# Patient Record
Sex: Female | Born: 1935 | Race: White | Hispanic: No | Marital: Married | Smoking: Never smoker
Health system: Southern US, Community
[De-identification: ages and names within clinical notes are randomized; demographics above are authoritative.]

## PROBLEM LIST (undated history)

## (undated) DIAGNOSIS — K449 Diaphragmatic hernia without obstruction or gangrene: Secondary | ICD-10-CM

## (undated) DIAGNOSIS — Z923 Personal history of irradiation: Secondary | ICD-10-CM

## (undated) DIAGNOSIS — I442 Atrioventricular block, complete: Secondary | ICD-10-CM

## (undated) DIAGNOSIS — F0390 Unspecified dementia without behavioral disturbance: Secondary | ICD-10-CM

## (undated) DIAGNOSIS — T8859XA Other complications of anesthesia, initial encounter: Secondary | ICD-10-CM

## (undated) DIAGNOSIS — K219 Gastro-esophageal reflux disease without esophagitis: Secondary | ICD-10-CM

## (undated) DIAGNOSIS — M1990 Unspecified osteoarthritis, unspecified site: Secondary | ICD-10-CM

## (undated) DIAGNOSIS — T4145XA Adverse effect of unspecified anesthetic, initial encounter: Secondary | ICD-10-CM

## (undated) DIAGNOSIS — Z8489 Family history of other specified conditions: Secondary | ICD-10-CM

## (undated) DIAGNOSIS — R112 Nausea with vomiting, unspecified: Secondary | ICD-10-CM

## (undated) DIAGNOSIS — M316 Other giant cell arteritis: Secondary | ICD-10-CM

## (undated) DIAGNOSIS — K649 Unspecified hemorrhoids: Secondary | ICD-10-CM

## (undated) DIAGNOSIS — K5790 Diverticulosis of intestine, part unspecified, without perforation or abscess without bleeding: Secondary | ICD-10-CM

## (undated) DIAGNOSIS — E049 Nontoxic goiter, unspecified: Secondary | ICD-10-CM

## (undated) DIAGNOSIS — Z973 Presence of spectacles and contact lenses: Secondary | ICD-10-CM

## (undated) DIAGNOSIS — I480 Paroxysmal atrial fibrillation: Secondary | ICD-10-CM

## (undated) DIAGNOSIS — K589 Irritable bowel syndrome without diarrhea: Secondary | ICD-10-CM

## (undated) DIAGNOSIS — J449 Chronic obstructive pulmonary disease, unspecified: Secondary | ICD-10-CM

## (undated) DIAGNOSIS — K222 Esophageal obstruction: Secondary | ICD-10-CM

## (undated) DIAGNOSIS — J189 Pneumonia, unspecified organism: Secondary | ICD-10-CM

## (undated) DIAGNOSIS — C50919 Malignant neoplasm of unspecified site of unspecified female breast: Secondary | ICD-10-CM

## (undated) DIAGNOSIS — C50119 Malignant neoplasm of central portion of unspecified female breast: Secondary | ICD-10-CM

## (undated) DIAGNOSIS — Z9889 Other specified postprocedural states: Secondary | ICD-10-CM

## (undated) DIAGNOSIS — J45909 Unspecified asthma, uncomplicated: Secondary | ICD-10-CM

## (undated) HISTORY — PX: RENAL CYST EXCISION: SUR506

## (undated) HISTORY — DX: Malignant neoplasm of central portion of unspecified female breast: C50.119

## (undated) HISTORY — DX: Malignant neoplasm of unspecified site of unspecified female breast: C50.919

## (undated) HISTORY — PX: CHOLECYSTECTOMY: SHX55

## (undated) HISTORY — PX: COLONOSCOPY: SHX174

## (undated) HISTORY — DX: Nontoxic goiter, unspecified: E04.9

## (undated) HISTORY — PX: HEMORRHOID SURGERY: SHX153

## (undated) HISTORY — DX: Gastro-esophageal reflux disease without esophagitis: K21.9

## (undated) HISTORY — PX: FRACTURE SURGERY: SHX138

## (undated) HISTORY — PX: BREAST BIOPSY: SHX20

## (undated) HISTORY — DX: Irritable bowel syndrome, unspecified: K58.9

## (undated) HISTORY — PX: TONSILLECTOMY: SUR1361

## (undated) HISTORY — DX: Atrioventricular block, complete: I44.2

## (undated) HISTORY — DX: Pneumonia, unspecified organism: J18.9

## (undated) HISTORY — DX: Esophageal obstruction: K22.2

## (undated) HISTORY — DX: Diaphragmatic hernia without obstruction or gangrene: K44.9

## (undated) HISTORY — PX: RECTAL POLYPECTOMY: SHX2309

## (undated) HISTORY — DX: Unspecified hemorrhoids: K64.9

## (undated) HISTORY — DX: Personal history of irradiation: Z92.3

## (undated) HISTORY — DX: Unspecified osteoarthritis, unspecified site: M19.90

## (undated) HISTORY — DX: Paroxysmal atrial fibrillation: I48.0

## (undated) HISTORY — DX: Diverticulosis of intestine, part unspecified, without perforation or abscess without bleeding: K57.90

## (undated) HISTORY — DX: Other giant cell arteritis: M31.6

## (undated) HISTORY — PX: ANAL FISSURE REPAIR: SHX2312

## (undated) HISTORY — PX: MOUTH SURGERY: SHX715

---
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
## 1994-09-30 ENCOUNTER — Encounter: Payer: Self-pay | Admitting: Pulmonary Disease

## 1997-10-19 ENCOUNTER — Encounter: Payer: Self-pay | Admitting: Family Medicine

## 1997-10-19 DIAGNOSIS — I442 Atrioventricular block, complete: Secondary | ICD-10-CM

## 1997-10-19 HISTORY — DX: Atrioventricular block, complete: I44.2

## 1997-10-19 HISTORY — PX: INSERT / REPLACE / REMOVE PACEMAKER: SUR710

## 1998-06-17 ENCOUNTER — Ambulatory Visit (HOSPITAL_COMMUNITY): Admission: RE | Admit: 1998-06-17 | Discharge: 1998-06-17 | Payer: Self-pay | Admitting: Family Medicine

## 1998-07-29 ENCOUNTER — Inpatient Hospital Stay (HOSPITAL_COMMUNITY): Admission: EM | Admit: 1998-07-29 | Discharge: 1998-08-04 | Payer: Self-pay | Admitting: *Deleted

## 1999-03-07 ENCOUNTER — Ambulatory Visit (HOSPITAL_COMMUNITY): Admission: RE | Admit: 1999-03-07 | Discharge: 1999-03-07 | Payer: Self-pay | Admitting: Orthopedic Surgery

## 1999-03-07 ENCOUNTER — Encounter: Payer: Self-pay | Admitting: Orthopedic Surgery

## 1999-05-13 ENCOUNTER — Encounter: Payer: Self-pay | Admitting: Pulmonary Disease

## 1999-05-13 ENCOUNTER — Ambulatory Visit: Admission: RE | Admit: 1999-05-13 | Discharge: 1999-05-13 | Payer: Self-pay | Admitting: Cardiology

## 1999-06-20 ENCOUNTER — Encounter: Payer: Self-pay | Admitting: Pulmonary Disease

## 1999-08-05 ENCOUNTER — Encounter: Payer: Self-pay | Admitting: Pulmonary Disease

## 2000-11-03 ENCOUNTER — Encounter: Payer: Self-pay | Admitting: Pulmonary Disease

## 2000-12-10 ENCOUNTER — Other Ambulatory Visit: Admission: RE | Admit: 2000-12-10 | Discharge: 2000-12-10 | Payer: Self-pay | Admitting: Family Medicine

## 2003-03-28 ENCOUNTER — Encounter: Admission: RE | Admit: 2003-03-28 | Discharge: 2003-03-28 | Payer: Self-pay | Admitting: Orthopedic Surgery

## 2003-03-28 ENCOUNTER — Encounter: Payer: Self-pay | Admitting: Orthopedic Surgery

## 2004-08-27 ENCOUNTER — Ambulatory Visit: Payer: Self-pay | Admitting: Family Medicine

## 2004-09-12 ENCOUNTER — Ambulatory Visit: Payer: Self-pay

## 2004-09-30 ENCOUNTER — Ambulatory Visit: Payer: Self-pay | Admitting: Cardiology

## 2004-11-08 ENCOUNTER — Ambulatory Visit: Payer: Self-pay | Admitting: Cardiology

## 2004-11-13 ENCOUNTER — Ambulatory Visit: Payer: Self-pay | Admitting: Gastroenterology

## 2004-11-14 ENCOUNTER — Ambulatory Visit: Payer: Self-pay | Admitting: Gastroenterology

## 2004-12-04 ENCOUNTER — Ambulatory Visit: Payer: Self-pay | Admitting: Cardiology

## 2004-12-30 ENCOUNTER — Ambulatory Visit: Payer: Self-pay | Admitting: Family Medicine

## 2005-01-02 ENCOUNTER — Ambulatory Visit: Payer: Self-pay | Admitting: Cardiology

## 2005-02-03 ENCOUNTER — Ambulatory Visit: Payer: Self-pay | Admitting: Cardiology

## 2005-02-27 ENCOUNTER — Ambulatory Visit: Payer: Self-pay | Admitting: Family Medicine

## 2005-03-05 ENCOUNTER — Ambulatory Visit: Payer: Self-pay | Admitting: Family Medicine

## 2005-03-13 ENCOUNTER — Ambulatory Visit: Payer: Self-pay | Admitting: Cardiology

## 2005-03-25 ENCOUNTER — Ambulatory Visit: Payer: Self-pay | Admitting: Family Medicine

## 2005-04-27 ENCOUNTER — Ambulatory Visit: Payer: Self-pay | Admitting: Cardiology

## 2005-05-14 ENCOUNTER — Ambulatory Visit: Payer: Self-pay | Admitting: Family Medicine

## 2005-05-26 ENCOUNTER — Ambulatory Visit: Payer: Self-pay | Admitting: Family Medicine

## 2005-05-27 ENCOUNTER — Ambulatory Visit: Payer: Self-pay | Admitting: Cardiology

## 2005-07-02 ENCOUNTER — Ambulatory Visit: Payer: Self-pay | Admitting: Cardiology

## 2005-07-30 ENCOUNTER — Ambulatory Visit: Payer: Self-pay | Admitting: Cardiology

## 2005-08-19 ENCOUNTER — Ambulatory Visit: Payer: Self-pay | Admitting: Family Medicine

## 2005-09-01 ENCOUNTER — Ambulatory Visit: Payer: Self-pay | Admitting: Cardiology

## 2005-09-29 ENCOUNTER — Ambulatory Visit: Payer: Self-pay | Admitting: Family Medicine

## 2005-11-18 ENCOUNTER — Ambulatory Visit: Payer: Self-pay | Admitting: Cardiology

## 2005-12-24 ENCOUNTER — Ambulatory Visit: Payer: Self-pay | Admitting: Cardiology

## 2006-02-01 ENCOUNTER — Ambulatory Visit: Payer: Self-pay | Admitting: Cardiology

## 2006-02-17 ENCOUNTER — Encounter: Payer: Self-pay | Admitting: *Deleted

## 2006-03-08 ENCOUNTER — Ambulatory Visit: Payer: Self-pay | Admitting: Cardiology

## 2006-04-12 ENCOUNTER — Ambulatory Visit: Payer: Self-pay | Admitting: Cardiology

## 2006-05-07 ENCOUNTER — Ambulatory Visit: Payer: Self-pay | Admitting: Cardiology

## 2006-07-02 ENCOUNTER — Ambulatory Visit: Payer: Self-pay | Admitting: Cardiology

## 2006-07-22 ENCOUNTER — Ambulatory Visit: Payer: Self-pay | Admitting: Internal Medicine

## 2006-07-30 ENCOUNTER — Ambulatory Visit: Payer: Self-pay | Admitting: Cardiology

## 2006-08-30 ENCOUNTER — Ambulatory Visit: Payer: Self-pay | Admitting: Cardiology

## 2006-09-27 ENCOUNTER — Ambulatory Visit: Payer: Self-pay | Admitting: Cardiology

## 2006-10-25 ENCOUNTER — Ambulatory Visit: Payer: Self-pay | Admitting: Cardiology

## 2006-11-25 ENCOUNTER — Ambulatory Visit: Payer: Self-pay | Admitting: Cardiology

## 2006-12-20 ENCOUNTER — Ambulatory Visit: Payer: Self-pay | Admitting: Cardiology

## 2007-01-17 ENCOUNTER — Ambulatory Visit: Payer: Self-pay | Admitting: Cardiology

## 2007-02-14 ENCOUNTER — Ambulatory Visit: Payer: Self-pay | Admitting: Cardiology

## 2007-03-17 ENCOUNTER — Ambulatory Visit: Payer: Self-pay | Admitting: Cardiology

## 2007-04-11 ENCOUNTER — Ambulatory Visit: Payer: Self-pay | Admitting: Cardiology

## 2007-05-09 ENCOUNTER — Ambulatory Visit: Payer: Self-pay | Admitting: Cardiology

## 2007-05-12 ENCOUNTER — Encounter: Payer: Self-pay | Admitting: Family Medicine

## 2007-05-12 DIAGNOSIS — E049 Nontoxic goiter, unspecified: Secondary | ICD-10-CM

## 2007-05-12 DIAGNOSIS — J42 Unspecified chronic bronchitis: Secondary | ICD-10-CM | POA: Insufficient documentation

## 2007-05-12 DIAGNOSIS — M159 Polyosteoarthritis, unspecified: Secondary | ICD-10-CM

## 2007-05-12 DIAGNOSIS — K589 Irritable bowel syndrome without diarrhea: Secondary | ICD-10-CM

## 2007-05-13 ENCOUNTER — Ambulatory Visit: Payer: Self-pay | Admitting: Family Medicine

## 2007-05-13 DIAGNOSIS — J3089 Other allergic rhinitis: Secondary | ICD-10-CM | POA: Insufficient documentation

## 2007-06-06 ENCOUNTER — Ambulatory Visit: Payer: Self-pay | Admitting: Cardiology

## 2007-07-04 ENCOUNTER — Ambulatory Visit: Payer: Self-pay | Admitting: Cardiology

## 2007-07-18 DIAGNOSIS — S139XXA Sprain of joints and ligaments of unspecified parts of neck, initial encounter: Secondary | ICD-10-CM

## 2007-07-25 ENCOUNTER — Ambulatory Visit: Payer: Self-pay | Admitting: Family Medicine

## 2007-07-25 LAB — CONVERTED CEMR LAB
ALT: 33 units/L (ref 0–35)
AST: 25 units/L (ref 0–37)
Alkaline Phosphatase: 183 units/L — ABNORMAL HIGH (ref 39–117)
BUN: 10 mg/dL (ref 6–23)
Basophils Absolute: 0 10*3/uL (ref 0.0–0.1)
Basophils Relative: 0.1 % (ref 0.0–1.0)
Blood in Urine, dipstick: NEGATIVE
CO2: 32 meq/L (ref 19–32)
Eosinophils Absolute: 0.2 10*3/uL (ref 0.0–0.6)
GFR calc Af Amer: 124 mL/min
Glucose, Bld: 93 mg/dL (ref 70–99)
HDL: 62.7 mg/dL (ref >39.0)
Hemoglobin: 12.2 g/dL (ref 12.0–15.0)
Neutro Abs: 7.8 10*3/uL — ABNORMAL HIGH (ref 1.4–7.7)
Neutrophils Relative %: 79.1 % — ABNORMAL HIGH (ref 43.0–77.0)
Nitrite: NEGATIVE
RBC: 4.09 M/uL (ref 3.87–5.11)
Sodium: 142 meq/L (ref 135–145)
Specific Gravity, Urine: 1.01
TSH: 0.91 microintl units/mL (ref 0.35–5.50)
Total Bilirubin: 0.7 mg/dL (ref 0.3–1.2)
Total CHOL/HDL Ratio: 3.1
Total Protein: 6.5 g/dL (ref 6.0–8.3)
Triglycerides: 69 mg/dL (ref 0–149)
VLDL: 14 mg/dL (ref 0–40)
WBC: 9.8 10*3/uL (ref 4.5–10.5)

## 2007-07-27 ENCOUNTER — Ambulatory Visit: Payer: Self-pay | Admitting: Family Medicine

## 2007-08-01 ENCOUNTER — Ambulatory Visit: Payer: Self-pay | Admitting: Cardiology

## 2007-08-05 ENCOUNTER — Ambulatory Visit: Payer: Self-pay | Admitting: Family Medicine

## 2007-08-05 ENCOUNTER — Encounter: Admission: RE | Admit: 2007-08-05 | Discharge: 2007-08-05 | Payer: Self-pay | Admitting: Family Medicine

## 2007-08-08 ENCOUNTER — Ambulatory Visit: Payer: Self-pay | Admitting: Family Medicine

## 2007-08-09 ENCOUNTER — Encounter: Admission: RE | Admit: 2007-08-09 | Discharge: 2007-10-04 | Payer: Self-pay | Admitting: Family Medicine

## 2007-08-16 ENCOUNTER — Ambulatory Visit: Payer: Self-pay | Admitting: Family Medicine

## 2007-08-29 ENCOUNTER — Ambulatory Visit: Payer: Self-pay | Admitting: Cardiology

## 2007-09-26 ENCOUNTER — Ambulatory Visit: Payer: Self-pay | Admitting: Cardiology

## 2007-12-13 ENCOUNTER — Telehealth: Payer: Self-pay | Admitting: Family Medicine

## 2008-01-05 ENCOUNTER — Ambulatory Visit: Payer: Self-pay | Admitting: Cardiology

## 2008-01-11 ENCOUNTER — Telehealth: Payer: Self-pay | Admitting: Family Medicine

## 2008-02-16 ENCOUNTER — Ambulatory Visit: Payer: Self-pay | Admitting: Cardiology

## 2008-04-16 ENCOUNTER — Ambulatory Visit: Payer: Self-pay | Admitting: Family Medicine

## 2008-04-16 DIAGNOSIS — L578 Other skin changes due to chronic exposure to nonionizing radiation: Secondary | ICD-10-CM | POA: Insufficient documentation

## 2008-04-30 ENCOUNTER — Telehealth (INDEPENDENT_AMBULATORY_CARE_PROVIDER_SITE_OTHER): Payer: Self-pay | Admitting: *Deleted

## 2008-05-10 ENCOUNTER — Ambulatory Visit: Payer: Self-pay

## 2008-05-16 ENCOUNTER — Ambulatory Visit: Payer: Self-pay | Admitting: Internal Medicine

## 2008-05-16 DIAGNOSIS — S59909A Unspecified injury of unspecified elbow, initial encounter: Secondary | ICD-10-CM

## 2008-05-16 DIAGNOSIS — S59919A Unspecified injury of unspecified forearm, initial encounter: Secondary | ICD-10-CM

## 2008-05-16 DIAGNOSIS — S2341XA Sprain of ribs, initial encounter: Secondary | ICD-10-CM

## 2008-05-16 DIAGNOSIS — S6990XA Unspecified injury of unspecified wrist, hand and finger(s), initial encounter: Secondary | ICD-10-CM

## 2008-05-17 ENCOUNTER — Ambulatory Visit: Payer: Self-pay | Admitting: Internal Medicine

## 2008-05-22 DIAGNOSIS — J069 Acute upper respiratory infection, unspecified: Secondary | ICD-10-CM | POA: Insufficient documentation

## 2008-05-25 ENCOUNTER — Ambulatory Visit: Payer: Self-pay | Admitting: Family Medicine

## 2008-06-15 ENCOUNTER — Ambulatory Visit: Payer: Self-pay | Admitting: Cardiology

## 2008-07-05 ENCOUNTER — Encounter: Payer: Self-pay | Admitting: Internal Medicine

## 2008-07-11 ENCOUNTER — Ambulatory Visit: Payer: Self-pay

## 2008-08-01 ENCOUNTER — Ambulatory Visit: Payer: Self-pay | Admitting: Cardiology

## 2008-08-01 ENCOUNTER — Ambulatory Visit: Payer: Self-pay

## 2008-08-01 LAB — CONVERTED CEMR LAB
Basophils Absolute: 0 10*3/uL (ref 0.0–0.1)
Basophils Relative: 0.4 % (ref 0.0–3.0)
Chloride: 102 meq/L (ref 96–112)
Creatinine, Ser: 0.6 mg/dL (ref 0.4–1.2)
Eosinophils Absolute: 0.2 10*3/uL (ref 0.0–0.7)
Eosinophils Relative: 3.1 % (ref 0.0–5.0)
GFR calc Af Amer: 124 mL/min
MCV: 92.4 fL (ref 78.0–100.0)
Magnesium: 2.2 mg/dL (ref 1.5–2.5)
Monocytes Relative: 11.9 % (ref 3.0–12.0)
TSH: 0.5 microintl units/mL (ref 0.35–5.50)
WBC: 5.9 10*3/uL (ref 4.5–10.5)

## 2008-08-02 ENCOUNTER — Telehealth: Payer: Self-pay | Admitting: Family Medicine

## 2008-08-02 ENCOUNTER — Ambulatory Visit: Payer: Self-pay | Admitting: Family Medicine

## 2008-09-03 ENCOUNTER — Ambulatory Visit: Payer: Self-pay | Admitting: Cardiology

## 2008-09-03 LAB — CONVERTED CEMR LAB
Basophils Absolute: 0.1 10*3/uL (ref 0.0–0.1)
CO2: 32 meq/L (ref 19–32)
Calcium: 9.3 mg/dL (ref 8.4–10.5)
Eosinophils Relative: 3 % (ref 0.0–5.0)
GFR calc Af Amer: 124 mL/min
GFR calc non Af Amer: 102 mL/min
Glucose, Bld: 80 mg/dL (ref 70–99)
HCT: 39.9 % (ref 36.0–46.0)
Hemoglobin: 13.8 g/dL (ref 12.0–15.0)
INR: 1 (ref 0.8–1.0)
Lymphocytes Relative: 22.2 % (ref 12.0–46.0)
MCHC: 34.7 g/dL (ref 30.0–36.0)
Neutro Abs: 4.4 10*3/uL (ref 1.4–7.7)
Platelets: 267 10*3/uL (ref 150–400)
RBC: 4.37 M/uL (ref 3.87–5.11)
WBC: 6.9 10*3/uL (ref 4.5–10.5)
aPTT: 30.2 s — ABNORMAL HIGH (ref 21.7–29.8)

## 2008-09-06 ENCOUNTER — Ambulatory Visit: Payer: Self-pay | Admitting: Cardiology

## 2008-09-06 ENCOUNTER — Ambulatory Visit (HOSPITAL_COMMUNITY): Admission: RE | Admit: 2008-09-06 | Discharge: 2008-09-06 | Payer: Self-pay | Admitting: Cardiology

## 2008-09-07 DIAGNOSIS — Z950 Presence of cardiac pacemaker: Secondary | ICD-10-CM

## 2008-09-07 DIAGNOSIS — I442 Atrioventricular block, complete: Secondary | ICD-10-CM

## 2008-09-17 ENCOUNTER — Ambulatory Visit: Payer: Self-pay

## 2008-09-27 ENCOUNTER — Ambulatory Visit: Payer: Self-pay | Admitting: Cardiology

## 2008-12-07 ENCOUNTER — Ambulatory Visit: Payer: Self-pay | Admitting: Family Medicine

## 2008-12-07 ENCOUNTER — Encounter: Payer: Self-pay | Admitting: *Deleted

## 2008-12-07 DIAGNOSIS — M722 Plantar fascial fibromatosis: Secondary | ICD-10-CM

## 2008-12-13 ENCOUNTER — Encounter: Payer: Self-pay | Admitting: Family Medicine

## 2008-12-14 ENCOUNTER — Ambulatory Visit: Payer: Self-pay | Admitting: Cardiology

## 2009-01-17 ENCOUNTER — Encounter: Payer: Self-pay | Admitting: Family Medicine

## 2009-01-24 ENCOUNTER — Ambulatory Visit: Payer: Self-pay | Admitting: Family Medicine

## 2009-01-28 ENCOUNTER — Encounter: Payer: Self-pay | Admitting: Family Medicine

## 2009-02-07 ENCOUNTER — Encounter (INDEPENDENT_AMBULATORY_CARE_PROVIDER_SITE_OTHER): Payer: Self-pay | Admitting: *Deleted

## 2009-03-08 ENCOUNTER — Encounter (INDEPENDENT_AMBULATORY_CARE_PROVIDER_SITE_OTHER): Payer: Self-pay | Admitting: *Deleted

## 2009-03-15 ENCOUNTER — Ambulatory Visit: Payer: Self-pay | Admitting: Cardiology

## 2009-06-14 ENCOUNTER — Ambulatory Visit: Payer: Self-pay | Admitting: Cardiology

## 2009-07-18 ENCOUNTER — Ambulatory Visit: Payer: Self-pay | Admitting: Family Medicine

## 2009-08-21 DIAGNOSIS — J449 Chronic obstructive pulmonary disease, unspecified: Secondary | ICD-10-CM

## 2009-08-22 ENCOUNTER — Ambulatory Visit: Payer: Self-pay | Admitting: Cardiology

## 2009-08-22 ENCOUNTER — Encounter: Payer: Self-pay | Admitting: Cardiology

## 2009-08-22 DIAGNOSIS — R0602 Shortness of breath: Secondary | ICD-10-CM

## 2009-08-26 ENCOUNTER — Ambulatory Visit: Payer: Self-pay | Admitting: Family Medicine

## 2009-08-30 LAB — CONVERTED CEMR LAB
Basophils Absolute: 0 10*3/uL (ref 0.0–0.1)
Basophils Relative: 0.6 % (ref 0.0–3.0)
CO2: 33 meq/L — ABNORMAL HIGH (ref 19–32)
Chloride: 101 meq/L (ref 96–112)
Eosinophils Absolute: 0.2 10*3/uL (ref 0.0–0.7)
Eosinophils Relative: 3.1 % (ref 0.0–5.0)
GFR calc non Af Amer: 101.74 mL/min (ref >60)
Hemoglobin: 13.9 g/dL (ref 12.0–15.0)
MCV: 90.2 fL (ref 78.0–100.0)
Neutro Abs: 3.7 10*3/uL (ref 1.4–7.7)
Neutrophils Relative %: 60.5 % (ref 43.0–77.0)
Pro B Natriuretic peptide (BNP): 49 pg/mL (ref 0.0–100.0)
RBC: 4.53 M/uL (ref 3.87–5.11)
RDW: 12.6 % (ref 11.5–14.6)

## 2009-09-05 ENCOUNTER — Encounter: Payer: Self-pay | Admitting: Cardiology

## 2009-09-05 ENCOUNTER — Ambulatory Visit: Payer: Self-pay

## 2009-09-05 ENCOUNTER — Ambulatory Visit: Payer: Self-pay | Admitting: Cardiology

## 2009-09-05 ENCOUNTER — Ambulatory Visit (HOSPITAL_COMMUNITY): Admission: RE | Admit: 2009-09-05 | Discharge: 2009-09-05 | Payer: Self-pay | Admitting: Cardiology

## 2009-09-20 ENCOUNTER — Ambulatory Visit: Payer: Self-pay | Admitting: Cardiology

## 2009-09-20 DIAGNOSIS — I471 Supraventricular tachycardia: Secondary | ICD-10-CM

## 2009-10-23 ENCOUNTER — Ambulatory Visit: Payer: Self-pay | Admitting: Cardiology

## 2009-11-27 ENCOUNTER — Telehealth: Payer: Self-pay | Admitting: Family Medicine

## 2010-01-17 ENCOUNTER — Ambulatory Visit: Payer: Self-pay | Admitting: Family Medicine

## 2010-01-17 LAB — CONVERTED CEMR LAB
Bilirubin Urine: NEGATIVE
Ketones, urine, test strip: NEGATIVE
Nitrite: NEGATIVE

## 2010-01-20 LAB — CONVERTED CEMR LAB
AST: 27 units/L (ref 0–37)
Albumin: 4.1 g/dL (ref 3.5–5.2)
BUN: 7 mg/dL (ref 6–23)
Bilirubin, Direct: 0 mg/dL (ref 0.0–0.3)
CO2: 31 meq/L (ref 19–32)
Calcium: 9.7 mg/dL (ref 8.4–10.5)
Eosinophils Absolute: 0.1 10*3/uL (ref 0.0–0.7)
Eosinophils Relative: 2 % (ref 0.0–5.0)
GFR calc non Af Amer: 85.07 mL/min (ref >60)
Hemoglobin: 14.4 g/dL (ref 12.0–15.0)
Lymphocytes Relative: 22.5 % (ref 12.0–46.0)
Lymphs Abs: 1.3 10*3/uL (ref 0.7–4.0)
Monocytes Absolute: 0.6 10*3/uL (ref 0.1–1.0)
Potassium: 4.2 meq/L (ref 3.5–5.1)
RBC: 4.7 M/uL (ref 3.87–5.11)
TSH: 1.29 microintl units/mL (ref 0.35–5.50)
Total Protein: 7.1 g/dL (ref 6.0–8.3)
WBC: 5.9 10*3/uL (ref 4.5–10.5)

## 2010-03-04 ENCOUNTER — Ambulatory Visit: Payer: Self-pay | Admitting: Cardiology

## 2010-06-03 ENCOUNTER — Ambulatory Visit: Payer: Self-pay | Admitting: Cardiology

## 2010-07-10 ENCOUNTER — Ambulatory Visit: Payer: Self-pay | Admitting: Family Medicine

## 2010-08-07 ENCOUNTER — Encounter: Payer: Self-pay | Admitting: Family Medicine

## 2010-08-07 DIAGNOSIS — R413 Other amnesia: Secondary | ICD-10-CM | POA: Insufficient documentation

## 2010-08-13 ENCOUNTER — Encounter: Payer: Self-pay | Admitting: Family Medicine

## 2010-08-15 ENCOUNTER — Encounter (INDEPENDENT_AMBULATORY_CARE_PROVIDER_SITE_OTHER): Payer: Self-pay | Admitting: *Deleted

## 2010-09-02 ENCOUNTER — Ambulatory Visit: Payer: Self-pay | Admitting: Cardiology

## 2010-09-16 ENCOUNTER — Ambulatory Visit: Payer: Self-pay | Admitting: Family Medicine

## 2010-09-16 DIAGNOSIS — J45901 Unspecified asthma with (acute) exacerbation: Secondary | ICD-10-CM | POA: Insufficient documentation

## 2010-09-30 ENCOUNTER — Telehealth: Payer: Self-pay | Admitting: Family Medicine

## 2010-10-07 ENCOUNTER — Telehealth: Payer: Self-pay | Admitting: Family Medicine

## 2010-10-07 ENCOUNTER — Ambulatory Visit: Payer: Self-pay | Admitting: Family Medicine

## 2010-10-14 ENCOUNTER — Encounter: Payer: Self-pay | Admitting: Cardiology

## 2010-10-14 ENCOUNTER — Ambulatory Visit: Payer: Self-pay | Admitting: Cardiology

## 2010-10-14 ENCOUNTER — Other Ambulatory Visit: Payer: Self-pay | Admitting: Cardiology

## 2010-10-14 DIAGNOSIS — I498 Other specified cardiac arrhythmias: Secondary | ICD-10-CM

## 2010-10-21 ENCOUNTER — Telehealth (INDEPENDENT_AMBULATORY_CARE_PROVIDER_SITE_OTHER): Payer: Self-pay | Admitting: *Deleted

## 2010-10-23 ENCOUNTER — Ambulatory Visit: Admit: 2010-10-23 | Payer: Self-pay | Admitting: Internal Medicine

## 2010-10-24 LAB — T3, FREE: T3, Free: 3 pg/mL (ref 2.3–4.2)

## 2010-10-24 LAB — T4, FREE: Free T4: 0.64 ng/dL (ref 0.60–1.60)

## 2010-10-28 ENCOUNTER — Ambulatory Visit
Admission: RE | Admit: 2010-10-28 | Discharge: 2010-10-28 | Payer: Self-pay | Source: Home / Self Care | Attending: Pulmonary Disease | Admitting: Pulmonary Disease

## 2010-11-03 ENCOUNTER — Telehealth: Payer: Self-pay | Admitting: Internal Medicine

## 2010-11-18 ENCOUNTER — Ambulatory Visit
Admission: RE | Admit: 2010-11-18 | Discharge: 2010-11-18 | Payer: Self-pay | Source: Home / Self Care | Attending: Pulmonary Disease | Admitting: Pulmonary Disease

## 2010-11-18 ENCOUNTER — Encounter: Payer: Self-pay | Admitting: Pulmonary Disease

## 2010-11-18 NOTE — Progress Notes (Signed)
Summary: refill  Phone Note Refill Request Message from:  Fax from Pharmacy  Refills Requested: Medication #1:  METOPROLOL SUCCINATE 100 MG XR24H-TAB take one half tab by mouth evey morning. [facility] [employer] [phone] [phone]  Initial call taken by: Warnell Forester,  November 27, 2009 8:16 AM    Prescriptions: METOPROLOL SUCCINATE 100 MG XR24H-TAB (METOPROLOL SUCCINATE) take one half tab by mouth evey morning  #60 x 3   Entered by:   Kern Reap CMA (AAMA)   Authorized by:   Roderick Pee MD   Signed by:   Kern Reap CMA (AAMA) on 11/27/2009   Method used:   Electronically to        [facility] [address] (retail)       [address]       [place], [place]  [address]       Ph: [phone]       Fax: [phone]   RxID:   [id number]

## 2010-11-18 NOTE — Miscellaneous (Signed)
Summary: dx correction  Clinical Lists Changes  Problems: Changed problem from PACEMAKER (ICD-V45.Marland Kitchen01) to PACEMAKER, PERMANENT (ICD-V45.01)  changed the incorrect dx code to correct dx code Genella Mech  August 15, 2010 1:41 PM

## 2010-11-18 NOTE — Letter (Signed)
Summary: Referral for Memory Loss  Referral for Memory Loss   Imported By: Maryln Gottron 09/03/2010 10:50:53  _____________________________________________________________________  External Attachment:    Type:   Image     Comment:   External Document

## 2010-11-18 NOTE — Cardiovascular Report (Signed)
Summary: Transtelephonic Pacemaker Monitoring Report  Transtelephonic Pacemaker Monitoring Report   Imported By: Marylou Mccoy 06/12/2010 10:24:58  _____________________________________________________________________  External Attachment:    Type:   Image     Comment:   External Document

## 2010-11-18 NOTE — Assessment & Plan Note (Signed)
Summary: ?uri/njr 3pm/njr   Vital Signs:  Patient profile:   77 year old female Height:      59.5 inches Weight:      123 pounds BMI:     24.52 Temp:     98.2 degrees F oral BP sitting:   130 / 80  (left arm) Cuff size:   regular  Vitals Entered By: Kern Reap CMA Duncan Dull) (September 16, 2010 3:14 PM) CC: sinus and chest congestion   Primary Care Provider:  Dr. Tawanna Cooler  CC:  sinus and chest congestion.  History of Present Illness: Kyia is an 77 year old, married female, nonsmoker with underlying allergic rhinitis.  He comes in today with a 4-week history of head congestion, postnasal drip, and cough that will not go away with over-the-counter antihistamines.  Review of systems otherwise negative.  Environmental review of systems negative  Allergies: 1)  ! * Cat Scan Dye 2)  ! * "strong Abx"  Past History:  Past medical, surgical, family and social histories (including risk factors) reviewed for relevance to current acute and chronic problems.  Past Medical History: Reviewed history from 09/07/2008 and no changes required. DJD IBS Goiter Chronic Bronchitis pacemaker 1999 childbirth x 4 gallbladder removal lobectomy because of bronchiectasis hemorrhoidectomy renal cyst  1. Palpitations and documented mode switches probably related to     paroxysmal atrial fibrillation. 2. Status post St. Jude Affinity DDD pacemaker implantation in 1999     for second-degree AV block with recent generator change 11/09 3. Previous pocket stimulation in a unipolar mode resolved with     bipolar pacing. 4. Chronic obstructive pulmonary disease with a partial lobectomy for     bronchiectasis and FEV-1 of 1.0.   Past Surgical History: Reviewed history from 05/16/2008 and no changes required. Pacemaker GB Lobectomy Bronchietasis left hemorrhoidectomy renal cyst  Family History: Reviewed history and no changes required.  Social History: Reviewed history from 04/16/2008 and no  changes required. Retired Married Never Smoked Alcohol use-no Drug use-no Regular exercise-yes  Review of Systems      See HPI  Physical Exam  General:  Well-developed,well-nourished,in no acute distress; alert,appropriate and cooperative throughout examination Head:  Normocephalic and atraumatic without obvious abnormalities. No apparent alopecia or balding. Eyes:  No corneal or conjunctival inflammation noted. EOMI. Perrla. Funduscopic exam benign, without hemorrhages, exudates or papilledema. Vision grossly normal. Ears:  External ear exam shows no significant lesions or deformities.  Otoscopic examination reveals clear canals, tympanic membranes are intact bilaterally without bulging, retraction, inflammation or discharge. Hearing is grossly normal bilaterally. Nose:  External nasal examination shows no deformity or inflammation. Nasal mucosa are pink and moist without lesions or exudates. Mouth:  Oral mucosa and oropharynx without lesions or exudates.  Teeth in good repair. Neck:  No deformities, masses, or tenderness noted. Chest Wall:  No deformities, masses, or tenderness noted. Breasts:  No mass, nodules, thickening, tenderness, bulging, retraction, inflamation, nipple discharge or skin changes noted.   Lungs:  symmetrical breath sounds, late expiratory wheezing on forced expiration   Problems:  Medical Problems Added: 1)  Dx of Asthma, Acute  (ZOX-096.04)  Impression & Recommendations:  Problem # 1:  ASTHMA, ACUTE (VWU-981.19) Assessment New  Her updated medication list for this problem includes:    Prednisone 20 Mg Tabs (Prednisone) ..... Uad  Orders: Prescription Created Electronically [phone])  Complete Medication List: 1)  Glucosamine  .... Take 1 tablet by mouth once a day 2)  Levsin/sl 0.125 Mg Subl (Hyoscyamine sulfate) .... As needed  3)  Metoprolol Succinate 100 Mg Xr24h-tab (Metoprolol succinate) .... Take one half tab by mouth evey morning 4)  Omega-3  350 Mg Caps (Omega-3 fatty acids) .... Once daily 5)  Citracal Petites/vitamin D 200-250 Mg-unit Tabs (Calcium citrate-vitamin d) .... Once daily 6)  Vitamin D3 2000 Unit Caps (Cholecalciferol) .... Once daily 7)  Vitamin B-12 500 Mcg Tabs (Cyanocobalamin) .... Once daily 8)  Vitamin C 500 Mg Tabs (Ascorbic acid) .... Once daily 9)  Prednisone 20 Mg Tabs (Prednisone) .... Uad  Patient Instructions: 1)  begin prednisone, take two tabs x 3 days, one x 3 days, a half x 3 days, then half a tablet Monday, Wednesday, Friday, for a two week taper.  While  taking the prednisone, you can stop the Claritin or Zyrtec.  Once u  stop the prednisone.  u may restart the antihistamine Prescriptions: PREDNISONE 20 MG TABS (PREDNISONE) UAD  #30 x 1   Entered and Authorized by:   Roderick Pee MD   Signed by:   Roderick Pee MD on 09/16/2010   Method used:   Electronically to        [facility] [address] (retail)       [address]       [place], [place]  [address]       Ph: [phone]       Fax: [phone]   RxID:   [id number]    Orders Added: 1)  Prescription Created Electronically [G8553] 2)  Est. Patient Level III [27253]

## 2010-11-18 NOTE — Assessment & Plan Note (Signed)
Summary: emp---will fast//ccm   Vital Signs:  Patient profile:   77 year old female Height:      59.5 inches Weight:      123 pounds Temp:     97.4 degrees F oral BP sitting:   110 / 80  (left arm) Cuff size:   regular  Vitals Entered By: Kern Reap CMA Duncan Dull) (January 17, 2010 9:36 AM) CC: cpx Is Patient Diabetic? No Pain Assessment Patient in pain? no        Primary Care Provider:  Dr. Tawanna Cooler  CC:  cpx.  History of Present Illness: Lauren Buchanan is 77 year old, married female, nonsmoker, who comes in today for evaluation of multiple issues.  She has a history of underlying reflux esophagitis/IBS .  She's had a GI evaluation by Dr. Russella Dar.  He put her on proton X. however, she can afford to buy it.  Advised to switch to Prilosec OTC b.i.d..  She also takes Levsin .125 q.i.d. p.r.n.  She takes a beta-blocker 50 mg daily for history of SVT and PAT.  She is in sinus rhythm.  No tachycardia arrhythmias.  She does have a history of a pacemaker followed carefully by cardiology.  She gets routine eye care.  Dental care at age 89 does not require mammography.  Colonoscopy done previously, normal.  Tetanus booster 2008 seasonal flu shot 2010 Pneumovax 1999.  We will give her a Pneumovax booster today.  Encouraged shingles vaccine her past medical history, social history, family history reviewed in detail in the been no significant changes.  She is not able walk everyday because of severe allergies.  Her mood is good.  Hearing normal ADLs.  Normal risk for fall.  Minimal.  Home safety reviewed normal height, weight, and vision, unchanged.  Appropriate laboratory data will be done today.  She has 3 children here in town.  She has done her personal healthcare power of attorney.  She also has been designated no code blue  Allergies: 1)  ! * Cat Scan Dye 2)  ! * "strong Abx"  Past History:  Past medical, surgical, family and social histories (including risk factors) reviewed, and no changes  noted (except as noted below).  Past Medical History: Reviewed history from 09/07/2008 and no changes required. DJD IBS Goiter Chronic Bronchitis pacemaker 1999 childbirth x 4 gallbladder removal lobectomy because of bronchiectasis hemorrhoidectomy renal cyst  1. Palpitations and documented mode switches probably related to     paroxysmal atrial fibrillation. 2. Status post [employer]. Jude Affinity DDD pacemaker implantation in 1999     for second-degree AV block with recent generator change 11/09 3. Previous pocket stimulation in a unipolar mode resolved with     bipolar pacing. 4. Chronic obstructive pulmonary disease with a partial lobectomy for     bronchiectasis and FEV-1 of 1.0.   Past Surgical History: Reviewed history from 05/16/2008 and no changes required. Pacemaker GB Lobectomy Bronchietasis left hemorrhoidectomy renal cyst  Family History: Reviewed history and no changes required.  Social History: Reviewed history from 04/16/2008 and no changes required. Retired Married Never Smoked Alcohol use-no Drug use-no Regular exercise-yes  Review of Systems      See HPI  Physical Exam  General:  Well-developed,well-nourished,in no acute distress; alert,appropriate and cooperative throughout examination Head:  Normocephalic and atraumatic without obvious abnormalities. No apparent alopecia or balding. Eyes:  No corneal or conjunctival inflammation noted. EOMI. Perrla. Funduscopic exam benign, without hemorrhages, exudates or papilledema. Vision grossly normal. Ears:  External ear exam  shows no significant lesions or deformities.  Otoscopic examination reveals clear canals, tympanic membranes are intact bilaterally without bulging, retraction, inflammation or discharge. Hearing is grossly normal bilaterally. Nose:  External nasal examination shows no deformity or inflammation. Nasal mucosa are pink and moist without lesions or exudates. Mouth:  Oral mucosa and  oropharynx without lesions or exudates.  Teeth in good repair. Neck:  No deformities, masses, or tenderness noted. Chest Wall:  No deformities, masses, or tenderness noted. Breasts:  No mass, nodules, thickening, tenderness, bulging, retraction, inflamation, nipple discharge or skin changes noted.   Lungs:  Normal respiratory effort, chest expands symmetrically. Lungs are clear to auscultation, no crackles or wheezes. Heart:  Normal rate and regular rhythm. S1 and S2 normal without gallop, murmur, click, rub or other extra sounds. Abdomen:  Bowel sounds positive,abdomen soft and non-tender without masses, organomegaly or hernias noted. Rectal:  No external abnormalities noted. Normal sphincter tone. No rectal masses or tenderness. Msk:  No deformity or scoliosis noted of thoracic or lumbar spine.   Pulses:  R and L carotid,radial,femoral,dorsalis pedis and posterior tibial pulses are full and equal bilaterally Extremities:  No clubbing, cyanosis, edema, or deformity noted with normal full range of motion of all joints.   Neurologic:  No cranial nerve deficits noted. Station and gait are normal. Plantar reflexes are down-going bilaterally. DTRs are symmetrical throughout. Sensory, motor and coordinative functions appear intact. Skin:  Intact without suspicious lesions or rashes Cervical Nodes:  No lymphadenopathy noted Axillary Nodes:  No palpable lymphadenopathy Inguinal Nodes:  No significant adenopathy Psych:  Cognition and judgment appear intact. Alert and cooperative with normal attention span and concentration. No apparent delusions, illusions, hallucinations   Impression & Recommendations:  Problem # 1:  SVT/ PSVT/ PAT (ICD-427.0) Assessment Improved  Her updated medication list for this problem includes:    Metoprolol Succinate 100 Mg Xr24h-tab (Metoprolol succinate) .Marland Kitchen... Take one half tab by mouth evey morning  Orders: Prescription Created Electronically [phone]) Subsequent  annual wellness visit with prevention plan (U0454) UA Dipstick w/o Micro (automated)  (81003) Venipuncture ([address]) TLB-BMP (Basic Metabolic Panel-BMET) (80048-METABOL) TLB-CBC Platelet - w/Differential (85025-CBCD) TLB-Hepatic/Liver Function Pnl (80076-HEPATIC) TLB-TSH (Thyroid Stimulating Hormone) (84443-TSH) EKG w/ Interpretation (93000)  Problem # 2:  IBS (ICD-564.1) Assessment: Improved  Orders: Prescription Created Electronically [phone]) Subsequent annual wellness visit with prevention plan (G9562) UA Dipstick w/o Micro (automated)  (81003) Venipuncture (13086) TLB-BMP (Basic Metabolic Panel-BMET) (80048-METABOL) TLB-CBC Platelet - w/Differential (85025-CBCD) TLB-Hepatic/Liver Function Pnl (80076-HEPATIC) TLB-TSH (Thyroid Stimulating Hormone) (84443-TSH)  Problem # 3:  Preventive Health Care (ICD-V70.0) Assessment: Unchanged  Orders: Prescription Created Electronically [phone]) Subsequent annual wellness visit with prevention plan (N6295) UA Dipstick w/o Micro (automated)  (81003) Venipuncture (28413) TLB-BMP (Basic Metabolic Panel-BMET) (80048-METABOL) TLB-CBC Platelet - w/Differential (85025-CBCD) TLB-Hepatic/Liver Function Pnl (80076-HEPATIC) TLB-TSH (Thyroid Stimulating Hormone) (84443-TSH)  Complete Medication List: 1)  Glucosamine  .... Take 1 tablet by mouth once a day 2)  Levsin/sl 0.125 Mg Subl (Hyoscyamine sulfate) .... As needed 3)  Metoprolol Succinate 100 Mg Xr24h-tab (Metoprolol succinate) .... Take one half tab by mouth evey morning 4)  Omega-3 350 Mg Caps (Omega-3 fatty acids) .... Once daily 5)  Citracal Petites/vitamin D 200-250 Mg-unit Tabs (Calcium citrate-vitamin d) .... Once daily 6)  Vitamin D3 2000 Unit Caps (Cholecalciferol) .... Once daily 7)  Vitamin B-12 500 Mcg Tabs (Cyanocobalamin) .... Once daily 8)  Vitamin C 500 Mg Tabs (Ascorbic acid) .... Once daily  Other Orders: Pneumococcal Vaccine (24401) Admin 1st Vaccine (02725)  Patient  Instructions: 1)  Please schedule a follow-up appointment in 1 year. 2)  I would recommend you take Prilosec and OTC 20 mg b.i.d. for your abdominal discomfort Prescriptions: METOPROLOL SUCCINATE 100 MG XR24H-TAB (METOPROLOL SUCCINATE) take one half tab by mouth evey morning  #60 x 3   Entered and Authorized by:   Roderick Pee MD   Signed by:   Roderick Pee MD on 01/17/2010   Method used:   Electronically to        [facility] [address] (retail)       [address]       [place], [place]  [address]       Ph: [phone]       Fax: [phone]   RxID:   [id number] LEVSIN/SL 0.125 MG  SUBL (HYOSCYAMINE SULFATE) as needed  #100 x 4   Entered and Authorized by:   Roderick Pee MD   Signed by:   Roderick Pee MD on 01/17/2010   Method used:   Electronically to        [facility] [address] (retail)       [address]       [place], [place]  [address]       Ph: [phone]       Fax: [phone]   RxID:   [id number]    Immunizations Administered:  Pneumonia Vaccine:    Vaccine Type: Pneumovax    Site: left deltoid    Mfr: Merck    Dose: 0.5 ml    Route: IM    Given by: Kern Reap CMA (AAMA)    Exp. Date: 09/28/2010    Lot #: [id number]    Physician counseled: yes    Laboratory Results   Urine Tests  Date/Time Recieved: January 17, 2010 12:11 PM  Date/Time Reported: January 17, 2010 12:11 PM   Routine Urinalysis   Color: yellow Appearance: Clear Glucose: negative   (Normal Range: Negative) Bilirubin: negative   (Normal Range: Negative) Ketone: negative   (Normal Range: Negative) Spec. Gravity: 1.010   (Normal Range: 1.003-1.035) Blood: trace-intact   (Normal Range: Negative) pH: 7.0   (Normal Range: 5.0-8.0) Protein: negative   (Normal Range: Negative) Urobilinogen: 0.2   (Normal Range: 0-1) Nitrite: negative   (Normal Range: Negative) Leukocyte Esterace: 2+   (Normal  Range: Negative)    Comments: Wynona Canes, CMA  January 17, 2010 12:11 PM

## 2010-11-18 NOTE — Assessment & Plan Note (Signed)
Summary: STIFFNECK/MHF   Vital Signs:  Patient Profile:   77 Years Old Female Height:     58.5 inches (148.59 cm) Weight:      120 pounds (54.55 kg) Temp:     98.2 degrees F (36.78 degrees C) oral BP sitting:   128 / 84  (left arm)  Pt. in pain?   yes    Location:   neck    Intensity:   9    Type:       aching  Vitals Entered By: Arcola Jansky, RN (July 27, 2007 11:18 AM)                  Chief Complaint:  "stiff neck for a wk and a half".  History of Present Illness: Lauren Buchanan is a 77 year old female comes in today for evaluation of left and right neck pain.  It started about 10 days ago there in [place], [place].  She slept in a different bed with a different pill and woke up the next day with a neck ache.  She denies any neurologic symptoms.  No history of trauma  Acute Visit History:      She denies abdominal pain, chest pain, constipation, cough, diarrhea, earache, eye symptoms, fever, genitourinary symptoms, headache, musculoskeletal symptoms, nasal discharge, nausea, rash, sinus problems, sore throat, and vomiting.         Current Allergies: ! * CAT SCAN DYE ! * CALCIUM SUPPLEMENTS ! * "STRONG ABX"     Review of Systems      See HPI   Physical Exam  General:     Well-developed,well-nourished,in no acute distress; alert,appropriate and cooperative throughout examination Head:     Normocephalic and atraumatic without obvious abnormalities. No apparent alopecia or balding. Msk:     No deformity or scoliosis noted of thoracic or lumbar spine.   Extremities:     No clubbing, cyanosis, edema, or deformity noted with normal full range of motion of all joints.   Neurologic:     No cranial nerve deficits noted. Station and gait are normal. Plantar reflexes are down-going bilaterally. DTRs are symmetrical throughout. Sensory, motor and coordinative functions appear intact.    Impression & Recommendations:  Problem # 1:  SPRAIN/STRAIN, NECK  (ICD-847.0)  Her updated medication list for this problem includes:    Darvocet-n 100 100-650 Mg Tabs (Propoxyphene n-apap) .Marland Kitchen... As needed    Flexeril 10 Mg Tabs (Cyclobenzaprine hcl) .Marland Kitchen... At bedtime as needed   Complete Medication List: 1)  Toprol Xl 50 Mg Tb24 (Metoprolol succinate) .... Take 1 tablet by mouth once a day 2)  Glucosamine  .... Take 1 tablet by mouth once a day 3)  Darvocet-n 100 100-650 Mg Tabs (Propoxyphene n-apap) .... As needed 4)  Zantac 150 Mg Tabs (Ranitidine hcl) .... Twice a day as needed 5)  Levsin/sl 0.125 Mg Subl (Hyoscyamine sulfate) .... As needed 6)  Protonix 40 Mg Tbec (Pantoprazole sodium) .... Take 1 tablet by mouth once a day 7)  Flexeril 10 Mg Tabs (Cyclobenzaprine hcl) .... At bedtime as needed   Patient Instructions: 1)  take a half of the Flexeril at bedtime, along with a Darvocet-N 100.  Sleep on one pillow put a heating pad under the pillow and turned on low heat.  Wear a soft cervical collar at bedtime and takes 600 mg of Motrin twice a day with food 2)  Please schedule a follow-up appointment as needed.    ]

## 2010-11-18 NOTE — Cardiovascular Report (Signed)
Summary: TTM   TTM   Imported By: Roderic Ovens 07/17/2010 16:10:36  _____________________________________________________________________  External Attachment:    Type:   Image     Comment:   External Document

## 2010-11-18 NOTE — Assessment & Plan Note (Signed)
Summary: flu shot/cjr  Nurse Visit   Allergies: 1)  ! * Cat Scan Dye 2)  ! * "strong Abx"  Orders Added: 1)  Flu Vaccine 74yrs + MEDICARE PATIENTS [Q2039] 2)  Administration Flu vaccine - MCR [G0008] Flu Vaccine Consent Questions     Do you have a history of severe allergic reactions to this vaccine? no    Any prior history of allergic reactions to egg and/or gelatin? no    Do you have a sensitivity to the preservative Thimersol? no    Do you have a past history of Guillan-Barre Syndrome? no    Do you currently have an acute febrile illness? no    Have you ever had a severe reaction to latex? no    Vaccine information given and explained to patient? yes    Are you currently pregnant? no    Lot Number:[id number]   Exp Date:04/18/2011   Site Given  Left Deltoid IM 2)  Administration Flu vaccine - MCR [G0008] .lbmedflu

## 2010-11-18 NOTE — Cardiovascular Report (Signed)
Summary: TTM   TTM   Imported By: Roderic Ovens 11/26/2009 16:06:07  _____________________________________________________________________  External Attachment:    Type:   Image     Comment:   External Document

## 2010-11-18 NOTE — Miscellaneous (Signed)
Summary: referral  Clinical Lists Changes  Problems: Added new problem of MEMORY LOSS (ICD-780.93) Orders: Added new Referral order of Neurology Referral (Neuro) - Signed

## 2010-11-20 NOTE — Miscellaneous (Signed)
Summary: [facility]   Imported By: Sherian Rein 10/31/2010 09:01:23  _____________________________________________________________________  External Attachment:    Type:   Image     Comment:   External Document

## 2010-11-20 NOTE — Progress Notes (Signed)
Summary: [facility]   Imported By: Sherian Rein 10/31/2010 08:56:53  _____________________________________________________________________  External Attachment:    Type:   Image     Comment:   External Document

## 2010-11-20 NOTE — Progress Notes (Signed)
Summary: REQUEST FOR RX (ABX)  Phone Note Call from Patient   Caller: Patient  [phone] Summary of Call: Pt called to adv that she has finished her prednisone med and is still exp sxs of productive cough (yellow-green sputum), congestion.... Pt requests that a Rx for abx be sent to  [facility] [address].... Pt was offered OV but declined stating her husband is in the hospital and she can't get into the office.... Pt can be reached at [phone] with any questions or concerns.  Initial call taken by: Debbra Riding,  September 30, 2010 8:24 AM  Follow-up for Phone Call        Fleet Contras please call,,,,,,,,, tell her to take one prednisone a day for 3 days a half for 3 days, then half a tablet every other day for two week taper and call in doxycycline 100 mg b.i.d. x 2 weeks no refill Follow-up by: Roderick Pee MD,  September 30, 2010 8:32 AM  Additional Follow-up for Phone Call Additional follow up Details #1::        tried to call line busy Additional Follow-up by: Kern Reap CMA Duncan Dull),  September 30, 2010 9:24 AM    Additional Follow-up for Phone Call Additional follow up Details #2::    spoke with patient.  called [facility] to inform them the rx was sent to the wrong pharmacy. Follow-up by: Kern Reap CMA Duncan Dull),  September 30, 2010 10:48 AM  New/Updated Medications: DOXYCYCLINE HYCLATE 100 MG CAPS (DOXYCYCLINE HYCLATE) take one cap two times a day for two weeks Prescriptions: DOXYCYCLINE HYCLATE 100 MG CAPS (DOXYCYCLINE HYCLATE) take one cap two times a day for two weeks  #14 x 0   Entered by:   Kern Reap CMA (AAMA)   Authorized by:   Roderick Pee MD   Signed by:   Kern Reap CMA (AAMA) on 09/30/2010   Method used:   Electronically to        [facility] [address] (retail)       [address]       [place], [place]  [address]       Ph: [phone]       Fax: [phone]   RxID:    [id number] DOXYCYCLINE HYCLATE 100 MG CAPS (DOXYCYCLINE HYCLATE) take one cap two times a day for two weeks  #14 x 0   Entered by:   Kern Reap CMA (AAMA)   Authorized by:   Roderick Pee MD   Signed by:   Kern Reap CMA (AAMA) on 09/30/2010   Method used:   Electronically to        [id number] (retail)       [address]       [place], [place]  [address]       Ph: [phone]       Fax: [phone]   RxID:   [id number]

## 2010-11-20 NOTE — Progress Notes (Signed)
Summary: [facility]   Imported By: Sherian Rein 10/31/2010 08:59:03  _____________________________________________________________________  External Attachment:    Type:   Image     Comment:   External Document

## 2010-11-20 NOTE — Consult Note (Signed)
Summary: [facility]   Imported By: Sherian Rein 10/31/2010 08:54:07  _____________________________________________________________________  External Attachment:    Type:   Image     Comment:   External Document

## 2010-11-20 NOTE — Cardiovascular Report (Signed)
Summary: TTM   TTM   Imported By: Roderic Ovens 10/15/2010 15:50:02  _____________________________________________________________________  External Attachment:    Type:   Image     Comment:   External Document

## 2010-11-20 NOTE — Progress Notes (Signed)
Summary: refill  Phone Note Refill Request Message from:  Patient---live call  Refills Requested: Medication #1:  PREDNISONE 20 MG TABS UAD send to [facility]  Initial call taken by: Warnell Forester,  September 30, 2010 10:58 AM    Prescriptions: PREDNISONE 20 MG TABS (PREDNISONE) UAD  #30 x 1   Entered by:   Kern Reap CMA (AAMA)   Authorized by:   Roderick Pee MD   Signed by:   Kern Reap CMA (AAMA) on 09/30/2010   Method used:   Electronically to        [facility] [address] (retail)       [address]       [place], [place]  [address]       Ph: [phone]       Fax: [phone]   RxID:   [id number]

## 2010-11-20 NOTE — Progress Notes (Signed)
Summary: appointment  Phone Note Call from Patient Call back at Home Phone [phone]   Caller: Patient Call For: DR [facility] Summary of Call: Patient phoned looks like she was last seen in 2007 she would like to be seen by Dr. Shelle Iron for breathing problems. She gets weary and having problems breathing chest xray about two weeks ago and it was negative. Dr Teddy Spike first available is 1/16 and patient would like to be seen sooner. Patient can be reached [phone] Initial call taken by: Vedia Coffer,  October 21, 2010 1:11 PM  Follow-up for Phone Call        scheduled pt to see [facility] on 1/10 at 11am--appt given to husband, pt was last seen in 2007 so appt was scheduled as a consult Follow-up by: Philipp Deputy CMA,  October 21, 2010 3:25 PM

## 2010-11-20 NOTE — Assessment & Plan Note (Signed)
Summary: Lauren Buchanan   Visit Type:  Follow-up Primary Provider:  Dr. Tawanna Cooler   History of Present Illness: The patient is 77 years old and return for management of her cardiac arrhythmia and pacemaker. In 1999 she had a St. Jude generator implanted for AV block. She had normal coronary angiography at that time. She had a generator change in 2009. She had some pocket stimulation the unipolar mode but was reprogrammed to the bipolar mode and has not had problems with this.  She has had previous episodes of SVT which was managed with metoprolol and Cardizem. She could not tolerate the Cardizem and is now only on metoprolol.  She has been doing well with her heart but she says she's had increased difficulty with cough and shortness of breath related to her pulmonary disease. She has COPD and has bronchiectasis and has had a previous lobectomy for this.    Current Medications (verified): 1)  Glucosamine .... Take 1 Tablet By Mouth Once A Day 2)  Levsin/sl 0.125 Mg  Subl (Hyoscyamine Sulfate) .... As Needed 3)  Metoprolol Succinate 100 Mg Xr24h-Tab (Metoprolol Succinate) .... Take One Half Tab By Mouth Evey Morning 4)  Omega-3 350 Mg Caps (Omega-3 Fatty Acids) .... Once Daily 5)  Citracal Petites/vitamin D 200-250 Mg-Unit Tabs (Calcium Citrate-Vitamin D) .... Once Daily 6)  Vitamin D3 2000 Unit Caps (Cholecalciferol) .... Once Daily 7)  Vitamin B-12 500 Mcg Tabs (Cyanocobalamin) .... Once Daily 8)  Vitamin C 500 Mg Tabs (Ascorbic Acid) .... Once Daily 9)  Doxycycline Hyclate 100 Mg Caps (Doxycycline Hyclate) .... Take One Cap Two Times A Day For Two Weeks  Allergies: 1)  ! * Cat Scan Dye 2)  ! * "strong Abx"  Past History:  Past Medical History: Reviewed history from 09/07/2008 and no changes required. DJD IBS Goiter Chronic Bronchitis pacemaker 1999 childbirth x 4 gallbladder removal lobectomy because of bronchiectasis hemorrhoidectomy renal cyst  1. Palpitations and documented mode  switches probably related to     paroxysmal atrial fibrillation. 2. Status post St. Jude Affinity DDD pacemaker implantation in 1999     for second-degree AV block with recent generator change 11/09 3. Previous pocket stimulation in a unipolar mode resolved with     bipolar pacing. 4. Chronic obstructive pulmonary disease with a partial lobectomy for     bronchiectasis and FEV-1 of 1.0.   Review of Systems       ROS is negative except as outlined in HPI.   Vital Signs:  Patient profile:   77 year old female Height:      59.5 inches Weight:      123 pounds Pulse rate:   106 / minute BP sitting:   100 / 60  Vitals Entered By: Laurance Flatten CMA (October 14, 2010 9:49 AM)  Physical Exam  Additional Exam:  Gen. Well-nourished, in no distress   Neck: No JVD, thyroid not enlarged, no carotid bruits Lungs: No tachypnea, clear without rales, rhonchi or wheezes, generalized decreased breath sounds Cardiovascular: Rhythm regular, PMI not displaced,  heart sounds  normal, no murmurs or gallops, no peripheral edema, pulses normal in all 4 extremities. Abdomen: BS normal, abdomen soft and non-tender without masses or organomegaly, no hepatosplenomegaly. MS: No deformities, no cyanosis or clubbing   Neuro:  No focal sns   Skin:  no lesions    PPM Specifications Following MD:  Everardo Beals. Juanda Chance, MD     PPM Vendor:  St Jude     PPM Model  Number:  [id number]     PPM Serial Number:  [id number] PPM DOI:  09/06/2008     PPM Implanting MD:  Everardo Beals. Juanda Chance, MD  Lead 1    Location: RA     DOI: 11/192009     Model #: [id number]     Serial #: [id number]     Status: active Lead 2    Location: RV     DOI: 11/192009     Model #: [id number]     Serial #: [id number]     Status: active   Indications:  BRADY   PPM Follow Up Pacer Dependent:  Yes      Episodes Coumadin:  No  Parameters Mode:  DDD     Lower Rate Limit:  60     Upper Rate Limit:  110 Paced AV Delay:  170     Sensed AV Delay:  150  Impression &  Recommendations:  Problem # 1:  PACEMAKER, PERMANENT (ICD-V45.01) She has a St. Jude DDD pacemaker implanted in 1999 for AV block with a generator change in 2009. She declined to have her pacemaker interrogated today and said "she would rather die."  She had problems with previous interrogations which caused her heart to race and she said she blacked out. On her last telephone check she had longevity greater than 10 years. She was ventricular pacing 99% of the time but did have R waves so she was not totally pacer dependent.  We will continue to follow her on phone checks. I'll arrange for her to see Dr. Johney Frame for followup of her pacemaker in one year.  Problem # 2:  SINUS TACHYCARDIA (ICD-427.89) This may be related to the flareup in her pulmonary problem. We will check a CBC and a TSH. I'm reluctant to go up on her metoprolol because of her relatively low blood pressure and sensitivity to medications. Her updated medication list for this problem includes:    Metoprolol Succinate 100 Mg Xr24h-tab (Metoprolol succinate) .Marland Kitchen... Take one half tab by mouth evey morning  Her updated medication list for this problem includes:    Metoprolol Succinate 100 Mg Xr24h-tab (Metoprolol succinate) .Marland Kitchen... Take one half tab by mouth evey morning  Problem # 3:  COPD (ICD-496) She has new symptoms of cough and shortness of breath. She has seen Dr. Tawanna Cooler for this. He is managing this problem.  Other Orders: EKG w/ Interpretation (93000) TLB-CBC Platelet - w/Differential (85025-CBCD) TLB-TSH (Thyroid Stimulating Hormone) (84443-TSH)  Patient Instructions: 1)  Labwork today: cbc/tsh (426.0). 2)  Your physician recommends that you continue on your current medications as directed. Please refer to the Current Medication list given to you today. 3)  Your physician wants you to follow-up in: 1 year with Dr. Johney Frame.  You will receive a reminder letter in the mail two months in advance. If you don't receive a letter,  please call our office to schedule the follow-up appointment.

## 2010-11-20 NOTE — Assessment & Plan Note (Signed)
Summary: chest congestion/cough/cjr   Vital Signs:  Patient profile:   77 year old female Weight:      123 pounds Temp:     98.0 degrees F oral BP sitting:   130 / 80  (left arm) Cuff size:   regular  Vitals Entered By: Kern Reap CMA Duncan Dull) (October 07, 2010 11:09 AM) CC: chest congestion   Primary Care Provider:  Dr. Tawanna Cooler  CC:  chest congestion.  History of Present Illness: Lauren Buchanan is an 77 year old, married female, nonsmoker, who comes in today for reevaluation of a cough for 3 weeks.  Three weeks ago she developed a cough.  She's had a history of underlying COPD and pneumonia.  We start her on prednisone 20 mg dose two tabs daily with a taper.  She is down to half a tablet every other day.  However, she still coughing.  She continues to take the doxycycline, 100 mg b.i.d.  Review of systems negative.  Allergies: 1)  ! * Cat Scan Dye 2)  ! * "strong Abx"  Past History:  Past medical, surgical, family and social histories (including risk factors) reviewed for relevance to current acute and chronic problems.  Past Medical History: Reviewed history from 09/07/2008 and no changes required. DJD IBS Goiter Chronic Bronchitis pacemaker 1999 childbirth x 4 gallbladder removal lobectomy because of bronchiectasis hemorrhoidectomy renal cyst  1. Palpitations and documented mode switches probably related to     paroxysmal atrial fibrillation. 2. Status post St. Jude Affinity DDD pacemaker implantation in 1999     for second-degree AV block with recent generator change 11/09 3. Previous pocket stimulation in a unipolar mode resolved with     bipolar pacing. 4. Chronic obstructive pulmonary disease with a partial lobectomy for     bronchiectasis and FEV-1 of 1.0.   Past Surgical History: Reviewed history from 05/16/2008 and no changes required. Pacemaker GB Lobectomy Bronchietasis left hemorrhoidectomy renal cyst  Family History: Reviewed history and no  changes required.  Social History: Reviewed history from 04/16/2008 and no changes required. Retired Married Never Smoked Alcohol use-no Drug use-no Regular exercise-yes  Review of Systems      See HPI  Physical Exam  General:  Well-developed,well-nourished,in no acute distress; alert,appropriate and cooperative throughout examination Head:  Normocephalic and atraumatic without obvious abnormalities. No apparent alopecia or balding. Eyes:  No corneal or conjunctival inflammation noted. EOMI. Perrla. Funduscopic exam benign, without hemorrhages, exudates or papilledema. Vision grossly normal. Ears:  External ear exam shows no significant lesions or deformities.  Otoscopic examination reveals clear canals, tympanic membranes are intact bilaterally without bulging, retraction, inflammation or discharge. Hearing is grossly normal bilaterally. Nose:  External nasal examination shows no deformity or inflammation. Nasal mucosa are pink and moist without lesions or exudates. Mouth:  Oral mucosa and oropharynx without lesions or exudates.  Teeth in good repair. Neck:  No deformities, masses, or tenderness noted. Chest Wall:  No deformities, masses, or tenderness noted. Lungs:  Normal respiratory effort, chest expands symmetrically. Lungs are clear to auscultation, no crackles or wheezes. Heart:  Normal rate and regular rhythm. S1 and S2 normal without gallop, murmur, click, rub or other extra sounds.   Problems:  Medical Problems Added: 1)  Dx of Cough  (ICD-786.2)  Impression & Recommendations:  Problem # 1:  COUGH (ICD-786.2) Assessment Deteriorated  Orders: T-2 View CXR (71020TC)  Complete Medication List: 1)  Glucosamine  .... Take 1 tablet by mouth once a day 2)  Levsin/sl 0.125 Mg Subl (  Hyoscyamine sulfate) .... As needed 3)  Metoprolol Succinate 100 Mg Xr24h-tab (Metoprolol succinate) .... Take one half tab by mouth evey morning 4)  Omega-3 350 Mg Caps (Omega-3 fatty acids)  .... Once daily 5)  Citracal Petites/vitamin D 200-250 Mg-unit Tabs (Calcium citrate-vitamin d) .... Once daily 6)  Vitamin D3 2000 Unit Caps (Cholecalciferol) .... Once daily 7)  Vitamin B-12 500 Mcg Tabs (Cyanocobalamin) .... Once daily 8)  Vitamin C 500 Mg Tabs (Ascorbic acid) .... Once daily 9)  Prednisone 20 Mg Tabs (Prednisone) .... Uad 10)  Doxycycline Hyclate 100 Mg Caps (Doxycycline hyclate) .... Take one cap two times a day for two weeks  Patient Instructions: 1)  go to the main office now for a chest x-ray.  I will call u   The report and we will discuss treatment options 2)  Prednisone one half tablet every other day for 3 weeks.  Return p.r.n.   Orders Added: 1)  Est. Patient Level III [16606] 2)  T-2 View CXR [71020TC]

## 2010-11-20 NOTE — Progress Notes (Signed)
Summary: test results  Phone Note Call from Patient Call back at Home Phone [phone]   Caller: Patient Reason for Call: Lab or Test Results Initial call taken by: Judie Grieve,  November 03, 2010 10:02 AM  Follow-up for Phone Call        Previous pt of Dr Juanda Chance that will be following with Dr Johney Frame.  Lab results were forwarded to Dr Johney Frame for review.  I will send message to [facility]. 10/14/10 labs need to be reviewed by Dr Johney Frame.  Julieta Gutting, RN, BSN  November 03, 2010 10:29 AM pt aware Dennis Bast, RN, BSN  November 03, 2010 1:05 PM

## 2010-11-20 NOTE — Assessment & Plan Note (Signed)
Summary: consult for dyspnea   Primary Provider/Referring Provider:  Dr. Tawanna Cooler  CC:  pulmonary consult.  History of Present Illness: The pt is an 77y/o female who comes in today for evaluation of dyspnea.  She apparently carries the diagnosis of bronchiectasis and copd, and has seen me in the past.  Her records will be reviewed once available.  She developed URI symptoms the end of september, with chest congestion, cough with purulence, and sore throat.  She was treated with abx and a course of prednisone, and felt much better.  Since that time, she feels she has not returned to her normal baseline, and feels she cannot move air freely.  She has minimal cough with clear mucus.  She will get winded bringing groceries in from the car, but will not get sob sweeping.  She describes a 4-5 block doe at a moderate pace on flat ground.  She denies any LE edema or chest discomfort.  She does have a h/o old granulomatous disease on cxr, and tells me she grew up in [place].  She also has had LL lobectomy for bronchiectasis.  Current Medications (verified): 1)  Glucosamine .... Take 1 Tablet By Mouth Once A Day 2)  Levsin/sl 0.125 Mg  Subl (Hyoscyamine Sulfate) .... As Needed 3)  Metoprolol Succinate 100 Mg Xr24h-Tab (Metoprolol Succinate) .... Take One Half Tab By Mouth Evey Morning 4)  Omega-3 350 Mg Caps (Omega-3 Fatty Acids) .... Once Daily 5)  Citracal Petites/vitamin D 200-250 Mg-Unit Tabs (Calcium Citrate-Vitamin D) .... Once Daily 6)  Vitamin D3 2000 Unit Caps (Cholecalciferol) .... Once Daily 7)  Vitamin B-12 500 Mcg Tabs (Cyanocobalamin) .... Once Daily 8)  Vitamin C 500 Mg Tabs (Ascorbic Acid) .... Once Daily  Allergies: 1)  ! * Cat Scan Dye 2)  ! * "strong Abx" 3)  ! Augmentin  Past History:  Past Medical History: DJD IBS Goiter pacemaker 1999 childbirth x 4 gallbladder removal Left lower lobectomy for bronchiectasis in the 1950's hemorrhoidectomy renal cyst  1. Palpitations and  documented mode switches probably related to     paroxysmal atrial fibrillation. 2. Status post St. Jude Affinity DDD pacemaker implantation in 1999     for second-degree AV block with recent generator change 11/09 3. Previous pocket stimulation in a unipolar mode resolved with     bipolar pacing. 4. Chronic obstructive pulmonary disease with a partial lobectomy for     bronchiectasis and FEV-1 of 1.0.   Past Surgical History: Pacemaker cholecystectomy approx 20 years ago Lobectomy Bronchietasis left approx 55 years ago hemorrhoidectomy renal cyst  Family History: Reviewed history and no changes required. heart disease: mother (m.i.) cancer: mother (breast) father (stomach)   Social History: Reviewed history from 04/16/2008 and no changes required. Retired Married has children Never Smoked Alcohol use-no Drug use-no Regular exercise-yes  Review of Systems       The patient complains of shortness of breath with activity, productive cough, acid heartburn, indigestion, and anxiety.  The patient denies shortness of breath at rest, non-productive cough, coughing up blood, chest pain, irregular heartbeats, loss of appetite, weight change, abdominal pain, difficulty swallowing, sore throat, tooth/dental problems, headaches, nasal congestion/difficulty breathing through nose, sneezing, itching, ear ache, depression, hand/feet swelling, joint stiffness or pain, rash, change in color of mucus, and fever.    Vital Signs:  Patient profile:   77 year old female Height:      59.5 inches Weight:      124 pounds BMI:  24.71 O2 Sat:      96 % on Room air Temp:     98.1 degrees F oral Pulse rate:   66 / minute BP sitting:   122 / 78  (left arm) Cuff size:   regular  Vitals Entered By: Arman Filter LPN (October 28, 2010 11:10 AM)  O2 Flow:  Room air CC: pulmonary consult Comments Medications reviewed with patient Arman Filter LPN  October 28, 2010 11:16 AM    Physical  Exam  General:  thin female in nad Eyes:  PERRLA and EOMI.   Nose:  patent without discharge no purulence noted. Mouth:  clear, no exudates or lesions seen Neck:  no jvd, tmg, LN Lungs:  crackles right base that are very prominent. faint left basilar crackles no wheezing Heart:  rrr with ectopics, 2/6 sem Abdomen:  soft and nontender, bs+ Extremities:  no significant edema or cyanosis  pulses intact distally Neurologic:  alert and oriented, moves all 4.   Impression & Recommendations:  Problem # 1:  SHORTNESS OF BREATH (ICD-786.05) the pt is having doe, and persistent airway symptoms that are mild after what sounds like a URI or tracheobronchitis.  She does have a history of bronchiectasis, and has some hyperinflation on her cxr.  She may have copd related to her bronchiectasis, and would like to set up for full pfts for evaluation.  If she has does indeed have significant airflow obstruction, she may benefit from maintenance bronchodilator regimen.  Other Orders: Consultation Level IV (57846) Pulmonary Referral (Pulmonary)  Patient Instructions: 1)  will setup for breathing studies, and see you back same day to discuss results.

## 2010-11-20 NOTE — Progress Notes (Signed)
Summary: ? direction and qty  Phone Note From Pharmacy Call back at [phone]   Caller: [facility] [address] Summary of Call: phar is questioning doxycycline 100 mg please verify direction and qty Initial call taken by: Heron Sabins,  September 30, 2010 11:41 AM    Prescriptions: DOXYCYCLINE HYCLATE 100 MG CAPS (DOXYCYCLINE HYCLATE) take one cap two times a day for two weeks  #28 x 0   Entered by:   Kern Reap CMA (AAMA)   Authorized by:   Roderick Pee MD   Signed by:   Kern Reap CMA (AAMA) on 09/30/2010   Method used:   Electronically to        [facility] [address] (retail)       [address]       [place], [place]  [address]       Ph: [phone]       Fax: [phone]   RxID:   [id number]

## 2010-11-20 NOTE — Progress Notes (Signed)
  Phone Note Outgoing Call   Summary of Call: I called [facility] chest x-ray normal, take a half a tablet of prednisone, Monday, Wednesday, Friday, for 3 weeks.  Return in 3 weeks if not improved, sooner if any problems or you get worse Initial call taken by: Roderick Pee MD,  October 07, 2010 2:06 PM

## 2010-11-26 NOTE — Miscellaneous (Signed)
Summary: Orders Update pft charges   Clinical Lists Changes  Orders: Added new Service order of Carbon Monoxide diffusing w/capacity (94729) - Signed Added new Service order of Lung Volumes/Gas dilution or washout (94727) - Signed Added new Service order of Spirometry (Pre & Post) (94060) - Signed 

## 2010-11-26 NOTE — Assessment & Plan Note (Signed)
Summary: rov for bronchiectasis with mild airflow obstruction.   Primary Provider/Referring Provider:  Dr. Tawanna Cooler  CC:  Ov to discuss PFT results. .  History of Present Illness: the pt comes in today for f/u of her pfts.  She was found to have mild airflow obstruction, no restriction, mild airtrapping, and a minimal decrease in DLCO.  I have reviewed the results with her in detail, and answered all of her questions.  Currently, she feels she is doing well and back to her usual baseline.  She is satisfied with how she feels currently.  Medications Prior to Update: 1)  Glucosamine .... Take 1 Tablet By Mouth Once A Day 2)  Levsin/sl 0.125 Mg  Subl (Hyoscyamine Sulfate) .... As Needed 3)  Metoprolol Succinate 100 Mg Xr24h-Tab (Metoprolol Succinate) .... Take One Half Tab By Mouth Evey Morning 4)  Omega-3 350 Mg Caps (Omega-3 Fatty Acids) .... Once Daily 5)  Citracal Petites/vitamin D 200-250 Mg-Unit Tabs (Calcium Citrate-Vitamin D) .... Once Daily 6)  Vitamin D3 2000 Unit Caps (Cholecalciferol) .... Once Daily 7)  Vitamin B-12 500 Mcg Tabs (Cyanocobalamin) .... Once Daily 8)  Vitamin C 500 Mg Tabs (Ascorbic Acid) .... Once Daily  Allergies (verified): 1)  ! * Cat Scan Dye 2)  ! * "strong Abx" 3)  ! Augmentin 4)  ! Claritin  Past History:  Past medical, surgical, family and social histories (including risk factors) reviewed, and no changes noted (except as noted below).  Past Medical History: DJD IBS Goiter pacemaker 1999 childbirth x 4 gallbladder removal Left lower lobectomy for bronchiectasis in the 1950's hemorrhoidectomy renal cyst  1. Palpitations and documented mode switches probably related to     paroxysmal atrial fibrillation. 2. Status post St. Jude Affinity DDD pacemaker implantation in 1999     for second-degree AV block with recent generator change 11/09 3. Previous pocket stimulation in a unipolar mode resolved with     bipolar pacing. 4. Chronic obstructive  pulmonary disease with a partial lobectomy for     bronchiectasis and FEV-1 of 1.27 with ratio 66 11/18/10.  Past Surgical History: Reviewed history from 10/28/2010 and no changes required. Pacemaker cholecystectomy approx 20 years ago Lobectomy Bronchietasis left approx 55 years ago hemorrhoidectomy renal cyst  Family History: Reviewed history from 10/28/2010 and no changes required. heart disease: mother (m.i.) cancer: mother (breast) father (stomach)   Social History: Reviewed history from 10/28/2010 and no changes required. Retired Married has children Never Smoked Alcohol use-no Drug use-no Regular exercise-yes  Review of Systems       The patient complains of productive cough, acid heartburn, indigestion, and loss of appetite.  The patient denies shortness of breath with activity, shortness of breath at rest, non-productive cough, coughing up blood, chest pain, irregular heartbeats, weight change, abdominal pain, difficulty swallowing, sore throat, tooth/dental problems, headaches, nasal congestion/difficulty breathing through nose, sneezing, itching, ear ache, anxiety, depression, hand/feet swelling, joint stiffness or pain, rash, change in color of mucus, and fever.    Vital Signs:  Patient profile:   77 year old female Height:      60 inches Weight:      124 pounds BMI:     24.30 O2 Sat:      100 % on Room air Temp:     97.6 degrees F oral Pulse rate:   67 / minute BP sitting:   138 / 76  (left arm) Cuff size:   regular  Vitals Entered By: Arman Filter LPN (November 18, 2010 11:52 AM)  O2 Flow:  Room air CC: Ov to discuss PFT results.  Comments Medications reviewed with patient Arman Filter LPN  November 18, 2010 11:56 AM    Physical Exam  General:  thin female in nad Lungs:  clear except faint bibasilar crackles. Extremities:  no significant edema or cyanosis  Neurologic:  alert and oriented, moves all 4.   Impression & Recommendations:  Problem  # 1:  COPD, MILD (ICD-496) the pt has mild airflow obstruction by her pfts today, most likely due to her underlying bronchiectasis.  She feels that she is now back to baseline wrt her breathing, and is really not interested in staying on maintenance meds.  I am ok with that as long as she is not having frequent exacerbations of her bronchiectasis, or having issues with sob that require overuse of rescue inhaler.  I have discussed all of this with her, and she voices understanding.  Will see her back in 6mos to see how things are going.  Medications Added to Medication List This Visit: 1)  Proair Hfa 108 (90 Base) Mcg/act Aers (Albuterol sulfate) .... 2 puffs every 6 hours as needed  Other Orders: Est. Patient Level III (16109)  Patient Instructions: 1)  keep albuterol on hand to use as needed when you get short of breath or having chest symptoms.  2 puffs up to every 6hrs if needed. 2)  followup with me in 6mos to check on how things are going.   Prescriptions: PROAIR HFA 108 (90 BASE) MCG/ACT  AERS (ALBUTEROL SULFATE) 2 puffs every 6 hours as needed  #1 x 6   Entered and Authorized by:   Barbaraann Share MD   Signed by:   Barbaraann Share MD on 11/18/2010   Method used:   Print then Give to Patient   RxID:   [id number]

## 2010-12-02 ENCOUNTER — Encounter: Payer: Self-pay | Admitting: Internal Medicine

## 2010-12-02 DIAGNOSIS — I441 Atrioventricular block, second degree: Secondary | ICD-10-CM

## 2010-12-12 ENCOUNTER — Other Ambulatory Visit: Payer: Self-pay | Admitting: Dermatology

## 2010-12-30 NOTE — Cardiovascular Report (Signed)
Summary: TTM   TTM   Imported By: Roderic Ovens 12/22/2010 15:05:58  _____________________________________________________________________  External Attachment:    Type:   Image     Comment:   External Document

## 2011-02-11 ENCOUNTER — Ambulatory Visit (INDEPENDENT_AMBULATORY_CARE_PROVIDER_SITE_OTHER): Payer: Medicare Other

## 2011-02-11 ENCOUNTER — Inpatient Hospital Stay (INDEPENDENT_AMBULATORY_CARE_PROVIDER_SITE_OTHER)
Admission: RE | Admit: 2011-02-11 | Discharge: 2011-02-11 | Disposition: A | Discharge status: Home / Self Care | Payer: Medicare Other | Source: Ambulatory Visit | Attending: Family Medicine | Admitting: Family Medicine

## 2011-02-11 DIAGNOSIS — S52599A Other fractures of lower end of unspecified radius, initial encounter for closed fracture: Secondary | ICD-10-CM

## 2011-02-11 DIAGNOSIS — S52609A Unspecified fracture of lower end of unspecified ulna, initial encounter for closed fracture: Secondary | ICD-10-CM

## 2011-02-17 ENCOUNTER — Observation Stay (HOSPITAL_COMMUNITY)
Admission: AD | Admit: 2011-02-17 | Discharge: 2011-02-18 | Disposition: A | Discharge status: Home / Self Care | Payer: Medicare Other | Source: Ambulatory Visit | Attending: Orthopedic Surgery | Admitting: Orthopedic Surgery

## 2011-02-17 DIAGNOSIS — Z950 Presence of cardiac pacemaker: Secondary | ICD-10-CM

## 2011-02-17 DIAGNOSIS — S52599A Other fractures of lower end of unspecified radius, initial encounter for closed fracture: Secondary | ICD-10-CM | POA: Insufficient documentation

## 2011-02-17 DIAGNOSIS — G8918 Other acute postprocedural pain: Secondary | ICD-10-CM | POA: Insufficient documentation

## 2011-02-17 DIAGNOSIS — R110 Nausea: Principal | ICD-10-CM | POA: Insufficient documentation

## 2011-02-24 NOTE — Discharge Summary (Signed)
  Lauren Buchanan, CABEZAS NO.:  [id number]  MEDICAL RECORD NO.:  [id number]           PATIENT TYPE:  O  LOCATION:  3732                         FACILITY:  [facility]  PHYSICIAN:  Madelynn Done, MD  DATE OF BIRTH:  [birth year]  DATE OF ADMISSION:  02/17/2011 DATE OF DISCHARGE:  02/18/2011                              DISCHARGE SUMMARY   CONSULTING PHYSICIANS:  [facility].  CONDITION ON DISCHARGE:  Good.  REASON FOR ADMISSION:  Nausea and IV pain medication.  DISCHARGE DIAGNOSIS: 1. Left wrist distal radius fracture. 2. Nausea.  PROCEDURES AND DATES:  None.  REASON FOR ADMISSION:  The patient is an 76 year old female who underwent open reduction and internal fixation of displaced distal radius fracture on February 16, 2011, and was admitted to [facility] for IV antibiotics and pain control in the postoperative period.  HOSPITAL COURSE:  The patient was admitted to the monitored bed after being seen by Cardiology.  She was seen by [facility] who felt pacemaker was functioning well.  The patient was seen and evaluated in the hospital.  She was tolerating a better diet.  Her pain has been controlled on p.o. pain medications and felt ready to discharge to home. On the day of discharge, the patient's discharge instructions were explained to her, questions were answered.  The patient voiced understanding the discharge plan.  CONDITION ON DISCHARGE:  Good.  DISCHARGE PLAN:  The patient will be discharged home, will be seen back in the office in approximately 10-14 days, wound check and suture removal, application of cast, use of the left upper extremity, ice and elevation.  Should she have any problem, she is to contact the office. The patient voiced understanding the plan on discharge, no use of the left arm.     Madelynn Done, MD    FWO/MEDQ  D:  02/17/2011  T:  02/18/2011  Job:  [id number]  Electronically Signed by Bradly Bienenstock IV MD on  02/24/2011 09:07:47 AM

## 2011-02-24 NOTE — H&P (Signed)
  [name], Lauren Buchanan NO.:  [id number]  MEDICAL RECORD NO.:  [id number]           PATIENT TYPE:  O  LOCATION:  3732                         FACILITY:  [facility]  PHYSICIAN:  Madelynn Done, MD  DATE OF BIRTH:  [birth year]  DATE OF ADMISSION:  02/17/2011 DATE OF DISCHARGE:                             HISTORY & PHYSICAL   REASON FOR ADMISSION:  Pain control and nausea.  ADMISSION DIAGNOSES: 1. Left distal radius fracture. 2. Nausea. 3. Pain control.  PROCEDURES:  None.  CONSULTING PHYSICIANS:  [facility].  BRIEF HISTORY:  [name] is an 77 year old female who underwent open reduction and internal fixation of a displaced left distal radius fracture yesterday at [facility].  The patient was admitted overnight, but she was having pain, requiring IV pain medication and nausea.  Based on her level of symptoms, she was transferred to [facility] for admission.  Also, the anesthesiologist was concerned about her pacemaker.  They asked then that she see the cardiologist in the hospital.  The patient is being admitted for the pain control and antiemetics as well as to be seen by Cardiology.  The patient voiced understanding of reason for the admission.  She in the postoperative period for her left wrist surgery which was performed on February 16, 2011.  All questions were answered and encouraged from [facility] upon admission.  [name] had voiced understanding of the plan and the reason for admission.     Madelynn Done, MD     FWO/MEDQ  D:  02/17/2011  T:  02/18/2011  Job:  [id number]  Electronically Signed by Bradly Bienenstock IV MD on 02/24/2011 09:07:50 AM

## 2011-03-02 NOTE — Consult Note (Signed)
  NAMEAVALEE, CASTRELLON NO.:  [id number]  MEDICAL RECORD NO.:  [id number]           PATIENT TYPE:  O  LOCATION:  3732                         FACILITY:  [facility]  PHYSICIAN:  Cassell Clement, M.D. DATE OF BIRTH:  Jul 10, 1925  DATE OF CONSULTATION:  02/17/2011 DATE OF DISCHARGE:                                CONSULTATION   We are asked to see this 77 year old woman in regard to possible pacemaker malfunction.  The patient is postoperative left wrist surgery. She has a pacemaker which was implanted initially in 1999 for high-grade AV block.  She had a pacemaker generator replacement by Dr. Charlies Constable on September 06, 2008.  She has a St. Jude Zephyr XL DR dual-chamber pacemaker.  Clinically, the patient has done well postoperatively.  She has been on telemetry and she has been noted on telemetry to have runs of paired ventricular complexes.  She has not been experiencing any symptoms of chest pain, dizziness, or palpitations.  PHYSICAL EXAMINATION:  VITAL SIGNS:  Stable.  Her blood pressure is 119/72, her pulse is 97 and slightly irregular, her temperature is 98. NECK:  Her jugular venous pressure is normal.  The carotids have a normal upstroke. LUNGS:  Clear to percussion and auscultation. HEART:  A grade 2/6 systolic ejection murmur at the base.  The pacer site in the left upper chest is well healed.  Telemetry shows what appears to be normal sinus rhythm with premature atrial beats and normal ventricular pacing.  However, we do want to have the pacemaker interrogated by the [facility]. Jude representative to be sure there is nothing else going on.  IT does appear that the pacemaker itself appears to be functioning normally.  DISPOSITION:  Continue current medication.  We have asked [employer] Jude representative to interrogate the pacemaker.          ______________________________ Cassell Clement, M.D.     TB/MEDQ  D:  02/17/2011  T:  02/18/2011  Job:   [id number]  cc:   Madelynn Done, MD  Electronically Signed by Cassell Clement M.D. on 03/02/2011 04:43:50 AM

## 2011-03-03 ENCOUNTER — Encounter: Payer: Self-pay | Admitting: Internal Medicine

## 2011-03-03 DIAGNOSIS — I441 Atrioventricular block, second degree: Secondary | ICD-10-CM

## 2011-03-03 NOTE — Assessment & Plan Note (Signed)
[facility]                            CARDIOLOGY OFFICE NOTE   LINDELL, TUSSEY                     MRN:          [id number]  DATE:09/27/2008                            DOB:          1925/05/21    PRIMARY CARE PHYSICIAN:  Tinnie Gens A. Tawanna Cooler, MD   CLINICAL HISTORY:  Ms. Bartelson is a 77 year old and returned for a  followup visit after a recent generator change.  She had a St. Jude  Affinity DDD pacemaker implanted in 1999 for a high-degree AV block.  At  that time, she had torsade ventricular tachycardia associated with  bradycardia prior to a pacer implantation.  She underwent coronary  angiography which was normal.  She recently reached ERI, and we brought  her in for a generator change.  A new St. Jude Zephyr was implanted.  She relates that she had feeling of rather intense shortness of breath,  and she is being interrogated prior to her new generator implanted.   Since she has gone home, she has done fairly well.  She does say that  when she swoops down and gets up, she has some shortness of breath which  she did not have with her previous pacemaker.   Her past medical history is significant for chronic obstructive  pulmonary disease.  She also has had a previous lobectomy for  bronchiectasis and has an FEV-1 of 1.0.  She also has a history of  hypertension.   Current medications include Toprol and glucosamine.   On examination today, blood pressure 139/72 and the pulse 68 and  regular.  There was no venous distension.  The carotid pulses were full  without bruits.  Chest was clear.  Cardiac rhythm was regular.  I could  hear no murmurs or gallops.  Abdomen was soft.  No organomegaly.  Peripheral pulses were full with no peripheral edema.   IMPRESSION:  1. Status post implantation of a St. Jude Affinity dual-mode, dual-      pacing, dual-sensing pacemaker in 1999 for second-degree      atrioventricular block with recent generator change  with St. Jude      Zephyr generator in November 2009.  2. Palpitations with documented mode switch with subsequent      resolution.  3. Previous pocket stimulation in the unipolar mode.  4. Chronic obstructive pulmonary disease with partial lobectomy with      bronchiectasis and FEV-1 of 1.0.   RECOMMENDATIONS:  I think Ms. Boyson is doing well.  I am not sure the  settings on her pacemaker.  We will interrogate her today to evaluate  that and make sure she is on her chronic  settings.  We will plan to interrogate her today and make sure she is on  her chronic thresholds.  I will see her back in followup in a year.     Bruce Elvera Lennox Juanda Chance, MD, [facility]  Electronically Signed    BRB/MedQ  DD: 09/27/2008  DT: 09/28/2008  Job #: [id number]

## 2011-03-03 NOTE — Assessment & Plan Note (Signed)
[facility]                            CARDIOLOGY OFFICE NOTE   AFNAN, CADIENTE                     MRN:          [id number]  DATE:09/03/2008                            DOB:          1925-02-03    PRIMARY CARE PHYSICIAN:  Tinnie Gens A. Tawanna Cooler, MD   CLINICAL HISTORY:  Ms. Pond is 77 years old and returned for followup  management of her pacemaker and palpitations.  She had a St. Jude  Affinity DDD pacemaker implanted in 1999 for high-degree AV block.  She  had had torsade ventricular tachycardia associated with bradycardia  prior to her implantation.  I saw her recently for symptoms of  palpitations and she had mode switches on her monitor, so we did an  event monitor to see if we can document atrial fibrillation thinking  that she would need Coumadin therapy.  Event monitor did not show any  significant arrhythmias and on re-interrogation today, she only had 1  mode switch less than 1 minute.  However, we did find that she had  reached ERI on her interrogation today.   PAST MEDICAL HISTORY:  Significant for COPD and she has had previous  lobectomy for bronchiectasis.  She has an FEV-1 of 1.0.  She also has a  history of hypertension.   CURRENT MEDICATIONS:  1. Toprol 50 mg daily.  2. Glucosamine.   PHYSICAL EXAMINATION:  VITAL SIGNS:  The blood pressure is 115/63 and  pulse 60 and regular.  NECK:  There was no vein distention.  The carotid pulses were full  without bruits.  CHEST:  Clear.  CARDIAC:  Rhythm was regular.  No murmurs or gallops.  ABDOMEN:  Soft with normal bowel sounds.  EXTREMITIES:  Peripheral pulses full with no peripheral edema.   Her electrocardiogram today showed atrial tracking and ventricular  pacing.  On interrogation of pacemaker, she had good thresholds on both  atrial and ventricular lead.  She was pacer dependent.  She had only one  mode switch that was less than 1 minute.  She had reached ERI with a  voltage  of 2.54.   IMPRESSION:  1. Palpitations with documented mode switches, but no evidence of      arrhythmias on event monitor and only one mode switch on recent      interrogation.  2. Status post St. Jude Affinity DDD pacemaker implantation in 1999      for atrioventricular block, now with good pacer function, but now      having reached elective replacement indicator.  3. Previous pocket stimulation in unipolar mode resolved with bipolar      pacing.  4. Chronic obstructive pulmonary disease with previous lobectomy for      bronchiectasis and an FEV-1 of 1.0.   RECOMMENDATIONS:  I thought that Ms. Rapaport was having atrial  fibrillation, but we were unable to document this.  She tells me that  she is almost categorically against taking Coumadin because she had a  husband, who had a serious complication from Coumadin.  Fortunately, we  do not have to make that decision today.  She has reached ERI, we will  arrange for her to come in for a generator change probably later this  week.  She is pacer dependent, but I probably would not put her in a  temporary pacemaker.     Bruce Elvera Lennox Juanda Chance, MD, [facility]  Electronically Signed    BRB/MedQ  DD: 09/03/2008  DT: 09/04/2008  Job #: [id number]

## 2011-03-03 NOTE — Discharge Summary (Signed)
NAMEJAZMAN, REUTER NO.:  [id number]   MEDICAL RECORD NO.:  [id number]          PATIENT TYPE:  OIB   LOCATION:  2899                         FACILITY:  [facility]   PHYSICIAN:  Everardo Beals. Juanda Chance, MD, FACCDATE OF BIRTH:  12/19/1924   DATE OF ADMISSION:  09/06/2008  DATE OF DISCHARGE:  09/06/2008                               DISCHARGE SUMMARY   FINAL DIAGNOSIS:  Explant of existing pacemaker which is a St. Jude  Affinity dual-chamber pacemaker with implant of the new device, a St.  Jude Zephyr XL DR dual-chamber pacemaker.  Her pacemaker is at elective  replacement indicator and needed to be replaced.   SECONDARY DIAGNOSES:  1. Torsade ventricular tachycardia associated with bradycardia.  2. Left heart catheterization which showed nonobstructive coronary      artery disease.  3. St. Jude Affinity dual-chamber pacemaker implanted 1999 for high      degree atrioventricular block.  4. Previous pocket stimulation in unipolar mode resolved with bipolar      pacing.  5. Chronic obstructive pulmonary disease with previous lobectomy for      bronchiectasis, her FEV-1 is 1.0.   PROCEDURE:  September 06, 2008, explant of existing St. Jude Affinity  dual-chamber pacemaker with implant of the St. Jude Zephyr XL DR dual-  chamber pacemaker.  Her pacemaker was at elective replacement indicator.   BRIEF HISTORY:  Ms. Kovarik is an 77 year old female.  She presents for  pacemaker generator change.  This was implanted in 1999 for high degree  AV  block.  She was seen recently.  The device has been interrogated at,  ERI at our office as the patient was complaining of palpitation and  event monitor was placed.  The study showed no significant arrhythmias.  The patient at pacer interrogation this morning which by the way did  reveal that the patient was pacer dependent had some dyspnea/presyncope  as the heart rate was allowed to slow during interrogation.  Then after  catching her  breath, she went to the bathroom and again she felt  presyncopal and short of breath.  On further examination, she has a  regular rhythm which is somewhat slow.  Her lungs demonstrated basilar  rales but just few of them right greater than left, otherwise clear to  auscultation.  The patient feels better on oxygen.  A chest x-ray was  obtained and showed there was no evidence of congestive heart failure.  The patient was then ready for pacemaker generator change.   HOSPITAL COURSE:  The patient presents electively September 06, 2008.  She underwent a successful generator change out by Dr. Charlies Constable.  She received a St. Jude Zephyr XL DR dual-chamber pacemaker.  She is not  having any undue pain that could not be controlled with oral analgesia.  Instructions have been given for the patient to keep her incision dry  for the next 7 days.  She has been given a prescription for Keflex 250  mg 1 tablet four times daily for the next 5 days and then she will  continue her regular medications.  1. Toprol-XL  50 mg daily.  2. Glucosamine daily.  3. Vitamins C and D daily.  4. Calcium daily.   She follows up with [facility], [address] through  the [facility] which is Wednesday September 11, 2008 at 2:30 and she  will see Dr. Juanda Chance on September 27, 2008 at 1:45.  The patient was given  a  prescription for Keflex at discharge.  She was asked to keep her  incision dry over the next 7 days.  She removes the bandage the morning  of Friday, September 07, 2008 and leaves the incision open to the air.  She is asked not to lift anything heavier than 10 pounds for the next 2  weeks.  No labs available on the chart for this admission.      Maple Mirza, PA      Bruce R. Juanda Chance, MD, [facility]  Electronically Signed    GM/MEDQ  D:  09/06/2008  T:  09/07/2008  Job:  [id number]   cc:   Tinnie Gens A. Tawanna Cooler, MD

## 2011-03-03 NOTE — Assessment & Plan Note (Signed)
[facility]                            CARDIOLOGY OFFICE NOTE   JAKE, GOODSON                     MRN:          [id number]  DATE:01/05/2008                            DOB:          26-Aug-1925    PRIMARY CARE PHYSICIAN:  Dr. Alonza Smoker.   CLINICAL HISTORY:  Ms. Kantor is 77 years old and had a St. Jude  Affinity DDD pacemaker placed in 1999 for high degree AV block.  She had  Torsades ventricular tachycardia and syncope with her AV block which was  documented in the hospital prior to her implantation.  She had done  quite well since that time and has had no recent chest pain, shortness  breath or palpitations.   PAST MEDICAL HISTORY:  Significant for chronic obstructive pulmonary  disease status post a previous lobectomy for bronchiectasis and she has  an FEV1 of 1.0.  She also has a history of hypertension.   CURRENT MEDICATIONS:  Toprol XL 50 mg daily and vitamins.   PHYSICAL EXAMINATION:  Blood pressure 122/69, pulse 72 and regular.  There was no venous tension.  The carotid pulses were full without  bruits.  CHEST:  Clear.  CARDIAC:  Rhythm was regular.  I hear no murmurs or gallops.  ABDOMEN:  Soft without organomegaly.  Peripheral pulses were full and  there was no peripheral edema.  We interrogated her pacemaker and she  was atrial pacing 6% of the time and ventricular pacing 100% of the  time. She had good thresholds on both leads.  She is pacer dependent.  Her battery voltage had increased to 7 kilohms and her estimated better  longevity was 1 year.   IMPRESSION:  1. Status post St. Jude Affinity DDD pacemaker implantation in 1999      for a second-degree AV block, now stable with good pacer function,      pacer dependent, 12 months to ER I.  2. Previous pocket stimulation of the unipolar mode now resolved with      bipolar pacing.  3. Chronic obstructive pulmonary disease status post partial lobectomy      for bronchiectasis  with an FEV1 of 1.0.   RECOMMENDATIONS:  Ms. Panameno is doing well.  She is getting close to ERI  and she is pacer dependent.  Will have her phone checks now only 3  months per Medicare regulations.  We will have her get a phone check in  about a 6-weeks (her last one was in December) and then we will see her  back in about 4 months in the pacer clinic to see if she is closer to  ERI.  I will see him back in follow-up in a year.    Bruce Elvera Lennox Juanda Chance, MD, [facility]  Electronically Signed   BRB/MedQ  DD: 01/05/2008  DT: 01/05/2008  Job #: [id number]

## 2011-03-03 NOTE — Assessment & Plan Note (Signed)
[facility]                            CARDIOLOGY OFFICE NOTE   Lauren Buchanan, Lauren Buchanan                     MRN:          [id number]  DATE:08/01/2008                            DOB:          12-11-1924    PRIMARY CARE PHYSICIAN:  Tinnie Gens A. Tawanna Cooler, MD.   CLINICAL HISTORY:  Lauren Buchanan is an 77 years old returns because of a  recent symptoms of palpitations.  She had a St. Jude Affinity DDD  pacemaker implanted in 1999 for high degree AV block.  She had a torsade  ventricular tachycardia associated bradycardia and syncope prior to  pacemaker implantation.  She has done quite well since that time, but  recently she has developed some symptoms of palpitations, which she  describes as a fluttering or irregularity of her heart.  She has had  several episodes of these and the longest this lasted about 2 hours.  She came in for a pacer check to the pacer clinic and was interrogated  and was found to have to modes, which is long as this was 1 hour.  Her  estimated longevity on her pacemaker was 2 months.   PAST MEDICAL HISTORY:  Significant for chronic septal pulmonary disease.  She has had a previous lobectomy for bronchiectasis and has an FEV-1 of  1.0.  She also has history of hypertension.   CURRENT MEDICATIONS:  Toprol.   PHYSICAL EXAMINATION:  VITAL SIGNS:  Blood pressure is 130/64 and pulse  68 and regular.  There was no venous tension.  The carotid pulses were  full without bruits.  CHEST:  Clear.  CARDIAC:  Rhythm was regular.  I could hear no murmurs or gallops.  The  pacer site had no erythema or swelling.  The peripheral pulses were full  with no peripheral edema.   Electrocardiogram showed atrial tracking ventricular pacing.   IMPRESSION:  1. Palpitations and documented mode switches probably related to      paroxysmal atrial fibrillation.  2. Status post St. Jude Affinity DDD pacemaker implantation in 1999      for second-degree AV block  with estimated longevity of about 2      months.  3. Previous pocket stimulation in a unipolar mode resolved with      bipolar pacing.  4. Chronic obstructive pulmonary disease with a partial lobectomy for      bronchiectasis and FEV-1 of 1.0.   RECOMMENDATIONS:  It seems probable that Lauren Buchanan has paroxysmal  atrial fibrillation.  We will see if we can document this.  With  hypertension and age 69, her CHADS score would be two and she would have  indications for Coumadin therapy if we document atrial fibrillation.  We  will schedule followup in 6 weeks.  She did not have her thresholds  measured, so I want to  do that.  I want to recheck her check her for ERI, when she comes in as  well as mode switches.  I want to reinterrogate her pacemaker when she  comes back for her followup visit.     Bruce Elvera Lennox Juanda Chance, MD,  [facility]  Electronically Signed    BRB/MedQ  DD: 08/01/2008  DT: 08/01/2008  Job #: [id number]

## 2011-03-03 NOTE — Cardiovascular Report (Signed)
NAMELARAYA, Lauren Buchanan NO.:  [id number]   MEDICAL RECORD NO.:  [id number]          PATIENT TYPE:  OIB   LOCATION:  2899                         FACILITY:  [facility]   PHYSICIAN:  Everardo Beals. Juanda Chance, MD, FACCDATE OF BIRTH:  1924-10-31   DATE OF PROCEDURE:  09/06/2008  DATE OF DISCHARGE:  09/06/2008                            CARDIAC CATHETERIZATION   CLINICAL HISTORY:  Ms. Valadez is 77 years old and had a St. Jude  Affinity DDD pacemaker implanted in 1999 for high-degree AV block.  She  recently reached ERI and was brought in for generator change.  She is  pacer dependent.   PROCEDURE:  Explantation of the old Affinity DDD pacemaker implanted on  July 31, 1998, inspection of the old atrial lead (model number  1388TC, serial number [id number], implanted on July 31, 1998, and  inspection of the ventricular lead (model number 1388TC, serial number  [id number], date of implant July 31, 1998) and implantation of a new St.  Jude Grayson DDD pacemaker (model number [id number], serial number [id number]).   ANESTHESIA:  1% local Xylocaine.   ESTIMATED BLOOD LOSS:  Less than 10 mL.   COMPLICATIONS:  None.   PROCEDURE:  The procedure was performed in laboratory room #3.  The left  anterior chest was prepped and draped in usual fashion.  The skin and  subcutaneous tissue were anesthetized with 1% local Xylocaine.  The  incision was extended to the pocket and the pocket was opened and the  generator was removed.  The leads were inspected with good pacing  parameters got below.  The pocket was irrigated with sterile kanamycin  solution.  The leads were attached to the new generator with a hex nut  wrench.  The patient was pacer dependent, but the transfer from the old  generator to the pacing electrode and from the pacing electrodes to new  generator went smoothly.  The subcutaneous tissue was closed with  running 2-0 Vicryl.  Skin was closed with running 4-0 Vicryl.  The  patient  tolerated the procedure well and left the laboratory in  satisfactory condition.   T-wave 1.5 mV, minimum threshold capture 1.3 volts at a pulse width of  0.5 milliseconds, and impedance 464 ohms.   Ventricular lead no R-wave was measured.  The threshold was 1.0 volts at  a pulse width of 0.5 milliseconds.  Impedance was 390 ohms.      Bruce Elvera Lennox Juanda Chance, MD, [facility]  Electronically Signed    BRB/MEDQ  D:  09/06/2008  T:  09/06/2008  Job:  [id number]   cc:   Tinnie Gens A. Tawanna Cooler, MD

## 2011-03-06 NOTE — Assessment & Plan Note (Signed)
[facility]                            CARDIOLOGY OFFICE NOTE   Lauren Buchanan, Lauren Buchanan                     MRN:          [id number]  DATE:11/25/2006                            DOB:          08/25/1925    PRIMARY CARE PHYSICIAN:  Tinnie Gens A. Tawanna Cooler, MD.   CLINICAL HISTORY:  Lauren Buchanan is 77 years old and has a St. Jude  Affinity DDD pacemaker, implanted in 1999 for high degree AV block.  She  had an episode of Torsades associated with bradycardia and syncope at  the time of her presentation.   She has done quite well since that time.  She has had no recent chest  pain, shortness of breath, or palpitations.   PAST MEDICAL HISTORY:  Chronic obstructive pulmonary disease with an  FEV1 of 1.0.  She has had a previous lobectomy for bronchiectasis.  I  believe she has a past history of hypertension.   CURRENT MEDICATIONS:  Include Toprol, glucosamine, and vitamins.   EXAMINATION:  The blood pressure is 122/66 and the pulse 87 and regular.  There was no venous distention.  The carotid pulses were full without  bruits.  CHEST:  Clear.  CARDIAC:  Rhythm was regular.  I heard no murmurs or gallops.  ABDOMEN:  Soft without organomegaly.  Peripheral pulses were full, there was no peripheral edema.   Her electrocardiogram showed pacing of both chambers.  We interrogated  her pacemaker, she had good thresholds on both chambers and good  impedence on both chambers.  She was not programmed to autocapture  because she had some pocket stimulation in the unipolar mode.  She has  atrial sensing 91% of the time and atrial pacing the other 10% of the  time.   IMPRESSION:  1. Status post St. Jude Affinity DDD pacemaker implantation 1999 for a      second degree AV block, now stable with good pacer function/pacer      dependent.  2. Previous pocket stimulation in the unipolar mode, now resolved with      bipolar pacing.  3. Chronic obstructive pulmonary disease,  status post partial      pneumonectomy for bronchiectasis with an FEV1 of 1.0.   DISPOSITION/RECOMMENDATIONS:  We will plan to follow the patient on  telephone check and see her back in a year.    Bruce Elvera Lennox Juanda Chance, MD, [facility]  Electronically Signed   BRB/MedQ  DD: 11/25/2006  DT: 11/25/2006  Job #: [id number]

## 2011-03-23 ENCOUNTER — Encounter: Payer: Self-pay | Admitting: Internal Medicine

## 2011-03-24 ENCOUNTER — Other Ambulatory Visit: Payer: Self-pay | Admitting: Family Medicine

## 2011-04-06 ENCOUNTER — Ambulatory Visit (INDEPENDENT_AMBULATORY_CARE_PROVIDER_SITE_OTHER): Payer: Medicare Other | Admitting: Internal Medicine

## 2011-04-06 ENCOUNTER — Encounter: Payer: Self-pay | Admitting: Internal Medicine

## 2011-04-06 VITALS — BP 118/62 | HR 60 | Resp 16 | Ht 60.0 in | Wt 123.0 lb

## 2011-04-06 DIAGNOSIS — I442 Atrioventricular block, complete: Secondary | ICD-10-CM

## 2011-04-06 NOTE — Assessment & Plan Note (Signed)
Normal pacemaker function See Pace Art report No changes today  

## 2011-04-06 NOTE — Patient Instructions (Signed)
Your physician wants you to follow-up in: 12 months with Dr. Allred. You will receive a reminder letter in the mail two months in advance. If you don't receive a letter, please call our office to schedule the follow-up appointment.  Your physician recommends that you continue on your current medications as directed. Please refer to the Current Medication list given to you today.  

## 2011-04-06 NOTE — Progress Notes (Signed)
The patient presents today for routine electrophysiology followup.  Since last being seen in our clinic, the patient reports doing very well.  She was recently evaluated in the hospital after her surgery for L wrist fx and noted to have normal pacemaker function with PACs/ pvcs.   Her SOB is stable.  Today, she denies symptoms of palpitations, chest pain, orthopnea, PND, lower extremity edema, dizziness, presyncope, syncope, or neurologic sequela.  The patient feels that she is tolerating medications without difficulties and is otherwise without complaint today.   Past Medical History  Diagnosis Date  . DJD (degenerative joint disease)   . IBS (irritable bowel syndrome)   . Goiter   . Complete heart block 1999    s/p PPM by Dr Juanda Chance, most recent generator change 2009  . Renal cyst    Past Surgical History  Procedure Date  . Pacemaker insertion 1999    Gen change (SJM) by Dr Juanda Chance 2009  . Cholecystectomy   . Left lower lobectomy for brochiectasis 1950  . Hemorrhoidectomy   . Renal cyst excision     Current Outpatient Prescriptions  Medication Sig Dispense Refill  . albuterol (PROAIR HFA) 108 (90 BASE) MCG/ACT inhaler Inhale 2 puffs into the lungs every 6 (six) hours as needed.        . Calcium Citrate-Vitamin D (CITRACAL PETITES/VITAMIN D) 200-250 MG-UNIT TABS Take 1 tablet by mouth daily.        . Cholecalciferol (VITAMIN D3) 2000 UNITS capsule Take 2,000 Units by mouth daily.        Marland Kitchen GLUCOSAMINE PO Take 1 tablet by mouth daily.        . hyoscyamine (LEVSIN SL) 0.125 MG SL tablet Place 0.125 mg under the tongue every 4 (four) hours as needed.        . loratadine (CLARITIN) 10 MG tablet Take 10 mg by mouth daily.        . metoprolol (TOPROL-XL) 100 MG 24 hr tablet TAKE ONE-HALF TABLET BY MOUTH EVERY MORNING  90 tablet  1  . Omega-3 350 MG CAPS Take 1 capsule by mouth daily.        Marland Kitchen omeprazole (PRILOSEC) 20 MG capsule Take 20 mg by mouth daily.        . vitamin B-12 (CYANOCOBALAMIN)  500 MCG tablet Take 500 mcg by mouth daily.        . vitamin C (ASCORBIC ACID) 500 MG tablet Take 500 mg by mouth daily.          Allergies  Allergen Reactions  . WGN:FAOZHYQMVHQ+IONGEXBMW+UXLKGMWNUU Acid+Aspartame   . Loratadine     History   Social History  . Marital Status: Married    Spouse Name: N/A    Number of Children: N/A  . Years of Education: N/A   Occupational History  . Retired    Social History Main Topics  . Smoking status: Never Smoker   . Smokeless tobacco: Not on file  . Alcohol Use: No  . Drug Use: No  . Sexually Active: Not on file   Other Topics Concern  . Not on file   Social History Narrative  . No narrative on file    Family History  Problem Relation Age of Onset  . Heart disease Mother   . Breast cancer Mother   . Stomach cancer Father    Physical Exam: Filed Vitals:   04/06/11 1056  BP: 118/62  Pulse: 60  Resp: 16  Height: 5' (1.524 m)  Weight: 123 lb (  55.792 kg)    GEN- The patient is well appearing, alert and oriented x 3 today.   Head- normocephalic, atraumatic Eyes-  Sclera clear, conjunctiva pink Ears- hearing intact Oropharynx- clear Neck- supple, no JVP Lymph- no cervical lymphadenopathy Lungs- Clear to ausculation bilaterally, normal work of breathing Chest- pacemaker pocket is well healed Heart- Regular rate and rhythm, 2/6 SEM LUSB early peaking, no rubs or gallops, PMI not laterally displaced GI- soft, NT, ND, + BS Extremities- no clubbing, cyanosis, or edema MS- no significant deformity or atrophy Skin- no rash or lesion Psych- euthymic mood, full affect Neuro- strength and sensation are intact  Pacemaker interrogation- reviewed in detail today,  See PACEART report  Assessment and Plan:

## 2011-05-15 ENCOUNTER — Encounter: Payer: Self-pay | Admitting: Internal Medicine

## 2011-05-19 ENCOUNTER — Ambulatory Visit: Payer: Self-pay | Admitting: Pulmonary Disease

## 2011-06-02 ENCOUNTER — Encounter: Payer: Self-pay | Admitting: Internal Medicine

## 2011-06-02 DIAGNOSIS — I441 Atrioventricular block, second degree: Secondary | ICD-10-CM

## 2011-07-24 ENCOUNTER — Ambulatory Visit (INDEPENDENT_AMBULATORY_CARE_PROVIDER_SITE_OTHER): Payer: Medicare Other

## 2011-07-24 DIAGNOSIS — Z23 Encounter for immunization: Secondary | ICD-10-CM

## 2011-08-27 ENCOUNTER — Encounter: Payer: Self-pay | Admitting: Family Medicine

## 2011-08-27 ENCOUNTER — Ambulatory Visit (INDEPENDENT_AMBULATORY_CARE_PROVIDER_SITE_OTHER): Payer: Medicare Other | Admitting: Family Medicine

## 2011-08-27 VITALS — BP 110/80 | Temp 98.0°F | Wt 124.0 lb

## 2011-08-27 DIAGNOSIS — J45901 Unspecified asthma with (acute) exacerbation: Secondary | ICD-10-CM

## 2011-08-27 MED ORDER — PREDNISONE 20 MG PO TABS: ORAL_TABLET | ORAL | Status: DC

## 2011-08-27 NOTE — Patient Instructions (Signed)
Taper the prednisone by taking a half a tablet a day until next week, then, a half a tablet Monday, Wednesday, Friday, for a 3-week taper

## 2011-08-27 NOTE — Progress Notes (Signed)
  Subjective:    Patient ID: MALAJAH OCEGUERA, female    DOB: Jul 03, 1925, 77 y.o.   MRN: [id number]  HPI D.  is a 77 year old Female nonsmoker, who comes in today for evaluation of a cough.  About two weeks ago she began coughing and restart her prednisone.  She is tender, half a tablet a day, and feels much better.  She wants to know if she needs an antibiotic.  She has no fever no sputum production   Review of Systems    General pulmonary is systems otherwise negative Objective:   Physical Exam Well-developed well-nourished, thin, female, in no acute distress.  Examination of the head, eyes, ears, nose, and throat are negative.  Neck was supple.  No adenopathy.  Lungs are clear       Assessment & Plan:  Asthma resolving.  Plan Taper prednisone slowly return p.r.n. And TBI is not indicated

## 2011-09-01 ENCOUNTER — Encounter: Payer: Self-pay | Admitting: Internal Medicine

## 2011-09-01 ENCOUNTER — Encounter: Payer: Self-pay | Admitting: Family Medicine

## 2011-09-01 ENCOUNTER — Encounter: Payer: Self-pay | Admitting: Neurology

## 2011-09-01 ENCOUNTER — Ambulatory Visit (INDEPENDENT_AMBULATORY_CARE_PROVIDER_SITE_OTHER): Payer: Medicare Other | Admitting: Family Medicine

## 2011-09-01 DIAGNOSIS — J3089 Other allergic rhinitis: Secondary | ICD-10-CM

## 2011-09-01 DIAGNOSIS — M1990 Unspecified osteoarthritis, unspecified site: Secondary | ICD-10-CM

## 2011-09-01 DIAGNOSIS — E049 Nontoxic goiter, unspecified: Secondary | ICD-10-CM

## 2011-09-01 DIAGNOSIS — I441 Atrioventricular block, second degree: Secondary | ICD-10-CM

## 2011-09-01 DIAGNOSIS — R413 Other amnesia: Secondary | ICD-10-CM

## 2011-09-01 DIAGNOSIS — K589 Irritable bowel syndrome without diarrhea: Secondary | ICD-10-CM

## 2011-09-01 LAB — POCT URINALYSIS DIPSTICK
Bilirubin, UA: NEGATIVE
Blood, UA: NEGATIVE
Nitrite, UA: NEGATIVE
Protein, UA: NEGATIVE
Spec Grav, UA: 1.01

## 2011-09-01 LAB — CBC WITH DIFFERENTIAL/PLATELET
Basophils Absolute: 0 10*3/uL (ref 0.0–0.1)
Eosinophils Relative: 3.3 % (ref 0.0–5.0)
Hemoglobin: 14.3 g/dL (ref 12.0–15.0)
Lymphocytes Relative: 22.5 % (ref 12.0–46.0)
MCV: 90.9 fl (ref 78.0–100.0)
Monocytes Absolute: 1 10*3/uL (ref 0.1–1.0)
Neutro Abs: 6.1 10*3/uL (ref 1.4–7.7)
Neutrophils Relative %: 63.8 % (ref 43.0–77.0)
Platelets: 309 10*3/uL (ref 150.0–400.0)

## 2011-09-01 MED ORDER — ALBUTEROL SULFATE HFA 108 (90 BASE) MCG/ACT IN AERS: 2.0000 | INHALATION_SPRAY | Freq: Four times a day (QID) | RESPIRATORY_TRACT | Status: DC | PRN

## 2011-09-01 MED ORDER — METOPROLOL SUCCINATE ER 100 MG PO TB24: 100.0000 mg | ORAL_TABLET | Freq: Every day | ORAL | Status: DC

## 2011-09-01 MED ORDER — HYOSCYAMINE SULFATE 0.125 MG SL SUBL: 0.1250 mg | SUBLINGUAL_TABLET | SUBLINGUAL | Status: DC | PRN

## 2011-09-01 NOTE — Progress Notes (Signed)
  Subjective:    Patient ID: Lauren Buchanan, female    DOB: [birth year], 77 y.o.   MRN: [id number]  HPI Lauren Buchanan is an 77 year old female, married, nonsmoker, who comes in today for a Medicare wellness examination.  She's had a history of allergic rhinitis, for which he takes over-the-counter Claritin.  She takes albuterol p.r.n. For asthma typically related to flares of viral infections.  She takes the probe.  All 50 mg daily BP at home unknown.  BP here 160/78.  She takes Levsin .125 mg sublingual p.r.n. For IBS.  She recently had her pacemaker checked and it was functioning normally.  She has routine eye care, profound hearing loss.  Recommend hearing aid.  No regular dental care, colonoscopy, normal, vaccinations tetanus 2008, Pneumovax, x 2, flu shot 2012, previously done.  She declines to the shingles vaccine because she would have to pay $290.  She does not have Medicare part the.  Cognitive function, normal, except she is concerned about some short-term memory loss.  Will set up a consult with Dr. Modesto Charon.  She walks on a daily basis.  Home health safety reviewed.  No issues identified, no guns in the house, she does have a healthcare power of attorney living will   Review of Systems  Constitutional: Negative.   HENT: Negative.   Eyes: Negative.   Respiratory: Negative.   Cardiovascular: Negative.   Gastrointestinal: Negative.   Genitourinary: Negative.   Musculoskeletal: Negative.   Neurological: Negative.   Hematological: Negative.   Psychiatric/Behavioral: Negative.        Objective:   Physical Exam  Constitutional: She appears well-developed and well-nourished.  HENT:  Head: Normocephalic and atraumatic.  Right Ear: External ear normal.  Left Ear: External ear normal.  Nose: Nose normal.  Mouth/Throat: Oropharynx is clear and moist.  Eyes: EOM are normal. Pupils are equal, round, and reactive to light.  Neck: Normal range of motion. Neck supple. No thyromegaly  present.  Cardiovascular: Normal rate, regular rhythm, normal heart sounds and intact distal pulses.  Exam reveals no gallop and no friction rub.   No murmur heard. Pulmonary/Chest: Effort normal and breath sounds normal.  Abdominal: Soft. Bowel sounds are normal. She exhibits no distension and no mass. There is no tenderness. There is no rebound.  Genitourinary:       Bilateral breast exam normal  Musculoskeletal: Normal range of motion.  Lymphadenopathy:    She has no cervical adenopathy.  Neurological: She is alert. She has normal reflexes. No cranial nerve deficit. She exhibits normal muscle tone. Coordination normal.  Skin: Skin is warm and dry.       Scar on the back from previous lobectomy for bronchiectasis, the scar, right upper quadrant from previous cholecystectomy, scar, left upper anterior chest wall pacemaker  Psychiatric: She has a normal mood and affect. Her behavior is normal. Judgment and thought content normal.          Assessment & Plan:  Healthy female.  Short-term memory loss consult with Dr. Denton Meek.  Hypertension.  Continue beta blocker 50 mg daily.  Allergic rhinitis.  Continue Claritin OTC.  Occasional asthma.  Continue albuterol p.r.n.  IBS continue Levsin p.r.n.  Pacemaker follow-up in cardiology

## 2011-09-01 NOTE — Patient Instructions (Signed)
Continue current medications.  We will get to set up a consult with Dr. Denton Meek for a neurologic evaluation of the memory loss.  Return in one year, sooner if any problems.  Remember to do a thorough breast exam monthly

## 2011-09-02 LAB — TSH: TSH: 0.67 u[IU]/mL (ref 0.35–5.50)

## 2011-09-02 LAB — BASIC METABOLIC PANEL
BUN: 14 mg/dL (ref 6–23)
Chloride: 102 mEq/L (ref 96–112)
GFR: 90.69 mL/min (ref >60.00)
Glucose, Bld: 89 mg/dL (ref 70–99)
Sodium: 139 mEq/L (ref 135–145)

## 2011-09-02 LAB — HEPATIC FUNCTION PANEL
Albumin: 3.7 g/dL (ref 3.5–5.2)
Alkaline Phosphatase: 104 U/L (ref 39–117)
Bilirubin, Direct: 0.1 mg/dL (ref 0.0–0.3)

## 2011-09-07 NOTE — Progress Notes (Signed)
Quick Note:  Pt husband informed ______ 

## 2011-10-05 ENCOUNTER — Ambulatory Visit: Payer: Medicare Other | Admitting: Neurology

## 2011-12-01 ENCOUNTER — Encounter: Payer: Self-pay | Admitting: Internal Medicine

## 2011-12-01 DIAGNOSIS — I441 Atrioventricular block, second degree: Secondary | ICD-10-CM

## 2011-12-09 ENCOUNTER — Other Ambulatory Visit: Payer: Self-pay | Admitting: *Deleted

## 2011-12-09 MED ORDER — HYDROCODONE-HOMATROPINE 5-1.5 MG/5ML PO SYRP: 5.0000 mL | ORAL_SOLUTION | Freq: Three times a day (TID) | ORAL | Status: DC | PRN

## 2012-01-01 ENCOUNTER — Ambulatory Visit (INDEPENDENT_AMBULATORY_CARE_PROVIDER_SITE_OTHER): Payer: Medicare Other | Admitting: Pulmonary Disease

## 2012-01-01 ENCOUNTER — Telehealth: Payer: Self-pay | Admitting: Pulmonary Disease

## 2012-01-01 ENCOUNTER — Encounter: Payer: Self-pay | Admitting: Pulmonary Disease

## 2012-01-01 VITALS — BP 114/80 | HR 71 | Temp 98.4°F | Ht 60.0 in | Wt 124.8 lb

## 2012-01-01 DIAGNOSIS — J3089 Other allergic rhinitis: Secondary | ICD-10-CM

## 2012-01-01 DIAGNOSIS — E049 Nontoxic goiter, unspecified: Secondary | ICD-10-CM

## 2012-01-01 DIAGNOSIS — R413 Other amnesia: Secondary | ICD-10-CM

## 2012-01-01 DIAGNOSIS — K589 Irritable bowel syndrome without diarrhea: Secondary | ICD-10-CM

## 2012-01-01 DIAGNOSIS — J479 Bronchiectasis, uncomplicated: Secondary | ICD-10-CM

## 2012-01-01 DIAGNOSIS — J449 Chronic obstructive pulmonary disease, unspecified: Secondary | ICD-10-CM

## 2012-01-01 DIAGNOSIS — M1990 Unspecified osteoarthritis, unspecified site: Secondary | ICD-10-CM

## 2012-01-01 MED ORDER — ALBUTEROL SULFATE HFA 108 (90 BASE) MCG/ACT IN AERS: 2.0000 | INHALATION_SPRAY | Freq: Four times a day (QID) | RESPIRATORY_TRACT | Status: DC | PRN

## 2012-01-01 NOTE — Assessment & Plan Note (Signed)
The patient has a history of bronchiectasis, but really has not seen an increase in her quantity of mucus.  It is a little more discolored than usual, but I would like to give this a little more time before considering treatment with antibiotics.  She is to call me if her quantity of mucus worsens.

## 2012-01-01 NOTE — Progress Notes (Signed)
  Subjective:    Patient ID: Lauren Buchanan, female    DOB: 06/06/1925, 78 y.o.   MRN: [id number]  HPI The patient comes in today for followup of her known bronchiectasis with COPD.  She has not been seen in quite some time, and comes in today for an acute sick visit.  She has noticed increasing shortness of breath, chest tightness, and increasing cough with discolored mucus.  Her quantity of mucus is fairly small, and is not that far above her usual baseline.  She has not had any fevers, chills, or sweats.  She has wanted to stay off maintenance inhalers in the past, and has recently run out of her rescue inhaler.   Review of Systems  Constitutional: Negative for fever and unexpected weight change.  HENT: Positive for rhinorrhea and postnasal drip. Negative for ear pain, nosebleeds, congestion, sore throat, sneezing, trouble swallowing, dental problem and sinus pressure.   Eyes: Positive for redness and itching.  Respiratory: Positive for cough, chest tightness, shortness of breath and wheezing.   Cardiovascular: Negative for palpitations and leg swelling.  Gastrointestinal: Negative for nausea and vomiting.  Genitourinary: Negative for dysuria.  Musculoskeletal: Negative for joint swelling.  Skin: Negative for rash.  Neurological: Negative for headaches.  Hematological: Does not bruise/bleed easily.  Psychiatric/Behavioral: Negative for dysphoric mood. The patient is not nervous/anxious.        Objective:   Physical Exam Thin female in no acute distress Nose without purulence or discharge noted Chest with left basilar crackles, scattered wheezes and rhonchi throughout, but adequate air flow. Cardiac exam with regular rate and rhythm Lower extremities without edema, no cyanosis noted Alert and oriented, moves all 4 extremities.       Assessment & Plan:

## 2012-01-01 NOTE — Patient Instructions (Signed)
Start dulera 100/5 2 inhalations every am and pm for 2-3 weeks, then can decrease to just am when feeling better.  Rinse mouth very well and gargle. Can use rescue inhaler as needed for breakthru symptoms. Please call if you are not improving. followup with me in 6mos to check on your progress.

## 2012-01-01 NOTE — Telephone Encounter (Signed)
I spoke with pt and she is scheduled to come in and see KC this afternoon at 4:15 for OC since she was last seen 11/18/10 and told to f/u in 6 months. Pt states she will just get refill when she comes in and nothing further was needed

## 2012-01-01 NOTE — Assessment & Plan Note (Signed)
The patient has known mild air flow obstruction that I think is related to her bronchiectasis.  She has definite wheezing on exam today, and her symptoms are most consistent with an acute flare.  She has wanted to avoid maintenance medications, especially inhaled corticosteroids.  However, she is also somewhat prednisone intolerant.  I have had a long discussion with her about this, and she is agreeable to trying a maintenance inhaler to see if it will resolve her current symptoms.

## 2012-01-05 ENCOUNTER — Telehealth: Payer: Self-pay | Admitting: Pulmonary Disease

## 2012-01-05 NOTE — Telephone Encounter (Signed)
Spoke with pt. She states that at last ov she told KC that she did not feel that claritin has helped with her allergy symptoms and he rec something else better for her to take that is OTC but she could not recall the name. I advised try chlortrimeton otc. She states that she remembers now that this is the med [facility] had mentioned and will go pick some up today. She states nothing further needed.

## 2012-03-01 ENCOUNTER — Encounter: Payer: Self-pay | Admitting: Internal Medicine

## 2012-03-01 DIAGNOSIS — I441 Atrioventricular block, second degree: Secondary | ICD-10-CM

## 2012-03-02 ENCOUNTER — Ambulatory Visit (INDEPENDENT_AMBULATORY_CARE_PROVIDER_SITE_OTHER): Payer: Medicare Other | Admitting: Family

## 2012-03-02 ENCOUNTER — Encounter: Payer: Self-pay | Admitting: Family

## 2012-03-02 VITALS — BP 130/84 | HR 67 | Temp 98.2°F | Resp 16 | Ht 60.0 in | Wt 122.0 lb

## 2012-03-02 DIAGNOSIS — R05 Cough: Secondary | ICD-10-CM

## 2012-03-02 DIAGNOSIS — R059 Cough, unspecified: Secondary | ICD-10-CM

## 2012-03-02 DIAGNOSIS — J069 Acute upper respiratory infection, unspecified: Secondary | ICD-10-CM

## 2012-03-02 NOTE — Patient Instructions (Signed)
[facility] OTC    Upper Respiratory Infection, Adult An upper respiratory infection (URI) is also sometimes known as the common cold. The upper respiratory tract includes the nose, sinuses, throat, trachea, and bronchi. Bronchi are the airways leading to the lungs. Most people improve within 1 week, but symptoms can last up to 2 weeks. A residual cough may last even longer.  CAUSES Many different viruses can infect the tissues lining the upper respiratory tract. The tissues become irritated and inflamed and often become very moist. Mucus production is also common. A cold is contagious. You can easily spread the virus to others by oral contact. This includes kissing, sharing a glass, coughing, or sneezing. Touching your mouth or nose and then touching a surface, which is then touched by another person, can also spread the virus. SYMPTOMS  Symptoms typically develop 1 to 3 days after you come in contact with a cold virus. Symptoms vary from person to person. They may include:  Runny nose.   Sneezing.   Nasal congestion.   Sinus irritation.   Sore throat.   Loss of voice (laryngitis).   Cough.   Fatigue.   Muscle aches.   Loss of appetite.   Headache.   Low-grade fever.  DIAGNOSIS  You might diagnose your own cold based on familiar symptoms, since most people get a cold 2 to 3 times a year. Your caregiver can confirm this based on your exam. Most importantly, your caregiver can check that your symptoms are not due to another disease such as strep throat, sinusitis, pneumonia, asthma, or epiglottitis. Blood tests, throat tests, and X-rays are not necessary to diagnose a common cold, but they may sometimes be helpful in excluding other more serious diseases. Your caregiver will decide if any further tests are required. RISKS AND COMPLICATIONS  You may be at risk for a more severe case of the common cold if you smoke cigarettes, have chronic heart disease (such as heart failure) or  lung disease (such as asthma), or if you have a weakened immune system. The very young and very old are also at risk for more serious infections. Bacterial sinusitis, middle ear infections, and bacterial pneumonia can complicate the common cold. The common cold can worsen asthma and chronic obstructive pulmonary disease (COPD). Sometimes, these complications can require emergency medical care and may be life-threatening. PREVENTION  The best way to protect against getting a cold is to practice good hygiene. Avoid oral or hand contact with people with cold symptoms. Wash your hands often if contact occurs. There is no clear evidence that vitamin C, vitamin E, echinacea, or exercise reduces the chance of developing a cold. However, it is always recommended to get plenty of rest and practice good nutrition. TREATMENT  Treatment is directed at relieving symptoms. There is no cure. Antibiotics are not effective, because the infection is caused by a virus, not by bacteria. Treatment may include:  Increased fluid intake. Sports drinks offer valuable electrolytes, sugars, and fluids.   Breathing heated mist or steam (vaporizer or shower).   Eating chicken soup or other clear broths, and maintaining good nutrition.   Getting plenty of rest.   Using gargles or lozenges for comfort.   Controlling fevers with ibuprofen or acetaminophen as directed by your caregiver.   Increasing usage of your inhaler if you have asthma.  Zinc gel and zinc lozenges, taken in the first 24 hours of the common cold, can shorten the duration and lessen the severity of symptoms. Pain  medicines may help with fever, muscle aches, and throat pain. A variety of non-prescription medicines are available to treat congestion and runny nose. Your caregiver can make recommendations and may suggest nasal or lung inhalers for other symptoms.  HOME CARE INSTRUCTIONS   Only take over-the-counter or prescription medicines for pain, discomfort,  or fever as directed by your caregiver.   Use a warm mist humidifier or inhale steam from a shower to increase air moisture. This may keep secretions moist and make it easier to breathe.   Drink enough water and fluids to keep your urine clear or pale yellow.   Rest as needed.   Return to work when your temperature has returned to normal or as your caregiver advises. You may need to stay home longer to avoid infecting others. You can also use a face mask and careful hand washing to prevent spread of the virus.  SEEK MEDICAL CARE IF:   After the first few days, you feel you are getting worse rather than better.   You need your caregiver's advice about medicines to control symptoms.   You develop chills, worsening shortness of breath, or brown or red sputum. These may be signs of pneumonia.   You develop yellow or brown nasal discharge or pain in the face, especially when you bend forward. These may be signs of sinusitis.   You develop a fever, swollen neck glands, pain with swallowing, or white areas in the back of your throat. These may be signs of strep throat.  SEEK IMMEDIATE MEDICAL CARE IF:   You have a fever.   You develop severe or persistent headache, ear pain, sinus pain, or chest pain.   You develop wheezing, a prolonged cough, cough up blood, or have a change in your usual mucus (if you have chronic lung disease).   You develop sore muscles or a stiff neck.  Document Released: 03/31/2001 Document Revised: 09/24/2011 Document Reviewed: 02/06/2011 [facility] Patient Information 2012 [place], [place].

## 2012-03-02 NOTE — Progress Notes (Signed)
Subjective:    Patient ID: Lauren Buchanan, female    DOB: 06/07/1925, 78 y.o.   MRN: [id number]  HPI 78 year old white female, nonsmoker, patient of Dr. Tawanna Cooler is in today with complaints of cough, congestion x4 days. Cough is productive of green phlegm. She's been taking over-the-counter Claritin with no relief. Patient denies any chest pain or shortness of breath.   Review of Systems  Constitutional: Negative.   HENT: Positive for congestion, sneezing and postnasal drip. Negative for sore throat.   Eyes: Negative.   Respiratory: Positive for cough.   Cardiovascular: Negative.   Skin: Negative.   Neurological: Negative.   Hematological: Negative.   Psychiatric/Behavioral: Negative.    Past Medical History  Diagnosis Date  . DJD (degenerative joint disease)   . IBS (irritable bowel syndrome)   . Goiter   . Complete heart block 1999    s/p PPM by Dr Juanda Chance, most recent generator change 2009  . Renal cyst     History   Social History  . Marital Status: Married    Spouse Name: N/A    Number of Children: N/A  . Years of Education: N/A   Occupational History  . Retired    Social History Main Topics  . Smoking status: Never Smoker   . Smokeless tobacco: Not on file  . Alcohol Use: No  . Drug Use: No  . Sexually Active: Not on file   Other Topics Concern  . Not on file   Social History Narrative  . No narrative on file    Past Surgical History  Procedure Date  . Pacemaker insertion 1999    Gen change (SJM) by Dr Juanda Chance 2009  . Cholecystectomy   . Left lower lobectomy for brochiectasis 1950  . Hemorrhoidectomy   . Renal cyst excision     Family History  Problem Relation Age of Onset  . Heart disease Mother   . Breast cancer Mother   . Stomach cancer Father     Allergies  Allergen Reactions  . Amoxicillin-Pot Clavulanate   . Loratadine     Current Outpatient Prescriptions on File Prior to Visit  Medication Sig Dispense Refill  . albuterol (PROAIR  HFA) 108 (90 BASE) MCG/ACT inhaler Inhale 2 puffs into the lungs every 6 (six) hours as needed.  1 Inhaler  5  . Calcium Citrate-Vitamin D (CITRACAL PETITES/VITAMIN D) 200-250 MG-UNIT TABS Take 1 tablet by mouth daily.        . Cholecalciferol (VITAMIN D3) 2000 UNITS capsule Take 2,000 Units by mouth daily.        Marland Kitchen GLUCOSAMINE PO Take 1 tablet by mouth daily.        . hyoscyamine (LEVSIN SL) 0.125 MG SL tablet Place 1 tablet (0.125 mg total) under the tongue every 4 (four) hours as needed.  50 tablet  4  . metoprolol (TOPROL-XL) 100 MG 24 hr tablet Take 1 tablet (100 mg total) by mouth daily.  90 tablet  2  . Omega-3 350 MG CAPS Take 1 capsule by mouth daily.        Marland Kitchen omeprazole (PRILOSEC) 20 MG capsule Take 20 mg by mouth daily as needed.       . vitamin B-12 (CYANOCOBALAMIN) 500 MCG tablet Take 500 mcg by mouth daily.        . vitamin C (ASCORBIC ACID) 500 MG tablet Take 500 mg by mouth daily.        Marland Kitchen DISCONTD: loratadine (CLARITIN) 10 MG tablet Take 10  mg by mouth daily.          BP 130/84  Pulse 67  Temp(Src) 98.2 F (36.8 C) (Oral)  Resp 16  Ht 5' (1.524 m)  Wt 122 lb (55.339 kg)  BMI 23.83 kg/m2  SpO2 97%chart    Objective:   Physical Exam  Constitutional: She is oriented to person, place, and time. She appears well-developed and well-nourished.  HENT:  Right Ear: External ear normal.  Left Ear: External ear normal.  Nose: Nose normal.  Mouth/Throat: Oropharynx is clear and moist.  Neck: Normal range of motion. Neck supple.  Cardiovascular: Normal rate, regular rhythm and normal heart sounds.   Pulmonary/Chest: Effort normal and breath sounds normal.  Musculoskeletal: Normal range of motion.  Neurological: She is alert and oriented to person, place, and time.  Skin: Skin is warm and dry.  Psychiatric: She has a normal mood and affect.          Assessment & Plan:  Assessment: Upper Respiratory Infection, Cough  Plan: OTC symptomatic treatment. Declined  prednisone. Patient call the office if symptoms worsen or persist. Recheck a schedule, and when necessary.

## 2012-03-16 ENCOUNTER — Ambulatory Visit (INDEPENDENT_AMBULATORY_CARE_PROVIDER_SITE_OTHER): Payer: Medicare Other | Admitting: Family Medicine

## 2012-03-16 ENCOUNTER — Encounter: Payer: Self-pay | Admitting: Family Medicine

## 2012-03-16 DIAGNOSIS — J479 Bronchiectasis, uncomplicated: Secondary | ICD-10-CM

## 2012-03-16 MED ORDER — PREDNISONE 20 MG PO TABS: ORAL_TABLET | ORAL | Status: DC

## 2012-03-16 MED ORDER — HYDROCODONE-HOMATROPINE 5-1.5 MG/5ML PO SYRP: 2.5000 mL | ORAL_SOLUTION | Freq: Three times a day (TID) | ORAL | Status: AC | PRN

## 2012-03-16 NOTE — Patient Instructions (Signed)
Take the prednisone as directed,,,,,,,,,,,,,,, 2 tabs x3 days or until you feel a whole up better and then begin a slow taper  Return in 2 weeks for followup  Hydromet 1/2 teaspoon 3 times daily when necessary for cough

## 2012-03-16 NOTE — Progress Notes (Signed)
  Subjective:    Patient ID: ANNELIESE LEBLOND, female    DOB: August 24, 1925, 78 y.o.   MRN: [id number]  HPI Molley is an 78 year old female nonsmoker who has a history of bronchiectasis who comes in today for evaluation of coughing and wheezing for 3 days  She said no fever but a lot of head congestion postnasal drip and cough. History of allergic rhinitis and occasional asthma. She has albuterol but she has not been using it    Review of Systems General and pulmonary review of systems otherwise negative    Objective:   Physical Exam Well-developed and nourished female in no acute distress HEENT negative neck was supple no adenopathy lungs shows symmetrical decrease in breath sounds mild late expiratory symmetrical wheezing       Assessment & Plan:

## 2012-03-17 ENCOUNTER — Ambulatory Visit: Payer: Medicare Other | Admitting: Family Medicine

## 2012-03-17 ENCOUNTER — Telehealth: Payer: Self-pay | Admitting: Family Medicine

## 2012-03-17 NOTE — Telephone Encounter (Signed)
Patient can be contacted at [phone].

## 2012-03-17 NOTE — Telephone Encounter (Signed)
Lauren Buchanan please call Lauren Buchanan,,,,,,,,,, these  are common side effects from the prednisone,,,,,,,,,, advised not to take any prednisone today restart on Saturday one tablet daily for 5 days, a half a tab for 5 days, then a half a tablet Monday Wednesday Friday for a 2 week taper  OTC Prilosec one twice daily for GI side effects  Office visit next week if symptoms persist or medication does not help

## 2012-03-17 NOTE — Telephone Encounter (Signed)
Patient stated that with taking the prednisone she is worse, heavy nose drainage,stomach discomfort,indigestion,and insomnia. Please advise.

## 2012-03-17 NOTE — Telephone Encounter (Signed)
Spoke with patient.

## 2012-03-22 ENCOUNTER — Ambulatory Visit: Payer: Medicare Other | Admitting: Family Medicine

## 2012-03-30 ENCOUNTER — Ambulatory Visit (INDEPENDENT_AMBULATORY_CARE_PROVIDER_SITE_OTHER): Payer: Medicare Other | Admitting: Pulmonary Disease

## 2012-03-30 ENCOUNTER — Encounter: Payer: Self-pay | Admitting: Pulmonary Disease

## 2012-03-30 ENCOUNTER — Ambulatory Visit: Payer: Medicare Other | Admitting: Family Medicine

## 2012-03-30 VITALS — BP 116/74 | HR 70 | Temp 97.8°F | Ht 60.0 in | Wt 123.4 lb

## 2012-03-30 DIAGNOSIS — J449 Chronic obstructive pulmonary disease, unspecified: Secondary | ICD-10-CM

## 2012-03-30 DIAGNOSIS — J471 Bronchiectasis with (acute) exacerbation: Secondary | ICD-10-CM

## 2012-03-30 MED ORDER — LEVOFLOXACIN 750 MG PO TABS: 750.0000 mg | ORAL_TABLET | Freq: Every day | ORAL | Status: AC

## 2012-03-30 MED ORDER — LEVOFLOXACIN 750 MG PO TABS: 750.0000 mg | ORAL_TABLET | Freq: Every day | ORAL | Status: DC

## 2012-03-30 NOTE — Addendum Note (Signed)
Addended by: Michel Bickers A on: 03/30/2012 03:59 PM   Modules accepted: Orders

## 2012-03-30 NOTE — Patient Instructions (Addendum)
Will treat with levaquin 750mg  one each day for 7 days to treat your sinopulmonary infection. Continue chlorpheniramine for postnasal drip as needed. Finish up your prednisone prescription.  Stay on dulera 2 inhalations am and pm everyday for next 3 months to see if helps.  Rinse mouth well after using.  If doing well, keep followup apptm already scheduled with me.  If not improving, you need to call me.

## 2012-03-30 NOTE — Assessment & Plan Note (Signed)
By the patient's description, I suspect she is having a COPD flare.  The prednisone has helped her, but again she has not been using her inhaler on a regular basis as I asked her to.  I have again reviewed the concept of airway inflammation, and the role of inhaled corticosteroids.  I have asked her to stay on dulera on a regular basis.

## 2012-03-30 NOTE — Addendum Note (Signed)
Addended by: Michel Bickers A on: 03/30/2012 02:11 PM   Modules accepted: Orders

## 2012-03-30 NOTE — Assessment & Plan Note (Signed)
The patient is having increased quantity of purulent mucus, along with increased pulmonary symptoms.  I think she is going to require a course of antibiotics to get her through this process since it seems to be hanging on.  She has not been taking the dulera on a consistent basis, and I have asked her to do this to see if her acute exacerbations are limited.

## 2012-03-30 NOTE — Progress Notes (Signed)
  Subjective:    Patient ID: Lauren Buchanan, female    DOB: 1925-08-02, 78 y.o.   MRN: [id number]  HPI Patient comes in today for an acute sick visit.  She has known bronchiectasis with associated COPD, and was started on dulera at the last visit to see if that would help control her symptoms and acute exacerbations.  Unfortunately, the patient has taken this only on an as-needed basis, and most recently has had increasing chest congestion with increased quantity of mucus from her nose and head.  She has also had increasing shortness of breath and chest tightness.  She has been treated with a course of prednisone which she is currently finishing, but has not been on antibiotics.  Her mucus continues to be purulent appearing.   Review of Systems  Constitutional: Negative.  Negative for fever and unexpected weight change.  HENT: Positive for congestion and postnasal drip. Negative for ear pain, nosebleeds, sore throat, rhinorrhea, sneezing, trouble swallowing, dental problem and sinus pressure.   Eyes: Negative.  Negative for redness and itching.  Respiratory: Positive for cough, chest tightness and wheezing. Negative for shortness of breath.   Cardiovascular: Negative.  Negative for palpitations and leg swelling.  Gastrointestinal: Negative.  Negative for nausea and vomiting.  Genitourinary: Negative.  Negative for dysuria.  Musculoskeletal: Negative.  Negative for joint swelling.  Skin: Negative.  Negative for rash.  Neurological: Negative.  Negative for headaches.  Hematological: Negative.  Does not bruise/bleed easily.  Psychiatric/Behavioral: Negative.  Negative for dysphoric mood. The patient is not nervous/anxious.        Objective:   Physical Exam Thin female in no acute distress Nose without purulence or discharge noted Chest with scattered rhonchi and crackles, no wheezing Cardiac exam is regular rate and rhythm Lower extremities without edema, no cyanosis Alert and oriented,  moves all 4 extremities.       Assessment & Plan:

## 2012-04-13 ENCOUNTER — Encounter: Payer: Self-pay | Admitting: Internal Medicine

## 2012-04-13 ENCOUNTER — Ambulatory Visit (INDEPENDENT_AMBULATORY_CARE_PROVIDER_SITE_OTHER): Payer: Medicare Other | Admitting: Internal Medicine

## 2012-04-13 VITALS — BP 130/70 | HR 70 | Resp 18 | Ht 60.0 in | Wt 123.4 lb

## 2012-04-13 DIAGNOSIS — I442 Atrioventricular block, complete: Secondary | ICD-10-CM

## 2012-04-13 LAB — PACEMAKER DEVICE OBSERVATION
AL AMPLITUDE: 1.4 mv
BAMS-0001: 130 {beats}/min
BATTERY VOLTAGE: 2.79 V
DEVICE MODEL PM: 2233979
RV LEAD THRESHOLD: 0.875 V
VENTRICULAR PACING PM: 99

## 2012-04-13 NOTE — Patient Instructions (Signed)
Your physician wants you to follow-up in: 12 months with Dr Allred You will receive a reminder letter in the mail two months in advance. If you don't receive a letter, please call our office to schedule the follow-up appointment.  

## 2012-04-13 NOTE — Progress Notes (Signed)
PCP: Evette Georges, MD  Lauren Buchanan is a 78 y.o. female who presents today for routine electrophysiology followup.  Since last being seen in our clinic, the patient reports doing very well.  Today, she denies symptoms of palpitations, chest pain, shortness of breath,  lower extremity edema, dizziness, presyncope, or syncope.  The patient is otherwise without complaint today.   Past Medical History  Diagnosis Date  . DJD (degenerative joint disease)   . IBS (irritable bowel syndrome)   . Goiter   . Complete heart block 1999    s/p PPM by Dr Juanda Chance, most recent generator change 2009  . Renal cyst    Past Surgical History  Procedure Date  . Pacemaker insertion 1999    Gen change (SJM) by Dr Juanda Chance 2009  . Cholecystectomy   . Left lower lobectomy for brochiectasis 1950  . Hemorrhoidectomy   . Renal cyst excision     Current Outpatient Prescriptions  Medication Sig Dispense Refill  . albuterol (PROAIR HFA) 108 (90 BASE) MCG/ACT inhaler Inhale 2 puffs into the lungs every 6 (six) hours as needed.  1 Inhaler  5  . Calcium Citrate-Vitamin D (CITRACAL PETITES/VITAMIN D) 200-250 MG-UNIT TABS Take 1 tablet by mouth daily.        Marland Kitchen GLUCOSAMINE PO Take 1 tablet by mouth daily.        . hyoscyamine (LEVSIN SL) 0.125 MG SL tablet Place 1 tablet (0.125 mg total) under the tongue every 4 (four) hours as needed.  50 tablet  4  . metoprolol succinate (TOPROL-XL) 100 MG 24 hr tablet Take 50 mg by mouth daily.      . mometasone-formoterol (DULERA) 100-5 MCG/ACT AERO Inhale 2 puffs into the lungs daily as needed.      . vitamin B-12 (CYANOCOBALAMIN) 500 MCG tablet Take 500 mcg by mouth daily.        . vitamin C (ASCORBIC ACID) 500 MG tablet Take 500 mg by mouth daily.        . Cholecalciferol (VITAMIN D3) 2000 UNITS capsule Take 2,000 Units by mouth daily.        . Omega-3 350 MG CAPS Take 1 capsule by mouth daily.        Marland Kitchen omeprazole (PRILOSEC) 20 MG capsule Take 20 mg by mouth daily as  needed.       Marland Kitchen DISCONTD: loratadine (CLARITIN) 10 MG tablet Take 10 mg by mouth daily.          Physical Exam: Filed Vitals:   04/13/12 1133  BP: 130/70  Pulse: 70  Resp: 18  Height: 5' (1.524 m)  Weight: 123 lb 6.4 oz (55.974 kg)    GEN- The patient is well appearing, alert and oriented x 3 today.   Head- normocephalic, atraumatic Eyes-  Sclera clear, conjunctiva pink Ears- hearing intact Oropharynx- clear Lungs- Clear to ausculation bilaterally, normal work of breathing Chest- pacemaker pocket is well healed Heart- Regular rate and rhythm, no murmurs, rubs or gallops, PMI not laterally displaced GI- soft, NT, ND, + BS Extremities- no clubbing, cyanosis, or edema  Pacemaker interrogation- reviewed in detail today,  See PACEART report  Assessment and Plan:

## 2012-04-13 NOTE — Assessment & Plan Note (Signed)
Normal pacemaker function See Pace Art report No changes today  

## 2012-05-31 DIAGNOSIS — I441 Atrioventricular block, second degree: Secondary | ICD-10-CM

## 2012-07-05 ENCOUNTER — Ambulatory Visit (INDEPENDENT_AMBULATORY_CARE_PROVIDER_SITE_OTHER): Payer: Medicare Other | Admitting: Pulmonary Disease

## 2012-07-05 ENCOUNTER — Encounter: Payer: Self-pay | Admitting: Pulmonary Disease

## 2012-07-05 VITALS — BP 140/82 | HR 102 | Temp 98.3°F | Ht 60.0 in | Wt 122.0 lb

## 2012-07-05 DIAGNOSIS — J479 Bronchiectasis, uncomplicated: Secondary | ICD-10-CM

## 2012-07-05 DIAGNOSIS — J449 Chronic obstructive pulmonary disease, unspecified: Secondary | ICD-10-CM

## 2012-07-05 NOTE — Assessment & Plan Note (Signed)
The patient's breathing is better since she has been on dulera on a more consistent basis.  I've asked her to stay on this medication, and to rinse her mouth more aggressively to see if her dysphonia will improve.

## 2012-07-05 NOTE — Progress Notes (Signed)
  Subjective:    Patient ID: Lauren Buchanan, female    DOB: 1925/01/14, 78 y.o.   MRN: [id number]  HPI The patient comes in today for followup of her known bronchiectasis with COPD.  She has been staying on dulera on a regular basis, and thinks that it has helped her breathing.  Her only complaint is that of dysphonia, despite rinsing her mouth well.  She is not however gargling with water.  Overall, she feels that her pulmonary status has improved.  It should be noted that she is having a lot of anxiety associated with her husband's metastatic lung cancer.   Review of Systems  Constitutional: Negative for fever and unexpected weight change.  HENT: Positive for rhinorrhea. Negative for ear pain, nosebleeds, congestion, sore throat, sneezing, trouble swallowing, dental problem, postnasal drip and sinus pressure.   Eyes: Negative for redness and itching.  Respiratory: Positive for choking. Negative for cough, chest tightness, shortness of breath and wheezing.   Cardiovascular: Negative for palpitations and leg swelling.  Gastrointestinal: Negative for nausea and vomiting.  Genitourinary: Negative for dysuria.  Musculoskeletal: Negative for joint swelling.  Skin: Negative for rash.  Neurological: Negative for headaches.  Hematological: Bruises/bleeds easily.  Psychiatric/Behavioral: Negative for dysphoric mood. The patient is not nervous/anxious.        Objective:   Physical Exam Frail-appearing female in no acute distress Nose without purulence or discharge noted Chest with a few basilar crackles, no wheezes or rhonchi Cardiac exam with regular rate and rhythm Lower extremities without edema, no cyanosis Alert and oriented, moves all 4 extremities.       Assessment & Plan:

## 2012-07-05 NOTE — Patient Instructions (Addendum)
Will give you a flu shot today Stay on dulera, but rinse mouth well, then gargle, then SWALLOW.  Let me know if your voice changes persist. followup with me in 6mos, or sooner if having issues.

## 2012-08-30 DIAGNOSIS — I441 Atrioventricular block, second degree: Secondary | ICD-10-CM

## 2012-09-14 ENCOUNTER — Telehealth: Payer: Self-pay | Admitting: Pulmonary Disease

## 2012-09-14 MED ORDER — AEROCHAMBER PLUS MISC: Status: DC

## 2012-09-14 NOTE — Telephone Encounter (Signed)
Called and spoke with the pharmacy and they stated that the pt wanted to pick one up.  Pt stated that [facility] told her to get an aerochamber to use with her inhaler.  KC please advise if ok to send in rx for this .  Thanks  Allergies  Allergen Reactions  . Amoxicillin-Pot Clavulanate   . Loratadine

## 2012-09-14 NOTE — Telephone Encounter (Signed)
Fine with me

## 2012-09-14 NOTE — Telephone Encounter (Signed)
rx for the aerochamber has been sent in to pharmacy.  Nothing further is needed.

## 2012-11-15 ENCOUNTER — Ambulatory Visit (INDEPENDENT_AMBULATORY_CARE_PROVIDER_SITE_OTHER): Payer: Medicare Other | Admitting: Cardiology

## 2012-11-15 DIAGNOSIS — Z950 Presence of cardiac pacemaker: Secondary | ICD-10-CM

## 2012-11-15 DIAGNOSIS — I442 Atrioventricular block, complete: Secondary | ICD-10-CM

## 2012-11-15 LAB — PACEMAKER DEVICE OBSERVATION
AL IMPEDENCE PM: 375 Ohm
ATRIAL PACING PM: 58
BATTERY VOLTAGE: 2.81 V
RV LEAD THRESHOLD: 0.875 V

## 2012-11-15 NOTE — Progress Notes (Signed)
PPM check/device clinic only. See PaceArt report. 

## 2012-11-15 NOTE — Patient Instructions (Addendum)
Your physician wants you to follow-up in: 6 months with Dr. Allred. You will receive a reminder letter in the mail two months in advance. If you don't receive a letter, please call our office to schedule the follow-up appointment.  

## 2012-12-07 ENCOUNTER — Encounter: Payer: Self-pay | Admitting: Internal Medicine

## 2012-12-07 DIAGNOSIS — I441 Atrioventricular block, second degree: Secondary | ICD-10-CM

## 2012-12-13 ENCOUNTER — Ambulatory Visit (INDEPENDENT_AMBULATORY_CARE_PROVIDER_SITE_OTHER): Payer: Medicare Other | Admitting: Family Medicine

## 2012-12-13 ENCOUNTER — Encounter: Payer: Self-pay | Admitting: Family Medicine

## 2012-12-13 VITALS — BP 104/70 | Temp 98.8°F | Wt 122.0 lb

## 2012-12-13 DIAGNOSIS — H538 Other visual disturbances: Secondary | ICD-10-CM

## 2012-12-13 DIAGNOSIS — R5381 Other malaise: Secondary | ICD-10-CM | POA: Insufficient documentation

## 2012-12-13 DIAGNOSIS — R112 Nausea with vomiting, unspecified: Secondary | ICD-10-CM | POA: Insufficient documentation

## 2012-12-13 DIAGNOSIS — K589 Irritable bowel syndrome without diarrhea: Secondary | ICD-10-CM

## 2012-12-13 DIAGNOSIS — R110 Nausea: Secondary | ICD-10-CM

## 2012-12-13 LAB — CBC WITH DIFFERENTIAL/PLATELET
Basophils Absolute: 0 10*3/uL (ref 0.0–0.1)
Basophils Relative: 0.2 % (ref 0.0–3.0)
Eosinophils Absolute: 0.3 10*3/uL (ref 0.0–0.7)
Hemoglobin: 12.5 g/dL (ref 12.0–15.0)
Lymphocytes Relative: 10.2 % — ABNORMAL LOW (ref 12.0–46.0)
Lymphs Abs: 1.3 10*3/uL (ref 0.7–4.0)
MCHC: 33.5 g/dL (ref 30.0–36.0)
MCV: 85.3 fl (ref 78.0–100.0)
Monocytes Absolute: 1.7 10*3/uL — ABNORMAL HIGH (ref 0.1–1.0)
Neutro Abs: 9.2 10*3/uL — ABNORMAL HIGH (ref 1.4–7.7)
RBC: 4.37 Mil/uL (ref 3.87–5.11)
RDW: 13.5 % (ref 11.5–14.6)

## 2012-12-13 LAB — BASIC METABOLIC PANEL
BUN: 14 mg/dL (ref 6–23)
Calcium: 9.1 mg/dL (ref 8.4–10.5)
Creatinine, Ser: 0.7 mg/dL (ref 0.4–1.2)
GFR: 90.41 mL/min (ref >60.00)
Glucose, Bld: 88 mg/dL (ref 70–99)

## 2012-12-13 LAB — HEPATIC FUNCTION PANEL
Alkaline Phosphatase: 162 U/L — ABNORMAL HIGH (ref 39–117)
Bilirubin, Direct: 0 mg/dL (ref 0.0–0.3)
Total Bilirubin: 0.4 mg/dL (ref 0.3–1.2)
Total Protein: 7.1 g/dL (ref 6.0–8.3)

## 2012-12-13 LAB — AMYLASE: Amylase: 70 U/L (ref 27–131)

## 2012-12-13 MED ORDER — METOPROLOL SUCCINATE ER 25 MG PO TB24: ORAL_TABLET | ORAL | Status: DC

## 2012-12-13 NOTE — Patient Instructions (Signed)
Stop all the over-the-counter roots herbs and vitamins  Labs today  Decrease the Toprol to 12.5 mg daily  Check your blood pressure daily in the morning  Return in one month for followup

## 2012-12-13 NOTE — Progress Notes (Signed)
  Subjective:    Patient ID: Lauren Buchanan, female    DOB: [birth year], 79 y.o.   MRN: [id number]  HPI Inna is a 79 year old married female nonsmoker who comes in with a four-month history of nausea  She states about 4 months ago she began having symptoms of nausea and food not tasting good. She's had no fever vomiting diarrhea change in bowel habits.   Last Saturday she had a two-hour episode of blurred vision and went to see her ophthalmologist Dr. Rudi Heap. Her eye exam was normal except for dry eyes.  Her blood pressure on Toprol 50 mg a day is 104/70. She states she's not lightheaded when she stands up  She sees Dr. Mellody Dance in pulmonary for her COPD and her COPD is stable on the medication. She's also seeing cardiology today she has a pacemaker and that apparently is working well   Review of Systems Review of systems otherwise negative    Objective:   Physical Exam Well-developed well-nourished female no acute distress cardiopulmonary exam normal abdominal exam normal       Assessment & Plan:  Blurred vision question secondary to hypertension decrease Toprol to 12.5 mg daily BP check daily followup in one month  Nausea x4 months etiology unknown plan check basic labs stop over-the-counter roots and herbs

## 2012-12-14 ENCOUNTER — Other Ambulatory Visit: Payer: Self-pay | Admitting: Family Medicine

## 2012-12-14 DIAGNOSIS — R899 Unspecified abnormal finding in specimens from other organs, systems and tissues: Secondary | ICD-10-CM

## 2012-12-15 ENCOUNTER — Telehealth: Payer: Self-pay | Admitting: Family Medicine

## 2012-12-15 NOTE — Telephone Encounter (Signed)
Caller: Lauren Buchanan/Child; Phone: [phone]; Reason for Call: Daugher/Lauren Buchanan called to review message given to mother 12/15/12.  Lauren Buchanan has not yet been set up as HCPOA.  Concerned Lauren Buchanan is loosing her memory.  Lauren Buchanan received call from office nurse 12/15/12 explaining something about lab abnormality and referral to specialist.  Lauren Buchanan was confused about message and if she needs to contact the specialist herself.  Please call Lauren Buchanan back to clarify. Lauren Buchanan says Dr Tawanna Cooler knows her.

## 2012-12-15 NOTE — Telephone Encounter (Signed)
Spoke with patient.

## 2012-12-16 ENCOUNTER — Telehealth: Payer: Self-pay | Admitting: Family Medicine

## 2012-12-16 NOTE — Telephone Encounter (Signed)
Spoke with patient.

## 2012-12-16 NOTE — Telephone Encounter (Signed)
Prilosec one daily  Gaviscon 4 times daily one hour prior to meals and at bedtime

## 2012-12-16 NOTE — Telephone Encounter (Signed)
Patient Information:  Caller Name: Rosalee  Phone: [phone]  Patient: Lauren Buchanan, Lauren Buchanan  Gender: Female  DOB: Oct 27, 1924  Age: 79 Years  PCP: Kelle Darting [facility])  Office Follow Up:  Does the office need to follow up with this patient?: Yes  Instructions For The Office: Please follow up with patient - See nurses notes for details.  RN Note:  Patient states no change in symptoms since last evaluated.  Patient states that she is only able to eat ice cream and drink milk.  Patient states that when she eats she have a boiling feeling in her stomach with increased pain.  Patient states that she can get the food down if swallows milk after each bite.  States that milk eases the discomfort.  Patient has not yet heard from the Gastrointestinal specialist for appointment and states that she cannot wait a month before being seen as she cannot take the discomfort.  Patient wants to know if there is anything that can be done to assist with the discomfort.  Symptoms  Reason For Call & Symptoms: Nausea and unable to eat  Reviewed Health History In EMR: Yes  Reviewed Medications In EMR: Yes  Reviewed Allergies In EMR: Yes  Reviewed Surgeries / Procedures: Yes  Date of Onset of Symptoms: 08/19/2012  Guideline(s) Used:  No Protocol Available - Sick Adult  Disposition Per Guideline:   Discuss with PCP and Callback by Nurse Today  Reason For Disposition Reached:   Nursing judgment  Advice Given:  Call Back If:  New symptoms develop  You become worse.

## 2012-12-19 ENCOUNTER — Encounter: Payer: Self-pay | Admitting: Internal Medicine

## 2012-12-20 ENCOUNTER — Telehealth: Payer: Self-pay | Admitting: Internal Medicine

## 2012-12-20 NOTE — Telephone Encounter (Signed)
New problem    C/O for the past month or so . Sharp pain radial to her head . Went to PCP last week think it's might be stomach bug.

## 2012-12-20 NOTE — Telephone Encounter (Signed)
I have spoken with patient and let her know that as she has seen Dr Tawanna Cooler for all of these symptoms if she is not getting any better with the nausea,not being able to eat and just not feeling well then she should follow up with him.  She has done all the things he recommended and will call his office

## 2012-12-21 ENCOUNTER — Encounter: Payer: Self-pay | Admitting: Gastroenterology

## 2012-12-30 ENCOUNTER — Encounter (HOSPITAL_BASED_OUTPATIENT_CLINIC_OR_DEPARTMENT_OTHER): Payer: Self-pay | Admitting: *Deleted

## 2012-12-30 NOTE — Progress Notes (Signed)
Pt sees dr Tawanna Cooler- and has pacemaker-dr allred- Had labs 12/13/12- Will need ekg

## 2013-01-03 ENCOUNTER — Encounter (HOSPITAL_BASED_OUTPATIENT_CLINIC_OR_DEPARTMENT_OTHER): Payer: Self-pay | Admitting: Anesthesiology

## 2013-01-03 ENCOUNTER — Ambulatory Visit: Payer: Medicare Other | Admitting: Pulmonary Disease

## 2013-01-03 ENCOUNTER — Ambulatory Visit (HOSPITAL_BASED_OUTPATIENT_CLINIC_OR_DEPARTMENT_OTHER)
Admission: RE | Admit: 2013-01-03 | Discharge: 2013-01-03 | Disposition: A | Discharge status: Home / Self Care | Payer: Medicare Other | Source: Ambulatory Visit | Attending: Otolaryngology | Admitting: Otolaryngology

## 2013-01-03 ENCOUNTER — Ambulatory Visit (HOSPITAL_BASED_OUTPATIENT_CLINIC_OR_DEPARTMENT_OTHER): Payer: Medicare Other | Admitting: Anesthesiology

## 2013-01-03 ENCOUNTER — Encounter (HOSPITAL_BASED_OUTPATIENT_CLINIC_OR_DEPARTMENT_OTHER)
Admission: RE | Disposition: A | Discharge status: Home / Self Care | Payer: Self-pay | Source: Ambulatory Visit | Attending: Otolaryngology

## 2013-01-03 DIAGNOSIS — Z950 Presence of cardiac pacemaker: Secondary | ICD-10-CM | POA: Insufficient documentation

## 2013-01-03 DIAGNOSIS — J4489 Other specified chronic obstructive pulmonary disease: Secondary | ICD-10-CM | POA: Insufficient documentation

## 2013-01-03 DIAGNOSIS — K449 Diaphragmatic hernia without obstruction or gangrene: Secondary | ICD-10-CM | POA: Insufficient documentation

## 2013-01-03 DIAGNOSIS — M316 Other giant cell arteritis: Secondary | ICD-10-CM

## 2013-01-03 DIAGNOSIS — J449 Chronic obstructive pulmonary disease, unspecified: Secondary | ICD-10-CM | POA: Insufficient documentation

## 2013-01-03 DIAGNOSIS — I499 Cardiac arrhythmia, unspecified: Secondary | ICD-10-CM | POA: Insufficient documentation

## 2013-01-03 HISTORY — DX: Presence of spectacles and contact lenses: Z97.3

## 2013-01-03 HISTORY — PX: ARTERY BIOPSY: SHX891

## 2013-01-03 HISTORY — DX: Chronic obstructive pulmonary disease, unspecified: J44.9

## 2013-01-03 SURGERY — BIOPSY TEMPORAL ARTERY
Anesthesia: Monitor Anesthesia Care | Site: Face | Laterality: Left | Wound class: Clean Contaminated

## 2013-01-03 MED ORDER — HYDROCODONE-ACETAMINOPHEN 5-325 MG PO TABS: 1.0000 | ORAL_TABLET | ORAL | Status: DC | PRN

## 2013-01-03 MED ORDER — ONDANSETRON HCL 4 MG/2ML IJ SOLN
INTRAMUSCULAR | Status: DC | PRN
  Administered 2013-01-03: 4 mg via INTRAVENOUS

## 2013-01-03 MED ORDER — FENTANYL CITRATE 0.05 MG/ML IJ SOLN: 50.0000 ug | INTRAMUSCULAR | Status: DC | PRN

## 2013-01-03 MED ORDER — FENTANYL CITRATE 0.05 MG/ML IJ SOLN
INTRAMUSCULAR | Status: DC | PRN
  Administered 2013-01-03: 25 ug via INTRAVENOUS
  Administered 2013-01-03: 50 ug via INTRAVENOUS
  Administered 2013-01-03: 25 ug via INTRAVENOUS

## 2013-01-03 MED ORDER — LIDOCAINE-EPINEPHRINE 1 %-1:100000 IJ SOLN
INTRAMUSCULAR | Status: DC | PRN
  Administered 2013-01-03: 3 mL

## 2013-01-03 MED ORDER — LACTATED RINGERS IV SOLN
INTRAVENOUS | Status: DC
  Administered 2013-01-03 (×2): via INTRAVENOUS

## 2013-01-03 MED ORDER — MIDAZOLAM HCL 2 MG/2ML IJ SOLN: 1.0000 mg | INTRAMUSCULAR | Status: DC | PRN

## 2013-01-03 MED ORDER — PROPOFOL 10 MG/ML IV BOLUS
INTRAVENOUS | Status: DC | PRN
  Administered 2013-01-03 (×2): 5 mg via INTRAVENOUS
  Administered 2013-01-03 (×4): 10 mg via INTRAVENOUS

## 2013-01-03 MED ORDER — LIDOCAINE HCL (CARDIAC) 20 MG/ML IV SOLN
INTRAVENOUS | Status: DC | PRN
  Administered 2013-01-03: 40 mg via INTRAVENOUS

## 2013-01-03 MED ORDER — HYDROMORPHONE HCL PF 1 MG/ML IJ SOLN: 0.2500 mg | INTRAMUSCULAR | Status: DC | PRN

## 2013-01-03 MED ORDER — CEPHALEXIN 500 MG PO CAPS: 500.0000 mg | ORAL_CAPSULE | Freq: Three times a day (TID) | ORAL | Status: AC

## 2013-01-03 MED ORDER — 0.9 % SODIUM CHLORIDE (POUR BTL) OPTIME
TOPICAL | Status: DC | PRN
  Administered 2013-01-03: 150 mL

## 2013-01-03 SURGICAL SUPPLY — 50 items
ADH SKN CLS APL DERMABOND .7 (GAUZE/BANDAGES/DRESSINGS) ×1
APPLICATOR COTTON TIP 6IN STRL (MISCELLANEOUS) ×4 IMPLANT
BANDAGE ADHESIVE 1X3 (GAUZE/BANDAGES/DRESSINGS) IMPLANT
BENZOIN TINCTURE PRP APPL 2/3 (GAUZE/BANDAGES/DRESSINGS) IMPLANT
BLADE SURG 15 STRL LF DISP TIS (BLADE) ×1 IMPLANT
BLADE SURG 15 STRL SS (BLADE) ×1
BLADE SURG ROTATE 9660 (MISCELLANEOUS) ×2 IMPLANT
CANISTER SUCTION 1200CC (MISCELLANEOUS) IMPLANT
CLEANER CAUTERY TIP 5X5 PAD (MISCELLANEOUS) ×1 IMPLANT
CORDS BIPOLAR (ELECTRODE) ×2 IMPLANT
COVER MAYO STAND STRL (DRAPES) ×2 IMPLANT
COVER TABLE BACK 60X90 (DRAPES) IMPLANT
DERMABOND ADVANCED (GAUZE/BANDAGES/DRESSINGS) ×1
DERMABOND ADVANCED .7 DNX12 (GAUZE/BANDAGES/DRESSINGS) ×1 IMPLANT
DRAPE MICROSCOPE WILD 40.5X102 (DRAPES) ×2 IMPLANT
DRAPE SURG 17X23 STRL (DRAPES) IMPLANT
DRAPE U-SHAPE 76X120 STRL (DRAPES) IMPLANT
ELECT COATED BLADE 2.86 ST (ELECTRODE) IMPLANT
ELECT NEEDLE BLADE 2-5/6 (NEEDLE) IMPLANT
ELECT REM PT RETURN 9FT ADLT (ELECTROSURGICAL) ×2
ELECTRODE REM PT RTRN 9FT ADLT (ELECTROSURGICAL) ×1 IMPLANT
GAUZE SPONGE 4X4 12PLY STRL LF (GAUZE/BANDAGES/DRESSINGS) IMPLANT
GLOVE BIO SURGEON STRL SZ7.5 (GLOVE) ×4 IMPLANT
GLOVE BIOGEL PI IND STRL 6.5 (GLOVE) ×1 IMPLANT
GLOVE BIOGEL PI INDICATOR 6.5 (GLOVE) ×1
GLOVE SKINSENSE NS SZ7.0 (GLOVE) ×1
GLOVE SKINSENSE STRL SZ7.0 (GLOVE) ×1 IMPLANT
GOWN PREVENTION PLUS XLARGE (GOWN DISPOSABLE) ×4 IMPLANT
NEEDLE 27GAX1X1/2 (NEEDLE) ×2 IMPLANT
NEEDLE HYPO 25X1 1.5 SAFETY (NEEDLE) IMPLANT
NS IRRIG 1000ML POUR BTL (IV SOLUTION) ×2 IMPLANT
PACK BASIN DAY SURGERY FS (CUSTOM PROCEDURE TRAY) ×2 IMPLANT
PAD CLEANER CAUTERY TIP 5X5 (MISCELLANEOUS) ×1
PENCIL BUTTON HOLSTER BLD 10FT (ELECTRODE) IMPLANT
SHEET MEDIUM DRAPE 40X70 STRL (DRAPES) ×2 IMPLANT
SPONGE GAUZE 2X2 8PLY STRL LF (GAUZE/BANDAGES/DRESSINGS) IMPLANT
STRIP CLOSURE SKIN 1/2X4 (GAUZE/BANDAGES/DRESSINGS) IMPLANT
SUCTION FRAZIER TIP 10 FR DISP (SUCTIONS) IMPLANT
SUT CHROMIC 4 0 P 3 18 (SUTURE) IMPLANT
SUT ETHILON 5 0 P 3 18 (SUTURE)
SUT NYLON ETHILON 5-0 P-3 1X18 (SUTURE) IMPLANT
SUT SILK 3 0 TIES 17X18 (SUTURE) ×1
SUT SILK 3-0 18XBRD TIE BLK (SUTURE) ×1 IMPLANT
SUT SILK 4 0 TIES 17X18 (SUTURE) IMPLANT
SUT VICRYL 4-0 PS2 18IN ABS (SUTURE) ×2 IMPLANT
SWABSTICK POVIDONE IODINE SNGL (MISCELLANEOUS) IMPLANT
SYR CONTROL 10ML LL (SYRINGE) ×2 IMPLANT
TOWEL OR 17X24 6PK STRL BLUE (TOWEL DISPOSABLE) ×2 IMPLANT
TRAY DSU PREP LF (CUSTOM PROCEDURE TRAY) ×2 IMPLANT
TUBE CONNECTING 20X1/4 (TUBING) IMPLANT

## 2013-01-03 NOTE — Transfer of Care (Signed)
Immediate Anesthesia Transfer of Care Note  Patient: Lauren Buchanan  Procedure(s) Performed: Procedure(s): BIOPSY LEFT TEMPORAL ARTERY (Left)  Patient Location: PACU  Anesthesia Type:MAC  Level of Consciousness: awake and alert   Airway & Oxygen Therapy: Patient Spontanous Breathing and Patient connected to face mask oxygen  Post-op Assessment: Report given to PACU RN and Post -op Vital signs reviewed and stable  Post vital signs: Reviewed and stable  Complications: No apparent anesthesia complications

## 2013-01-03 NOTE — H&P (Signed)
  H&P Update  Pt's original H&P dated 12/28/12 reviewed and placed in chart (to be scanned).  I personally examined the patient today.  No change in health. Proceed with temporal artery biopsy.

## 2013-01-03 NOTE — Anesthesia Procedure Notes (Signed)
Procedure Name: MAC Date/Time: 01/03/2013 9:50 AM Performed by: Caren Macadam Pre-anesthesia Checklist: Patient identified, Emergency Drugs available, Suction available and Patient being monitored Patient Re-evaluated:Patient Re-evaluated prior to inductionOxygen Delivery Method: Simple face mask Preoxygenation: Pre-oxygenation with 100% oxygen Placement Confirmation: positive ETCO2

## 2013-01-03 NOTE — Brief Op Note (Signed)
01/03/2013  11:18 AM  PATIENT:  Lauren Buchanan  79 y.o. female  PRE-OPERATIVE DIAGNOSIS:  Left Temporal Arteritis   POST-OPERATIVE DIAGNOSIS:  Left Temporal Arteritis   PROCEDURE:  Procedure(s): BIOPSY LEFT TEMPORAL ARTERY (Left)  SURGEON:  Surgeon(s) and Role:    * Sui W Thanh Mottern, MD - Primary  PHYSICIAN ASSISTANT:   ASSISTANTS: none   ANESTHESIA:   MAC  EBL:  Total I/O In: 1000 [I.V.:1000] Out: -   BLOOD ADMINISTERED:none  DRAINS: none   LOCAL MEDICATIONS USED:  LIDOCAINE   SPECIMEN:  Source of Specimen:  left temporal artery  DISPOSITION OF SPECIMEN:  PATHOLOGY  COUNTS:  YES  TOURNIQUET:  * No tourniquets in log *  DICTATION: .Other Dictation: Dictation Number [id number]  PLAN OF CARE: Discharge to home after PACU  PATIENT DISPOSITION:  PACU - hemodynamically stable.   Delay start of Pharmacological VTE agent (>24hrs) due to surgical blood loss or risk of bleeding: not applicable

## 2013-01-03 NOTE — Anesthesia Preprocedure Evaluation (Addendum)
Anesthesia Evaluation  Patient identified by MRN, date of birth, ID band Patient awake    Reviewed: Allergy & Precautions, H&P , NPO status , Patient's Chart, lab work & pertinent test results, reviewed documented beta blocker date and time   Airway Mallampati: III TM Distance: >3 FB Neck ROM: Full    Dental no notable dental hx. (+) Teeth Intact and Dental Advisory Given   Pulmonary COPD COPD inhaler,  breath sounds clear to auscultation  Pulmonary exam normal       Cardiovascular + dysrhythmias + pacemaker Rhythm:Regular Rate:Normal     Neuro/Psych  Headaches,  Neuromuscular disease negative psych ROS   GI/Hepatic Neg liver ROS, hiatal hernia,   Endo/Other  negative endocrine ROS  Renal/GU negative Renal ROS  negative genitourinary   Musculoskeletal   Abdominal   Peds  Hematology negative hematology ROS (+)   Anesthesia Other Findings   Reproductive/Obstetrics negative OB ROS                          Anesthesia Physical Anesthesia Plan  ASA: III  Anesthesia Plan: MAC   Post-op Pain Management:    Induction: Intravenous  Airway Management Planned: Simple Face Mask  Additional Equipment:   Intra-op Plan:   Post-operative Plan:   Informed Consent: I have reviewed the patients History and Physical, chart, labs and discussed the procedure including the risks, benefits and alternatives for the proposed anesthesia with the patient or authorized representative who has indicated his/her understanding and acceptance.   Dental advisory given  Plan Discussed with: CRNA  Anesthesia Plan Comments:         Anesthesia Quick Evaluation

## 2013-01-03 NOTE — Anesthesia Postprocedure Evaluation (Signed)
  Anesthesia Post-op Note  Patient: Lauren Buchanan  Procedure(s) Performed: Procedure(s): BIOPSY LEFT TEMPORAL ARTERY (Left)  Patient Location: PACU  Anesthesia Type:MAC  Level of Consciousness: awake and alert   Airway and Oxygen Therapy: Patient Spontanous Breathing  Post-op Pain: none  Post-op Assessment: Post-op Vital signs reviewed, Patient's Cardiovascular Status Stable, Respiratory Function Stable, Patent Airway and No signs of Nausea or vomiting  Post-op Vital Signs: Reviewed and stable  Complications: No apparent anesthesia complications

## 2013-01-04 ENCOUNTER — Encounter (HOSPITAL_BASED_OUTPATIENT_CLINIC_OR_DEPARTMENT_OTHER): Payer: Self-pay | Admitting: Otolaryngology

## 2013-01-04 NOTE — Op Note (Signed)
[name], DITTO NO.:  [id number]  MEDICAL RECORD NO.:  [id number]  LOCATION:                                 FACILITY:  PHYSICIAN:  Newman Pies, MD            DATE OF BIRTH:  [birth year]  DATE OF PROCEDURE:  01/03/2013 DATE OF DISCHARGE:                              OPERATIVE REPORT   PREOPERATIVE DIAGNOSIS:  Temporal arteritis.  POSTOPERATIVE DIAGNOSIS:  Temporal arteritis.  PROCEDURE PERFORMED:  Left temporal artery biopsy.  ANESTHESIA:  MAC anesthesia.  COMPLICATIONS:  None.  ESTIMATED BLOOD LOSS:  Minimal.  INDICATION FOR THE PROCEDURE:  The patient is an 79 year old female with a history of bilateral temporal headache and blurry vision.  She was previously seen by Dr. Knox Royalty, and was treated with prednisone and antibiotics.  Her symptoms improved with the prednisone treatment.  As part of her evaluation, she was also noted to have to an increased sedimentation rate.  Her history was suggestive of temporal arteritis. As a result, the patient was referred for left temporal artery biopsy. The risks, benefits, alternatives, and details of the procedure were discussed with the patient.  Questions were invited and answered. Informed consent was obtained.  DESCRIPTION:  The patient was taken to the operating room and placed supine on the operating table.  IV sedation was administered by the anesthesiologist.  Lidocaine 1% with 1:100,000 epinephrine was infiltrated locally within the left temporal area.  With a Doppler device, the left temporal artery was identified.  Vertical incision was made at the left superior preauricular area.  The incision was carried down to the level of the temporalis artery.  However at that time, no blood flow was noted on the Doppler.  In order to ascertain the location of the temporal artery, a second incision was made anterior to the original incisions.  The terminal branch of the temporal artery was then identified  and traced back to the original temporal artery. The segment of the temporal artery was then resected.  The lumen of the artery was identified, confirming that this was indeed an artery.  The specimen was sent to the Pathology Department for permanent histologic identification.  The surgical sites were copiously irrigated.  The incisions were closed in layers with 4-0 Vicryl and Dermabond.  The care of the patient was turned over to the anesthesiologist.  The patient was awakened from anesthesia without difficulty.  She was transferred to the recovery room in good condition.  OPERATIVE FINDINGS:  Left temporal artery was identified and removed.  SPECIMEN:  Left temporal artery segment.  FOLLOWUP CARE:  The patient will be discharged home once she is fully awake.  She will be placed on Vicodin p.r.n. pain and Keflex p.o. t.i.d. for 5 days.  The patient will follow up in my office in 1 week.     Newman Pies, MD     ST/MEDQ  D:  01/03/2013  T:  01/03/2013  Job:  [id number]  cc:   Knox Royalty, MD

## 2013-01-10 ENCOUNTER — Ambulatory Visit: Payer: Medicare Other | Admitting: Family Medicine

## 2013-01-11 ENCOUNTER — Other Ambulatory Visit (INDEPENDENT_AMBULATORY_CARE_PROVIDER_SITE_OTHER): Payer: Medicare Other

## 2013-01-11 ENCOUNTER — Encounter: Payer: Self-pay | Admitting: Gastroenterology

## 2013-01-11 ENCOUNTER — Ambulatory Visit (INDEPENDENT_AMBULATORY_CARE_PROVIDER_SITE_OTHER): Payer: Medicare Other | Admitting: Gastroenterology

## 2013-01-11 VITALS — BP 100/60 | HR 83 | Ht 59.0 in | Wt 117.8 lb

## 2013-01-11 DIAGNOSIS — R749 Abnormal serum enzyme level, unspecified: Secondary | ICD-10-CM

## 2013-01-11 DIAGNOSIS — R748 Abnormal levels of other serum enzymes: Secondary | ICD-10-CM

## 2013-01-11 LAB — HEPATIC FUNCTION PANEL
ALT: 35 U/L (ref 0–35)
Alkaline Phosphatase: 109 U/L (ref 39–117)
Bilirubin, Direct: 0.1 mg/dL (ref 0.0–0.3)
Total Bilirubin: 0.8 mg/dL (ref 0.3–1.2)

## 2013-01-11 NOTE — Patient Instructions (Signed)
You have been scheduled for an abdominal ultrasound at [facility] (1st floor of hospital) on 01-12-2013 at 9 AM. Please arrive 15 minutes prior to your appointment for registration. Make certain not to have anything to eat or drink 6 hours prior to your appointment. Should you need to reschedule your appointment, please contact radiology at [phone]. This test typically takes about 30 minutes to perform.  Your physician has requested that you go to the basement for lab work before leaving today.  We will call you with your results.  Thank you for choosing Dr. Russella Dar and [facility]

## 2013-01-11 NOTE — Progress Notes (Signed)
History of Present Illness: This is an 79 year old female accompanied by her daughter. He was found to have a mildly elevated alkaline phosphatase at 162 recent liver panel. Reviewing her prior blood work an elevated alkaline phosphatase at 183 was noted 5 years ago however her alkaline phosphatase has been normal on 2 occasions in between. All other liver function tests within normal. Her albumin is 2.8. She relates no history of liver or bone disease. She has no gastrointestinal complaints. She was recently diagnosed with temporal arteritis and is starting prednisone. She previously underwent a cystoscopy 2006 showing a hiatal hernia and esophageal stricture. She previously underwent colonoscopy 2002 showing sigmoid colon diverticulosis. She has been treated for irritable bowel syndrome and GERD. Denies weight loss, abdominal pain, constipation, diarrhea, change in stool caliber, melena, hematochezia, nausea, vomiting, dysphagia, reflux symptoms, chest pain.  Review of Systems: Pertinent positive and negative review of systems were noted in the above HPI section. All other review of systems were otherwise negative.  Current Medications, Allergies, Past Medical History, Past Surgical History, Family History and Social History were reviewed in [employer] record.  Physical Exam: General: Well developed , well nourished, no acute distress Head: Normocephalic and atraumatic Eyes:  sclerae anicteric, EOMI Ears: Normal auditory acuity Mouth: No deformity or lesions Neck: Supple, no masses or thyromegaly Lungs: Clear throughout to auscultation Heart: Regular rate and rhythm; no murmurs, rubs or bruits Abdomen: Soft, non tender and non distended. No masses, hepatosplenomegaly or hernias noted. Normal Bowel sounds Rectal: Not done Musculoskeletal: Symmetrical with no gross deformities  Skin: No lesions on visible extremities Pulses:  Normal pulses noted Extremities: No clubbing,  cyanosis, edema or deformities noted Neurological: Alert oriented x 4, grossly nonfocal Cervical Nodes:  No significant cervical adenopathy Inguinal Nodes: No significant inguinal adenopathy Psychological:  Alert and cooperative. Normal mood and affect  Assessment and Recommendations:  1. Elevated alkaline phosphatase. Rule out hepatic versus bone etiology. Schedule abdominal ultrasound. Obtain repeat liver function tests and a 5 ' nucleotidase. Further plans pending these results.  2. Newly diagnosed temporal arteritis, beginning prednisone.

## 2013-01-12 ENCOUNTER — Encounter: Payer: Self-pay | Admitting: Gastroenterology

## 2013-01-12 ENCOUNTER — Ambulatory Visit (HOSPITAL_COMMUNITY)
Admission: RE | Admit: 2013-01-12 | Discharge: 2013-01-12 | Disposition: A | Discharge status: Home / Self Care | Payer: Medicare Other | Source: Ambulatory Visit | Attending: Gastroenterology | Admitting: Gastroenterology

## 2013-01-12 ENCOUNTER — Other Ambulatory Visit: Payer: Self-pay

## 2013-01-12 DIAGNOSIS — K449 Diaphragmatic hernia without obstruction or gangrene: Secondary | ICD-10-CM | POA: Insufficient documentation

## 2013-01-12 DIAGNOSIS — R7989 Other specified abnormal findings of blood chemistry: Secondary | ICD-10-CM | POA: Insufficient documentation

## 2013-01-12 DIAGNOSIS — R748 Abnormal levels of other serum enzymes: Secondary | ICD-10-CM | POA: Insufficient documentation

## 2013-01-12 DIAGNOSIS — R933 Abnormal findings on diagnostic imaging of other parts of digestive tract: Secondary | ICD-10-CM

## 2013-01-12 DIAGNOSIS — R749 Abnormal serum enzyme level, unspecified: Secondary | ICD-10-CM

## 2013-01-12 DIAGNOSIS — J449 Chronic obstructive pulmonary disease, unspecified: Secondary | ICD-10-CM | POA: Insufficient documentation

## 2013-01-12 DIAGNOSIS — Z79899 Other long term (current) drug therapy: Secondary | ICD-10-CM | POA: Insufficient documentation

## 2013-01-12 DIAGNOSIS — M316 Other giant cell arteritis: Secondary | ICD-10-CM | POA: Insufficient documentation

## 2013-01-12 DIAGNOSIS — Z9089 Acquired absence of other organs: Secondary | ICD-10-CM | POA: Insufficient documentation

## 2013-01-12 DIAGNOSIS — Z950 Presence of cardiac pacemaker: Secondary | ICD-10-CM | POA: Insufficient documentation

## 2013-01-12 DIAGNOSIS — J4489 Other specified chronic obstructive pulmonary disease: Secondary | ICD-10-CM | POA: Insufficient documentation

## 2013-01-12 NOTE — Progress Notes (Signed)
  You have been scheduled for a CT scan of the abdomen and pelvis at [facility] ([address]---this is in the same building as Architectural technologist).   You are scheduled on 01/24/13 at 11 am . You should arrive 15 minutes prior to your appointment time for registration. Please follow the written instructions below on the day of your exam:  WARNING: IF YOU ARE ALLERGIC TO IODINE/X-RAY DYE, PLEASE NOTIFY RADIOLOGY IMMEDIATELY AT [phone]! YOU WILL BE GIVEN A 13 HOUR PREMEDICATION PREP.  1) Do not eat or drink anything after 7 am (4 hours prior to your test) 2) You have been given 2 bottles of oral contrast to drink. The solution may taste better if refrigerated, but do NOT add ice or any other liquid to this solution. Shake well before drinking.    Drink 1 bottle of contrast @ 9 am  (2 hours prior to your exam)  Drink 1 bottle of contrast @ 10 am  (1 hour prior to your exam)  You may take any medications as prescribed with a small amount of water except for the following: Metformin, Glucophage, Glucovance, Avandamet, Riomet, Fortamet, Actoplus Met, Janumet, Glumetza or Metaglip. The above medications must be held the day of the exam AND 48 hours after the exam.  The purpose of you drinking the oral contrast is to aid in the visualization of your intestinal tract. The contrast solution may cause some diarrhea. Before your exam is started, you will be given a small amount of fluid to drink. Depending on your individual set of symptoms, you may also receive an intravenous injection of x-ray contrast/dye. Plan on being at [facility] for 30 minutes or long, depending on the type of exam you are having performed.  If you have any questions regarding your exam or if you need to reschedule, you may call the CT department at [phone] between the hours of 8:00 am and 5:00 pm, Monday-Friday.  ________________________________________________________________________ Pt has been  notified and instructed contrast left at the front desk for pt pick up

## 2013-01-13 ENCOUNTER — Ambulatory Visit (INDEPENDENT_AMBULATORY_CARE_PROVIDER_SITE_OTHER): Payer: Medicare Other | Admitting: Pulmonary Disease

## 2013-01-13 ENCOUNTER — Encounter: Payer: Self-pay | Admitting: Pulmonary Disease

## 2013-01-13 VITALS — BP 100/60 | HR 70 | Temp 97.9°F | Ht 59.0 in | Wt 121.2 lb

## 2013-01-13 DIAGNOSIS — J449 Chronic obstructive pulmonary disease, unspecified: Secondary | ICD-10-CM

## 2013-01-13 DIAGNOSIS — J479 Bronchiectasis, uncomplicated: Secondary | ICD-10-CM

## 2013-01-13 DIAGNOSIS — J4489 Other specified chronic obstructive pulmonary disease: Secondary | ICD-10-CM

## 2013-01-13 NOTE — Assessment & Plan Note (Signed)
The patient has a history of mild airflow obstruction that is related to COPD from bronchiectasis.  She is doing well with her dulera, but if she is going to stay on high dose steroids for prolonged periods of time, we can probably downgrade this to arcapta alone.  I have asked her to work aggressively on some type of exercise program to stay functional.

## 2013-01-13 NOTE — Progress Notes (Signed)
  Subjective:    Patient ID: Lauren Buchanan, female    DOB: [birth year], 79 y.o.   MRN: [id number]  HPI Patient comes in today for followup of her known bronchiectasis with associated COPD.  She's been doing very well on dulera alone, and has not had a flareup of her breathing or any chest infection.  She feels the dulera has helped her.  She was recently diagnosed with temporal arteritis, and is now on high-dose prednisone temporarily.  She denies any significant cough, congestion, purulent mucus.  She rarely has to use her rescue inhaler.   Review of Systems  Constitutional: Negative for fever and unexpected weight change.  HENT: Positive for postnasal drip. Negative for ear pain, nosebleeds, congestion, sore throat, rhinorrhea, sneezing, trouble swallowing, dental problem and sinus pressure.   Eyes: Negative for redness and itching.  Respiratory: Positive for cough, shortness of breath and wheezing. Negative for chest tightness.   Cardiovascular: Negative for palpitations and leg swelling.  Gastrointestinal: Negative for nausea and vomiting.  Genitourinary: Negative for dysuria.  Musculoskeletal: Negative for joint swelling.  Skin: Negative for rash.  Neurological: Negative for headaches.  Hematological: Does not bruise/bleed easily.  Psychiatric/Behavioral: Negative for dysphoric mood. The patient is not nervous/anxious.        Objective:   Physical Exam Thin female in no acute distress Nose without purulence or discharge noted Oropharynx clear Neck without lymphadenopathy or thyromegaly Chest reveals one isolated crackle in the right midlung zone, otherwise totally clear Cardiac exam with regular rate and rhythm, 2/6 systolic murmur Lower extremities without edema, no cyanosis Alert and oriented, moves all 4 extremities.       Assessment & Plan:

## 2013-01-13 NOTE — Patient Instructions (Addendum)
Will have the nurse show you how to use the spacer. If you are going to stay on prednisone greater than 10mg  a day, we can probably change your inhaler to something that includes just one medication rather than two.  Let's see where you end up once tapered. Can try chlorpheniramine 4mg  otc, and take 2 at bedtime and one at lunch if needed.  followup with me in 6mos if doing well.

## 2013-01-13 NOTE — Assessment & Plan Note (Signed)
The patient has not had any recent chest infection, nor does she have a significant cough or mucus production.

## 2013-01-24 ENCOUNTER — Ambulatory Visit (INDEPENDENT_AMBULATORY_CARE_PROVIDER_SITE_OTHER)
Admission: RE | Admit: 2013-01-24 | Discharge: 2013-01-24 | Disposition: A | Discharge status: Home / Self Care | Payer: Medicare Other | Source: Ambulatory Visit | Attending: Gastroenterology | Admitting: Gastroenterology

## 2013-01-24 DIAGNOSIS — R933 Abnormal findings on diagnostic imaging of other parts of digestive tract: Secondary | ICD-10-CM

## 2013-01-24 MED ORDER — IOHEXOL 350 MG/ML SOLN
100.0000 mL | Freq: Once | INTRAVENOUS | Status: AC | PRN
  Administered 2013-01-24: 100 mL via INTRAVENOUS

## 2013-02-07 ENCOUNTER — Encounter: Payer: Self-pay | Admitting: Family Medicine

## 2013-02-07 ENCOUNTER — Ambulatory Visit (INDEPENDENT_AMBULATORY_CARE_PROVIDER_SITE_OTHER): Payer: Medicare Other | Admitting: Family Medicine

## 2013-02-07 VITALS — BP 140/90 | HR 92 | Temp 98.4°F | Wt 124.0 lb

## 2013-02-07 DIAGNOSIS — I471 Supraventricular tachycardia, unspecified: Secondary | ICD-10-CM

## 2013-02-07 NOTE — Patient Instructions (Signed)
Continue current medications  Check your blood pressure daily in the morning  Systolic blood pressure should be between 1:30 and 150  Diastolic blood pressure 80-95

## 2013-02-07 NOTE — Progress Notes (Signed)
  Subjective:    Patient ID: Lauren Buchanan, female    DOB: [birth year], 79 y.o.   MRN: [id number]  HPI Lauren Buchanan is an 79 year old female who comes in today accompanied by her husband for evaluation of multiple issues  This winter she was diagnosed to have temporal arteritis. She began having severe focal pain blurred vision. Biopsy was positive. She went to see Dr. Dareen Piano rheumatologist. She is on 50 mg of prednisone daily. No side effects  She also takes Toprol 50 mg daily for blood pressure control and heart rate control.  Present medications reviewed in the been unchanged. She has her pacemaker checked frequently   Review of Systems    review of systems negative Objective:   Physical Exam Well-developed well-nourished white female no acute distress BP right arm sitting position 140/90       Assessment & Plan:  SVT with history of mild hypertension under good control with Toprol 50 mg daily continue above dose

## 2013-02-20 ENCOUNTER — Other Ambulatory Visit: Payer: Self-pay | Admitting: Family Medicine

## 2013-02-20 ENCOUNTER — Ambulatory Visit (INDEPENDENT_AMBULATORY_CARE_PROVIDER_SITE_OTHER)
Admission: RE | Admit: 2013-02-20 | Discharge: 2013-02-20 | Disposition: A | Discharge status: Home / Self Care | Payer: Medicare Other | Source: Ambulatory Visit | Attending: Family Medicine | Admitting: Family Medicine

## 2013-02-20 ENCOUNTER — Encounter: Payer: Self-pay | Admitting: Family Medicine

## 2013-02-20 DIAGNOSIS — R918 Other nonspecific abnormal finding of lung field: Secondary | ICD-10-CM

## 2013-02-20 DIAGNOSIS — R9389 Abnormal findings on diagnostic imaging of other specified body structures: Secondary | ICD-10-CM

## 2013-02-27 ENCOUNTER — Emergency Department (HOSPITAL_COMMUNITY): Payer: Medicare Other

## 2013-02-27 ENCOUNTER — Encounter (HOSPITAL_COMMUNITY): Payer: Self-pay | Admitting: *Deleted

## 2013-02-27 ENCOUNTER — Inpatient Hospital Stay (HOSPITAL_COMMUNITY)
Admission: EM | Admit: 2013-02-27 | Discharge: 2013-03-03 | DRG: 871 | Disposition: A | Discharge status: Home / Self Care | Payer: Medicare Other | Attending: Internal Medicine | Admitting: Internal Medicine

## 2013-02-27 DIAGNOSIS — M316 Other giant cell arteritis: Secondary | ICD-10-CM

## 2013-02-27 DIAGNOSIS — E860 Dehydration: Secondary | ICD-10-CM

## 2013-02-27 DIAGNOSIS — R7401 Elevation of levels of liver transaminase levels: Secondary | ICD-10-CM

## 2013-02-27 DIAGNOSIS — K222 Esophageal obstruction: Secondary | ICD-10-CM | POA: Diagnosis present

## 2013-02-27 DIAGNOSIS — J471 Bronchiectasis with (acute) exacerbation: Secondary | ICD-10-CM

## 2013-02-27 DIAGNOSIS — R413 Other amnesia: Secondary | ICD-10-CM

## 2013-02-27 DIAGNOSIS — R5381 Other malaise: Secondary | ICD-10-CM

## 2013-02-27 DIAGNOSIS — K449 Diaphragmatic hernia without obstruction or gangrene: Secondary | ICD-10-CM | POA: Diagnosis present

## 2013-02-27 DIAGNOSIS — R5383 Other fatigue: Secondary | ICD-10-CM

## 2013-02-27 DIAGNOSIS — M1990 Unspecified osteoarthritis, unspecified site: Secondary | ICD-10-CM

## 2013-02-27 DIAGNOSIS — R102 Pelvic and perineal pain: Secondary | ICD-10-CM | POA: Diagnosis present

## 2013-02-27 DIAGNOSIS — J189 Pneumonia, unspecified organism: Secondary | ICD-10-CM | POA: Diagnosis present

## 2013-02-27 DIAGNOSIS — J4489 Other specified chronic obstructive pulmonary disease: Secondary | ICD-10-CM

## 2013-02-27 DIAGNOSIS — Z950 Presence of cardiac pacemaker: Secondary | ICD-10-CM

## 2013-02-27 DIAGNOSIS — K589 Irritable bowel syndrome without diarrhea: Secondary | ICD-10-CM

## 2013-02-27 DIAGNOSIS — R109 Unspecified abdominal pain: Secondary | ICD-10-CM

## 2013-02-27 DIAGNOSIS — Z79899 Other long term (current) drug therapy: Secondary | ICD-10-CM

## 2013-02-27 DIAGNOSIS — J3089 Other allergic rhinitis: Secondary | ICD-10-CM

## 2013-02-27 DIAGNOSIS — D72829 Elevated white blood cell count, unspecified: Secondary | ICD-10-CM

## 2013-02-27 DIAGNOSIS — I471 Supraventricular tachycardia, unspecified: Secondary | ICD-10-CM

## 2013-02-27 DIAGNOSIS — I442 Atrioventricular block, complete: Secondary | ICD-10-CM

## 2013-02-27 DIAGNOSIS — R197 Diarrhea, unspecified: Secondary | ICD-10-CM | POA: Diagnosis present

## 2013-02-27 DIAGNOSIS — R110 Nausea: Secondary | ICD-10-CM

## 2013-02-27 DIAGNOSIS — Z7982 Long term (current) use of aspirin: Secondary | ICD-10-CM

## 2013-02-27 DIAGNOSIS — E871 Hypo-osmolality and hyponatremia: Secondary | ICD-10-CM | POA: Diagnosis present

## 2013-02-27 DIAGNOSIS — IMO0002 Reserved for concepts with insufficient information to code with codable children: Secondary | ICD-10-CM

## 2013-02-27 DIAGNOSIS — J479 Bronchiectasis, uncomplicated: Secondary | ICD-10-CM

## 2013-02-27 DIAGNOSIS — R6510 Systemic inflammatory response syndrome (SIRS) of non-infectious origin without acute organ dysfunction: Secondary | ICD-10-CM | POA: Diagnosis present

## 2013-02-27 DIAGNOSIS — E049 Nontoxic goiter, unspecified: Secondary | ICD-10-CM

## 2013-02-27 DIAGNOSIS — K838 Other specified diseases of biliary tract: Secondary | ICD-10-CM

## 2013-02-27 DIAGNOSIS — K5730 Diverticulosis of large intestine without perforation or abscess without bleeding: Secondary | ICD-10-CM | POA: Diagnosis present

## 2013-02-27 DIAGNOSIS — J449 Chronic obstructive pulmonary disease, unspecified: Secondary | ICD-10-CM | POA: Diagnosis present

## 2013-02-27 DIAGNOSIS — M722 Plantar fascial fibromatosis: Secondary | ICD-10-CM

## 2013-02-27 DIAGNOSIS — R8290 Unspecified abnormal findings in urine: Secondary | ICD-10-CM

## 2013-02-27 DIAGNOSIS — A419 Sepsis, unspecified organism: Principal | ICD-10-CM

## 2013-02-27 DIAGNOSIS — H538 Other visual disturbances: Secondary | ICD-10-CM

## 2013-02-27 HISTORY — DX: Unspecified asthma, uncomplicated: J45.909

## 2013-02-27 LAB — POCT I-STAT, CHEM 8
BUN: 25 mg/dL — ABNORMAL HIGH (ref 6–23)
Calcium, Ion: 1.01 mmol/L — ABNORMAL LOW (ref 1.13–1.30)
Chloride: 101 mEq/L (ref 96–112)
Creatinine, Ser: 0.7 mg/dL (ref 0.50–1.10)
Glucose, Bld: 110 mg/dL — ABNORMAL HIGH (ref 70–99)
HCT: 42 % (ref 36.0–46.0)
Hemoglobin: 14.3 g/dL (ref 12.0–15.0)
Potassium: 4.3 mEq/L (ref 3.5–5.1)
Sodium: 134 mEq/L — ABNORMAL LOW (ref 135–145)
TCO2: 26 mmol/L (ref 0–100)

## 2013-02-27 LAB — COMPREHENSIVE METABOLIC PANEL
ALT: 53 U/L — ABNORMAL HIGH (ref 0–35)
AST: 46 U/L — ABNORMAL HIGH (ref 0–37)
Albumin: 2.9 g/dL — ABNORMAL LOW (ref 3.5–5.2)
Alkaline Phosphatase: 88 U/L (ref 39–117)
BUN: 25 mg/dL — ABNORMAL HIGH (ref 6–23)
CO2: 25 mEq/L (ref 19–32)
CO2: 26 mEq/L (ref 19–32)
Calcium: 8.1 mg/dL — ABNORMAL LOW (ref 8.4–10.5)
Chloride: 100 mEq/L (ref 96–112)
Chloride: 99 mEq/L (ref 96–112)
Creatinine, Ser: 0.68 mg/dL (ref 0.50–1.10)
GFR calc Af Amer: 89 mL/min — ABNORMAL LOW (ref >90)
GFR calc Af Amer: 90 mL/min — ABNORMAL LOW (ref >90)
GFR calc non Af Amer: 76 mL/min — ABNORMAL LOW (ref >90)
GFR calc non Af Amer: 78 mL/min — ABNORMAL LOW (ref >90)
Glucose, Bld: 104 mg/dL — ABNORMAL HIGH (ref 70–99)
Glucose, Bld: 109 mg/dL — ABNORMAL HIGH (ref 70–99)
Potassium: 4.4 mEq/L (ref 3.5–5.1)
Sodium: 134 mEq/L — ABNORMAL LOW (ref 135–145)
Sodium: 133 mEq/L — ABNORMAL LOW (ref 135–145)
Total Bilirubin: 1.6 mg/dL — ABNORMAL HIGH (ref 0.3–1.2)
Total Protein: 5.8 g/dL — ABNORMAL LOW (ref 6.0–8.3)
Total Protein: 5.7 g/dL — ABNORMAL LOW (ref 6.0–8.3)

## 2013-02-27 LAB — URINE MICROSCOPIC-ADD ON

## 2013-02-27 LAB — CBC WITH DIFFERENTIAL/PLATELET
Basophils Absolute: 0.1 10*3/uL (ref 0.0–0.1)
Eosinophils Absolute: 0.1 10*3/uL (ref 0.0–0.7)
HCT: 39.8 % (ref 36.0–46.0)
Hemoglobin: 13.4 g/dL (ref 12.0–15.0)
Lymphs Abs: 2.8 10*3/uL (ref 0.7–4.0)
Monocytes Absolute: 2.2 10*3/uL — ABNORMAL HIGH (ref 0.1–1.0)
Neutrophils Relative %: 84 % — ABNORMAL HIGH (ref 43–77)
WBC: 31 10*3/uL — ABNORMAL HIGH (ref 4.0–10.5)

## 2013-02-27 LAB — URINALYSIS, ROUTINE W REFLEX MICROSCOPIC
Glucose, UA: NEGATIVE mg/dL
Hgb urine dipstick: NEGATIVE
pH: 6 (ref 5.0–8.0)

## 2013-02-27 LAB — POCT I-STAT 3, VENOUS BLOOD GAS (G3P V)
pCO2, Ven: 40.2 mmHg — ABNORMAL LOW (ref 45.0–50.0)
pO2, Ven: 37 mmHg (ref 30.0–45.0)

## 2013-02-27 LAB — CG4 I-STAT (LACTIC ACID): Lactic Acid, Venous: 2.19 mmol/L (ref 0.5–2.2)

## 2013-02-27 MED ORDER — METRONIDAZOLE IN NACL 5-0.79 MG/ML-% IV SOLN
500.0000 mg | Freq: Once | INTRAVENOUS | Status: AC
  Administered 2013-02-28: 500 mg via INTRAVENOUS
  Filled 2013-02-27: qty 100

## 2013-02-27 MED ORDER — VANCOMYCIN HCL IN DEXTROSE 1-5 GM/200ML-% IV SOLN
1000.0000 mg | Freq: Once | INTRAVENOUS | Status: AC
  Administered 2013-02-27: 1000 mg via INTRAVENOUS
  Filled 2013-02-27: qty 200

## 2013-02-27 MED ORDER — ACETAMINOPHEN 650 MG RE SUPP: 650.0000 mg | Freq: Four times a day (QID) | RECTAL | Status: DC | PRN

## 2013-02-27 MED ORDER — SODIUM CHLORIDE 0.9 % IV SOLN
INTRAVENOUS | Status: AC
  Administered 2013-02-27 – 2013-02-28 (×2): via INTRAVENOUS

## 2013-02-27 MED ORDER — ENOXAPARIN SODIUM 40 MG/0.4ML ~~LOC~~ SOLN
40.0000 mg | SUBCUTANEOUS | Status: DC
  Administered 2013-02-28 – 2013-03-02 (×4): 40 mg via SUBCUTANEOUS
  Filled 2013-02-27 (×6): qty 0.4

## 2013-02-27 MED ORDER — IOHEXOL 300 MG/ML  SOLN
100.0000 mL | Freq: Once | INTRAMUSCULAR | Status: AC | PRN
  Administered 2013-02-27: 100 mL via INTRAVENOUS

## 2013-02-27 MED ORDER — ONDANSETRON HCL 4 MG/2ML IJ SOLN
4.0000 mg | Freq: Once | INTRAMUSCULAR | Status: AC
  Administered 2013-02-27: 4 mg via INTRAVENOUS
  Filled 2013-02-27: qty 2

## 2013-02-27 MED ORDER — HYDROCODONE-ACETAMINOPHEN 5-325 MG PO TABS
1.0000 | ORAL_TABLET | ORAL | Status: DC | PRN
  Administered 2013-02-27 – 2013-03-02 (×4): 1 via ORAL
  Filled 2013-02-27 (×4): qty 1

## 2013-02-27 MED ORDER — OMEPRAZOLE MAGNESIUM 20 MG PO TBEC: 20.0000 mg | DELAYED_RELEASE_TABLET | Freq: Every day | ORAL | Status: DC

## 2013-02-27 MED ORDER — DEXTROSE 5 % IV SOLN
1.0000 g | Freq: Once | INTRAVENOUS | Status: AC
  Administered 2013-02-27: 1 g via INTRAVENOUS
  Filled 2013-02-27: qty 10

## 2013-02-27 MED ORDER — SODIUM CHLORIDE 0.9 % IV SOLN
Freq: Once | INTRAVENOUS | Status: AC
  Administered 2013-02-27: 15:00:00 via INTRAVENOUS

## 2013-02-27 MED ORDER — HYDROCORTISONE SOD SUCCINATE 100 MG IJ SOLR
100.0000 mg | Freq: Once | INTRAMUSCULAR | Status: AC
  Administered 2013-02-27: 100 mg via INTRAVENOUS
  Filled 2013-02-27: qty 2

## 2013-02-27 MED ORDER — ACETAMINOPHEN 325 MG PO TABS: 650.0000 mg | ORAL_TABLET | Freq: Four times a day (QID) | ORAL | Status: DC | PRN

## 2013-02-27 MED ORDER — MORPHINE SULFATE 2 MG/ML IJ SOLN
2.0000 mg | Freq: Once | INTRAMUSCULAR | Status: AC
  Administered 2013-02-27: 2 mg via INTRAVENOUS
  Filled 2013-02-27: qty 1

## 2013-02-27 MED ORDER — ASPIRIN EC 81 MG PO TBEC
81.0000 mg | DELAYED_RELEASE_TABLET | Freq: Every day | ORAL | Status: DC
  Administered 2013-02-28 – 2013-03-03 (×4): 81 mg via ORAL
  Filled 2013-02-27 (×4): qty 1

## 2013-02-27 MED ORDER — IOHEXOL 300 MG/ML  SOLN
25.0000 mL | INTRAMUSCULAR | Status: AC
  Administered 2013-02-27 (×2): 25 mL via ORAL

## 2013-02-27 MED ORDER — SODIUM CHLORIDE 0.9 % IV BOLUS (SEPSIS): 1000.0000 mL | Freq: Once | INTRAVENOUS | Status: DC

## 2013-02-27 MED ORDER — ONDANSETRON HCL 4 MG/2ML IJ SOLN
4.0000 mg | Freq: Four times a day (QID) | INTRAMUSCULAR | Status: DC | PRN
  Administered 2013-02-27: 4 mg via INTRAVENOUS

## 2013-02-27 MED ORDER — LEVOFLOXACIN IN D5W 750 MG/150ML IV SOLN
750.0000 mg | INTRAVENOUS | Status: DC
  Administered 2013-02-28 – 2013-03-01 (×2): 750 mg via INTRAVENOUS
  Filled 2013-02-27 (×2): qty 150

## 2013-02-27 MED ORDER — PANTOPRAZOLE SODIUM 40 MG PO TBEC
40.0000 mg | DELAYED_RELEASE_TABLET | Freq: Every day | ORAL | Status: DC
  Administered 2013-02-28 – 2013-03-02 (×3): 40 mg via ORAL
  Filled 2013-02-27 (×4): qty 1

## 2013-02-27 MED ORDER — ONDANSETRON HCL 4 MG PO TABS: 4.0000 mg | ORAL_TABLET | Freq: Four times a day (QID) | ORAL | Status: DC | PRN

## 2013-02-27 MED ORDER — ALBUTEROL SULFATE HFA 108 (90 BASE) MCG/ACT IN AERS
2.0000 | INHALATION_SPRAY | Freq: Four times a day (QID) | RESPIRATORY_TRACT | Status: DC | PRN
  Filled 2013-02-27: qty 6.7

## 2013-02-27 MED ORDER — ONDANSETRON HCL 4 MG/2ML IJ SOLN
INTRAMUSCULAR | Status: AC
  Filled 2013-02-27: qty 2

## 2013-02-27 MED ORDER — DEXTROSE 5 % IV SOLN
1.0000 g | INTRAVENOUS | Status: DC
  Administered 2013-02-27 – 2013-03-01 (×3): 1 g via INTRAVENOUS
  Filled 2013-02-27 (×4): qty 1

## 2013-02-27 MED ORDER — MOMETASONE FURO-FORMOTEROL FUM 100-5 MCG/ACT IN AERO
2.0000 | INHALATION_SPRAY | Freq: Two times a day (BID) | RESPIRATORY_TRACT | Status: DC
  Administered 2013-02-28 – 2013-03-03 (×8): 2 via RESPIRATORY_TRACT
  Filled 2013-02-27: qty 8.8

## 2013-02-27 MED ORDER — VANCOMYCIN HCL IN DEXTROSE 750-5 MG/150ML-% IV SOLN
750.0000 mg | INTRAVENOUS | Status: DC
  Administered 2013-02-28 – 2013-03-02 (×3): 750 mg via INTRAVENOUS
  Filled 2013-02-27 (×4): qty 150

## 2013-02-27 MED ORDER — SODIUM CHLORIDE 0.9 % IJ SOLN
3.0000 mL | Freq: Two times a day (BID) | INTRAMUSCULAR | Status: DC
  Administered 2013-02-28 – 2013-03-02 (×2): 3 mL via INTRAVENOUS

## 2013-02-27 MED ORDER — PREDNISONE 50 MG PO TABS
50.0000 mg | ORAL_TABLET | Freq: Every day | ORAL | Status: DC
  Filled 2013-02-27: qty 1

## 2013-02-27 MED ORDER — SODIUM CHLORIDE 0.9 % IV BOLUS (SEPSIS)
1000.0000 mL | Freq: Once | INTRAVENOUS | Status: AC
  Administered 2013-02-27: 1000 mL via INTRAVENOUS

## 2013-02-27 NOTE — H&P (Signed)
[facility] History and Physical  Lauren Buchanan:[id number] DOB: [birth year] DOA: 02/27/2013  Referring physician: ER physician. PCP: Evette Georges, MD  Specialists: Dr. Dareen Piano rheumatologist.  Chief Complaint: Abdominal pain.  HPI: Lauren Buchanan is a 79 y.o. female who was restaged diagnosed with temporal arteritis and is on steroids presents with complaints of abdominal pain. Patient's symptoms started since yesterday. Pain is mostly suprapubic associated with at least 3-4 episodes of diarrhea. Denies any nausea vomiting. Patient has been having productive cough since morning which is having greenish sputum. Denies any fever chills. Chest x-ray shows multifocal pneumonia. CT abdomen and pelvis shows intrahepatic and extrahepatic biliary ductal the location. Patient has been admitted for further management.    Review of Systems: As presented in the history of presenting illness, rest negative.  Past Medical History  Diagnosis Date  . DJD (degenerative joint disease)   . IBS (irritable bowel syndrome)   . Goiter   . Complete heart block 1999    s/p PPM by Dr Juanda Chance, most recent generator change 2009  . Renal cyst   . COPD (chronic obstructive pulmonary disease)   . Headache   . Pacemaker   . Wears glasses   . Hiatal hernia   . Esophageal stricture   . Diverticulosis   . Asthma    Past Surgical History  Procedure Laterality Date  . Pacemaker insertion  1999    Gen change (SJM) by Dr Juanda Chance 2009  . Cholecystectomy    . Left lower lobectomy for brochiectasis  1950  . Hemorrhoidectomy    . Renal cyst excision    . Tonsillectomy    . Fracture surgery      fx lt wrist  . Colonoscopy    . Artery biopsy Left 01/03/2013    Procedure: BIOPSY LEFT TEMPORAL ARTERY;  Surgeon: Darletta Moll, MD;  Location: [facility];  Service: ENT;  Laterality: Left;   Social History:  reports that she has never smoked. She does not have any smokeless tobacco history  on file. She reports that she does not drink alcohol or use illicit drugs.  lives at home.  where does patient live--  can do ADLs. Can patient participate in ADLs?  Allergies  Allergen Reactions  . Amoxicillin-Pot Clavulanate Other (See Comments)    Unknown   . Loratadine Other (See Comments)    Unknown     Family History  Problem Relation Age of Onset  . Heart disease Mother   . Breast cancer Mother   . Stomach cancer Father       Prior to Admission medications   Medication Sig Start Date End Date Taking? Authorizing Provider  albuterol (PROVENTIL HFA;VENTOLIN HFA) 108 (90 BASE) MCG/ACT inhaler Inhale 2 puffs into the lungs every 6 (six) hours as needed for shortness of breath.   Yes Historical Provider, MD  alendronate (FOSAMAX) 70 MG tablet Take 70 mg by mouth every Thursday. Take with a full glass of water on an empty stomach.   Yes Historical Provider, MD  aspirin EC 81 MG tablet Take 81 mg by mouth daily.   Yes Historical Provider, MD  Calcium Citrate-Vitamin D (CITRACAL PETITES/VITAMIN D) 200-250 MG-UNIT TABS Take 1 tablet by mouth daily.     Yes Historical Provider, MD  GLUCOSAMINE PO Take 1 tablet by mouth daily.     Yes Historical Provider, MD  HYDROcodone-acetaminophen (NORCO/VICODIN) 5-325 MG per tablet Take 1 tablet by mouth every 4 (four) hours as needed for  pain. 01/03/13  Yes Darletta Moll, MD  metoprolol succinate (TOPROL-XL) 100 MG 24 hr tablet Take 50 mg by mouth daily. 09/01/11  Yes Roderick Pee, MD  omeprazole (PRILOSEC OTC) 20 MG tablet Take 20 mg by mouth daily.   Yes Historical Provider, MD  predniSONE (DELTASONE) 10 MG tablet Take 50 mg by mouth daily.   Yes Historical Provider, MD  vitamin B-12 (CYANOCOBALAMIN) 500 MCG tablet Take 500 mcg by mouth daily.     Yes Historical Provider, MD  vitamin C (ASCORBIC ACID) 500 MG tablet Take 500 mg by mouth daily.     Yes Historical Provider, MD  hyoscyamine (LEVSIN SL) 0.125 MG SL tablet Place 1 tablet (0.125 mg total)  under the tongue every 4 (four) hours as needed. 09/01/11   Roderick Pee, MD  mometasone-formoterol [facility]) 100-5 MCG/ACT AERO Inhale 2 puffs into the lungs daily as needed for shortness of breath.     Historical Provider, MD   Physical Exam: Filed Vitals:   02/27/13 1928 02/27/13 1930 02/27/13 1945 02/27/13 2045  BP: 102/41 101/45 100/50 120/63  Pulse: 80 80  99  Temp:      TempSrc:      Resp: 11 12 16 22   SpO2: 91% 91%  96%     General:  Well-developed well-nourished.   Eyes: Anicteric no pallor.   ENT: No discharge from the ears eyes nose or mouth.   Neck:  no mass felt.   Cardiovascular:  S1-S2 heard.  Respiratory:  No rhonchi or crepitations.  Abdomen: Soft nontender bowel sounds present.   Skin:  no rash.   Musculoskeletal:  no edema.  Psychiatric:  appears normal.  Neurologic:  alert awake oriented to time place and person. Moves all extremities.   Labs on Admission:  Basic Metabolic Panel:  Recent Labs Lab 02/27/13 1617 02/27/13 1752 02/27/13 1814  NA 134* 133* 134*  K HEMOLYZED SPECIMEN, RESULTS MAY BE AFFECTED 4.4 4.3  CL 100 99 101  CO2 25 26  --   GLUCOSE 104* 109* 110*  BUN 25* 25* 25*  CREATININE 0.71 0.68 0.70  CALCIUM 8.2* 8.1*  --    Liver Function Tests:  Recent Labs Lab 02/27/13 1617 02/27/13 1752  AST 60* 46*  ALT HEMOLYZED SPECIMEN, RESULTS MAY BE AFFECTED 53*  ALKPHOS HEMOLYZED SPECIMEN, RESULTS MAY BE AFFECTED 88  BILITOT 1.3* 1.6*  PROT 5.8* 5.7*  ALBUMIN 2.8* 2.9*   No results found for this basename: LIPASE, AMYLASE,  in the last 168 hours No results found for this basename: AMMONIA,  in the last 168 hours CBC:  Recent Labs Lab 02/27/13 1617 02/27/13 1814  WBC 31.0*  --   NEUTROABS 26.0*  --   HGB 13.4 14.3  HCT 39.8 42.0  MCV 86.5  --   PLT 158  --    Cardiac Enzymes: No results found for this basename: CKTOTAL, CKMB, CKMBINDEX, TROPONINI,  in the last 168 hours  BNP (last 3 results) No results found  for this basename: PROBNP,  in the last 8760 hours CBG: No results found for this basename: GLUCAP,  in the last 168 hours  Radiological Exams on Admission: Dg Chest 2 View  02/27/2013  *RADIOLOGY REPORT*  Clinical Data: Cough, abdominal pain.  CHEST - 2 VIEW  Comparison: 02/20/2013  Findings: Patchy bilateral perihilar and upper lobe airspace opacities concerning for pneumonia.  Hyperinflation of the lungs compatible with COPD.  Heart is normal size.  No effusions.  Left  pacer is unchanged.  IMPRESSION: New multifocal patchy bilateral airspace opacities concerning for multifocal pneumonia.   Original Report Authenticated By: Charlett Nose, M.D.    Ct Abdomen Pelvis W Contrast  02/27/2013  *RADIOLOGY REPORT*  Clinical Data: Nausea, diarrhea.  CT ABDOMEN AND PELVIS WITH CONTRAST  Technique:  Multidetector CT imaging of the abdomen and pelvis was performed following the standard protocol during bolus administration of intravenous contrast.  Contrast: OMNIPAQUE IOHEXOL 300 MG/ML  SOLN  Comparison: 01/24/2013  Findings: Area of airspace consolidation noted posteriorly in the right lower lobe.  This is concerning for pneumonia.  Scarring in the left lung base.  Small hiatal hernia.  No effusions.  Prior cholecystectomy.  Mildly dilated common bile duct which has increased since prior study measuring up to 12 mm.  Mild intrahepatic biliary ductal dilatation also appears slightly increased since prior study.  No focal hepatic abnormality.  Calcifications in the spleen compatible with old granulomatous disease.  Pancreas, adrenals are unremarkable.  Small hypodensities in the kidneys compatible with small cysts, unchanged.  No hydronephrosis.  There is sigmoid diverticulosis.  No active diverticulitis.  Small bowel is decompressed.  Appendix is visualized and is normal.  No free fluid, free air or adenopathy.  Urinary bladder, uterus and adnexa unremarkable.  Aorta is normal caliber.  No acute bony abnormality.   Degenerative changes in the lumbar spine.  IMPRESSION: Consolidation in the right lower lobe concerning for pneumonia. This is new since recent study.  Increasing intrahepatic and extrahepatic biliary ductal dilatation. While this may be related to prior cholecystectomy and patient's age, recommend correlation with LFTs to exclude biliary obstruction.  If further evaluation is felt warranted, MRCP may be beneficial.  Sigmoid diverticulosis.  Small hiatal hernia.   Original Report Authenticated By: Charlett Nose, M.D.     Assessment/Plan Principal Problem:   SIRS (systemic inflammatory response syndrome) Active Problems:   COPD   PACEMAKER, PERMANENT   Bronchiectasis without acute exacerbation   Abdominal pain   Healthcare-associated pneumonia   Temporal arteritis   1. SIRS from pneumonia  - patient has been placed on IV fluids and empiric antibiotics to cover for health care associated pneumonia. Patient has been given stress dose steroids. Follow blood cultures and urine cultures.  2. Abdominal pain with intrahepatic and extrahepatic biliary ductal dilation - consult GI in a.m.  3. History of bronchiectasis  - presently on antibiotics for #1.  4. History of temporal arteritis - on steroids. 5. History of AV block on pacemaker.    Code Status: full code.   Family Communication:  patient's son.   Disposition Plan:  admit to inpatient.     KAKRAKANDY,ARSHAD N. [facility] Pager [phone].  If 7PM-7AM, please contact night-coverage [email or web address] Password [name] 02/27/2013, 9:19 PM

## 2013-02-27 NOTE — ED Notes (Signed)
Critical i-STAT G3+ was shown to Dr. Rhunette Croft.

## 2013-02-27 NOTE — ED Provider Notes (Signed)
History     CSN: [id number]  Arrival date & time 02/27/13  1256   First MD Initiated Contact with Patient 02/27/13 1419      Chief Complaint  Patient presents with  . Diarrhea    (Consider location/radiation/quality/duration/timing/severity/associated sxs/prior treatment) HPI Point of diarrhea 5 or 6 episodes since last night accompanied by black stool. And low crampy abdominal pain and nausea. She denies nausea present presently discomfort is mild. No treatment prior to coming here nothing makes symptoms better or worse. No fever no urinary symptoms no other associated complaint. No chest pain Past Medical History  Diagnosis Date  . DJD (degenerative joint disease)   . IBS (irritable bowel syndrome)   . Goiter   . Complete heart block 1999    s/p PPM by Dr Juanda Chance, most recent generator change 2009  . Renal cyst   . COPD (chronic obstructive pulmonary disease)   . Headache   . Pacemaker   . Wears glasses   . Hiatal hernia   . Esophageal stricture   . Diverticulosis    giant cell arteritis  Past Surgical History  Procedure Laterality Date  . Pacemaker insertion  1999    Gen change (SJM) by Dr Juanda Chance 2009  . Cholecystectomy    . Left lower lobectomy for brochiectasis  1950  . Hemorrhoidectomy    . Renal cyst excision    . Tonsillectomy    . Fracture surgery      fx lt wrist  . Colonoscopy    . Artery biopsy Left 01/03/2013    Procedure: BIOPSY LEFT TEMPORAL ARTERY;  Surgeon: Darletta Moll, MD;  Location: [facility];  Service: ENT;  Laterality: Left;    Family History  Problem Relation Age of Onset  . Heart disease Mother   . Breast cancer Mother   . Stomach cancer Father     History  Substance Use Topics  . Smoking status: Never Smoker   . Smokeless tobacco: Not on file  . Alcohol Use: No    OB History   Grav Para Term Preterm Abortions TAB SAB Ect Mult Living                  Review of Systems  Constitutional: Negative.   HENT:  Negative.   Respiratory: Negative.   Cardiovascular: Negative.   Gastrointestinal: Positive for nausea, abdominal pain and diarrhea.  Musculoskeletal: Negative.   Skin: Negative.   Neurological: Negative.   Psychiatric/Behavioral: Negative.   All other systems reviewed and are negative.    Allergies  Amoxicillin-pot clavulanate and Loratadine  Home Medications   Current Outpatient Rx  Name  Route  Sig  Dispense  Refill  . albuterol (PROVENTIL HFA;VENTOLIN HFA) 108 (90 BASE) MCG/ACT inhaler   Inhalation   Inhale 2 puffs into the lungs every 6 (six) hours as needed for shortness of breath.         Marland Kitchen alendronate (FOSAMAX) 70 MG tablet   Oral   Take 70 mg by mouth every Thursday. Take with a full glass of water on an empty stomach.         Marland Kitchen aspirin EC 81 MG tablet   Oral   Take 81 mg by mouth daily.         . Calcium Citrate-Vitamin D (CITRACAL PETITES/VITAMIN D) 200-250 MG-UNIT TABS   Oral   Take 1 tablet by mouth daily.           Marland Kitchen GLUCOSAMINE PO  Oral   Take 1 tablet by mouth daily.           Marland Kitchen HYDROcodone-acetaminophen (NORCO/VICODIN) 5-325 MG per tablet   Oral   Take 1 tablet by mouth every 4 (four) hours as needed for pain.   20 tablet   0   . metoprolol succinate (TOPROL-XL) 100 MG 24 hr tablet   Oral   Take 50 mg by mouth daily.         Marland Kitchen omeprazole (PRILOSEC OTC) 20 MG tablet   Oral   Take 20 mg by mouth daily.         . predniSONE (DELTASONE) 10 MG tablet   Oral   Take 50 mg by mouth daily.         . vitamin B-12 (CYANOCOBALAMIN) 500 MCG tablet   Oral   Take 500 mcg by mouth daily.           . vitamin C (ASCORBIC ACID) 500 MG tablet   Oral   Take 500 mg by mouth daily.           . hyoscyamine (LEVSIN SL) 0.125 MG SL tablet   Sublingual   Place 1 tablet (0.125 mg total) under the tongue every 4 (four) hours as needed.   50 tablet   4   . mometasone-formoterol (DULERA) 100-5 MCG/ACT AERO   Inhalation   Inhale 2 puffs  into the lungs daily as needed for shortness of breath.            BP 97/47  Pulse 81  Temp(Src) 98.2 F (36.8 C) (Oral)  Resp 17  SpO2 96%  Physical Exam  Nursing note and vitals reviewed. Constitutional: She appears well-developed and well-nourished.  HENT:  Head: Normocephalic and atraumatic.  Eyes: Conjunctivae are normal. Pupils are equal, round, and reactive to light.  Neck: Neck supple. No tracheal deviation present. No thyromegaly present.  Cardiovascular: Normal rate and regular rhythm.   No murmur heard. Pulmonary/Chest: Effort normal and breath sounds normal.  Abdominal: Soft. Bowel sounds are normal. She exhibits no distension. There is tenderness.  Mild tenderness at lower quadrants  Genitourinary: Guaiac negative stool.  Rectal normal tone brown stool Hemoccult negative  Musculoskeletal: Normal range of motion. She exhibits no edema and no tenderness.  Neurological: She is alert. Coordination normal.  Skin: Skin is warm and dry. No rash noted.  Psychiatric: She has a normal mood and affect.    ED Course  Procedures (including critical care time)  Labs Reviewed  URINALYSIS, ROUTINE W REFLEX MICROSCOPIC  CBC WITH DIFFERENTIAL  COMPREHENSIVE METABOLIC PANEL   No results found.   No diagnosis found. 5:55 PM patient resting comfortably after treatment with intravenous fluids, morphine and Zofran Results for orders placed during the hospital encounter of 02/27/13  URINALYSIS, ROUTINE W REFLEX MICROSCOPIC      Result Value Range   Color, Urine AMBER (*) YELLOW   APPearance HAZY (*) CLEAR   Specific Gravity, Urine 1.022  1.005 - 1.030   pH 6.0  5.0 - 8.0   Glucose, UA NEGATIVE  NEGATIVE mg/dL   Hgb urine dipstick NEGATIVE  NEGATIVE   Bilirubin Urine SMALL (*) NEGATIVE   Ketones, ur 15 (*) NEGATIVE mg/dL   Protein, ur 30 (*) NEGATIVE mg/dL   Urobilinogen, UA 1.0  0.0 - 1.0 mg/dL   Nitrite NEGATIVE  NEGATIVE   Leukocytes, UA MODERATE (*) NEGATIVE  CBC  WITH DIFFERENTIAL      Result Value Range  WBC 31.0 (*) 4.0 - 10.5 K/uL   RBC 4.60  3.87 - 5.11 MIL/uL   Hemoglobin 13.4  12.0 - 15.0 g/dL   HCT 16.1  09.6 - 04.5 %   MCV 86.5  78.0 - 100.0 fL   MCH 29.1  26.0 - 34.0 pg   MCHC 33.7  30.0 - 36.0 g/dL   RDW 40.9 (*) 81.1 - 91.4 %   Platelets 158  150 - 400 K/uL   Neutro Abs 26.0 (*) 1.7 - 7.7 K/uL   Lymphs Abs 2.8  0.7 - 4.0 K/uL   Monocytes Absolute 2.2 (*) 0.1 - 1.0 K/uL   Eosinophils Absolute 0.1  0.0 - 0.7 K/uL   Basophils Absolute 0.1  0.0 - 0.1 K/uL   Neutrophils Relative 84 (*) 43 - 77 %   Lymphocytes Relative 9 (*) 12 - 46 %   Monocytes Relative 7  3 - 12 %   Eosinophils Relative 0  0 - 5 %   Basophils Relative 0  0 - 1 %   RBC Morphology TEARDROP CELLS     WBC Morphology INCREASED BANDS (>20% BANDS)    COMPREHENSIVE METABOLIC PANEL      Result Value Range   Sodium 134 (*) 135 - 145 mEq/L   Potassium HEMOLYZED SPECIMEN, RESULTS MAY BE AFFECTED  3.5 - 5.1 mEq/L   Chloride 100  96 - 112 mEq/L   CO2 25  19 - 32 mEq/L   Glucose, Bld 104 (*) 70 - 99 mg/dL   BUN 25 (*) 6 - 23 mg/dL   Creatinine, Ser 7.82  0.50 - 1.10 mg/dL   Calcium 8.2 (*) 8.4 - 10.5 mg/dL   Total Protein 5.8 (*) 6.0 - 8.3 g/dL   Albumin 2.8 (*) 3.5 - 5.2 g/dL   AST 60 (*) 0 - 37 U/L   ALT HEMOLYZED SPECIMEN, RESULTS MAY BE AFFECTED  0 - 35 U/L   Alkaline Phosphatase HEMOLYZED SPECIMEN, RESULTS MAY BE AFFECTED  39 - 117 U/L   Total Bilirubin 1.3 (*) 0.3 - 1.2 mg/dL   GFR calc non Af Amer 76 (*) >90 mL/min   GFR calc Af Amer 89 (*) >90 mL/min  URINE MICROSCOPIC-ADD ON      Result Value Range   Squamous Epithelial / LPF RARE  RARE   WBC, UA 11-20  <3 WBC/hpf   RBC / HPF 0-2  <3 RBC/hpf   Bacteria, UA FEW (*) RARE   Urine-Other MUCOUS PRESENT    OCCULT BLOOD, POC DEVICE      Result Value Range   Fecal Occult Bld NEGATIVE  NEGATIVE  POCT I-STAT, CHEM 8      Result Value Range   Sodium 134 (*) 135 - 145 mEq/L   Potassium 4.3  3.5 - 5.1 mEq/L    Chloride 101  96 - 112 mEq/L   BUN 25 (*) 6 - 23 mg/dL   Creatinine, Ser 9.56  0.50 - 1.10 mg/dL   Glucose, Bld 213 (*) 70 - 99 mg/dL   Calcium, Ion 0.86 (*) 1.13 - 1.30 mmol/L   TCO2 26  0 - 100 mmol/L   Hemoglobin 14.3  12.0 - 15.0 g/dL   HCT 57.8  46.9 - 62.9 %   Dg Chest 2 View  02/27/2013  *RADIOLOGY REPORT*  Clinical Data: Cough, abdominal pain.  CHEST - 2 VIEW  Comparison: 02/20/2013  Findings: Patchy bilateral perihilar and upper lobe airspace opacities concerning for pneumonia.  Hyperinflation of the lungs compatible  with COPD.  Heart is normal size.  No effusions.  Left pacer is unchanged.  IMPRESSION: New multifocal patchy bilateral airspace opacities concerning for multifocal pneumonia.   Original Report Authenticated By: Charlett Nose, M.D.    Dg Chest 2 View  02/20/2013  *RADIOLOGY REPORT*  Clinical Data: Chest pain.  History of bronchiectasis.  CHEST - 2 VIEW  Comparison: 10/07/2010 and prior chest radiographs dating back to 05/17/2008  Findings: The cardiomediastinal silhouette is unremarkable. A left-sided pacemaker is unchanged. Mild right lower lobe bronchiectasis noted. There is no evidence of focal airspace disease, pulmonary edema, suspicious pulmonary nodule/mass, pleural effusion, or pneumothorax. No acute bony abnormalities are identified.  IMPRESSION: No evidence of acute abnormality.  Mild right lower lobe bronchiectasis.   Original Report Authenticated By: Harmon Pier, M.D.      MDM  Patient signed out to Dr.Nanavati at 600pm Dx #1 abdominal pain #2 diarrhea #3 leukocytosis        Doug Sou, MD 02/27/13 1816

## 2013-02-27 NOTE — ED Notes (Addendum)
Informed by lab that CMP blood sample was hemolyzed & needs to be recollected. Phlebotomist aware. ED MD informed & aware

## 2013-02-27 NOTE — ED Notes (Signed)
Pt alert, NAD, calm, interactive, skin W&D, resps e/u, speaking in clear complete setnences, states, "have flet better", reports lower abd pain constant, 7/10, denies nausea or other sx at this time, updated, pending CT results. family at [facility] x3, VSS.

## 2013-02-27 NOTE — ED Notes (Signed)
Pt arrived by [facility] for nausea onset 0100, multiple episodes of diarrhea today. Reports dark tarry stools.

## 2013-02-27 NOTE — ED Notes (Signed)
Report received, assumed care.  

## 2013-02-27 NOTE — ED Notes (Signed)
Admitting MD at BS.  

## 2013-02-27 NOTE — ED Notes (Signed)
Back from CT, alert, NAD, calm.  

## 2013-02-27 NOTE — ED Notes (Signed)
Patient transported to X-ray 

## 2013-02-27 NOTE — ED Notes (Signed)
Patient transported to CT 

## 2013-02-27 NOTE — Progress Notes (Signed)
ANTIBIOTIC CONSULT NOTE - INITIAL  Pharmacy Consult for vancomycin + levaquin + cefepime Indication: pneumonia  Allergies  Allergen Reactions  . Amoxicillin-Pot Clavulanate Other (See Comments)    Unknown   . Loratadine Other (See Comments)    Unknown     Patient Measurements:    Vital Signs: Temp: 99.7 F (37.6 C) (05/12 1830) Temp src: Oral (05/12 1830) BP: 120/63 mmHg (05/12 2045) Pulse Rate: 99 (05/12 2045) Intake/Output from previous day:   Intake/Output from this shift:    Labs:  Recent Labs  02/27/13 1617 02/27/13 1752 02/27/13 1814  WBC 31.0*  --   --   HGB 13.4  --  14.3  PLT 158  --   --   CREATININE 0.71 0.68 0.70   The CrCl is unknown because both a height and weight (above a minimum accepted value) are required for this calculation. No results found for this basename: VANCOTROUGH, VANCOPEAK, VANCORANDOM, GENTTROUGH, GENTPEAK, GENTRANDOM, TOBRATROUGH, TOBRAPEAK, TOBRARND, AMIKACINPEAK, AMIKACINTROU, AMIKACIN,  in the last 72 hours   Microbiology: No results found for this or any previous visit (from the past 720 hour(s)).  Medical History: Past Medical History  Diagnosis Date  . DJD (degenerative joint disease)   . IBS (irritable bowel syndrome)   . Goiter   . Complete heart block 1999    s/p PPM by Dr Juanda Chance, most recent generator change 2009  . Renal cyst   . COPD (chronic obstructive pulmonary disease)   . Headache   . Pacemaker   . Wears glasses   . Hiatal hernia   . Esophageal stricture   . Diverticulosis   . Asthma     Medications:  Anti-infectives   Start     Dose/Rate Route Frequency Ordered Stop   02/28/13 2200  vancomycin (VANCOCIN) IVPB 750 mg/150 ml premix     750 mg 150 mL/hr over 60 Minutes Intravenous Every 24 hours 02/27/13 2129     02/27/13 2200  levofloxacin (LEVAQUIN) IVPB 750 mg     750 mg 100 mL/hr over 90 Minutes Intravenous Every 48 hours 02/27/13 2129     02/27/13 2200  ceFEPIme (MAXIPIME) 1 g in dextrose 5 %  50 mL IVPB     1 g 100 mL/hr over 30 Minutes Intravenous Every 24 hours 02/27/13 2129     02/27/13 2000  cefTRIAXone (ROCEPHIN) 1 g in dextrose 5 % 50 mL IVPB     1 g 100 mL/hr over 30 Minutes Intravenous  Once 02/27/13 1950 02/27/13 2121   02/27/13 2000  vancomycin (VANCOCIN) IVPB 1000 mg/200 mL premix     1,000 mg 200 mL/hr over 60 Minutes Intravenous  Once 02/27/13 1950     02/27/13 2000  metroNIDAZOLE (FLAGYL) IVPB 500 mg     500 mg 100 mL/hr over 60 Minutes Intravenous  Once 02/27/13 1951       Assessment: 87 yof presented with nausea and diarrhea to start empiric vancomycin + levaquin + cefepime for possible PNA. Pt is currently afebrile but WBC is significantly elevated at 31. She has received a dose of ceftriaxone so far in the ED. Vancomycin 1gm and flagyl 500mg  are also ordered.   Goal of Therapy:  Vancomycin trough level 15-20 mcg/ml  Plan:  1. After vanc 1gm tonight, start vancomycin 750mg  IV Q24H tomorrow night 2. Levaquin 750mg  IV Q48H 3. Cefepime 1gm IV Q24H 4. F/u renal fxn, C&S, clinical status and trough at [facility], Drake Leach 02/27/2013,9:30 PM

## 2013-02-28 DIAGNOSIS — R82998 Other abnormal findings in urine: Secondary | ICD-10-CM

## 2013-02-28 DIAGNOSIS — K838 Other specified diseases of biliary tract: Secondary | ICD-10-CM | POA: Diagnosis present

## 2013-02-28 DIAGNOSIS — D72829 Elevated white blood cell count, unspecified: Secondary | ICD-10-CM | POA: Diagnosis present

## 2013-02-28 DIAGNOSIS — R7401 Elevation of levels of liver transaminase levels: Secondary | ICD-10-CM | POA: Diagnosis present

## 2013-02-28 DIAGNOSIS — R102 Pelvic and perineal pain: Secondary | ICD-10-CM | POA: Diagnosis present

## 2013-02-28 DIAGNOSIS — E871 Hypo-osmolality and hyponatremia: Secondary | ICD-10-CM

## 2013-02-28 DIAGNOSIS — R8290 Unspecified abnormal findings in urine: Secondary | ICD-10-CM | POA: Diagnosis present

## 2013-02-28 DIAGNOSIS — J479 Bronchiectasis, uncomplicated: Secondary | ICD-10-CM

## 2013-02-28 LAB — BASIC METABOLIC PANEL
BUN: 15 mg/dL (ref 6–23)
Chloride: 105 mEq/L (ref 96–112)
GFR calc Af Amer: 87 mL/min — ABNORMAL LOW (ref >90)
GFR calc non Af Amer: 75 mL/min — ABNORMAL LOW (ref >90)
Potassium: 4 mEq/L (ref 3.5–5.1)
Sodium: 137 mEq/L (ref 135–145)

## 2013-02-28 LAB — CBC
HCT: 36.7 % (ref 36.0–46.0)
Hemoglobin: 12 g/dL (ref 12.0–15.0)
MCHC: 32.7 g/dL (ref 30.0–36.0)
RBC: 4.23 MIL/uL (ref 3.87–5.11)
WBC: 21.8 10*3/uL — ABNORMAL HIGH (ref 4.0–10.5)

## 2013-02-28 LAB — CLOSTRIDIUM DIFFICILE BY PCR: Toxigenic C. Difficile by PCR: NEGATIVE

## 2013-02-28 LAB — MRSA PCR SCREENING: MRSA by PCR: NEGATIVE

## 2013-02-28 MED ORDER — SODIUM CHLORIDE 0.9 % IV SOLN
INTRAVENOUS | Status: DC
  Administered 2013-02-28: 13:00:00 via INTRAVENOUS
  Administered 2013-03-01: 20 mL/h via INTRAVENOUS

## 2013-02-28 MED ORDER — ALUM & MAG HYDROXIDE-SIMETH 200-200-20 MG/5ML PO SUSP
15.0000 mL | ORAL | Status: DC | PRN
Start: 2013-02-28 — End: 2013-03-03
  Administered 2013-02-28 – 2013-03-03 (×3): 15 mL via ORAL
  Filled 2013-02-28 (×4): qty 30

## 2013-02-28 MED ORDER — PREDNISONE 20 MG PO TABS
40.0000 mg | ORAL_TABLET | Freq: Once | ORAL | Status: AC
  Administered 2013-02-28: 40 mg via ORAL
  Filled 2013-02-28: qty 2

## 2013-02-28 MED ORDER — PREDNISONE 20 MG PO TABS
30.0000 mg | ORAL_TABLET | Freq: Every day | ORAL | Status: DC
  Administered 2013-03-01 – 2013-03-03 (×3): 30 mg via ORAL
  Filled 2013-02-28 (×5): qty 1

## 2013-02-28 NOTE — Progress Notes (Addendum)
[facility] Progress Note [place] TEAM 1 - Stepdown/ICU TEAM   Lauren Buchanan [id number]:[id number] DOB: [birth year] DOA: 02/27/2013 PCP: Evette Georges, MD  Brief narrative:  Lauren Buchanan is a 79 y.o. female with history of temporal arteritis, COPD, and cholecystecomty who presents for abdominal pain x 1 day.  She was recently placed on steroid treatment for TA. She has had suprapubic abdominal pain since yesterday that is associated with at least 3-4 episodes of diarrhea. In addition, she has been having productive cough since morning with greenish sputum. Chest xray shows multifocal pneumonia. CT of abdomen and pelvis shows dilated common bile duct at 12mm.   Assessment/Plan:    SIRS (systemic inflammatory response syndrome) - Likely source pneumonia secondary to immunocompromised state 2/2 chronic steroid use, possible UTI source as well - cont broad spectrum coverage including anti-pseudomonal anbx - cultures pending    Bronchiectasis without acute exacerbation/COPD Chest xray shows hyperinflated lungs compatible with COPD Pulmonary toileting with Incentive spirometry and flutter valve therapy Manage outpatient with PCP    Healthcare-associated pneumonia Chest Xray shows patchy bilateral perihilar and upper lobe airspace opacities concerning for pneumonia. CT abd/pelvis clarified RLL consolidation - treat with broad spectrum antibiotics  - incentive spirometry/flutter valve  - patient responding well to treatment    Dehydration with hyponatremia - KVO IV fluids since mild JVD this am and tolerating diet - monitor I/Os, vitals, peripheral edema/JVD for signs of overload.    Transaminitis/Chronic Intrahepatic and extrahepatic bile duct dilation Etiology possibly related to steroid use but likely expected finding post cholecystectomy Alkaline Phosphatase normal excluding biliary obstruction and no classic sx's Watch trends and symptoms  Diarrhea -due to  anbx's -Lomotil    Leukocytosis Etiology can be due to infection, steroids, or dehydration WBC has decreased since yesterday in response to treatment with abx/hydration - patient sx improving    Suprapubic pain, acute/Abnormal urinalysis -suspect bacterial infection with mild proteinuria and ketones - mild UA bilirubin may indicated biliary obstruction, but ALP is normal and is more c/w dehydration - treating with above antiboitics    Temporal arteritis - 40mg  Prednisone PO daily then taper to 30 mg daily for next 14 days (per pt)      PACEMAKER, PERMANENT   DVT prophylaxis: Lovenox (5/13) Code Status: FULL Family Communication:  Lives with husband, no family present at time of visit  Disposition Plan: Transfer to floor Isolation: none   Consultants: none    Procedures: none    Antibiotics: Levaquin (05/13), Cefepime (05/12)   HPI/Subjective: Patient is alert and oriented x3 with poor short term memory since starting the steroids. She states she has mild inspiratory pain, has an occasional cough, loose stools per this morning, and difficulty sleeping. She denies chest pain, SOB, choryza, fever, chills, myalgia, abdominal pain, urinary difficulty, nausea/vomiting, or bloody stools.    Objective: Blood pressure 101/38, pulse 101, temperature 97.8 F (36.6 C), temperature source Oral, resp. rate 16, height 4\' 11"  (1.499 m), weight 56.5 kg (124 lb 9 oz), SpO2 93.00%.  Intake/Output Summary (Last 24 hours) at 02/28/13 0735 Last data filed at 02/27/13 1743  Gross per 24 hour  Intake   1000 ml  Output      0 ml  Net   1000 ml     Exam: General: No acute respiratory distress Lungs: crackles bilaterally in lower lung fields on inspiration.  Cardiovascular: Regular rate and rhythm without murmur gallop or rub normal S1 and S2, no peripheral edema or JVD  Abdomen: Nontender, nondistended, soft, bowel sounds positive, no rebound, no ascites, no appreciable  mass Musculoskeletal: No significant cyanosis, clubbing of bilateral lower extremities Neurological: Alert and oriented x 3, moves all extremities x 4 without focal neurological deficits, CN 2-12 intact  Data Reviewed: Basic Metabolic Panel:  Recent Labs Lab 02/27/13 1617 02/27/13 1752 02/27/13 1814 02/28/13 0412  NA 134* 133* 134* 137  K HEMOLYZED SPECIMEN, RESULTS MAY BE AFFECTED 4.4 4.3 4.0  CL 100 99 101 105  CO2 25 26  --  26  GLUCOSE 104* 109* 110* 150*  BUN 25* 25* 25* 15  CREATININE 0.71 0.68 0.70 0.74  CALCIUM 8.2* 8.1*  --  8.0*   Liver Function Tests:  Recent Labs Lab 02/27/13 1617 02/27/13 1752  AST 60* 46*  ALT HEMOLYZED SPECIMEN, RESULTS MAY BE AFFECTED 53*  ALKPHOS HEMOLYZED SPECIMEN, RESULTS MAY BE AFFECTED 88  BILITOT 1.3* 1.6*  PROT 5.8* 5.7*  ALBUMIN 2.8* 2.9*   No results found for this basename: LIPASE, AMYLASE,  in the last 168 hours No results found for this basename: AMMONIA,  in the last 168 hours CBC:  Recent Labs Lab 02/27/13 1617 02/27/13 1814 02/28/13 0412  WBC 31.0*  --  21.8*  NEUTROABS 26.0*  --   --   HGB 13.4 14.3 12.0  HCT 39.8 42.0 36.7  MCV 86.5  --  86.8  PLT 158  --  149*   Cardiac Enzymes: No results found for this basename: CKTOTAL, CKMB, CKMBINDEX, TROPONINI,  in the last 168 hours BNP (last 3 results) No results found for this basename: PROBNP,  in the last 8760 hours CBG: No results found for this basename: GLUCAP,  in the last 168 hours  Recent Results (from the past 240 hour(s))  MRSA PCR SCREENING     Status: None   Collection Time    02/27/13 10:49 PM      Result Value Range Status   MRSA by PCR NEGATIVE  NEGATIVE Final   Comment:            The GeneXpert MRSA Assay (FDA     approved for NASAL specimens     only), is one component of a     comprehensive MRSA colonization     surveillance program. It is not     intended to diagnose MRSA     infection nor to guide or     monitor treatment for      MRSA infections.     Studies:  Recent x-ray studies have been reviewed in detail by the Attending Physician  Scheduled Meds:  Reviewed in detail by the Attending Physician  I have interviewed and examined this patient in conjunction with the physician assistant student. The attending physician was also in the room that time of examination and history. I agree with the students above physical exam findings and plan of care and have made any necessary editorial adjustments to the note  Junious Silk, ANP [facility] Office  [phone] Pager [phone]  On-Call/Text Page:      [email or web address]      password [address] [place]  If 7PM-7AM, please contact night-coverage [email or web address] Password [facility] 02/28/2013, 7:35 AM   LOS: 1 day   I have examined the patient, reviewed the chart and modified the above note which I agree with.   Exavior Kimmons,MD [phone] 02/28/2013, 3:52 PM

## 2013-02-28 NOTE — Progress Notes (Signed)
Pt transferred to 3308 on monitor, with RN from the ED. Pt able to move self over to bed, Ax4, and 5/10 pain to lower abdomen. Will get pain med and alert family to arrival. Will continue to monitor.

## 2013-02-28 NOTE — Progress Notes (Signed)
Utilization review completed.  

## 2013-03-01 DIAGNOSIS — A419 Sepsis, unspecified organism: Principal | ICD-10-CM

## 2013-03-01 DIAGNOSIS — J449 Chronic obstructive pulmonary disease, unspecified: Secondary | ICD-10-CM

## 2013-03-01 LAB — CBC
Hemoglobin: 11.9 g/dL — ABNORMAL LOW (ref 12.0–15.0)
MCH: 28.5 pg (ref 26.0–34.0)
MCHC: 32.9 g/dL (ref 30.0–36.0)
RDW: 18.9 % — ABNORMAL HIGH (ref 11.5–15.5)

## 2013-03-01 LAB — COMPREHENSIVE METABOLIC PANEL
ALT: 35 U/L (ref 0–35)
AST: 18 U/L (ref 0–37)
Alkaline Phosphatase: 88 U/L (ref 39–117)
CO2: 26 mEq/L (ref 19–32)
Calcium: 8.4 mg/dL (ref 8.4–10.5)
Chloride: 105 mEq/L (ref 96–112)
GFR calc Af Amer: >90 mL/min (ref >90)
GFR calc non Af Amer: 79 mL/min — ABNORMAL LOW (ref >90)
Glucose, Bld: 78 mg/dL (ref 70–99)
Potassium: 3.4 mEq/L — ABNORMAL LOW (ref 3.5–5.1)
Sodium: 140 mEq/L (ref 135–145)
Total Bilirubin: 0.7 mg/dL (ref 0.3–1.2)

## 2013-03-01 LAB — LIPASE, BLOOD: Lipase: 26 U/L (ref 11–59)

## 2013-03-01 MED ORDER — DIPHENOXYLATE-ATROPINE 2.5-0.025 MG PO TABS
1.0000 | ORAL_TABLET | Freq: Four times a day (QID) | ORAL | Status: DC | PRN
  Administered 2013-03-02: 1 via ORAL
  Filled 2013-03-01: qty 1

## 2013-03-02 DIAGNOSIS — D72829 Elevated white blood cell count, unspecified: Secondary | ICD-10-CM

## 2013-03-02 DIAGNOSIS — J3089 Other allergic rhinitis: Secondary | ICD-10-CM

## 2013-03-02 LAB — URINE CULTURE: Culture: NO GROWTH

## 2013-03-02 LAB — CBC
HCT: 35.3 % — ABNORMAL LOW (ref 36.0–46.0)
MCHC: 32.9 g/dL (ref 30.0–36.0)
Platelets: 170 10*3/uL (ref 150–400)
RDW: 19 % — ABNORMAL HIGH (ref 11.5–15.5)
WBC: 10.2 10*3/uL (ref 4.0–10.5)

## 2013-03-02 LAB — BASIC METABOLIC PANEL
BUN: 8 mg/dL (ref 6–23)
CO2: 23 mEq/L (ref 19–32)
Calcium: 8.1 mg/dL — ABNORMAL LOW (ref 8.4–10.5)
Chloride: 106 mEq/L (ref 96–112)
Creatinine, Ser: 0.64 mg/dL (ref 0.50–1.10)
GFR calc Af Amer: >90 mL/min (ref >90)
GFR calc non Af Amer: 79 mL/min — ABNORMAL LOW (ref >90)
Glucose, Bld: 78 mg/dL (ref 70–99)
Potassium: 3.3 mEq/L — ABNORMAL LOW (ref 3.5–5.1)
Sodium: 140 mEq/L (ref 135–145)

## 2013-03-02 MED ORDER — POTASSIUM CHLORIDE CRYS ER 20 MEQ PO TBCR
40.0000 meq | EXTENDED_RELEASE_TABLET | Freq: Once | ORAL | Status: AC
  Administered 2013-03-02: 40 meq via ORAL
  Filled 2013-03-02: qty 2

## 2013-03-02 MED ORDER — LORATADINE 10 MG PO TABS
10.0000 mg | ORAL_TABLET | Freq: Every day | ORAL | Status: DC
  Administered 2013-03-02 – 2013-03-03 (×2): 10 mg via ORAL
  Filled 2013-03-02 (×2): qty 1

## 2013-03-02 MED ORDER — LEVOFLOXACIN 500 MG PO TABS: 500.0000 mg | ORAL_TABLET | Freq: Every day | ORAL | Status: DC

## 2013-03-02 MED ORDER — LEVOFLOXACIN 750 MG PO TABS
750.0000 mg | ORAL_TABLET | ORAL | Status: DC
  Filled 2013-03-02: qty 1

## 2013-03-02 MED ORDER — DIPHENOXYLATE-ATROPINE 2.5-0.025 MG PO TABS
1.0000 | ORAL_TABLET | Freq: Two times a day (BID) | ORAL | Status: DC
  Administered 2013-03-02 (×2): 1 via ORAL
  Filled 2013-03-02: qty 1

## 2013-03-02 NOTE — Progress Notes (Signed)
Chart reviewed  [facility] Progress Note [place] TEAM 1 - Stepdown/ICU TEAM   Lauren Buchanan JXB:[id number] DOB: [birth year] DOA: 02/27/2013 PCP: Evette Georges, MD  Brief narrative:  Lauren Buchanan is a 79 y.o. female with history of temporal arteritis, COPD, and cholecystecomty who presents for abdominal pain x 1 day.  She was recently placed on steroid treatment for TA. She has had suprapubic abdominal pain since yesterday that is associated with at least 3-4 episodes of diarrhea. In addition, she has been having productive cough since morning with greenish sputum. Chest xray shows multifocal pneumonia. CT of abdomen and pelvis shows dilated common bile duct at 12mm.   Assessment/Plan:    SIRS (systemic inflammatory response syndrome) - Likely source pneumonia secondary to immunocompromised state 2/2 chronic steroid use, possible UTI source as well - cont broad spectrum coverage including anti-pseudomonal anbx - cultures pending    Bronchiectasis without acute exacerbation/COPD Chest xray shows hyperinflated lungs compatible with COPD Pulmonary toileting with Incentive spirometry and flutter valve therapy Manage outpatient with PCP    Healthcare-associated pneumonia Chest Xray shows patchy bilateral perihilar and upper lobe airspace opacities concerning for pneumonia. CT abd/pelvis clarified RLL consolidation - treat with broad spectrum antibiotics  - incentive spirometry/flutter valve  - patient responding well to treatment Home 1-2 days    Dehydration with hyponatremia - KVO IV fluids since mild JVD this am and tolerating diet - monitor I/Os, vitals, peripheral edema/JVD for signs of overload.    Transaminitis/Chronic Intrahepatic and extrahepatic bile duct dilation Etiology possibly related to steroid use but likely expected finding post cholecystectomy Alkaline Phosphatase normal excluding biliary obstruction and no classic sx's Watch trends and  symptoms  Diarrhea c diff neg. cx pending. Will order O&P -due to anbx's -Lomotil    Leukocytosis Etiology can be due to infection, steroids, or dehydration WBC has decreased since yesterday in response to treatment with abx/hydration - patient sx improving    Suprapubic pain, acute/Abnormal urinalysis -suspect bacterial infection with mild proteinuria and ketones - mild UA bilirubin may indicated biliary obstruction, but ALP is normal and is more c/w dehydration - treating with above antiboitics    Temporal arteritis - 40mg  Prednisone PO daily then taper to 30 mg daily for next 14 days (per pt)      PACEMAKER, PERMANENT   DVT prophylaxis: Lovenox (5/13) Code Status: FULL Family Communication:  Lives with husband, no family present at time of visit  Disposition Plan: Transfer to floor Isolation: none   Consultants: none    Procedures: none    Antibiotics: Levaquin (05/13), Cefepime (05/12)   HPI/Subjective: No shortness of breath. Cough productive of yellowish sputum. Eating well. Ambulating. Still having loose stools. Drinks city water. No recent sick contacts. Complaining of allergies, for which she takes Claritin. Loratadine is listed as an allergy in the computer, but patient confirms this is a running is. Will resume Claritin and removed the incorrect allergy listed.    Objective: Blood pressure 156/68, pulse 87, temperature 98.4 F (36.9 C), temperature source Oral, resp. rate 18, height 4\' 11"  (1.499 m), weight 56.5 kg (124 lb 9 oz), SpO2 98.00%.  Intake/Output Summary (Last 24 hours) at 03/02/13 1518 Last data filed at 03/02/13 0857  Gross per 24 hour  Intake    240 ml  Output    200 ml  Net     40 ml     Exam: General: No acute respiratory distress. Walking around in room of o2 Lungs: CTA  Cardiovascular: Regular rate and rhythm without murmur gallop or rub normal S1 and S2, no peripheral edema or JVD Abdomen: Nontender, nondistended, soft, bowel  sounds positive, no rebound, no ascites, no appreciable mass Musculoskeletal: No significant cyanosis, clubbing of bilateral lower extremities Neurological: Alert and oriented x 3, moves all extremities x 4 without focal neurological deficits, CN 2-12 intact  Data Reviewed: Basic Metabolic Panel:  Recent Labs Lab 02/27/13 1617 02/27/13 1752 02/27/13 1814 02/28/13 0412 03/01/13 0440 03/02/13 0704  NA 134* 133* 134* 137 140 140  K HEMOLYZED SPECIMEN, RESULTS MAY BE AFFECTED 4.4 4.3 4.0 3.4* 3.3*  CL 100 99 101 105 105 106  CO2 25 26  --  26 26 23   GLUCOSE 104* 109* 110* 150* 78 78  BUN 25* 25* 25* 15 10 8   CREATININE 0.71 0.68 0.70 0.74 0.64 0.64  CALCIUM 8.2* 8.1*  --  8.0* 8.4 8.1*   Liver Function Tests:  Recent Labs Lab 02/27/13 1617 02/27/13 1752 03/01/13 0440  AST 60* 46* 18  ALT HEMOLYZED SPECIMEN, RESULTS MAY BE AFFECTED 53* 35  ALKPHOS HEMOLYZED SPECIMEN, RESULTS MAY BE AFFECTED 88 88  BILITOT 1.3* 1.6* 0.7  PROT 5.8* 5.7* 5.6*  ALBUMIN 2.8* 2.9* 2.2*    Recent Labs Lab 03/01/13 0440  LIPASE 26   No results found for this basename: AMMONIA,  in the last 168 hours CBC:  Recent Labs Lab 02/27/13 1617 02/27/13 1814 02/28/13 0412 03/01/13 0440 03/02/13 0704  WBC 31.0*  --  21.8* 16.2* 10.2  NEUTROABS 26.0*  --   --   --   --   HGB 13.4 14.3 12.0 11.9* 11.6*  HCT 39.8 42.0 36.7 36.2 35.3*  MCV 86.5  --  86.8 86.8 86.3  PLT 158  --  149* 164 170   Cardiac Enzymes: No results found for this basename: CKTOTAL, CKMB, CKMBINDEX, TROPONINI,  in the last 168 hours BNP (last 3 results) No results found for this basename: PROBNP,  in the last 8760 hours CBG: No results found for this basename: GLUCAP,  in the last 168 hours  Recent Results (from the past 240 hour(s))  URINE CULTURE     Status: None   Collection Time    02/27/13  4:58 PM      Result Value Range Status   Specimen Description URINE, CATHETERIZED   Final   Special Requests NONE   Final    Culture  Setup Time 02/28/2013 22:30   Final   Colony Count NO GROWTH   Final   Culture NO GROWTH   Final   Report Status 03/02/2013 FINAL   Final  CULTURE, BLOOD (ROUTINE X 2)     Status: None   Collection Time    02/27/13  8:20 PM      Result Value Range Status   Specimen Description BLOOD RIGHT ARM   Final   Special Requests BOTTLES DRAWN AEROBIC AND ANAEROBIC   Final   Culture  Setup Time 02/28/2013 02:20   Final   Culture     Final   Value:        BLOOD CULTURE RECEIVED NO GROWTH TO DATE CULTURE WILL BE HELD FOR 5 DAYS BEFORE ISSUING A FINAL NEGATIVE REPORT   Report Status PENDING   Incomplete  CULTURE, BLOOD (ROUTINE X 2)     Status: None   Collection Time    02/27/13  8:20 PM      Result Value Range Status   Specimen Description BLOOD  BLOOD RIGHT FOREARM   Final   Special Requests BOTTLES DRAWN AEROBIC AND ANAEROBIC 10CC   Final   Culture  Setup Time 02/28/2013 02:20   Final   Culture     Final   Value:        BLOOD CULTURE RECEIVED NO GROWTH TO DATE CULTURE WILL BE HELD FOR 5 DAYS BEFORE ISSUING A FINAL NEGATIVE REPORT   Report Status PENDING   Incomplete  MRSA PCR SCREENING     Status: None   Collection Time    02/27/13 10:49 PM      Result Value Range Status   MRSA by PCR NEGATIVE  NEGATIVE Final   Comment:            The GeneXpert MRSA Assay (FDA     approved for NASAL specimens     only), is one component of a     comprehensive MRSA colonization     surveillance program. It is not     intended to diagnose MRSA     infection nor to guide or     monitor treatment for     MRSA infections.  STOOL CULTURE     Status: None   Collection Time    02/28/13 12:07 PM      Result Value Range Status   Specimen Description STOOL   Final   Special Requests NONE   Final   Culture NO SUSPICIOUS COLONIES, CONTINUING TO HOLD   Final   Report Status PENDING   Incomplete  CLOSTRIDIUM DIFFICILE BY PCR     Status: None   Collection Time    02/28/13 12:07 PM      Result  Value Range Status   C difficile by pcr NEGATIVE  NEGATIVE Final     On-Call/Text Page:      [email or web address]      password Willaim Bane L,MD [phone]  03/02/2013, 3:18 PM

## 2013-03-02 NOTE — Progress Notes (Signed)
ANTIBIOTIC CONSULT NOTE - FOLLOW UP  Pharmacy Consult for Vancomycin, Cefepime, Levaquin Indication: pneumonia  Allergies  Allergen Reactions  . Amoxicillin-Pot Clavulanate Other (See Comments)    Unknown   . Loratadine Other (See Comments)    Unknown     Patient Measurements: Height: 4\' 11"  (149.9 cm) Weight: 124 lb 9 oz (56.5 kg) IBW/kg (Calculated) : 43.2  Vital Signs: Temp: 98.4 F (36.9 C) (05/15 0855) Temp src: Oral (05/15 0855) BP: 150/61 mmHg (05/15 0855) Pulse Rate: 78 (05/15 0855) Intake/Output from previous day: 05/14 0701 - 05/15 0700 In: 240 [P.O.:240] Out: -  Intake/Output from this shift: Total I/O In: 240 [P.O.:240] Out: 200 [Urine:200]  Labs:  Recent Labs  02/28/13 0412 03/01/13 0440 03/02/13 0704  WBC 21.8* 16.2* 10.2  HGB 12.0 11.9* 11.6*  PLT 149* 164 170  CREATININE 0.74 0.64 0.64   Estimated Creatinine Clearance: 39.4 ml/min (by C-G formula based on Cr of 0.64). No results found for this basename: VANCOTROUGH, Leodis Binet, VANCORANDOM, GENTTROUGH, GENTPEAK, GENTRANDOM, TOBRATROUGH, TOBRAPEAK, TOBRARND, AMIKACINPEAK, AMIKACINTROU, AMIKACIN,  in the last 72 hours   Microbiology: Recent Results (from the past 720 hour(s))  CULTURE, BLOOD (ROUTINE X 2)     Status: None   Collection Time    02/27/13  8:20 PM      Result Value Range Status   Specimen Description BLOOD RIGHT ARM   Final   Special Requests BOTTLES DRAWN AEROBIC AND ANAEROBIC   Final   Culture  Setup Time 02/28/2013 02:20   Final   Culture     Final   Value:        BLOOD CULTURE RECEIVED NO GROWTH TO DATE CULTURE WILL BE HELD FOR 5 DAYS BEFORE ISSUING A FINAL NEGATIVE REPORT   Report Status PENDING   Incomplete  CULTURE, BLOOD (ROUTINE X 2)     Status: None   Collection Time    02/27/13  8:20 PM      Result Value Range Status   Specimen Description BLOOD BLOOD RIGHT FOREARM   Final   Special Requests BOTTLES DRAWN AEROBIC AND ANAEROBIC 10CC   Final   Culture  Setup  Time 02/28/2013 02:20   Final   Culture     Final   Value:        BLOOD CULTURE RECEIVED NO GROWTH TO DATE CULTURE WILL BE HELD FOR 5 DAYS BEFORE ISSUING A FINAL NEGATIVE REPORT   Report Status PENDING   Incomplete  MRSA PCR SCREENING     Status: None   Collection Time    02/27/13 10:49 PM      Result Value Range Status   MRSA by PCR NEGATIVE  NEGATIVE Final   Comment:            The GeneXpert MRSA Assay (FDA     approved for NASAL specimens     only), is one component of a     comprehensive MRSA colonization     surveillance program. It is not     intended to diagnose MRSA     infection nor to guide or     monitor treatment for     MRSA infections.  STOOL CULTURE     Status: None   Collection Time    02/28/13 12:07 PM      Result Value Range Status   Specimen Description STOOL   Final   Special Requests NONE   Final   Culture Culture reincubated for better growth   Final  Report Status PENDING   Incomplete  CLOSTRIDIUM DIFFICILE BY PCR     Status: None   Collection Time    02/28/13 12:07 PM      Result Value Range Status   C difficile by pcr NEGATIVE  NEGATIVE Final    Anti-infectives   Start     Dose/Rate Route Frequency Ordered Stop   02/28/13 2200  vancomycin (VANCOCIN) IVPB 750 mg/150 ml premix     750 mg 150 mL/hr over 60 Minutes Intravenous Every 24 hours 02/27/13 2129     02/27/13 2200  levofloxacin (LEVAQUIN) IVPB 750 mg     750 mg 100 mL/hr over 90 Minutes Intravenous Every 48 hours 02/27/13 2129     02/27/13 2200  ceFEPIme (MAXIPIME) 1 g in dextrose 5 % 50 mL IVPB     1 g 100 mL/hr over 30 Minutes Intravenous Every 24 hours 02/27/13 2129     02/27/13 2000  cefTRIAXone (ROCEPHIN) 1 g in dextrose 5 % 50 mL IVPB     1 g 100 mL/hr over 30 Minutes Intravenous  Once 02/27/13 1950 02/27/13 2121   02/27/13 2000  vancomycin (VANCOCIN) IVPB 1000 mg/200 mL premix     1,000 mg 200 mL/hr over 60 Minutes Intravenous  Once 02/27/13 1950 02/27/13 2246   02/27/13 2000   metroNIDAZOLE (FLAGYL) IVPB 500 mg     500 mg 100 mL/hr over 60 Minutes Intravenous  Once 02/27/13 1951 02/28/13 0316      Assessment: 79 year old female on Day #4 of empiric Vancomycin, Cefepime, and Levaquin for pneumonia.  She is afebrile and her WBC has normalized.  No culture growth.  Renal function is stable.    Goal of Therapy:  Vancomycin trough level 15-20 mcg/ml  Plan:  Continue current doses of Vancomycin, Cefepime, and Levaquin.   Consider narrowing to a single antibiotic to finish 8 days of antibiotic therapy.  Estella Husk, Pharm.D., BCPS Clinical Pharmacist Phone: [phone] or [phone] Pager: [phone] 03/02/2013, 2:08 PM

## 2013-03-03 DIAGNOSIS — K589 Irritable bowel syndrome without diarrhea: Secondary | ICD-10-CM

## 2013-03-03 LAB — BASIC METABOLIC PANEL
BUN: 10 mg/dL (ref 6–23)
Chloride: 104 mEq/L (ref 96–112)
Creatinine, Ser: 0.64 mg/dL (ref 0.50–1.10)
Glucose, Bld: 76 mg/dL (ref 70–99)
Potassium: 4.1 mEq/L (ref 3.5–5.1)

## 2013-03-03 LAB — LEGIONELLA ANTIGEN, URINE: Legionella Antigen, Urine: NEGATIVE

## 2013-03-03 MED ORDER — LEVOFLOXACIN 500 MG PO TABS: 500.0000 mg | ORAL_TABLET | Freq: Every day | ORAL | Status: DC

## 2013-03-03 MED ORDER — PREDNISONE 10 MG PO TABS: 30.0000 mg | ORAL_TABLET | Freq: Every day | ORAL | Status: DC

## 2013-03-03 MED ORDER — BENZONATATE 100 MG PO CAPS: 100.0000 mg | ORAL_CAPSULE | Freq: Three times a day (TID) | ORAL | Status: DC | PRN

## 2013-03-03 MED ORDER — DIPHENOXYLATE-ATROPINE 2.5-0.025 MG PO TABS: 1.0000 | ORAL_TABLET | Freq: Three times a day (TID) | ORAL | Status: DC

## 2013-03-03 NOTE — Discharge Summary (Signed)
Physician Discharge Summary  Lauren Buchanan ZOX:[id number] DOB: 1925/08/28 DOA: 02/27/2013  PCP: Evette Georges, MD  Admit date: 02/27/2013 Discharge date: 03/03/2013  Time spent: 40 minutes  Discharge Diagnoses:  Principal Problem:   SIRS (systemic inflammatory response syndrome) Active Problems:   COPD   PACEMAKER, PERMANENT   Bronchiectasis without acute exacerbation   Healthcare-associated pneumonia   Temporal arteritis   Dehydration with hyponatremia   Transaminitis   Chronic Intrahepatic and extrahepatic bile duct dilation  Discharge Condition: stable  Filed Weights   02/27/13 2215 02/28/13 0200  Weight: 56.7 kg (125 lb) 56.5 kg (124 lb 9 oz)    History of present illness:  Lauren Buchanan is a 79 y.o. female who was restaged diagnosed with temporal arteritis and is on steroids presents with complaints of abdominal pain. Patient's symptoms started since yesterday. Pain is mostly suprapubic associated with at least 3-4 episodes of diarrhea. Denies any nausea vomiting. Patient has been having productive cough since morning which is having greenish sputum. Denies any fever chills. Chest x-ray shows multifocal pneumonia. CT abdomen and pelvis shows intrahepatic and extrahepatic biliary ductal the location   Hospital Course:   Patient was admitted for HCAP associated with diarrhea. She was started on broad-spectrum antibiotics. She ruled out for C. Difficile, and stool studies to date are otherwise negative. By the time of discharge she is tolerating a diet. Her stools are more formed and less frequent. She is able to ambulate in the hallways without difficulty. She is tolerating oral antibiotics. Stable for discharge.  Discharge Exam: Filed Vitals:   03/02/13 2155 03/02/13 2156 03/03/13 0409 03/03/13 0920  BP:  144/77 128/66 142/58  Pulse:  79 76 91  Temp:  97.6 F (36.4 C) 98.4 F (36.9 C) 98.1 F (36.7 C)  TempSrc:  Oral Oral Oral  Resp:  18 19 18   Height:       Weight:      SpO2: 98% 96% 91% 96%    General: nontoxic appearing Cardiovascular: RRR without MGR Respiratory: CTA without WRR Abd S, NT, ND  Discharge Instructions  Discharge Orders   Future Appointments Provider Department Dept Phone   04/13/2013 9:15 AM Hillis Range, MD [facility] Main [facility] [place]) [phone]   07/18/2013 9:30 AM Barbaraann Share, MD  [facility] [phone]   Future Orders Complete By Expires     Diet - low sodium heart healthy  As directed     Increase activity slowly  As directed         Medication List    TAKE these medications       albuterol 108 (90 BASE) MCG/ACT inhaler  Commonly known as:  PROVENTIL HFA;VENTOLIN HFA  Inhale 2 puffs into the lungs every 6 (six) hours as needed for shortness of breath.     alendronate 70 MG tablet  Commonly known as:  FOSAMAX  Take 70 mg by mouth every Thursday. Take with a full glass of water on an empty stomach.     aspirin EC 81 MG tablet  Take 81 mg by mouth daily.     benzonatate 100 MG capsule  Commonly known as:  TESSALON PERLES  Take 1 capsule (100 mg total) by mouth 3 (three) times daily as needed for cough.     CITRACAL PETITES/VITAMIN D 200-250 MG-UNIT Tabs  Generic drug:  Calcium Citrate-Vitamin D  Take 1 tablet by mouth daily.     diphenoxylate-atropine 2.5-0.025 MG per tablet  Commonly known as:  LOMOTIL  Take 1 tablet by mouth 3 (three) times daily. For 3 days, then as needed for diarrhea     GLUCOSAMINE PO  Take 1 tablet by mouth daily.     HYDROcodone-acetaminophen 5-325 MG per tablet  Commonly known as:  NORCO/VICODIN  Take 1 tablet by mouth every 4 (four) hours as needed for pain.     hyoscyamine 0.125 MG SL tablet  Commonly known as:  LEVSIN SL  Place 1 tablet (0.125 mg total) under the tongue every 4 (four) hours as needed.     levofloxacin 500 MG tablet  Commonly known as:  LEVAQUIN  Take 1 tablet (500 mg total) by mouth daily.     metoprolol succinate  100 MG 24 hr tablet  Commonly known as:  TOPROL-XL  Take 50 mg by mouth daily.     mometasone-formoterol 100-5 MCG/ACT Aero  Commonly known as:  DULERA  Inhale 2 puffs into the lungs daily as needed for shortness of breath.     omeprazole 20 MG tablet  Commonly known as:  PRILOSEC OTC  Take 20 mg by mouth daily.     predniSONE 10 MG tablet  Commonly known as:  DELTASONE  Take 3 tablets (30 mg total) by mouth daily.     vitamin B-12 500 MCG tablet  Commonly known as:  CYANOCOBALAMIN  Take 500 mcg by mouth daily.     vitamin C 500 MG tablet  Commonly known as:  ASCORBIC ACID  Take 500 mg by mouth daily.       Allergies  Allergen Reactions  . Amoxicillin-Pot Clavulanate Other (See Comments)    Unknown        Follow-up Information   Follow up with TODD,JEFFREY ALLEN, MD In 2 weeks. (repeat chest xray in 4-6 weeks)    Contact information:   [address] Christena Flake [facility] [place] [address] [phone]       Follow up with Azzie Roup, MD In 1 week.   Contact information:   [address] [place] [address] [phone]        The results of significant diagnostics from this hospitalization (including imaging, microbiology, ancillary and laboratory) are listed below for reference.    Significant Diagnostic Studies: Dg Chest 2 View  02/27/2013   *RADIOLOGY REPORT*  Clinical Data: Cough, abdominal pain.  CHEST - 2 VIEW  Comparison: 02/20/2013  Findings: Patchy bilateral perihilar and upper lobe airspace opacities concerning for pneumonia.  Hyperinflation of the lungs compatible with COPD.  Heart is normal size.  No effusions.  Left pacer is unchanged.  IMPRESSION: New multifocal patchy bilateral airspace opacities concerning for multifocal pneumonia.   Original Report Authenticated By: Charlett Nose, M.D.   Dg Chest 2 View  02/20/2013   *RADIOLOGY REPORT*  Clinical Data: Chest pain.  History of bronchiectasis.  CHEST - 2 VIEW  Comparison: 10/07/2010  and prior chest radiographs dating back to 05/17/2008  Findings: The cardiomediastinal silhouette is unremarkable. A left-sided pacemaker is unchanged. Mild right lower lobe bronchiectasis noted. There is no evidence of focal airspace disease, pulmonary edema, suspicious pulmonary nodule/mass, pleural effusion, or pneumothorax. No acute bony abnormalities are identified.  IMPRESSION: No evidence of acute abnormality.  Mild right lower lobe bronchiectasis.   Original Report Authenticated By: Harmon Pier, M.D.   Ct Abdomen Pelvis W Contrast  02/27/2013   *RADIOLOGY REPORT*  Clinical Data: Nausea, diarrhea.  CT ABDOMEN AND PELVIS WITH CONTRAST  Technique:  Multidetector CT imaging of the abdomen and pelvis was  performed following the standard protocol during bolus administration of intravenous contrast.  Contrast: OMNIPAQUE IOHEXOL 300 MG/ML  SOLN  Comparison: 01/24/2013  Findings: Area of airspace consolidation noted posteriorly in the right lower lobe.  This is concerning for pneumonia.  Scarring in the left lung base.  Small hiatal hernia.  No effusions.  Prior cholecystectomy.  Mildly dilated common bile duct which has increased since prior study measuring up to 12 mm.  Mild intrahepatic biliary ductal dilatation also appears slightly increased since prior study.  No focal hepatic abnormality.  Calcifications in the spleen compatible with old granulomatous disease.  Pancreas, adrenals are unremarkable.  Small hypodensities in the kidneys compatible with small cysts, unchanged.  No hydronephrosis.  There is sigmoid diverticulosis.  No active diverticulitis.  Small bowel is decompressed.  Appendix is visualized and is normal.  No free fluid, free air or adenopathy.  Urinary bladder, uterus and adnexa unremarkable.  Aorta is normal caliber.  No acute bony abnormality.  Degenerative changes in the lumbar spine.  IMPRESSION: Consolidation in the right lower lobe concerning for pneumonia. This is new since recent  study.  Increasing intrahepatic and extrahepatic biliary ductal dilatation. While this may be related to prior cholecystectomy and patient's age, recommend correlation with LFTs to exclude biliary obstruction.  If further evaluation is felt warranted, MRCP may be beneficial.  Sigmoid diverticulosis.  Small hiatal hernia.   Original Report Authenticated By: Charlett Nose, M.D.    Microbiology: Recent Results (from the past 240 hour(s))  URINE CULTURE     Status: None   Collection Time    02/27/13  4:58 PM      Result Value Range Status   Specimen Description URINE, CATHETERIZED   Final   Special Requests NONE   Final   Culture  Setup Time 02/28/2013 22:30   Final   Colony Count NO GROWTH   Final   Culture NO GROWTH   Final   Report Status 03/02/2013 FINAL   Final  CULTURE, BLOOD (ROUTINE X 2)     Status: None   Collection Time    02/27/13  8:20 PM      Result Value Range Status   Specimen Description BLOOD RIGHT ARM   Final   Special Requests BOTTLES DRAWN AEROBIC AND ANAEROBIC   Final   Culture  Setup Time 02/28/2013 02:20   Final   Culture     Final   Value:        BLOOD CULTURE RECEIVED NO GROWTH TO DATE CULTURE WILL BE HELD FOR 5 DAYS BEFORE ISSUING A FINAL NEGATIVE REPORT   Report Status PENDING   Incomplete  CULTURE, BLOOD (ROUTINE X 2)     Status: None   Collection Time    02/27/13  8:20 PM      Result Value Range Status   Specimen Description BLOOD BLOOD RIGHT FOREARM   Final   Special Requests BOTTLES DRAWN AEROBIC AND ANAEROBIC 10CC   Final   Culture  Setup Time 02/28/2013 02:20   Final   Culture     Final   Value:        BLOOD CULTURE RECEIVED NO GROWTH TO DATE CULTURE WILL BE HELD FOR 5 DAYS BEFORE ISSUING A FINAL NEGATIVE REPORT   Report Status PENDING   Incomplete  MRSA PCR SCREENING     Status: None   Collection Time    02/27/13 10:49 PM      Result Value Range Status   MRSA by  PCR NEGATIVE  NEGATIVE Final   Comment:            The GeneXpert MRSA Assay (FDA      approved for NASAL specimens     only), is one component of a     comprehensive MRSA colonization     surveillance program. It is not     intended to diagnose MRSA     infection nor to guide or     monitor treatment for     MRSA infections.  STOOL CULTURE     Status: None   Collection Time    02/28/13 12:07 PM      Result Value Range Status   Specimen Description STOOL   Final   Special Requests NONE   Final   Culture NO SUSPICIOUS COLONIES, CONTINUING TO HOLD   Final   Report Status PENDING   Incomplete  CLOSTRIDIUM DIFFICILE BY PCR     Status: None   Collection Time    02/28/13 12:07 PM      Result Value Range Status   C difficile by pcr NEGATIVE  NEGATIVE Final     Labs: Basic Metabolic Panel:  Recent Labs Lab 02/27/13 1752 02/27/13 1814 02/28/13 0412 03/01/13 0440 03/02/13 0704 03/03/13 0545  NA 133* 134* 137 140 140 138  K 4.4 4.3 4.0 3.4* 3.3* 4.1  CL 99 101 105 105 106 104  CO2 26  --  26 26 23 28   GLUCOSE 109* 110* 150* 78 78 76  BUN 25* 25* 15 10 8 10   CREATININE 0.68 0.70 0.74 0.64 0.64 0.64  CALCIUM 8.1*  --  8.0* 8.4 8.1* 8.2*   Liver Function Tests:  Recent Labs Lab 02/27/13 1617 02/27/13 1752 03/01/13 0440  AST 60* 46* 18  ALT HEMOLYZED SPECIMEN, RESULTS MAY BE AFFECTED 53* 35  ALKPHOS HEMOLYZED SPECIMEN, RESULTS MAY BE AFFECTED 88 88  BILITOT 1.3* 1.6* 0.7  PROT 5.8* 5.7* 5.6*  ALBUMIN 2.8* 2.9* 2.2*    Recent Labs Lab 03/01/13 0440  LIPASE 26   No results found for this basename: AMMONIA,  in the last 168 hours CBC:  Recent Labs Lab 02/27/13 1617 02/27/13 1814 02/28/13 0412 03/01/13 0440 03/02/13 0704  WBC 31.0*  --  21.8* 16.2* 10.2  NEUTROABS 26.0*  --   --   --   --   HGB 13.4 14.3 12.0 11.9* 11.6*  HCT 39.8 42.0 36.7 36.2 35.3*  MCV 86.5  --  86.8 86.8 86.3  PLT 158  --  149* 164 170   Cardiac Enzymes: No results found for this basename: CKTOTAL, CKMB, CKMBINDEX, TROPONINI,  in the last 168 hours BNP: BNP  (last 3 results) No results found for this basename: PROBNP,  in the last 8760 hours CBG: No results found for this basename: GLUCAP,  in the last 168 hours  Signed:  Tinlee Navarrette L  [facility] 03/03/2013, 11:08 AM

## 2013-03-03 NOTE — Progress Notes (Signed)
D/c INSTRUCTIONS  GIVEN,DAUGHTER  REVIEWED WITH  PATIENT,  VERBALIZED  UNDERSTANDING.

## 2013-03-04 LAB — STOOL CULTURE

## 2013-03-06 LAB — CULTURE, BLOOD (ROUTINE X 2): Culture: NO GROWTH

## 2013-03-06 LAB — OVA AND PARASITE EXAMINATION

## 2013-03-08 DIAGNOSIS — I441 Atrioventricular block, second degree: Secondary | ICD-10-CM

## 2013-04-04 ENCOUNTER — Other Ambulatory Visit: Payer: Self-pay | Admitting: *Deleted

## 2013-04-04 MED ORDER — METOPROLOL SUCCINATE ER 100 MG PO TB24: ORAL_TABLET | ORAL | Status: DC

## 2013-04-13 ENCOUNTER — Encounter: Payer: Medicare Other | Admitting: Internal Medicine

## 2013-05-16 ENCOUNTER — Encounter: Payer: Self-pay | Admitting: *Deleted

## 2013-05-17 ENCOUNTER — Telehealth: Payer: Self-pay | Admitting: Internal Medicine

## 2013-05-17 NOTE — Telephone Encounter (Signed)
05-17-13 lmm @ 402pm pt past due with allred, ok to see Brooke/mt

## 2013-05-18 ENCOUNTER — Ambulatory Visit: Payer: Medicare Other | Admitting: Internal Medicine

## 2013-06-01 ENCOUNTER — Encounter: Payer: Self-pay | Admitting: Family Medicine

## 2013-06-01 ENCOUNTER — Ambulatory Visit (INDEPENDENT_AMBULATORY_CARE_PROVIDER_SITE_OTHER): Payer: Medicare Other | Admitting: Family Medicine

## 2013-06-01 ENCOUNTER — Telehealth: Payer: Self-pay | Admitting: Gastroenterology

## 2013-06-01 VITALS — BP 122/72 | Temp 97.7°F | Wt 127.0 lb

## 2013-06-01 DIAGNOSIS — R197 Diarrhea, unspecified: Secondary | ICD-10-CM

## 2013-06-01 LAB — CBC WITH DIFFERENTIAL/PLATELET
Basophils Absolute: 0.1 10*3/uL (ref 0.0–0.1)
Eosinophils Absolute: 0.1 10*3/uL (ref 0.0–0.7)
Hemoglobin: 13.3 g/dL (ref 12.0–15.0)
Lymphocytes Relative: 19.8 % (ref 12.0–46.0)
MCHC: 32.6 g/dL (ref 30.0–36.0)
Neutro Abs: 6.6 10*3/uL (ref 1.4–7.7)
Platelets: 280 10*3/uL (ref 150.0–400.0)
RDW: 14.6 % (ref 11.5–14.6)

## 2013-06-01 NOTE — Telephone Encounter (Signed)
Patient scheduled to see Mike Gip PA tomorrow 06/02/13 1:30

## 2013-06-01 NOTE — Progress Notes (Signed)
Chief Complaint  Patient presents with  . Diarrhea    light stools    HPI:  79 yo pt of Dr. Tawanna Cooler here for acute visit for loose stools: -sees GI, PMH GERD, IBS, diverticulosis, elevated alk phos -stools: have been intermittently loose, 1-2 per day, reports light brown in color (told nurse initially dark stools but now reports miss spoke and they are actually a little lighter brown then usual), occ darker stools - not melena or blood per her report  -she thought it was related to her medications from rheumatolgist -denies: fevers, chills, malaise, vomiting, nausea, mucus, foul odor, abd pain, hematochezia, melena, recent travel -she did just get off an antibiotic a week ago that she was on for 3 weeks from urgent care - she is not sure what antibiotic or what it was for -on [facility], hospitalized in May for HCAP and had some diarrhea then with neg stool studies, CT abd with intra and extrahepatic biliary duct dilation - ? Related to prior chole  ROS: See pertinent positives and negatives per HPI.  Past Medical History  Diagnosis Date  . DJD (degenerative joint disease)   . IBS (irritable bowel syndrome)   . Goiter   . Complete heart block 1999    s/p PPM by Dr Juanda Chance, most recent generator change 2009  . Renal cyst   . COPD (chronic obstructive pulmonary disease)   . Headache(784.0)   . Pacemaker   . Wears glasses   . Hiatal hernia   . Esophageal stricture   . Diverticulosis   . Asthma     Family History  Problem Relation Age of Onset  . Heart disease Mother   . Breast cancer Mother   . Stomach cancer Father     History   Social History  . Marital Status: Married    Spouse Name: N/A    Number of Children: N/A  . Years of Education: N/A   Occupational History  . Retired    Social History Main Topics  . Smoking status: Never Smoker   . Smokeless tobacco: None  . Alcohol Use: No  . Drug Use: No  . Sexual Activity: None   Other Topics Concern  . None   Social  History Narrative  . None    Current outpatient prescriptions:albuterol (PROVENTIL HFA;VENTOLIN HFA) 108 (90 BASE) MCG/ACT inhaler, Inhale 2 puffs into the lungs every 6 (six) hours as needed for shortness of breath., Disp: , Rfl: ;  alendronate (FOSAMAX) 70 MG tablet, Take 70 mg by mouth every Thursday. Take with a full glass of water on an empty stomach., Disp: , Rfl: ;  aspirin EC 81 MG tablet, Take 81 mg by mouth daily., Disp: , Rfl:  Calcium Citrate-Vitamin D (CITRACAL PETITES/VITAMIN D) 200-250 MG-UNIT TABS, Take 1 tablet by mouth daily.  , Disp: , Rfl: ;  diphenoxylate-atropine (LOMOTIL) 2.5-0.025 MG per tablet, Take 1 tablet by mouth 3 (three) times daily. For 3 days, then as needed for diarrhea, Disp: 20 tablet, Rfl: 0;  GLUCOSAMINE PO, Take 1 tablet by mouth daily.  , Disp: , Rfl:  HYDROcodone-acetaminophen (NORCO/VICODIN) 5-325 MG per tablet, Take 1 tablet by mouth every 4 (four) hours as needed for pain., Disp: 20 tablet, Rfl: 0;  hyoscyamine (LEVSIN SL) 0.125 MG SL tablet, Place 1 tablet (0.125 mg total) under the tongue every 4 (four) hours as needed., Disp: 50 tablet, Rfl: 4;  metoprolol succinate (TOPROL-XL) 100 MG 24 hr tablet, Half tab daily, Disp: 45 tablet,  Rfl: 3 mometasone-formoterol (DULERA) 100-5 MCG/ACT AERO, Inhale 2 puffs into the lungs daily as needed for shortness of breath. , Disp: , Rfl: ;  omeprazole (PRILOSEC OTC) 20 MG tablet, Take 20 mg by mouth daily., Disp: , Rfl: ;  predniSONE (DELTASONE) 10 MG tablet, Take 10 mg by mouth daily., Disp: , Rfl: ;  vitamin B-12 (CYANOCOBALAMIN) 500 MCG tablet, Take 500 mcg by mouth daily.  , Disp: , Rfl:  vitamin C (ASCORBIC ACID) 500 MG tablet, Take 500 mg by mouth daily.  , Disp: , Rfl: ;  benzonatate (TESSALON PERLES) 100 MG capsule, Take 1 capsule (100 mg total) by mouth 3 (three) times daily as needed for cough., Disp: 20 capsule, Rfl: 0;  levofloxacin (LEVAQUIN) 500 MG tablet, Take 1 tablet (500 mg total) by mouth daily., Disp: 4  tablet, Rfl: 0;  [DISCONTINUED] loratadine (CLARITIN) 10 MG tablet, Take 10 mg by mouth daily.  , Disp: , Rfl:   EXAM:  Filed Vitals:   06/01/13 0921  BP: 122/72  Temp: 97.7 F (36.5 C)    Body mass index is 25.64 kg/(m^2).  GENERAL: vitals reviewed and listed above, alert, oriented, appears well hydrated and in no acute distress  HEENT: atraumatic, conjunttiva clear, no obvious abnormalities on inspection of external nose and ears  NECK: no obvious masses on inspection  LUNGS: clear to auscultation bilaterally, no wheezes, rales or rhonchi, good air movement  CV: HRRR, no peripheral edema  ABD: BS+, soft, NTTP, no rebound and guarding  MS: moves all extremities without noticeable abnormality  PSYCH: pleasant and cooperative, no obvious depression or anxiety  ASSESSMENT AND PLAN:  Discussed the following assessment and plan:  Diarrhea - Plan: CBC with Differential, C. difficile, PCR   -benign exam, stool normal on rectal and hemocult negative -suspect IBS, but pt very worried about this and wants to see her gastroenterologist -on [facility] seem this has been going on for some time with neg stool studies in hospital, normal LFTs on discharge in may -will check CBC, c. Diff, advised avoiding dairy and probiotic and following up with GI -Patient advised to return or notify a doctor immediately if symptoms worsen or persist or new concerns arise.  There are no Patient Instructions on file for this visit.   Lauren Basque R.

## 2013-06-01 NOTE — Patient Instructions (Signed)
-   we will call you about an appointment with the gastroenterologist  -in meantime no dairy and probiotic  -see a doctor if worsening or concerns  -follow up with your doctor in 2 months

## 2013-06-02 ENCOUNTER — Encounter: Payer: Self-pay | Admitting: Physician Assistant

## 2013-06-02 ENCOUNTER — Ambulatory Visit (INDEPENDENT_AMBULATORY_CARE_PROVIDER_SITE_OTHER): Payer: Medicare Other | Admitting: Physician Assistant

## 2013-06-02 VITALS — BP 136/80 | HR 80 | Ht 59.25 in | Wt 128.1 lb

## 2013-06-02 DIAGNOSIS — M316 Other giant cell arteritis: Secondary | ICD-10-CM

## 2013-06-02 DIAGNOSIS — R195 Other fecal abnormalities: Secondary | ICD-10-CM

## 2013-06-02 LAB — CLOSTRIDIUM DIFFICILE BY PCR: Toxigenic C. Difficile by PCR: NOT DETECTED

## 2013-06-02 MED ORDER — SACCHAROMYCES BOULARDII 250 MG PO CAPS: 250.0000 mg | ORAL_CAPSULE | Freq: Two times a day (BID) | ORAL | Status: DC

## 2013-06-02 NOTE — Patient Instructions (Addendum)
Take 1 tab twice a day of a Probiotic daily for 1 month.  Florastor is the name.  We have given you samples.  You can get this at [facility] [employer], over the counter at the pharmacy.    Please call us at [phone] if you notice any recurrent bleeding or real dark stools.

## 2013-06-02 NOTE — Progress Notes (Signed)
Subjective:    Patient ID: Lauren Buchanan, female    DOB: 1925/08/06, 79 y.o.   MRN: [id number]  HPI  Lauren Buchanan is a pleasant 79 year old white female known to Dr. Russella Dar. She was last seen in March of 2014 with a mildly elevated alkaline phosphatase.. She have further testing with a 5'-nucleotidase which was in the normal range and therefore was not felt that her alkaline phosphatase elevation was biliary in origin.. She had just been diagnosed with giant cell arteritis and has been on steroids now over the past several months. She says she has not felt well for some time, and also had a hospitalization in May of 2014 with pneumonia She had stool studies done in may as well because she had diarrhea without hospitalization but all of those were negative Her history is pertinent for IBS, she is status post pacemaker placement for complete heart block, has history of COPD, bronchiectasis, memory loss, and chronic GERD. She was seen by primary care earlier this week with complaints of loose dark stool. Apparently she was Hemoccult-positive, however labs which were done yesterday showed a hemoglobin of 13.3 hematocrit of 40.7 and MCV of 90.5. Stool for C. difficile PCR was also done and is negative. Patient tells me that she has not been having diarrhea but had noticed a very black charcoal appearing stools several days ago and has noticed that her stools have been more grainy appearing over the past couple of weeks. She says she's having one or 2 bowel movements per day. She has no complaints of abdominal pain or cramping no bloating or distention. No nausea and her weight has been stable. She has had a couple of courses of antibiotics this summer. She says she's not been on any NSAIDs. She says she feels that her memory is even worse since being diagnosed with the giant cell arteritis and that she has gone to a lot of doctors and cannot remember most of the details.   Review of Systems  Constitutional:  Positive for fatigue.  HENT: Negative.   Eyes: Negative.   Respiratory: Negative.   Cardiovascular: Negative.   Gastrointestinal: Negative.   Endocrine: Negative.   Genitourinary: Negative.   Skin: Negative.   Allergic/Immunologic: Negative.   Neurological: Positive for weakness.  Hematological: Negative.   Psychiatric/Behavioral: Positive for decreased concentration.   Outpatient Prescriptions Prior to Visit  Medication Sig Dispense Refill  . albuterol (PROVENTIL HFA;VENTOLIN HFA) 108 (90 BASE) MCG/ACT inhaler Inhale 2 puffs into the lungs every 6 (six) hours as needed for shortness of breath.      Marland Kitchen alendronate (FOSAMAX) 70 MG tablet Take 70 mg by mouth every Thursday. Take with a full glass of water on an empty stomach.      Marland Kitchen aspirin EC 81 MG tablet Take 81 mg by mouth daily.      . benzonatate (TESSALON PERLES) 100 MG capsule Take 1 capsule (100 mg total) by mouth 3 (three) times daily as needed for cough.  20 capsule  0  . Calcium Citrate-Vitamin D (CITRACAL PETITES/VITAMIN D) 200-250 MG-UNIT TABS Take 1 tablet by mouth daily.        . diphenoxylate-atropine (LOMOTIL) 2.5-0.025 MG per tablet Take 1 tablet by mouth 3 (three) times daily. For 3 days, then as needed for diarrhea  20 tablet  0  . GLUCOSAMINE PO Take 1 tablet by mouth daily.        Marland Kitchen HYDROcodone-acetaminophen (NORCO/VICODIN) 5-325 MG per tablet Take 1 tablet by mouth every  4 (four) hours as needed for pain.  20 tablet  0  . hyoscyamine (LEVSIN SL) 0.125 MG SL tablet Place 1 tablet (0.125 mg total) under the tongue every 4 (four) hours as needed.  50 tablet  4  . metoprolol succinate (TOPROL-XL) 100 MG 24 hr tablet Half tab daily  45 tablet  3  . mometasone-formoterol (DULERA) 100-5 MCG/ACT AERO Inhale 2 puffs into the lungs daily as needed for shortness of breath.       Marland Kitchen omeprazole (PRILOSEC OTC) 20 MG tablet Take 20 mg by mouth daily.      . predniSONE (DELTASONE) 10 MG tablet Take 10 mg by mouth daily.      . vitamin  B-12 (CYANOCOBALAMIN) 500 MCG tablet Take 500 mcg by mouth daily.        . vitamin C (ASCORBIC ACID) 500 MG tablet Take 500 mg by mouth daily.        Marland Kitchen levofloxacin (LEVAQUIN) 500 MG tablet Take 1 tablet (500 mg total) by mouth daily.  4 tablet  0   No facility-administered medications prior to visit.   Allergies  Allergen Reactions  . Amoxicillin-Pot Clavulanate Other (See Comments)    Unknown    Patient Active Problem List   Diagnosis Date Noted  . Giant cell arteritis 06/02/2013  . Suprapubic pain, acute 02/28/2013  . Dehydration with hyponatremia 02/28/2013  . Transaminitis 02/28/2013  . 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 Leukocytosis 02/28/2013  . Abnormal urinalysis 02/28/2013  . Abdominal pain 02/27/2013  . SIRS (systemic inflammatory response syndrome) 02/27/2013  . Healthcare-associated pneumonia 02/27/2013  . Temporal arteritis 02/27/2013  . Blurred vision, bilateral 12/13/2012  . Other malaise and fatigue 12/13/2012  . Bronchiectasis with acute exacerbation 03/30/2012  . Bronchiectasis without acute exacerbation 01/01/2012  . MEMORY LOSS 08/07/2010  . SVT/ PSVT/ PAT 09/20/2009  . COPD 08/21/2009  . PLANTAR FASCIITIS, BILATERAL 12/07/2008  . AV BLOCK, COMPLETE 09/07/2008  . PACEMAKER, PERMANENT 09/07/2008  . RHINITIS, ALLERGIC NEC 05/13/2007  . GOITER NOS 05/12/2007  . IBS 05/12/2007  . DEGENERATIVE JOINT DISEASE 05/12/2007   History  Substance Use Topics  . Smoking status: Never Smoker   . Smokeless tobacco: Not on file  . Alcohol Use: No      family history includes Breast cancer in her mother; Heart disease in her mother; Stomach cancer in her father.  Objective:   Physical Exam  well-developed elderly white female in no acute distress, pleasant blood pressure 136/80 pulse 80 height 4 foot 11 weight 128. HEENT; nontraumatic normocephalic EOMI PERRLA sclera anicteric, Supple; no JVD, Cardiovascular; regular rate and  rhythm with S1-S2 no murmur rub or gallop, Pulmonary; clear bilaterally, Abdomen; soft nontender nondistended bowel sounds are active there is no palpable mass or hepatosplenomegaly, Rectal; exam no external lesion noted stool is brown and Hemoccult negative , Extremities; no clubbing cyanosis or edema skin warm and dry, Psych; mood and affect normal and appropriate.        Assessment & Plan:  #77  79 year old female with recent change in bowels noticing dark stool x1 within the past week. She is Hemoccult-negative today, has a hemoglobin of 13.3 and stool for C. difficile PCR was negative yesterday. I suspect the changes in her bowels were secondary to recent courses of antibiotics. She does not have any evidence of GI bleeding at this time. #2 history of diverticulosis #3 IBS #4 giant cell arteritis #5 bronchiectasis #6 status post pacemaker placement for  complete heart block  Plan; patient is reassured, will start her on a course of a probiotic in the form of florastor  one by mouth twice daily for one month. She is advised that should she have any recurrence of dark or black stools to call our office and we will be happy to reevaluate her

## 2013-06-04 NOTE — Progress Notes (Signed)
Reviewed and agree with management plan.  Kailiana Granquist T. Brookelyn Gaynor, MD FACG 

## 2013-06-07 ENCOUNTER — Encounter: Payer: Self-pay | Admitting: Internal Medicine

## 2013-06-07 DIAGNOSIS — I441 Atrioventricular block, second degree: Secondary | ICD-10-CM

## 2013-06-08 ENCOUNTER — Telehealth: Payer: Self-pay | Admitting: Internal Medicine

## 2013-06-08 NOTE — Telephone Encounter (Addendum)
05-17-13 lmm @ 402pm, to set up past due pacer check with allred or brooke/mt 06-08-13 left another message at 435pm/mt 07-06-13 SENT PAST DUE LETTER/MT

## 2013-07-06 ENCOUNTER — Encounter: Payer: Self-pay | Admitting: Internal Medicine

## 2013-07-18 ENCOUNTER — Encounter: Payer: Self-pay | Admitting: Pulmonary Disease

## 2013-07-18 ENCOUNTER — Ambulatory Visit (INDEPENDENT_AMBULATORY_CARE_PROVIDER_SITE_OTHER): Payer: Medicare Other | Admitting: Pulmonary Disease

## 2013-07-18 VITALS — BP 128/78 | HR 78 | Temp 98.2°F | Ht 59.0 in | Wt 123.4 lb

## 2013-07-18 DIAGNOSIS — J479 Bronchiectasis, uncomplicated: Secondary | ICD-10-CM

## 2013-07-18 DIAGNOSIS — J449 Chronic obstructive pulmonary disease, unspecified: Secondary | ICD-10-CM

## 2013-07-18 DIAGNOSIS — Z23 Encounter for immunization: Secondary | ICD-10-CM

## 2013-07-18 NOTE — Patient Instructions (Addendum)
No change in medications Stay active with an exercise program Will give you the flu shot today. followup with me in 6mos.

## 2013-07-18 NOTE — Progress Notes (Signed)
  Subjective:    Patient ID: Lauren Buchanan, female    DOB: 02-20-1925, 79 y.o.   MRN: [id number]  HPI The patient comes in today for followup of her known bronchiectasis with underlying obstructive lung disease.  She has maintained on dulera, but did have an admission in May for pneumonia.  She has done well since that time, and feels that her breathing is adequate.  She denies any chest congestion or cough currently.  She has had decreased appetite, but her weight is actually up 2 pounds from last visit here.   Review of Systems  Constitutional: Negative for fever and unexpected weight change.  HENT: Negative for ear pain, nosebleeds, congestion, sore throat, rhinorrhea, sneezing, trouble swallowing, dental problem, postnasal drip and sinus pressure.   Eyes: Negative for redness and itching.  Respiratory: Negative for cough, chest tightness, shortness of breath and wheezing.   Cardiovascular: Negative for palpitations and leg swelling.  Gastrointestinal: Negative for nausea and vomiting.  Genitourinary: Negative for dysuria.  Musculoskeletal: Negative for joint swelling.  Skin: Negative for rash.  Neurological: Negative for headaches.  Hematological: Does not bruise/bleed easily.  Psychiatric/Behavioral: Negative for dysphoric mood. The patient is not nervous/anxious.        Objective:   Physical Exam Thin female in no acute distress Nose without purulence or discharge noted Neck without lymphadenopathy or thyromegaly Chest with very faint basilar crackles, no wheezing Cardiac exam with regular rate and rhythm Lower extremities without edema, no cyanosis Alert and oriented, moves all 4 extremities.        Assessment & Plan:

## 2013-07-18 NOTE — Assessment & Plan Note (Signed)
The patient is maintaining on dulera, and is satisfied with her exertional tolerance.  She has no significant cough.

## 2013-07-18 NOTE — Assessment & Plan Note (Signed)
The patient has done well since the hospital admission in May with pneumonia.  She is not having any chest congestion, cough, or mucus at this time.

## 2013-08-25 ENCOUNTER — Encounter: Payer: Self-pay | Admitting: Internal Medicine

## 2013-08-25 ENCOUNTER — Ambulatory Visit (INDEPENDENT_AMBULATORY_CARE_PROVIDER_SITE_OTHER): Payer: Medicare Other | Admitting: Internal Medicine

## 2013-08-25 VITALS — BP 112/64 | HR 78 | Ht 59.0 in | Wt 122.1 lb

## 2013-08-25 DIAGNOSIS — I442 Atrioventricular block, complete: Secondary | ICD-10-CM

## 2013-08-25 NOTE — Patient Instructions (Signed)
Your physician recommends that you schedule a follow-up appointment in: in 6 months with [name]  Your physician recommends that you schedule a follow-up appointment in: 12 months with Dr. Johney Frame.

## 2013-08-25 NOTE — Progress Notes (Signed)
PCP: Evette Georges, MD  Lauren Buchanan is a 79 y.o. female who presents today for routine electrophysiology followup.  Since last being seen in our clinic, the patient reports doing very well.  Today, she denies symptoms of palpitations, chest pain, shortness of breath,  lower extremity edema, dizziness, presyncope, or syncope.  The patient is otherwise without complaint today.   Past Medical History  Diagnosis Date  . DJD (degenerative joint disease)   . IBS (irritable bowel syndrome)   . Goiter   . Complete heart block 1999    s/p PPM by Dr Juanda Chance, most recent generator change 2009  . Renal cyst   . COPD (chronic obstructive pulmonary disease)   . Headache(784.0)   . Pacemaker   . Wears glasses   . Hiatal hernia   . Esophageal stricture   . Diverticulosis   . Asthma   . Pneumonia   . Giant cell arteritis    Past Surgical History  Procedure Laterality Date  . Pacemaker insertion  1999    Gen change ([facility]) by Dr Juanda Chance 2009  . Cholecystectomy    . Left lower lobectomy for brochiectasis  1950  . Hemorrhoidectomy    . Renal cyst excision    . Tonsillectomy    . Fracture surgery      fx lt wrist  . Colonoscopy    . Artery biopsy Left 01/03/2013    Procedure: BIOPSY LEFT TEMPORAL ARTERY;  Surgeon: Darletta Moll, MD;  Location: [facility];  Service: ENT;  Laterality: Left;    Current Outpatient Prescriptions  Medication Sig Dispense Refill  . albuterol (PROVENTIL HFA;VENTOLIN HFA) 108 (90 BASE) MCG/ACT inhaler Inhale 2 puffs into the lungs every 6 (six) hours as needed for shortness of breath.      Marland Kitchen alendronate (FOSAMAX) 70 MG tablet Take 70 mg by mouth every Thursday. Take with a full glass of water on an empty stomach.      Marland Kitchen aspirin EC 81 MG tablet Take 81 mg by mouth daily.      . Calcium Citrate-Vitamin D (CITRACAL PETITES/VITAMIN D) 200-250 MG-UNIT TABS Take 1 tablet by mouth daily.        . diphenoxylate-atropine (LOMOTIL) 2.5-0.025 MG per tablet Take  1 tablet by mouth 3 (three) times daily. For 3 days, then as needed for diarrhea  20 tablet  0  . GLUCOSAMINE PO Take 1 tablet by mouth daily.        Marland Kitchen HYDROcodone-acetaminophen (NORCO/VICODIN) 5-325 MG per tablet Take 1 tablet by mouth every 4 (four) hours as needed for pain.  20 tablet  0  . hyoscyamine (LEVSIN SL) 0.125 MG SL tablet Place 1 tablet (0.125 mg total) under the tongue every 4 (four) hours as needed.  50 tablet  4  . metoprolol succinate (TOPROL-XL) 100 MG 24 hr tablet Half tab daily  45 tablet  3  . mometasone-formoterol (DULERA) 100-5 MCG/ACT AERO Inhale 2 puffs into the lungs daily as needed for shortness of breath.       Marland Kitchen omeprazole (PRILOSEC OTC) 20 MG tablet Take 20 mg by mouth daily.      Marland Kitchen saccharomyces boulardii (FLORASTOR) 250 MG capsule Take 1 capsule (250 mg total) by mouth 2 (two) times daily.  6 capsule  0  . vitamin B-12 (CYANOCOBALAMIN) 500 MCG tablet Take 500 mcg by mouth daily.        . vitamin C (ASCORBIC ACID) 500 MG tablet Take 500 mg by mouth daily.        . [  DISCONTINUED] loratadine (CLARITIN) 10 MG tablet Take 10 mg by mouth daily.         No current facility-administered medications for this visit.    Physical Exam: Filed Vitals:   08/25/13 1212  BP: 112/64  Pulse: 78  Height: 4\' 11"  (1.499 m)  Weight: 122 lb 1.9 oz (55.393 kg)    GEN- The patient is well appearing, alert and oriented x 3 today.   Head- normocephalic, atraumatic Eyes-  Sclera clear, conjunctiva pink Ears- hearing intact Oropharynx- clear Lungs- Clear to ausculation bilaterally, normal work of breathing Chest- pacemaker pocket is well healed Heart- Regular rate and rhythm, no murmurs, rubs or gallops, PMI not laterally displaced GI- soft, NT, ND, + BS Extremities- no clubbing, cyanosis, or edema  Pacemaker interrogation- reviewed in detail today,  See PACEART report  Assessment and Plan:  1. Complete heart block Normal pacemaker function See Pace Art report No changes  today  Return to see [name] in 6 months  I will see in a year

## 2013-09-06 ENCOUNTER — Encounter: Payer: Self-pay | Admitting: Internal Medicine

## 2013-09-06 DIAGNOSIS — I441 Atrioventricular block, second degree: Secondary | ICD-10-CM

## 2013-10-10 ENCOUNTER — Other Ambulatory Visit: Payer: Self-pay | Admitting: Family Medicine

## 2013-10-17 ENCOUNTER — Ambulatory Visit (INDEPENDENT_AMBULATORY_CARE_PROVIDER_SITE_OTHER): Payer: Medicare Other | Admitting: Internal Medicine

## 2013-10-17 ENCOUNTER — Telehealth: Payer: Self-pay | Admitting: Pulmonary Disease

## 2013-10-17 ENCOUNTER — Encounter: Payer: Self-pay | Admitting: Internal Medicine

## 2013-10-17 VITALS — BP 122/70 | HR 78 | Temp 98.0°F | Ht 60.5 in | Wt 120.4 lb

## 2013-10-17 DIAGNOSIS — J471 Bronchiectasis with (acute) exacerbation: Secondary | ICD-10-CM

## 2013-10-17 MED ORDER — MOMETASONE FURO-FORMOTEROL FUM 100-5 MCG/ACT IN AERO: INHALATION_SPRAY | RESPIRATORY_TRACT | Status: DC

## 2013-10-17 MED ORDER — MOMETASONE FURO-FORMOTEROL FUM 100-5 MCG/ACT IN AERO: 2.0000 | INHALATION_SPRAY | Freq: Every day | RESPIRATORY_TRACT | Status: DC | PRN

## 2013-10-17 MED ORDER — PREDNISONE 10 MG PO TABS: ORAL_TABLET | ORAL | Status: DC

## 2013-10-17 MED ORDER — LEVOFLOXACIN 500 MG PO TABS: 500.0000 mg | ORAL_TABLET | Freq: Every day | ORAL | Status: DC

## 2013-10-17 MED ORDER — HYDROCODONE-ACETAMINOPHEN 5-325 MG PO TABS: 1.0000 | ORAL_TABLET | ORAL | Status: DC | PRN

## 2013-10-17 MED ORDER — ALBUTEROL SULFATE HFA 108 (90 BASE) MCG/ACT IN AERS: 2.0000 | INHALATION_SPRAY | Freq: Four times a day (QID) | RESPIRATORY_TRACT | Status: DC | PRN

## 2013-10-17 NOTE — Patient Instructions (Addendum)
Prednisone 10 mg take  4 each am x 2 days,   2 each am x 2 days,  1 each am x 2 days and stop  Levaquin 500 mg daily x 7 days - call if get worse aches in ankles than you have now  For pain take vicodin one every 4 hours as needed  Resume dulera 100 Take 2 puffs first thing in am and then another 2 puffs about 12 hours later.    Work on inhaler technique:  relax and gently blow all the way out then take a nice smooth deep breath back in, triggering the inhaler at same time you start breathing in.  Hold for up to 5 seconds if you can.  Rinse and gargle with water when done  Follow up with Dr Shelle Iron in 1-2 weeks if not getting a lot better

## 2013-10-17 NOTE — Telephone Encounter (Signed)
Spoke with Cicero Duck. Pt is going to be here at 1:15 to see MW this afternoon. Nothing further needed. Cicero Duck will send CD ROM with pt.

## 2013-10-17 NOTE — Progress Notes (Signed)
Subjective:     Patient ID: Lauren Buchanan, female   DOB: [birth year]   MRN: [id number]  HPI  52 yowf with bronchiectasis feeling fine on dulera 100 2bid prn takes most of the time but ran out 10/16/13 Chief Complaint  Patient presents with  . Acute Visit    Cough with thick green/yellow mucus since yesterday.  Denies f/c/s or sob  at baseline not using saba but when stopped dulera marked increase saba need.  No obvious patterns in  day to day or daytime variabilty or assoc   cp or chest tightness, subjective wheeze overt sinus or hb symptoms. No unusual exp hx or h/o childhood pna/ asthma or knowledge of premature birth.  Sleeping ok without nocturnal  or early am exacerbation  of respiratory  c/o's or need for noct saba. Also denies any obvious fluctuation of symptoms with weather or environmental changes or other aggravating or alleviating factors except as outlined above   Current Medications, Allergies, Complete Past Medical History, Past Surgical History, Family History, and Social History were reviewed in [employer] record.  ROS  The following are not active complaints unless bolded sore throat, dysphagia, dental problems, itching, sneezing,  nasal congestion or excess/ purulent secretions, ear ache,   fever, chills, sweats, unintended wt loss, pleuritic or exertional cp, hemoptysis,  orthopnea pnd or leg swelling, presyncope, palpitations, heartburn, abdominal pain, anorexia, nausea, vomiting, diarrhea  or change in bowel or urinary habits, change in stools or urine, dysuria,hematuria,  rash, arthralgias, visual complaints, headache, numbness weakness or ataxia or problems with walking or coordination,  change in mood/affect or memory.      Review of Systems     Objective:   Physical Exam    amb wf nad not good with details of hx   Wt Readings from Last 3 Encounters:  10/17/13 120 lb 6.4 oz (54.613 kg)  08/25/13 122 lb 1.9 oz (55.393 kg)  07/18/13 123  lb 6.4 oz (55.974 kg)      HEENT: nl dentition, turbinates, and orophanx. Nl external ear canals without cough reflex   NECK :  without JVD/Nodes/TM/ nl carotid upstrokes bilaterally   LUNGS: no acc muscle use, a few exp rhonchi bilaterally    CV:  RRR  no s3 or murmur or increase in P2, no edema   ABD:  soft and nontender with nl excursion in the supine position. No bruits or organomegaly, bowel sounds nl  MS:  warm without deformities, calf tenderness, cyanosis or clubbing  SKIN: warm and dry without lesions    NEURO:  alert, approp, no deficits     Assessment:

## 2013-10-19 NOTE — Assessment & Plan Note (Addendum)
DDX of  difficult airways managment all start with A and  include Adherence, Ace Inhibitors, Acid Reflux, Active Sinus Disease, Alpha 1 Antitripsin deficiency, Anxiety masquerading as Airways dz,  ABPA,  allergy(esp in young), Aspiration (esp in elderly), Adverse effects of DPI,  Active smokers, plus one B = Beta blocker use..and one C= CHF  Adherence is always the initial "prime suspect" and is a multilayered concern that requires a "trust but verify" approach in every patient - starting with knowing how to use medications, especially inhalers, correctly, keeping up with refills and understanding the fundamental difference between maintenance and prns vs those medications only taken for a very short course and then stopped and not refilled. The proper method of use, as well as anticipated side effects, of a metered-dose inhaler are discussed and demonstrated to the patient. Improved effectiveness after extensive coaching during this visit to a level of approximately  75% so continue dulera 100 2bid but keep working on compliance challenges   ? Acid (or non-acid) GERD > always difficult to exclude as up to 75% of pts in some series report no assoc GI/ Heartburn symptoms> rec continue max (24h)  acid suppression and diet restrictions/ reviewed and instructions given in writing.  Note also maintained on fosfamax, might consider change to IV yearly reclast.   For now rx acute exac with levaquin/ rx airways with 6 days pred and continue dulera

## 2013-10-25 ENCOUNTER — Telehealth: Payer: Self-pay | Admitting: Pulmonary Disease

## 2013-10-25 NOTE — Telephone Encounter (Signed)
I think she needs to have an evaluation of her sinuses.  Would order limited ct of sinuses, and will call her with results. Would also like to see if we can culture her mucus.  She may have resistant bugs that are not responding to the abx given.  See if she can come by and get a cup, give an early am specimen, keep in fridge, and bring to lab within a few hours after expectoration.

## 2013-10-25 NOTE — Telephone Encounter (Signed)
Called and spoke with Lauren Buchanan and she was seen by MW on 12/30 with the following recs:  Prednisone 10 mg take 4 each am x 2 days, 2 each am x 2 days, 1 each am x 2 days and stop  Levaquin 500 mg daily x 7 days - call if get worse aches in ankles than you have now  For pain take vicodin one every 4 hours as needed  Resume dulera 100 Take 2 puffs first thing in am and then another 2 puffs about 12 hours later   Lauren Buchanan stated that she finished the pred and levaquin 2 days ago and stated that she is not any better.  She still has the cough some, head congestion and she stated that she has a hard time hearing, clear nasal congestion, denies any fever but does have the body aches.  Lauren Buchanan is requesting further recs from [facility].  Please advise.  Thanks  Allergies  Allergen Reactions  . Amoxicillin-Pot Clavulanate Other (See Comments)    Unknown      Current Outpatient Prescriptions on File Prior to Visit  Medication Sig Dispense Refill  . albuterol (PROVENTIL HFA;VENTOLIN HFA) 108 (90 BASE) MCG/ACT inhaler Inhale 2 puffs into the lungs every 6 (six) hours as needed for shortness of breath.  1 Inhaler  2  . alendronate (FOSAMAX) 70 MG tablet Take 70 mg by mouth every Thursday. Take with a full glass of water on an empty stomach.      Marland Kitchen aspirin EC 81 MG tablet Take 81 mg by mouth daily.      . Calcium Citrate-Vitamin D (CITRACAL PETITES/VITAMIN D) 200-250 MG-UNIT TABS Take 1 tablet by mouth daily.        Marland Kitchen GLUCOSAMINE PO Take 1 tablet by mouth daily.        Marland Kitchen HYDROcodone-acetaminophen (NORCO/VICODIN) 5-325 MG per tablet Take 1 tablet by mouth every 4 (four) hours as needed.  20 tablet  0  . levofloxacin (LEVAQUIN) 500 MG tablet Take 1 tablet (500 mg total) by mouth daily.  7 tablet  0  . metoprolol succinate (TOPROL-XL) 100 MG 24 hr tablet TAKE 1/2 (ONE-HALF) TABLET BY MOUTH EVERY MORNING  30 tablet  5  . mometasone-formoterol (DULERA) 100-5 MCG/ACT AERO Take 2 puffs first thing in am and then another 2 puffs about  12 hours later.  1 Inhaler  11  . omeprazole (PRILOSEC OTC) 20 MG tablet Take 20 mg by mouth daily.      . predniSONE (DELTASONE) 10 MG tablet Take  4 each am x 2 days,   2 each am x 2 days,  1 each am x 2 days and stop  20 tablet  0  . saccharomyces boulardii (FLORASTOR) 250 MG capsule Take 1 capsule (250 mg total) by mouth 2 (two) times daily.  6 capsule  0  . vitamin B-12 (CYANOCOBALAMIN) 500 MCG tablet Take 500 mcg by mouth daily.        . vitamin C (ASCORBIC ACID) 500 MG tablet Take 500 mg by mouth daily.        . [DISCONTINUED] loratadine (CLARITIN) 10 MG tablet Take 10 mg by mouth daily.         No current facility-administered medications on file prior to visit.

## 2013-10-25 NOTE — Telephone Encounter (Signed)
I spoke with pt. She wants to wait and see how she feels tomorrow. She reports she will call back if she decides to have this done. Nothing further needed

## 2013-11-13 ENCOUNTER — Ambulatory Visit: Payer: Medicare Other | Admitting: Internal Medicine

## 2013-11-15 ENCOUNTER — Telehealth: Payer: Self-pay | Admitting: Family Medicine

## 2013-11-15 NOTE — Telephone Encounter (Signed)
Patient Information:  Caller Name: Nyonna  Phone: [phone]  Patient: Lauren Buchanan, Lauren Buchanan  Gender: Female  DOB: 05-17-1925  Age: 80 Years  PCP: Stevie Kern [facility])  Office Follow Up:  Does the office need to follow up with this patient?: No  Instructions For The Office: N/A   Symptoms  Reason For Call & Symptoms: F/U call as pt requested. Pt has already spoken with CMA in office and has an appt for 11/16/13. Denies any distress or SOB at this time. Not at home.   Reviewed Health History In EMR: N/A  Reviewed Medications In EMR: N/A  Reviewed Allergies In EMR: N/A  Reviewed Surgeries / Procedures: N/A  Date of Onset of Symptoms: 11/08/2013  Guideline(s) Used:  No Protocol Available - Information Only  Disposition Per Guideline:   Home Care  Reason For Disposition Reached:   Information only question and nurse able to answer  Advice Given:  Call Back If:  New symptoms develop  You become worse.  Patient Will Follow Care Advice:  YES

## 2013-11-15 NOTE — Telephone Encounter (Signed)
Connected with pt regarding "change in voice after starting RA med". Pt was on the way out the door and asked for an RN call back after 1400. F/U call set up for 1415, 11/15/13. Pt did not mention SOB and was not in note sent to triage RN.

## 2013-11-15 NOTE — Telephone Encounter (Signed)
Spoke with patient.  She had started a new medication which caused SOB.  She called Dr Ouida Sills and he told her to call her PCP.   She changed her medication yesterday and took all 4 pills at one time and is feeling better today.  Appointment made for tomorrow with Dr Sherren Mocha.  Patient will call back tomorrow if appointment not needed.

## 2013-11-15 NOTE — Telephone Encounter (Signed)
Pt was given methotrexate sodium 2.5 mg by [facility]. Pt instructed to take 4 at nite,once a week/ bedtime.  This med has made pt sob and affecting her voice.  Pt called their office and was advised to see pcp.  Transferred pt for SOB.

## 2013-11-16 ENCOUNTER — Ambulatory Visit: Payer: Medicare Other | Admitting: Family Medicine

## 2013-12-12 ENCOUNTER — Encounter: Payer: Self-pay | Admitting: Internal Medicine

## 2013-12-12 DIAGNOSIS — I441 Atrioventricular block, second degree: Secondary | ICD-10-CM

## 2013-12-21 ENCOUNTER — Telehealth: Payer: Self-pay | Admitting: Pulmonary Disease

## 2013-12-21 ENCOUNTER — Encounter: Payer: Self-pay | Admitting: Internal Medicine

## 2013-12-21 NOTE — Telephone Encounter (Signed)
Pt returned call.  I advised that we currently do not have any dulera samples.  Pt verbalized understanding.  Pt states this medication is very expensive so would like to see if we can provide an alternative.  Lauren Buchanan

## 2013-12-21 NOTE — Telephone Encounter (Signed)
Attempted to call pt. No answer. We do not have samples of Dulera at this time.

## 2013-12-21 NOTE — Telephone Encounter (Signed)
Dr Gwenette Greet please advise if there is any alternative for this medication for patient cannot afford this at this time. Patient okay with waiting until 12/22/13 for a response, aware that [facility] out of office [place] PM.

## 2013-12-22 NOTE — Telephone Encounter (Signed)
She can look at symbicort 160/4.5 or advair 250/50.

## 2013-12-22 NOTE — Telephone Encounter (Signed)
Pt would like to try Symbicort. I will leave samples at the front for pick up. Nothing further was needed.

## 2014-01-16 ENCOUNTER — Ambulatory Visit (INDEPENDENT_AMBULATORY_CARE_PROVIDER_SITE_OTHER): Payer: Medicare Other | Admitting: Pulmonary Disease

## 2014-01-16 ENCOUNTER — Encounter: Payer: Self-pay | Admitting: Pulmonary Disease

## 2014-01-16 ENCOUNTER — Telehealth: Payer: Self-pay | Admitting: Pulmonary Disease

## 2014-01-16 VITALS — BP 142/88 | HR 70 | Temp 97.8°F | Ht 61.0 in | Wt 127.4 lb

## 2014-01-16 DIAGNOSIS — J449 Chronic obstructive pulmonary disease, unspecified: Secondary | ICD-10-CM

## 2014-01-16 DIAGNOSIS — J4489 Other specified chronic obstructive pulmonary disease: Secondary | ICD-10-CM

## 2014-01-16 DIAGNOSIS — J479 Bronchiectasis, uncomplicated: Secondary | ICD-10-CM

## 2014-01-16 MED ORDER — BUDESONIDE-FORMOTEROL FUMARATE 160-4.5 MCG/ACT IN AERO: 2.0000 | INHALATION_SPRAY | Freq: Every day | RESPIRATORY_TRACT | Status: DC

## 2014-01-16 NOTE — Telephone Encounter (Signed)
Duplicate message see prior phone note

## 2014-01-16 NOTE — Telephone Encounter (Signed)
I called and spoke with jackie from [employer]. She reports they have coupon for advair for pt and also this is cheaper for pt. Please advise KC thanks  Allergies  Allergen Reactions  . Amoxicillin-Pot Clavulanate Other (See Comments)    Unknown

## 2014-01-16 NOTE — Telephone Encounter (Signed)
Called pharm- they are closed until 2:30 [facility]

## 2014-01-16 NOTE — Telephone Encounter (Signed)
Lauren Buchanan called back. She has a coupon for Advair,

## 2014-01-16 NOTE — Progress Notes (Signed)
   Subjective:    Patient ID: Lauren Buchanan, female    DOB: July 12, 1925, 80 y.o.   MRN: [id number]  HPI Patient comes in today for followup of her known bronchiectasis with COPD. She did have an acute exacerbation in December of last year after running out of her maintenance inhaler. She was treated with antibiotics, course of prednisone, and ultimately put on Symbicort in the place of dulera because of expense. She feels that she has done well since that time, and denies any significant cough, congestion, or purulence. She feels her breathing is at baseline.   Review of Systems  Constitutional: Negative for fever and unexpected weight change.  HENT: Negative for congestion, dental problem, ear pain, nosebleeds, postnasal drip, rhinorrhea, sinus pressure, sneezing, sore throat and trouble swallowing.        Allergies  Eyes: Negative for redness and itching.  Respiratory: Negative for cough, chest tightness, shortness of breath and wheezing.   Cardiovascular: Negative for palpitations and leg swelling.  Gastrointestinal: Negative for nausea and vomiting.  Genitourinary: Negative for dysuria.  Musculoskeletal: Negative for joint swelling.  Skin: Negative for rash.  Neurological: Negative for headaches.  Hematological: Does not bruise/bleed easily.  Psychiatric/Behavioral: Negative for dysphoric mood. The patient is not nervous/anxious.        Objective:   Physical Exam Well-developed female in no acute distress  Nose without purulence or discharge noted Neck without lymphadenopathy or thyromegaly Chest with a few basilar crackles, good airflow and no wheezing Exam with regular rate and rhythm Lower extremities with no edema, no cyanosis Alert and oriented, moves all 4 extremities.       Assessment & Plan:

## 2014-01-16 NOTE — Telephone Encounter (Signed)
Is the coupon a one time thing?  It may help now, but will it help her the rest of the year.  If she wishes to get on advair instead, ok to call in 250/50 one inhalation bid.  Rinse mouth well.  Stop symbicort.

## 2014-01-16 NOTE — Assessment & Plan Note (Signed)
The patient does well from a breathing standpoint as long as she stays on a LABA/ICS. I have asked her to continue on her Symbicort, and also work on some type of conditioning program.

## 2014-01-16 NOTE — Assessment & Plan Note (Signed)
Patient denies any chest congestion or purulent mucus.

## 2014-01-16 NOTE — Patient Instructions (Signed)
Will send in prescription for symbicort, but let us know if this is not the cheapest alternative. Stay as active as possible.  followup with me in 41mos.

## 2014-01-16 NOTE — Telephone Encounter (Signed)
I spoke with Kennyth Lose at [facility] and she states the coupon is for a one time free advair. The pt does not have any drug coverage at all. I [id number] with the pt to verify this information and see if she has applied for patient assistance before? Was she given a sample of symbicort to use in the meantime? Ferdinand Bing, CMA

## 2014-01-17 NOTE — Telephone Encounter (Signed)
Spoke with pt and verified that she does not have prescription drug coverage.  She states that she is using one sample of Symbicort now and still has one box left.  She states that she will use this and be looking into getting some sort of rx coverage and will notify us when she is almost out of Symbicort and decide if she wants to proceed with Advair and pt assistance or stay on Symbicort.  Also spoke with Janett Billow at [facility] and informed of this.

## 2014-01-18 ENCOUNTER — Telehealth: Payer: Self-pay | Admitting: Pulmonary Disease

## 2014-01-18 NOTE — Telephone Encounter (Signed)
Pt states that Symbicort is way to expensive and she needs an alternative called into her pharmacy. Pt states that she has plenty of samples left from [facility] and will continue to use those until we can get back with her with a response from [facility]. Pt aware that we are closed until Monday and that it will be Monday before we get back with her d/t Dr Gwenette Greet being out of office this afternoon. Offered to let Dr Annamaria Boots give suggestion but she wanted to wait until [facility] is back.   Will send to [facility] to advise.

## 2014-01-18 NOTE — Telephone Encounter (Signed)
Patient calling to give alternate number--[phone]

## 2014-01-19 NOTE — Telephone Encounter (Signed)
Would prefer dulera 100/5 as alternative, but can do advair 250/50 if that is the only other alternative.

## 2014-01-22 NOTE — Telephone Encounter (Signed)
Pt aware of recs. She reports once she needs RX she will call us. Nothing further needed

## 2014-01-26 ENCOUNTER — Encounter: Payer: Self-pay | Admitting: *Deleted

## 2014-02-08 ENCOUNTER — Ambulatory Visit (INDEPENDENT_AMBULATORY_CARE_PROVIDER_SITE_OTHER): Payer: Medicare Other | Admitting: *Deleted

## 2014-02-08 DIAGNOSIS — Z950 Presence of cardiac pacemaker: Secondary | ICD-10-CM

## 2014-02-08 DIAGNOSIS — I442 Atrioventricular block, complete: Secondary | ICD-10-CM

## 2014-02-08 LAB — MDC_IDC_ENUM_SESS_TYPE_INCLINIC
Battery Voltage: 2.78 V
Brady Statistic RA Percent Paced: 53 %
Brady Statistic RV Percent Paced: 99 %
Date Time Interrogation Session: 20150423104634
Lead Channel Impedance Value: 350 Ohm
Lead Channel Impedance Value: 377 Ohm
Lead Channel Sensing Intrinsic Amplitude: 0.75 mV
Lead Channel Setting Pacing Amplitude: 2 V
Lead Channel Setting Sensing Sensitivity: 4 mV
MDC IDC MSMT BATTERY IMPEDANCE: 1100 Ohm
MDC IDC MSMT LEADCHNL RA PACING THRESHOLD AMPLITUDE: 0.75 V
MDC IDC MSMT LEADCHNL RA PACING THRESHOLD PULSEWIDTH: 0.4 ms
MDC IDC MSMT LEADCHNL RV PACING THRESHOLD AMPLITUDE: 1 V
MDC IDC MSMT LEADCHNL RV PACING THRESHOLD PULSEWIDTH: 0.4 ms
MDC IDC PG SERIAL: 2233979
MDC IDC SET LEADCHNL RV PACING PULSEWIDTH: 0.4 ms

## 2014-02-08 NOTE — Progress Notes (Signed)
Pacemaker check in clinic. Normal device function. Thresholds, sensing, impedances consistent with previous measurements. Device programmed to maximize longevity. 321 mode switches--- <1%, AT/AF 1.1% + ASA 81mg , no EGMs. Device programmed at appropriate safety margins. Histogram distribution appropriate for patient activity level. Device programmed to optimize intrinsic conduction. Estimated longevity 5.75-7.2.7yrs. ROV w/ JA in 19mo.

## 2014-02-14 ENCOUNTER — Ambulatory Visit: Payer: Medicare Other | Admitting: Pulmonary Disease

## 2014-02-14 ENCOUNTER — Encounter: Payer: Self-pay | Admitting: Internal Medicine

## 2014-03-08 ENCOUNTER — Encounter: Payer: Self-pay | Admitting: Nurse Practitioner

## 2014-03-08 ENCOUNTER — Ambulatory Visit (INDEPENDENT_AMBULATORY_CARE_PROVIDER_SITE_OTHER): Payer: Medicare Other | Admitting: Nurse Practitioner

## 2014-03-08 VITALS — BP 150/90 | HR 76 | Ht 61.0 in | Wt 126.1 lb

## 2014-03-08 DIAGNOSIS — Z950 Presence of cardiac pacemaker: Secondary | ICD-10-CM

## 2014-03-08 DIAGNOSIS — IMO0001 Reserved for inherently not codable concepts without codable children: Secondary | ICD-10-CM

## 2014-03-08 DIAGNOSIS — R030 Elevated blood-pressure reading, without diagnosis of hypertension: Secondary | ICD-10-CM

## 2014-03-08 NOTE — Progress Notes (Addendum)
[name] Date of Birth: Jun 06, 1925 Medical Record #[id number]  History of Present Illness: Lauren Buchanan is seen back today for a 6 month check. Seen for Dr. Rayann Heman. She is an 80 year old female with PPM due to CHB back in 1999. Has giant cell arteritis, DJD, COPD and advanced age.   Last seen here in November - was doing ok.  Comes back today. Here alone. Doing ok with no complaints. No chest pain. Her breathing is stable. No syncope. She tries to be as active as possible. Sees Dr. Ouida Sills for rheumatology - he does her labs. Goes to the Urgent Care on [address] for any primary care needs - not seeing Dr. Sherren Mocha. Feels ok on her medicines. She is on chronic Prednisone.   Current Outpatient Prescriptions  Medication Sig Dispense Refill  . albuterol (PROVENTIL HFA;VENTOLIN HFA) 108 (90 BASE) MCG/ACT inhaler Inhale 2 puffs into the lungs every 6 (six) hours as needed for shortness of breath.  1 Inhaler  2  . alendronate (FOSAMAX) 5 MG tablet Take 5 mg by mouth daily before breakfast. Take with a full glass of water on an empty stomach.      Marland Kitchen aspirin EC 81 MG tablet Take 81 mg by mouth daily.      . budesonide-formoterol (SYMBICORT) 160-4.5 MCG/ACT inhaler Inhale 2 puffs into the lungs daily.  1 Inhaler  11  . Calcium Citrate-Vitamin D (CITRACAL PETITES/VITAMIN D) 200-250 MG-UNIT TABS Take 1 tablet by mouth daily.        Marland Kitchen GLUCOSAMINE PO Take 1 tablet by mouth daily.        Marland Kitchen HYDROcodone-acetaminophen (NORCO/VICODIN) 5-325 MG per tablet Take 1 tablet by mouth every 4 (four) hours as needed.  20 tablet  0  . metoprolol succinate (TOPROL-XL) 100 MG 24 hr tablet TAKE 1/2 (ONE-HALF) TABLET BY MOUTH EVERY MORNING  30 tablet  5  . omeprazole (PRILOSEC OTC) 20 MG tablet Take 20 mg by mouth daily.      . predniSONE (DELTASONE) 10 MG tablet Take 5 mg by mouth daily with breakfast.      . vitamin B-12 (CYANOCOBALAMIN) 500 MCG tablet Take 500 mcg by mouth daily.        . vitamin C (ASCORBIC ACID)  500 MG tablet Take 500 mg by mouth daily.        . [DISCONTINUED] loratadine (CLARITIN) 10 MG tablet Take 10 mg by mouth daily.         No current facility-administered medications for this visit.    Allergies  Allergen Reactions  . Amoxicillin-Pot Clavulanate Other (See Comments)    Unknown     Past Medical History  Diagnosis Date  . DJD (degenerative joint disease)   . IBS (irritable bowel syndrome)   . Goiter   . Complete heart block 1999    s/p PPM by Dr Olevia Perches, most recent generator change 2009  . Renal cyst   . COPD (chronic obstructive pulmonary disease)   . Headache(784.0)   . Pacemaker   . Wears glasses   . Hiatal hernia   . Esophageal stricture   . Diverticulosis   . Asthma   . Pneumonia   . Giant cell arteritis     Past Surgical History  Procedure Laterality Date  . Pacemaker insertion  1999    Gen change (SJM) by Dr Olevia Perches 2009  . Cholecystectomy    . Left lower lobectomy for brochiectasis  1950  . Hemorrhoidectomy    . Renal  cyst excision    . Tonsillectomy    . Fracture surgery      fx lt wrist  . Colonoscopy    . Artery biopsy Left 01/03/2013    Procedure: BIOPSY LEFT TEMPORAL ARTERY;  Surgeon: Ascencion Dike, MD;  Location: [facility];  Service: ENT;  Laterality: Left;    History  Smoking status  . Never Smoker   Smokeless tobacco  . Not on file    History  Alcohol Use No    Family History  Problem Relation Age of Onset  . Heart disease Mother   . Breast cancer Mother   . Stomach cancer Father     Review of Systems: The review of systems is per the HPI.  All other systems were reviewed and are negative.  Physical Exam: BP 150/90  Pulse 76  Ht 5\' 1"  (1.549 m)  Wt 126 lb 1.9 oz (57.208 kg)  BMI 23.84 kg/m2 Patient is very pleasant and in no acute distress. Skin is warm and dry. Color is normal.  HEENT is unremarkable. Normocephalic/atraumatic. PERRL. Sclera are nonicteric. Neck is supple. No masses. No JVD. Lungs are  clear. Cardiac exam shows a regular rate and rhythm. She has an outflow murmur. Abdomen is soft. Extremities are without edema. Gait and ROM are intact. No gross neurologic deficits noted.  LABORATORY DATA:  Lab Results  Component Value Date   WBC 9.5 06/01/2013   HGB 13.3 06/01/2013   HCT 40.7 06/01/2013   PLT 280.0 06/01/2013   GLUCOSE 76 03/03/2013   CHOL 194 07/25/2007   TRIG 69 07/25/2007   HDL 62.7 07/25/2007   LDLCALC 118* 07/25/2007   ALT 35 03/01/2013   AST 18 03/01/2013   NA 138 03/03/2013   K 4.1 03/03/2013   CL 104 03/03/2013   CREATININE 0.64 03/03/2013   BUN 10 03/03/2013   CO2 28 03/03/2013   TSH 0.33* 12/13/2012   INR 1.0 RATIO 09/03/2008    Assessment / Plan: 1. PPM - followed by Dr. Rayann Heman.   2. Elevated BP - repeat by me is 140/80 - she was rushing to get here today - she will monitor at home.   3. Advanced age  50. Murmur - probably has some degree of AS - no cardinal symptoms reported.   See her back in 6 months.   Patient is agreeable to this plan and will call if any problems develop in the interim.   Burtis Junes, RN, [facility] [address] [place], [place]  [address] [phone]

## 2014-03-08 NOTE — Patient Instructions (Signed)
I think you are doing well  Try to monitor your blood pressure at home  See Dr. Rayann Heman back in November  Call the [place] office at [phone] if you have any questions, problems or concerns.

## 2014-03-13 ENCOUNTER — Encounter: Payer: Self-pay | Admitting: Internal Medicine

## 2014-03-13 DIAGNOSIS — I441 Atrioventricular block, second degree: Secondary | ICD-10-CM

## 2014-06-12 DIAGNOSIS — I441 Atrioventricular block, second degree: Secondary | ICD-10-CM

## 2014-06-26 ENCOUNTER — Encounter: Payer: Self-pay | Admitting: Internal Medicine

## 2014-07-13 ENCOUNTER — Telehealth: Payer: Self-pay | Admitting: Pulmonary Disease

## 2014-07-13 MED ORDER — BUDESONIDE-FORMOTEROL FUMARATE 160-4.5 MCG/ACT IN AERO: 2.0000 | INHALATION_SPRAY | Freq: Two times a day (BID) | RESPIRATORY_TRACT | Status: DC

## 2014-07-13 NOTE — Telephone Encounter (Signed)
Called pt. She needed her symbicort called in. I have done so. Nothing further needed

## 2014-07-13 NOTE — Telephone Encounter (Signed)
Called and spoke with pts daughter and she stated that the pt does not have the medication coverage for her meds and they are going to have to pay $200 for the symbicort.  They want to know if there is something cheaper that the pt may try.  They are aware that [facility] is out of the office until Tuesday.  [place] please advise. Thanks  Allergies  Allergen Reactions  . Amoxicillin-Pot Clavulanate Other (See Comments)    Unknown     Current Outpatient Prescriptions on File Prior to Visit  Medication Sig Dispense Refill  . albuterol (PROVENTIL HFA;VENTOLIN HFA) 108 (90 BASE) MCG/ACT inhaler Inhale 2 puffs into the lungs every 6 (six) hours as needed for shortness of breath.  1 Inhaler  2  . alendronate (FOSAMAX) 5 MG tablet Take 5 mg by mouth daily before breakfast. Take with a full glass of water on an empty stomach.      Marland Kitchen aspirin EC 81 MG tablet Take 81 mg by mouth daily.      . budesonide-formoterol (SYMBICORT) 160-4.5 MCG/ACT inhaler Inhale 2 puffs into the lungs 2 (two) times daily.  1 Inhaler  11  . Calcium Citrate-Vitamin D (CITRACAL PETITES/VITAMIN D) 200-250 MG-UNIT TABS Take 1 tablet by mouth daily.        Marland Kitchen GLUCOSAMINE PO Take 1 tablet by mouth daily.        Marland Kitchen HYDROcodone-acetaminophen (NORCO/VICODIN) 5-325 MG per tablet Take 1 tablet by mouth every 4 (four) hours as needed.  20 tablet  0  . metoprolol succinate (TOPROL-XL) 100 MG 24 hr tablet TAKE 1/2 (ONE-HALF) TABLET BY MOUTH EVERY MORNING  30 tablet  5  . omeprazole (PRILOSEC OTC) 20 MG tablet Take 20 mg by mouth daily.      . predniSONE (DELTASONE) 10 MG tablet Take 5 mg by mouth daily with breakfast.      . vitamin B-12 (CYANOCOBALAMIN) 500 MCG tablet Take 500 mcg by mouth daily.        . vitamin C (ASCORBIC ACID) 500 MG tablet Take 500 mg by mouth daily.        . [DISCONTINUED] loratadine (CLARITIN) 10 MG tablet Take 10 mg by mouth daily.         No current facility-administered medications on file prior to visit.

## 2014-07-17 NOTE — Telephone Encounter (Signed)
She can apply for pt assistance program for symbicort/advair/breo/or dulera. Ok to give her one month while th paperwork is pending.

## 2014-07-18 ENCOUNTER — Ambulatory Visit: Payer: Medicare Other | Admitting: Pulmonary Disease

## 2014-07-18 MED ORDER — BUDESONIDE-FORMOTEROL FUMARATE 160-4.5 MCG/ACT IN AERO: 2.0000 | INHALATION_SPRAY | Freq: Two times a day (BID) | RESPIRATORY_TRACT | Status: DC

## 2014-07-18 NOTE — Telephone Encounter (Signed)
Pt daughter, Lauren Buchanan will come pick up Symbicort assistance paperwork and samples today. Samples and forms placed up front. Aware of how to fill out forms and to bring back so that we may complete our part, fax and keep copy in her chart.  Nothing further needed.  Will send to [facility] as FYI that the patient daughter will be bring patient assistance forms back in the next week.

## 2014-08-01 ENCOUNTER — Ambulatory Visit: Payer: Medicare Other | Admitting: Pulmonary Disease

## 2014-08-02 ENCOUNTER — Encounter: Payer: Self-pay | Admitting: Family Medicine

## 2014-08-02 ENCOUNTER — Ambulatory Visit (INDEPENDENT_AMBULATORY_CARE_PROVIDER_SITE_OTHER): Payer: Medicare Other | Admitting: Family Medicine

## 2014-08-02 VITALS — BP 110/80 | Temp 97.9°F | Wt 125.0 lb

## 2014-08-02 DIAGNOSIS — R197 Diarrhea, unspecified: Secondary | ICD-10-CM

## 2014-08-02 MED ORDER — HYDROCODONE-HOMATROPINE 5-1.5 MG/5ML PO SYRP: ORAL_SOLUTION | ORAL | Status: DC

## 2014-08-02 NOTE — Progress Notes (Signed)
   Subjective:    Patient ID: ARDICE BOYAN, female    DOB: 1925/08/29, 80 y.o.   MRN: [id number]  HPI Pa is an 80 year old widowed female nonsmoker who comes in with a three-day history of diarrhea  She's been having 4-6 loose bowel movements daily.  No fever chills nausea vomiting or urinary tract symptoms.  She's had no contact with anybody has been ill. She's not had any foreign travel. Medications unchanged   Review of Systems Review of systems otherwise negative    Objective:   Physical Exam  Well-developed well-nourished female no acute distress vital signs stable she is afebrile examination the abdomen appears normal. Are some high-pitched bowel sounds but no tenderness no masses and no rebound      Assessment & Plan:  Viral diarrhea........... clear liquids,,,,, Imodium,,,,, return when necessary.

## 2014-08-02 NOTE — Progress Notes (Signed)
Pre visit review using our clinic review tool, if applicable. No additional management support is needed unless otherwise documented below in the visit note. 

## 2014-08-02 NOTE — Patient Instructions (Signed)
Hydromet............Marland Kitchen 1/2 teaspoon 3 times daily  Clear liquid diet........Marland Kitchen ginger ale...Marland KitchenMarland KitchenMarland Kitchen Water......... 7-Up........ Gatorade.............Marland Kitchen

## 2014-08-08 ENCOUNTER — Other Ambulatory Visit: Payer: Self-pay | Admitting: Internal Medicine

## 2014-08-08 DIAGNOSIS — M25552 Pain in left hip: Secondary | ICD-10-CM

## 2014-08-09 ENCOUNTER — Telehealth: Payer: Self-pay | Admitting: Internal Medicine

## 2014-08-09 MED ORDER — BUDESONIDE-FORMOTEROL FUMARATE 160-4.5 MCG/ACT IN AERO: 2.0000 | INHALATION_SPRAY | Freq: Two times a day (BID) | RESPIRATORY_TRACT | Status: DC

## 2014-08-09 NOTE — Telephone Encounter (Signed)
[place] pt not MR. Forms placed in [place] green folder for signature. Please return to [place] once done. thanks

## 2014-08-10 NOTE — Telephone Encounter (Signed)
done

## 2014-08-13 NOTE — Telephone Encounter (Signed)
Mindy, can this message be closed?

## 2014-08-17 ENCOUNTER — Other Ambulatory Visit: Payer: Self-pay | Admitting: Dermatology

## 2014-08-22 ENCOUNTER — Telehealth: Payer: Self-pay | Admitting: Gastroenterology

## 2014-08-22 NOTE — Telephone Encounter (Signed)
See if an APP has a sooner appt If not she can get a full GI pathogen panel and blood work ordered through one of the MDs she saw recently

## 2014-08-22 NOTE — Telephone Encounter (Signed)
Patient will come in and see Nicoletta Ba PA tomorrow at 10:30. She is notified of the appt date and time

## 2014-08-23 ENCOUNTER — Other Ambulatory Visit (INDEPENDENT_AMBULATORY_CARE_PROVIDER_SITE_OTHER): Payer: Medicare Other

## 2014-08-23 ENCOUNTER — Ambulatory Visit (INDEPENDENT_AMBULATORY_CARE_PROVIDER_SITE_OTHER): Payer: Medicare Other | Admitting: Physician Assistant

## 2014-08-23 ENCOUNTER — Encounter: Payer: Self-pay | Admitting: Physician Assistant

## 2014-08-23 VITALS — BP 150/80 | HR 88 | Ht 60.0 in | Wt 126.0 lb

## 2014-08-23 DIAGNOSIS — R1030 Lower abdominal pain, unspecified: Secondary | ICD-10-CM

## 2014-08-23 DIAGNOSIS — R197 Diarrhea, unspecified: Secondary | ICD-10-CM

## 2014-08-23 LAB — BASIC METABOLIC PANEL
BUN: 16 mg/dL (ref 6–23)
CALCIUM: 9.4 mg/dL (ref 8.4–10.5)
CO2: 32 meq/L (ref 19–32)
Chloride: 101 mEq/L (ref 96–112)
Creatinine, Ser: 0.7 mg/dL (ref 0.4–1.2)
GFR: 84.14 mL/min (ref >60.00)
GLUCOSE: 92 mg/dL (ref 70–99)
Potassium: 4.4 mEq/L (ref 3.5–5.1)
SODIUM: 137 meq/L (ref 135–145)

## 2014-08-23 LAB — CBC WITH DIFFERENTIAL/PLATELET
BASOS PCT: 0.3 % (ref 0.0–3.0)
Basophils Absolute: 0 10*3/uL (ref 0.0–0.1)
EOS PCT: 0.7 % (ref 0.0–5.0)
Eosinophils Absolute: 0.1 10*3/uL (ref 0.0–0.7)
HEMATOCRIT: 44.9 % (ref 36.0–46.0)
HEMOGLOBIN: 14.6 g/dL (ref 12.0–15.0)
LYMPHS ABS: 1.1 10*3/uL (ref 0.7–4.0)
Lymphocytes Relative: 12.2 % (ref 12.0–46.0)
MCHC: 32.5 g/dL (ref 30.0–36.0)
MCV: 90.1 fl (ref 78.0–100.0)
MONO ABS: 0.7 10*3/uL (ref 0.1–1.0)
MONOS PCT: 7.4 % (ref 3.0–12.0)
NEUTROS ABS: 7.4 10*3/uL (ref 1.4–7.7)
Neutrophils Relative %: 79.4 % — ABNORMAL HIGH (ref 43.0–77.0)
PLATELETS: 327 10*3/uL (ref 150.0–400.0)
RBC: 4.98 Mil/uL (ref 3.87–5.11)
RDW: 13.8 % (ref 11.5–15.5)
WBC: 9.3 10*3/uL (ref 4.0–10.5)

## 2014-08-23 LAB — C-REACTIVE PROTEIN: CRP: 0.6 mg/dL (ref 0.5–20.0)

## 2014-08-23 MED ORDER — METRONIDAZOLE 250 MG PO TABS: ORAL_TABLET | ORAL | Status: DC

## 2014-08-23 MED ORDER — SACCHAROMYCES BOULARDII 250 MG PO CAPS: 250.0000 mg | ORAL_CAPSULE | Freq: Two times a day (BID) | ORAL | Status: DC

## 2014-08-23 NOTE — Progress Notes (Signed)
Reviewed and agree with management plan.  Darrian Grzelak T. Petro Talent, MD FACG 

## 2014-08-23 NOTE — Patient Instructions (Signed)
Please go to the basement level to have your labs drawn and stool study. We sent prescriptions to [facility]. 1. Metronidazole ( Flagyl)  Take 1 tab 4 times daily with food for 10 days. 2. Florastor probiotic take 1 tab twice daily for 3 weeks.  After you have submitted the stool sample to our lab, you can get Imodium tablets for diarrhea and take it as needed.

## 2014-08-23 NOTE — Progress Notes (Signed)
Subjective:    Patient ID: Lauren Buchanan, female    DOB: Jun 09, 1925, 80 y.o.   MRN: [id number]  HPI  Lauren Buchanan is a pleasant 80 year old white female known to Dr. Fuller Plan. She has history of COPD, giant cell arteritis, history of IBS, AV block status post pacemaker ,and bronchiectasis. Last colonoscopy was done in 2002 and showed diverticulosis.Patient comes in today with complaints of diarrhea over the past couple of months. Her symptoms have been somewhat progressive and she is now having diarrhea on a daily basis with at least 2-3 loose bowel movements per day.she describes vague achy lower abdominal discomfort no real pain. Her appetite has been decreased though she does not feel she is lost any weight. She has not noticed any melena or hematochezia. Currently not having any nocturnal episodes of diarrhea and has not had any incontinence. She does have urgency No recent antibiotics or changes in medications. Not had any known infectious exposures recent travel etc. She complains of feeling generally fatigued   Review of Systems  Constitutional: Positive for appetite change and fatigue.  HENT: Negative.   Eyes: Negative.   Respiratory: Negative.   Cardiovascular: Negative.   Gastrointestinal: Positive for abdominal pain and diarrhea.  Endocrine: Negative.   Genitourinary: Negative.   Musculoskeletal: Negative.   Skin: Negative.   Allergic/Immunologic: Negative.   Neurological: Negative.   Hematological: Negative.   Psychiatric/Behavioral: Negative.    Outpatient Prescriptions Prior to Visit  Medication Sig Dispense Refill  . albuterol (PROVENTIL HFA;VENTOLIN HFA) 108 (90 BASE) MCG/ACT inhaler Inhale 2 puffs into the lungs every 6 (six) hours as needed for shortness of breath. 1 Inhaler 2  . alendronate (FOSAMAX) 5 MG tablet Take 5 mg by mouth daily before breakfast. Take with a full glass of water on an empty stomach.    . budesonide-formoterol (SYMBICORT) 160-4.5 MCG/ACT inhaler  Inhale 2 puffs into the lungs 2 (two) times daily. 3 Inhaler 3  . Calcium Citrate-Vitamin D (CITRACAL PETITES/VITAMIN D) 200-250 MG-UNIT TABS Take 1 tablet by mouth daily.      Marland Kitchen GLUCOSAMINE PO Take 1 tablet by mouth daily.      . metoprolol succinate (TOPROL-XL) 100 MG 24 hr tablet TAKE 1/2 (ONE-HALF) TABLET BY MOUTH EVERY MORNING 30 tablet 5  . omeprazole (PRILOSEC OTC) 20 MG tablet Take 20 mg by mouth daily.    . predniSONE (DELTASONE) 10 MG tablet Take 5 mg by mouth daily with breakfast.    . vitamin C (ASCORBIC ACID) 500 MG tablet Take 500 mg by mouth daily.      Marland Kitchen HYDROcodone-homatropine (HYCODAN) 5-1.5 MG/5ML syrup 1/2 teaspoon 3 times daily 120 mL 0  . aspirin EC 81 MG tablet Take 81 mg by mouth daily.    . vitamin B-12 (CYANOCOBALAMIN) 500 MCG tablet Take 500 mcg by mouth daily.       No facility-administered medications prior to visit.   Allergies  Allergen Reactions  . Amoxicillin-Pot Clavulanate Other (See Comments)    Unknown    Patient Active Problem List   Diagnosis Date Noted  . Diarrhea 08/02/2014  . Giant cell arteritis 06/02/2013  . Dehydration with hyponatremia 02/28/2013  . Transaminitis 02/28/2013  . 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 SIRS (systemic inflammatory response syndrome) 02/27/2013  . Healthcare-associated pneumonia 02/27/2013  . Temporal arteritis 02/27/2013  . Blurred vision, bilateral 12/13/2012  . Other malaise and fatigue 12/13/2012  . Bronchiectasis with acute exacerbation 03/30/2012  . Bronchiectasis without acute exacerbation  01/01/2012  . MEMORY LOSS 08/07/2010  . SVT/ PSVT/ PAT 09/20/2009  . COPD 08/21/2009  . PLANTAR FASCIITIS, BILATERAL 12/07/2008  . AV BLOCK, COMPLETE 09/07/2008  . PACEMAKER, PERMANENT 09/07/2008  . RHINITIS, ALLERGIC NEC 05/13/2007  . GOITER NOS 05/12/2007  . IBS 05/12/2007  . DEGENERATIVE JOINT DISEASE 05/12/2007   History  Substance Use Topics  . Smoking status: Never  Smoker   . Smokeless tobacco: Never Used  . Alcohol Use: No   family history includes Breast cancer in her mother; Heart disease in her mother; Stomach cancer in her father.     Objective:   Physical Exam   Well-developed elderly white female in no acute distress blood pressure 150/80 pulse 88 height 5 foot weight 126. HEENT; nontraumatic normocephalic EOMI PERRLA sclera anicteric, Supple; no JVD, Cardiovascular; regular rate and rhythm with S1-S2 no murmur or gallop,pulmonary clear bilaterally, Abdomen; soft nondistended bowel sounds are active she is minimally tender across the lower abdomen no guarding or rebound no palpable mass or hepatosplenomegaly she is a large cholecystectomy incisional scar, Rectal; exam not done, Ext; no clubbing cyanosis or edema skin warm and dry, Psych; mood and affect appropriate        Assessment & Plan:  #46  80 year old female with 2 month history of diarrhea and mild lower abdominal discomfort. This may be a manifestation of IBS though she has never had persistent diarrhea in the past, rule out infectious etiology IE C. Difficile ,giardiasis. #2 giant cell arteritis with chronic low-dose steroid use #3 status post pacemaker for heart block #4 COPD #5 bronchiectasis #6 diverticulosis Plan; We'll check CBC with differential, BMET and CRP GI pathogen panel Start empiric flora store one twice daily 3 weeks Start Flagyl 250 mg by mouth 4 times daily with food 10 days Patient may use one Imodium A-D each morning as needed B[place] diet Further plans pending response to above and results of labs

## 2014-08-24 ENCOUNTER — Telehealth: Payer: Self-pay | Admitting: Physician Assistant

## 2014-08-24 NOTE — Telephone Encounter (Signed)
Questioning the need for a c-diff test because she had one in August and it was negative.Ok to proceed?

## 2014-08-27 ENCOUNTER — Encounter: Payer: Self-pay | Admitting: Pulmonary Disease

## 2014-08-27 ENCOUNTER — Ambulatory Visit: Payer: Medicare Other | Admitting: *Deleted

## 2014-08-27 ENCOUNTER — Ambulatory Visit (INDEPENDENT_AMBULATORY_CARE_PROVIDER_SITE_OTHER): Payer: Medicare Other | Admitting: Pulmonary Disease

## 2014-08-27 VITALS — BP 138/90 | HR 82 | Temp 97.4°F | Ht 60.0 in | Wt 128.8 lb

## 2014-08-27 DIAGNOSIS — J479 Bronchiectasis, uncomplicated: Secondary | ICD-10-CM

## 2014-08-27 DIAGNOSIS — Z23 Encounter for immunization: Secondary | ICD-10-CM

## 2014-08-27 DIAGNOSIS — J449 Chronic obstructive pulmonary disease, unspecified: Secondary | ICD-10-CM

## 2014-08-27 NOTE — Progress Notes (Signed)
   Subjective:    Patient ID: Lauren Buchanan, female    DOB: 02-Jul-1925, 80 y.o.   MRN: [id number]  HPI The patient comes in today for follow-up of her known bronchiectasis with COPD. She has been staying compliant on Symbicort since the last visit, and denies any recent acute exacerbation. Her cough and mucus production is at baseline, and she has not had increased quantity or change in character. Her only new complaint is that of stomach cramping with diarrhea, and this is being evaluated.   Review of Systems  Constitutional: Negative for fever and unexpected weight change.  HENT: Positive for congestion and postnasal drip. Negative for dental problem, ear pain, nosebleeds, rhinorrhea, sinus pressure, sneezing, sore throat and trouble swallowing.   Eyes: Negative for redness and itching.  Respiratory: Positive for cough, chest tightness and shortness of breath. Negative for wheezing.   Cardiovascular: Negative for palpitations and leg swelling.  Gastrointestinal: Negative for nausea and vomiting.  Genitourinary: Negative for dysuria.  Musculoskeletal: Negative for joint swelling.  Skin: Negative for rash.  Neurological: Negative for headaches.  Hematological: Does not bruise/bleed easily.  Psychiatric/Behavioral: Negative for dysphoric mood. The patient is not nervous/anxious.        Objective:   Physical Exam Well-developed female in no acute distress Nose without purulence or discharge noted Neck without lymphadenopathy or thyromegaly Chest with adequate airflow, no wheezing, an occasional crackle noted Cardiac exam with regular rate and rhythm, 2/6 systolic murmur Lower extremities with minimal edema, no cyanosis Alert and oriented, moves all 4 extremities.        Assessment & Plan:

## 2014-08-27 NOTE — Patient Instructions (Signed)
Continue on symbicort, with albuterol as needed. Will give you a flu shot today. followup with me again in 39mos.

## 2014-08-27 NOTE — Telephone Encounter (Signed)
Yes...unless she is a lot better she needs to go ahead

## 2014-08-27 NOTE — Assessment & Plan Note (Signed)
The patient has underlying airflow obstruction related to her underlying bronchiectasis, and is maintaining a stable baseline on Symbicort. She has not had a recent acute exacerbation, as long as she stays on her maintenance inhaler.

## 2014-08-27 NOTE — Assessment & Plan Note (Signed)
The patient denies any increased congestion or mucus production over her usual baseline.

## 2014-08-27 NOTE — Telephone Encounter (Signed)
Explained to patient. She states she is not any better and she will given specimen.

## 2014-08-29 ENCOUNTER — Other Ambulatory Visit: Payer: Self-pay | Admitting: Rheumatology

## 2014-08-29 ENCOUNTER — Other Ambulatory Visit: Payer: Medicare Other

## 2014-08-29 ENCOUNTER — Encounter: Payer: Self-pay | Admitting: *Deleted

## 2014-08-29 DIAGNOSIS — R197 Diarrhea, unspecified: Secondary | ICD-10-CM

## 2014-08-29 DIAGNOSIS — M25552 Pain in left hip: Secondary | ICD-10-CM

## 2014-08-29 DIAGNOSIS — R1030 Lower abdominal pain, unspecified: Secondary | ICD-10-CM

## 2014-08-30 ENCOUNTER — Telehealth: Payer: Self-pay | Admitting: Physician Assistant

## 2014-08-30 LAB — GASTROINTESTINAL PATHOGEN PANEL PCR
C. DIFFICILE TOX A/B, PCR: NEGATIVE
Campylobacter, PCR: NEGATIVE
Cryptosporidium, PCR: NEGATIVE
E COLI (STEC) STX1/STX2, PCR: NEGATIVE
E coli (ETEC) LT/ST PCR: NEGATIVE
E coli 0157, PCR: NEGATIVE
GIARDIA LAMBLIA, PCR: NEGATIVE
Norovirus, PCR: NEGATIVE
ROTAVIRUS, PCR: NEGATIVE
SALMONELLA, PCR: NEGATIVE
SHIGELLA, PCR: NEGATIVE

## 2014-08-30 NOTE — Telephone Encounter (Signed)
Advised patient the test is not resulted yet

## 2014-08-31 ENCOUNTER — Other Ambulatory Visit: Payer: Self-pay

## 2014-08-31 MED ORDER — SACCHAROMYCES BOULARDII 250 MG PO CAPS: ORAL_CAPSULE | ORAL | Status: DC

## 2014-09-03 ENCOUNTER — Other Ambulatory Visit: Payer: Medicare Other

## 2014-09-06 ENCOUNTER — Ambulatory Visit
Admission: RE | Admit: 2014-09-06 | Discharge: 2014-09-06 | Disposition: A | Discharge status: Home / Self Care | Payer: Medicare Other | Source: Ambulatory Visit | Attending: Rheumatology | Admitting: Rheumatology

## 2014-09-06 ENCOUNTER — Other Ambulatory Visit: Payer: Medicare Other

## 2014-09-06 DIAGNOSIS — M25552 Pain in left hip: Secondary | ICD-10-CM

## 2014-09-11 ENCOUNTER — Encounter: Payer: Self-pay | Admitting: Internal Medicine

## 2014-09-11 DIAGNOSIS — I441 Atrioventricular block, second degree: Secondary | ICD-10-CM

## 2014-09-21 ENCOUNTER — Other Ambulatory Visit: Payer: Self-pay | Admitting: Family Medicine

## 2014-09-27 ENCOUNTER — Encounter (HOSPITAL_COMMUNITY): Payer: Self-pay | Admitting: Cardiology

## 2014-09-27 ENCOUNTER — Emergency Department (HOSPITAL_COMMUNITY): Payer: Medicare Other

## 2014-09-27 ENCOUNTER — Emergency Department (HOSPITAL_COMMUNITY)
Admission: EM | Admit: 2014-09-27 | Discharge: 2014-09-27 | Disposition: A | Discharge status: Home / Self Care | Payer: Medicare Other | Attending: Emergency Medicine | Admitting: Emergency Medicine

## 2014-09-27 DIAGNOSIS — Z9049 Acquired absence of other specified parts of digestive tract: Secondary | ICD-10-CM | POA: Diagnosis not present

## 2014-09-27 DIAGNOSIS — R197 Diarrhea, unspecified: Secondary | ICD-10-CM | POA: Insufficient documentation

## 2014-09-27 DIAGNOSIS — Z8639 Personal history of other endocrine, nutritional and metabolic disease: Secondary | ICD-10-CM | POA: Insufficient documentation

## 2014-09-27 DIAGNOSIS — Z8701 Personal history of pneumonia (recurrent): Secondary | ICD-10-CM | POA: Insufficient documentation

## 2014-09-27 DIAGNOSIS — Z7951 Long term (current) use of inhaled steroids: Secondary | ICD-10-CM | POA: Insufficient documentation

## 2014-09-27 DIAGNOSIS — Z8679 Personal history of other diseases of the circulatory system: Secondary | ICD-10-CM | POA: Diagnosis not present

## 2014-09-27 DIAGNOSIS — J449 Chronic obstructive pulmonary disease, unspecified: Secondary | ICD-10-CM | POA: Insufficient documentation

## 2014-09-27 DIAGNOSIS — Z7952 Long term (current) use of systemic steroids: Secondary | ICD-10-CM | POA: Insufficient documentation

## 2014-09-27 DIAGNOSIS — K5790 Diverticulosis of intestine, part unspecified, without perforation or abscess without bleeding: Secondary | ICD-10-CM | POA: Insufficient documentation

## 2014-09-27 DIAGNOSIS — R630 Anorexia: Secondary | ICD-10-CM | POA: Diagnosis not present

## 2014-09-27 DIAGNOSIS — Z880 Allergy status to penicillin: Secondary | ICD-10-CM | POA: Diagnosis not present

## 2014-09-27 DIAGNOSIS — Z950 Presence of cardiac pacemaker: Secondary | ICD-10-CM | POA: Diagnosis not present

## 2014-09-27 DIAGNOSIS — R110 Nausea: Secondary | ICD-10-CM | POA: Insufficient documentation

## 2014-09-27 DIAGNOSIS — R109 Unspecified abdominal pain: Secondary | ICD-10-CM

## 2014-09-27 DIAGNOSIS — Z79899 Other long term (current) drug therapy: Secondary | ICD-10-CM | POA: Diagnosis not present

## 2014-09-27 DIAGNOSIS — R1032 Left lower quadrant pain: Secondary | ICD-10-CM | POA: Diagnosis present

## 2014-09-27 DIAGNOSIS — R1031 Right lower quadrant pain: Secondary | ICD-10-CM | POA: Insufficient documentation

## 2014-09-27 DIAGNOSIS — Z973 Presence of spectacles and contact lenses: Secondary | ICD-10-CM | POA: Insufficient documentation

## 2014-09-27 LAB — COMPREHENSIVE METABOLIC PANEL
ALBUMIN: 3.7 g/dL (ref 3.5–5.2)
ALT: 27 U/L (ref 0–35)
ANION GAP: 10 (ref 5–15)
AST: 30 U/L (ref 0–37)
Alkaline Phosphatase: 66 U/L (ref 39–117)
BILIRUBIN TOTAL: 0.5 mg/dL (ref 0.3–1.2)
BUN: 9 mg/dL (ref 6–23)
CHLORIDE: 98 meq/L (ref 96–112)
CO2: 30 mEq/L (ref 19–32)
CREATININE: 0.62 mg/dL (ref 0.50–1.10)
Calcium: 9.7 mg/dL (ref 8.4–10.5)
GFR calc Af Amer: >90 mL/min (ref >90)
GFR calc non Af Amer: 79 mL/min — ABNORMAL LOW (ref >90)
Glucose, Bld: 199 mg/dL — ABNORMAL HIGH (ref 70–99)
Potassium: 4.6 mEq/L (ref 3.7–5.3)
Sodium: 138 mEq/L (ref 137–147)
TOTAL PROTEIN: 6.9 g/dL (ref 6.0–8.3)

## 2014-09-27 LAB — URINALYSIS, ROUTINE W REFLEX MICROSCOPIC
Bilirubin Urine: NEGATIVE
Glucose, UA: NEGATIVE mg/dL
Hgb urine dipstick: NEGATIVE
Ketones, ur: NEGATIVE mg/dL
NITRITE: NEGATIVE
Protein, ur: NEGATIVE mg/dL
SPECIFIC GRAVITY, URINE: 1.005 (ref 1.005–1.030)
UROBILINOGEN UA: 0.2 mg/dL (ref 0.0–1.0)
pH: 7 (ref 5.0–8.0)

## 2014-09-27 LAB — URINE MICROSCOPIC-ADD ON

## 2014-09-27 LAB — CBC WITH DIFFERENTIAL/PLATELET
BASOS ABS: 0 10*3/uL (ref 0.0–0.1)
BASOS PCT: 0 % (ref 0–1)
EOS ABS: 0.1 10*3/uL (ref 0.0–0.7)
Eosinophils Relative: 1 % (ref 0–5)
HCT: 43.4 % (ref 36.0–46.0)
Hemoglobin: 14.1 g/dL (ref 12.0–15.0)
Lymphocytes Relative: 8 % — ABNORMAL LOW (ref 12–46)
Lymphs Abs: 0.8 10*3/uL (ref 0.7–4.0)
MCH: 29.2 pg (ref 26.0–34.0)
MCHC: 32.5 g/dL (ref 30.0–36.0)
MCV: 89.9 fL (ref 78.0–100.0)
MONO ABS: 0.6 10*3/uL (ref 0.1–1.0)
Monocytes Relative: 6 % (ref 3–12)
NEUTROS PCT: 85 % — AB (ref 43–77)
Neutro Abs: 8.5 10*3/uL — ABNORMAL HIGH (ref 1.7–7.7)
Platelets: 286 10*3/uL (ref 150–400)
RBC: 4.83 MIL/uL (ref 3.87–5.11)
RDW: 14.3 % (ref 11.5–15.5)
WBC: 9.9 10*3/uL (ref 4.0–10.5)

## 2014-09-27 LAB — LIPASE, BLOOD: LIPASE: 32 U/L (ref 11–59)

## 2014-09-27 MED ORDER — IOHEXOL 300 MG/ML  SOLN
25.0000 mL | Freq: Once | INTRAMUSCULAR | Status: AC | PRN
  Administered 2014-09-27: 25 mL via ORAL

## 2014-09-27 MED ORDER — SODIUM CHLORIDE 0.9 % IV BOLUS (SEPSIS)
1000.0000 mL | Freq: Once | INTRAVENOUS | Status: AC
  Administered 2014-09-27: 1000 mL via INTRAVENOUS

## 2014-09-27 MED ORDER — IOHEXOL 300 MG/ML  SOLN
100.0000 mL | Freq: Once | INTRAMUSCULAR | Status: AC | PRN
  Administered 2014-09-27: 100 mL via INTRAVENOUS

## 2014-09-27 NOTE — ED Provider Notes (Signed)
CSN: [id number]     Arrival date & time 09/27/14  1537 History   First MD Initiated Contact with Patient 09/27/14 1711     Chief Complaint  Patient presents with  . Abdominal Pain     (Consider location/radiation/quality/duration/timing/severity/associated sxs/prior Treatment) Patient is a 80 y.o. female presenting with abdominal pain. The history is provided by the patient and a relative.  Abdominal Pain Pain location:  RLQ and LLQ Pain quality: bloating, cramping and gnawing   Pain radiates to:  Does not radiate Pain severity:  Mild Onset quality:  Gradual Duration: 2-3 months. Timing:  Sporadic Progression:  Worsening Chronicity:  New Context comment:  Started with abrupt diarrhea 3 months ago which is not improving.  saw PCP and GI and so far has only has GI pathogen panel and treatment with flagyl and florastor without improvement.  abdominal cramping is getting worse Relieved by:  Nothing Worsened by:  Eating Ineffective treatments: imodium. Associated symptoms: anorexia, diarrhea and nausea   Associated symptoms: no chest pain, no chills, no constipation, no cough, no dysuria, no fever and no vomiting   Risk factors: being elderly   Risk factors: has not had multiple surgeries, no NSAID use and no recent hospitalization     Past Medical History  Diagnosis Date  . DJD (degenerative joint disease)   . IBS (irritable bowel syndrome)   . Goiter   . Complete heart block 1999    s/p PPM by Dr Olevia Perches, most recent generator change 2009  . Renal cyst   . COPD (chronic obstructive pulmonary disease)   . Headache(784.0)   . Pacemaker   . Wears glasses   . Hiatal hernia   . Esophageal stricture   . Diverticulosis   . Asthma   . Pneumonia   . Giant cell arteritis    Past Surgical History  Procedure Laterality Date  . Pacemaker insertion  1999    Gen change (SJM) by Dr Olevia Perches 2009  . Cholecystectomy    . Left lower lobectomy for brochiectasis  1950  .  Hemorrhoidectomy    . Renal cyst excision    . Tonsillectomy    . Fracture surgery      fx lt wrist  . Colonoscopy    . Artery biopsy Left 01/03/2013    Procedure: BIOPSY LEFT TEMPORAL ARTERY;  Surgeon: Ascencion Dike, MD;  Location: [facility];  Service: ENT;  Laterality: Left;   Family History  Problem Relation Age of Onset  . Heart disease Mother   . Breast cancer Mother   . Stomach cancer Father    History  Substance Use Topics  . Smoking status: Never Smoker   . Smokeless tobacco: Never Used  . Alcohol Use: No   OB History    No data available     Review of Systems  Constitutional: Negative for fever and chills.  Respiratory: Negative for cough.   Cardiovascular: Negative for chest pain.  Gastrointestinal: Positive for nausea, abdominal pain, diarrhea and anorexia. Negative for vomiting and constipation.  Genitourinary: Negative for dysuria.  All other systems reviewed and are negative.     Allergies  Amoxicillin-pot clavulanate  Home Medications   Prior to Admission medications   Medication Sig Start Date End Date Taking? Authorizing Provider  albuterol (PROVENTIL HFA;VENTOLIN HFA) 108 (90 BASE) MCG/ACT inhaler Inhale 2 puffs into the lungs every 6 (six) hours as needed for shortness of breath. 10/17/13  Yes Tanda Rockers, MD  alendronate (FOSAMAX) 5  MG tablet Take 5 mg by mouth daily before breakfast. Take with a full glass of water on an empty stomach.   Yes Historical Provider, MD  budesonide-formoterol (SYMBICORT) 160-4.5 MCG/ACT inhaler Inhale 2 puffs into the lungs 2 (two) times daily. 08/09/14  Yes Kathee Delton, MD  Calcium Citrate-Vitamin D (CITRACAL PETITES/VITAMIN D) 200-250 MG-UNIT TABS Take 1 tablet by mouth daily.     Yes Historical Provider, MD  GLUCOSAMINE PO Take 1 tablet by mouth daily.     Yes Historical Provider, MD  metoprolol succinate (TOPROL-XL) 100 MG 24 hr tablet TAKE 1/2 (ONE-HALF) TABLET BY MOUTH EVERY MORNING 09/21/14  Yes  Dorena Cookey, MD  omeprazole (PRILOSEC OTC) 20 MG tablet Take 20 mg by mouth daily.    Yes Historical Provider, MD  predniSONE (DELTASONE) 10 MG tablet Take 5 mg by mouth daily with breakfast.   Yes Historical Provider, MD  vitamin C (ASCORBIC ACID) 500 MG tablet Take 500 mg by mouth daily.     Yes Historical Provider, MD  HYDROcodone-homatropine (HYCODAN) 5-1.5 MG/5ML syrup 1/2 teaspoon 3 times daily Patient taking differently: Take 2.5 mLs by mouth every 6 (six) hours as needed.  08/02/14   Dorena Cookey, MD  metroNIDAZOLE (FLAGYL) 250 MG tablet Take 1 tab 4 times daily with food. Patient not taking: Reported on 09/27/2014 08/23/14   Amy S Esterwood, PA-C  saccharomyces boulardii (FLORASTOR) 250 MG capsule Take 1 twice daily for 3 weeks Patient not taking: Reported on 09/27/2014 08/31/14   Amy S Esterwood, PA-C   BP 135/59 mmHg  Pulse 70  Temp(Src) 98.9 F (37.2 C) (Oral)  Resp 12  Ht 5\' 5"  (1.651 m)  Wt 128 lb (58.06 kg)  BMI 21.30 kg/m2  SpO2 97% Physical Exam  Constitutional: She is oriented to person, place, and time. She appears well-developed and well-nourished. No distress.  HENT:  Head: Normocephalic and atraumatic.  Mouth/Throat: Oropharynx is clear and moist.  Eyes: Conjunctivae and EOM are normal. Pupils are equal, round, and reactive to light.  Neck: Normal range of motion. Neck supple.  Cardiovascular: Normal rate, regular rhythm and intact distal pulses.   No murmur heard. Pulmonary/Chest: Effort normal and breath sounds normal. No respiratory distress. She has no wheezes. She has no rales.  Abdominal: Soft. Bowel sounds are normal. She exhibits no distension. There is tenderness in the right lower quadrant, suprapubic area and left lower quadrant. There is no rebound, no guarding, no CVA tenderness, no tenderness at McBurney's point and negative Murphy's sign.  Musculoskeletal: Normal range of motion. She exhibits no edema or tenderness.  Neurological: She is alert  and oriented to person, place, and time.  Skin: Skin is warm and dry. No rash noted. No erythema.  Psychiatric: She has a normal mood and affect. Her behavior is normal.  Nursing note and vitals reviewed.   ED Course  Procedures (including critical care time) Labs Review Labs Reviewed  CBC WITH DIFFERENTIAL - Abnormal; Notable for the following:    Neutrophils Relative % 85 (*)    Neutro Abs 8.5 (*)    Lymphocytes Relative 8 (*)    All other components within normal limits  COMPREHENSIVE METABOLIC PANEL - Abnormal; Notable for the following:    Glucose, Bld 199 (*)    GFR calc non Af Amer 79 (*)    All other components within normal limits  URINALYSIS, ROUTINE W REFLEX MICROSCOPIC - Abnormal; Notable for the following:    Leukocytes, UA TRACE (*)  All other components within normal limits  LIPASE, BLOOD  URINE MICROSCOPIC-ADD ON    Imaging Review Ct Abdomen Pelvis W Contrast  09/27/2014   CLINICAL DATA:  Diarrhea for 3-4 months.  EXAM: CT ABDOMEN AND PELVIS WITH CONTRAST  TECHNIQUE: Multidetector CT imaging of the abdomen and pelvis was performed using the standard protocol following bolus administration of intravenous contrast.  CONTRAST:  147mL OMNIPAQUE IOHEXOL 300 MG/ML  SOLN  COMPARISON:  02/27/2013  FINDINGS: Lower chest: The lung bases are clear of acute process. A calcified pleural plaque is noted at the left lung base. The heart is normal in size. No pericardial effusion. A few small scattered lower right axillary lymph nodes are noted. There is a moderate-sized hiatal hernia.  Hepatobiliary: There is diffuse fatty infiltration of the liver. No focal hepatic lesions. There is mild central intrahepatic biliary dilatation and moderate chronic common bile duct dilatation. There is also chronic cystic duct remnant dilatation. The gallbladder is surgically absent.  Pancreas: Normal.  Spleen: Calcified granulomas are noted.  No worrisome lesions.  Adrenals/Urinary Tract: The adrenal  glands and kidneys are unremarkable. No renal calculi are mass. Small renal cysts are noted. No ureteral or bladder calculi.  Stomach/Bowel: The stomach, duodenum, small bowel and colon are unremarkable. No inflammatory changes, mass lesions or obstructive findings. I do not see any evidence of inflammatory bowel disease or acute diverticulitis. Moderate diverticulosis of the sigmoid colon.  Vascular/Lymphatic: No mesenteric or retroperitoneal mass or adenopathy. Moderate to advanced atherosclerotic calcifications involving the aorta and branch vessels but no focal aneurysm are dissection.  Reproductive: The uterus and ovaries are unremarkable. The bladder is normal. No pelvic mass, adenopathy or free pelvic fluid collections. No inguinal mass or adenopathy.  Other: No anterior abdominal wall hernia or subcutaneous lesions.  Musculoskeletal: No significant bony findings.  IMPRESSION: Stable post cholecystectomy intra and extrahepatic biliary dilatation and the cystic duct remnant dilatation.  Diffuse fatty infiltration of the liver.  No acute abdominal/ pelvic findings, mass lesions or adenopathy.   Electronically Signed   By: Kalman Jewels M.D.   On: 09/27/2014 19:47     EKG Interpretation None      MDM   Final diagnoses:  Abdominal pain  Diarrhea   patient with approximately 3 months of persistent diarrhea that is worse when she attempts to eat. However patient states her abdominal pain is getting worse and she will have intermittent severe cramps. She admits to decreased by mouth intake. She seen her doctor for these episodes and had negative GI stool pathogen studies done. Labs have been normal. She saw about a GI on 08/24/2014 and at that time they wanted to evaluate a GI pathogen study before doing any further testing. Patient denies prior antibiotic use to the diarrhea but has had 3 rounds of antibiotics since then. She is taken 2 rounds of antibiotics for urinary tract infection was also put  on Flagyl and floor store by GI without improvement in her diarrhea. She is taking Lomotil without improvement. He has lower abdominal pain today on exam. Labs are within normal limits. We'll do a CT scan to evaluate for mass versus diverticulitis. Her last colonoscopy was in 2002 and discussed with family following up for repeat colonoscopy for further evaluation CT was normal.  Currently patient is denying any medication for pain and states that her abdominal pain is minimal   8:14 PM CT neg.  Pt will f/u with GI.  Discussed diet choices and getting the new  ppx she had called in for diarrhea.  Blanchie Dessert, MD 09/27/14 2015

## 2014-09-27 NOTE — ED Notes (Signed)
MD at bedside. 

## 2014-09-27 NOTE — ED Notes (Addendum)
Pt and daughter state pt has been having abd pain for a few months, was put on antibiotics by pcp for uti. States med was finished on Tuesday. Pt continues to have abd pain, that is intermittent, pcp advised her to come here today. States she will have "attacks" of severe lower abd pain that sometimes occurs when she has diarrhea. Episode of vomiting on Monday. NAD at this time.

## 2014-09-27 NOTE — ED Notes (Signed)
Pt reports abd pain that has been present for the past couple of months. Reports that she was seen at the PCP and given antibiotics for a week. States that she finished those but is still having pain. Also did a urine sample and had a UTI.

## 2014-09-28 ENCOUNTER — Telehealth: Payer: Self-pay | Admitting: Gastroenterology

## 2014-09-28 NOTE — Telephone Encounter (Signed)
Left message for patient to call back  

## 2014-09-28 NOTE — Telephone Encounter (Signed)
Patient is rescheduled for 10/04/15 3:15 for Dr. Fuller Plan .  Daughter aware of appt date an time

## 2014-10-03 ENCOUNTER — Ambulatory Visit (INDEPENDENT_AMBULATORY_CARE_PROVIDER_SITE_OTHER): Payer: Medicare Other | Admitting: Gastroenterology

## 2014-10-03 ENCOUNTER — Encounter: Payer: Self-pay | Admitting: Gastroenterology

## 2014-10-03 VITALS — BP 126/72 | HR 78 | Ht 60.0 in | Wt 122.4 lb

## 2014-10-03 DIAGNOSIS — R932 Abnormal findings on diagnostic imaging of liver and biliary tract: Secondary | ICD-10-CM

## 2014-10-03 DIAGNOSIS — R197 Diarrhea, unspecified: Secondary | ICD-10-CM

## 2014-10-03 DIAGNOSIS — R1030 Lower abdominal pain, unspecified: Secondary | ICD-10-CM

## 2014-10-03 NOTE — Progress Notes (Signed)
    History of Present Illness: This is an 80 year old female accompanied by her daughter. Pt was evaluated by Philis Nettle, PA-C in early November for diarrhea and lower abdominal pain. Evaluation to date includes a negative GI pathogen panel, unremarkable blood work and an abdominal/pelvic CT scan that showed mild biliary dilation, diffuse fatty infiltration of liver and moderate sigmoid colon diverticulosis. She was evaluated in the emergency department last week and I reviewed those records. She was prescribed a course of metronidazole and then Florastor. She states her diarrhea and abdominal pain have completely resolved at this point. She has no GI complaints today.  Current Medications, Allergies, Past Medical History, Past Surgical History, Family History and Social History were reviewed in [employer] record.  Physical Exam: General: Well developed , well nourished, no acute distress Head: Normocephalic and atraumatic Eyes:  sclerae anicteric, EOMI Ears: Normal auditory acuity Mouth: No deformity or lesions Lungs: Clear throughout to auscultation Heart: Regular rate and rhythm; no murmurs, rubs or bruits Abdomen: Soft, non tender and non distended. No masses, hepatosplenomegaly or hernias noted. Normal Bowel sounds Musculoskeletal: Symmetrical with no gross deformities  Pulses:  Normal pulses noted Extremities: No clubbing, cyanosis, edema or deformities noted Neurological: Alert oriented x 4, grossly nonfocal Psychological:  Alert and cooperative. Normal mood and affect  Assessment and Recommendations:  1. Diarrhea and lower abdominal pain, resolved. Possible bacterial overgrowth, lactose intolerance or IBS. Continue Florastor daily and Imodium bid as needed. Continue lactose free, low caffeine diet. REV if diarrhea recurs to consider starting an antispasmodic and consider colonoscopy.   2. Mild biliary dilation status post cholecystectomy with normal LFTs  and otherwise normal pancreatic and biliary imaging. This has been noted in the past and it is likely a post cholecystectomy change. No further evaluation at this point is recommended.

## 2014-10-03 NOTE — Patient Instructions (Addendum)
Continue florastor as directed. Follow a lactose free and caffeine free diet. Call if diarrhea or abdominal pain returns.

## 2014-10-17 ENCOUNTER — Ambulatory Visit: Payer: Medicare Other | Admitting: Gastroenterology

## 2014-11-09 ENCOUNTER — Encounter: Payer: Medicare Other | Admitting: Internal Medicine

## 2014-11-19 DIAGNOSIS — M25552 Pain in left hip: Secondary | ICD-10-CM | POA: Diagnosis not present

## 2014-11-19 DIAGNOSIS — M316 Other giant cell arteritis: Secondary | ICD-10-CM | POA: Diagnosis not present

## 2014-11-19 DIAGNOSIS — M2550 Pain in unspecified joint: Secondary | ICD-10-CM | POA: Diagnosis not present

## 2014-11-19 DIAGNOSIS — M810 Age-related osteoporosis without current pathological fracture: Secondary | ICD-10-CM | POA: Diagnosis not present

## 2014-11-19 DIAGNOSIS — M0609 Rheumatoid arthritis without rheumatoid factor, multiple sites: Secondary | ICD-10-CM | POA: Diagnosis not present

## 2014-11-21 ENCOUNTER — Other Ambulatory Visit: Payer: Self-pay | Admitting: Dermatology

## 2014-11-21 DIAGNOSIS — C44519 Basal cell carcinoma of skin of other part of trunk: Secondary | ICD-10-CM | POA: Diagnosis not present

## 2014-11-21 DIAGNOSIS — L570 Actinic keratosis: Secondary | ICD-10-CM | POA: Diagnosis not present

## 2014-11-21 DIAGNOSIS — D485 Neoplasm of uncertain behavior of skin: Secondary | ICD-10-CM | POA: Diagnosis not present

## 2014-11-21 DIAGNOSIS — L821 Other seborrheic keratosis: Secondary | ICD-10-CM | POA: Diagnosis not present

## 2014-11-21 DIAGNOSIS — Z85828 Personal history of other malignant neoplasm of skin: Secondary | ICD-10-CM | POA: Diagnosis not present

## 2014-11-21 DIAGNOSIS — C44509 Unspecified malignant neoplasm of skin of other part of trunk: Secondary | ICD-10-CM | POA: Diagnosis not present

## 2014-11-21 DIAGNOSIS — C7981 Secondary malignant neoplasm of breast: Secondary | ICD-10-CM | POA: Diagnosis not present

## 2014-11-23 ENCOUNTER — Other Ambulatory Visit: Payer: Self-pay | Admitting: Family Medicine

## 2014-11-23 DIAGNOSIS — N6310 Unspecified lump in the right breast, unspecified quadrant: Secondary | ICD-10-CM

## 2014-11-26 ENCOUNTER — Telehealth: Payer: Self-pay | Admitting: Family Medicine

## 2014-11-26 NOTE — Telephone Encounter (Signed)
Pt states the dr is Dr Ubaldo Glassing  [phone].

## 2014-11-27 NOTE — Telephone Encounter (Signed)
Left message on machine for medical records

## 2014-11-28 NOTE — Telephone Encounter (Signed)
Records received and given to Neoma Laming to send with referrals.

## 2014-11-29 ENCOUNTER — Other Ambulatory Visit: Payer: Self-pay | Admitting: Family Medicine

## 2014-11-29 ENCOUNTER — Ambulatory Visit (INDEPENDENT_AMBULATORY_CARE_PROVIDER_SITE_OTHER): Payer: Medicare Other | Admitting: Internal Medicine

## 2014-11-29 DIAGNOSIS — I442 Atrioventricular block, complete: Secondary | ICD-10-CM

## 2014-11-29 DIAGNOSIS — Z85828 Personal history of other malignant neoplasm of skin: Secondary | ICD-10-CM | POA: Diagnosis not present

## 2014-11-29 DIAGNOSIS — C44511 Basal cell carcinoma of skin of breast: Secondary | ICD-10-CM | POA: Diagnosis not present

## 2014-11-29 DIAGNOSIS — R234 Changes in skin texture: Secondary | ICD-10-CM

## 2014-11-29 DIAGNOSIS — C50111 Malignant neoplasm of central portion of right female breast: Secondary | ICD-10-CM | POA: Diagnosis not present

## 2014-12-03 ENCOUNTER — Ambulatory Visit: Payer: Self-pay | Admitting: Podiatry

## 2014-12-03 NOTE — Progress Notes (Signed)
Cancelled appointment

## 2014-12-04 ENCOUNTER — Encounter: Payer: Self-pay | Admitting: *Deleted

## 2014-12-04 ENCOUNTER — Telehealth: Payer: Self-pay | Admitting: *Deleted

## 2014-12-04 DIAGNOSIS — C50119 Malignant neoplasm of central portion of unspecified female breast: Secondary | ICD-10-CM

## 2014-12-04 DIAGNOSIS — C50111 Malignant neoplasm of central portion of right female breast: Secondary | ICD-10-CM | POA: Insufficient documentation

## 2014-12-04 HISTORY — DX: Malignant neoplasm of central portion of unspecified female breast: C50.119

## 2014-12-04 NOTE — Telephone Encounter (Signed)
Confirmed [facility] for 12/12/14 at 1200 .  Instructions and contact information given.

## 2014-12-06 ENCOUNTER — Ambulatory Visit
Admission: RE | Admit: 2014-12-06 | Discharge: 2014-12-06 | Disposition: A | Discharge status: Home / Self Care | Payer: Medicare Other | Source: Ambulatory Visit | Attending: Family Medicine | Admitting: Family Medicine

## 2014-12-06 ENCOUNTER — Other Ambulatory Visit: Payer: Self-pay | Admitting: Family Medicine

## 2014-12-06 ENCOUNTER — Other Ambulatory Visit: Payer: Medicare Other

## 2014-12-06 DIAGNOSIS — N6320 Unspecified lump in the left breast, unspecified quadrant: Secondary | ICD-10-CM

## 2014-12-06 DIAGNOSIS — C779 Secondary and unspecified malignant neoplasm of lymph node, unspecified: Secondary | ICD-10-CM | POA: Diagnosis not present

## 2014-12-06 DIAGNOSIS — C50312 Malignant neoplasm of lower-inner quadrant of left female breast: Secondary | ICD-10-CM | POA: Diagnosis not present

## 2014-12-06 DIAGNOSIS — R234 Changes in skin texture: Secondary | ICD-10-CM

## 2014-12-06 DIAGNOSIS — C50412 Malignant neoplasm of upper-outer quadrant of left female breast: Secondary | ICD-10-CM | POA: Diagnosis not present

## 2014-12-06 DIAGNOSIS — R599 Enlarged lymph nodes, unspecified: Secondary | ICD-10-CM | POA: Diagnosis not present

## 2014-12-06 DIAGNOSIS — R921 Mammographic calcification found on diagnostic imaging of breast: Secondary | ICD-10-CM

## 2014-12-06 DIAGNOSIS — N63 Unspecified lump in breast: Secondary | ICD-10-CM | POA: Diagnosis not present

## 2014-12-06 DIAGNOSIS — N6489 Other specified disorders of breast: Secondary | ICD-10-CM | POA: Diagnosis not present

## 2014-12-06 DIAGNOSIS — N6310 Unspecified lump in the right breast, unspecified quadrant: Secondary | ICD-10-CM

## 2014-12-08 DIAGNOSIS — C50919 Malignant neoplasm of unspecified site of unspecified female breast: Secondary | ICD-10-CM

## 2014-12-08 HISTORY — DX: Malignant neoplasm of unspecified site of unspecified female breast: C50.919

## 2014-12-10 ENCOUNTER — Other Ambulatory Visit: Payer: Medicare Other

## 2014-12-11 ENCOUNTER — Encounter: Payer: Self-pay | Admitting: Internal Medicine

## 2014-12-11 DIAGNOSIS — I441 Atrioventricular block, second degree: Secondary | ICD-10-CM | POA: Diagnosis not present

## 2014-12-12 ENCOUNTER — Encounter: Payer: Self-pay | Admitting: Physical Therapy

## 2014-12-12 ENCOUNTER — Encounter: Payer: Self-pay | Admitting: Oncology

## 2014-12-12 ENCOUNTER — Other Ambulatory Visit (HOSPITAL_BASED_OUTPATIENT_CLINIC_OR_DEPARTMENT_OTHER): Payer: Medicare Other

## 2014-12-12 ENCOUNTER — Ambulatory Visit (HOSPITAL_BASED_OUTPATIENT_CLINIC_OR_DEPARTMENT_OTHER): Payer: Medicare Other | Admitting: Oncology

## 2014-12-12 ENCOUNTER — Ambulatory Visit
Admission: RE | Admit: 2014-12-12 | Discharge: 2014-12-12 | Disposition: A | Discharge status: Home / Self Care | Payer: Medicare Other | Source: Ambulatory Visit | Attending: Radiation Oncology | Admitting: Radiation Oncology

## 2014-12-12 ENCOUNTER — Ambulatory Visit: Payer: Medicare Other

## 2014-12-12 ENCOUNTER — Ambulatory Visit: Payer: Medicare Other | Attending: General Surgery | Admitting: Physical Therapy

## 2014-12-12 VITALS — BP 140/46 | HR 74 | Temp 97.8°F | Resp 18 | Ht 60.0 in | Wt 124.1 lb

## 2014-12-12 DIAGNOSIS — I442 Atrioventricular block, complete: Secondary | ICD-10-CM

## 2014-12-12 DIAGNOSIS — Z170 Estrogen receptor positive status [ER+]: Secondary | ICD-10-CM | POA: Diagnosis not present

## 2014-12-12 DIAGNOSIS — R293 Abnormal posture: Secondary | ICD-10-CM | POA: Diagnosis not present

## 2014-12-12 DIAGNOSIS — M316 Other giant cell arteritis: Secondary | ICD-10-CM

## 2014-12-12 DIAGNOSIS — C50111 Malignant neoplasm of central portion of right female breast: Secondary | ICD-10-CM

## 2014-12-12 DIAGNOSIS — Z9889 Other specified postprocedural states: Secondary | ICD-10-CM | POA: Diagnosis not present

## 2014-12-12 DIAGNOSIS — J479 Bronchiectasis, uncomplicated: Secondary | ICD-10-CM | POA: Diagnosis not present

## 2014-12-12 DIAGNOSIS — Z803 Family history of malignant neoplasm of breast: Secondary | ICD-10-CM | POA: Diagnosis not present

## 2014-12-12 DIAGNOSIS — M059 Rheumatoid arthritis with rheumatoid factor, unspecified: Secondary | ICD-10-CM | POA: Diagnosis not present

## 2014-12-12 DIAGNOSIS — C50912 Malignant neoplasm of unspecified site of left female breast: Secondary | ICD-10-CM | POA: Diagnosis not present

## 2014-12-12 DIAGNOSIS — C50412 Malignant neoplasm of upper-outer quadrant of left female breast: Secondary | ICD-10-CM

## 2014-12-12 DIAGNOSIS — C50911 Malignant neoplasm of unspecified site of right female breast: Secondary | ICD-10-CM | POA: Diagnosis not present

## 2014-12-12 DIAGNOSIS — C773 Secondary and unspecified malignant neoplasm of axilla and upper limb lymph nodes: Secondary | ICD-10-CM

## 2014-12-12 DIAGNOSIS — J471 Bronchiectasis with (acute) exacerbation: Secondary | ICD-10-CM

## 2014-12-12 DIAGNOSIS — K760 Fatty (change of) liver, not elsewhere classified: Secondary | ICD-10-CM

## 2014-12-12 DIAGNOSIS — Z950 Presence of cardiac pacemaker: Secondary | ICD-10-CM

## 2014-12-12 LAB — CBC WITH DIFFERENTIAL/PLATELET
BASO%: 0.4 % (ref 0.0–2.0)
Basophils Absolute: 0 10*3/uL (ref 0.0–0.1)
EOS%: 2.7 % (ref 0.0–7.0)
Eosinophils Absolute: 0.2 10*3/uL (ref 0.0–0.5)
HCT: 42.4 % (ref 34.8–46.6)
HGB: 13.8 g/dL (ref 11.6–15.9)
LYMPH%: 26.8 % (ref 14.0–49.7)
MCH: 30.1 pg (ref 25.1–34.0)
MCHC: 32.5 g/dL (ref 31.5–36.0)
MCV: 92.4 fL (ref 79.5–101.0)
MONO#: 0.9 10*3/uL (ref 0.1–0.9)
MONO%: 11.3 % (ref 0.0–14.0)
NEUT#: 4.9 10*3/uL (ref 1.5–6.5)
NEUT%: 58.8 % (ref 38.4–76.8)
Platelets: 238 10*3/uL (ref 145–400)
RBC: 4.59 10*6/uL (ref 3.70–5.45)
RDW: 14.3 % (ref 11.2–14.5)
WBC: 8.2 10*3/uL (ref 3.9–10.3)
lymph#: 2.2 10*3/uL (ref 0.9–3.3)

## 2014-12-12 LAB — COMPREHENSIVE METABOLIC PANEL (CC13)
ALT: 21 U/L (ref 0–55)
ANION GAP: 6 meq/L (ref 3–11)
AST: 24 U/L (ref 5–34)
Albumin: 3.5 g/dL (ref 3.5–5.0)
Alkaline Phosphatase: 84 U/L (ref 40–150)
BILIRUBIN TOTAL: 1.05 mg/dL (ref 0.20–1.20)
BUN: 14.4 mg/dL (ref 7.0–26.0)
CO2: 30 mEq/L — ABNORMAL HIGH (ref 22–29)
Calcium: 9.2 mg/dL (ref 8.4–10.4)
Chloride: 102 mEq/L (ref 98–109)
Creatinine: 0.8 mg/dL (ref 0.6–1.1)
EGFR: 66 mL/min/{1.73_m2} — AB (ref >90)
GLUCOSE: 113 mg/dL (ref 70–140)
Potassium: 4.2 mEq/L (ref 3.5–5.1)
Sodium: 139 mEq/L (ref 136–145)
TOTAL PROTEIN: 6.5 g/dL (ref 6.4–8.3)

## 2014-12-12 NOTE — Patient Instructions (Signed)

## 2014-12-12 NOTE — Progress Notes (Signed)
Checked in new pt with no financial concerns prior to seeing the dr. [name] has 2 insurance so financial assistance may not be needed but she has Raquel's card for any billing questions or concerns.

## 2014-12-12 NOTE — Progress Notes (Signed)
[facility]  Telephone:([phone] Fax:([phone]) [phone]     ID: Lauren Buchanan DOB: [birth year]  MR#: [id number]  YIF#:[id number]  Patient Care Team: Dorena Cookey, MD as PCP - General (Family Medicine) Fanny Skates, MD as Consulting Physician (General Surgery) Chauncey Cruel, MD as Consulting Physician (Oncology) Jodelle Gross, MD as Consulting Physician (Radiation Oncology) Roselee Culver, RN as Registered Nurse [facility] Caleen Jobs, RN as Registered Nurse PCP: Joycelyn Man, MD GYN: SU:  OTHER MD: Danton Sewer MD, Thompson Grayer MD, Unice Bailey MD, Rolm Bookbinder MD  CHIEF COMPLAINT: bilateral estrogen receptor positive breast cancer  CURRENT TREATMENT: awaiting definitive surgery  NOTE: Patient requests her daughter Tonye Royalty contacted regarding all appointments and procedures. [name] can be reached at [place] HISTORY: "Dot"  Has been aware of a sore right nipple for the last 6-8 months. She attributed it to her washing machine, which rubs that area when she uses it. On  11/21/2014 she saw Dr. 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 She mentioned the chest wall problem and Dr. Ubaldo Glassing obtained to skin biopsies 1 from the right inferior central chest and the other one of from the right mid medial chest.  The second of these showed a nodular basal cell carcinoma. The first one however showed invasive carcinoma consistent with a breast primary. This tumor was estrogen receptor positive at 100%, progesterone receptor positive at 36%, both with strong staining intensity, and HER-2 was amplified , the signals ratio being 6.2. [name] [id number])  On 12/06/2014 the patient underwent bilateral diagnostic mammography with bilateral ultrasonography. Breast density was category B. There was bilateral nipple retraction. In the left breast upper outer quadrant there was a spiculated mass noted by mammography with a second  spiculated mass in the lower inner quadrant. By ultrasound there was a 1.6 cm hypoechoic lesion at the 1:30 o'clock position in the left breast, and a second mass more medially measuring 0.9 cm. Also at the 8:00 position in the left breast 2 cm from the nipple there was a 1.3 cm mass. Ultrasound of the left axilla showed an enlarged lymph node with fatty hilum replacement, measuring 1.4 cm.  In the right breast mammography showed a cluster of indeterminate microcalcifications in the upper outer quadrant and a retroareolar mass, which bile does sound measured 2.9 cm. Ultrasound of the right axilla showed a right axilla lymph node measuring 1.4 cm.  On 12/06/2014 the patient underwent needle core biopsy of 2 separate masses in the left breast (at 1:00 and 8:00) as well as the enlarged axillary lymph node. All 3 were positive for an invasive lobular carcinoma (E-cadherin negative). All 3 tumors were estrogen receptor positive with strong staining intensity (between 87% and 100%). The lymph node was progesterone receptor negative, the other 2 masses being progesterone receptor positive at 9% and at 76%. This cancer was reported to be HER-2 positive at conference today, though I do not have the ratios at hand.  The patient is not an MRI candidate because she has a permanent pacemaker in place. Her subsequent history is as detailed below   INTERVAL HISTORY: I met withDot and her daughters Juliann Pulse and [name] in the multidisciplinary breast cancer clinic today. Her case was also presented in the multidisciplinary breast cancer conference this morning. At that time a preliminary accommodation was made for bilateral mastectomies without formal lymphadenectomy. Whether the patient would benefit from postmastectomy radiation was left  unclear. It was felt the patient would clearly benefit from antiestrogen therapy long-term, but the benefits of anti-HER-2 treatment were felt to be possibly more questionable so that question  also was left open.  REVIEW OF SYSTEMS: Aside from the nipple problems itself, there were no specific symptoms leading to the diagnostic mammogram. The patient denies unusual headaches, visual changes, nausea, vomiting, stiff neck, dizziness, or gait imbalance. Sometimes she has difficulty swallowing. She currently has some sinus problems and a little bit of hoarseness. There has been no cough, phlegm production, or pleurisy, no chest pain or pressure, and no change in  bladder habits. She has had slightly looser stools lately. She has chronic back and joint pain which is not more intense or persistent than before. The patient denies fever, rash, bleeding, unexplained fatigue or unexplained weight loss. She admits to some forgetfulness. A detailed review of systems was otherwise entirely negative.  PAST MEDICAL HISTORY: Past Medical History  Diagnosis Date  . DJD (degenerative joint disease)   . IBS (irritable bowel syndrome)   . Goiter   . Complete heart block 1999    s/p PPM by Dr Olevia Perches, most recent generator change 2009  . Renal cyst   . COPD (chronic obstructive pulmonary disease)   . Headache(784.0)   . Pacemaker   . Wears glasses   . Hiatal hernia   . Esophageal stricture   . Diverticulosis   . Asthma   . Pneumonia   . Giant cell arteritis   . Cancer of central portion of female breast 12/04/2014  . Cancer of central portion of female breast 12/04/2014    PAST SURGICAL HISTORY: Past Surgical History  Procedure Laterality Date  . Pacemaker insertion  1999    Gen change (SJM) by Dr Olevia Perches 2009  . Cholecystectomy    . Left lower lobectomy for brochiectasis  1950  . Hemorrhoidectomy    . Renal cyst excision    . Tonsillectomy    . Fracture surgery      fx lt wrist  . Colonoscopy    . Artery biopsy Left 01/03/2013    Procedure: BIOPSY LEFT TEMPORAL ARTERY;  Surgeon: Ascencion Dike, MD;  Location: [facility];  Service: ENT;  Laterality: Left;    FAMILY  HISTORY Family History  Problem Relation Age of Onset  . Heart disease Mother   . Breast cancer Mother   . Stomach cancer Father    the patient's father died at the age of 49, with stomach cancer. The patient's mother died at the age of 76 from a myocardial infarction. She had been diagnosed with breast cancer in her 25s. The patient had no brothers, 2 sisters. There is no other history of breast or ovarian cancer in the family to the patient's knowledge  GYNECOLOGIC HISTORY:  No LMP recorded. Patient is postmenopausal. Menarche age 33, first live birth age 26. The patient is GX P4. She underwent menopause in her late 77s. She did not take hormone replacement.  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 She is a widow, lives by herself, with no pets. Daughter Wendy Poet lives in [place] where she is Mudlogger of a preschool and day care. Son Shakiara Lukic lives in [place] where he owns a furniture to sign store. Daughter Shelly Rubenstein lives in [place]. She owns a Chiropractor. Son Randall Hiss is physically and mentally disabled and lives in a group home in [place]. The  patient has 7 grandchildren and 2 great-grandchildren. She attends a local [place]: In place; the patient's daughter [name] is her healthcare power of attorney. She can be reached at [place]: History  Substance Use Topics  . Smoking status: Current Every Day Smoker -- 0.25 packs/day  . Smokeless tobacco: Never Used  . Alcohol Use: No     Colonoscopy:  PAP:  Bone density:  Lipid panel:  Allergies  Allergen Reactions  . Morphine And Related Nausea Only  . Amoxicillin-Pot Clavulanate Other (See Comments)    Unknown     Current Outpatient Prescriptions  Medication Sig Dispense Refill  . Calcium Citrate-Vitamin D (CITRACAL PETITES/VITAMIN D) 200-250 MG-UNIT TABS Take 1 tablet by mouth daily.      . metoprolol succinate  (TOPROL-XL) 100 MG 24 hr tablet TAKE 1/2 (ONE-HALF) TABLET BY MOUTH EVERY MORNING 30 tablet 4  . predniSONE (DELTASONE) 10 MG tablet Take 5 mg by mouth daily with breakfast.    . traMADol (ULTRAM) 50 MG tablet Take 50 mg by mouth every 6 (six) hours as needed.    . vitamin C (ASCORBIC ACID) 500 MG tablet Take 500 mg by mouth daily.      Marland Kitchen albuterol (PROVENTIL HFA;VENTOLIN HFA) 108 (90 BASE) MCG/ACT inhaler Inhale 2 puffs into the lungs every 6 (six) hours as needed for shortness of breath. (Patient not taking: Reported on 12/12/2014) 1 Inhaler 2  . budesonide-formoterol (SYMBICORT) 160-4.5 MCG/ACT inhaler Inhale 2 puffs into the lungs 2 (two) times daily. (Patient not taking: Reported on 12/12/2014) 3 Inhaler 3  . [DISCONTINUED] loratadine (CLARITIN) 10 MG tablet Take 10 mg by mouth daily.       No current facility-administered medications for this visit.    OBJECTIVE: Elderly white woman in no acute distress Filed Vitals:   12/12/14 1255  BP: 140/46  Pulse: 74  Temp: 97.8 F (36.6 C)  Resp: 18     Body mass index is 24.24 kg/(m^2).    ECOG FS:1 - Symptomatic but completely ambulatory  Ocular: Sclerae unicteric, EOMs intact Ear-nose-throat: Oropharynx clear, dentition in good repair Lymphatic: No cervical or supraclavicular adenopathy Lungs no rales or rhonchi, good excursion bilaterally Heart regular rate and rhythm Abd soft, nontender, positive bowel sounds, no masses palpated MSK no focal spinal tenderness, no joint edema Neuro: non-focal, well-oriented, appropriate affect Breasts: Both blasts are lumpy but I do not palpate a well-defined mass in the right breast. The nipple is retracted. I do not palpate a right axillary lymph node. On the left the patient is status post recent biopsies and there are multiple ecchymoses. The nipple is retracted. I do not palpate a left axillary lymph node   LAB RESULTS:  CMP     Component Value Date/Time   NA 139 12/12/2014 1149   NA 138  09/27/2014 1600   K 4.2 12/12/2014 1149   K 4.6 09/27/2014 1600   CL 98 09/27/2014 1600   CO2 30* 12/12/2014 1149   CO2 30 09/27/2014 1600   GLUCOSE 113 12/12/2014 1149   GLUCOSE 199* 09/27/2014 1600   BUN 14.4 12/12/2014 1149   BUN 9 09/27/2014 1600   CREATININE 0.8 12/12/2014 1149   CREATININE 0.62 09/27/2014 1600   CALCIUM 9.2 12/12/2014 1149   CALCIUM 9.7 09/27/2014 1600   PROT 6.5 12/12/2014 1149   PROT 6.9 09/27/2014 1600   ALBUMIN 3.5 12/12/2014 1149   ALBUMIN 3.7 09/27/2014 1600   AST 24 12/12/2014  1149   AST 30 09/27/2014 1600   ALT 21 12/12/2014 1149   ALT 27 09/27/2014 1600   ALKPHOS 84 12/12/2014 1149   ALKPHOS 66 09/27/2014 1600   BILITOT 1.05 12/12/2014 1149   BILITOT 0.5 09/27/2014 1600   GFRNONAA 79* 09/27/2014 1600   GFRAA >90 09/27/2014 1600    INo results found for: SPEP, UPEP  Lab Results  Component Value Date   WBC 8.2 12/12/2014   NEUTROABS 4.9 12/12/2014   HGB 13.8 12/12/2014   HCT 42.4 12/12/2014   MCV 92.4 12/12/2014   PLT 238 12/12/2014      Chemistry      Component Value Date/Time   NA 139 12/12/2014 1149   NA 138 09/27/2014 1600   K 4.2 12/12/2014 1149   K 4.6 09/27/2014 1600   CL 98 09/27/2014 1600   CO2 30* 12/12/2014 1149   CO2 30 09/27/2014 1600   BUN 14.4 12/12/2014 1149   BUN 9 09/27/2014 1600   CREATININE 0.8 12/12/2014 1149   CREATININE 0.62 09/27/2014 1600      Component Value Date/Time   CALCIUM 9.2 12/12/2014 1149   CALCIUM 9.7 09/27/2014 1600   ALKPHOS 84 12/12/2014 1149   ALKPHOS 66 09/27/2014 1600   AST 24 12/12/2014 1149   AST 30 09/27/2014 1600   ALT 21 12/12/2014 1149   ALT 27 09/27/2014 1600   BILITOT 1.05 12/12/2014 1149   BILITOT 0.5 09/27/2014 1600       No results found for: LABCA2  No components found for: LABCA125  No results for input(s): INR in the last 168 hours.  Urinalysis    Component Value Date/Time   COLORURINE YELLOW 09/27/2014 1800   APPEARANCEUR CLEAR 09/27/2014 1800    LABSPEC 1.005 09/27/2014 1800   PHURINE 7.0 09/27/2014 1800   GLUCOSEU NEGATIVE 09/27/2014 1800   HGBUR NEGATIVE 09/27/2014 1800   HGBUR trace-intact 01/17/2010 0932   BILIRUBINUR NEGATIVE 09/27/2014 1800   BILIRUBINUR n 09/01/2011 1157   KETONESUR NEGATIVE 09/27/2014 1800   PROTEINUR NEGATIVE 09/27/2014 1800   PROTEINUR n 09/01/2011 1157   UROBILINOGEN 0.2 09/27/2014 1800   UROBILINOGEN 0.2 09/01/2011 1157   NITRITE NEGATIVE 09/27/2014 1800   NITRITE n 09/01/2011 1157   LEUKOCYTESUR TRACE* 09/27/2014 1800    STUDIES: Mm Digital Diagnostic Bilat  12/06/2014   CLINICAL DATA:  Patient had recent skin biopsy of right breast which showed invasive carcinoma consistent with breast primary.  EXAM: DIGITAL DIAGNOSTIC BILATERAL MAMMOGRAM WITH CAD  ULTRASOUND BILATERAL BREAST  COMPARISON:  None.  ACR Breast Density Category b: There are scattered areas of fibroglandular density.  FINDINGS: Cc and MLO views of bilateral breasts, spot magnification CC and lateral views of the right breast are submitted. There is nipple retraction bilaterally. There is a spiculated mass in the upper-outer quadrant left breast. A second spiculated mass is identified in lower-inner quadrant left breast. Images of the right breast demonstrate a cluster of indeterminate microcalcifications in the upper-outer quadrant right breast. There is abnormal masslike lesion in the retroareolar right breast. Abnormal right axillary lymph node is noted.  Mammographic images were processed with CAD.  Targeted ultrasound is performed, showing a 1.3 x 1.2 x 1.6 cm spiculated hypoechoic lesion at the left breast 130 o'clock 3 cm from nipple correlating to the mass seen in the upper-outer quadrant left breast. There is a second hypoechoic mass 1 cm medial to the mass at the 1:30 position measuring 0.9 x 0.5 x 0.8 cm. At the left  breast 8 o'clock 2 cm from nipple, there is angulated spiculated mass measuring 1 x 1.3 x 0.7 cm correlating to the mass  seen in the lower medial left breast on mammogram. Ultrasound of the left axilla demonstrates an abnormal enlarged lymph node with fatty hilum replacement measuring 1.4 x 1 x 0.9 cm  Jugular limit ultrasound of the right breast demonstrates a 2.9 x 1.4 x 1.7 cm irregular hypoechoic mass in the subareolar right breast. Ultrasound of the right axilla demonstrate abnormal right axillary lymph node measuring 1.4 x 1 x 1 cm.  IMPRESSION: Known cancer.  RECOMMENDATION: Ultrasound-guided core biopsies of the left breast 8 o'clock mass, the larger left breast 1:30 o'clock mass, and abnormal left axillary lymph node.  Ultrasound-guided core biopsies of the right breast subareolar mass and abnormal right axillary lymph node.  Stereotactic core biopsy of right breast calcifications.  I have discussed the findings and recommendations with the patient. Results were also provided in writing at the conclusion of the visit. If applicable, a reminder letter will be sent to the patient regarding the next appointment.  BI-RADS CATEGORY  6: Known biopsy-proven malignancy.   Electronically Signed   By: Abelardo Diesel M.D.   On: 12/06/2014 13:31   Mm Digital Diagnostic Unilat L  12/06/2014   CLINICAL DATA:  Suspicious left breast masses and abnormal left axillary lymph node  EXAM: DIAGNOSTIC LEFT MAMMOGRAM POST ULTRASOUND BIOPSY  COMPARISON:  Previous exam(s).  FINDINGS: Mammographic images were obtained following left breast ultrasound guided biopsy of mass at the left breast 1:30 o'clock, mass and left breast 8 o'clock, abnormal left axillary lymph node. Cc and lateral view of the left breast demonstrate a coil clip in the mass of concern at 1:30 o'clock. The ribbon biopsy clip is seen adjacent to the left breast 8 o'clock mass.  IMPRESSION: Status post ultrasound-guided core biopsies with biopsy clips in the areas of concern as described.  Final Assessment: Post Procedure Mammograms for Marker Placement   Electronically Signed   By:  Abelardo Diesel M.D.   On: 12/06/2014 13:41   US Breast Ltd Uni Left Inc Axilla  12/06/2014   CLINICAL DATA:  Patient had recent skin biopsy of right breast which showed invasive carcinoma consistent with breast primary.  EXAM: DIGITAL DIAGNOSTIC BILATERAL MAMMOGRAM WITH CAD  ULTRASOUND BILATERAL BREAST  COMPARISON:  None.  ACR Breast Density Category b: There are scattered areas of fibroglandular density.  FINDINGS: Cc and MLO views of bilateral breasts, spot magnification CC and lateral views of the right breast are submitted. There is nipple retraction bilaterally. There is a spiculated mass in the upper-outer quadrant left breast. A second spiculated mass is identified in lower-inner quadrant left breast. Images of the right breast demonstrate a cluster of indeterminate microcalcifications in the upper-outer quadrant right breast. There is abnormal masslike lesion in the retroareolar right breast. Abnormal right axillary lymph node is noted.  Mammographic images were processed with CAD.  Targeted ultrasound is performed, showing a 1.3 x 1.2 x 1.6 cm spiculated hypoechoic lesion at the left breast 130 o'clock 3 cm from nipple correlating to the mass seen in the upper-outer quadrant left breast. There is a second hypoechoic mass 1 cm medial to the mass at the 1:30 position measuring 0.9 x 0.5 x 0.8 cm. At the left breast 8 o'clock 2 cm from nipple, there is angulated spiculated mass measuring 1 x 1.3 x 0.7 cm correlating to the mass seen in the lower medial left breast  on mammogram. Ultrasound of the left axilla demonstrates an abnormal enlarged lymph node with fatty hilum replacement measuring 1.4 x 1 x 0.9 cm  Jugular limit ultrasound of the right breast demonstrates a 2.9 x 1.4 x 1.7 cm irregular hypoechoic mass in the subareolar right breast. Ultrasound of the right axilla demonstrate abnormal right axillary lymph node measuring 1.4 x 1 x 1 cm.  IMPRESSION: Known cancer.  RECOMMENDATION: Ultrasound-guided  core biopsies of the left breast 8 o'clock mass, the larger left breast 1:30 o'clock mass, and abnormal left axillary lymph node.  Ultrasound-guided core biopsies of the right breast subareolar mass and abnormal right axillary lymph node.  Stereotactic core biopsy of right breast calcifications.  I have discussed the findings and recommendations with the patient. Results were also provided in writing at the conclusion of the visit. If applicable, a reminder letter will be sent to the patient regarding the next appointment.  BI-RADS CATEGORY  6: Known biopsy-proven malignancy.   Electronically Signed   By: Abelardo Diesel M.D.   On: 12/06/2014 13:31   US Breast Ltd Uni Right Inc Axilla  12/06/2014   CLINICAL DATA:  Patient had recent skin biopsy of right breast which showed invasive carcinoma consistent with breast primary.  EXAM: DIGITAL DIAGNOSTIC BILATERAL MAMMOGRAM WITH CAD  ULTRASOUND BILATERAL BREAST  COMPARISON:  None.  ACR Breast Density Category b: There are scattered areas of fibroglandular density.  FINDINGS: Cc and MLO views of bilateral breasts, spot magnification CC and lateral views of the right breast are submitted. There is nipple retraction bilaterally. There is a spiculated mass in the upper-outer quadrant left breast. A second spiculated mass is identified in lower-inner quadrant left breast. Images of the right breast demonstrate a cluster of indeterminate microcalcifications in the upper-outer quadrant right breast. There is abnormal masslike lesion in the retroareolar right breast. Abnormal right axillary lymph node is noted.  Mammographic images were processed with CAD.  Targeted ultrasound is performed, showing a 1.3 x 1.2 x 1.6 cm spiculated hypoechoic lesion at the left breast 130 o'clock 3 cm from nipple correlating to the mass seen in the upper-outer quadrant left breast. There is a second hypoechoic mass 1 cm medial to the mass at the 1:30 position measuring 0.9 x 0.5 x 0.8 cm. At the  left breast 8 o'clock 2 cm from nipple, there is angulated spiculated mass measuring 1 x 1.3 x 0.7 cm correlating to the mass seen in the lower medial left breast on mammogram. Ultrasound of the left axilla demonstrates an abnormal enlarged lymph node with fatty hilum replacement measuring 1.4 x 1 x 0.9 cm  Jugular limit ultrasound of the right breast demonstrates a 2.9 x 1.4 x 1.7 cm irregular hypoechoic mass in the subareolar right breast. Ultrasound of the right axilla demonstrate abnormal right axillary lymph node measuring 1.4 x 1 x 1 cm.  IMPRESSION: Known cancer.  RECOMMENDATION: Ultrasound-guided core biopsies of the left breast 8 o'clock mass, the larger left breast 1:30 o'clock mass, and abnormal left axillary lymph node.  Ultrasound-guided core biopsies of the right breast subareolar mass and abnormal right axillary lymph node.  Stereotactic core biopsy of right breast calcifications.  I have discussed the findings and recommendations with the patient. Results were also provided in writing at the conclusion of the visit. If applicable, a reminder letter will be sent to the patient regarding the next appointment.  BI-RADS CATEGORY  6: Known biopsy-proven malignancy.   Electronically Signed   By: Seward Meth  Augustin Coupe M.D.   On: 12/06/2014 13:31   [place] Lt Breast Bx W Loc Dev 1st Lesion Img Bx Spec US Guide  12/10/2014   ADDENDUM REPORT: 12/10/2014 08:12  ADDENDUM: Pathology revealed grade II invasive mammary carcinoma in the left breast at 1:00, grade II invasive ductal carcinoma in the left breast at 8:00, and metastatic mammary carcinoma in the left axillary lymph node. This was found to be concordant by Dr. Robina Ade. Pathology was discussed with Shelly Rubenstein, the patient's daughter, at the patient's request. The patient did well after the biopsy with slight oozing and tenderness at the biopsy sites. Post biopsy instructions and care were reviewed and her questions were answered. The patient is scheduled for  [facility] on December 12, 2014. The patient was scheduled for a right stereotactic biopsy and a right breast and axillary lymph node biopsy on Monday, December 10, 2014, but the daughter would like to cancel those appointments at this time. She would like to consult with the physicians on December 12, 2014 prior to any additional biopsies. My number was provided for future questions and concerns.  Pathology results reported by Susa Raring RN, BSN on December 10, 2014.   Electronically Signed   By: Abelardo Diesel M.D.   On: 12/10/2014 08:12   12/10/2014   CLINICAL DATA:  Abnormal left breast masses and left axillary lymph node for biopsy  EXAM: ULTRASOUND GUIDED LEFT BREAST CORE NEEDLE BIOPSY  COMPARISON:  Previous exam(s).  FINDINGS: I met with the patient and we discussed the procedure of ultrasound-guided biopsy, including benefits and alternatives. We discussed the high likelihood of a successful procedure. We discussed the risks of the procedure, including infection, bleeding, tissue injury, clip migration, and inadequate sampling. Informed written consent was given. The usual time-out protocol was performed immediately prior to the procedure.  Using sterile technique and 2% Lidocaine as local anesthetic, under direct ultrasound visualization, a 14 gauge spring-loaded device was used to perform biopsy of left breast 1:30 o'clock larger mass using a lateral approach. At the conclusion of the procedure a coil tissue marker clip was deployed into the biopsy cavity. Follow up 2 view mammogram was performed and dictated separately.  Using sterile technique and 2% Lidocaine as local anesthetic, under direct ultrasound visualization, a 14 gauge spring-loaded device was used to perform biopsy of left breast 8 o'clock using a lateral approach. At the conclusion of the procedure a ribbon tissue marker clip was deployed into the biopsy cavity.  Using sterile technique and 2%  Lidocaine as local anesthetic, under direct ultrasound visualization, a 14 gauge spring-loaded device was used to perform biopsy of abnormal left axillary lymph node using a lateral approach. At the conclusion of the procedure no tissue marker clip was deployed into the biopsy cavity.  IMPRESSION: Ultrasound guided biopsy of left breast masses and abnormal left axillary lymph node. No apparent complications.  Electronically Signed: By: Abelardo Diesel M.D. On: 12/06/2014 13:35   [place] Lt Breast Bx W Loc Dev Ea Add Lesion Img Bx Spec US Guide  12/10/2014   ADDENDUM REPORT: 12/10/2014 08:12  ADDENDUM: Pathology revealed grade II invasive mammary carcinoma in the left breast at 1:00, grade II invasive ductal carcinoma in the left breast at 8:00, and metastatic mammary carcinoma in the left axillary lymph node. This was found to be concordant by Dr. Robina Ade. Pathology was discussed with Shelly Rubenstein, the patient's daughter, at the patient's request. The patient did well after the  biopsy with slight oozing and tenderness at the biopsy sites. Post biopsy instructions and care were reviewed and her questions were answered. The patient is scheduled for [facility] on December 12, 2014. The patient was scheduled for a right stereotactic biopsy and a right breast and axillary lymph node biopsy on Monday, December 10, 2014, but the daughter would like to cancel those appointments at this time. She would like to consult with the physicians on December 12, 2014 prior to any additional biopsies. My number was provided for future questions and concerns.  Pathology results reported by Susa Raring RN, BSN on December 10, 2014.   Electronically Signed   By: Abelardo Diesel M.D.   On: 12/10/2014 08:12   12/10/2014   CLINICAL DATA:  Abnormal left breast masses and left axillary lymph node for biopsy  EXAM: ULTRASOUND GUIDED LEFT BREAST CORE NEEDLE BIOPSY  COMPARISON:  Previous exam(s).  FINDINGS:  I met with the patient and we discussed the procedure of ultrasound-guided biopsy, including benefits and alternatives. We discussed the high likelihood of a successful procedure. We discussed the risks of the procedure, including infection, bleeding, tissue injury, clip migration, and inadequate sampling. Informed written consent was given. The usual time-out protocol was performed immediately prior to the procedure.  Using sterile technique and 2% Lidocaine as local anesthetic, under direct ultrasound visualization, a 14 gauge spring-loaded device was used to perform biopsy of left breast 1:30 o'clock larger mass using a lateral approach. At the conclusion of the procedure a coil tissue marker clip was deployed into the biopsy cavity. Follow up 2 view mammogram was performed and dictated separately.  Using sterile technique and 2% Lidocaine as local anesthetic, under direct ultrasound visualization, a 14 gauge spring-loaded device was used to perform biopsy of left breast 8 o'clock using a lateral approach. At the conclusion of the procedure a ribbon tissue marker clip was deployed into the biopsy cavity.  Using sterile technique and 2% Lidocaine as local anesthetic, under direct ultrasound visualization, a 14 gauge spring-loaded device was used to perform biopsy of abnormal left axillary lymph node using a lateral approach. At the conclusion of the procedure no tissue marker clip was deployed into the biopsy cavity.  IMPRESSION: Ultrasound guided biopsy of left breast masses and abnormal left axillary lymph node. No apparent complications.  Electronically Signed: By: Abelardo Diesel M.D. On: 12/06/2014 13:35   [place] Lt Breast Bx W Loc Dev Ea Add Lesion Img Bx Spec US Guide  12/10/2014   ADDENDUM REPORT: 12/10/2014 08:12  ADDENDUM: Pathology revealed grade II invasive mammary carcinoma in the left breast at 1:00, grade II invasive ductal carcinoma in the left breast at 8:00, and metastatic mammary carcinoma in the  left axillary lymph node. This was found to be concordant by Dr. Robina Ade. Pathology was discussed with Shelly Rubenstein, the patient's daughter, at the patient's request. The patient did well after the biopsy with slight oozing and tenderness at the biopsy sites. Post biopsy instructions and care were reviewed and her questions were answered. The patient is scheduled for [facility] on December 12, 2014. The patient was scheduled for a right stereotactic biopsy and a right breast and axillary lymph node biopsy on Monday, December 10, 2014, but the daughter would like to cancel those appointments at this time. She would like to consult with the physicians on December 12, 2014 prior to any additional biopsies. My number was provided for future questions  and concerns.  Pathology results reported by Susa Raring RN, BSN on December 10, 2014.   Electronically Signed   By: Abelardo Diesel M.D.   On: 12/10/2014 08:12   12/10/2014   CLINICAL DATA:  Abnormal left breast masses and left axillary lymph node for biopsy  EXAM: ULTRASOUND GUIDED LEFT BREAST CORE NEEDLE BIOPSY  COMPARISON:  Previous exam(s).  FINDINGS: I met with the patient and we discussed the procedure of ultrasound-guided biopsy, including benefits and alternatives. We discussed the high likelihood of a successful procedure. We discussed the risks of the procedure, including infection, bleeding, tissue injury, clip migration, and inadequate sampling. Informed written consent was given. The usual time-out protocol was performed immediately prior to the procedure.  Using sterile technique and 2% Lidocaine as local anesthetic, under direct ultrasound visualization, a 14 gauge spring-loaded device was used to perform biopsy of left breast 1:30 o'clock larger mass using a lateral approach. At the conclusion of the procedure a coil tissue marker clip was deployed into the biopsy cavity. Follow up 2 view mammogram was performed and  dictated separately.  Using sterile technique and 2% Lidocaine as local anesthetic, under direct ultrasound visualization, a 14 gauge spring-loaded device was used to perform biopsy of left breast 8 o'clock using a lateral approach. At the conclusion of the procedure a ribbon tissue marker clip was deployed into the biopsy cavity.  Using sterile technique and 2% Lidocaine as local anesthetic, under direct ultrasound visualization, a 14 gauge spring-loaded device was used to perform biopsy of abnormal left axillary lymph node using a lateral approach. At the conclusion of the procedure no tissue marker clip was deployed into the biopsy cavity.  IMPRESSION: Ultrasound guided biopsy of left breast masses and abnormal left axillary lymph node. No apparent complications.  Electronically Signed: By: Abelardo Diesel M.D. On: 12/06/2014 13:35    ASSESSMENT: 81 y.o. [place] woman with bilateral breast cancer, as follows:  (1) status post right chest skin biopsy for a clinical T4 N1, stage IIIB invasive ductal carcinoma, estrogen and progesterone receptor positive, HER-2 amplified  (2) status post 2 separate left breast biopsies and one left axillary lymph node biopsy, all positive for a clinical mT1c N1, stage IIA invasive lobular carcinoma, estrogen receptor positive, progesterone receptor variable, with MIB-1 between 10 and 40%, and no HER-2 amplification  (3) definitive surgery to consist of bilateral mastectomies without formal axillary dissection  (4) adjuvant radiation depending on the final pathology results  (5) anti-estrogens to follow local treatment.  (6) consider anti-HER-2 treatment depending on the final pathology results  PLAN: We spent the better part of today's hour-long appointment discussing the biology of breast cancer in general, and the specifics of the patient's tumor in particular. Dot and her daughter's [name] and Juliann Pulse understand that our strong recommendation is that.proceed to  bilateral mastectomies. I would favor no formal axillary exploration, although a sentinel lymph node could be considered if he would not significantly increase the discomfort and trauma of the surgery in this very elderly patient.  Of course we do need to stage the patient and a PET scan has been requested. Note that she cannot have an MRI because of having a pacemaker in place. If we end up having stage IV disease, then they understand that they discussion we had today will not be as relevant and we would then postpone surgery or perhaps never do surgery but concentrated on tumor control alone, since under those circumstances a cancer would not be curable.  Stage III breast cancer is potentially curable however and the question is which systemic therapy would be best in this case. Certainly she will benefit from anti-estrogens and specifically today we discussed anastrozole. They have a good understanding of the possible toxicities side effects and complications of this agent. We also discussed potential cost problems.  Anti-HER-2 immunotherapy would be useful for the right-sided breast cancer. It would not affect the left sided breast cancer. He would require port placement and it might weaken the patient's heart muscle. For that reason I am reluctant to proceed with this form of therapy unless it becomes mandatory (for example if we have local recurrence on the right after definitive surgery).  I'm even more reluctant to consider chemotherapy in this case, given the patient's age and the fact that we do have many very effective antiestrogen options. I would think in terms of survival adding chemotherapy would make practically no difference and therefore this is not planned.  The patient will meet with Dr. Dalbert Batman in about 3 weeks to make a definitive decision regarding surgery. If she wishes to try anastrozole for a few months first I have no problems with that, but she will still need bilateral  mastectomies and therefore I see no particular advantage to neoadjuvant estrogens in this case.  Dot will return to see me in about 6 weeks. She has a good understanding of the overall plan. She agrees with it. She will call with any problems that may develop before her next visit here.  Chauncey Cruel, MD   12/12/2014 3:43 PM Medical Oncology and Hematology [facility] [address] [place], [place] [address] Tel. [phone]    Fax. [phone]

## 2014-12-12 NOTE — Therapy (Signed)
[place], [place], [address] Phone: [phone]   Fax:  [phone]  Physical Therapy Evaluation  Patient Details  Name: Lauren Buchanan MRN: [id number] Date of Birth: 03/30/1925 Referring Provider:  Fanny Skates, MD  Encounter Date: 12/12/2014      PT End of Session - 12/12/14 1607    Visit Number 1   Number of Visits 1   PT Start Time 1400   PT Stop Time 1428   PT Time Calculation (min) 28 min   Activity Tolerance Patient tolerated treatment well   Behavior During Therapy [facility] for tasks assessed/performed      Past Medical History  Diagnosis Date  . DJD (degenerative joint disease)   . IBS (irritable bowel syndrome)   . Goiter   . Complete heart block 1999    s/p PPM by Dr Olevia Perches, most recent generator change 2009  . Renal cyst   . COPD (chronic obstructive pulmonary disease)   . Headache(784.0)   . Pacemaker   . Wears glasses   . Hiatal hernia   . Esophageal stricture   . Diverticulosis   . Asthma   . Pneumonia   . Giant cell arteritis   . Cancer of central portion of female breast 12/04/2014  . Cancer of central portion of female breast 12/04/2014    Past Surgical History  Procedure Laterality Date  . Pacemaker insertion  1999    Gen change (SJM) by Dr Olevia Perches 2009  . Cholecystectomy    . Left lower lobectomy for brochiectasis  1950  . Hemorrhoidectomy    . Renal cyst excision    . Tonsillectomy    . Fracture surgery      fx lt wrist  . Colonoscopy    . Artery biopsy Left 01/03/2013    Procedure: BIOPSY LEFT TEMPORAL ARTERY;  Surgeon: Ascencion Dike, MD;  Location: [facility];  Service: ENT;  Laterality: Left;    There were no vitals taken for this visit.  Visit Diagnosis:  Abnormal posture - Plan: PT plan of care cert/re-cert  Bilateral breast cancer - Plan: PT plan of care cert/re-cert      Subjective Assessment - 12/12/14 1600    Symptoms Patient  was seen today for a baseline assessment of her newly diagnosed bilateral breast cancer.   Pertinent History Patient diagnosed 11/28/14 with bilateral breast cancer.  She has Triple positive breast cancer on the right side and node positive cancer on the left side.     Patient Stated Goals Reduce lymphedema risk and learn post op shoulder ROM HEP   Currently in Pain? No/denies          [facility] PT Assessment - 12/12/14 0001    Assessment   Medical Diagnosis bilateral breast cancer   Onset Date 11/28/14   Precautions   Precautions Other (comment)  Active breast cancer   Restrictions   Weight Bearing Restrictions No   Balance Screen   Has the patient fallen in the past 6 months No   Has the patient had a decrease in activity level because of a fear of falling?  No   Is the patient reluctant to leave their home because of a fear of falling?  No   Home Environment   Living Enviornment Private residence   Prior Function   Level of Independence Independent with basic ADLs   Vocation Retired   Leisure She does not exercise   Cognition   Overall  Cognitive Status Within Functional Limits for tasks assessed   Posture/Postural Control   Posture/Postural Control Postural limitations   Postural Limitations Rounded Shoulders;Forward head   ROM / Strength   AROM / PROM / Strength AROM;Strength   AROM   AROM Assessment Site Shoulder   Right/Left Shoulder Left;Right   Right Shoulder Extension 43 Degrees   Right Shoulder Flexion 130 Degrees   Right Shoulder ABduction 144 Degrees   Right Shoulder Internal Rotation 40 Degrees   Right Shoulder External Rotation 62 Degrees   Left Shoulder Extension 63 Degrees   Left Shoulder Flexion 119 Degrees   Left Shoulder ABduction 141 Degrees   Left Shoulder Internal Rotation 70 Degrees   Left Shoulder External Rotation 69 Degrees   Strength   Overall Strength Within functional limits for tasks performed           LYMPHEDEMA/ONCOLOGY QUESTIONNAIRE  - 12/12/14 1605    Type   Cancer Type Bilateral breast cancer   Lymphedema Assessments   Lymphedema Assessments Upper extremities   Right Upper Extremity Lymphedema   10 cm Proximal to Olecranon Process 24 cm   Olecranon Process 19.8 cm   10 cm Proximal to Ulnar Styloid Process 17.8 cm   Just Proximal to Ulnar Styloid Process 12.7 cm   Across Hand at [employer] 17.4 cm   At [place] of 2nd Digit 5.9 cm   Left Upper Extremity Lymphedema   10 cm Proximal to Olecranon Process 23.4 cm   Olecranon Process 20 cm   10 cm Proximal to Ulnar Styloid Process 17.7 cm   Just Proximal to Ulnar Styloid Process 12.4 cm   Across Hand at [employer] 16.6 cm   At [place] of 2nd Digit 5.8 cm      Patient was instructed today in a home exercise program today for post op shoulder range of motion. These included active assist shoulder flexion in sitting, scapular retraction, wall walking with shoulder abduction, and hands behind head external rotation.  She was encouraged to do these twice a day, holding 3 seconds and repeating 5 times when permitted by her physician.         PT Education - 12/12/14 1607    Education provided Yes   Education Details Post op shoulder ROM HEP and lymphedema risk reduction   Person(s) Educated Patient;Child(ren)   Methods Explanation;Handout;Demonstration   Comprehension Verbalized understanding              Breast Clinic Goals - 12/12/14 1611    Patient will be able to verbalize understanding of pertinent lymphedema risk reduction practices relevant to her diagnosis specifically related to skin care.   Time 1   Period Days   Status Achieved   Patient will be able to return demonstrate and/or verbalize understanding of the post-op home exercise program related to regaining shoulder range of motion.   Time 1   Period Days   Status Achieved   Patient will be able to verbalize understanding of the importance of attending the postoperative After Breast Cancer  Class for further lymphedema risk reduction education and therapeutic exercise.   Time 1   Period Days   Status Achieved              Plan - 12/12/14 1607    Clinical Impression Statement Patient was seen today for a baseline assessment of her bilateral breast cancer.  It has been recommended that she have a bilateral mastectomy with some lymph nodes removed.  She  is considering this option but is unsure if she wants to undergo that surgery.  She will also begin anti-estrogen therapy.   Pt will benefit from skilled therapeutic intervention in order to improve on the following deficits Decreased range of motion;Increased edema;Decreased knowledge of precautions;Pain;Impaired UE functional use;Decreased strength   Rehab Potential Good   Clinical Impairments Affecting Rehab Potential none   PT Frequency One time visit   PT Treatment/Interventions Patient/family education;Therapeutic exercise   Consulted and Agree with Plan of Care Patient     Patient will follow up at outpatient cancer rehab if needed following surgery.  If the patient requires physical therapy at that time, a specific plan will be dictated and sent to the referring physician for approval. The patient was educated today on appropriate basic range of motion exercises to begin post operatively and the importance of attending the After Breast Cancer class following surgery.  Patient was educated today on lymphedema risk reduction practices as it pertains to recommendations that will benefit the patient immediately following surgery.  She verbalized good understanding.  No additional physical therapy is indicated at this time.         G-Codes - Jan 07, 2015 1611    Functional Assessment Tool Used Clinical Judgement   Functional Limitation Other PT primary   Other PT Primary Current Status (Q2595) At least 1 percent but less than 20 percent impaired, limited or restricted   Other PT Primary Goal Status (G3875) At least 1 percent  but less than 20 percent impaired, limited or restricted   Other PT Primary Discharge Status (I4332) At least 1 percent but less than 20 percent impaired, limited or restricted       Problem List Patient Active Problem List   Diagnosis Date Noted  . Breast cancer of upper-outer quadrant of left female breast January 07, 2015  . Cancer of central portion of right female breast 12/04/2014  . Diarrhea 08/02/2014  . Giant cell arteritis 06/02/2013  . Dehydration with hyponatremia 02/28/2013  . Transaminitis 02/28/2013  . 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 SIRS (systemic inflammatory response syndrome) 02/27/2013  . Healthcare-associated pneumonia 02/27/2013  . Temporal arteritis 02/27/2013  . Blurred vision, bilateral 12/13/2012  . Other malaise and fatigue 12/13/2012  . Bronchiectasis with acute exacerbation 03/30/2012  . Bronchiectasis without acute exacerbation 01/01/2012  . MEMORY LOSS 08/07/2010  . SVT/ PSVT/ PAT 09/20/2009  . COPD with chronic bronchitis 08/21/2009  . PLANTAR FASCIITIS, BILATERAL 12/07/2008  . AV BLOCK, COMPLETE 09/07/2008  . PACEMAKER, PERMANENT 09/07/2008  . RHINITIS, ALLERGIC NEC 05/13/2007  . GOITER NOS 05/12/2007  . IBS 05/12/2007  . DEGENERATIVE JOINT DISEASE 05/12/2007    Annia Friendly, PT 01/07/2015, 4:13 PM  [place], [place], [address] Phone: [phone]   Fax:  [phone]

## 2014-12-13 ENCOUNTER — Telehealth: Payer: Self-pay | Admitting: *Deleted

## 2014-12-13 NOTE — Telephone Encounter (Signed)
Per note received from [place]- note printed and deferred to [facility] for follow up per recommended plan of care.

## 2014-12-13 NOTE — Telephone Encounter (Signed)
Lauren Buchanan was seen in Breast Clinic yesterday.  She was told she would have an aromatase inhibitor (specifically anastrazole) prescription sent to her pharmacy.  She went to pick it up and the pharmacy has not received it.  Per Dr. [name] note he was not going to prescribe an neoadjuvant AI, but the patient has decided not to have bilateral mastectomies, so would like to proceed with endocrine therapy.  Please advise her daughter when the Rx has been called in.

## 2014-12-14 ENCOUNTER — Telehealth: Payer: Self-pay | Admitting: *Deleted

## 2014-12-14 ENCOUNTER — Encounter: Payer: Self-pay | Admitting: Radiation Oncology

## 2014-12-14 ENCOUNTER — Other Ambulatory Visit: Payer: Self-pay | Admitting: Oncology

## 2014-12-14 ENCOUNTER — Other Ambulatory Visit: Payer: Self-pay | Admitting: *Deleted

## 2014-12-14 DIAGNOSIS — C50412 Malignant neoplasm of upper-outer quadrant of left female breast: Secondary | ICD-10-CM

## 2014-12-14 MED ORDER — ANASTROZOLE 1 MG PO TABS: 1.0000 mg | ORAL_TABLET | Freq: Every day | ORAL | Status: DC

## 2014-12-14 NOTE — Progress Notes (Signed)
Radiation Oncology         ([phone]) [phone] ________________________________  Name: Lauren Buchanan MRN: [id number]  Date: 12/12/2014  DOB: 1925/09/08  WU:JWJX,BJYNWGN ALLEN, MD  Fanny Skates, MD     REFERRING PHYSICIAN: Fanny Skates, MD   DIAGNOSIS: The encounter diagnosis was Breast cancer of upper-outer quadrant of left female breast.  Breast cancer of upper-outer quadrant of left female breast   Staging form: Breast, AJCC 7th Edition     Clinical stage from 12/12/2014: Stage IIA (T1c, N1, M0) - Unsigned    HISTORY OF PRESENT ILLNESS::Lauren Buchanan is a 81 y.o. female who is seen for an initial consultation visit regarding the patient's diagnosis of breast cancer.  She has presented with a complex presentation. The patient was initially seen by dermatology. 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 This was estrogen receptor positive, progesterone receptor positive, and HER-2 nu was amplified.  The patient seated to undergo a diagnostic mammogram and ultrasound. A spiculated mass was seen in the upper outer quadrant of the left breast. A second spiculated mass was present in the lower inner quadrant of the left breast. On the right, microcalcifications were seen in the upper outer quadrant and abnormal masslike lesion was seen in the retroareolar region. An abnormal right axillary lymph node was also seen. Targeted ultrasound was performed. This showed a 1.6 cm spiculated hypoechoic lesion within the left breast at the 1:30 o'clock position. A second hypoechoic mass was seen at the 1:30 o'clock position measuring 0.9 cm. On the left, at the 8:00 position a spiculated mass was seen measuring 1.3  cm. Ultrasound of the left axilla demonstrated an abnormal enlarged lymph node measuring 1.4 cm. A 2.9 cm area was seen which was irregular in the subareolar region on the right.   Multiple biopsies were obtained. Invasive mammary carcinoma was seen involving the left breast at the 1:00 position. Staining favors an invasive lobular carcinoma. Receptor studies indicated that this was estrogen receptor positive, progesterone receptor positive, and HER-2/neu negative. The Ki-67 staining was 30%. An additional biopsy at the 8:00 position from the left breast looked different and was consistent with invasive ductal carcinoma. Receptor studies indicate that the tumor is estrogen receptor positive, progesterone receptor positive, and HER-2/neu negative. The Ki-67 staining was 10%. The left sided lymph node was positive for metastatic mammary carcinoma. This was estrogen receptor positive, progesterone receptor negative, and HER-2/neu negative. The Ki-67 staining was 40%.  The patient has not undergone an MRI scan of the breasts: She has a pacemaker in place.    PREVIOUS RADIATION THERAPY: No   PAST MEDICAL HISTORY:  has a past medical history of DJD (degenerative joint disease); IBS (irritable bowel syndrome); Goiter; Complete heart block (1999); Renal cyst; COPD (chronic obstructive pulmonary disease); Headache(784.0); Pacemaker; Wears glasses; Hiatal hernia; Esophageal stricture; Diverticulosis; Asthma; Pneumonia; Giant cell arteritis; Cancer of central portion of female breast (12/04/2014); and Cancer of central portion of female breast (12/04/2014).     PAST SURGICAL HISTORY: Past Surgical History  Procedure Laterality Date  . Pacemaker insertion  1999    Gen change ([facility]) by Dr Olevia Perches 2009  . Cholecystectomy    . Left lower lobectomy for brochiectasis  1950  .  Hemorrhoidectomy    . Renal cyst excision    . Tonsillectomy    . Fracture surgery      fx lt wrist  . Colonoscopy    . Artery biopsy  Left 01/03/2013    Procedure: BIOPSY LEFT TEMPORAL ARTERY;  Surgeon: Ascencion Dike, MD;  Location: [facility];  Service: ENT;  Laterality: Left;     FAMILY HISTORY: family history includes Breast cancer in her mother; Heart disease in her mother; Stomach cancer in her father.   SOCIAL HISTORY:  reports that she has been smoking.  She has never used smokeless tobacco. She reports that she does not drink alcohol or use illicit drugs.   ALLERGIES: Morphine and related and Amoxicillin-pot clavulanate   MEDICATIONS:  Current Outpatient Prescriptions  Medication Sig Dispense Refill  . albuterol (PROVENTIL HFA;VENTOLIN HFA) 108 (90 BASE) MCG/ACT inhaler Inhale 2 puffs into the lungs every 6 (six) hours as needed for shortness of breath. (Patient not taking: Reported on 12/12/2014) 1 Inhaler 2  . budesonide-formoterol (SYMBICORT) 160-4.5 MCG/ACT inhaler Inhale 2 puffs into the lungs 2 (two) times daily. (Patient not taking: Reported on 12/12/2014) 3 Inhaler 3  . Calcium Citrate-Vitamin D (CITRACAL PETITES/VITAMIN D) 200-250 MG-UNIT TABS Take 1 tablet by mouth daily.      . metoprolol succinate (TOPROL-XL) 100 MG 24 hr tablet TAKE 1/2 (ONE-HALF) TABLET BY MOUTH EVERY MORNING 30 tablet 4  . predniSONE (DELTASONE) 10 MG tablet Take 5 mg by mouth daily with breakfast.    . traMADol (ULTRAM) 50 MG tablet Take 50 mg by mouth every 6 (six) hours as needed.    . vitamin C (ASCORBIC ACID) 500 MG tablet Take 500 mg by mouth daily.      . [DISCONTINUED] loratadine (CLARITIN) 10 MG tablet Take 10 mg by mouth daily.       No current facility-administered medications for this encounter.     REVIEW OF SYSTEMS:  A 15 point review of systems is documented in the electronic medical record. This was obtained by the nursing staff. However, I reviewed this with the patient to discuss relevant findings and make appropriate changes.  Pertinent items are noted in HPI.    PHYSICAL EXAM:  vitals were not  taken for this visit.  ECOG = 1  0 - Asymptomatic (Fully active, able to carry on all predisease activities without restriction)  1 - Symptomatic but completely ambulatory (Restricted in physically strenuous activity but ambulatory and able to carry out work of a light or sedentary nature. For example, light housework, office work)  2 - Symptomatic, <50% in bed during the day (Ambulatory and capable of all self care but unable to carry out any work activities. Up and about more than 50% of waking hours)  3 - Symptomatic, >50% in bed, but not bedbound (Capable of only limited self-care, confined to bed or chair 50% or more of waking hours)  4 - Bedbound (Completely disabled. Cannot carry on any self-care. Totally confined to bed or chair)  5 - Death   Eustace Pen MM, Creech RH, Tormey DC, et al. [phone]). "Toxicity and response criteria of the [facility]". [place] Oncol. 5 (6): 649-55  General: Well-developed, in no acute distress HEENT: Normocephalic, atraumatic; oral cavity clear Neck: Supple without any lymphadenopathy Cardiovascular: Regular rate and rhythm Respiratory: Clear to auscultation bilaterally Breasts: The breasts are lumpy bilateral. I really did not palpate a discrete mass in either breast. No axillary adenopathy was  palpable on either side. GI: Soft, nontender, normal bowel sounds Extremities: No edema present Neuro: No focal deficits     LABORATORY DATA:  Lab Results  Component Value Date   WBC 8.2 12/12/2014   HGB 13.8 12/12/2014   HCT 42.4 12/12/2014   MCV 92.4 12/12/2014   PLT 238 12/12/2014   Lab Results  Component Value Date   NA 139 12/12/2014   K 4.2 12/12/2014   CL 98 09/27/2014   CO2 30* 12/12/2014   Lab Results  Component Value Date   ALT 21 12/12/2014   AST 24 12/12/2014   ALKPHOS 84 12/12/2014   BILITOT 1.05 12/12/2014      RADIOGRAPHY: Mm Digital Diagnostic Bilat  12/06/2014   CLINICAL DATA:  Patient had recent  skin biopsy of right breast which showed invasive carcinoma consistent with breast primary.  EXAM: DIGITAL DIAGNOSTIC BILATERAL MAMMOGRAM WITH CAD  ULTRASOUND BILATERAL BREAST  COMPARISON:  None.  ACR Breast Density Category b: There are scattered areas of fibroglandular density.  FINDINGS: Cc and MLO views of bilateral breasts, spot magnification CC and lateral views of the right breast are submitted. There is nipple retraction bilaterally. There is a spiculated mass in the upper-outer quadrant left breast. A second spiculated mass is identified in lower-inner quadrant left breast. Images of the right breast demonstrate a cluster of indeterminate microcalcifications in the upper-outer quadrant right breast. There is abnormal masslike lesion in the retroareolar right breast. Abnormal right axillary lymph node is noted.  Mammographic images were processed with CAD.  Targeted ultrasound is performed, showing a 1.3 x 1.2 x 1.6 cm spiculated hypoechoic lesion at the left breast 130 o'clock 3 cm from nipple correlating to the mass seen in the upper-outer quadrant left breast. There is a second hypoechoic mass 1 cm medial to the mass at the 1:30 position measuring 0.9 x 0.5 x 0.8 cm. At the left breast 8 o'clock 2 cm from nipple, there is angulated spiculated mass measuring 1 x 1.3 x 0.7 cm correlating to the mass seen in the lower medial left breast on mammogram. Ultrasound of the left axilla demonstrates an abnormal enlarged lymph node with fatty hilum replacement measuring 1.4 x 1 x 0.9 cm  Jugular limit ultrasound of the right breast demonstrates a 2.9 x 1.4 x 1.7 cm irregular hypoechoic mass in the subareolar right breast. Ultrasound of the right axilla demonstrate abnormal right axillary lymph node measuring 1.4 x 1 x 1 cm.  IMPRESSION: Known cancer.  RECOMMENDATION: Ultrasound-guided core biopsies of the left breast 8 o'clock mass, the larger left breast 1:30 o'clock mass, and abnormal left axillary lymph node.   Ultrasound-guided core biopsies of the right breast subareolar mass and abnormal right axillary lymph node.  Stereotactic core biopsy of right breast calcifications.  I have discussed the findings and recommendations with the patient. Results were also provided in writing at the conclusion of the visit. If applicable, a reminder letter will be sent to the patient regarding the next appointment.  BI-RADS CATEGORY  6: Known biopsy-proven malignancy.   Electronically Signed   By: Abelardo Diesel M.D.   On: 12/06/2014 13:31   Mm Digital Diagnostic Unilat L  12/06/2014   CLINICAL DATA:  Suspicious left breast masses and abnormal left axillary lymph node  EXAM: DIAGNOSTIC LEFT MAMMOGRAM POST ULTRASOUND BIOPSY  COMPARISON:  Previous exam(s).  FINDINGS: Mammographic images were obtained following left breast ultrasound guided biopsy of mass at the left breast 1:30 o'clock, mass and left breast  8 o'clock, abnormal left axillary lymph node. Cc and lateral view of the left breast demonstrate a coil clip in the mass of concern at 1:30 o'clock. The ribbon biopsy clip is seen adjacent to the left breast 8 o'clock mass.  IMPRESSION: Status post ultrasound-guided core biopsies with biopsy clips in the areas of concern as described.  Final Assessment: Post Procedure Mammograms for Marker Placement   Electronically Signed   By: Abelardo Diesel M.D.   On: 12/06/2014 13:41   US Breast Ltd Uni Left Inc Axilla  12/06/2014   CLINICAL DATA:  Patient had recent skin biopsy of right breast which showed invasive carcinoma consistent with breast primary.  EXAM: DIGITAL DIAGNOSTIC BILATERAL MAMMOGRAM WITH CAD  ULTRASOUND BILATERAL BREAST  COMPARISON:  None.  ACR Breast Density Category b: There are scattered areas of fibroglandular density.  FINDINGS: Cc and MLO views of bilateral breasts, spot magnification CC and lateral views of the right breast are submitted. There is nipple retraction bilaterally. There is a spiculated mass in the  upper-outer quadrant left breast. A second spiculated mass is identified in lower-inner quadrant left breast. Images of the right breast demonstrate a cluster of indeterminate microcalcifications in the upper-outer quadrant right breast. There is abnormal masslike lesion in the retroareolar right breast. Abnormal right axillary lymph node is noted.  Mammographic images were processed with CAD.  Targeted ultrasound is performed, showing a 1.3 x 1.2 x 1.6 cm spiculated hypoechoic lesion at the left breast 130 o'clock 3 cm from nipple correlating to the mass seen in the upper-outer quadrant left breast. There is a second hypoechoic mass 1 cm medial to the mass at the 1:30 position measuring 0.9 x 0.5 x 0.8 cm. At the left breast 8 o'clock 2 cm from nipple, there is angulated spiculated mass measuring 1 x 1.3 x 0.7 cm correlating to the mass seen in the lower medial left breast on mammogram. Ultrasound of the left axilla demonstrates an abnormal enlarged lymph node with fatty hilum replacement measuring 1.4 x 1 x 0.9 cm  Jugular limit ultrasound of the right breast demonstrates a 2.9 x 1.4 x 1.7 cm irregular hypoechoic mass in the subareolar right breast. Ultrasound of the right axilla demonstrate abnormal right axillary lymph node measuring 1.4 x 1 x 1 cm.  IMPRESSION: Known cancer.  RECOMMENDATION: Ultrasound-guided core biopsies of the left breast 8 o'clock mass, the larger left breast 1:30 o'clock mass, and abnormal left axillary lymph node.  Ultrasound-guided core biopsies of the right breast subareolar mass and abnormal right axillary lymph node.  Stereotactic core biopsy of right breast calcifications.  I have discussed the findings and recommendations with the patient. Results were also provided in writing at the conclusion of the visit. If applicable, a reminder letter will be sent to the patient regarding the next appointment.  BI-RADS CATEGORY  6: Known biopsy-proven malignancy.   Electronically Signed   By:  Abelardo Diesel M.D.   On: 12/06/2014 13:31   US Breast Ltd Uni Right Inc Axilla  12/06/2014   CLINICAL DATA:  Patient had recent skin biopsy of right breast which showed invasive carcinoma consistent with breast primary.  EXAM: DIGITAL DIAGNOSTIC BILATERAL MAMMOGRAM WITH CAD  ULTRASOUND BILATERAL BREAST  COMPARISON:  None.  ACR Breast Density Category b: There are scattered areas of fibroglandular density.  FINDINGS: Cc and MLO views of bilateral breasts, spot magnification CC and lateral views of the right breast are submitted. There is nipple retraction bilaterally. There is a spiculated  mass in the upper-outer quadrant left breast. A second spiculated mass is identified in lower-inner quadrant left breast. Images of the right breast demonstrate a cluster of indeterminate microcalcifications in the upper-outer quadrant right breast. There is abnormal masslike lesion in the retroareolar right breast. Abnormal right axillary lymph node is noted.  Mammographic images were processed with CAD.  Targeted ultrasound is performed, showing a 1.3 x 1.2 x 1.6 cm spiculated hypoechoic lesion at the left breast 130 o'clock 3 cm from nipple correlating to the mass seen in the upper-outer quadrant left breast. There is a second hypoechoic mass 1 cm medial to the mass at the 1:30 position measuring 0.9 x 0.5 x 0.8 cm. At the left breast 8 o'clock 2 cm from nipple, there is angulated spiculated mass measuring 1 x 1.3 x 0.7 cm correlating to the mass seen in the lower medial left breast on mammogram. Ultrasound of the left axilla demonstrates an abnormal enlarged lymph node with fatty hilum replacement measuring 1.4 x 1 x 0.9 cm  Jugular limit ultrasound of the right breast demonstrates a 2.9 x 1.4 x 1.7 cm irregular hypoechoic mass in the subareolar right breast. Ultrasound of the right axilla demonstrate abnormal right axillary lymph node measuring 1.4 x 1 x 1 cm.  IMPRESSION: Known cancer.  RECOMMENDATION: Ultrasound-guided  core biopsies of the left breast 8 o'clock mass, the larger left breast 1:30 o'clock mass, and abnormal left axillary lymph node.  Ultrasound-guided core biopsies of the right breast subareolar mass and abnormal right axillary lymph node.  Stereotactic core biopsy of right breast calcifications.  I have discussed the findings and recommendations with the patient. Results were also provided in writing at the conclusion of the visit. If applicable, a reminder letter will be sent to the patient regarding the next appointment.  BI-RADS CATEGORY  6: Known biopsy-proven malignancy.   Electronically Signed   By: Abelardo Diesel M.D.   On: 12/06/2014 13:31   [place] Lt Breast Bx W Loc Dev 1st Lesion Img Bx Spec US Guide  12/10/2014   ADDENDUM REPORT: 12/10/2014 08:12  ADDENDUM: Pathology revealed grade II invasive mammary carcinoma in the left breast at 1:00, grade II invasive ductal carcinoma in the left breast at 8:00, and metastatic mammary carcinoma in the left axillary lymph node. This was found to be concordant by Dr. Robina Ade. Pathology was discussed with Lauren Buchanan, the patient's daughter, at the patient's request. The patient did well after the biopsy with slight oozing and tenderness at the biopsy sites. Post biopsy instructions and care were reviewed and her questions were answered. The patient is scheduled for [facility] on December 12, 2014. The patient was scheduled for a right stereotactic biopsy and a right breast and axillary lymph node biopsy on Monday, December 10, 2014, but the daughter would like to cancel those appointments at this time. She would like to consult with the physicians on December 12, 2014 prior to any additional biopsies. My number was provided for future questions and concerns.  Pathology results reported by Susa Raring RN, BSN on December 10, 2014.   Electronically Signed   By: Abelardo Diesel M.D.   On: 12/10/2014 08:12   12/10/2014   CLINICAL  DATA:  Abnormal left breast masses and left axillary lymph node for biopsy  EXAM: ULTRASOUND GUIDED LEFT BREAST CORE NEEDLE BIOPSY  COMPARISON:  Previous exam(s).  FINDINGS: I met with the patient and we discussed the procedure of ultrasound-guided biopsy, including  benefits and alternatives. We discussed the high likelihood of a successful procedure. We discussed the risks of the procedure, including infection, bleeding, tissue injury, clip migration, and inadequate sampling. Informed written consent was given. The usual time-out protocol was performed immediately prior to the procedure.  Using sterile technique and 2% Lidocaine as local anesthetic, under direct ultrasound visualization, a 14 gauge spring-loaded device was used to perform biopsy of left breast 1:30 o'clock larger mass using a lateral approach. At the conclusion of the procedure a coil tissue marker clip was deployed into the biopsy cavity. Follow up 2 view mammogram was performed and dictated separately.  Using sterile technique and 2% Lidocaine as local anesthetic, under direct ultrasound visualization, a 14 gauge spring-loaded device was used to perform biopsy of left breast 8 o'clock using a lateral approach. At the conclusion of the procedure a ribbon tissue marker clip was deployed into the biopsy cavity.  Using sterile technique and 2% Lidocaine as local anesthetic, under direct ultrasound visualization, a 14 gauge spring-loaded device was used to perform biopsy of abnormal left axillary lymph node using a lateral approach. At the conclusion of the procedure no tissue marker clip was deployed into the biopsy cavity.  IMPRESSION: Ultrasound guided biopsy of left breast masses and abnormal left axillary lymph node. No apparent complications.  Electronically Signed: By: Abelardo Diesel M.D. On: 12/06/2014 13:35   [place] Lt Breast Bx W Loc Dev Ea Add Lesion Img Bx Spec US Guide  12/10/2014   ADDENDUM REPORT: 12/10/2014 08:12  ADDENDUM: Pathology  revealed grade II invasive mammary carcinoma in the left breast at 1:00, grade II invasive ductal carcinoma in the left breast at 8:00, and metastatic mammary carcinoma in the left axillary lymph node. This was found to be concordant by Dr. Robina Ade. Pathology was discussed with Lauren Buchanan, the patient's daughter, at the patient's request. The patient did well after the biopsy with slight oozing and tenderness at the biopsy sites. Post biopsy instructions and care were reviewed and her questions were answered. The patient is scheduled for [facility] on December 12, 2014. The patient was scheduled for a right stereotactic biopsy and a right breast and axillary lymph node biopsy on Monday, December 10, 2014, but the daughter would like to cancel those appointments at this time. She would like to consult with the physicians on December 12, 2014 prior to any additional biopsies. My number was provided for future questions and concerns.  Pathology results reported by Susa Raring RN, BSN on December 10, 2014.   Electronically Signed   By: Abelardo Diesel M.D.   On: 12/10/2014 08:12   12/10/2014   CLINICAL DATA:  Abnormal left breast masses and left axillary lymph node for biopsy  EXAM: ULTRASOUND GUIDED LEFT BREAST CORE NEEDLE BIOPSY  COMPARISON:  Previous exam(s).  FINDINGS: I met with the patient and we discussed the procedure of ultrasound-guided biopsy, including benefits and alternatives. We discussed the high likelihood of a successful procedure. We discussed the risks of the procedure, including infection, bleeding, tissue injury, clip migration, and inadequate sampling. Informed written consent was given. The usual time-out protocol was performed immediately prior to the procedure.  Using sterile technique and 2% Lidocaine as local anesthetic, under direct ultrasound visualization, a 14 gauge spring-loaded device was used to perform biopsy of left breast 1:30 o'clock  larger mass using a lateral approach. At the conclusion of the procedure a coil tissue marker clip was deployed into the biopsy  cavity. Follow up 2 view mammogram was performed and dictated separately.  Using sterile technique and 2% Lidocaine as local anesthetic, under direct ultrasound visualization, a 14 gauge spring-loaded device was used to perform biopsy of left breast 8 o'clock using a lateral approach. At the conclusion of the procedure a ribbon tissue marker clip was deployed into the biopsy cavity.  Using sterile technique and 2% Lidocaine as local anesthetic, under direct ultrasound visualization, a 14 gauge spring-loaded device was used to perform biopsy of abnormal left axillary lymph node using a lateral approach. At the conclusion of the procedure no tissue marker clip was deployed into the biopsy cavity.  IMPRESSION: Ultrasound guided biopsy of left breast masses and abnormal left axillary lymph node. No apparent complications.  Electronically Signed: By: Abelardo Diesel M.D. On: 12/06/2014 13:35   [place] Lt Breast Bx W Loc Dev Ea Add Lesion Img Bx Spec US Guide  12/10/2014   ADDENDUM REPORT: 12/10/2014 08:12  ADDENDUM: Pathology revealed grade II invasive mammary carcinoma in the left breast at 1:00, grade II invasive ductal carcinoma in the left breast at 8:00, and metastatic mammary carcinoma in the left axillary lymph node. This was found to be concordant by Dr. Robina Ade. Pathology was discussed with Lauren Buchanan, the patient's daughter, at the patient's request. The patient did well after the biopsy with slight oozing and tenderness at the biopsy sites. Post biopsy instructions and care were reviewed and her questions were answered. The patient is scheduled for [facility] on December 12, 2014. The patient was scheduled for a right stereotactic biopsy and a right breast and axillary lymph node biopsy on Monday, December 10, 2014, but the daughter would like  to cancel those appointments at this time. She would like to consult with the physicians on December 12, 2014 prior to any additional biopsies. My number was provided for future questions and concerns.  Pathology results reported by Susa Raring RN, BSN on December 10, 2014.   Electronically Signed   By: Abelardo Diesel M.D.   On: 12/10/2014 08:12   12/10/2014   CLINICAL DATA:  Abnormal left breast masses and left axillary lymph node for biopsy  EXAM: ULTRASOUND GUIDED LEFT BREAST CORE NEEDLE BIOPSY  COMPARISON:  Previous exam(s).  FINDINGS: I met with the patient and we discussed the procedure of ultrasound-guided biopsy, including benefits and alternatives. We discussed the high likelihood of a successful procedure. We discussed the risks of the procedure, including infection, bleeding, tissue injury, clip migration, and inadequate sampling. Informed written consent was given. The usual time-out protocol was performed immediately prior to the procedure.  Using sterile technique and 2% Lidocaine as local anesthetic, under direct ultrasound visualization, a 14 gauge spring-loaded device was used to perform biopsy of left breast 1:30 o'clock larger mass using a lateral approach. At the conclusion of the procedure a coil tissue marker clip was deployed into the biopsy cavity. Follow up 2 view mammogram was performed and dictated separately.  Using sterile technique and 2% Lidocaine as local anesthetic, under direct ultrasound visualization, a 14 gauge spring-loaded device was used to perform biopsy of left breast 8 o'clock using a lateral approach. At the conclusion of the procedure a ribbon tissue marker clip was deployed into the biopsy cavity.  Using sterile technique and 2% Lidocaine as local anesthetic, under direct ultrasound visualization, a 14 gauge spring-loaded device was used to perform biopsy of abnormal left axillary lymph node using a lateral approach. At the  conclusion of the procedure no tissue marker  clip was deployed into the biopsy cavity.  IMPRESSION: Ultrasound guided biopsy of left breast masses and abnormal left axillary lymph node. No apparent complications.  Electronically Signed: By: Abelardo Diesel M.D. On: 12/06/2014 13:35       IMPRESSION:    Cancer of central portion of right female breast (Resolved)   12/04/2014 Initial Diagnosis Cancer of central portion of female breast   12/06/2014 Breast US 1.3 x 1.2 x 1.6 cm spiculated hypoechoic lesion at the left breast 130 o'clock 3 cm from nipple correlating to the mass seen in the upper-outer quadrant left breast. There is a second hypoechoic mass 1 cm medial to the mass at the 1:30 position    12/06/2014 Breast US measuring 0.9 x 0.5 x 0.8 cm. At the left breast 8 o'clock 2 cm from nipple, there is angulated spiculated mass measuring 1 x 1.3 x 0.7 cm correlating to the mass seen in the   lower medial left breast on mammogram.   12/06/2014 Breast US Ultrasound of the left axilla demonstrates an abnormal enlarged lymph node with fatty hilum replacement measuring 1.4 x 1 x 0.9 cm Jugular limit ultrasound of the right breast demonstrates a 2.9 x1.4 x 1.7 cm irregular hypoechoic mass in the     12/06/2014 Breast US subareolar right breast. Ultrasound of the right axilla demonstrate abnormal right axillary lymph node measuring 1.4 x 1 x 1 cm.   12/07/2014 Receptors her2 Right- ER/PR+, Her2 -, Left-ER/PR+,Her2-, Left LN- ER+,PR-, Her2-   12/07/2014 Initial Biopsy 1. Breast, left, needle core biopsy, mass, 1 o'clock - INVASIVE MAMMARY CARCINOMA. 2. Breast, left, needle core biopsy, mass, 8 o'clock - INVASIVE DUCTAL CARCINOMA. 3. Lymph node, needle/core biopsy, left - ONE LYMPH NODE POSITIVE FOR METASTATIC MAMM    The patient has complex diagnosis of bilateral breast cancer as noted above. It appears that she is a very good candidate for bilateral mastectomy from a oncologic standpoint given the extent of her disease. She has seen surgery and is felt also  to be a reasonable candidate for this procedure based on her performance status and comorbidities. The patient however is continuing to process the information regarding this new diagnosis. She is not sure what she would like to do exactly in terms of treatment management, especially with regards to surgery. She will require staging studies further.  I discussed with the patient that in the setting of definitive management aiming for cure, then radiation treatment would follow surgery. Radiation treatment would not be in any way a curative treatment on its own, with or without systemic treatment. If the patient undergoes bilateral mastectomy, then it appears that postmastectomy radiation treatment would be a reasonable consideration given the extent of her disease and positive node status currently. However, given the patient's age a final decision could be made after reviewing her final pathologic results and also depending on how aggressive she wants to be. Certainly I believe that local/regional control would be improved with postmastectomy radiation treatment but how important this improvement is in terms of her overall status could be discussed further.  All of the patient's questions were answered.   PLAN: I will follow the patient's case. We will see what her final decisions are and if she proceeds with surgery, what her ultimate surgical results are as well. The patient does present with a very complex case of bilateral breast cancer in an elderly lady. Her care will need to be very individualized.  She was provided a lot of information today and she will decide in the near future how she would best like to proceed.     ________________________________   Jodelle Gross, MD, PhD   **Disclaimer: This note was dictated with voice recognition software. Similar sounding words can inadvertently be transcribed and this note may contain transcription errors which may not have been corrected upon  publication of note.**

## 2014-12-14 NOTE — Telephone Encounter (Signed)
Please let pt know I enetered script and to call us with quesitons or symptoms

## 2014-12-14 NOTE — Telephone Encounter (Signed)
Informed daughter that I sent her prescription to her pharmacy.

## 2014-12-17 ENCOUNTER — Encounter: Payer: Self-pay | Admitting: General Practice

## 2014-12-17 ENCOUNTER — Telehealth: Payer: Self-pay | Admitting: *Deleted

## 2014-12-17 NOTE — Progress Notes (Signed)
[facility] Psychosocial Distress Screening Spiritual Care  Visited with Fantasy and her two daughters (one local, one in [place]) to introduce [facility] team/resources, and to review distress screen per protocol.  The patient scored a 3 on the Psychosocial Distress Thermometer which indicates mild distress. Also assessed for distress and other psychosocial needs.   ONCBCN DISTRESS SCREENING 12/17/2014  Screening Type Initial Screening  Distress experienced in past week (1-10) 3  Practical problem type Housing;Food  Emotional problem type Adjusting to illness  Information Concerns Type Lack of info about diagnosis;Lack of info about treatment  Physical Problem type Loss of appetitie;Skin dry/itchy  Referral to support programs Yes  Other Spiritual Care, counseling interns   Provided pastoral presence, reflective listening, normalization of feelings, and encouragement as pt shared about recent stressors.  Per pt, her husband died two years ago (cancer history), and she is preparing to move to [place] [employer] [place] (which will reduce the housing and food prep stress).  Additionally, her granddaughter is departing soon for [place] for [employer] work, which entails increase in worry and decrease in companionship.  Encouraged Spiritual Care and counseling interns as support resources through these transitions.  Follow up needed: No.  Will refer to [facility] with pt permission.    [place], [place]

## 2014-12-17 NOTE — Telephone Encounter (Signed)
Spoke with patient's daughter Lauren Buchanan.  She states the patient is doing well.  No questions or concerns at this time.  Confirmed follow up with Dr. Jana Hakim for 01/30/15 at 1030am.  Encouraged her to call with any needs or concerns.

## 2014-12-19 ENCOUNTER — Ambulatory Visit (HOSPITAL_COMMUNITY)
Admission: RE | Admit: 2014-12-19 | Discharge: 2014-12-19 | Disposition: A | Discharge status: Home / Self Care | Payer: Medicare Other | Source: Ambulatory Visit | Attending: Oncology | Admitting: Oncology

## 2014-12-19 DIAGNOSIS — Z79899 Other long term (current) drug therapy: Secondary | ICD-10-CM | POA: Diagnosis not present

## 2014-12-19 DIAGNOSIS — C50911 Malignant neoplasm of unspecified site of right female breast: Secondary | ICD-10-CM | POA: Diagnosis not present

## 2014-12-19 DIAGNOSIS — K5730 Diverticulosis of large intestine without perforation or abscess without bleeding: Secondary | ICD-10-CM | POA: Diagnosis not present

## 2014-12-19 DIAGNOSIS — R911 Solitary pulmonary nodule: Secondary | ICD-10-CM | POA: Insufficient documentation

## 2014-12-19 DIAGNOSIS — C50912 Malignant neoplasm of unspecified site of left female breast: Secondary | ICD-10-CM | POA: Insufficient documentation

## 2014-12-19 DIAGNOSIS — C50111 Malignant neoplasm of central portion of right female breast: Secondary | ICD-10-CM

## 2014-12-19 LAB — GLUCOSE, CAPILLARY: GLUCOSE-CAPILLARY: 89 mg/dL (ref 70–99)

## 2014-12-19 MED ORDER — FLUDEOXYGLUCOSE F - 18 (FDG) INJECTION
6.2000 | Freq: Once | INTRAVENOUS | Status: AC | PRN
  Administered 2014-12-19: 6.2 via INTRAVENOUS

## 2014-12-24 ENCOUNTER — Other Ambulatory Visit: Payer: Self-pay | Admitting: Oncology

## 2014-12-24 NOTE — Progress Notes (Unsigned)
Called [name], Garnell's daughter, who is her chosen contact person. I gave her the results of the PET scan. The PET scan shows not only the mass in the right breast but also a mass in the left breast with a suspicious lymph node. Both these masses were seen in mammography. What is really new about the scan is the T11 vertebral lesion. The patient is having some back pain.  I told [name] that normally we would biopsy this lesion to prove there is stage IV disease. We could then consider radiation since there are symptoms associated with it and since it is the only metastatic spot we see. [name] is going to discuss this with her mother. She will call us back and let us know if she is willing at least to meet with the radiation doctor to discuss this further.  She is see me again on April 4. The plan from a systemic point of view is to continue her systemic anti-estrogens.

## 2014-12-26 ENCOUNTER — Other Ambulatory Visit: Payer: Self-pay | Admitting: Oncology

## 2014-12-31 ENCOUNTER — Other Ambulatory Visit: Payer: Self-pay | Admitting: Internal Medicine

## 2014-12-31 ENCOUNTER — Telehealth: Payer: Self-pay | Admitting: *Deleted

## 2014-12-31 NOTE — Telephone Encounter (Signed)
Patient's daughter called to say her mother is just not feeling well.  She has breast cancer and has declined any treatment, and she is 81 years old.  Her main complaint is diarrhea which she has had for eight months or so for which she takes on Imodium a day without relief.  Advised her to take Imodium per package instructions and to see there PCP.  Advised her to keep her appointment next month with Dr. Jana Hakim to discuss the bony disease that was identified on PET scan about which Dr. Jana Hakim had called her.

## 2015-01-02 ENCOUNTER — Ambulatory Visit: Admission: RE | Admit: 2015-01-02 | Payer: Medicare Other | Source: Ambulatory Visit | Admitting: Radiation Oncology

## 2015-01-02 ENCOUNTER — Ambulatory Visit: Payer: Medicare Other

## 2015-01-10 NOTE — Progress Notes (Signed)
Location of Breast Cancer:Upper Outer Quadrant Left Breast Stage IIA 1 o'clock position  And metastatic mammary carcinoma in left axillary lymph node Histology per Pathology Report:  Diagnosis 12/06/14: 1. Breast, left, needle core biopsy, mass, 1 o'clock- INVASIVE MAMMARY CARCINOMA.- SEE COMMENT. 2. Breast, left, needle core biopsy, mass, 8 o'clock- INVASIVE DUCTAL CARCINOMA.- SEE COMMENT. 3. Lymph node, needle/core biopsy, left- ONE LYMPH NODE POSITIVE FOR METASTATIC MAMMARY CARCINOMA.-   Receptor Status: ER(  + 87% ), PR (+36%  ), Her2-neu ( neg Ki 67 9% )  Did patient present with symptoms (if so, please note symptoms) or was this found on screening mammography?: initially sen by Dermatologist  Right upper extremity    For squamous cell ca, bx;s  On region  Of right breast, and mid chest done on 11/21/14,  Then went and had mammogram and ultrasound,   Past/Anticipated interventions by surgeon, if any: Dr. Renelda Loma Ingram,MD  Note 12/12/14, patient not interested in surgery at this time f/u 2-3 weeks   Past/Anticipated interventions by medical oncology, if any: Chemotherapy : Dr. Jana Hakim   Lymphedema issues, if any:   Pain issues, if any:    SAFETY ISSUES: YES  Prior radiation? NO  Pacemaker/ICD?  YES   Possible current pregnancy? NO  Is the patient on methotrexate? NO  Current Complaints / other details: Widow,  Seen in breast Clinic 12/12/14,menarche age 81, 32st live birth age 64,G7,P4,post menopausal late 13's, No HRT  father died stomach cancer age 60,mother dies of MI age 52,but had been dx breast cancer in her 26's;  Allergies:     Rebecca Eaton, RN 01/10/2015,11:24 AM

## 2015-01-21 ENCOUNTER — Ambulatory Visit (INDEPENDENT_AMBULATORY_CARE_PROVIDER_SITE_OTHER): Payer: Medicare Other | Admitting: Internal Medicine

## 2015-01-21 VITALS — BP 132/72 | HR 78 | Ht 60.0 in | Wt 122.8 lb

## 2015-01-21 DIAGNOSIS — I442 Atrioventricular block, complete: Secondary | ICD-10-CM

## 2015-01-21 LAB — MDC_IDC_ENUM_SESS_TYPE_INCLINIC
Battery Impedance: 1500 Ohm
Date Time Interrogation Session: 20160404131958
Implantable Pulse Generator Model: 5826
Implantable Pulse Generator Serial Number: 2233979
Lead Channel Impedance Value: 370 Ohm
Lead Channel Pacing Threshold Amplitude: 0.75 V
Lead Channel Pacing Threshold Pulse Width: 0.4 ms
Lead Channel Pacing Threshold Pulse Width: 0.4 ms
Lead Channel Setting Pacing Pulse Width: 0.4 ms
MDC IDC MSMT BATTERY VOLTAGE: 2.8 V
MDC IDC MSMT LEADCHNL RA IMPEDANCE VALUE: 357 Ohm
MDC IDC MSMT LEADCHNL RA SENSING INTR AMPL: 1.6 mV
MDC IDC MSMT LEADCHNL RV PACING THRESHOLD AMPLITUDE: 1 V
MDC IDC SET LEADCHNL RA PACING AMPLITUDE: 2 V
MDC IDC SET LEADCHNL RV SENSING SENSITIVITY: 4 mV

## 2015-01-21 NOTE — Progress Notes (Signed)
Electrophysiology Office Note   Date:  01/21/2015   ID:  Lauren Buchanan, DOB January 04, 1925, MRN [id number]  PCP:  Joycelyn Man, MD   Primary Electrophysiologist: Thompson Grayer, MD    Chief Complaint  Patient presents with  . Follow-up    Complete AV block     History of Present Illness: Lauren Buchanan is a 81 y.o. female who presents today for electrophysiology evaluation.   Since I saw her last, she has been diagnosed with breast cancer.  She has declined surgery and is not very sure that she will consider either radiation or chemotherapy. She is reasonably active.  She still drives. Today, she denies symptoms of palpitations, chest pain, shortness of breath, orthopnea, PND, lower extremity edema, claudication, dizziness, presyncope, syncope, bleeding, or neurologic sequela. The patient is tolerating medications without difficulties and is otherwise without complaint today.    Past Medical History  Diagnosis Date  . DJD (degenerative joint disease)   . IBS (irritable bowel syndrome)   . Goiter   . Complete heart block 1999    s/p PPM by Dr Olevia Perches, most recent generator change 2009  . Renal cyst   . COPD (chronic obstructive pulmonary disease)   . Headache(784.0)   . Pacemaker   . Wears glasses   . Hiatal hernia   . Esophageal stricture   . Diverticulosis   . Asthma   . Pneumonia   . Giant cell arteritis   . Cancer of central portion of female breast 12/04/2014  . Cancer of central portion of female breast 12/04/2014  . Breast cancer 12/08/2014    Bilateral  . Chest pain   . GERD (gastroesophageal reflux disease)   . History of general anesthesia complication   . Heart murmur   . Hemorrhoids    Past Surgical History  Procedure Laterality Date  . Pacemaker insertion  1999    Gen change (SJM) by Dr Olevia Perches 2009  . Cholecystectomy    . Left lower lobectomy for brochiectasis  1950  . Hemorrhoidectomy    . Renal cyst excision    . Tonsillectomy    . Fracture  surgery      fx lt wrist  . Colonoscopy    . Artery biopsy Left 01/03/2013    Procedure: BIOPSY LEFT TEMPORAL ARTERY;  Surgeon: Ascencion Dike, MD;  Location: [facility];  Service: ENT;  Laterality: Left;  . Anal fissure repair    . Breast biopsy Right   . Rectal polypectomy    . Mouth surgery       Current Outpatient Prescriptions  Medication Sig Dispense Refill  . albuterol (PROVENTIL HFA;VENTOLIN HFA) 108 (90 BASE) MCG/ACT inhaler Inhale 2 puffs into the lungs every 6 (six) hours as needed for shortness of breath. 1 Inhaler 2  . anastrozole (ARIMIDEX) 1 MG tablet Take 1 tablet (1 mg total) by mouth daily. 30 tablet 3  . budesonide-formoterol (SYMBICORT) 160-4.5 MCG/ACT inhaler Inhale 2 puffs into the lungs 2 (two) times daily. 3 Inhaler 3  . Calcium Citrate-Vitamin D (CITRACAL PETITES/VITAMIN D) 200-250 MG-UNIT TABS Take 1 tablet by mouth daily.      . metoprolol succinate (TOPROL-XL) 100 MG 24 hr tablet TAKE 1/2 (ONE-HALF) TABLET BY MOUTH EVERY MORNING 30 tablet 4  . predniSONE (DELTASONE) 10 MG tablet Take 5 mg by mouth daily with breakfast.    . traMADol (ULTRAM) 50 MG tablet Take 50 mg by mouth every 6 (six) hours as needed (pain).     [place] Kitchen  vitamin C (ASCORBIC ACID) 500 MG tablet Take 500 mg by mouth daily.      . [DISCONTINUED] loratadine (CLARITIN) 10 MG tablet Take 10 mg by mouth daily.       No current facility-administered medications for this visit.    Allergies:   Morphine and related and Amoxicillin-pot clavulanate   Social History:  The patient  reports that she has been smoking.  She has never used smokeless tobacco. She reports that she does not use illicit drugs.   Family History:  The patient's family history includes Alcohol abuse in her father; Breast cancer in her mother; Heart disease in her mother; Seizures in her son; Stomach cancer in her father; Thyroid disease in her sister.    ROS:  Please see the history of present illness.   All other systems  are reviewed and negative.    PHYSICAL EXAM: VS:  BP 132/72 mmHg  Pulse 78  Ht 5' (1.524 m)  Wt 122 lb 12.8 oz (55.702 kg)  BMI 23.98 kg/m2 , BMI Body mass index is 23.98 kg/(m^2). GEN: Well nourished, well developed, in no acute distress HEENT: normal Neck: no JVD, carotid bruits, or masses Cardiac: RRR; no murmurs, rubs, or gallops,no edema  Respiratory:  clear to auscultation bilaterally, normal work of breathing GI: soft, nontender, nondistended, + BS MS: no deformity or atrophy Skin: warm and dry, device pocket is well healed Neuro:  Strength and sensation are intact Psych: euthymic mood, full affect  Device interrogation is reviewed today in detail.  See PaceArt for details.   Recent Labs: 12/12/2014: ALT 21; BUN 14.4; Creatinine 0.8; Hemoglobin 13.8; Platelets 238; Potassium 4.2; Sodium 139    Lipid Panel     Component Value Date/Time   CHOL 194 07/25/2007 0942   TRIG 69 07/25/2007 0942   HDL 62.7 07/25/2007 0942   CHOLHDL 3.1 CALC 07/25/2007 0942   VLDL 14 07/25/2007 0942   LDLCALC 118* 07/25/2007 0942     Wt Readings from Last 3 Encounters:  01/21/15 122 lb 12.8 oz (55.702 kg)  12/12/14 124 lb 1.6 oz (56.291 kg)  10/03/14 122 lb 6.4 oz (55.52 kg)      ASSESSMENT AND PLAN:  1.  Complete heart block Device dependant Normal pacemaker function See Pace Art report No changes today    Current medicines are reviewed at length with the patient today.   The patient does not have concerns regarding her medicines.  The following changes were made today:  none   Follow-up: return to the device clinic in 6 months, I will see again in a year  Signed, Thompson Grayer, MD  01/21/2015 12:48 PM     [address] [place] [id number] [phone] (office) [phone] (fax)

## 2015-01-21 NOTE — Patient Instructions (Addendum)
Your physician recommends that you schedule a follow-up appointment in: 6 months with device clinic and in 12 months with Dr.Allred

## 2015-01-23 ENCOUNTER — Ambulatory Visit
Admission: RE | Admit: 2015-01-23 | Discharge: 2015-01-23 | Disposition: A | Discharge status: Home / Self Care | Payer: Medicare Other | Source: Ambulatory Visit | Attending: Radiation Oncology | Admitting: Radiation Oncology

## 2015-01-23 ENCOUNTER — Ambulatory Visit: Payer: Medicare Other | Admitting: Radiation Oncology

## 2015-01-23 ENCOUNTER — Ambulatory Visit: Admission: RE | Admit: 2015-01-23 | Payer: Medicare Other | Source: Ambulatory Visit

## 2015-01-23 ENCOUNTER — Inpatient Hospital Stay: Admission: RE | Admit: 2015-01-23 | Payer: Medicare Other | Source: Ambulatory Visit | Admitting: Radiation Oncology

## 2015-01-23 ENCOUNTER — Ambulatory Visit: Payer: Medicare Other

## 2015-01-23 DIAGNOSIS — C7951 Secondary malignant neoplasm of bone: Secondary | ICD-10-CM | POA: Insufficient documentation

## 2015-01-25 ENCOUNTER — Encounter: Payer: Self-pay | Admitting: Internal Medicine

## 2015-01-28 ENCOUNTER — Telehealth: Payer: Self-pay | Admitting: Nurse Practitioner

## 2015-01-28 NOTE — Telephone Encounter (Signed)
pt cld to CX appt-r/s appt w/ HF-gave pt updated time & date

## 2015-01-30 ENCOUNTER — Inpatient Hospital Stay: Admission: RE | Admit: 2015-01-30 | Payer: Medicare Other | Source: Ambulatory Visit | Admitting: Radiation Oncology

## 2015-01-30 ENCOUNTER — Inpatient Hospital Stay: Admission: RE | Admit: 2015-01-30 | Payer: Medicare Other | Source: Ambulatory Visit

## 2015-01-30 ENCOUNTER — Institutional Professional Consult (permissible substitution): Payer: Medicare Other | Admitting: Radiation Oncology

## 2015-01-30 ENCOUNTER — Ambulatory Visit: Payer: Medicare Other | Admitting: Oncology

## 2015-02-06 ENCOUNTER — Telehealth: Payer: Self-pay | Admitting: Nurse Practitioner

## 2015-02-06 ENCOUNTER — Encounter: Payer: Self-pay | Admitting: Nurse Practitioner

## 2015-02-06 ENCOUNTER — Ambulatory Visit (HOSPITAL_BASED_OUTPATIENT_CLINIC_OR_DEPARTMENT_OTHER): Payer: Medicare Other | Admitting: Nurse Practitioner

## 2015-02-06 VITALS — BP 167/69 | HR 70 | Temp 97.9°F | Resp 18 | Ht 60.0 in | Wt 121.8 lb

## 2015-02-06 DIAGNOSIS — C792 Secondary malignant neoplasm of skin: Secondary | ICD-10-CM | POA: Diagnosis not present

## 2015-02-06 DIAGNOSIS — M8580 Other specified disorders of bone density and structure, unspecified site: Secondary | ICD-10-CM | POA: Diagnosis not present

## 2015-02-06 DIAGNOSIS — C50412 Malignant neoplasm of upper-outer quadrant of left female breast: Secondary | ICD-10-CM

## 2015-02-06 NOTE — Progress Notes (Signed)
[place]  [phone] [phone]     ID: Lauren Buchanan DOB: 1924-12-21  MR#: [id number]  OLI#:[id number]  Patient Care Team: Lauren Cookey, MD as PCP - General (Family Medicine) Lauren Skates, MD as Consulting Physician (General Surgery) Lauren Cruel, MD as Consulting Physician (Oncology) Lauren Rudd, MD as Consulting Physician (Radiation Oncology) Lauren Germany, RN as Registered Nurse Lauren Kaufmann, RN as Registered Nurse PCP: Lauren Man, MD GYN: SU:  OTHER MD: Lauren Sewer MD, Lauren Grayer MD, Lauren Bailey MD, Lauren Bookbinder MD  CHIEF COMPLAINT: bilateral estrogen receptor positive breast cancer  CURRENT TREATMENT: awaiting definitive surgery  NOTE: Patient requests her daughter Lauren Buchanan contacted regarding all appointments and procedures. Lauren Buchanan can be reached at [place] HISTORY: "Dot"  Has been aware of a sore right nipple for the last 6-8 months. She attributed it to her washing machine, which rubs that area when she uses it. On  11/21/2014 she saw Dr. 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 She mentioned the chest wall problem and Dr. Ubaldo Buchanan obtained to skin biopsies 1 from the right inferior central chest and the other one of from the right mid medial chest.  The second of these showed a nodular basal cell carcinoma. The first one however showed invasive carcinoma consistent with a breast primary. This tumor was estrogen receptor positive at 100%, progesterone receptor positive at 36%, both with strong staining intensity, and HER-2 was amplified , the signals ratio being 6.2. [name] [id number])  On 12/06/2014 the patient underwent bilateral diagnostic mammography with bilateral ultrasonography. Breast density was category B. There was bilateral nipple retraction. In the left breast upper outer quadrant there was a spiculated mass noted by mammography with a second spiculated mass in  the lower inner quadrant. By ultrasound there was a 1.6 cm hypoechoic lesion at the 1:30 o'clock position in the left breast, and a second mass more medially measuring 0.9 cm. Also at the 8:00 position in the left breast 2 cm from the nipple there was a 1.3 cm mass. Ultrasound of the left axilla showed an enlarged lymph node with fatty hilum replacement, measuring 1.4 cm.  In the right breast mammography showed a cluster of indeterminate microcalcifications in the upper outer quadrant and a retroareolar mass, which bile does sound measured 2.9 cm. Ultrasound of the right axilla showed a right axilla lymph node measuring 1.4 cm.  On 12/06/2014 the patient underwent needle core biopsy of 2 separate masses in the left breast (at 1:00 and 8:00) as well as the enlarged axillary lymph node. All 3 were positive for an invasive lobular carcinoma (E-cadherin negative). All 3 tumors were estrogen receptor positive with strong staining intensity (between 87% and 100%). The lymph node was progesterone receptor negative, the other 2 masses being progesterone receptor positive at 9% and at 76%. This cancer was reported to be HER-2 positive at conference today, though I do not have the ratios at hand.  The patient is not an MRI candidate because she has a permanent pacemaker in place. Her subsequent history is as detailed below   INTERVAL HISTORY: Dot returns today for follow up of her breast cancer, accompanied by one of her daughters. Since her last visit she started on anastrozole and she is tolerating it remarkably well. She "cant even tell she's taking it." [name] This showed a concerning lesion  to her T11 vertebra. She has no pain to this area.   REVIEW OF SYSTEMS: Dot denies fevers or chills. She is moving her bowels and bladder well. She has chronic rheumatoid arthritis and takes tramadol PRN . She denies shortness of breath, chest pain, cough, or palpitations. She  has some short term memory loss at times. She denies headaches, dizziness, weakness, or vision changes. A detailed review of systems is otherwise stable.   PAST MEDICAL HISTORY: Past Medical History  Diagnosis Date  . DJD (degenerative joint disease)   . IBS (irritable bowel syndrome)   . Goiter   . Complete heart block 1999    s/p PPM by Dr Lauren Buchanan, most recent generator change 2009  . Renal cyst   . COPD (chronic obstructive pulmonary disease)   . Headache(784.0)   . Pacemaker   . Wears glasses   . Hiatal hernia   . Esophageal stricture   . Diverticulosis   . Asthma   . Pneumonia   . Giant cell arteritis   . Cancer of central portion of female breast 12/04/2014  . Cancer of central portion of female breast 12/04/2014  . Breast cancer 12/08/2014    Bilateral  . Chest pain   . GERD (gastroesophageal reflux disease)   . History of general anesthesia complication   . Heart murmur   . Hemorrhoids     PAST SURGICAL HISTORY: Past Surgical History  Procedure Laterality Date  . Pacemaker insertion  1999    Gen change (SJM) by Dr Lauren Buchanan 2009  . Cholecystectomy    . Left lower lobectomy for brochiectasis  1950  . Hemorrhoidectomy    . Renal cyst excision    . Tonsillectomy    . Fracture surgery      fx lt wrist  . Colonoscopy    . Artery biopsy Left 01/03/2013    Procedure: BIOPSY LEFT TEMPORAL ARTERY;  Surgeon: Lauren Dike, MD;  Location: [facility];  Service: ENT;  Laterality: Left;  . Anal fissure repair    . Breast biopsy Right   . Rectal polypectomy    . Mouth surgery      FAMILY HISTORY Family History  Problem Relation Age of Onset  . Heart disease Mother   . Breast cancer Mother   . Stomach cancer Father   . Seizures Son   . Thyroid disease Sister   . Alcohol abuse Father    the patient's father died at the age of 23, with stomach cancer. The patient's mother died at the age of 90 from a myocardial infarction. She had been diagnosed with breast  cancer in her 17s. The patient had no brothers, 2 sisters. There is no other history of breast or ovarian cancer in the family to the patient's knowledge  GYNECOLOGIC HISTORY:  No LMP recorded. Patient is postmenopausal. Menarche age 34, first live birth age 57. The patient is GX P4. She underwent menopause in her late 18s. She did not take hormone replacement.  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 She is a widow, lives by herself, with no pets. Daughter Wendy Poet lives in [place] where she is Mudlogger of a preschool and day care. Son Marcela Alatorre lives in [place] where he owns a furniture to sign store. Daughter Shelly Rubenstein lives in [place]. She owns a Chiropractor. Son Randall Hiss is physically and mentally disabled and lives in a group home in [place]. The  patient has 7 grandchildren and 2 great-grandchildren. She attends a local [place]: In place; the patient's daughter [name] is her healthcare power of attorney. She can be reached at [place]: History  Substance Use Topics  . Smoking status: Current Every Day Smoker -- 0.25 packs/day  . Smokeless tobacco: Never Used  . Alcohol Use: Not on file     Comment: Occasional      Colonoscopy:  PAP:  Bone density:  Lipid panel:  Allergies  Allergen Reactions  . Morphine And Related Nausea Only  . Amoxicillin-Pot Clavulanate Other (See Comments)    Unknown     Current Outpatient Prescriptions  Medication Sig Dispense Refill  . anastrozole (ARIMIDEX) 1 MG tablet Take 1 tablet (1 mg total) by mouth daily. 30 tablet 3  . Calcium Citrate-Vitamin D (CITRACAL PETITES/VITAMIN D) 200-250 MG-UNIT TABS Take 1 tablet by mouth daily.      . metoprolol succinate (TOPROL-XL) 100 MG 24 hr tablet TAKE 1/2 (ONE-HALF) TABLET BY MOUTH EVERY MORNING 30 tablet 4  . predniSONE (DELTASONE) 5 MG tablet Take 5 mg by mouth daily with breakfast.    . Probiotic  Product (PROBIOTIC DAILY PO) Take 1 tablet by mouth daily. 1 Chew daily.    . traMADol (ULTRAM) 50 MG tablet Take 50 mg by mouth every 6 (six) hours as needed (pain).     . vitamin C (ASCORBIC ACID) 500 MG tablet Take 500 mg by mouth daily.      Marland Kitchen albuterol (PROVENTIL HFA;VENTOLIN HFA) 108 (90 BASE) MCG/ACT inhaler Inhale 2 puffs into the lungs every 6 (six) hours as needed for shortness of breath. (Patient not taking: Reported on 02/06/2015) 1 Inhaler 2  . budesonide-formoterol (SYMBICORT) 160-4.5 MCG/ACT inhaler Inhale 2 puffs into the lungs 2 (two) times daily. (Patient not taking: Reported on 02/06/2015) 3 Inhaler 3  . [DISCONTINUED] loratadine (CLARITIN) 10 MG tablet Take 10 mg by mouth daily.       No current facility-administered medications for this visit.    OBJECTIVE: Elderly white woman in no acute distress Filed Vitals:   02/06/15 0843  BP: 167/69  Pulse: 70  Temp: 97.9 F (36.6 C)  Resp: 18     Body mass index is 23.79 kg/(m^2).    ECOG FS:1 - Symptomatic but completely ambulatory  Skin: warm, dry  HEENT: sclerae anicteric, conjunctivae pink, oropharynx clear. No thrush or mucositis.  Lymph Nodes: No cervical or supraclavicular lymphadenopathy  Lungs: clear to auscultation bilaterally, no rales, wheezes, or rhonci  Heart: regular rate and rhythm  Abdomen: round, soft, non tender, positive bowel sounds  Musculoskeletal: No focal spinal tenderness, no peripheral edema  Neuro: non focal, well oriented, positive affect  Breasts; deferred   LAB RESULTS:  CMP     Component Value Date/Time   NA 139 12/12/2014 1149   NA 138 09/27/2014 1600   K 4.2 12/12/2014 1149   K 4.6 09/27/2014 1600   CL 98 09/27/2014 1600   CO2 30* 12/12/2014 1149   CO2 30 09/27/2014 1600   GLUCOSE 113 12/12/2014 1149   GLUCOSE 199* 09/27/2014 1600   BUN 14.4 12/12/2014 1149   BUN 9 09/27/2014 1600   CREATININE 0.8 12/12/2014 1149   CREATININE 0.62 09/27/2014 1600   CALCIUM 9.2 12/12/2014 1149    CALCIUM 9.7 09/27/2014 1600   PROT 6.5 12/12/2014 1149   PROT 6.9 09/27/2014 1600   ALBUMIN 3.5 12/12/2014 1149   ALBUMIN 3.7 09/27/2014  1600   AST 24 12/12/2014 1149   AST 30 09/27/2014 1600   ALT 21 12/12/2014 1149   ALT 27 09/27/2014 1600   ALKPHOS 84 12/12/2014 1149   ALKPHOS 66 09/27/2014 1600   BILITOT 1.05 12/12/2014 1149   BILITOT 0.5 09/27/2014 1600   GFRNONAA 79* 09/27/2014 1600   GFRAA >90 09/27/2014 1600    INo results found for: SPEP, UPEP  Lab Results  Component Value Date   WBC 8.2 12/12/2014   NEUTROABS 4.9 12/12/2014   HGB 13.8 12/12/2014   HCT 42.4 12/12/2014   MCV 92.4 12/12/2014   PLT 238 12/12/2014      Chemistry      Component Value Date/Time   NA 139 12/12/2014 1149   NA 138 09/27/2014 1600   K 4.2 12/12/2014 1149   K 4.6 09/27/2014 1600   CL 98 09/27/2014 1600   CO2 30* 12/12/2014 1149   CO2 30 09/27/2014 1600   BUN 14.4 12/12/2014 1149   BUN 9 09/27/2014 1600   CREATININE 0.8 12/12/2014 1149   CREATININE 0.62 09/27/2014 1600      Component Value Date/Time   CALCIUM 9.2 12/12/2014 1149   CALCIUM 9.7 09/27/2014 1600   ALKPHOS 84 12/12/2014 1149   ALKPHOS 66 09/27/2014 1600   AST 24 12/12/2014 1149   AST 30 09/27/2014 1600   ALT 21 12/12/2014 1149   ALT 27 09/27/2014 1600   BILITOT 1.05 12/12/2014 1149   BILITOT 0.5 09/27/2014 1600       No results found for: LABCA2  No components found for: LABCA125  No results for input(s): INR in the last 168 hours.  Urinalysis    Component Value Date/Time   COLORURINE YELLOW 09/27/2014 1800   APPEARANCEUR CLEAR 09/27/2014 1800   LABSPEC 1.005 09/27/2014 1800   PHURINE 7.0 09/27/2014 1800   GLUCOSEU NEGATIVE 09/27/2014 1800   HGBUR NEGATIVE 09/27/2014 1800   HGBUR trace-intact 01/17/2010 0932   BILIRUBINUR NEGATIVE 09/27/2014 1800   BILIRUBINUR n 09/01/2011 1157   KETONESUR NEGATIVE 09/27/2014 1800   PROTEINUR NEGATIVE 09/27/2014 1800   PROTEINUR n 09/01/2011 1157    UROBILINOGEN 0.2 09/27/2014 1800   UROBILINOGEN 0.2 09/01/2011 1157   NITRITE NEGATIVE 09/27/2014 1800   NITRITE n 09/01/2011 1157   LEUKOCYTESUR TRACE* 09/27/2014 1800    STUDIES: No results found.   CLINICAL DATA: Initial treatment strategy for bilateral breast carcinoma.  EXAM: NUCLEAR MEDICINE PET SKULL BASE TO THIGH  TECHNIQUE: 6.2 mCi F-18 FDG was injected intravenously. Full-ring PET imaging was performed from the skull base to thigh after the radiotracer. CT data was obtained and used for attenuation correction and anatomic localization.  FASTING BLOOD GLUCOSE: Value: 89 mg/dl  COMPARISON: CT abdomen pelvis 09/27/2014  FINDINGS: NECK  No hypermetabolic lymph nodes in the neck.  CHEST  Hypermetabolic mass in the right breast with SUV max 8.9. There are two adjacent hypermetabolic right axillary lymph nodes measuring 10 mm short axis with SUV max of 7.1.  There is a hypermetabolic mass in the left lateral breast with SUV 5.7. There is a hypermetabolic left axillary lymph node with SUV max 3.8.  No hypermetabolic supraclavicular or internal mammary lymph nodes. Within the superior segment left lower lobe there is a solid and ground-glass nodule measuring 2 . 3 cm with mild metabolic activity. There is calcified pleural thickening adjacent this to this nodule.No hypermetabolic mediastinal or hilar nodes. No suspicious pulmonary nodules on the CT scan.  ABDOMEN/PELVIS  No abnormal metabolic activity  liver. No hypermetabolic abdominal pelvic lymph nodes. Sigmoid diverticulosis  SKELETON  Single hypermetabolic focus within the T11 vertebral body with an irregular lucency at this level on CT exam (image 91, series 5). Minimal activity is intense with SUV max 7.9. No additional evidence skeletal metastasis  IMPRESSION: 1. Hypermetabolic right breast mass. Two hypermetabolic right axial lymph nodes. 2. Hypermetabolic left breast mass.  Single hypermetabolic left external lymph node. 3. Single foci of metabolic activity within the T11 vertebral is highly concerning for solitary skeletal metastasis. 4. Mildly hypermetabolic left lower lobe mixed solid and ground-glass nodule. Burtis Junes this to represent focus inflammation or infection. Recommend attention on routine follow-up.   Electronically Signed  By: Suzy Bouchard M.D.  On: 12/19/2014 15:46  ASSESSMENT: 81 y.o. [place] woman with bilateral breast cancer, as follows:  (1) status post right chest skin biopsy for a clinical T4 N1, stage IIIB invasive ductal carcinoma, estrogen and progesterone receptor positive, HER-2 amplified  (2) status post 2 separate left breast biopsies and one left axillary lymph node biopsy, all positive for a clinical mT1c N1, stage IIA invasive lobular carcinoma, estrogen receptor positive, progesterone receptor variable, with MIB-1 between 10 and 40%, and no HER-2 amplification  (3) definitive surgery to consist of bilateral mastectomies without formal axillary dissection  (4) adjuvant radiation depending on the final pathology results  (5) anti-estrogens to follow local treatment.  (6) consider anti-HER-2 treatment depending on the final pathology results  PLAN: Dot, her daughter, and I spent about 40 minutes reviewing the biology of cancer. They understand that at this time, her cancer is considered stage III. With the results of her recent PET, she may actually be stage IV because of the single lesion found to her T11 vertebra. To be sure of this, we would need to biopsy this area. If the biopsy results return a match for her breast cancer, we may be able to treat this area with radiation. At this time Daje is uninterested in this procedure and an MRI cannot be performed because of her pacemaker. She is tolerating the anastrozole well and will continue this alone. She does not have a bone density scan yet, so I have placed orders  for this to be performed at the [facility]. She is already on calcium and vitamin D supplements.   Dot will discuss this over with her family. She will return in 2 weeks to present her final decision to Dr. Jana Hakim. She understands the goal of treatment in her case is control. She has been encouraged to call with any issues that might arise before her next visit here.    Laurie Panda, NP   02/06/2015 9:36 AM

## 2015-02-06 NOTE — Telephone Encounter (Signed)
per pof to sch pt appt-sch DEXA-gave pt copy of sch

## 2015-02-06 NOTE — Addendum Note (Signed)
Addended by: Marcelino Duster on: 02/06/2015 06:26 PM   Modules accepted: Orders

## 2015-02-19 ENCOUNTER — Ambulatory Visit
Admission: RE | Admit: 2015-02-19 | Discharge: 2015-02-19 | Disposition: A | Discharge status: Home / Self Care | Payer: Medicare Other | Source: Ambulatory Visit | Attending: Nurse Practitioner | Admitting: Nurse Practitioner

## 2015-02-19 DIAGNOSIS — M810 Age-related osteoporosis without current pathological fracture: Secondary | ICD-10-CM | POA: Diagnosis not present

## 2015-02-19 DIAGNOSIS — M8580 Other specified disorders of bone density and structure, unspecified site: Secondary | ICD-10-CM

## 2015-02-20 ENCOUNTER — Inpatient Hospital Stay: Admission: RE | Admit: 2015-02-20 | Payer: Medicare Other | Source: Ambulatory Visit | Admitting: Radiation Oncology

## 2015-02-20 ENCOUNTER — Inpatient Hospital Stay: Admission: RE | Admit: 2015-02-20 | Payer: Medicare Other | Source: Ambulatory Visit

## 2015-02-25 ENCOUNTER — Ambulatory Visit: Payer: Medicare Other | Admitting: Pulmonary Disease

## 2015-02-27 ENCOUNTER — Ambulatory Visit (HOSPITAL_BASED_OUTPATIENT_CLINIC_OR_DEPARTMENT_OTHER): Payer: Medicare Other | Admitting: Oncology

## 2015-02-27 ENCOUNTER — Other Ambulatory Visit: Payer: Self-pay | Admitting: Oncology

## 2015-02-27 ENCOUNTER — Telehealth: Payer: Self-pay | Admitting: Oncology

## 2015-02-27 VITALS — BP 162/70 | HR 77 | Temp 97.7°F | Resp 18 | Ht 60.0 in | Wt 120.8 lb

## 2015-02-27 DIAGNOSIS — C50412 Malignant neoplasm of upper-outer quadrant of left female breast: Secondary | ICD-10-CM

## 2015-02-27 DIAGNOSIS — M810 Age-related osteoporosis without current pathological fracture: Secondary | ICD-10-CM

## 2015-02-27 DIAGNOSIS — M899 Disorder of bone, unspecified: Secondary | ICD-10-CM | POA: Diagnosis not present

## 2015-02-27 DIAGNOSIS — C773 Secondary and unspecified malignant neoplasm of axilla and upper limb lymph nodes: Secondary | ICD-10-CM | POA: Diagnosis not present

## 2015-02-27 DIAGNOSIS — C50512 Malignant neoplasm of lower-outer quadrant of left female breast: Secondary | ICD-10-CM

## 2015-02-27 NOTE — Progress Notes (Signed)
[place]  [phone] [phone]     ID: ALEANA FIFITA DOB: [id number]  MR#: [id number]  DGL#:[id number]  Patient Care Team: Dorena Cookey, MD as PCP - General (Family Medicine) Fanny Skates, MD as Consulting Physician (General Surgery) Chauncey Cruel, MD as Consulting Physician (Oncology) [name], MD as Consulting Physician (Radiation Oncology) Rockwell Germany, RN as Registered Nurse Mauro Kaufmann, RN as Registered Nurse PCP: Joycelyn Man, MD GYN: SU:  OTHER MD: Danton Sewer MD, Thompson Grayer MD, Unice Bailey MD, Rolm Bookbinder MD  CHIEF COMPLAINT: bilateral estrogen receptor positive breast cancer, possibly metastatic  CURRENT TREATMENT: Anastrozole  NOTE: Patient requests her daughter Tonye Royalty contacted regarding all appointments and procedures. [name] can be reached at [place]: From the original intake note:  "Dot"  Has been aware of a sore right nipple for the last 6-8 months. She attributed it to her washing machine, which rubs that area when she uses it. On  11/21/2014 she saw Dr. 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 She mentioned the chest wall problem and Dr. Ubaldo Glassing obtained to skin biopsies 1 from the right inferior central chest and the other one of from the right mid medial chest.  The second of these showed a nodular basal cell carcinoma. The first one however showed invasive carcinoma consistent with a breast primary. This tumor was estrogen receptor positive at 100%, progesterone receptor positive at 36%, both with strong staining intensity, and HER-2 was amplified , the signals ratio being 6.2. [name] [id number])  On 12/06/2014 the patient underwent bilateral diagnostic mammography with bilateral ultrasonography. Breast density was category B. There was bilateral nipple retraction. In the left breast upper outer quadrant there was a spiculated mass noted by  mammography with a second spiculated mass in the lower inner quadrant. By ultrasound there was a 1.6 cm hypoechoic lesion at the 1:30 o'clock position in the left breast, and a second mass more medially measuring 0.9 cm. Also at the 8:00 position in the left breast 2 cm from the nipple there was a 1.3 cm mass. Ultrasound of the left axilla showed an enlarged lymph node with fatty hilum replacement, measuring 1.4 cm.  In the right breast mammography showed a cluster of indeterminate microcalcifications in the upper outer quadrant and a retroareolar mass, which bile does sound measured 2.9 cm. Ultrasound of the right axilla showed a right axilla lymph node measuring 1.4 cm.  On 12/06/2014 the patient underwent needle core biopsy of 2 separate masses in the left breast (at 1:00 and 8:00) as well as the enlarged axillary lymph node. All 3 were positive for an invasive lobular carcinoma (E-cadherin negative). All 3 tumors were estrogen receptor positive with strong staining intensity (between 87% and 100%). The lymph node was progesterone receptor negative, the other 2 masses being progesterone receptor positive at 9% and at 76%. This cancer was reported to be HER-2 positive at conference today, though I do not have the ratios at hand.  The patient is not an MRI candidate because she has a permanent pacemaker in place. Her subsequent history is as detailed below   INTERVAL HISTORY: Dot returns today for follow up of her breast cancer, accompanied by her daughter [name]. Deberah Castle has been on anastrozole now 2-1/2 months. She is generally tolerating it well. Hot flashes are not a problem. She does have mild vaginal dryness and a little bit of itching. She  has not developed the arthritis or myalgias that some patients can experience on this medication. She obtains it at about $5 a month.  REVIEW OF SYSTEMS: Since her last visit here Dot [facility]. She really is enjoying that. She has discovered some  Vicodin that her husband had left over and is taking that every morning. This gives her a little bit more energy. She does not take it for pain. She does have discomfort though in the left breast particularly. She is not using her inhalers now on his breathing just fine without them, she says. She is having some constipation/diarrhea alternations. She has some forgetfulness, which is not worse than before. A detailed review of systems today was otherwise stable   PAST MEDICAL HISTORY: Past Medical History  Diagnosis Date  . DJD (degenerative joint disease)   . IBS (irritable bowel syndrome)   . Goiter   . Complete heart block 1999    s/p PPM by Dr Olevia Perches, most recent generator change 2009  . Renal cyst   . COPD (chronic obstructive pulmonary disease)   . Headache(784.0)   . Pacemaker   . Wears glasses   . Hiatal hernia   . Esophageal stricture   . Diverticulosis   . Asthma   . Pneumonia   . Giant cell arteritis   . Cancer of central portion of female breast 12/04/2014  . Cancer of central portion of female breast 12/04/2014  . Breast cancer 12/08/2014    Bilateral  . Chest pain   . GERD (gastroesophageal reflux disease)   . History of general anesthesia complication   . Heart murmur   . Hemorrhoids     PAST SURGICAL HISTORY: Past Surgical History  Procedure Laterality Date  . Pacemaker insertion  1999    Gen change (SJM) by Dr Olevia Perches 2009  . Cholecystectomy    . Left lower lobectomy for brochiectasis  1950  . Hemorrhoidectomy    . Renal cyst excision    . Tonsillectomy    . Fracture surgery      fx lt wrist  . Colonoscopy    . Artery biopsy Left 01/03/2013    Procedure: BIOPSY LEFT TEMPORAL ARTERY;  Surgeon: Ascencion Dike, MD;  Location: [facility];  Service: ENT;  Laterality: Left;  . Anal fissure repair    . Breast biopsy Right   . Rectal polypectomy    . Mouth surgery      FAMILY HISTORY Family History  Problem Relation Age of Onset  . Heart disease  Mother   . Breast cancer Mother   . Stomach cancer Father   . Seizures Son   . Thyroid disease Sister   . Alcohol abuse Father    the patient's father died at the age of 77, with stomach cancer. The patient's mother died at the age of 46 from a myocardial infarction. She had been diagnosed with breast cancer in her 69s. The patient had no brothers, 2 sisters. There is no other history of breast or ovarian cancer in the family to the patient's knowledge  GYNECOLOGIC HISTORY:  No LMP recorded. Patient is postmenopausal. Menarche age 73, first live birth age 73. The patient is GX P4. She underwent menopause in her late 61s. She did not take hormone replacement.  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 She is a widow, lives by herself, with no pets. Daughter Wendy Poet lives in [place]  where she is Mudlogger of a preschool and day care. Son Tanequa Kretz lives in [place] where he owns a furniture to sign store. Daughter Shelly Rubenstein lives in [place]. She owns a Chiropractor. Son Randall Hiss is physically and mentally disabled and lives in a group home in [place]. The patient has 7 grandchildren and 2 great-grandchildren. She attends a local [employer]: In place; the patient's daughter [name] is her healthcare power of attorney. She can be reached at [place]: History  Substance Use Topics  . Smoking status: Current Every Day Smoker -- 0.25 packs/day  . Smokeless tobacco: Never Used  . Alcohol Use: Not on file     Comment: Occasional      Colonoscopy:  PAP:  Bone density:  Lipid panel:  Allergies  Allergen Reactions  . Morphine And Related Nausea Only  . Amoxicillin-Pot Clavulanate Other (See Comments)    Unknown     Current Outpatient Prescriptions  Medication Sig Dispense Refill  . albuterol (PROVENTIL HFA;VENTOLIN HFA) 108 (90 BASE) MCG/ACT inhaler Inhale 2 puffs into the lungs every 6 (six)  hours as needed for shortness of breath. (Patient not taking: Reported on 02/06/2015) 1 Inhaler 2  . anastrozole (ARIMIDEX) 1 MG tablet Take 1 tablet (1 mg total) by mouth daily. 30 tablet 3  . budesonide-formoterol (SYMBICORT) 160-4.5 MCG/ACT inhaler Inhale 2 puffs into the lungs 2 (two) times daily. (Patient not taking: Reported on 02/06/2015) 3 Inhaler 3  . Calcium Citrate-Vitamin D (CITRACAL PETITES/VITAMIN D) 200-250 MG-UNIT TABS Take 1 tablet by mouth daily.      . metoprolol succinate (TOPROL-XL) 100 MG 24 hr tablet TAKE 1/2 (ONE-HALF) TABLET BY MOUTH EVERY MORNING 30 tablet 4  . predniSONE (DELTASONE) 5 MG tablet Take 5 mg by mouth daily with breakfast.    . Probiotic Product (PROBIOTIC DAILY PO) Take 1 tablet by mouth daily. 1 Chew daily.    . traMADol (ULTRAM) 50 MG tablet Take 50 mg by mouth every 6 (six) hours as needed (pain).     . TURMERIC PO Take 1 capsule by mouth daily.    . vitamin C (ASCORBIC ACID) 500 MG tablet Take 500 mg by mouth daily.      . [DISCONTINUED] loratadine (CLARITIN) 10 MG tablet Take 10 mg by mouth daily.       No current facility-administered medications for this visit.    OBJECTIVE: Elderly white woman who appears stated age 81 Vitals:   02/27/15 0839  BP: 162/70  Pulse: 77  Temp: 97.7 F (36.5 C)  Resp: 18     Body mass index is 23.59 kg/(m^2).    ECOG FS:1 - Symptomatic but completely ambulatory  Sclerae unicteric, pupils round and equal Oropharynx clear and moist-- no thrush or other lesions No cervical or supraclavicular adenopathy Lungs no rales or rhonchi Heart regular rate and rhythm Abd soft, nontender, positive bowel sounds MSK no focal spinal tenderness and particularly no tenderness over the lower thoracic spine area, no upper extremity lymphedema Neuro: nonfocal, well oriented, positive affect Breasts: There are palpable masses in both breasts, most easily notable in the left breast lateral to the nipple. There is some distortion of  the left breast can to her as well. I do not palpate axillary adenopathy bilaterally.  LAB RESULTS:  CMP     Component Value Date/Time   NA 139 12/12/2014 1149   NA 138 09/27/2014 1600   K 4.2 12/12/2014 1149  K 4.6 09/27/2014 1600   CL 98 09/27/2014 1600   CO2 30* 12/12/2014 1149   CO2 30 09/27/2014 1600   GLUCOSE 113 12/12/2014 1149   GLUCOSE 199* 09/27/2014 1600   BUN 14.4 12/12/2014 1149   BUN 9 09/27/2014 1600   CREATININE 0.8 12/12/2014 1149   CREATININE 0.62 09/27/2014 1600   CALCIUM 9.2 12/12/2014 1149   CALCIUM 9.7 09/27/2014 1600   PROT 6.5 12/12/2014 1149   PROT 6.9 09/27/2014 1600   ALBUMIN 3.5 12/12/2014 1149   ALBUMIN 3.7 09/27/2014 1600   AST 24 12/12/2014 1149   AST 30 09/27/2014 1600   ALT 21 12/12/2014 1149   ALT 27 09/27/2014 1600   ALKPHOS 84 12/12/2014 1149   ALKPHOS 66 09/27/2014 1600   BILITOT 1.05 12/12/2014 1149   BILITOT 0.5 09/27/2014 1600   GFRNONAA 79* 09/27/2014 1600   GFRAA >90 09/27/2014 1600    INo results found for: SPEP, UPEP  Lab Results  Component Value Date   WBC 8.2 12/12/2014   NEUTROABS 4.9 12/12/2014   HGB 13.8 12/12/2014   HCT 42.4 12/12/2014   MCV 92.4 12/12/2014   PLT 238 12/12/2014      Chemistry      Component Value Date/Time   NA 139 12/12/2014 1149   NA 138 09/27/2014 1600   K 4.2 12/12/2014 1149   K 4.6 09/27/2014 1600   CL 98 09/27/2014 1600   CO2 30* 12/12/2014 1149   CO2 30 09/27/2014 1600   BUN 14.4 12/12/2014 1149   BUN 9 09/27/2014 1600   CREATININE 0.8 12/12/2014 1149   CREATININE 0.62 09/27/2014 1600      Component Value Date/Time   CALCIUM 9.2 12/12/2014 1149   CALCIUM 9.7 09/27/2014 1600   ALKPHOS 84 12/12/2014 1149   ALKPHOS 66 09/27/2014 1600   AST 24 12/12/2014 1149   AST 30 09/27/2014 1600   ALT 21 12/12/2014 1149   ALT 27 09/27/2014 1600   BILITOT 1.05 12/12/2014 1149   BILITOT 0.5 09/27/2014 1600       No results found for: LABCA2  No components found for:  LABCA125  No results for input(s): INR in the last 168 hours.  Urinalysis    Component Value Date/Time   COLORURINE YELLOW 09/27/2014 1800   APPEARANCEUR CLEAR 09/27/2014 1800   LABSPEC 1.005 09/27/2014 1800   PHURINE 7.0 09/27/2014 1800   GLUCOSEU NEGATIVE 09/27/2014 1800   HGBUR NEGATIVE 09/27/2014 1800   HGBUR trace-intact 01/17/2010 0932   BILIRUBINUR NEGATIVE 09/27/2014 1800   BILIRUBINUR n 09/01/2011 1157   KETONESUR NEGATIVE 09/27/2014 1800   PROTEINUR NEGATIVE 09/27/2014 1800   PROTEINUR n 09/01/2011 1157   UROBILINOGEN 0.2 09/27/2014 1800   UROBILINOGEN 0.2 09/01/2011 1157   NITRITE NEGATIVE 09/27/2014 1800   NITRITE n 09/01/2011 1157   LEUKOCYTESUR TRACE* 09/27/2014 1800    STUDIES: Dg Bone Density  02/19/2015   CLINICAL DATA:  Postmenopausal. Past medical history of breast carcinoma.  EXAM: DUAL X-RAY ABSORPTIOMETRY (DXA) FOR BONE MINERAL DENSITY  FINDINGS: AP LUMBAR SPINE L1 through L4  Bone Mineral Density (BMD):  0.756 g/cm2  Young Adult T-Score:  -2.6  LEFT FEMUR NECK  Bone Mineral Density (BMD):  0.564 g/cm2  Young Adult T-Score: -2.6  ASSESSMENT: Patient's diagnostic category is OSTEOPOROSIS by WHO Criteria.  FRACTURE RISK: Increased  FRAX:  Not assessed, some T-scores at or below -2.5.  COMPARISON: None.  Effective therapies are available in the form of bisphosphonates, selective estrogen receptor modulators, biologic  agents, and hormone replacement therapy (for women). All patients should ensure an adequate intake of dietary calcium (1200 mg daily) and vitamin D (800 IU daily) unless contraindicated.  All treatment decisions require clinical judgment and consideration of individual patient factors, including patient preferences, co-morbidities, previous drug use, risk factors not captured in the FRAX model (e.g., frailty, falls, vitamin D deficiency, increased bone turnover, interval significant decline in bone density) and possible under- or over-estimation of fracture  risk by FRAX.  The National Osteoporosis Foundation recommends that FDA-approved medical therapies be considered in postmenopausal women and men age 23 or older with a:  1. Hip or vertebral (clinical or morphometric) fracture.  2. T-score of -2.5 or lower at the spine or hip.  3. Ten-year fracture probability by FRAX of 3% or greater for hip fracture or 20% or greater for major osteoporotic fracture.  People with diagnosed cases of osteoporosis or at high risk for fracture should have regular bone mineral density tests. For patients eligible for Medicare, routine testing is allowed once every 2 years. The testing frequency can be increased to one year for patients who have rapidly progressing disease, those who are receiving or discontinuing medical therapy to restore bone mass, or have additional risk factors.  World Pharmacologist [facility]) Criteria:  Normal: T-scores from +1.0 to -1.0  Low Bone Mass (Osteopenia): T-scores between -1.0 and -2.5  Osteoporosis: T-scores -2.5 and below  Comparison to Reference Population:  T-score is the key measure used in the diagnosis of osteoporosis and relative risk determination for fracture. It provides a value for bone mass relative to the mean bone mass of a young adult reference population expressed in terms of standard deviation (SD).  Z-score is the age-matched score showing the patient's values compared to a population matched for age, sex, and race. This is also expressed in terms of standard deviation. The patient may have values that compare favorably to the age-matched values and still be at increased risk for fracture.   Electronically Signed   By: Lajean Manes M.D.   On: 02/19/2015 16:32     ASSESSMENT: 81 y.o. [place] woman with bilateral breast cancer, as follows:  (1) status post right chest skin biopsy 11/21/2014 for a clinical T4 N1, stage IIIB invasive ductal carcinoma, estrogen and progesterone receptor positive, HER-2 amplified  (2) status post  2 separate left breast biopsies and one left axillary lymph node biopsy 12/04/2014, all positive for a clinical mT1c N1, stage IIA invasive lobular carcinoma, estrogen receptor positive, progesterone receptor variable, with MIB-1 between 10 and 40%, and no HER-2 amplification  (3) neoadjuvant anastrozole started 12/14/2014  (4) definitive surgery to consist of bilateral mastectomies without formal axillary dissection  (5) adjuvant radiation depending on the final pathology results  (6) consider anti-HER-2 treatment depending on the final pathology results  (7) osteoporosis with T score of -2.6 on bone scan obtained 02/19/2015.  (7) PET scan 12/19/2014 shows in addition to the bilateral breast and bilateral axillary masses, a lesion at the T11 vertebral body, showing an irregular lucency on prior CT exam  PLAN: Dot has been on anastrozole now approximately 3 months. We really need to find out if this is working for her or not. Probably the easiest way to do that is to repeat her bilateral ultrasounds. I am sending her [facility] with that in mind.  If we see at least disease control, then we could continue with this treatment. If we see evidence of gross  we will have to reconsider the bilateral mastectomy option, which she continues to be very reluctant to think about  She is now amenable to biopsy of the T11 lesion. This will be set up for next week.  Given the osteoporosis and the possibility of bone metastases, we discussed zolendronate in detail. She has a good understanding of the possible toxicities, side effects and complications of this agent including the rare case of osteonecrosis of the jaw. We are scheduling it for the first day of June. She will see me that same day. She'll also see her dentist a before then.  Even though she is using Vicodin and a very limited an appropriate fashion, I suggested she try to Aleve with breakfast and see if that has the same result for  her.  They will call with any problems that may develop before her next visit here.   Chauncey Cruel, MD   02/27/2015 9:18 AM

## 2015-02-27 NOTE — Telephone Encounter (Signed)
Gave and printed appt sched and avs for pt for May and June  °

## 2015-02-28 ENCOUNTER — Other Ambulatory Visit: Payer: Self-pay | Admitting: Oncology

## 2015-02-28 DIAGNOSIS — C50919 Malignant neoplasm of unspecified site of unspecified female breast: Secondary | ICD-10-CM

## 2015-03-05 ENCOUNTER — Ambulatory Visit (HOSPITAL_COMMUNITY): Payer: Medicare Other

## 2015-03-05 ENCOUNTER — Other Ambulatory Visit: Payer: Self-pay | Admitting: Radiology

## 2015-03-06 ENCOUNTER — Other Ambulatory Visit: Payer: Self-pay | Admitting: Radiology

## 2015-03-07 ENCOUNTER — Ambulatory Visit: Payer: Medicare Other | Admitting: Pulmonary Disease

## 2015-03-07 ENCOUNTER — Inpatient Hospital Stay (HOSPITAL_COMMUNITY): Admission: RE | Admit: 2015-03-07 | Payer: Medicare Other | Source: Ambulatory Visit

## 2015-03-07 ENCOUNTER — Ambulatory Visit (HOSPITAL_COMMUNITY)
Admission: RE | Admit: 2015-03-07 | Discharge: 2015-03-07 | Disposition: A | Discharge status: Home / Self Care | Payer: Medicare Other | Source: Ambulatory Visit | Attending: Oncology | Admitting: Oncology

## 2015-03-07 ENCOUNTER — Encounter (HOSPITAL_COMMUNITY): Payer: Self-pay

## 2015-03-07 ENCOUNTER — Ambulatory Visit (HOSPITAL_COMMUNITY): Admission: RE | Admit: 2015-03-07 | Payer: Medicare Other | Source: Ambulatory Visit

## 2015-03-07 DIAGNOSIS — C412 Malignant neoplasm of vertebral column: Secondary | ICD-10-CM | POA: Diagnosis not present

## 2015-03-07 DIAGNOSIS — M899 Disorder of bone, unspecified: Secondary | ICD-10-CM | POA: Diagnosis not present

## 2015-03-07 DIAGNOSIS — C50412 Malignant neoplasm of upper-outer quadrant of left female breast: Secondary | ICD-10-CM

## 2015-03-07 DIAGNOSIS — C7951 Secondary malignant neoplasm of bone: Secondary | ICD-10-CM | POA: Diagnosis not present

## 2015-03-07 DIAGNOSIS — C50919 Malignant neoplasm of unspecified site of unspecified female breast: Secondary | ICD-10-CM | POA: Diagnosis not present

## 2015-03-07 LAB — CBC WITH DIFFERENTIAL/PLATELET
BASOS PCT: 0 % (ref 0–1)
Basophils Absolute: 0 10*3/uL (ref 0.0–0.1)
EOS ABS: 0.3 10*3/uL (ref 0.0–0.7)
Eosinophils Relative: 3 % (ref 0–5)
HEMATOCRIT: 41.7 % (ref 36.0–46.0)
Hemoglobin: 13.4 g/dL (ref 12.0–15.0)
Lymphocytes Relative: 27 % (ref 12–46)
Lymphs Abs: 2.4 10*3/uL (ref 0.7–4.0)
MCH: 29.6 pg (ref 26.0–34.0)
MCHC: 32.1 g/dL (ref 30.0–36.0)
MCV: 92.1 fL (ref 78.0–100.0)
MONOS PCT: 11 % (ref 3–12)
Monocytes Absolute: 1 10*3/uL (ref 0.1–1.0)
NEUTROS ABS: 5.2 10*3/uL (ref 1.7–7.7)
NEUTROS PCT: 59 % (ref 43–77)
Platelets: 272 10*3/uL (ref 150–400)
RBC: 4.53 MIL/uL (ref 3.87–5.11)
RDW: 13.6 % (ref 11.5–15.5)
WBC: 8.9 10*3/uL (ref 4.0–10.5)

## 2015-03-07 LAB — BASIC METABOLIC PANEL
Anion gap: 6 (ref 5–15)
BUN: 14 mg/dL (ref 6–20)
CO2: 28 mmol/L (ref 22–32)
CREATININE: 0.56 mg/dL (ref 0.44–1.00)
Calcium: 9 mg/dL (ref 8.9–10.3)
Chloride: 104 mmol/L (ref 101–111)
GFR calc non Af Amer: >60 mL/min (ref >60)
Glucose, Bld: 79 mg/dL (ref 65–99)
Potassium: 4.2 mmol/L (ref 3.5–5.1)
Sodium: 138 mmol/L (ref 135–145)

## 2015-03-07 LAB — APTT: aPTT: 25 seconds (ref 24–37)

## 2015-03-07 LAB — PROTIME-INR
INR: 0.91 (ref 0.00–1.49)
Prothrombin Time: 12.4 seconds (ref 11.6–15.2)

## 2015-03-07 MED ORDER — FENTANYL CITRATE (PF) 100 MCG/2ML IJ SOLN
INTRAMUSCULAR | Status: AC | PRN
  Administered 2015-03-07: 25 ug via INTRAVENOUS

## 2015-03-07 MED ORDER — FENTANYL CITRATE (PF) 100 MCG/2ML IJ SOLN
INTRAMUSCULAR | Status: AC
  Filled 2015-03-07: qty 2

## 2015-03-07 MED ORDER — MIDAZOLAM HCL 2 MG/2ML IJ SOLN
INTRAMUSCULAR | Status: AC
Start: 2015-03-07 — End: 2015-03-07
  Filled 2015-03-07: qty 4

## 2015-03-07 MED ORDER — MIDAZOLAM HCL 2 MG/2ML IJ SOLN
INTRAMUSCULAR | Status: AC | PRN
  Administered 2015-03-07 (×3): 0.5 mg via INTRAVENOUS

## 2015-03-07 MED ORDER — SODIUM CHLORIDE 0.9 % IV SOLN
INTRAVENOUS | Status: DC
  Administered 2015-03-07: 10:00:00 via INTRAVENOUS

## 2015-03-07 NOTE — Discharge Instructions (Signed)
Conscious Sedation, Adult, Care After °Refer to this sheet in the next few weeks. These instructions provide you with information on caring for yourself after your procedure. Your health care provider may also give you more specific instructions. Your treatment has been planned according to current medical practices, but problems sometimes occur. Call your health care provider if you have any problems or questions after your procedure. °WHAT TO EXPECT AFTER THE PROCEDURE  °After your procedure: °· You may feel sleepy, clumsy, and have poor balance for several hours. °· Vomiting may occur if you eat too soon after the procedure. °HOME CARE INSTRUCTIONS °· Do not participate in any activities where you could become injured for at least 24 hours. Do not: °¨ Drive. °¨ Swim. °¨ Ride a bicycle. °¨ Operate heavy machinery. °¨ Cook. °¨ Use power tools. °¨ Climb ladders. °¨ Work from a high place. °· Do not make important decisions or sign legal documents until you are improved. °· If you vomit, drink water, juice, or soup when you can drink without vomiting. Make sure you have little or no nausea before eating solid foods. °· Only take over-the-counter or prescription medicines for pain, discomfort, or fever as directed by your health care provider. °· Make sure you and your family fully understand everything about the medicines given to you, including what side effects may occur. °· You should not drink alcohol, take sleeping pills, or take medicines that cause drowsiness for at least 24 hours. °· If you smoke, do not smoke without supervision. °· If you are feeling better, you may resume normal activities 24 hours after you were sedated. °· Keep all appointments with your health care provider. °SEEK MEDICAL CARE IF: °· Your skin is pale or bluish in color. °· You continue to feel nauseous or vomit. °· Your pain is getting worse and is not helped by medicine. °· You have bleeding or swelling. °· You are still sleepy or  feeling clumsy after 24 hours. °SEEK IMMEDIATE MEDICAL CARE IF: °· You develop a rash. °· You have difficulty breathing. °· You develop any type of allergic problem. °· You have a fever. °MAKE SURE YOU: °· Understand these instructions. °· Will watch your condition. °· Will get help right away if you are not doing well or get worse. °Document Released: 07/26/2013 Document Reviewed: 07/26/2013 °ExitCare® Patient Information ©2015 [employer]. This information is not intended to replace advice given to you by your health care provider. Make sure you discuss any questions you have with your health care provider. °Bone Biopsy, Needle, Care After °Read the instructions outlined below and refer to this sheet in the next few weeks. These discharge instructions provide you with general information on caring for yourself after you leave the hospital. Your caregiver may also give you specific instructions. While your treatment has been planned according to the most current medical practices available, unavoidable complications sometimes occur. If you have any problems or questions after discharge, call your caregiver. °Finding out the results of your test °Not all test results are available during your visit. If your test results are not back during the visit, make an appointment with your caregiver to find out the results. Do not assume everything is normal if you have not heard from your caregiver or the medical facility. It is important for you to follow up on all of your test results.  °SEEK MEDICAL CARE IF:  °· You have redness, swelling, or increasing pain at the site of the biopsy. °·   the biopsy.  You have pus coming from the biopsy site.  You have drainage from the biopsy site lasting longer than 1 day.  You notice a bad smell coming from the biopsy site or dressing.  You develop persistent nausea or vomiting. SEEK IMMEDIATE MEDICAL CARE IF:  You have a fever.  You develop a rash.  You have difficulty  breathing.  You develop any reaction or side effects to medicines given. Document Released: 04/24/2005 Document Revised: 07/26/2013 Document Reviewed: 03/12/2009 [facility] Patient Information 2015 [place], [place]. This information is not intended to replace advice given to you by your health care provider. Make sure you discuss any questions you have with your health care provider.

## 2015-03-07 NOTE — H&P (Signed)
Referring Physician(s): [name],Gustav C  History of Present Illness: Lauren Buchanan is a 81 y.o. female with bilateral breast cancer and PET revealed metabolic activity in H06 bone. The patient has been seen by Dr. Jana Hakim on 02/27/15 and scheduled today for CT guided biopsy of T11 bone lesion. The patient has had a H&P performed within the last 30 days, all history, medications, and exam have been reviewed. The patient denies any interval changes since the H&P. She denies any back pain, chest pain, shortness of breath or palpitations. She denies any active signs of bleeding or excessive bruising. She denies any recent fever or chills. The patient denies any history of sleep apnea or chronic oxygen use. She has previously tolerated sedation without complications.    Past Medical History  Diagnosis Date  . DJD (degenerative joint disease)   . IBS (irritable bowel syndrome)   . Goiter   . Complete heart block 1999    s/p PPM by Dr Olevia Perches, most recent generator change 2009  . Renal cyst   . COPD (chronic obstructive pulmonary disease)   . Headache(784.0)   . Pacemaker   . Wears glasses   . Hiatal hernia   . Esophageal stricture   . Diverticulosis   . Asthma   . Pneumonia   . Giant cell arteritis   . Cancer of central portion of female breast 12/04/2014  . Cancer of central portion of female breast 12/04/2014  . Breast cancer 12/08/2014    Bilateral  . Chest pain   . GERD (gastroesophageal reflux disease)   . History of general anesthesia complication   . Heart murmur   . Hemorrhoids     Past Surgical History  Procedure Laterality Date  . Pacemaker insertion  1999    Gen change ([facility]) by Dr Olevia Perches 2009  . Cholecystectomy    . Left lower lobectomy for brochiectasis  1950  . Hemorrhoidectomy    . Renal cyst excision    . Tonsillectomy    . Fracture surgery      fx lt wrist  . Colonoscopy    . Artery biopsy Left 01/03/2013    Procedure: BIOPSY LEFT TEMPORAL ARTERY;   Surgeon: Ascencion Dike, MD;  Location: [place];  Service: ENT;  Laterality: Left;  . Anal fissure repair    . Breast biopsy Right   . Rectal polypectomy    . Mouth surgery      Allergies: Morphine and related and Amoxicillin-pot clavulanate  Medications: Prior to Admission medications   Medication Sig Start Date End Date Taking? Authorizing Provider  albuterol (PROVENTIL HFA;VENTOLIN HFA) 108 (90 BASE) MCG/ACT inhaler Inhale 2 puffs into the lungs every 6 (six) hours as needed for shortness of breath. 10/17/13  Yes Tanda Rockers, MD  anastrozole (ARIMIDEX) 1 MG tablet Take 1 tablet (1 mg total) by mouth daily. 12/14/14  Yes Chauncey Cruel, MD  budesonide-formoterol [facility]) 160-4.5 MCG/ACT inhaler Inhale 2 puffs into the lungs 2 (two) times daily. 08/09/14  Yes Kathee Delton, MD  Calcium Citrate-Vitamin D (CITRACAL PETITES/VITAMIN D) 200-250 MG-UNIT TABS Take 1 tablet by mouth daily.     Yes Historical Provider, MD  metoprolol succinate (TOPROL-XL) 100 MG 24 hr tablet TAKE 1/2 (ONE-HALF) TABLET BY MOUTH EVERY MORNING Patient taking differently: TAKE 50 MG BY MOUTH EVERY MORNING. 09/21/14  Yes Dorena Cookey, MD  predniSONE (DELTASONE) 5 MG tablet Take 5 mg by mouth daily with breakfast.   Yes Historical  Provider, MD  Probiotic Product (PROBIOTIC DAILY PO) Take 1 tablet by mouth daily. 1 Chew daily.   Yes Historical Provider, MD  traMADol (ULTRAM) 50 MG tablet Take 50 mg by mouth every 6 (six) hours as needed for moderate pain or severe pain.    Yes Historical Provider, MD  TURMERIC PO Take 1 capsule by mouth daily.   Yes Historical Provider, MD  vitamin C (ASCORBIC ACID) 500 MG tablet Take 500 mg by mouth daily.     Yes Historical Provider, MD     Family History  Problem Relation Age of Onset  . Heart disease Mother   . Breast cancer Mother   . Stomach cancer Father   . Seizures Son   . Thyroid disease Sister   . Alcohol abuse Father     History   Social  History  . Marital Status: Married    Spouse Name: N/A  . Number of Children: 4  . Years of Education: N/A   Occupational History  . Retired    Social History Main Topics  . Smoking status: Current Every Day Smoker -- 0.25 packs/day  . Smokeless tobacco: Never Used  . Alcohol Use: Not on file     Comment: Occasional   . Drug Use: No  . Sexual Activity: Not Currently   Other Topics Concern  . None   Social History Narrative   Review of Systems: A 12 point ROS discussed and pertinent positives are indicated in the HPI above.  All other systems are negative.  Review of Systems  Vital Signs: BP 158/71 mmHg  Pulse 70  Temp(Src) 97.9 F (36.6 C) (Oral)  Resp 18  SpO2 98%  Physical Exam  Constitutional: She is oriented to person, place, and time. No distress.  HENT:  Head: Normocephalic and atraumatic.  Neck: No tracheal deviation present.  Cardiovascular: Normal rate and regular rhythm.  Exam reveals no gallop and no friction rub.   No murmur heard. Pulmonary/Chest: Effort normal and breath sounds normal. No respiratory distress. She has no wheezes. She has no rales.  Abdominal: Soft. Bowel sounds are normal. She exhibits no distension. There is no tenderness.  Musculoskeletal: She exhibits no tenderness.  Neurological: She is alert and oriented to person, place, and time.  Skin: She is not diaphoretic.    Mallampati Score:  MD Evaluation Airway: WNL Heart: WNL Abdomen: WNL Chest/ Lungs: WNL ASA  Classification: 3 Mallampati/Airway Score: Two  Imaging: Dg Bone Density  02/19/2015   CLINICAL DATA:  Postmenopausal. Past medical history of breast carcinoma.  EXAM: DUAL X-RAY ABSORPTIOMETRY (DXA) FOR BONE MINERAL DENSITY  FINDINGS: AP LUMBAR SPINE L1 through L4  Bone Mineral Density (BMD):  0.756 g/cm2  Young Adult T-Score:  -2.6  LEFT FEMUR NECK  Bone Mineral Density (BMD):  0.564 g/cm2  Young Adult T-Score: -2.6  ASSESSMENT: Patient's diagnostic category is  OSTEOPOROSIS by WHO Criteria.  FRACTURE RISK: Increased  FRAX:  Not assessed, some T-scores at or below -2.5.  COMPARISON: None.  Effective therapies are available in the form of bisphosphonates, selective estrogen receptor modulators, biologic agents, and hormone replacement therapy (for women). All patients should ensure an adequate intake of dietary calcium (1200 mg daily) and vitamin D (800 IU daily) unless contraindicated.  All treatment decisions require clinical judgment and consideration of individual patient factors, including patient preferences, co-morbidities, previous drug use, risk factors not captured in the FRAX model (e.g., frailty, falls, vitamin D deficiency, increased bone turnover, interval significant decline in  bone density) and possible under- or over-estimation of fracture risk by FRAX.  The National Osteoporosis Foundation recommends that FDA-approved medical therapies be considered in postmenopausal women and men age 65 or older with a:  1. Hip or vertebral (clinical or morphometric) fracture.  2. T-score of -2.5 or lower at the spine or hip.  3. Ten-year fracture probability by FRAX of 3% or greater for hip fracture or 20% or greater for major osteoporotic fracture.  People with diagnosed cases of osteoporosis or at high risk for fracture should have regular bone mineral density tests. For patients eligible for Medicare, routine testing is allowed once every 2 years. The testing frequency can be increased to one year for patients who have rapidly progressing disease, those who are receiving or discontinuing medical therapy to restore bone mass, or have additional risk factors.  [employer] [facility]) Criteria:  Normal: T-scores from +1.0 to -1.0  Low Bone Mass (Osteopenia): T-scores between -1.0 and -2.5  Osteoporosis: T-scores -2.5 and below  Comparison to Reference Population:  T-score is the key measure used in the diagnosis of osteoporosis and relative risk determination for  fracture. It provides a value for bone mass relative to the mean bone mass of a young adult reference population expressed in terms of standard deviation (SD).  Z-score is the age-matched score showing the patient's values compared to a population matched for age, sex, and race. This is also expressed in terms of standard deviation. The patient may have values that compare favorably to the age-matched values and still be at increased risk for fracture.   Electronically Signed   By: Lajean Manes M.D.   On: 02/19/2015 16:32    Labs:  CBC:  Recent Labs  08/23/14 1124 09/27/14 1600 12/12/14 1148 03/07/15 0925  WBC 9.3 9.9 8.2 8.9  HGB 14.6 14.1 13.8 13.4  HCT 44.9 43.4 42.4 41.7  PLT 327.0 286 238 272    COAGS: No results for input(s): INR, APTT in the last 8760 hours.  BMP:  Recent Labs  08/23/14 1124 09/27/14 1600 12/12/14 1149 03/07/15 0925  NA 137 138 139 138  K 4.4 4.6 4.2 4.2  CL 101 98  --  104  CO2 32 30 30* 28  GLUCOSE 92 199* 113 79  BUN 16 9 14.4 14  CALCIUM 9.4 9.7 9.2 9.0  CREATININE 0.7 0.62 0.8 0.56  GFRNONAA  --  79*  --  >60  GFRAA  --  >90  --  >60    LIVER FUNCTION TESTS:  Recent Labs  09/27/14 1600 12/12/14 1149  BILITOT 0.5 1.05  AST 30 24  ALT 27 21  ALKPHOS 66 84  PROT 6.9 6.5  ALBUMIN 3.7 3.5    Assessment and Plan: History of bilateral breast cancer PET 12/21/44 with metabolic activity in F68 bone Seen by Dr. Jana Hakim on 02/27/15 Scheduled today for image guided T11 bone lesion biopsy with moderate sedation The patient has been NPO, no blood thinners taken, labs and vitals have been reviewed. Risks and Benefits discussed with the patient including, but not limited to bleeding, infection, damage to adjacent structures or low yield requiring additional tests. All of the patient's questions were answered, patient is agreeable to proceed. Consent signed and in chart. Complete heart block s/p PPM COPD  Thank you for this interesting  consult.  I greatly enjoyed meeting KAYLEA MOUNSEY and look forward to participating in their care.  SignedHedy Jacob 03/07/2015, 10:28 AM

## 2015-03-07 NOTE — Procedures (Signed)
Interventional Radiology Procedure Note  Procedure: CT guided core biopsy and FNA of X50 lesion  Complications: None  Estimated Blood Loss: 0  Recommendations: - Bedrest x 2 hrs - Path pending  Signed,  Criselda Peaches, MD

## 2015-03-10 ENCOUNTER — Other Ambulatory Visit: Payer: Self-pay | Admitting: Oncology

## 2015-03-12 ENCOUNTER — Ambulatory Visit
Admission: RE | Admit: 2015-03-12 | Discharge: 2015-03-12 | Disposition: A | Discharge status: Home / Self Care | Payer: Medicare Other | Source: Ambulatory Visit | Attending: Oncology | Admitting: Oncology

## 2015-03-12 DIAGNOSIS — C50412 Malignant neoplasm of upper-outer quadrant of left female breast: Secondary | ICD-10-CM

## 2015-03-12 DIAGNOSIS — C50912 Malignant neoplasm of unspecified site of left female breast: Secondary | ICD-10-CM | POA: Diagnosis not present

## 2015-03-12 DIAGNOSIS — C50911 Malignant neoplasm of unspecified site of right female breast: Secondary | ICD-10-CM | POA: Diagnosis not present

## 2015-03-12 DIAGNOSIS — N63 Unspecified lump in breast: Secondary | ICD-10-CM | POA: Diagnosis not present

## 2015-03-13 ENCOUNTER — Telehealth: Payer: Self-pay | Admitting: *Deleted

## 2015-03-13 NOTE — Telephone Encounter (Signed)
Patient called and requested biopsy results from last week.  Please call at [phone].

## 2015-03-15 ENCOUNTER — Other Ambulatory Visit: Payer: Self-pay | Admitting: Oncology

## 2015-03-15 ENCOUNTER — Ambulatory Visit: Payer: Medicare Other | Admitting: Pulmonary Disease

## 2015-03-15 NOTE — Telephone Encounter (Signed)
MD called pt.

## 2015-03-19 ENCOUNTER — Other Ambulatory Visit: Payer: Self-pay | Admitting: Oncology

## 2015-03-20 ENCOUNTER — Telehealth: Payer: Self-pay | Admitting: Oncology

## 2015-03-20 ENCOUNTER — Other Ambulatory Visit: Payer: Medicare Other

## 2015-03-20 ENCOUNTER — Ambulatory Visit (HOSPITAL_BASED_OUTPATIENT_CLINIC_OR_DEPARTMENT_OTHER): Payer: Medicare Other | Admitting: Oncology

## 2015-03-20 ENCOUNTER — Ambulatory Visit (HOSPITAL_BASED_OUTPATIENT_CLINIC_OR_DEPARTMENT_OTHER): Payer: Medicare Other

## 2015-03-20 VITALS — BP 168/60 | HR 70 | Temp 98.6°F | Resp 20 | Ht 60.0 in | Wt 121.2 lb

## 2015-03-20 DIAGNOSIS — C50412 Malignant neoplasm of upper-outer quadrant of left female breast: Secondary | ICD-10-CM

## 2015-03-20 DIAGNOSIS — M810 Age-related osteoporosis without current pathological fracture: Secondary | ICD-10-CM

## 2015-03-20 DIAGNOSIS — Z79811 Long term (current) use of aromatase inhibitors: Secondary | ICD-10-CM

## 2015-03-20 DIAGNOSIS — C773 Secondary and unspecified malignant neoplasm of axilla and upper limb lymph nodes: Secondary | ICD-10-CM | POA: Diagnosis not present

## 2015-03-20 DIAGNOSIS — C50512 Malignant neoplasm of lower-outer quadrant of left female breast: Secondary | ICD-10-CM

## 2015-03-20 DIAGNOSIS — C50919 Malignant neoplasm of unspecified site of unspecified female breast: Secondary | ICD-10-CM

## 2015-03-20 DIAGNOSIS — C7951 Secondary malignant neoplasm of bone: Secondary | ICD-10-CM

## 2015-03-20 MED ORDER — SODIUM CHLORIDE 0.9 % IV SOLN
Freq: Once | INTRAVENOUS | Status: AC
  Administered 2015-03-20: 10:00:00 via INTRAVENOUS

## 2015-03-20 MED ORDER — ZOLEDRONIC ACID 4 MG/5ML IV CONC
3.3000 mg | Freq: Once | INTRAVENOUS | Status: AC
  Administered 2015-03-20: 3.3 mg via INTRAVENOUS
  Filled 2015-03-20: qty 4.13

## 2015-03-20 NOTE — Telephone Encounter (Signed)
Gave avs & calendar for August °

## 2015-03-20 NOTE — Patient Instructions (Signed)

## 2015-03-20 NOTE — Progress Notes (Signed)
[place]  [phone] [phone]     ID: Lauren Buchanan DOB: January 09, 1976  MR#: [id number]  FTD#:[id number]  Patient Care Team: Dorena Cookey, MD as PCP - General (Family Medicine) Fanny Skates, MD as Consulting Physician (General Surgery) Chauncey Cruel, MD as Consulting Physician (Oncology) [name], MD as Consulting Physician (Radiation Oncology) Rockwell Germany, RN as Registered Nurse Mauro Kaufmann, RN as Registered Nurse PCP: Joycelyn Man, MD GYN: OTHER MD: Danton Sewer MD, Thompson Grayer MD, Unice Bailey MD, Rolm Bookbinder MD  CHIEF COMPLAINT: bilateral estrogen receptor positive breast cancer, metastatic to bone  CURRENT TREATMENT: Anastrozole, zolendronate  NOTE: Patient requests her daughter [name] be contacted regarding all appointments and procedures. [name] can be reached at [place]: From the original intake note:  "Dot"  Has been aware of a sore right nipple for the last 6-8 months. She attributed it to her washing machine, which rubs that area when she uses it. On  11/21/2014 she saw Dr. 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 She mentioned the chest wall problem and Dr. Ubaldo Glassing obtained to skin biopsies 1 from the right inferior central chest and the other one of from the right mid medial chest.  The second of these showed a nodular basal cell carcinoma. The first one however showed invasive carcinoma consistent with a breast primary. This tumor was estrogen receptor positive at 100%, progesterone receptor positive at 36%, both with strong staining intensity, and HER-2 was amplified , the signals ratio being 6.2. [name] [id number])  On 12/06/2014 the patient underwent bilateral diagnostic mammography with bilateral ultrasonography. Breast density was category B. There was bilateral nipple retraction. In the left breast upper outer quadrant there was a spiculated mass noted  by mammography with a second spiculated mass in the lower inner quadrant. By ultrasound there was a 1.6 cm hypoechoic lesion at the 1:30 o'clock position in the left breast, and a second mass more medially measuring 0.9 cm. Also at the 8:00 position in the left breast 2 cm from the nipple there was a 1.3 cm mass. Ultrasound of the left axilla showed an enlarged lymph node with fatty hilum replacement, measuring 1.4 cm.  In the right breast mammography showed a cluster of indeterminate microcalcifications in the upper outer quadrant and a retroareolar mass, which bile does sound measured 2.9 cm. Ultrasound of the right axilla showed a right axilla lymph node measuring 1.4 cm.  On 12/06/2014 the patient underwent needle core biopsy of 2 separate masses in the left breast (at 1:00 and 8:00) as well as the enlarged axillary lymph node. All 3 were positive for an invasive lobular carcinoma (E-cadherin negative). All 3 tumors were estrogen receptor positive with strong staining intensity (between 87% and 100%). The lymph node was progesterone receptor negative, the other 2 masses being progesterone receptor positive at 9% and at 76%. This cancer was reported to be HER-2 positive at conference today, though I do not have the ratios at hand.  The patient is not an MRI candidate because she has a permanent pacemaker in place. Her subsequent history is as detailed below   INTERVAL HISTORY: Dot returns today for follow up of her breast cancer, accompanied by her daughter [name]. Since her last visit here she underwent biopsy of her T11 lesion. This ([id number] [phone]) confirmed metastatic carcinoma, which was estrogen receptor positive. This is of course most consistent with metastatic breast cancer  which is what we are dealing with. She did well with the biopsy, with no unusual complications. Since her last visit here also she had restaging bilateral breast ultrasonography, which shows the measurable disease to be either  improved or stable, with no evidence of new lesions and no previously noted lesions in the larger. This indicates that the anastrozole is working. She is tolerating that medication well with no hot flashes, some vaginal dryness symptoms, no significant arthralgias or myalgias. She obtains it at approximately $7 a month.   REVIEW OF SYSTEMS: Dot is [facility]. She feels forgetful, but not anxious or depressed. She is not exercising on a regular basis. She does have aches and pains here and there, and she usually takes some pain medication with breakfast and that helps her be more active during the day. This does not constipate her. A detailed review of systems today was otherwise stable   PAST MEDICAL HISTORY: Past Medical History  Diagnosis Date  . DJD (degenerative joint disease)   . IBS (irritable bowel syndrome)   . Goiter   . Complete heart block 1999    s/p PPM by Dr Juanda Chance, most recent generator change 2009  . Renal cyst   . COPD (chronic obstructive pulmonary disease)   . Headache(784.0)   . Pacemaker   . Wears glasses   . Hiatal hernia   . Esophageal stricture   . Diverticulosis   . Asthma   . Pneumonia   . Giant cell arteritis   . Cancer of central portion of female breast 12/04/2014  . Cancer of central portion of female breast 12/04/2014  . Breast cancer 12/08/2014    Bilateral  . Chest pain   . GERD (gastroesophageal reflux disease)   . History of general anesthesia complication   . Heart murmur   . Hemorrhoids     PAST SURGICAL HISTORY: Past Surgical History  Procedure Laterality Date  . Pacemaker insertion  1999    Gen change (SJM) by Dr Juanda Chance 2009  . Cholecystectomy    . Left lower lobectomy for brochiectasis  1950  . Hemorrhoidectomy    . Renal cyst excision    . Tonsillectomy    . Fracture surgery      fx lt wrist  . Colonoscopy    . Artery biopsy Left 01/03/2013    Procedure: BIOPSY LEFT TEMPORAL ARTERY;  Surgeon: Darletta Moll, MD;   Location: [facility];  Service: ENT;  Laterality: Left;  . Anal fissure repair    . Breast biopsy Right   . Rectal polypectomy    . Mouth surgery      FAMILY HISTORY Family History  Problem Relation Age of Onset  . Heart disease Mother   . Breast cancer Mother   . Stomach cancer Father   . Seizures Son   . Thyroid disease Sister   . Alcohol abuse Father    the patient's father died at the age of 41, with stomach cancer. The patient's mother died at the age of 97 from a myocardial infarction. She had been diagnosed with breast cancer in her 46s. The patient had no brothers, 2 sisters. There is no other history of breast or ovarian cancer in the family to the patient's knowledge  GYNECOLOGIC HISTORY:  No LMP recorded. Patient is postmenopausal. Menarche age 56, first live birth age 49. The patient is GX P4. She underwent menopause in her late 30s. She did not take hormone replacement.  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 She is a widow, lives by herself, with no pets. Daughter Wendy Poet lives in [place] where she is Mudlogger of a preschool and day care. Son Jamilyn Pigeon lives in [place] where he owns a furniture to sign store. Daughter Shelly Rubenstein lives in [place]. She owns a Chiropractor. Son Randall Hiss is physically and mentally disabled and lives in a group home in [place]. The patient has 7 grandchildren and 2 great-grandchildren. She attends a local [place]: In place; the patient's daughter [name] is her healthcare power of attorney. She can be reached at [place]: History  Substance Use Topics  . Smoking status: Current Every Day Smoker -- 0.25 packs/day  . Smokeless tobacco: Never Used  . Alcohol Use: Not on file     Comment: Occasional      Colonoscopy:  PAP:  Bone density:02/19/2015, osteoporosis  Lipid panel:  Allergies  Allergen Reactions  . Morphine  And Related Nausea Only  . Amoxicillin-Pot Clavulanate Other (See Comments)    Unknown     Current Outpatient Prescriptions  Medication Sig Dispense Refill  . albuterol (PROVENTIL HFA;VENTOLIN HFA) 108 (90 BASE) MCG/ACT inhaler Inhale 2 puffs into the lungs every 6 (six) hours as needed for shortness of breath. 1 Inhaler 2  . anastrozole (ARIMIDEX) 1 MG tablet Take 1 tablet (1 mg total) by mouth daily. 30 tablet 3  . budesonide-formoterol (SYMBICORT) 160-4.5 MCG/ACT inhaler Inhale 2 puffs into the lungs 2 (two) times daily. 3 Inhaler 3  . Calcium Citrate-Vitamin D (CITRACAL PETITES/VITAMIN D) 200-250 MG-UNIT TABS Take 1 tablet by mouth daily.      . metoprolol succinate (TOPROL-XL) 100 MG 24 hr tablet TAKE 1/2 (ONE-HALF) TABLET BY MOUTH EVERY MORNING (Patient taking differently: TAKE 50 MG BY MOUTH EVERY MORNING.) 30 tablet 4  . predniSONE (DELTASONE) 5 MG tablet Take 5 mg by mouth daily with breakfast.    . Probiotic Product (PROBIOTIC DAILY PO) Take 1 tablet by mouth daily. 1 Chew daily.    . traMADol (ULTRAM) 50 MG tablet Take 50 mg by mouth every 6 (six) hours as needed for moderate pain or severe pain.     . TURMERIC PO Take 1 capsule by mouth daily.    . vitamin C (ASCORBIC ACID) 500 MG tablet Take 500 mg by mouth daily.      . [DISCONTINUED] loratadine (CLARITIN) 10 MG tablet Take 10 mg by mouth daily.       No current facility-administered medications for this visit.    OBJECTIVE: Elderly white womanin no acute distress  Filed Vitals:   03/20/15 0841  BP: 168/60  Pulse: 70  Temp: 98.6 F (37 C)  Resp: 20     Body mass index is 23.67 kg/(m^2).    ECOG FS:1 - Symptomatic but completely ambulatory  Sclerae unicteric, EOMs intact Oropharynx clear, dentition in good repair No cervical or supraclavicular adenopathy Lungs no rales or rhonchi Heart regular rate and rhythm Abd soft, nontender, positive bowel sounds MSK no focal spinal tenderness, no upper extremity  lymphedema Neuro: nonfocal, appropriate affect Breasts: Deferred   LAB RESULTS:  CMP     Component Value Date/Time   NA 138 03/07/2015 0925   NA 139 12/12/2014 1149   K 4.2 03/07/2015 0925   K 4.2 12/12/2014 1149   CL 104 03/07/2015 0925   CO2 28 03/07/2015 0925  CO2 30* 12/12/2014 1149   GLUCOSE 79 03/07/2015 0925   GLUCOSE 113 12/12/2014 1149   BUN 14 03/07/2015 0925   BUN 14.4 12/12/2014 1149   CREATININE 0.56 03/07/2015 0925   CREATININE 0.8 12/12/2014 1149   CALCIUM 9.0 03/07/2015 0925   CALCIUM 9.2 12/12/2014 1149   PROT 6.5 12/12/2014 1149   PROT 6.9 09/27/2014 1600   ALBUMIN 3.5 12/12/2014 1149   ALBUMIN 3.7 09/27/2014 1600   AST 24 12/12/2014 1149   AST 30 09/27/2014 1600   ALT 21 12/12/2014 1149   ALT 27 09/27/2014 1600   ALKPHOS 84 12/12/2014 1149   ALKPHOS 66 09/27/2014 1600   BILITOT 1.05 12/12/2014 1149   BILITOT 0.5 09/27/2014 1600   GFRNONAA >60 03/07/2015 0925   GFRAA >60 03/07/2015 0925    INo results found for: SPEP, UPEP  Lab Results  Component Value Date   WBC 8.9 03/07/2015   NEUTROABS 5.2 03/07/2015   HGB 13.4 03/07/2015   HCT 41.7 03/07/2015   MCV 92.1 03/07/2015   PLT 272 03/07/2015      Chemistry      Component Value Date/Time   NA 138 03/07/2015 0925   NA 139 12/12/2014 1149   K 4.2 03/07/2015 0925   K 4.2 12/12/2014 1149   CL 104 03/07/2015 0925   CO2 28 03/07/2015 0925   CO2 30* 12/12/2014 1149   BUN 14 03/07/2015 0925   BUN 14.4 12/12/2014 1149   CREATININE 0.56 03/07/2015 0925   CREATININE 0.8 12/12/2014 1149      Component Value Date/Time   CALCIUM 9.0 03/07/2015 0925   CALCIUM 9.2 12/12/2014 1149   ALKPHOS 84 12/12/2014 1149   ALKPHOS 66 09/27/2014 1600   AST 24 12/12/2014 1149   AST 30 09/27/2014 1600   ALT 21 12/12/2014 1149   ALT 27 09/27/2014 1600   BILITOT 1.05 12/12/2014 1149   BILITOT 0.5 09/27/2014 1600       No results found for: LABCA2  No components found for: LABCA125  No results for  input(s): INR in the last 168 hours.  Urinalysis    Component Value Date/Time   COLORURINE YELLOW 09/27/2014 1800   APPEARANCEUR CLEAR 09/27/2014 1800   LABSPEC 1.005 09/27/2014 1800   PHURINE 7.0 09/27/2014 1800   GLUCOSEU NEGATIVE 09/27/2014 1800   HGBUR NEGATIVE 09/27/2014 1800   HGBUR trace-intact 01/17/2010 0932   BILIRUBINUR NEGATIVE 09/27/2014 1800   BILIRUBINUR n 09/01/2011 1157   KETONESUR NEGATIVE 09/27/2014 1800   PROTEINUR NEGATIVE 09/27/2014 1800   PROTEINUR n 09/01/2011 1157   UROBILINOGEN 0.2 09/27/2014 1800   UROBILINOGEN 0.2 09/01/2011 1157   NITRITE NEGATIVE 09/27/2014 1800   NITRITE n 09/01/2011 1157   LEUKOCYTESUR TRACE* 09/27/2014 1800    STUDIES: Ct Biopsy  03/07/2015   CLINICAL DATA:  81 year old female with new diagnosis of breast cancer. With solitary hypermetabolic lesion in the anterior aspect of the T11 vertebral body concerning for an osseous metastasis.  EXAM: CT BIOPSY  Date: 03/07/2015  PROCEDURE: 1. CT-guided core biopsy of T11 sclerotic lesion. 2. CT-guided FNA biopsy of T11 sclerotic lesion Interventional Radiologist:  Criselda Peaches, MD  ANESTHESIA/SEDATION: Moderate (conscious) sedation was used. 1.5 mg Versed, 25 mcg Fentanyl were administered intravenously. The patient's vital signs were monitored continuously by radiology nursing throughout the procedure.  Sedation Time: 46 minutes  MEDICATIONS: None additional  TECHNIQUE: Informed consent was obtained from the patient following explanation of the procedure, risks, benefits and alternatives. The patient understands, agrees  and consents for the procedure. All questions were addressed. A time out was performed.  A planning axial CT scan was performed. The peripherally sclerotic lesion in the anterior aspect of the T11 vertebral body was successfully localized. A suitable skin entry site to allow for a right transpedicular approach was selected and the skin marked. The region was then sterilely  prepped and draped in standard fashion using Betadine skin prep. Local anesthesia was attained by infiltration with 1% lidocaine. A small dermatotomy was made. A 13 gauge introducer needle was then advanced along a transpedicular route into the vertebral body using the on Control drill and intermittent CT fluoroscopic imaging. The drill was positioned approximately 1.8 cm from the anterior vertebral body. Several drill biopsy passes were then performed through the lesion. Biopsy fragments were minimal using the drill technique. Therefore, the introducer needle was advanced to the very edge of the lesion.  Pathology was notified and cytopathology arrived in the procedure room. Several 20 gauge FNA biopsies were then obtained using Francine needles. Quick stain confirmed adequate sampling. Therefore, the introducer needle was removed and the procedure completed. Post biopsy imaging demonstrates no evidence of complication.  COMPLICATIONS: None  IMPRESSION: Technically successful CT-guided core biopsy and FNA biopsy of sclerotic lesion in the anterior aspect of the T11 vertebral body.  Signed,  Criselda Peaches, MD  [facility]  [facility]   Electronically Signed   By: Jacqulynn Cadet M.D.   On: 03/07/2015 15:24   Dg Bone Density  02/19/2015   CLINICAL DATA:  Postmenopausal. Past medical history of breast carcinoma.  EXAM: DUAL X-RAY ABSORPTIOMETRY (DXA) FOR BONE MINERAL DENSITY  FINDINGS: AP LUMBAR SPINE L1 through L4  Bone Mineral Density (BMD):  0.756 g/cm2  Young Adult T-Score:  -2.6  LEFT FEMUR NECK  Bone Mineral Density (BMD):  0.564 g/cm2  Young Adult T-Score: -2.6  ASSESSMENT: Patient's diagnostic category is OSTEOPOROSIS by WHO Criteria.  FRACTURE RISK: Increased  FRAX:  Not assessed, some T-scores at or below -2.5.  COMPARISON: None.  Effective therapies are available in the form of bisphosphonates, selective estrogen receptor modulators, biologic agents,  and hormone replacement therapy (for women). All patients should ensure an adequate intake of dietary calcium (1200 mg daily) and vitamin D (800 IU daily) unless contraindicated.  All treatment decisions require clinical judgment and consideration of individual patient factors, including patient preferences, co-morbidities, previous drug use, risk factors not captured in the FRAX model (e.g., frailty, falls, vitamin D deficiency, increased bone turnover, interval significant decline in bone density) and possible under- or over-estimation of fracture risk by FRAX.  The National Osteoporosis Foundation recommends that FDA-approved medical therapies be considered in postmenopausal women and men age 29 or older with a:  1. Hip or vertebral (clinical or morphometric) fracture.  2. T-score of -2.5 or lower at the spine or hip.  3. Ten-year fracture probability by FRAX of 3% or greater for hip fracture or 20% or greater for major osteoporotic fracture.  People with diagnosed cases of osteoporosis or at high risk for fracture should have regular bone mineral density tests. For patients eligible for Medicare, routine testing is allowed once every 2 years. The testing frequency can be increased to one year for patients who have rapidly progressing disease, those who are receiving or discontinuing medical therapy to restore bone mass, or have additional risk factors.  World Health Organization [facility]) Criteria:  Normal: T-scores from +1.0 to -1.0  Low Bone Mass (Osteopenia):  T-scores between -1.0 and -2.5  Osteoporosis: T-scores -2.5 and below  Comparison to Reference Population:  T-score is the key measure used in the diagnosis of osteoporosis and relative risk determination for fracture. It provides a value for bone mass relative to the mean bone mass of a young adult reference population expressed in terms of standard deviation (SD).  Z-score is the age-matched score showing the patient's values compared to a population  matched for age, sex, and race. This is also expressed in terms of standard deviation. The patient may have values that compare favorably to the age-matched values and still be at increased risk for fracture.   Electronically Signed   By: Lajean Manes M.D.   On: 02/19/2015 16:32   US Breast Ltd Uni Left Inc Axilla  03/12/2015   CLINICAL DATA:  81 year old female with history of bilateral invasive breast cancers currently being treated with hormone therapy only. Assess response to treatment.  EXAM: ULTRASOUND OF THE BILATERAL BREAST  COMPARISON:  Previous exam(s).  FINDINGS: Physical examination of the subareolar right breast reveals a firm palpable mass with associated mild retraction of the nipple. Physical examination of the upper-outer left breast reveals an area of thickening at the approximate 1:30 position with associated pain with palpation.  Targeted ultrasounds of the left breast was performed demonstrating an irregular hypoechoic mass at 130 4 cm from the nipple compatible with known biopsy proven malignancy measuring 1.1 x 1.2 by 1.4 cm, overall not significantly changed in size in appearance when compared to the prior exam. An additional mass at 8 o'clock 2 cm from the nipple measures 0.4 x 0.4 x 0.7 cm, previously measured 1 x 1.3 x 0.7 cm. No definite lymphadenopathy seen in the left axilla.  Targeted ultrasound of the right breast was performed demonstrating an irregular hypoechoic subareolar mass measuring 1.6 x 1.1 x 1.7 cm, previously measured 2.1 x 1.7 x 2.9 cm. This measures smaller when compared to the prior exam. An enlarged lymph node in the low right axilla appears unchanged.  IMPRESSION: 1. No significant change in the dominant biopsy proven malignancy in the left breast at 1:30, although a smaller mass in the left breast at 8 o'clock does appear decreased in size. No definite axillary lymphadenopathy seen on the left.  2. Slight decrease in size of subareolar right breast mass with a  persistent enlarged lymph node in the low right axilla.  RECOMMENDATION: Treatment plan for known bilateral breast malignancy.  I have discussed the findings and recommendations with the patient. Results were also provided in writing at the conclusion of the visit. If applicable, a reminder letter will be sent to the patient regarding the next appointment.  BI-RADS CATEGORY  6: Known biopsy-proven malignancy.   Electronically Signed   By: Everlean Alstrom M.D.   On: 03/12/2015 11:54   US Breast Ltd Uni Right Inc Axilla  03/12/2015   CLINICAL DATA:  81 year old female with history of bilateral invasive breast cancers currently being treated with hormone therapy only. Assess response to treatment.  EXAM: ULTRASOUND OF THE BILATERAL BREAST  COMPARISON:  Previous exam(s).  FINDINGS: Physical examination of the subareolar right breast reveals a firm palpable mass with associated mild retraction of the nipple. Physical examination of the upper-outer left breast reveals an area of thickening at the approximate 1:30 position with associated pain with palpation.  Targeted ultrasounds of the left breast was performed demonstrating an irregular hypoechoic mass at 130 4 cm from the nipple compatible with known  biopsy proven malignancy measuring 1.1 x 1.2 by 1.4 cm, overall not significantly changed in size in appearance when compared to the prior exam. An additional mass at 8 o'clock 2 cm from the nipple measures 0.4 x 0.4 x 0.7 cm, previously measured 1 x 1.3 x 0.7 cm. No definite lymphadenopathy seen in the left axilla.  Targeted ultrasound of the right breast was performed demonstrating an irregular hypoechoic subareolar mass measuring 1.6 x 1.1 x 1.7 cm, previously measured 2.1 x 1.7 x 2.9 cm. This measures smaller when compared to the prior exam. An enlarged lymph node in the low right axilla appears unchanged.  IMPRESSION: 1. No significant change in the dominant biopsy proven malignancy in the left breast at 1:30,  although a smaller mass in the left breast at 8 o'clock does appear decreased in size. No definite axillary lymphadenopathy seen on the left.  2. Slight decrease in size of subareolar right breast mass with a persistent enlarged lymph node in the low right axilla.  RECOMMENDATION: Treatment plan for known bilateral breast malignancy.  I have discussed the findings and recommendations with the patient. Results were also provided in writing at the conclusion of the visit. If applicable, a reminder letter will be sent to the patient regarding the next appointment.  BI-RADS CATEGORY  6: Known biopsy-proven malignancy.   Electronically Signed   By: Everlean Alstrom M.D.   On: 03/12/2015 11:54     ASSESSMENT: 81 y.o. [place] woman with bilateral breast cancer, as follows:  (1) status post right chest skin biopsy 11/21/2014 for a clinical T4 N1, stage IIIB invasive ductal carcinoma, estrogen and progesterone receptor positive, HER-2 amplified  (2) status post 2 separate left breast biopsies and one left axillary lymph node biopsy 12/04/2014, all positive for a clinical mT1c N1, stage IIA invasive lobular carcinoma, estrogen receptor positive, progesterone receptor variable, with MIB-1 between 10 and 40%, and no HER-2 amplification  (3) neoadjuvant anastrozole started 12/14/2014  (4) definitive surgery to consist of bilateral mastectomies without formal axillary dissection: Postponed  (5) adjuvant radiationto the T11 area pending   (6) consider anti-HER-2 treatment depending onresponse to antiestrogen therapy alone   (7) osteoporosis with T score of -2.6 on bone scan obtained 02/19/2015.  (a) zolendronate started 03/20/2015, to be repeated every all weeks  (7) PET scan 12/19/2014 shows in addition to the bilateral breast and bilateral axillary masses, a lesion at the T11 vertebral body, showing an irregular lucency on prior CT exam  PLAN: We reviewed Dot's situation extensively today. She  understands she has stage IV disease, and that this is not curable with our current knowledge base. The goal of treatment is control. The strategy of treatment is to do only the minimum necessary to control the growth of the tumor so that the patient can have as normal a life as possible. There is no survival advantage in treating aggressively if treating less aggressively results in tumor control. With this strategy stage IV breast cancer in many cases can function as a "chronic illness": something that cannot be quite gotten rid of but can be controlled for an indefinite period of time  There are always 3 questions to go over and so we reviewed those today. The first question is do we treat or not. In some cases the patient's overall situation is so discouraging that palliative/comfort care alone is appropriate. If the decision is made to treat, then the next question is whether anti-estrogens or chemotherapy is more appropriate. Once that  decision is made than the third question is: Which agent or combination of agents in particular should be used?  In her case she is tolerating treatment well and it well certainly prolong her life. There is no visceral crisis so we do not need to go to chemotherapy or even anti-HER-2 agents. We are going to continue the anastrozole so long as it is working. When it stops working we will consider switching to a different antiestrogen plus or minus Palbociclib and we can also add an anti-HER-2 agent, whether oral or more likely intravenous.  The one area of concern is T11. I have discussed this with Dr. Lisbeth Renshaw and we agree palliative radiation to this area is indicated. I placed a referral today. I expect that to be completed sometime before the end of this month.  We are also starting zolendronate. She did see her dentist and there are no plans for extractions or implants. She has a good understanding of the possible toxicities, side effects and complications of this  agent.  She will see me again in 12 weeks. She will have her next zolendronate treatment at that time. When I see her again in November we will do a bone scan prior to that visit.  Dot has a good understanding of this plan. She agrees with it. She knows the goal of treatment in her case is control. She will call with any problems that may develop before her next visit here     Chauncey Cruel, MD   03/20/2015 8:53 AM

## 2015-03-22 ENCOUNTER — Other Ambulatory Visit: Payer: Self-pay | Admitting: Oncology

## 2015-03-22 ENCOUNTER — Ambulatory Visit
Admission: RE | Admit: 2015-03-22 | Discharge: 2015-03-22 | Disposition: A | Discharge status: Home / Self Care | Payer: Medicare Other | Source: Ambulatory Visit

## 2015-03-22 ENCOUNTER — Encounter: Payer: Self-pay | Admitting: Radiation Oncology

## 2015-03-22 ENCOUNTER — Ambulatory Visit
Admission: RE | Admit: 2015-03-22 | Discharge: 2015-03-22 | Disposition: A | Discharge status: Home / Self Care | Payer: Medicare Other | Source: Ambulatory Visit | Attending: Radiation Oncology | Admitting: Radiation Oncology

## 2015-03-22 VITALS — BP 145/57 | HR 69 | Temp 97.6°F | Resp 20 | Ht 60.0 in | Wt 122.9 lb

## 2015-03-22 DIAGNOSIS — C7952 Secondary malignant neoplasm of bone marrow: Principal | ICD-10-CM

## 2015-03-22 DIAGNOSIS — C7951 Secondary malignant neoplasm of bone: Secondary | ICD-10-CM

## 2015-03-22 NOTE — Telephone Encounter (Signed)
Last OV 03/20/15.  Next OV 06/12/2015.  Chart reviewed.

## 2015-03-22 NOTE — Progress Notes (Signed)
Expand All Collapse All    Location of Breast Cancer:Upper Outer Quadrant Left Breast Stage IIA 1 o'clock position And metastatic mammary carcinoma in left axillary lymph node Histology per Pathology Report:  Diagnosis 12/06/14: 1. Breast, left, needle core biopsy, mass, 1 o'clock- INVASIVE MAMMARY CARCINOMA.- SEE COMMENT. 2. Breast, left, needle core biopsy, mass, 8 o'clock- INVASIVE DUCTAL CARCINOMA.- SEE COMMENT. 3. Lymph node, needle/core biopsy, left- ONE LYMPH NODE POSITIVE FOR METASTATIC MAMMARY CARCINOMA.-   Receptor Status: ER( + 87% ), PR (+36% ), Her2-neu ( neg Ki 67 9% )  Did patient present with symptoms (if so, please note symptoms) or was this found on screening mammography?: initially sen by Dermatologist Right upper extremity For squamous cell ca, bx;s On region Of right breast, and mid chest done on 11/21/14, Then went and had mammogram and ultrasound,   Past/Anticipated interventions by surgeon, if any: Dr. Renelda Loma Ingram,MD Note 12/12/14, patient not interested in surgery at this time f/u 2-3 weeks postponed  Diagnosis 03/07/15: Bone, biopsy, T11=- METASTATIC CARCINOMA.  Past/Anticipated interventions by medical oncology, if any: Chemotherapy : Dr. Jana Hakim  neoadjuvant anastrozole started 12/14/14,seen by Dr. Jana Hakim 6/1.16 started Zolendronate 03/20/15  Now his note states  stage IV , recommended palliative radiation to T 11 f/u in 12 weeks  Lymphedema issues, if any:   Pain issues, if any: generalized pain takes percocet prn  SAFETY ISSUES: YES  Prior radiation? NO  Pacemaker/ICD? YES  Possible current pregnancy? NO  Is the patient on methotrexate? NO  Current Complaints / other details: Widow, Seen in breast Clinic 12/12/14,menarche age 72, 52st live birth age 63,G7,P4,post menopausal late 10's, No HRT ,hx complete heart block ,pacemaker 1998-01-19,  COPD, father died stomach cancer age 67,mother dies of MI age 39,but had been dx breast cancer in her 8's;   Allergies: Morphine&related=nausea,,Amoxicilin-pot Clavulanate

## 2015-03-22 NOTE — Progress Notes (Signed)
Radiation Oncology         [phone] ________________________________  Name: SKYA MCCULLUM MRN: [id number]  Date: 03/22/2015  DOB: [birth year]  Follow-Up Visit Note  CC: Joycelyn Man, MD  [name], [name], MD  Diagnosis:    Breast cancer of upper-outer quadrant of left female breast   Staging form: Breast, AJCC 7th Edition     Clinical stage from 12/12/2014: Stage IIA (T1c, N1, M0) - Unsigned   Interval Since Last Radiation:  Not applicable   Narrative:  The patient returns today to discuss possible palliative radiation treatment. The patient after she was seen in breast clinic elected not to proceed with surgery. She has decided to proceed with hormonal treatment alone at this time. The patient underwent a PET scan which did show a metastasis in the T11 vertebral body. No other bony sites of disease. I have been asked to see the patient today for discussion of palate of radiation treatment to this area. She denies any pain or other symptoms associated with this.                   ALLERGIES:  is allergic to morphine and related and amoxicillin-pot clavulanate.  Meds: Current Outpatient Prescriptions  Medication Sig Dispense Refill  . albuterol (PROVENTIL HFA;VENTOLIN HFA) 108 (90 BASE) MCG/ACT inhaler Inhale 2 puffs into the lungs every 6 (six) hours as needed for shortness of breath. 1 Inhaler 2  . anastrozole (ARIMIDEX) 1 MG tablet TAKE 1 TABLET (1 MG TOTAL) BY MOUTH DAILY. 30 tablet 2  . budesonide-formoterol (SYMBICORT) 160-4.5 MCG/ACT inhaler Inhale 2 puffs into the lungs 2 (two) times daily. 3 Inhaler 3  . Calcium Citrate-Vitamin D (CITRACAL PETITES/VITAMIN D) 200-250 MG-UNIT TABS Take 1 tablet by mouth daily.      . metoprolol succinate (TOPROL-XL) 100 MG 24 hr tablet TAKE 1/2 (ONE-HALF) TABLET BY MOUTH EVERY MORNING (Patient taking differently: TAKE 50 MG BY MOUTH EVERY MORNING.) 30 tablet 4  . naproxen sodium (ANAPROX) 220 MG tablet Take 220 mg by mouth as needed.     Marland Kitchen oxyCODONE-acetaminophen (PERCOCET/ROXICET) 5-325 MG per tablet Take 1 tablet by mouth daily as needed for severe pain. 30 tablet 0  . predniSONE (DELTASONE) 5 MG tablet Take 5 mg by mouth daily with breakfast.    . Probiotic Product (PROBIOTIC DAILY PO) Take 1 tablet by mouth daily. 1 Chew daily.    . traMADol (ULTRAM) 50 MG tablet Take 50 mg by mouth every 6 (six) hours as needed for moderate pain or severe pain.     . TURMERIC PO Take 1 capsule by mouth daily.    . vitamin C (ASCORBIC ACID) 500 MG tablet Take 500 mg by mouth daily.      . [DISCONTINUED] loratadine (CLARITIN) 10 MG tablet Take 10 mg by mouth daily.       No current facility-administered medications for this encounter.    Physical Findings: The patient is in no acute distress. Patient is alert and oriented.  height is 5' (1.524 m) and weight is 122 lb 14.4 oz (55.747 kg). Her oral temperature is 97.6 F (36.4 C). Her blood pressure is 145/57 and her pulse is 69. Her respiration is 20 and oxygen saturation is 100%. .     Lab Findings: Lab Results  Component Value Date   WBC 8.9 03/07/2015   HGB 13.4 03/07/2015   HCT 41.7 03/07/2015   MCV 92.1 03/07/2015   PLT 272 03/07/2015     Radiographic  Findings: Ct Biopsy  03/07/2015   CLINICAL DATA:  81 year old female with new diagnosis of breast cancer. With solitary hypermetabolic lesion in the anterior aspect of the T11 vertebral body concerning for an osseous metastasis.  EXAM: CT BIOPSY  Date: 03/07/2015  PROCEDURE: 1. CT-guided core biopsy of T11 sclerotic lesion. 2. CT-guided FNA biopsy of T11 sclerotic lesion Interventional Radiologist:  Criselda Peaches, MD  ANESTHESIA/SEDATION: Moderate (conscious) sedation was used. 1.5 mg Versed, 25 mcg Fentanyl were administered intravenously. The patient's vital signs were monitored continuously by radiology nursing throughout the procedure.  Sedation Time: 46 minutes  MEDICATIONS: None additional  TECHNIQUE: Informed consent  was obtained from the patient following explanation of the procedure, risks, benefits and alternatives. The patient understands, agrees and consents for the procedure. All questions were addressed. A time out was performed.  A planning axial CT scan was performed. The peripherally sclerotic lesion in the anterior aspect of the T11 vertebral body was successfully localized. A suitable skin entry site to allow for a right transpedicular approach was selected and the skin marked. The region was then sterilely prepped and draped in standard fashion using Betadine skin prep. Local anesthesia was attained by infiltration with 1% lidocaine. A small dermatotomy was made. A 13 gauge introducer needle was then advanced along a transpedicular route into the vertebral body using the on Control drill and intermittent CT fluoroscopic imaging. The drill was positioned approximately 1.8 cm from the anterior vertebral body. Several drill biopsy passes were then performed through the lesion. Biopsy fragments were minimal using the drill technique. Therefore, the introducer needle was advanced to the very edge of the lesion.  Pathology was notified and cytopathology arrived in the procedure room. Several 20 gauge FNA biopsies were then obtained using Francine needles. Quick stain confirmed adequate sampling. Therefore, the introducer needle was removed and the procedure completed. Post biopsy imaging demonstrates no evidence of complication.  COMPLICATIONS: None  IMPRESSION: Technically successful CT-guided core biopsy and FNA biopsy of sclerotic lesion in the anterior aspect of the T11 vertebral body.  Signed,  Criselda Peaches, MD  [facility]  [facility]   Electronically Signed   By: Jacqulynn Cadet M.D.   On: 03/07/2015 15:24   US Breast Ltd Uni Left Inc Axilla  03/12/2015   CLINICAL DATA:  81 year old female with history of bilateral invasive breast cancers currently being  treated with hormone therapy only. Assess response to treatment.  EXAM: ULTRASOUND OF THE BILATERAL BREAST  COMPARISON:  Previous exam(s).  FINDINGS: Physical examination of the subareolar right breast reveals a firm palpable mass with associated mild retraction of the nipple. Physical examination of the upper-outer left breast reveals an area of thickening at the approximate 1:30 position with associated pain with palpation.  Targeted ultrasounds of the left breast was performed demonstrating an irregular hypoechoic mass at 130 4 cm from the nipple compatible with known biopsy proven malignancy measuring 1.1 x 1.2 by 1.4 cm, overall not significantly changed in size in appearance when compared to the prior exam. An additional mass at 8 o'clock 2 cm from the nipple measures 0.4 x 0.4 x 0.7 cm, previously measured 1 x 1.3 x 0.7 cm. No definite lymphadenopathy seen in the left axilla.  Targeted ultrasound of the right breast was performed demonstrating an irregular hypoechoic subareolar mass measuring 1.6 x 1.1 x 1.7 cm, previously measured 2.1 x 1.7 x 2.9 cm. This measures smaller when compared to the prior exam. An enlarged  lymph node in the low right axilla appears unchanged.  IMPRESSION: 1. No significant change in the dominant biopsy proven malignancy in the left breast at 1:30, although a smaller mass in the left breast at 8 o'clock does appear decreased in size. No definite axillary lymphadenopathy seen on the left.  2. Slight decrease in size of subareolar right breast mass with a persistent enlarged lymph node in the low right axilla.  RECOMMENDATION: Treatment plan for known bilateral breast malignancy.  I have discussed the findings and recommendations with the patient. Results were also provided in writing at the conclusion of the visit. If applicable, a reminder letter will be sent to the patient regarding the next appointment.  BI-RADS CATEGORY  6: Known biopsy-proven malignancy.   Electronically Signed    By: Everlean Alstrom M.D.   On: 03/12/2015 11:54   US Breast Ltd Uni Right Inc Axilla  03/12/2015   CLINICAL DATA:  81 year old female with history of bilateral invasive breast cancers currently being treated with hormone therapy only. Assess response to treatment.  EXAM: ULTRASOUND OF THE BILATERAL BREAST  COMPARISON:  Previous exam(s).  FINDINGS: Physical examination of the subareolar right breast reveals a firm palpable mass with associated mild retraction of the nipple. Physical examination of the upper-outer left breast reveals an area of thickening at the approximate 1:30 position with associated pain with palpation.  Targeted ultrasounds of the left breast was performed demonstrating an irregular hypoechoic mass at 130 4 cm from the nipple compatible with known biopsy proven malignancy measuring 1.1 x 1.2 by 1.4 cm, overall not significantly changed in size in appearance when compared to the prior exam. An additional mass at 8 o'clock 2 cm from the nipple measures 0.4 x 0.4 x 0.7 cm, previously measured 1 x 1.3 x 0.7 cm. No definite lymphadenopathy seen in the left axilla.  Targeted ultrasound of the right breast was performed demonstrating an irregular hypoechoic subareolar mass measuring 1.6 x 1.1 x 1.7 cm, previously measured 2.1 x 1.7 x 2.9 cm. This measures smaller when compared to the prior exam. An enlarged lymph node in the low right axilla appears unchanged.  IMPRESSION: 1. No significant change in the dominant biopsy proven malignancy in the left breast at 1:30, although a smaller mass in the left breast at 8 o'clock does appear decreased in size. No definite axillary lymphadenopathy seen on the left.  2. Slight decrease in size of subareolar right breast mass with a persistent enlarged lymph node in the low right axilla.  RECOMMENDATION: Treatment plan for known bilateral breast malignancy.  I have discussed the findings and recommendations with the patient. Results were also provided in  writing at the conclusion of the visit. If applicable, a reminder letter will be sent to the patient regarding the next appointment.  BI-RADS CATEGORY  6: Known biopsy-proven malignancy.   Electronically Signed   By: Everlean Alstrom M.D.   On: 03/12/2015 11:54    Impression:    The patient in my opinion is a good candidate for a short course of palliative radiation treatment to the T 11 vertebral body. We discussed the role of this treatment potentially to help prevent further problems involving the spine. We also discussed the major alternative which would be to proceed with hormonal treatment alone currently and 2 follow this area with imaging studies. The pros and cons of each approach were discussed.  The patient indicated today that she stated that she wished to proceed with radiation treatment.  Plan:  The patient will be scheduled for simulation in the near future such that we can proceed with treatment planning. I am out of town next week so we will schedule her as soon as possible following week.  The patient was seen today for 30 minutes, with the majority of the time spent counseling the patient on his diagnosis of cancer and coordinating his care.    Jodelle Gross, M.D., Ph.D.

## 2015-03-22 NOTE — Progress Notes (Signed)
Please see the Nurse Progress Note in the MD Initial Consult Encounter for this patient. 

## 2015-03-27 ENCOUNTER — Ambulatory Visit: Payer: Medicare Other | Admitting: Radiation Oncology

## 2015-04-03 ENCOUNTER — Ambulatory Visit
Admission: RE | Admit: 2015-04-03 | Discharge: 2015-04-03 | Disposition: A | Discharge status: Home / Self Care | Payer: Medicare Other | Source: Ambulatory Visit | Attending: Radiation Oncology | Admitting: Radiation Oncology

## 2015-04-03 DIAGNOSIS — C7951 Secondary malignant neoplasm of bone: Secondary | ICD-10-CM | POA: Diagnosis not present

## 2015-04-03 DIAGNOSIS — C7952 Secondary malignant neoplasm of bone marrow: Principal | ICD-10-CM

## 2015-04-04 ENCOUNTER — Telehealth: Payer: Self-pay | Admitting: *Deleted

## 2015-04-04 ENCOUNTER — Telehealth: Payer: Self-pay | Admitting: Internal Medicine

## 2015-04-04 NOTE — Telephone Encounter (Signed)
New Message  Pt wanted to speak w/ Rn about upcoming procedure- whether she can stand it, Please call back and dsicuss.

## 2015-04-04 NOTE — Telephone Encounter (Signed)
Spoke with patient and let her know Dr Rayann Heman has filled out forms for her to proceed

## 2015-04-04 NOTE — Telephone Encounter (Signed)
refaxed pacemaker form to Dr. Jackalyn Lombard Office fax [phone], spoke with Pamala Hurry ,she just got the fax and will give to the appropriate person and will get this pacemaker form signed and will fax back  asap as requested,thanked Barbara 11:31 AM

## 2015-04-08 DIAGNOSIS — C7951 Secondary malignant neoplasm of bone: Secondary | ICD-10-CM | POA: Diagnosis not present

## 2015-04-10 ENCOUNTER — Ambulatory Visit
Admission: RE | Admit: 2015-04-10 | Discharge: 2015-04-10 | Disposition: A | Discharge status: Home / Self Care | Payer: Medicare Other | Source: Ambulatory Visit | Attending: Radiation Oncology | Admitting: Radiation Oncology

## 2015-04-10 ENCOUNTER — Ambulatory Visit: Payer: Medicare Other

## 2015-04-10 DIAGNOSIS — C7951 Secondary malignant neoplasm of bone: Secondary | ICD-10-CM | POA: Diagnosis not present

## 2015-04-11 ENCOUNTER — Ambulatory Visit
Admission: RE | Admit: 2015-04-11 | Discharge: 2015-04-11 | Disposition: A | Discharge status: Home / Self Care | Payer: Medicare Other | Source: Ambulatory Visit | Attending: Radiation Oncology | Admitting: Radiation Oncology

## 2015-04-11 ENCOUNTER — Ambulatory Visit: Payer: Medicare Other

## 2015-04-11 DIAGNOSIS — C7951 Secondary malignant neoplasm of bone: Secondary | ICD-10-CM

## 2015-04-11 DIAGNOSIS — C7952 Secondary malignant neoplasm of bone marrow: Principal | ICD-10-CM

## 2015-04-11 MED ORDER — RADIAPLEXRX EX GEL
Freq: Once | CUTANEOUS | Status: AC
  Administered 2015-04-11: 15:00:00 via TOPICAL

## 2015-04-11 NOTE — Progress Notes (Signed)
Pt education done, radiation therapy and you book,my business card,rdiaplex gel given to patient, discussed ways to manage symptoms/side effects, : nausea, vomiting,diarrhea,fatigue,skin irritation, increase protein in diet, stay hydrated, may need to eat 5-6 smaller meals throughout the day with snacks in between,low fiber diet if having diarrhea, buy imodium ad OTC and use prn for diarrhea, verbal understand,teach back by patient 2:44 PM

## 2015-04-12 ENCOUNTER — Ambulatory Visit
Admission: RE | Admit: 2015-04-12 | Discharge: 2015-04-12 | Disposition: A | Discharge status: Home / Self Care | Payer: Medicare Other | Source: Ambulatory Visit | Attending: Radiation Oncology | Admitting: Radiation Oncology

## 2015-04-12 ENCOUNTER — Encounter: Payer: Self-pay | Admitting: Radiation Oncology

## 2015-04-12 VITALS — BP 149/68 | HR 70 | Temp 98.2°F | Resp 20 | Wt 123.3 lb

## 2015-04-12 DIAGNOSIS — C7951 Secondary malignant neoplasm of bone: Secondary | ICD-10-CM

## 2015-04-12 DIAGNOSIS — C7952 Secondary malignant neoplasm of bone marrow: Principal | ICD-10-CM

## 2015-04-12 NOTE — Progress Notes (Signed)
Weekly rad txs T-10-12 3/10 completd, no c/o pain or nausea, does have some indigestuion stated,pt education done ytesterday 10:47 AM BP 149/68 mmHg  Pulse 70  Temp(Src) 98.2 F (36.8 C) (Oral)  Resp 20  Wt 123 lb 4.8 oz (55.929 kg)  SpO2 97%  Wt Readings from Last 3 Encounters:  04/12/15 123 lb 4.8 oz (55.929 kg)  03/22/15 122 lb 14.4 oz (55.747 kg)  03/20/15 121 lb 3.2 oz (54.976 kg)

## 2015-04-12 NOTE — Progress Notes (Signed)
   Department of Radiation Oncology  Phone:  [phone] Fax:        [phone]  Weekly Treatment Note    Name: Lauren Buchanan Date: 04/12/2015 MRN: [id number] DOB: 09/13/1925   Current dose: 9 Gy  Current fraction: 3   MEDICATIONS: Current Outpatient Prescriptions  Medication Sig Dispense Refill  . albuterol (PROVENTIL HFA;VENTOLIN HFA) 108 (90 BASE) MCG/ACT inhaler Inhale 2 puffs into the lungs every 6 (six) hours as needed for shortness of breath. 1 Inhaler 2  . anastrozole (ARIMIDEX) 1 MG tablet TAKE 1 TABLET (1 MG TOTAL) BY MOUTH DAILY. 30 tablet 2  . budesonide-formoterol (SYMBICORT) 160-4.5 MCG/ACT inhaler Inhale 2 puffs into the lungs 2 (two) times daily. 3 Inhaler 3  . Calcium Citrate-Vitamin D (CITRACAL PETITES/VITAMIN D) 200-250 MG-UNIT TABS Take 1 tablet by mouth daily.      Derrill Memo ON 04/15/2015] hyaluronate sodium (RADIAPLEXRX) GEL Apply 1 application topically daily.    . metoprolol succinate (TOPROL-XL) 100 MG 24 hr tablet TAKE 1/2 (ONE-HALF) TABLET BY MOUTH EVERY MORNING (Patient taking differently: TAKE 50 MG BY MOUTH EVERY MORNING.) 30 tablet 4  . naproxen sodium (ANAPROX) 220 MG tablet Take 220 mg by mouth as needed.    Marland Kitchen oxyCODONE-acetaminophen (PERCOCET/ROXICET) 5-325 MG per tablet Take 1 tablet by mouth daily as needed for severe pain. 30 tablet 0  . predniSONE (DELTASONE) 5 MG tablet Take 5 mg by mouth daily with breakfast.    . Probiotic Product (PROBIOTIC DAILY PO) Take 1 tablet by mouth daily. 1 Chew daily.    . traMADol (ULTRAM) 50 MG tablet Take 50 mg by mouth every 6 (six) hours as needed for moderate pain or severe pain.     . TURMERIC PO Take 1 capsule by mouth daily.    . vitamin C (ASCORBIC ACID) 500 MG tablet Take 500 mg by mouth daily.      . [DISCONTINUED] loratadine (CLARITIN) 10 MG tablet Take 10 mg by mouth daily.       No current facility-administered medications for this encounter.     ALLERGIES: Morphine and related and  Amoxicillin-pot clavulanate   LABORATORY DATA:  Lab Results  Component Value Date   WBC 8.9 03/07/2015   HGB 13.4 03/07/2015   HCT 41.7 03/07/2015   MCV 92.1 03/07/2015   PLT 272 03/07/2015   Lab Results  Component Value Date   NA 138 03/07/2015   K 4.2 03/07/2015   CL 104 03/07/2015   CO2 28 03/07/2015   Lab Results  Component Value Date   ALT 21 12/12/2014   AST 24 12/12/2014   ALKPHOS 84 12/12/2014   BILITOT 1.05 12/12/2014     NARRATIVE: Lauren Buchanan was seen today for weekly treatment management. The chart was checked and the patient's films were reviewed.  Weekly rad txs T-10-12 3/10 completd, no c/o pain or nausea, does have some indigestuion stated,pt education done ytesterday 11:23 AM There were no vitals taken for this visit.  Wt Readings from Last 3 Encounters:  04/12/15 123 lb 4.8 oz (55.929 kg)  03/22/15 122 lb 14.4 oz (55.747 kg)  03/20/15 121 lb 3.2 oz (54.976 kg)    PHYSICAL EXAMINATION: vitals were not taken for this visit.     alert, in no acute distress  ASSESSMENT: The patient is doing satisfactorily with treatment.  PLAN: We will continue with the patient's radiation treatment as planned.

## 2015-04-15 ENCOUNTER — Ambulatory Visit
Admission: RE | Admit: 2015-04-15 | Discharge: 2015-04-15 | Disposition: A | Discharge status: Home / Self Care | Payer: Medicare Other | Source: Ambulatory Visit | Attending: Radiation Oncology | Admitting: Radiation Oncology

## 2015-04-15 DIAGNOSIS — C7951 Secondary malignant neoplasm of bone: Secondary | ICD-10-CM | POA: Diagnosis not present

## 2015-04-16 ENCOUNTER — Ambulatory Visit
Admission: RE | Admit: 2015-04-16 | Discharge: 2015-04-16 | Disposition: A | Discharge status: Home / Self Care | Payer: Medicare Other | Source: Ambulatory Visit | Attending: Radiation Oncology | Admitting: Radiation Oncology

## 2015-04-16 DIAGNOSIS — C7951 Secondary malignant neoplasm of bone: Secondary | ICD-10-CM | POA: Diagnosis not present

## 2015-04-17 ENCOUNTER — Encounter: Payer: Self-pay | Admitting: Radiation Oncology

## 2015-04-17 ENCOUNTER — Ambulatory Visit
Admission: RE | Admit: 2015-04-17 | Discharge: 2015-04-17 | Disposition: A | Discharge status: Home / Self Care | Payer: Medicare Other | Source: Ambulatory Visit | Attending: Radiation Oncology | Admitting: Radiation Oncology

## 2015-04-17 VITALS — BP 158/67 | HR 70 | Temp 98.1°F | Resp 20 | Wt 124.0 lb

## 2015-04-17 DIAGNOSIS — M0609 Rheumatoid arthritis without rheumatoid factor, multiple sites: Secondary | ICD-10-CM | POA: Diagnosis not present

## 2015-04-17 DIAGNOSIS — C7952 Secondary malignant neoplasm of bone marrow: Principal | ICD-10-CM

## 2015-04-17 DIAGNOSIS — C7951 Secondary malignant neoplasm of bone: Secondary | ICD-10-CM | POA: Diagnosis not present

## 2015-04-17 DIAGNOSIS — M2550 Pain in unspecified joint: Secondary | ICD-10-CM | POA: Diagnosis not present

## 2015-04-17 DIAGNOSIS — M25552 Pain in left hip: Secondary | ICD-10-CM | POA: Diagnosis not present

## 2015-04-17 DIAGNOSIS — M316 Other giant cell arteritis: Secondary | ICD-10-CM | POA: Diagnosis not present

## 2015-04-17 NOTE — Progress Notes (Addendum)
Weekly rad txs T10-T12 6/10 completed, no c/o pain,, no sob, no nausea,  Appetite good, no fatigue 10:23 AM BP 158/67 mmHg  Pulse 70  Temp(Src) 98.1 F (36.7 C) (Oral)  Resp 20  Wt 124 lb (56.246 kg)  SpO2 100%  Wt Readings from Last 3 Encounters:  04/17/15 124 lb (56.246 kg)  04/12/15 123 lb 4.8 oz (55.929 kg)  03/22/15 122 lb 14.4 oz (55.747 kg)   Gaspar Garbe, RN II Rad/Onc

## 2015-04-17 NOTE — Progress Notes (Signed)
Department of Radiation Oncology  Phone:  [phone] Fax:        [phone]  Weekly Treatment Note    Name: Lauren Buchanan Date: 04/17/2015 MRN: [id number] DOB: March 21, 1925   Current dose: 18 Gy  Current fraction: 6   MEDICATIONS: Current Outpatient Prescriptions  Medication Sig Dispense Refill  . albuterol (PROVENTIL HFA;VENTOLIN HFA) 108 (90 BASE) MCG/ACT inhaler Inhale 2 puffs into the lungs every 6 (six) hours as needed for shortness of breath. 1 Inhaler 2  . anastrozole (ARIMIDEX) 1 MG tablet TAKE 1 TABLET (1 MG TOTAL) BY MOUTH DAILY. 30 tablet 2  . budesonide-formoterol (SYMBICORT) 160-4.5 MCG/ACT inhaler Inhale 2 puffs into the lungs 2 (two) times daily. 3 Inhaler 3  . Calcium Citrate-Vitamin D (CITRACAL PETITES/VITAMIN D) 200-250 MG-UNIT TABS Take 1 tablet by mouth daily.      . hyaluronate sodium (RADIAPLEXRX) GEL Apply 1 application topically daily.    . metoprolol succinate (TOPROL-XL) 100 MG 24 hr tablet TAKE 1/2 (ONE-HALF) TABLET BY MOUTH EVERY MORNING (Patient taking differently: TAKE 50 MG BY MOUTH EVERY MORNING.) 30 tablet 4  . naproxen sodium (ANAPROX) 220 MG tablet Take 220 mg by mouth as needed.    Marland Kitchen oxyCODONE-acetaminophen (PERCOCET/ROXICET) 5-325 MG per tablet Take 1 tablet by mouth daily as needed for severe pain. 30 tablet 0  . predniSONE (DELTASONE) 5 MG tablet Take 5 mg by mouth daily with breakfast.    . Probiotic Product (PROBIOTIC DAILY PO) Take 1 tablet by mouth daily. 1 Chew daily.    . traMADol (ULTRAM) 50 MG tablet Take 50 mg by mouth every 6 (six) hours as needed for moderate pain or severe pain.     . TURMERIC PO Take 1 capsule by mouth daily.    . vitamin C (ASCORBIC ACID) 500 MG tablet Take 500 mg by mouth daily.      . [DISCONTINUED] loratadine (CLARITIN) 10 MG tablet Take 10 mg by mouth daily.       No current facility-administered medications for this encounter.     ALLERGIES: Morphine and related and Amoxicillin-pot  clavulanate   LABORATORY DATA:  Lab Results  Component Value Date   WBC 8.9 03/07/2015   HGB 13.4 03/07/2015   HCT 41.7 03/07/2015   MCV 92.1 03/07/2015   PLT 272 03/07/2015   Lab Results  Component Value Date   NA 138 03/07/2015   K 4.2 03/07/2015   CL 104 03/07/2015   CO2 28 03/07/2015   Lab Results  Component Value Date   ALT 21 12/12/2014   AST 24 12/12/2014   ALKPHOS 84 12/12/2014   BILITOT 1.05 12/12/2014     NARRATIVE: CAY KATH was seen today for weekly treatment management. The chart was checked and the patient's films were reviewed.  Weekly rad txs T10-T12 6/10 completed, no c/o pain,, no sob, no nausea, Appetite good, no fatigue 10:23 AM  PHYSICAL EXAMINATION: weight is 124 lb (56.246 kg). Her oral temperature is 98.1 F (36.7 C). Her blood pressure is 158/67 and her pulse is 70. Her respiration is 20 and oxygen saturation is 100%.        ASSESSMENT: The patient is doing satisfactorily with treatment.  PLAN: We will continue with the patient's radiation treatment as planned.      ------------------------------------------------  Jodelle Gross, MD, PhD     This document serves as a record of services personally performed by [name], MD. It was created on his behalf by Derek Mound,  a trained medical scribe. The creation of this record is based on the scribe's personal observations and the provider's statements to them. This document has been checked and approved by the attending provider.

## 2015-04-18 ENCOUNTER — Ambulatory Visit
Admission: RE | Admit: 2015-04-18 | Discharge: 2015-04-18 | Disposition: A | Discharge status: Home / Self Care | Payer: Medicare Other | Source: Ambulatory Visit | Attending: Radiation Oncology | Admitting: Radiation Oncology

## 2015-04-18 DIAGNOSIS — C7951 Secondary malignant neoplasm of bone: Secondary | ICD-10-CM | POA: Diagnosis not present

## 2015-04-19 ENCOUNTER — Ambulatory Visit
Admission: RE | Admit: 2015-04-19 | Discharge: 2015-04-19 | Disposition: A | Discharge status: Home / Self Care | Payer: Medicare Other | Source: Ambulatory Visit | Attending: Radiation Oncology | Admitting: Radiation Oncology

## 2015-04-19 DIAGNOSIS — C7951 Secondary malignant neoplasm of bone: Secondary | ICD-10-CM | POA: Diagnosis not present

## 2015-04-23 ENCOUNTER — Ambulatory Visit
Admission: RE | Admit: 2015-04-23 | Discharge: 2015-04-23 | Disposition: A | Discharge status: Home / Self Care | Payer: Medicare Other | Source: Ambulatory Visit | Attending: Radiation Oncology | Admitting: Radiation Oncology

## 2015-04-23 DIAGNOSIS — C7951 Secondary malignant neoplasm of bone: Secondary | ICD-10-CM | POA: Insufficient documentation

## 2015-04-24 ENCOUNTER — Encounter: Payer: Self-pay | Admitting: *Deleted

## 2015-04-24 ENCOUNTER — Encounter: Payer: Self-pay | Admitting: Radiation Oncology

## 2015-04-24 ENCOUNTER — Ambulatory Visit
Admission: RE | Admit: 2015-04-24 | Discharge: 2015-04-24 | Disposition: A | Discharge status: Home / Self Care | Payer: Medicare Other | Source: Ambulatory Visit | Attending: Radiation Oncology | Admitting: Radiation Oncology

## 2015-04-24 ENCOUNTER — Telehealth: Payer: Self-pay | Admitting: *Deleted

## 2015-04-24 ENCOUNTER — Ambulatory Visit: Payer: Medicare Other

## 2015-04-24 DIAGNOSIS — C7951 Secondary malignant neoplasm of bone: Secondary | ICD-10-CM | POA: Diagnosis not present

## 2015-04-24 NOTE — Progress Notes (Signed)
Spoke with Corene Cornea for pacemaker check.  Pt requested to instruct Corene Cornea that her room is located in [facility] room 406.  Daughter had originally only stated the room number not the building.  Corene Cornea voiced appreciation.

## 2015-04-24 NOTE — Progress Notes (Signed)
PAIN: She is currently in no pain. SWALLOWING/DIET: Pt denies dysphagia. The patient eats a regular, healthy diet.  SKIN: Noted warm and intact. Noted slight dry skin to upper/mid back.  Encouraged using lotion. OTHER: Pt complains of fatigue. Denies numbness or tingling to extremities. Gait is steady.  No falls. [facility]. Today is final treatment.  BP 151/64 mmHg  Pulse 70  Temp(Src) 98.5 F (36.9 C) (Oral)  Resp 12  Wt 123 lb 8 oz (56.019 kg)  SpO2 99% Wt Readings from Last 3 Encounters:  04/24/15 123 lb 8 oz (56.019 kg)  04/17/15 124 lb (56.246 kg)  04/12/15 123 lb 4.8 oz (55.929 kg)

## 2015-04-24 NOTE — Telephone Encounter (Signed)
Called Corene Cornea, who does the pacemaker check,patient eot today, he can go to patient home  At 3pm or patient  to come back for pacer check at 330pm, he is booked until themn, spoke with daughter and she prefers for jason to go to patient's [facility] [address]", Corene Cornea will see patient at 3pm there, called [facility],spoke with Elise,RT therapist

## 2015-04-25 ENCOUNTER — Ambulatory Visit: Payer: Medicare Other

## 2015-05-04 NOTE — Progress Notes (Signed)
  Radiation Oncology         [phone]) [phone] ________________________________  Name: KARRI KALLENBACH MRN: [id number]  Date: 04/03/2015  DOB: 12/12/1924  SIMULATION AND TREATMENT PLANNING NOTE  DIAGNOSIS:  Metastatic breast cancer  Site:  T10-12  NARRATIVE:  The patient was brought to the [facility].  Identity was confirmed.  All relevant records and images related to the planned course of therapy were reviewed.   Written consent to proceed with treatment was confirmed which was freely given after reviewing the details related to the planned course of therapy had been reviewed with the patient.  Then, the patient was set-up in a stable reproducible  supine position for radiation therapy.  CT images were obtained.  Surface markings were placed.    Medically necessary complex treatment device(s) for immobilization:  Vac-lock bag.   The CT images were loaded into the planning software.  Then the target and avoidance structures were contoured.  Treatment planning then occurred.  The radiation prescription was entered and confirmed.  A total of 2 complex treatment devices were fabricated which relate to the designed radiation treatment fields. Each of these customized fields/ complex treatment devices will be used on a daily basis during the radiation course. I have requested : 3D Simulation  I have requested a DVH of the following structures: Target volume, spinal cord, left kidney.   PLAN:  The patient will receive 30 Gy in 10 fractions.  ________________________________   Jodelle Gross, MD, PhD

## 2015-05-04 NOTE — Addendum Note (Signed)
Encounter addended by: [name], MD on: 05/04/2015 12:56 PM<BR>     Documentation filed: Notes Section, Visit Diagnoses

## 2015-05-07 ENCOUNTER — Encounter (HOSPITAL_COMMUNITY): Payer: Self-pay

## 2015-05-21 ENCOUNTER — Encounter: Payer: Self-pay | Admitting: *Deleted

## 2015-05-22 ENCOUNTER — Encounter: Payer: Self-pay | Admitting: Radiation Oncology

## 2015-05-22 ENCOUNTER — Ambulatory Visit
Admission: RE | Admit: 2015-05-22 | Discharge: 2015-05-22 | Disposition: A | Discharge status: Home / Self Care | Payer: Medicare Other | Source: Ambulatory Visit | Attending: Radiation Oncology | Admitting: Radiation Oncology

## 2015-05-22 VITALS — BP 155/57 | HR 77 | Temp 98.4°F | Resp 16 | Ht 60.0 in | Wt 123.8 lb

## 2015-05-22 DIAGNOSIS — C7951 Secondary malignant neoplasm of bone: Secondary | ICD-10-CM

## 2015-05-22 DIAGNOSIS — C7952 Secondary malignant neoplasm of bone marrow: Principal | ICD-10-CM

## 2015-05-22 MED ORDER — OXYCODONE-ACETAMINOPHEN 5-325 MG PO TABS: 1.0000 | ORAL_TABLET | Freq: Every day | ORAL | Status: DC | PRN

## 2015-05-22 NOTE — Progress Notes (Addendum)
Follow up s/p rada tx T-11 on 04/24/15, no c/o pain, but extremely fatigued since radiation , on Arimidex '1mg'$  daily,., requesting refill on Oxycodone-apap 5/'325mg'$  tablet,   Zometa  On 06/12/15 , sees Dr. Jana Hakim 06/12/15 after having  U/S breasts 06/05/15 appetite good,  10:07 AM BP 155/57 mmHg  Pulse 77  Temp(Src) 98.4 F (36.9 C) (Oral)  Resp 16  Ht 5' (1.524 m)  Wt 123 lb 12.8 oz (56.155 kg)  BMI 24.18 kg/m2  SpO2 97%  Wt Readings from Last 3 Encounters:  05/22/15 123 lb 12.8 oz (56.155 kg)  04/24/15 123 lb 8 oz (56.019 kg)  04/17/15 124 lb (56.246 kg)

## 2015-05-22 NOTE — Progress Notes (Signed)
Radiation Oncology         [phone] ________________________________  Name: Lauren Buchanan MRN: [id number]  Date: 05/22/2015  DOB: Mar 08, 1925  Follow-Up Visit Note  CC: Lauren Man, MD  [name], [name], MD  Diagnosis: Lauren Buchanan is a 81 year old female presenting to clinic in regards to her  weekly treatment management in regards of metastatic breast cancer.   Interval Since Last Radiation:  3  weeks  Narrative:  The patient returns today for routine follow-up. Lauren Buchanan is a 81 year old female presenting to clinic in regards to her  weekly treatment management in regards of metastatic breast cancer. The patient denies symptoms of pain, pain or difficulty swallowing, or change in appetite. However, she reports extremely fatigued since radiation. She continues on Arimidex '1mg'$  daily and requesting refill on Oxycodone. "I just want something to give me some energy," the patient shared. The patient projects a healthy mental disposition and was accompanied with her daughter for today's clinical visit. The chart was checked and the patient's films were reviewed. Weekly radiation treatments T10-T12 were completed 03/29/2015. The patient was cold and provided with a heated blanket for today's visit.                     ALLERGIES:  is allergic to morphine and related and amoxicillin-pot clavulanate.  Meds: Current Outpatient Prescriptions  Medication Sig Dispense Refill  . albuterol (PROVENTIL HFA;VENTOLIN HFA) 108 (90 BASE) MCG/ACT inhaler Inhale 2 puffs into the lungs every 6 (six) hours as needed for shortness of breath. 1 Inhaler 2  . anastrozole (ARIMIDEX) 1 MG tablet TAKE 1 TABLET (1 MG TOTAL) BY MOUTH DAILY. 30 tablet 2  . budesonide-formoterol (SYMBICORT) 160-4.5 MCG/ACT inhaler Inhale 2 puffs into the lungs 2 (two) times daily. 3 Inhaler 3  . Calcium Citrate-Vitamin D (CITRACAL PETITES/VITAMIN D) 200-250 MG-UNIT TABS Take 1 tablet by mouth daily.      .  metoprolol succinate (TOPROL-XL) 100 MG 24 hr tablet TAKE 1/2 (ONE-HALF) TABLET BY MOUTH EVERY MORNING (Patient taking differently: TAKE 50 MG BY MOUTH EVERY MORNING.) 30 tablet 4  . naproxen sodium (ANAPROX) 220 MG tablet Take 220 mg by mouth 2 (two) times daily with a meal.     . predniSONE (DELTASONE) 5 MG tablet Take 5 mg by mouth daily with breakfast.    . Probiotic Product (PROBIOTIC DAILY PO) Take 1 tablet by mouth daily. 1 Chew daily.    . traMADol (ULTRAM) 50 MG tablet Take 50 mg by mouth every 6 (six) hours as needed for moderate pain or severe pain.     . TURMERIC PO Take 1 capsule by mouth daily.    . vitamin C (ASCORBIC ACID) 500 MG tablet Take 500 mg by mouth daily.      . hyaluronate sodium (RADIAPLEXRX) GEL Apply 1 application topically daily.    Marland Kitchen oxyCODONE-acetaminophen (PERCOCET/ROXICET) 5-325 MG per tablet Take 1 tablet by mouth daily as needed for severe pain. 30 tablet 0  . [DISCONTINUED] loratadine (CLARITIN) 10 MG tablet Take 10 mg by mouth daily.       No current facility-administered medications for this encounter.    Physical Findings: The patient is in no acute distress and is alert and oriented x3.  height is 5' (1.524 m) and weight is 123 lb 12.8 oz (56.155 kg). Her oral temperature is 98.4 F (36.9 C). Her blood pressure is 155/57 and her pulse is 77. Her respiration is  16 and oxygen saturation is 97%. .  No significant changes in the status of the patient's overall health is noted at this time.  Lab Findings: Lab Results  Component Value Date   WBC 8.9 03/07/2015   WBC 8.2 12/12/2014   HGB 13.4 03/07/2015   HGB 13.8 12/12/2014   HCT 41.7 03/07/2015   HCT 42.4 12/12/2014   PLT 272 03/07/2015   PLT 238 12/12/2014    Lab Results  Component Value Date   NA 138 03/07/2015   NA 139 12/12/2014   K 4.2 03/07/2015   K 4.2 12/12/2014   CHLORIDE 102 12/12/2014   CO2 28 03/07/2015   CO2 30* 12/12/2014   GLUCOSE 79 03/07/2015   GLUCOSE 113 12/12/2014    BUN 14 03/07/2015   BUN 14.4 12/12/2014   CREATININE 0.56 03/07/2015   CREATININE 0.8 12/12/2014   BILITOT 1.05 12/12/2014   BILITOT 0.5 09/27/2014   ALKPHOS 84 12/12/2014   ALKPHOS 66 09/27/2014   AST 24 12/12/2014   AST 30 09/27/2014   ALT 21 12/12/2014   ALT 27 09/27/2014   PROT 6.5 12/12/2014   PROT 6.9 09/27/2014   ALBUMIN 3.5 12/12/2014   ALBUMIN 3.7 09/27/2014   CALCIUM 9.0 03/07/2015   CALCIUM 9.2 12/12/2014   ANIONGAP 6 03/07/2015   ANIONGAP 6 12/12/2014    Radiographic Findings: No results found.  Impression: Lauren Buchanan is a 81 year old female presenting to clinic in regards to her  weekly treatment management in regards of metastatic breast cancer. The patient is recovering from the effects of radiation. The only concerning symptom to be noted via today's visit was extreme fatigue.  Plan: The patient is advised of her appointment with Dr. Jana Hakim to take place on 06/12/2015. A prescription of Oxycodone refill was provided via the patient's request. The patient and her daughter were made aware of follow up appointment to take place with radiation oncology to take place as needed. If the patient develops any further questions and concerns, she has been encouraged to contact with Dr. Lisbeth Renshaw, MD or Dr. Jana Hakim, MD.   This document serves as a record of services personally performed by [name], MD. It was created on his behalf by Lenn Cal, a trained medical scribe. The creation of this record is based on the scribe's personal observations and the provider's statements to them. This document has been checked and approved by the attending provider.  ------------------------------------------------  Jodelle Gross, MD, PhD

## 2015-05-23 DIAGNOSIS — I441 Atrioventricular block, second degree: Secondary | ICD-10-CM | POA: Diagnosis not present

## 2015-06-05 ENCOUNTER — Ambulatory Visit
Admission: RE | Admit: 2015-06-05 | Discharge: 2015-06-05 | Disposition: A | Discharge status: Home / Self Care | Payer: Medicare Other | Source: Ambulatory Visit | Attending: Oncology | Admitting: Oncology

## 2015-06-05 DIAGNOSIS — C50919 Malignant neoplasm of unspecified site of unspecified female breast: Secondary | ICD-10-CM

## 2015-06-05 DIAGNOSIS — C50412 Malignant neoplasm of upper-outer quadrant of left female breast: Secondary | ICD-10-CM

## 2015-06-05 DIAGNOSIS — C7951 Secondary malignant neoplasm of bone: Secondary | ICD-10-CM

## 2015-06-05 DIAGNOSIS — Z853 Personal history of malignant neoplasm of breast: Secondary | ICD-10-CM | POA: Diagnosis not present

## 2015-06-05 DIAGNOSIS — N63 Unspecified lump in breast: Secondary | ICD-10-CM | POA: Diagnosis not present

## 2015-06-05 NOTE — Progress Notes (Signed)
  Radiation Oncology         [phone] ________________________________  Name: Lauren Buchanan MRN: [id number]  Date: 04/24/2015  DOB: [birth year]  End of Treatment Note  Diagnosis:   Metastatic breast cancer with osseous metastasis     Indication for treatment::  palliative       Radiation treatment dates:   04/11/2015 through 04/24/2015  Site/dose:   The patient was treated to the T10-T12 vertebral bodies to a total dose of 30 gray in 10 fractions at 3 gray per fraction. This was accomplished using a 2 field, 3-D conformal technique.  Narrative: The patient tolerated radiation treatment relatively well. No significant difficulties in terms of nausea/diarrhea.  Plan: The patient has completed radiation treatment. The patient will return to radiation oncology clinic for routine followup in one month. I advised the patient to call or return sooner if they have any questions or concerns related to their recovery or treatment. ________________________________  Jodelle Gross, M.D., Ph.D.

## 2015-06-12 ENCOUNTER — Ambulatory Visit (HOSPITAL_BASED_OUTPATIENT_CLINIC_OR_DEPARTMENT_OTHER): Payer: Medicare Other | Admitting: Oncology

## 2015-06-12 ENCOUNTER — Ambulatory Visit (HOSPITAL_BASED_OUTPATIENT_CLINIC_OR_DEPARTMENT_OTHER): Payer: Medicare Other

## 2015-06-12 ENCOUNTER — Other Ambulatory Visit (HOSPITAL_BASED_OUTPATIENT_CLINIC_OR_DEPARTMENT_OTHER): Payer: Medicare Other

## 2015-06-12 ENCOUNTER — Telehealth: Payer: Self-pay | Admitting: Oncology

## 2015-06-12 VITALS — BP 142/60 | HR 76 | Temp 98.3°F | Resp 18 | Ht 60.0 in | Wt 124.1 lb

## 2015-06-12 DIAGNOSIS — C50412 Malignant neoplasm of upper-outer quadrant of left female breast: Secondary | ICD-10-CM | POA: Diagnosis not present

## 2015-06-12 DIAGNOSIS — Z170 Estrogen receptor positive status [ER+]: Secondary | ICD-10-CM | POA: Diagnosis not present

## 2015-06-12 DIAGNOSIS — M810 Age-related osteoporosis without current pathological fracture: Secondary | ICD-10-CM

## 2015-06-12 DIAGNOSIS — C50512 Malignant neoplasm of lower-outer quadrant of left female breast: Secondary | ICD-10-CM

## 2015-06-12 DIAGNOSIS — C773 Secondary and unspecified malignant neoplasm of axilla and upper limb lymph nodes: Secondary | ICD-10-CM | POA: Diagnosis not present

## 2015-06-12 DIAGNOSIS — C7951 Secondary malignant neoplasm of bone: Secondary | ICD-10-CM

## 2015-06-12 DIAGNOSIS — C7952 Secondary malignant neoplasm of bone marrow: Secondary | ICD-10-CM

## 2015-06-12 LAB — CBC WITH DIFFERENTIAL/PLATELET
BASO%: 0.7 % (ref 0.0–2.0)
BASOS ABS: 0 10*3/uL (ref 0.0–0.1)
EOS%: 3.3 % (ref 0.0–7.0)
Eosinophils Absolute: 0.2 10*3/uL (ref 0.0–0.5)
HCT: 41.9 % (ref 34.8–46.6)
HGB: 14.2 g/dL (ref 11.6–15.9)
LYMPH%: 11.3 % — AB (ref 14.0–49.7)
MCH: 30.7 pg (ref 25.1–34.0)
MCHC: 34 g/dL (ref 31.5–36.0)
MCV: 90.2 fL (ref 79.5–101.0)
MONO#: 1.1 10*3/uL — ABNORMAL HIGH (ref 0.1–0.9)
MONO%: 15.3 % — ABNORMAL HIGH (ref 0.0–14.0)
NEUT#: 4.9 10*3/uL (ref 1.5–6.5)
NEUT%: 69.4 % (ref 38.4–76.8)
Platelets: 242 10*3/uL (ref 145–400)
RBC: 4.64 10*6/uL (ref 3.70–5.45)
RDW: 15 % — ABNORMAL HIGH (ref 11.2–14.5)
WBC: 7.1 10*3/uL (ref 3.9–10.3)
lymph#: 0.8 10*3/uL — ABNORMAL LOW (ref 0.9–3.3)

## 2015-06-12 LAB — COMPREHENSIVE METABOLIC PANEL (CC13)
ALT: 27 U/L (ref 0–55)
ANION GAP: 9 meq/L (ref 3–11)
AST: 30 U/L (ref 5–34)
Albumin: 3.6 g/dL (ref 3.5–5.0)
Alkaline Phosphatase: 95 U/L (ref 40–150)
BUN: 14.6 mg/dL (ref 7.0–26.0)
CHLORIDE: 103 meq/L (ref 98–109)
CO2: 27 meq/L (ref 22–29)
CREATININE: 0.8 mg/dL (ref 0.6–1.1)
Calcium: 9.9 mg/dL (ref 8.4–10.4)
EGFR: 66 mL/min/{1.73_m2} — ABNORMAL LOW (ref >90)
Glucose: 121 mg/dl (ref 70–140)
Potassium: 4 mEq/L (ref 3.5–5.1)
Sodium: 140 mEq/L (ref 136–145)
Total Bilirubin: 1.05 mg/dL (ref 0.20–1.20)
Total Protein: 6.6 g/dL (ref 6.4–8.3)

## 2015-06-12 MED ORDER — ZOLEDRONIC ACID 4 MG/5ML IV CONC
3.3000 mg | Freq: Once | INTRAVENOUS | Status: AC
  Administered 2015-06-12: 3.3 mg via INTRAVENOUS
  Filled 2015-06-12: qty 4.13

## 2015-06-12 MED ORDER — SODIUM CHLORIDE 0.9 % IV SOLN
INTRAVENOUS | Status: DC
  Administered 2015-06-12: 14:00:00 via INTRAVENOUS

## 2015-06-12 MED ORDER — ZOLEDRONIC ACID 4 MG/100ML IV SOLN
3.3000 mg | Freq: Once | INTRAVENOUS | Status: DC
  Filled 2015-06-12: qty 83

## 2015-06-12 MED ORDER — OXYCODONE-ACETAMINOPHEN 5-325 MG PO TABS: 1.0000 | ORAL_TABLET | Freq: Every day | ORAL | Status: DC | PRN

## 2015-06-12 NOTE — Patient Instructions (Signed)

## 2015-06-12 NOTE — Progress Notes (Signed)
[facility]  Telephone:([phone]) [phone] Fax:(336) [phone]     ID: Lauren Buchanan DOB: April 20, 1925  MR#: [id number]  VZD#:[id number]  Patient Care Team: Dorena Cookey, MD as PCP - General (Family Medicine) Fanny Skates, MD as Consulting Physician (General Surgery) Chauncey Cruel, MD as Consulting Physician (Oncology) [name], MD as Consulting Physician (Radiation Oncology) Rockwell Germany, RN as Registered Nurse Mauro Kaufmann, RN as Registered Nurse PCP: Joycelyn Man, MD GYN: OTHER MD: Danton Sewer MD, Thompson Grayer MD, Unice Bailey MD, Rolm Bookbinder MD  CHIEF COMPLAINT: bilateral estrogen receptor positive breast cancer, metastatic to bone  CURRENT TREATMENT: Anastrozole, zolendronate  NOTE: Patient requests her daughter Lauren Buchanan be contacted regarding all appointments and procedures. [name] can be reached at [place]: From the original intake note:  "Lauren Buchanan"  Has been aware of a sore right nipple for the last 6-8 months. She attributed it to her washing machine, which rubs that area when she uses it. On  11/21/2014 she saw Dr. 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 She mentioned the chest wall problem and Dr. Ubaldo Glassing obtained to skin biopsies 1 from the right inferior central chest and the other one of from the right mid medial chest.  The second of these showed a nodular basal cell carcinoma. The first one however showed invasive carcinoma consistent with a breast primary. This tumor was estrogen receptor positive at 100%, progesterone receptor positive at 36%, both with strong staining intensity, and HER-2 was amplified , the signals ratio being 6.2. [name] [id number])  On 12/06/2014 the patient underwent bilateral diagnostic mammography with bilateral ultrasonography. Breast density was category B. There was bilateral nipple retraction. In the left breast upper outer quadrant there was a spiculated mass noted  by mammography with a second spiculated mass in the lower inner quadrant. By ultrasound there was a 1.6 cm hypoechoic lesion at the 1:30 o'clock position in the left breast, and a second mass more medially measuring 0.9 cm. Also at the 8:00 position in the left breast 2 cm from the nipple there was a 1.3 cm mass. Ultrasound of the left axilla showed an enlarged lymph node with fatty hilum replacement, measuring 1.4 cm.  In the right breast mammography showed a cluster of indeterminate microcalcifications in the upper outer quadrant and a retroareolar mass, which bile does sound measured 2.9 cm. Ultrasound of the right axilla showed a right axilla lymph node measuring 1.4 cm.  On 12/06/2014 the patient underwent needle core biopsy of 2 separate masses in the left breast (at 1:00 and 8:00) as well as the enlarged axillary lymph node. All 3 were positive for an invasive lobular carcinoma (E-cadherin negative). All 3 tumors were estrogen receptor positive with strong staining intensity (between 87% and 100%). The lymph node was progesterone receptor negative, the other 2 masses being progesterone receptor positive at 9% and at 76%. This cancer was reported to be HER-2 positive at conference today, though I do not have the ratios at hand.  The patient is not an MRI candidate because she has a permanent pacemaker in place. Her subsequent history is as detailed below   INTERVAL HISTORY: Lauren Buchanan returns today for follow up of her breast cancer, accompanied by her daughter Lauren Buchanan. Since the last visit here she underwent radiation to the T10-12 area under Dr. Valere Dross. She tolerated this well, except for fatigue. She does not recall significant skin changes. She is on anastrozole,  which she tolerates well, with no hot flashes or vaginal dryness problems. She has not developed the arthralgias or myalgias that many patients can experience on this medication. She obtains it had a very good price   REVIEW OF SYSTEMS: Lauren Buchanan  has  an efficiency apartment [facility]. She does not participate in any of the exercise programs there yet. She just feels "tired and lazy". She has made some new friends. She specifically denies depression or anxiety. She admits to "not drinking enough during the day". She is receiving a Percocet every morning, which was prescribed by interventional radiology. This is not constipating her. A detailed review of systems today was otherwise stable   PAST MEDICAL HISTORY: Past Medical History  Diagnosis Date  . DJD (degenerative joint disease)   . IBS (irritable bowel syndrome)   . Goiter   . Complete heart block 1999    s/p PPM by Dr Olevia Perches, most recent generator change 2009  . Renal cyst   . COPD (chronic obstructive pulmonary disease)   . Headache(784.0)   . Pacemaker   . Wears glasses   . Hiatal hernia   . Esophageal stricture   . Diverticulosis   . Asthma   . Pneumonia   . Giant cell arteritis   . Cancer of central portion of female breast 12/04/2014  . Cancer of central portion of female breast 12/04/2014  . Breast cancer 12/08/2014    Bilateral  . Chest pain   . GERD (gastroesophageal reflux disease)   . History of general anesthesia complication   . Heart murmur   . Hemorrhoids   . S/P radiation therapy 04/24/15 completed    T-11    PAST SURGICAL HISTORY: Past Surgical History  Procedure Laterality Date  . Pacemaker insertion  1999    Gen change (SJM) by Dr Olevia Perches 2009  . Cholecystectomy    . Left lower lobectomy for brochiectasis  1950  . Hemorrhoidectomy    . Renal cyst excision    . Tonsillectomy    . Fracture surgery      fx lt wrist  . Colonoscopy    . Artery biopsy Left 01/03/2013    Procedure: BIOPSY LEFT TEMPORAL ARTERY;  Surgeon: Ascencion Dike, MD;  Location: [facility];  Service: ENT;  Laterality: Left;  . Anal fissure repair    . Breast biopsy Right   . Rectal polypectomy    . Mouth surgery      FAMILY HISTORY Family History  Problem  Relation Age of Onset  . Heart disease Mother   . Breast cancer Mother   . Stomach cancer Father   . Seizures Son   . Thyroid disease Sister   . Alcohol abuse Father    the patient's father died at the age of 33, with stomach cancer. The patient's mother died at the age of 67 from a myocardial infarction. She had been diagnosed with breast cancer in her 77s. The patient had no brothers, 2 sisters. There is no other history of breast or ovarian cancer in the family to the patient's knowledge  GYNECOLOGIC HISTORY:  No LMP recorded. Patient is postmenopausal. Menarche age 69, first live birth age 59. The patient is GX P4. She underwent menopause in her late 76s. She did not take hormone replacement.  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 She is a widow, lives by herself, with no pets. Daughter Wendy Poet lives in [place] where she is Mudlogger of a preschool and day care. Son Sunaina Ferrando lives in [place] where he owns a furniture to sign store. Daughter Shelly Rubenstein lives in [place]. She owns a Chiropractor. Son Randall Hiss is physically and mentally disabled and lives in a group home in [place]. The patient has 7 grandchildren and 2 great-grandchildren. She attends a local [place]: In place; the patient's daughter Lauren Buchanan is her healthcare power of attorney. She can be reached at [place]: Social History  Substance Use Topics  . Smoking status: Never Smoker   . Smokeless tobacco: Never Used  . Alcohol Use: Not on file     Comment: Occasional      Colonoscopy:  PAP:  Bone density:02/19/2015, osteoporosis  Lipid panel:  Allergies  Allergen Reactions  . Morphine And Related Nausea Only  . Amoxicillin-Pot Clavulanate Other (See Comments)    Unknown     Current Outpatient Prescriptions  Medication Sig Dispense Refill  . albuterol (PROVENTIL HFA;VENTOLIN HFA) 108 (90 BASE) MCG/ACT inhaler  Inhale 2 puffs into the lungs every 6 (six) hours as needed for shortness of breath. 1 Inhaler 2  . anastrozole (ARIMIDEX) 1 MG tablet TAKE 1 TABLET (1 MG TOTAL) BY MOUTH DAILY. 30 tablet 2  . budesonide-formoterol (SYMBICORT) 160-4.5 MCG/ACT inhaler Inhale 2 puffs into the lungs 2 (two) times daily. 3 Inhaler 3  . Calcium Citrate-Vitamin D (CITRACAL PETITES/VITAMIN D) 200-250 MG-UNIT TABS Take 1 tablet by mouth daily.      . hyaluronate sodium (RADIAPLEXRX) GEL Apply 1 application topically daily.    . metoprolol succinate (TOPROL-XL) 100 MG 24 hr tablet TAKE 1/2 (ONE-HALF) TABLET BY MOUTH EVERY MORNING (Patient taking differently: TAKE 50 MG BY MOUTH EVERY MORNING.) 30 tablet 4  . naproxen sodium (ANAPROX) 220 MG tablet Take 220 mg by mouth 2 (two) times daily with a meal.     . oxyCODONE-acetaminophen (PERCOCET/ROXICET) 5-325 MG per tablet Take 1 tablet by mouth daily as needed for severe pain. 30 tablet 0  . predniSONE (DELTASONE) 5 MG tablet Take 5 mg by mouth daily with breakfast.    . Probiotic Product (PROBIOTIC DAILY PO) Take 1 tablet by mouth daily. 1 Chew daily.    . traMADol (ULTRAM) 50 MG tablet Take 50 mg by mouth every 6 (six) hours as needed for moderate pain or severe pain.     . TURMERIC PO Take 1 capsule by mouth daily.    . vitamin C (ASCORBIC ACID) 500 MG tablet Take 500 mg by mouth daily.      . [DISCONTINUED] loratadine (CLARITIN) 10 MG tablet Take 10 mg by mouth daily.       No current facility-administered medications for this visit.    OBJECTIVE: Elderly white womanin who appears stated age 81 Vitals:   06/12/15 1236  BP: 142/60  Pulse: 76  Temp: 98.3 F (36.8 C)  Resp: 18     Body mass index is 24.24 kg/(m^2).    ECOG FS:1 - Symptomatic but completely ambulatory  Sclerae unicteric, pupils round and equal Oropharynx clear and moist-- no thrush or other lesions No cervical or supraclavicular adenopathy Lungs no rales or rhonchi Heart regular rate and  rhythm Abd soft, nontender, positive bowel sounds MSK no focal spinal tenderness including palpation over the T11 area, no upper extremity lymphedema Neuro: nonfocal, well oriented, appropriate affect Breasts: The right breast is status post multiple biopsies. There continues to be induration  particularly above and medial to the nipple. There is a small area of erythema at the 3:00 position laterally in the right breast approximately 4 cm from the areola. This is associated with a small subcutaneous area of induration. The right axilla is benign. The left breast is unremarkable     LAB RESULTS:  CMP     Component Value Date/Time   NA 138 03/07/2015 0925   NA 139 12/12/2014 1149   K 4.2 03/07/2015 0925   K 4.2 12/12/2014 1149   CL 104 03/07/2015 0925   CO2 28 03/07/2015 0925   CO2 30* 12/12/2014 1149   GLUCOSE 79 03/07/2015 0925   GLUCOSE 113 12/12/2014 1149   BUN 14 03/07/2015 0925   BUN 14.4 12/12/2014 1149   CREATININE 0.56 03/07/2015 0925   CREATININE 0.8 12/12/2014 1149   CALCIUM 9.0 03/07/2015 0925   CALCIUM 9.2 12/12/2014 1149   PROT 6.5 12/12/2014 1149   PROT 6.9 09/27/2014 1600   ALBUMIN 3.5 12/12/2014 1149   ALBUMIN 3.7 09/27/2014 1600   AST 24 12/12/2014 1149   AST 30 09/27/2014 1600   ALT 21 12/12/2014 1149   ALT 27 09/27/2014 1600   ALKPHOS 84 12/12/2014 1149   ALKPHOS 66 09/27/2014 1600   BILITOT 1.05 12/12/2014 1149   BILITOT 0.5 09/27/2014 1600   GFRNONAA >60 03/07/2015 0925   GFRAA >60 03/07/2015 0925    INo results found for: SPEP, UPEP  Lab Results  Component Value Date   WBC 7.1 06/12/2015   NEUTROABS 4.9 06/12/2015   HGB 14.2 06/12/2015   HCT 41.9 06/12/2015   MCV 90.2 06/12/2015   PLT 242 06/12/2015      Chemistry      Component Value Date/Time   NA 138 03/07/2015 0925   NA 139 12/12/2014 1149   K 4.2 03/07/2015 0925   K 4.2 12/12/2014 1149   CL 104 03/07/2015 0925   CO2 28 03/07/2015 0925   CO2 30* 12/12/2014 1149   BUN 14  03/07/2015 0925   BUN 14.4 12/12/2014 1149   CREATININE 0.56 03/07/2015 0925   CREATININE 0.8 12/12/2014 1149      Component Value Date/Time   CALCIUM 9.0 03/07/2015 0925   CALCIUM 9.2 12/12/2014 1149   ALKPHOS 84 12/12/2014 1149   ALKPHOS 66 09/27/2014 1600   AST 24 12/12/2014 1149   AST 30 09/27/2014 1600   ALT 21 12/12/2014 1149   ALT 27 09/27/2014 1600   BILITOT 1.05 12/12/2014 1149   BILITOT 0.5 09/27/2014 1600       No results found for: LABCA2  No components found for: LABCA125  No results for input(s): INR in the last 168 hours.  Urinalysis    Component Value Date/Time   COLORURINE YELLOW 09/27/2014 1800   APPEARANCEUR CLEAR 09/27/2014 1800   LABSPEC 1.005 09/27/2014 1800   PHURINE 7.0 09/27/2014 1800   GLUCOSEU NEGATIVE 09/27/2014 1800   HGBUR NEGATIVE 09/27/2014 1800   HGBUR trace-intact 01/17/2010 0932   BILIRUBINUR NEGATIVE 09/27/2014 1800   BILIRUBINUR n 09/01/2011 1157   KETONESUR NEGATIVE 09/27/2014 1800   PROTEINUR NEGATIVE 09/27/2014 1800   PROTEINUR n 09/01/2011 1157   UROBILINOGEN 0.2 09/27/2014 1800   UROBILINOGEN 0.2 09/01/2011 1157   NITRITE NEGATIVE 09/27/2014 1800   NITRITE n 09/01/2011 1157   LEUKOCYTESUR TRACE* 09/27/2014 1800    STUDIES: US Breast Ltd Uni Left Inc Axilla  06/05/2015   CLINICAL DATA:  81 year old female with history of bilateral invasive breast cancers. Currently undergoing  treatment with hormonal therapy only. Evaluation is for response to treatment.  EXAM: ULTRASOUND OF THE BILATERAL BREAST  COMPARISON:  Previous exam(s).  FINDINGS: On physical exam of the subareolar right breast there is a firm palpable mass slightly lateral to the nipple. There is mild nipple retraction. Evaluation of the upper-outer left breast demonstrates an area of thickening, without discrete mass. In the 8 o'clock location of the left breast, no discrete mass is palpated however there is focal pain with compression.  Ultrasound of the right breast  demonstrates a persistent mass in the subareolar location measuring approximately 2.2 x 1.8 x 1.2 cm. Previously, this measured 1.7 x 1.6 x 1.1 cm in May, and 2.9 x 2.1 x 1.7 cm in February. As the subareolar location is a difficult area to image due to shadowing behind the nipple, the slight variation in measurement may be due to operator error when comparing the current and most recent prior exam. There is clearly reduction in size since the February 2016 exam. There is no significant interval change in the appearance of the low right axillary lymph node.  Ultrasound of the left breast demonstrates a small irregular shadowing mass at 8 o'clock, 2 cm from the nipple measuring 0.6 x 0.5 x 0.4 cm, previously measuring 0.7 x 0.4 x 0.4 cm in May, and 1.3 x 1.0 x 0.7 cm in February. Ultrasound of the 130 o'clock location of the left breast demonstrates a persistent irregular hypoechoic shadowing mass measuring 1.5 x 1.3 x 1.1 cm, previously measuring 1.4 x 1.2 x 1.1 cm. This appears unchanged since the February 2016 exam.  IMPRESSION: Overall, no significant interval change in the 2 left breast masses and the 1 right subareolar breast mass as well as the low right axillary lymph node as compared to the most recent prior exam. There has been clear reduction in size of the subareolar right breast mass and the left breast mass at 8 o'clock since the February 2016 exam.  RECOMMENDATION: Continued treatment plan.  I have discussed the findings and recommendations with the patient. Results were also provided in writing at the conclusion of the visit. If applicable, a reminder letter will be sent to the patient regarding the next appointment.  BI-RADS CATEGORY  6: Known biopsy-proven malignancy - appropriate action should be taken.   Electronically Signed   By: Ammie Ferrier M.D.   On: 06/05/2015 12:07   US Breast Ltd Uni Right Inc Axilla  06/05/2015   CLINICAL DATA:  81 year old female with history of bilateral  invasive breast cancers. Currently undergoing treatment with hormonal therapy only. Evaluation is for response to treatment.  EXAM: ULTRASOUND OF THE BILATERAL BREAST  COMPARISON:  Previous exam(s).  FINDINGS: On physical exam of the subareolar right breast there is a firm palpable mass slightly lateral to the nipple. There is mild nipple retraction. Evaluation of the upper-outer left breast demonstrates an area of thickening, without discrete mass. In the 8 o'clock location of the left breast, no discrete mass is palpated however there is focal pain with compression.  Ultrasound of the right breast demonstrates a persistent mass in the subareolar location measuring approximately 2.2 x 1.8 x 1.2 cm. Previously, this measured 1.7 x 1.6 x 1.1 cm in May, and 2.9 x 2.1 x 1.7 cm in February. As the subareolar location is a difficult area to image due to shadowing behind the nipple, the slight variation in measurement may be due to operator error when comparing the current and most recent  prior exam. There is clearly reduction in size since the February 2016 exam. There is no significant interval change in the appearance of the low right axillary lymph node.  Ultrasound of the left breast demonstrates a small irregular shadowing mass at 8 o'clock, 2 cm from the nipple measuring 0.6 x 0.5 x 0.4 cm, previously measuring 0.7 x 0.4 x 0.4 cm in May, and 1.3 x 1.0 x 0.7 cm in February. Ultrasound of the 130 o'clock location of the left breast demonstrates a persistent irregular hypoechoic shadowing mass measuring 1.5 x 1.3 x 1.1 cm, previously measuring 1.4 x 1.2 x 1.1 cm. This appears unchanged since the February 2016 exam.  IMPRESSION: Overall, no significant interval change in the 2 left breast masses and the 1 right subareolar breast mass as well as the low right axillary lymph node as compared to the most recent prior exam. There has been clear reduction in size of the subareolar right breast mass and the left breast mass  at 8 o'clock since the February 2016 exam.  RECOMMENDATION: Continued treatment plan.  I have discussed the findings and recommendations with the patient. Results were also provided in writing at the conclusion of the visit. If applicable, a reminder letter will be sent to the patient regarding the next appointment.  BI-RADS CATEGORY  6: Known biopsy-proven malignancy - appropriate action should be taken.   Electronically Signed   By: Ammie Ferrier M.D.   On: 06/05/2015 12:07     ASSESSMENT: 81 y.o. [place] woman with bilateral breast cancer, as follows:  (1) status post right chest skin biopsy 11/21/2014 for a clinical T4 N1, stage IIIB invasive ductal carcinoma, estrogen and progesterone receptor positive, HER-2 amplified  (2) status post 2 separate left breast biopsies and one left axillary lymph node biopsy 12/04/2014, all positive for a clinical mT1c N1, stage IIA invasive lobular carcinoma, estrogen receptor positive, progesterone receptor variable, with MIB-1 between 10 and 40%, and no HER-2 amplification  (3) neoadjuvant anastrozole started 12/14/2014  (4) definitive surgery to consist of bilateral mastectomies without formal axillary dissection: Postponed  (5) PET scan 12/19/2014 shows in addition to the bilateral breast and bilateral axillary masses, a lesion at the T11 vertebral body, showing an irregular lucency on prior CT exam  (a) adjuvant radiationt to T10-T12: 04/11/2015 through 04/24/2015 Site/dose: The patient was treated to the T10-T12 vertebral bodies to a total dose of 30 gray in 10 fractions at 3 gray per fraction. This was accomplished using a 2 field, 3-D conformal technique.  (6) consider anti-HER-2 treatment depending on response to antiestrogen therapy alone   (7) osteoporosis with T score of -2.6 on bone scan obtained 02/19/2015.  (a) zolendronate started 03/20/2015, repeated 06/12/2015   PLAN: Lauren Buchanan is tolerating the anastrozole with no side effects, and  obtains another good price. She also had no problems with his alendronate. We are repeating the zolendronate today and then we will obtain a bone scan before she sees me again in 3 months from now. At that time we will decide whether to continue the zolendronate every 6 months or different intervals.  She has recovered well from the T11 radiation, and I can elicit no tenderness by exam and no symptoms of disease progression by the review of systems.  I have strongly encouraged her to begin participating in one of the exercise programs at her retirement home. It is the only way she is can pick up a little bit of energy.  We reviewed the results  of the breast ultrasound in detail and the patient and her daughter can see that we have stable disease. Control is the goal. We do not need shrinkage, although that certainly would be welcome. [facility] we don't see obvious tumor progression the plan is to continue the current treatment. We will likely repeat a breast ultrasound sometime before the end of the year  They have a good understanding of this plan. They agree with it. They will call with any problems that may develop before her next visit here.     Chauncey Cruel, MD   06/12/2015 12:44 PM

## 2015-06-12 NOTE — Telephone Encounter (Signed)
Appointments made and avs printed for patient °

## 2015-06-18 ENCOUNTER — Other Ambulatory Visit: Payer: Self-pay | Admitting: Oncology

## 2015-07-03 ENCOUNTER — Other Ambulatory Visit: Payer: Self-pay | Admitting: Family Medicine

## 2015-07-30 DIAGNOSIS — Z23 Encounter for immunization: Secondary | ICD-10-CM | POA: Diagnosis not present

## 2015-08-14 ENCOUNTER — Telehealth: Payer: Self-pay | Admitting: *Deleted

## 2015-08-14 ENCOUNTER — Telehealth: Payer: Self-pay | Admitting: Oncology

## 2015-08-14 ENCOUNTER — Other Ambulatory Visit: Payer: Self-pay | Admitting: *Deleted

## 2015-08-14 MED ORDER — OXYCODONE-ACETAMINOPHEN 5-325 MG PO TABS: 1.0000 | ORAL_TABLET | Freq: Every day | ORAL | Status: DC | PRN

## 2015-08-14 NOTE — Telephone Encounter (Signed)
Daughter called in to reschedule the 11/16 appointments to the week before,i left her a message with new appointments and left the number for central scheduling

## 2015-08-14 NOTE — Telephone Encounter (Signed)
[name] called. Needs refill of Percocet.  Please call [phone] when ready

## 2015-08-16 ENCOUNTER — Telehealth: Payer: Self-pay | Admitting: Oncology

## 2015-08-16 NOTE — Telephone Encounter (Signed)
Spoke with patients daughter and we have rescheduled her appointments

## 2015-08-28 ENCOUNTER — Ambulatory Visit: Payer: Medicare Other

## 2015-08-28 ENCOUNTER — Other Ambulatory Visit: Payer: Medicare Other

## 2015-08-30 ENCOUNTER — Encounter (HOSPITAL_COMMUNITY)
Admission: RE | Admit: 2015-08-30 | Discharge: 2015-08-30 | Disposition: A | Discharge status: Home / Self Care | Payer: Medicare Other | Source: Ambulatory Visit | Attending: Oncology | Admitting: Oncology

## 2015-08-30 ENCOUNTER — Ambulatory Visit: Payer: Medicare Other

## 2015-08-30 ENCOUNTER — Other Ambulatory Visit (HOSPITAL_BASED_OUTPATIENT_CLINIC_OR_DEPARTMENT_OTHER): Payer: Medicare Other

## 2015-08-30 ENCOUNTER — Other Ambulatory Visit: Payer: Self-pay | Admitting: Oncology

## 2015-08-30 VITALS — BP 147/61 | HR 70 | Temp 97.6°F | Resp 18

## 2015-08-30 DIAGNOSIS — C50412 Malignant neoplasm of upper-outer quadrant of left female breast: Secondary | ICD-10-CM | POA: Diagnosis not present

## 2015-08-30 DIAGNOSIS — C7951 Secondary malignant neoplasm of bone: Secondary | ICD-10-CM

## 2015-08-30 DIAGNOSIS — M47812 Spondylosis without myelopathy or radiculopathy, cervical region: Secondary | ICD-10-CM | POA: Diagnosis not present

## 2015-08-30 DIAGNOSIS — C7952 Secondary malignant neoplasm of bone marrow: Secondary | ICD-10-CM

## 2015-08-30 DIAGNOSIS — C50419 Malignant neoplasm of upper-outer quadrant of unspecified female breast: Secondary | ICD-10-CM | POA: Diagnosis not present

## 2015-08-30 LAB — CBC WITH DIFFERENTIAL/PLATELET
BASO%: 0.6 % (ref 0.0–2.0)
BASOS ABS: 0 10*3/uL (ref 0.0–0.1)
EOS%: 2.2 % (ref 0.0–7.0)
Eosinophils Absolute: 0.1 10*3/uL (ref 0.0–0.5)
HCT: 41.2 % (ref 34.8–46.6)
HGB: 13.3 g/dL (ref 11.6–15.9)
LYMPH%: 10.6 % — AB (ref 14.0–49.7)
MCH: 29.5 pg (ref 25.1–34.0)
MCHC: 32.4 g/dL (ref 31.5–36.0)
MCV: 91 fL (ref 79.5–101.0)
MONO#: 0.9 10*3/uL (ref 0.1–0.9)
MONO%: 14.7 % — ABNORMAL HIGH (ref 0.0–14.0)
NEUT#: 4.5 10*3/uL (ref 1.5–6.5)
NEUT%: 71.9 % (ref 38.4–76.8)
PLATELETS: 244 10*3/uL (ref 145–400)
RBC: 4.53 10*6/uL (ref 3.70–5.45)
RDW: 14 % (ref 11.2–14.5)
WBC: 6.2 10*3/uL (ref 3.9–10.3)
lymph#: 0.7 10*3/uL — ABNORMAL LOW (ref 0.9–3.3)

## 2015-08-30 LAB — COMPREHENSIVE METABOLIC PANEL (CC13)
ALBUMIN: 3.6 g/dL (ref 3.5–5.0)
ALK PHOS: 86 U/L (ref 40–150)
ALT: 23 U/L (ref 0–55)
AST: 25 U/L (ref 5–34)
Anion Gap: 10 mEq/L (ref 3–11)
BUN: 13.5 mg/dL (ref 7.0–26.0)
CHLORIDE: 104 meq/L (ref 98–109)
CO2: 26 meq/L (ref 22–29)
Calcium: 9.1 mg/dL (ref 8.4–10.4)
Creatinine: 0.8 mg/dL (ref 0.6–1.1)
EGFR: 71 mL/min/{1.73_m2} — AB (ref >90)
GLUCOSE: 103 mg/dL (ref 70–140)
POTASSIUM: 3.9 meq/L (ref 3.5–5.1)
Sodium: 140 mEq/L (ref 136–145)
Total Bilirubin: 0.8 mg/dL (ref 0.20–1.20)
Total Protein: 6.4 g/dL (ref 6.4–8.3)

## 2015-08-30 LAB — CANCER ANTIGEN 27.29: CA 27.29: 10 U/mL (ref 0–39)

## 2015-08-30 MED ORDER — ZOLEDRONIC ACID 4 MG/5ML IV CONC
3.3000 mg | Freq: Once | INTRAVENOUS | Status: DC
  Filled 2015-08-30: qty 4.13

## 2015-08-30 MED ORDER — SODIUM CHLORIDE 0.9 % IV SOLN: Freq: Once | INTRAVENOUS | Status: DC

## 2015-08-30 MED ORDER — TECHNETIUM TC 99M MEDRONATE IV KIT
25.0000 | PACK | Freq: Once | INTRAVENOUS | Status: AC | PRN
  Administered 2015-08-30: 25 via INTRAVENOUS

## 2015-08-30 NOTE — Progress Notes (Signed)
Pt was not feeling well prior to hanging Zometa. Pt came from [facility] med to have her injection ready before bone scan at 1130. Attempted to insert IV unsuccessfully x2. Pt states that she doesn't feel very well. Daughter states that pt did eat a small breakfast. Provided pt with nourishment and see if she improves. BP on the higher side as well 150's sys. Pt denies any other symptoms at this time. Told daughter to come back after bone scan and see if she is able to tolerate getting treatment (Zometa) today.   13- Pt declined to return to office for Zometa. Will reschedule for 11/22 after office visit.

## 2015-08-30 NOTE — Progress Notes (Signed)
Pt had bone scan this morning. Reviewed with Dr. Jana Hakim: Proceed with Zometa today, pt to follow up as scheduled to discuss results, decide whether to change interval for Zometa.

## 2015-09-01 ENCOUNTER — Telehealth: Payer: Self-pay | Admitting: Oncology

## 2015-09-01 NOTE — Telephone Encounter (Signed)
zometa added to 11/22 md visit per pof  anne

## 2015-09-04 ENCOUNTER — Ambulatory Visit: Payer: Medicare Other

## 2015-09-04 ENCOUNTER — Encounter (HOSPITAL_COMMUNITY): Payer: Medicare Other

## 2015-09-04 ENCOUNTER — Ambulatory Visit (HOSPITAL_COMMUNITY): Payer: Medicare Other

## 2015-09-04 ENCOUNTER — Other Ambulatory Visit: Payer: Medicare Other

## 2015-09-10 ENCOUNTER — Ambulatory Visit: Payer: Medicare Other

## 2015-09-10 ENCOUNTER — Telehealth: Payer: Self-pay | Admitting: Oncology

## 2015-09-10 ENCOUNTER — Other Ambulatory Visit: Payer: Self-pay | Admitting: Oncology

## 2015-09-10 ENCOUNTER — Ambulatory Visit (HOSPITAL_BASED_OUTPATIENT_CLINIC_OR_DEPARTMENT_OTHER): Payer: Medicare Other | Admitting: Oncology

## 2015-09-10 VITALS — BP 134/44 | HR 70 | Temp 97.8°F | Resp 18 | Ht 60.0 in | Wt 126.0 lb

## 2015-09-10 DIAGNOSIS — C50919 Malignant neoplasm of unspecified site of unspecified female breast: Secondary | ICD-10-CM

## 2015-09-10 DIAGNOSIS — C7951 Secondary malignant neoplasm of bone: Secondary | ICD-10-CM

## 2015-09-10 DIAGNOSIS — C50412 Malignant neoplasm of upper-outer quadrant of left female breast: Secondary | ICD-10-CM

## 2015-09-10 DIAGNOSIS — N6310 Unspecified lump in the right breast, unspecified quadrant: Secondary | ICD-10-CM

## 2015-09-10 NOTE — Progress Notes (Signed)
Lauren Buchanan  [phone] [phone]     ID: Lauren Buchanan DOB: [birth year]  MR#: [id number]  AJG#:[id number]  Patient Care Team: Dorena Cookey, MD as PCP - General (Family Medicine) Fanny Skates, MD as Consulting Physician (General Surgery) Chauncey Cruel, MD as Consulting Physician (Oncology) [name], MD as Consulting Physician (Radiation Oncology) Rockwell Germany, RN as Registered Nurse Mauro Kaufmann, RN as Registered Nurse PCP: Lauren Man, MD GYN: OTHER MD: Lauren Sewer MD, Lauren Grayer MD, Lauren Bailey MD, Lauren Bookbinder MD  CHIEF COMPLAINT: bilateral estrogen receptor positive breast cancer, metastatic to bone  CURRENT TREATMENT: Anastrozole, zolendronate  NOTE: Patient requests her daughter Lauren Buchanan be contacted regarding all appointments and procedures. Lauren Buchanan can be reached at [place]: From the original intake note:  "Lauren Buchanan"  Has been aware of a sore right nipple for the last 6-8 months. She attributed it to her washing machine, which rubs that area when she uses it. On  11/21/2014 she saw Dr. 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 She mentioned the chest wall problem and Dr. Ubaldo Glassing obtained to skin biopsies 1 from the right inferior central chest and the other one of from the right mid medial chest.  The second of these showed a nodular basal cell carcinoma. The first one however showed invasive carcinoma consistent with a breast primary. This tumor was estrogen receptor positive at 100%, progesterone receptor positive at 36%, both with strong staining intensity, and HER-2 was amplified , the signals ratio being 6.2. [name] [id number])  On 12/06/2014 the patient underwent bilateral diagnostic mammography with bilateral ultrasonography. Breast density was category B. There was bilateral nipple retraction. In the left breast upper outer quadrant there was a spiculated mass noted  by mammography with a second spiculated mass in the lower inner quadrant. By ultrasound there was a 1.6 cm hypoechoic lesion at the 1:30 o'clock position in the left breast, and a second mass more medially measuring 0.9 cm. Also at the 8:00 position in the left breast 2 cm from the nipple there was a 1.3 cm mass. Ultrasound of the left axilla showed an enlarged lymph node with fatty hilum replacement, measuring 1.4 cm.  In the right breast mammography showed a cluster of indeterminate microcalcifications in the upper outer quadrant and a retroareolar mass, which bile does sound measured 2.9 cm. Ultrasound of the right axilla showed a right axilla lymph node measuring 1.4 cm.  On 12/06/2014 the patient underwent needle core biopsy of 2 separate masses in the left breast (at 1:00 and 8:00) as well as the enlarged axillary lymph node. All 3 were positive for an invasive lobular carcinoma (E-cadherin negative). All 3 tumors were estrogen receptor positive with strong staining intensity (between 87% and 100%). The lymph node was progesterone receptor negative, the other 2 masses being progesterone receptor positive at 9% and at 76%. This cancer was reported to be HER-2 positive at conference today, though I do not have the ratios at hand.  The patient is not an MRI candidate because she has a permanent pacemaker in place. Her subsequent history is as detailed below   INTERVAL HISTORY: Lauren Buchanan returns today for follow up of her stage IV breast cancer, accompanied by her daughter Lauren Buchanan. .Continues on anastrozole, which she is tolerating well. She doesn't have hot flashes or night sweats. She does not experience arthralgias or myalgias. Vaginal dryness is not an issue. Since her  last visit here she had a restaging bone scan which shows uptake only at T11, the site of prior biopsy and irradiation. This is very favorable.  REVIEW OF SYSTEMS: Lauren Buchanan feels "fatigued". She is not taking walks daily or participating in the  exercise activities available at friends home's. She has had some loose bowel movements. She feels horse at times, but denies significant breathing problems. She bruises easily. She is forgetful. A detailed review of systems today was otherwise stable.  PAST MEDICAL HISTORY: Past Medical History  Diagnosis Date  . DJD (degenerative joint disease)   . IBS (irritable bowel syndrome)   . Goiter   . Complete heart block 1999    s/p PPM by Dr Olevia Perches, most recent generator change 2009  . Renal cyst   . COPD (chronic obstructive pulmonary disease)   . Headache(784.0)   . Pacemaker   . Wears glasses   . Hiatal hernia   . Esophageal stricture   . Diverticulosis   . Asthma   . Pneumonia   . Giant cell arteritis   . Cancer of central portion of female breast 12/04/2014  . Cancer of central portion of female breast 12/04/2014  . Breast cancer 12/08/2014    Bilateral  . Chest pain   . GERD (gastroesophageal reflux disease)   . History of general anesthesia complication   . Heart murmur   . Hemorrhoids   . S/P radiation therapy 04/24/15 completed    T-11    PAST SURGICAL HISTORY: Past Surgical History  Procedure Laterality Date  . Pacemaker insertion  1999    Gen change (SJM) by Dr Olevia Perches 2009  . Cholecystectomy    . Left lower lobectomy for brochiectasis  1950  . Hemorrhoidectomy    . Renal cyst excision    . Tonsillectomy    . Fracture surgery      fx lt wrist  . Colonoscopy    . Artery biopsy Left 01/03/2013    Procedure: BIOPSY LEFT TEMPORAL ARTERY;  Surgeon: Ascencion Dike, MD;  Location: [place];  Service: ENT;  Laterality: Left;  . Anal fissure repair    . Breast biopsy Right   . Rectal polypectomy    . Mouth surgery      FAMILY HISTORY Family History  Problem Relation Age of Onset  . Heart disease Mother   . Breast cancer Mother   . Stomach cancer Father   . Seizures Son   . Thyroid disease Sister   . Alcohol abuse Father    the patient's father died  at the age of 63, with stomach cancer. The patient's mother died at the age of 69 from a myocardial infarction. She had been diagnosed with breast cancer in her 68s. The patient had no brothers, 2 sisters. There is no other history of breast or ovarian cancer in the family to the patient's knowledge  GYNECOLOGIC HISTORY:  No LMP recorded. Patient is postmenopausal. Menarche age 89, first live birth age 7. The patient is GX P4. She underwent menopause in her late 65s. She did not take hormone replacement.  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 She is a widow, lives by herself, with no pets. Daughter Lauren Buchanan lives in [place] where she is Mudlogger of a preschool and day care. Son Lauren Buchanan lives in [place] where he owns a furniture to sign store. Daughter Lauren Buchanan lives in [place]. She owns a Chief Operating Officer  agency. Son Lauren Buchanan is physically and mentally disabled and lives in a group home in [place]. The patient has 7 grandchildren and 2 great-grandchildren. She attends a local [place]: In place; the patient's daughter Lauren Buchanan is her healthcare power of attorney. She can be reached at [place]: Social History  Substance Use Topics  . Smoking status: Never Smoker   . Smokeless tobacco: Never Used  . Alcohol Use: Not on file     Comment: Occasional      Colonoscopy:  PAP:  Bone density:02/19/2015, osteoporosis  Lipid panel:  Allergies  Allergen Reactions  . Morphine And Related Nausea Only  . Amoxicillin-Pot Clavulanate Other (See Comments)    Unknown     Current Outpatient Prescriptions  Medication Sig Dispense Refill  . albuterol (PROVENTIL HFA;VENTOLIN HFA) 108 (90 BASE) MCG/ACT inhaler Inhale 2 puffs into the lungs every 6 (six) hours as needed for shortness of breath. 1 Inhaler 2  . anastrozole (ARIMIDEX) 1 MG tablet TAKE 1 TABLET (1 MG TOTAL) BY MOUTH DAILY. 30 tablet 1  .  budesonide-formoterol (SYMBICORT) 160-4.5 MCG/ACT inhaler Inhale 2 puffs into the lungs 2 (two) times daily. 3 Inhaler 3  . Calcium Citrate-Vitamin D (CITRACAL PETITES/VITAMIN D) 200-250 MG-UNIT TABS Take 1 tablet by mouth daily.      . hyaluronate sodium (RADIAPLEXRX) GEL Apply 1 application topically daily.    . metoprolol succinate (TOPROL-XL) 100 MG 24 hr tablet TAKE 1/2 (ONE-HALF) TABLET BY MOUTH EVERY MORNING (Patient taking differently: TAKE 50 MG BY MOUTH EVERY MORNING.) 30 tablet 4  . metoprolol succinate (TOPROL-XL) 100 MG 24 hr tablet TAKE 1/2 (ONE-HALF) TABLET BY MOUTH EVERY MORNING 30 tablet 4  . naproxen sodium (ANAPROX) 220 MG tablet Take 220 mg by mouth 2 (two) times daily with a meal.     . oxyCODONE-acetaminophen (PERCOCET/ROXICET) 5-325 MG tablet Take 1 tablet by mouth daily as needed for severe pain. 60 tablet 0  . predniSONE (DELTASONE) 5 MG tablet Take 5 mg by mouth daily with breakfast.    . Probiotic Product (PROBIOTIC DAILY PO) Take 1 tablet by mouth daily. 1 Chew daily.    . traMADol (ULTRAM) 50 MG tablet Take 50 mg by mouth every 6 (six) hours as needed for moderate pain or severe pain.     . TURMERIC PO Take 1 capsule by mouth daily.    . vitamin C (ASCORBIC ACID) 500 MG tablet Take 500 mg by mouth daily.      . [DISCONTINUED] loratadine (CLARITIN) 10 MG tablet Take 10 mg by mouth daily.       No current facility-administered medications for this visit.    OBJECTIVE: Elderly white womanin in no acute distress Filed Vitals:   09/10/15 1405  BP: 134/44  Pulse: 70  Temp: 97.8 F (36.6 C)  Resp: 18     Body mass index is 24.61 kg/(m^2).    ECOG FS:1 - Symptomatic but completely ambulatory  Sclerae unicteric, EOMs intact Oropharynx clear, slightly dry No cervical or supraclavicular adenopathy Lungs no rales or rhonchi Heart regular rate and rhythm Abd soft, nontender, positive bowel sounds MSK no focal spinal tenderness, no upper extremity lymphedema Neuro:  nonfocal, appears well oriented, appropriate affect Breasts: The right breast appears softer and the palpable mass is smaller than prior. There is no erythema. The right axilla is benign. The left breast is unremarkable.  LAB RESULTS:  CMP     Component Value Date/Time  NA 140 08/30/2015 0927   NA 138 03/07/2015 0925   K 3.9 08/30/2015 0927   K 4.2 03/07/2015 0925   CL 104 03/07/2015 0925   CO2 26 08/30/2015 0927   CO2 28 03/07/2015 0925   GLUCOSE 103 08/30/2015 0927   GLUCOSE 79 03/07/2015 0925   BUN 13.5 08/30/2015 0927   BUN 14 03/07/2015 0925   CREATININE 0.8 08/30/2015 0927   CREATININE 0.56 03/07/2015 0925   CALCIUM 9.1 08/30/2015 0927   CALCIUM 9.0 03/07/2015 0925   PROT 6.4 08/30/2015 0927   PROT 6.9 09/27/2014 1600   ALBUMIN 3.6 08/30/2015 0927   ALBUMIN 3.7 09/27/2014 1600   AST 25 08/30/2015 0927   AST 30 09/27/2014 1600   ALT 23 08/30/2015 0927   ALT 27 09/27/2014 1600   ALKPHOS 86 08/30/2015 0927   ALKPHOS 66 09/27/2014 1600   BILITOT 0.80 08/30/2015 0927   BILITOT 0.5 09/27/2014 1600   GFRNONAA >60 03/07/2015 0925   GFRAA >60 03/07/2015 0925    INo results found for: SPEP, UPEP  Lab Results  Component Value Date   WBC 6.2 08/30/2015   NEUTROABS 4.5 08/30/2015   HGB 13.3 08/30/2015   HCT 41.2 08/30/2015   MCV 91.0 08/30/2015   PLT 244 08/30/2015      Chemistry      Component Value Date/Time   NA 140 08/30/2015 0927   NA 138 03/07/2015 0925   K 3.9 08/30/2015 0927   K 4.2 03/07/2015 0925   CL 104 03/07/2015 0925   CO2 26 08/30/2015 0927   CO2 28 03/07/2015 0925   BUN 13.5 08/30/2015 0927   BUN 14 03/07/2015 0925   CREATININE 0.8 08/30/2015 0927   CREATININE 0.56 03/07/2015 0925      Component Value Date/Time   CALCIUM 9.1 08/30/2015 0927   CALCIUM 9.0 03/07/2015 0925   ALKPHOS 86 08/30/2015 0927   ALKPHOS 66 09/27/2014 1600   AST 25 08/30/2015 0927   AST 30 09/27/2014 1600   ALT 23 08/30/2015 0927   ALT 27 09/27/2014 1600    BILITOT 0.80 08/30/2015 0927   BILITOT 0.5 09/27/2014 1600       Lab Results  Component Value Date   LABCA2 10 08/30/2015    No components found for: [id number]  No results for input(s): INR in the last 168 hours.  Urinalysis    Component Value Date/Time   COLORURINE YELLOW 09/27/2014 1800   APPEARANCEUR CLEAR 09/27/2014 1800   LABSPEC 1.005 09/27/2014 1800   PHURINE 7.0 09/27/2014 1800   GLUCOSEU NEGATIVE 09/27/2014 1800   HGBUR NEGATIVE 09/27/2014 1800   HGBUR trace-intact 01/17/2010 0932   BILIRUBINUR NEGATIVE 09/27/2014 1800   BILIRUBINUR n 09/01/2011 1157   KETONESUR NEGATIVE 09/27/2014 1800   PROTEINUR NEGATIVE 09/27/2014 1800   PROTEINUR n 09/01/2011 1157   UROBILINOGEN 0.2 09/27/2014 1800   UROBILINOGEN 0.2 09/01/2011 1157   NITRITE NEGATIVE 09/27/2014 1800   NITRITE n 09/01/2011 1157   LEUKOCYTESUR TRACE* 09/27/2014 1800    STUDIES: Nm Bone Scan Whole Body  08/30/2015  CLINICAL DATA:  History of breast malignancy, no skeletal symptoms or history of skeletal surgery or trauma. EXAM: NUCLEAR MEDICINE WHOLE BODY BONE SCAN TECHNIQUE: Whole body anterior and posterior images were obtained approximately 3 hours after intravenous injection of radiopharmaceutical. RADIOPHARMACEUTICALS:  Twenty-six mCi Technetium-36m MDP IV COMPARISON:  PET-CT study of December 19, 2014 FINDINGS: There is adequate uptake of the radiopharmaceutical by the skeleton. Adequate soft tissue clearance and renal activity is  observed. There is a focal area of increased uptake within the T11 vertebral body which corresponds to the known sclerotic lesion here. Elsewhere the uptake in the thoracic and lumbar spines is normal. A focus of increased uptake to the left of midline is present in the mid cervical spine. Uptake over the ribs, sternum, upper extremities, pelvis, and lower extremities is within the limits of normal. There is mildly increased uptake in the left fifth toe. IMPRESSION: 1. Increased uptake  in the body of T11 which corresponds to a known sclerotic lesion which has been biopsied. 2. Mildly increased uptake to the left of midline in the mid cervical spine and in the the right fifth toe most compatible with degenerative change. Plain radiographs of these regions would be useful. Electronically Signed   By: David  Martinique M.D.   On: 08/30/2015 12:01     ASSESSMENT: 81 y.o. [place] woman with bilateral breast cancer, as follows:  (1) status post right chest skin biopsy 11/21/2014 for a clinical T4 N1, stage IIIB invasive ductal carcinoma, estrogen and progesterone receptor positive, HER-2 amplified  (2) status post 2 separate left breast biopsies and one left axillary lymph node biopsy 12/04/2014, all positive for a clinical mT1c N1, stage IIA invasive lobular carcinoma, estrogen receptor positive, progesterone receptor variable, with MIB-1 between 10 and 40%, and no HER-2 amplification  (3) neoadjuvant anastrozole started 12/14/2014  (4) definitive surgery to consist of bilateral mastectomies without formal axillary dissection: Postponed  (5) PET scan 12/19/2014 shows in addition to the bilateral breast and bilateral axillary masses, a lesion at the T11 vertebral body, showing an irregular lucency on prior CT exam  (a) adjuvant radiationt to T10-T12: 04/11/2015 through 04/24/2015 Site/dose: The patient was treated to the T10-T12 vertebral bodies to a total dose of 30 gray in 10 fractions at 3 gray per fraction. This was accomplished using a 2 field, 3-D conformal technique.  (6) consider anti-HER-2 treatment depending on response to antiestrogen therapy alone   (7) osteoporosis with T score of -2.6 on bone scan obtained 02/19/2015.  (a) zolendronate started 03/20/2015, repeated 06/12/2015   PLAN: Lauren Buchanan's bone scan shows no other areas of metastatic disease then T11, which has been irradiated. This is a little "hotter" on scan, likely indicating healing associated with the  zolendronate the fact that there are no other lesions despite her having received 2 doses of zolendronate is very favorable, as zolendronate generally discloses lytic lesions not otherwise seen on bone scan.  Lauren Buchanan dd not want to receive the zolendronate today because they had significant axis problems last time. Possibly this is due to dehydration and I have encouraged her to drink "a lot of fluids" before she comes for her next visit here.  The anastrozole appears to be working in that the left breast mass appears softer and smaller to me than last visit. She continues to tolerate it well. We are avoiding anti-HER-2 treatment for now given her cardiac history.  She will return to see me late January and we will try to be able to treat her with zolendronate that day. I am setting her up for bilateral breast ultrasonography in December.  In the meantime I encouraged her to participate in the safe exercise activities available at friends home's. Her daughter will help [place] follow up on this  They know to call for any problems that may develop before her next visit here.    Chauncey Cruel, MD   09/10/2015 2:29 PM

## 2015-09-23 ENCOUNTER — Encounter: Payer: Self-pay | Admitting: Oncology

## 2015-09-23 ENCOUNTER — Ambulatory Visit
Admission: RE | Admit: 2015-09-23 | Discharge: 2015-09-23 | Disposition: A | Discharge status: Home / Self Care | Payer: Medicare Other | Source: Ambulatory Visit | Attending: Oncology | Admitting: Oncology

## 2015-09-23 DIAGNOSIS — N6310 Unspecified lump in the right breast, unspecified quadrant: Secondary | ICD-10-CM

## 2015-09-23 DIAGNOSIS — R001 Bradycardia, unspecified: Secondary | ICD-10-CM | POA: Diagnosis not present

## 2015-09-23 DIAGNOSIS — R921 Mammographic calcification found on diagnostic imaging of breast: Secondary | ICD-10-CM | POA: Diagnosis not present

## 2015-09-23 DIAGNOSIS — N63 Unspecified lump in breast: Secondary | ICD-10-CM | POA: Diagnosis not present

## 2015-09-23 DIAGNOSIS — I441 Atrioventricular block, second degree: Secondary | ICD-10-CM | POA: Diagnosis not present

## 2015-09-25 ENCOUNTER — Encounter: Payer: Self-pay | Admitting: Oncology

## 2015-09-25 ENCOUNTER — Other Ambulatory Visit: Payer: Self-pay | Admitting: Oncology

## 2015-10-15 ENCOUNTER — Encounter: Payer: Self-pay | Admitting: *Deleted

## 2015-10-16 ENCOUNTER — Other Ambulatory Visit: Payer: Self-pay | Admitting: Oncology

## 2015-10-18 DIAGNOSIS — M316 Other giant cell arteritis: Secondary | ICD-10-CM | POA: Diagnosis not present

## 2015-10-18 DIAGNOSIS — M0609 Rheumatoid arthritis without rheumatoid factor, multiple sites: Secondary | ICD-10-CM | POA: Diagnosis not present

## 2015-10-18 DIAGNOSIS — C50919 Malignant neoplasm of unspecified site of unspecified female breast: Secondary | ICD-10-CM | POA: Diagnosis not present

## 2015-10-18 DIAGNOSIS — M810 Age-related osteoporosis without current pathological fracture: Secondary | ICD-10-CM | POA: Diagnosis not present

## 2015-11-07 ENCOUNTER — Telehealth: Payer: Self-pay | Admitting: Internal Medicine

## 2015-11-07 ENCOUNTER — Telehealth: Payer: Self-pay

## 2015-11-07 MED ORDER — AZITHROMYCIN 250 MG PO TABS: 250.0000 mg | ORAL_TABLET | ORAL | Status: DC

## 2015-11-07 NOTE — Telephone Encounter (Signed)
Offer Z pak  Ok to Re-establish her with any of our providers since Dr Gwenette Greet is gone

## 2015-11-07 NOTE — Telephone Encounter (Signed)
Pt c/o cough with light Toy Eisemann mucus production, SOB, wheezing and chest tightness x 3 days. Denies fever/chills. Has not tried OTC cough syrup for cough.  Allergies  Allergen Reactions  . Morphine And Related Nausea Only  . Amoxicillin-Pot Clavulanate Other (See Comments)    Unknown    Last seen 08/2014 by [facility]. Will need OV to establish with new Pulm. MD. Pt requesting something be called in for her acute symptoms at this time.  Please advise CY. Thanks.

## 2015-11-07 NOTE — Telephone Encounter (Signed)
Spoke with Loma Sousa at [facility], needed to clarify Rx instructions for ZPAK Advised that she is to take this 2 tablets on day 1, then 1 tablets daily thereafter.  Nothing further needed.

## 2015-11-07 NOTE — Telephone Encounter (Signed)
Pt aware that Zpak sent to [facility])  Called disconnected before OV scheduled.  LM x 1 for pt to schedule  Needs to be scheduled with Pulm physician FIRST AVAILABLE to establish care with new MD.

## 2015-11-08 NOTE — Telephone Encounter (Signed)
lmtcb X2 for pt.  

## 2015-11-11 NOTE — Telephone Encounter (Signed)
Pt is scheduled to see PM 12/13/15. Nothing further needed

## 2015-11-19 ENCOUNTER — Encounter: Payer: Self-pay | Admitting: Nurse Practitioner

## 2015-11-19 ENCOUNTER — Telehealth: Payer: Self-pay | Admitting: Nurse Practitioner

## 2015-11-19 ENCOUNTER — Ambulatory Visit (HOSPITAL_BASED_OUTPATIENT_CLINIC_OR_DEPARTMENT_OTHER): Payer: Medicare Other

## 2015-11-19 ENCOUNTER — Other Ambulatory Visit (HOSPITAL_BASED_OUTPATIENT_CLINIC_OR_DEPARTMENT_OTHER): Payer: Medicare Other

## 2015-11-19 ENCOUNTER — Other Ambulatory Visit: Payer: Self-pay | Admitting: *Deleted

## 2015-11-19 ENCOUNTER — Ambulatory Visit (HOSPITAL_BASED_OUTPATIENT_CLINIC_OR_DEPARTMENT_OTHER): Payer: Medicare Other | Admitting: Nurse Practitioner

## 2015-11-19 VITALS — BP 151/69 | HR 82 | Temp 97.5°F | Resp 18 | Ht 60.0 in | Wt 128.0 lb

## 2015-11-19 DIAGNOSIS — C50312 Malignant neoplasm of lower-inner quadrant of left female breast: Secondary | ICD-10-CM

## 2015-11-19 DIAGNOSIS — M810 Age-related osteoporosis without current pathological fracture: Secondary | ICD-10-CM

## 2015-11-19 DIAGNOSIS — C50412 Malignant neoplasm of upper-outer quadrant of left female breast: Secondary | ICD-10-CM | POA: Diagnosis not present

## 2015-11-19 DIAGNOSIS — C7952 Secondary malignant neoplasm of bone marrow: Secondary | ICD-10-CM

## 2015-11-19 DIAGNOSIS — C7951 Secondary malignant neoplasm of bone: Secondary | ICD-10-CM

## 2015-11-19 LAB — CBC WITH DIFFERENTIAL/PLATELET
BASO%: 0.5 % (ref 0.0–2.0)
Basophils Absolute: 0 10*3/uL (ref 0.0–0.1)
EOS ABS: 0.1 10*3/uL (ref 0.0–0.5)
EOS%: 1.6 % (ref 0.0–7.0)
HEMATOCRIT: 41.6 % (ref 34.8–46.6)
HEMOGLOBIN: 13.6 g/dL (ref 11.6–15.9)
LYMPH#: 0.8 10*3/uL — AB (ref 0.9–3.3)
LYMPH%: 11.2 % — ABNORMAL LOW (ref 14.0–49.7)
MCH: 29.9 pg (ref 25.1–34.0)
MCHC: 32.7 g/dL (ref 31.5–36.0)
MCV: 91.3 fL (ref 79.5–101.0)
MONO#: 0.8 10*3/uL (ref 0.1–0.9)
MONO%: 11 % (ref 0.0–14.0)
NEUT%: 75.7 % (ref 38.4–76.8)
NEUTROS ABS: 5.5 10*3/uL (ref 1.5–6.5)
PLATELETS: 266 10*3/uL (ref 145–400)
RBC: 4.56 10*6/uL (ref 3.70–5.45)
RDW: 14 % (ref 11.2–14.5)
WBC: 7.2 10*3/uL (ref 3.9–10.3)

## 2015-11-19 LAB — COMPREHENSIVE METABOLIC PANEL
ALBUMIN: 3.7 g/dL (ref 3.5–5.0)
ALK PHOS: 87 U/L (ref 40–150)
ALT: 23 U/L (ref 0–55)
ANION GAP: 9 meq/L (ref 3–11)
AST: 27 U/L (ref 5–34)
BILIRUBIN TOTAL: 0.93 mg/dL (ref 0.20–1.20)
BUN: 11.4 mg/dL (ref 7.0–26.0)
CALCIUM: 9.2 mg/dL (ref 8.4–10.4)
CO2: 26 meq/L (ref 22–29)
CREATININE: 0.9 mg/dL (ref 0.6–1.1)
Chloride: 100 mEq/L (ref 98–109)
EGFR: 56 mL/min/{1.73_m2} — ABNORMAL LOW (ref >90)
Glucose: 112 mg/dl (ref 70–140)
Potassium: 4.2 mEq/L (ref 3.5–5.1)
Sodium: 135 mEq/L — ABNORMAL LOW (ref 136–145)
TOTAL PROTEIN: 6.9 g/dL (ref 6.4–8.3)

## 2015-11-19 MED ORDER — SODIUM CHLORIDE 0.9 % IV SOLN
3.3000 mg | Freq: Once | INTRAVENOUS | Status: AC
  Administered 2015-11-19: 3.3 mg via INTRAVENOUS
  Filled 2015-11-19: qty 4.13

## 2015-11-19 MED ORDER — ANASTROZOLE 1 MG PO TABS: 1.0000 mg | ORAL_TABLET | Freq: Every day | ORAL | Status: DC

## 2015-11-19 MED ORDER — SODIUM CHLORIDE 0.9 % IV SOLN
Freq: Once | INTRAVENOUS | Status: AC
  Administered 2015-11-19: 12:00:00 via INTRAVENOUS

## 2015-11-19 NOTE — Patient Instructions (Signed)

## 2015-11-19 NOTE — Progress Notes (Signed)
[place]  [phone] [phone]) [phone]     ID: Lauren Buchanan DOB: 1925-05-08  MR#: [id number]  KPT#:[id number]  Patient Care Team: Lauren Cookey, MD as PCP - General (Family Medicine) Lauren Skates, MD as Consulting Physician (General Surgery) Lauren Cruel, MD as Consulting Physician (Oncology) Lauren Rudd, MD as Consulting Physician (Radiation Oncology) Lauren Germany, RN as Registered Nurse Lauren Kaufmann, RN as Registered Nurse PCP: Lauren Man, MD GYN: OTHER MD: Lauren Sewer MD, Lauren Grayer MD, Lauren Bailey MD, Lauren Bookbinder MD  CHIEF COMPLAINT: bilateral estrogen receptor positive breast cancer, metastatic to bone  CURRENT TREATMENT: Anastrozole, zolendronate  NOTE: Patient requests her daughter Lauren Buchanan be contacted regarding all appointments and procedures. Lauren Buchanan can be reached at [place]: From the original intake note:  "Lauren Buchanan"  Has been aware of a sore right nipple for the last 6-8 months. She attributed it to her washing machine, which rubs that area when she uses it. 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 She mentioned the chest wall problem and Lauren Buchanan obtained to skin biopsies 1 from the right inferior central chest and the other one of from the right mid medial chest.  The second of these showed a nodular basal cell carcinoma. The first one however showed invasive carcinoma consistent with a breast primary. This tumor was estrogen receptor positive at 100%, progesterone receptor positive at 36%, both with strong staining intensity, and HER-2 was amplified , the signals ratio being 6.2. Lauren Buchanan [id number])  On 12/06/2014 the patient underwent bilateral diagnostic mammography with bilateral ultrasonography. Breast density was category B. There was bilateral nipple retraction. In the left breast upper outer quadrant there was a spiculated mass noted  by mammography with a second spiculated mass in the lower inner quadrant. By ultrasound there was a 1.6 cm hypoechoic lesion at the 1:30 o'clock position in the left breast, and a second mass more medially measuring 0.9 cm. Also at the 8:00 position in the left breast 2 cm from the nipple there was a 1.3 cm mass. Ultrasound of the left axilla showed an enlarged lymph node with fatty hilum replacement, measuring 1.4 cm.  In the right breast mammography showed a cluster of indeterminate microcalcifications in the upper outer quadrant and a retroareolar mass, which bile does sound measured 2.9 cm. Ultrasound of the right axilla showed a right axilla lymph node measuring 1.4 cm.  On 12/06/2014 the patient underwent needle core biopsy of 2 separate masses in the left breast (at 1:00 and 8:00) as well as the enlarged axillary lymph node. All 3 were positive for an invasive lobular carcinoma (E-cadherin negative). All 3 tumors were estrogen receptor positive with strong staining intensity (between 87% and 100%). The lymph node was progesterone receptor negative, the other 2 masses being progesterone receptor positive at 9% and at 76%. This cancer was reported to be HER-2 positive at conference today, though I do not have the ratios at hand.  The patient is not an MRI candidate because she has a permanent pacemaker in place. Her subsequent history is as detailed below   INTERVAL HISTORY: Lauren Buchanan returns today for follow up of her stage IV breast cancer, accompanied by her granddaughter, Lauren Buchanan. [name] has been on anastrozole daily for almost 1 year now with no side effects that she is aware of. She denies hot flashes, vaginal changes, or arthralgias/myalgias. The interval history  is generally unremarkable.  REVIEW OF SYSTEMS: Lauren Buchanan has few complaints today. She is in no pain. She is recovering from a sinus infection and finished a course of antibiotics and mucinex. She denies fevers, chills, nausea, vomiting, or  changes in bowel or bladder habits. She has no shortness of breath, chest pain, cough, or palpitations. She has had no issues with her pacemaker. It has not gone off. She is very forgetful. She bruises easily, but denies abnormal bleeding. Her mood is stable. She is sleeping well. She denies headaches, dizziness, or vision changes.  A detailed review of systems is otherwise stable.  PAST MEDICAL HISTORY: Past Medical History  Diagnosis Date  . DJD (degenerative joint disease)   . IBS (irritable bowel syndrome)   . Goiter   . Complete heart block 1999    s/p PPM by Dr Olevia Perches, most recent generator change 2009  . Renal cyst   . COPD (chronic obstructive pulmonary disease)   . Headache(784.0)   . Pacemaker   . Wears glasses   . Hiatal hernia   . Esophageal stricture   . Diverticulosis   . Asthma   . Pneumonia   . Giant cell arteritis   . Cancer of central portion of female breast 12/04/2014  . Cancer of central portion of female breast 12/04/2014  . Breast cancer 12/08/2014    Bilateral  . Chest pain   . GERD (gastroesophageal reflux disease)   . History of general anesthesia complication   . Heart murmur   . Hemorrhoids   . S/P radiation therapy 04/24/15 completed    T-11    PAST SURGICAL HISTORY: Past Surgical History  Procedure Laterality Date  . Pacemaker insertion  1999    Gen change (SJM) by Dr Olevia Perches 2009  . Cholecystectomy    . Left lower lobectomy for brochiectasis  1950  . Hemorrhoidectomy    . Renal cyst excision    . Tonsillectomy    . Fracture surgery      fx lt wrist  . Colonoscopy    . Artery biopsy Left 01/03/2013    Procedure: BIOPSY LEFT TEMPORAL ARTERY;  Surgeon: Ascencion Dike, MD;  Location: [facility];  Service: ENT;  Laterality: Left;  . Anal fissure repair    . Breast biopsy Right   . Rectal polypectomy    . Mouth surgery      FAMILY HISTORY Family History  Problem Relation Age of Onset  . Heart disease Mother   . Breast cancer  Mother   . Stomach cancer Father   . Seizures Son   . Thyroid disease Sister   . Alcohol abuse Father    the patient's father died at the age of 39, with stomach cancer. The patient's mother died at the age of 32 from a myocardial infarction. She had been diagnosed with breast cancer in her 31s. The patient had no brothers, 2 sisters. There is no other history of breast or ovarian cancer in the family to the patient's knowledge  GYNECOLOGIC HISTORY:  No LMP recorded. Patient is postmenopausal. Menarche age 55, first live birth age 39. The patient is GX P4. She underwent menopause in her late 30s. She did not take hormone replacement.  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 She is a widow, lives by herself, with no pets. Daughter Lauren Buchanan lives in [place] where she is Mudlogger of a preschool and day care. Son Lauren Flavin  Buchanan lives in [place] where he owns a furniture to sign store. Daughter Lauren Buchanan lives in [place]. She owns a Theme park manager. Son Lauren Buchanan is physically and mentally disabled and lives in a group home in [place]. The patient has 7 grandchildren and 2 great-grandchildren. She attends a local [employer]    ADVANCED DIRECTIVES: In place; the patient's daughter Lauren Buchanan is her healthcare power of attorney. She can be reached at [phone]   HEALTH MAINTENANCE: Social History  Substance Use Topics  . Smoking status: Never Smoker   . Smokeless tobacco: Never Used  . Alcohol Use: Not on file     Comment: Occasional      Colonoscopy:  PAP:  Bone density:02/19/2015, osteoporosis  Lipid panel:  Allergies  Allergen Reactions  . Morphine And Related Nausea Only  . Amoxicillin-Pot Clavulanate Other (See Comments)    Unknown     Current Outpatient Prescriptions  Medication Sig Dispense Refill  . Calcium Citrate-Vitamin D (CITRACAL PETITES/VITAMIN D) 200-250 MG-UNIT TABS Take 1 tablet by mouth daily.      . metoprolol succinate  (TOPROL-XL) 100 MG 24 hr tablet TAKE 1/2 (ONE-HALF) TABLET BY MOUTH EVERY MORNING 30 tablet 4  . naproxen sodium (ANAPROX) 220 MG tablet Take 220 mg by mouth 2 (two) times daily with a meal.     . Probiotic Product (PROBIOTIC DAILY PO) Take 1 tablet by mouth daily. 1 Chew daily.    . TURMERIC PO Take 1 capsule by mouth daily.    . vitamin C (ASCORBIC ACID) 500 MG tablet Take 500 mg by mouth daily.      Marland Kitchen albuterol (PROVENTIL HFA;VENTOLIN HFA) 108 (90 BASE) MCG/ACT inhaler Inhale 2 puffs into the lungs every 6 (six) hours as needed for shortness of breath. (Patient not taking: Reported on 11/19/2015) 1 Inhaler 2  . anastrozole (ARIMIDEX) 1 MG tablet Take 1 tablet (1 mg total) by mouth daily. 90 tablet 3  . budesonide-formoterol (SYMBICORT) 160-4.5 MCG/ACT inhaler Inhale 2 puffs into the lungs 2 (two) times daily. (Patient not taking: Reported on 11/19/2015) 3 Inhaler 3  . oxyCODONE-acetaminophen (PERCOCET/ROXICET) 5-325 MG tablet TAKE 1 TABLET BY MOUTH DAILY AS NEEDED FOR SEVERE PAIN (Patient not taking: Reported on 11/19/2015) 60 tablet 0  . predniSONE (DELTASONE) 5 MG tablet Take 5 mg by mouth daily with breakfast. Reported on 11/19/2015    . traMADol (ULTRAM) 50 MG tablet Take 50 mg by mouth every 6 (six) hours as needed for moderate pain or severe pain. Reported on 11/19/2015    . [DISCONTINUED] loratadine (CLARITIN) 10 MG tablet Take 10 mg by mouth daily.       No current facility-administered medications for this visit.    OBJECTIVE: Elderly white womanin in no acute distress Filed Vitals:   11/19/15 1039  BP: 151/69  Pulse: 82  Temp: 97.5 F (36.4 C)  Resp: 18     Body mass index is 25 kg/(m^2).    ECOG FS:1 - Symptomatic but completely ambulatory  Skin: warm, dry  HEENT: sclerae anicteric, conjunctivae pink, oropharynx clear. No thrush or mucositis.  Lymph Nodes: No cervical or supraclavicular lymphadenopathy  Lungs: clear to auscultation bilaterally, no rales, wheezes, or rhonci    Heart: regular rate and rhythm  Abdomen: round, soft, non tender, positive bowel sounds  Musculoskeletal: No focal spinal tenderness, no peripheral edema  Neuro: non focal, well oriented, positive affect  Breasts: bilateral breasts status post multiple biopsies. Tenderness throughout exam, but breasts are soft. I was unable  to palpate a mass in either breast. Flat erythematous area at 3:00 position on right breast. No nipple changes. Bilateral axilla benign.    LAB RESULTS:  CMP     Component Value Date/Time   NA 135* 11/19/2015 1022   NA 138 03/07/2015 0925   K 4.2 11/19/2015 1022   K 4.2 03/07/2015 0925   CL 104 03/07/2015 0925   CO2 26 11/19/2015 1022   CO2 28 03/07/2015 0925   GLUCOSE 112 11/19/2015 1022   GLUCOSE 79 03/07/2015 0925   BUN 11.4 11/19/2015 1022   BUN 14 03/07/2015 0925   CREATININE 0.9 11/19/2015 1022   CREATININE 0.56 03/07/2015 0925   CALCIUM 9.2 11/19/2015 1022   CALCIUM 9.0 03/07/2015 0925   PROT 6.9 11/19/2015 1022   PROT 6.9 09/27/2014 1600   ALBUMIN 3.7 11/19/2015 1022   ALBUMIN 3.7 09/27/2014 1600   AST 27 11/19/2015 1022   AST 30 09/27/2014 1600   ALT 23 11/19/2015 1022   ALT 27 09/27/2014 1600   ALKPHOS 87 11/19/2015 1022   ALKPHOS 66 09/27/2014 1600   BILITOT 0.93 11/19/2015 1022   BILITOT 0.5 09/27/2014 1600   GFRNONAA >60 03/07/2015 0925   GFRAA >60 03/07/2015 0925    INo results found for: SPEP, UPEP  Lab Results  Component Value Date   WBC 7.2 11/19/2015   NEUTROABS 5.5 11/19/2015   HGB 13.6 11/19/2015   HCT 41.6 11/19/2015   MCV 91.3 11/19/2015   PLT 266 11/19/2015      Chemistry      Component Value Date/Time   NA 135* 11/19/2015 1022   NA 138 03/07/2015 0925   K 4.2 11/19/2015 1022   K 4.2 03/07/2015 0925   CL 104 03/07/2015 0925   CO2 26 11/19/2015 1022   CO2 28 03/07/2015 0925   BUN 11.4 11/19/2015 1022   BUN 14 03/07/2015 0925   CREATININE 0.9 11/19/2015 1022   CREATININE 0.56 03/07/2015 0925       Component Value Date/Time   CALCIUM 9.2 11/19/2015 1022   CALCIUM 9.0 03/07/2015 0925   ALKPHOS 87 11/19/2015 1022   ALKPHOS 66 09/27/2014 1600   AST 27 11/19/2015 1022   AST 30 09/27/2014 1600   ALT 23 11/19/2015 1022   ALT 27 09/27/2014 1600   BILITOT 0.93 11/19/2015 1022   BILITOT 0.5 09/27/2014 1600       Lab Results  Component Value Date   LABCA2 10 08/30/2015    No components found for: [id number]  No results for input(s): INR in the last 168 hours.  Urinalysis    Component Value Date/Time   COLORURINE YELLOW 09/27/2014 1800   APPEARANCEUR CLEAR 09/27/2014 1800   LABSPEC 1.005 09/27/2014 1800   PHURINE 7.0 09/27/2014 1800   GLUCOSEU NEGATIVE 09/27/2014 1800   HGBUR NEGATIVE 09/27/2014 1800   HGBUR trace-intact 01/17/2010 0932   BILIRUBINUR NEGATIVE 09/27/2014 1800   BILIRUBINUR n 09/01/2011 1157   KETONESUR NEGATIVE 09/27/2014 1800   PROTEINUR NEGATIVE 09/27/2014 1800   PROTEINUR n 09/01/2011 1157   UROBILINOGEN 0.2 09/27/2014 1800   UROBILINOGEN 0.2 09/01/2011 1157   NITRITE NEGATIVE 09/27/2014 1800   NITRITE n 09/01/2011 1157   LEUKOCYTESUR TRACE* 09/27/2014 1800    STUDIES: No results found.  EXAM: DIGITAL DIAGNOSTIC choose 3 MAMMOGRAM WITH 3D TOMOSYNTHESIS WITH CAD  ULTRASOUND RIGHT BREAST  COMPARISON: Previous exam(s).  ACR Breast Density Category b: There are scattered areas of fibroglandular density.  FINDINGS: There is no mammographic change in the  appearance of the subareolar right breast. The grouped indeterminate calcifications within the upper right breast, measuring 6 mm extent, are stable.  Mammographic images were processed with CAD.  Targeted ultrasound is performed of the subareolar right breast. The subareolar right breast mass measures 1.4 x 0.9 x 1.1 cm, decreased in size from previous measurements of 2.2 x 1.2 x 1.8 cm.  IMPRESSION: Decreased size of the subareolar right breast mass with a current measurement of  1.4 x 0.9 x 1.1 cm, indicating good response to treatment.  Grouped indeterminate calcifications within the upper right breast are stable.  RECOMMENDATION: Per current treatment plan.  I have discussed the findings and recommendations with the patient. Results were also provided in writing at the conclusion of the visit. If applicable, a reminder letter will be sent to the patient regarding the next appointment.  BI-RADS CATEGORY 6: Known biopsy-proven malignancy.  Electronically Signed: By: Franki Cabot M.D. On: 09/23/2015 12:10   ASSESSMENT: 82 y.o. [place] woman with bilateral breast cancer, as follows:  (1) status post right chest skin biopsy 11/21/2014 for a clinical T4 N1, stage IIIB invasive ductal carcinoma, estrogen and progesterone receptor positive, HER-2 amplified  (2) status post 2 separate left breast biopsies and one left axillary lymph node biopsy 12/04/2014, all positive for a clinical mT1c N1, stage IIA invasive lobular carcinoma, estrogen receptor positive, progesterone receptor variable, with MIB-1 between 10 and 40%, and no HER-2 amplification  (3) neoadjuvant anastrozole started 12/14/2014  (4) definitive surgery to consist of bilateral mastectomies without formal axillary dissection: Postponed  (5) PET scan 12/19/2014 shows in addition to the bilateral breast and bilateral axillary masses, a lesion at the T11 vertebral body, showing an irregular lucency on prior CT exam  (a) adjuvant radiationt to T10-T12: 04/11/2015 through 04/24/2015 Site/dose: The patient was treated to the T10-T12 vertebral bodies to a total dose of 30 gray in 10 fractions at 3 gray per fraction. This was accomplished using a 2 field, 3-D conformal technique.  (6) consider anti-HER-2 treatment depending on response to antiestrogen therapy alone. On hold.   (7) osteoporosis with T score of -2.6 on bone scan obtained 02/19/2015.  (a) zolendronate started 03/20/2015, repeated  06/12/2015   PLAN: Lauren Buchanan is doing well today. We reviewed the results her right breast ultrasound from December. This showed an improvement to the subareolar right breast mass, indicating a good response to treatment. She is tolerating the anastrozole well, and will continue this drug until there is evidence of progression. She is due for her next dose of zolendronate today. I reminded her to stay well hydrated, and to chew TUMS for the extra calcium for the next few days to minimize side effects.   Lauren Buchanan will return in 3 months for a follow up visit with Dr. Jana Hakim and her next dose of zolendronate. Prior to this visit she will have repeat ultrasounds of both breasts as we had done every 3 months in the past. She understands and agrees with this plan. She knows the goal of treatment in her case is control. She has been encouraged to call with any issues that might arise before her next visit here.    Laurie Panda, NP   11/19/2015 11:16 AM

## 2015-11-19 NOTE — Telephone Encounter (Signed)
Appointments made and avs printed for patient °

## 2015-11-20 LAB — CANCER ANTIGEN 27-29 (PARALLEL TESTING): CA 27.29: 10 U/mL (ref 0–39)

## 2015-11-20 LAB — CANCER ANTIGEN 27.29: CA 27.29: 22.4 U/mL (ref 0.0–38.6)

## 2015-11-22 ENCOUNTER — Encounter: Payer: Self-pay | Admitting: Internal Medicine

## 2015-12-13 ENCOUNTER — Ambulatory Visit: Payer: Medicare Other | Admitting: Pulmonary Disease

## 2015-12-17 DIAGNOSIS — I480 Paroxysmal atrial fibrillation: Secondary | ICD-10-CM

## 2015-12-17 HISTORY — DX: Paroxysmal atrial fibrillation: I48.0

## 2015-12-18 ENCOUNTER — Encounter: Payer: Self-pay | Admitting: *Deleted

## 2015-12-23 ENCOUNTER — Encounter: Payer: Self-pay | Admitting: Internal Medicine

## 2015-12-23 DIAGNOSIS — I441 Atrioventricular block, second degree: Secondary | ICD-10-CM | POA: Diagnosis not present

## 2015-12-23 DIAGNOSIS — R001 Bradycardia, unspecified: Secondary | ICD-10-CM | POA: Diagnosis not present

## 2016-01-13 ENCOUNTER — Other Ambulatory Visit: Payer: Self-pay | Admitting: Oncology

## 2016-01-13 ENCOUNTER — Telehealth: Payer: Self-pay | Admitting: Oncology

## 2016-01-13 NOTE — Telephone Encounter (Signed)
returned call and lvm for pt to call back to r/s °

## 2016-01-22 ENCOUNTER — Telehealth: Payer: Self-pay

## 2016-01-22 ENCOUNTER — Other Ambulatory Visit: Payer: Self-pay

## 2016-01-22 NOTE — Telephone Encounter (Signed)
Patient's daughter called because she's worried about her Mom.  She states that she has stopped most of her activities including playing cards.  Her memory has failed noticeably in the last several months.  Patient has no motivation to do anything.  [name] wonders if Dr. Jana Hakim would consider placing patient on an anti depressant.  [name] is willing to wait until her Mom's next appt to discuss.

## 2016-02-05 ENCOUNTER — Ambulatory Visit
Admission: RE | Admit: 2016-02-05 | Discharge: 2016-02-05 | Disposition: A | Discharge status: Home / Self Care | Payer: Medicare Other | Source: Ambulatory Visit | Attending: Nurse Practitioner | Admitting: Nurse Practitioner

## 2016-02-05 ENCOUNTER — Other Ambulatory Visit: Payer: Self-pay | Admitting: Nurse Practitioner

## 2016-02-05 DIAGNOSIS — C50911 Malignant neoplasm of unspecified site of right female breast: Secondary | ICD-10-CM | POA: Diagnosis not present

## 2016-02-05 DIAGNOSIS — C50412 Malignant neoplasm of upper-outer quadrant of left female breast: Secondary | ICD-10-CM

## 2016-02-05 DIAGNOSIS — N63 Unspecified lump in breast: Secondary | ICD-10-CM | POA: Diagnosis not present

## 2016-02-05 DIAGNOSIS — C50912 Malignant neoplasm of unspecified site of left female breast: Secondary | ICD-10-CM | POA: Diagnosis not present

## 2016-02-12 ENCOUNTER — Ambulatory Visit: Payer: Medicare Other | Admitting: Oncology

## 2016-02-12 ENCOUNTER — Other Ambulatory Visit: Payer: Medicare Other

## 2016-02-12 ENCOUNTER — Ambulatory Visit: Payer: Medicare Other

## 2016-02-20 ENCOUNTER — Ambulatory Visit
Admission: RE | Admit: 2016-02-20 | Discharge: 2016-02-20 | Disposition: A | Discharge status: Home / Self Care | Payer: Medicare Other | Source: Ambulatory Visit | Attending: Nurse Practitioner | Admitting: Nurse Practitioner

## 2016-02-20 DIAGNOSIS — N63 Unspecified lump in breast: Secondary | ICD-10-CM | POA: Diagnosis not present

## 2016-02-20 DIAGNOSIS — C50412 Malignant neoplasm of upper-outer quadrant of left female breast: Secondary | ICD-10-CM

## 2016-02-24 ENCOUNTER — Telehealth: Payer: Self-pay | Admitting: Oncology

## 2016-02-24 ENCOUNTER — Ambulatory Visit (HOSPITAL_BASED_OUTPATIENT_CLINIC_OR_DEPARTMENT_OTHER): Payer: Medicare Other | Admitting: Oncology

## 2016-02-24 ENCOUNTER — Ambulatory Visit: Payer: Medicare Other

## 2016-02-24 ENCOUNTER — Other Ambulatory Visit (HOSPITAL_BASED_OUTPATIENT_CLINIC_OR_DEPARTMENT_OTHER): Payer: Medicare Other

## 2016-02-24 VITALS — BP 134/54 | HR 83 | Temp 97.9°F | Resp 17 | Ht 60.0 in | Wt 125.3 lb

## 2016-02-24 DIAGNOSIS — C7952 Secondary malignant neoplasm of bone marrow: Secondary | ICD-10-CM

## 2016-02-24 DIAGNOSIS — C773 Secondary and unspecified malignant neoplasm of axilla and upper limb lymph nodes: Secondary | ICD-10-CM | POA: Diagnosis not present

## 2016-02-24 DIAGNOSIS — C792 Secondary malignant neoplasm of skin: Secondary | ICD-10-CM | POA: Diagnosis not present

## 2016-02-24 DIAGNOSIS — M859 Disorder of bone density and structure, unspecified: Secondary | ICD-10-CM | POA: Diagnosis not present

## 2016-02-24 DIAGNOSIS — C7951 Secondary malignant neoplasm of bone: Secondary | ICD-10-CM | POA: Diagnosis not present

## 2016-02-24 DIAGNOSIS — C50412 Malignant neoplasm of upper-outer quadrant of left female breast: Secondary | ICD-10-CM | POA: Diagnosis not present

## 2016-02-24 LAB — COMPREHENSIVE METABOLIC PANEL
ALT: 29 U/L (ref 0–55)
AST: 28 U/L (ref 5–34)
Albumin: 3.8 g/dL (ref 3.5–5.0)
Alkaline Phosphatase: 86 U/L (ref 40–150)
Anion Gap: 9 mEq/L (ref 3–11)
BUN: 18.1 mg/dL (ref 7.0–26.0)
CHLORIDE: 105 meq/L (ref 98–109)
CO2: 26 meq/L (ref 22–29)
CREATININE: 0.9 mg/dL (ref 0.6–1.1)
Calcium: 10.1 mg/dL (ref 8.4–10.4)
EGFR: 55 mL/min/{1.73_m2} — ABNORMAL LOW (ref >90)
GLUCOSE: 107 mg/dL (ref 70–140)
Potassium: 4.6 mEq/L (ref 3.5–5.1)
Sodium: 139 mEq/L (ref 136–145)
TOTAL PROTEIN: 7.1 g/dL (ref 6.4–8.3)
Total Bilirubin: 0.76 mg/dL (ref 0.20–1.20)

## 2016-02-24 LAB — CBC WITH DIFFERENTIAL/PLATELET
BASO%: 0.3 % (ref 0.0–2.0)
Basophils Absolute: 0 10*3/uL (ref 0.0–0.1)
EOS ABS: 0.1 10*3/uL (ref 0.0–0.5)
EOS%: 0.6 % (ref 0.0–7.0)
HCT: 45.1 % (ref 34.8–46.6)
HGB: 14.6 g/dL (ref 11.6–15.9)
LYMPH%: 9.7 % — AB (ref 14.0–49.7)
MCH: 30 pg (ref 25.1–34.0)
MCHC: 32.4 g/dL (ref 31.5–36.0)
MCV: 92.4 fL (ref 79.5–101.0)
MONO#: 0.6 10*3/uL (ref 0.1–0.9)
MONO%: 7.2 % (ref 0.0–14.0)
NEUT#: 7.1 10*3/uL — ABNORMAL HIGH (ref 1.5–6.5)
NEUT%: 82.2 % — AB (ref 38.4–76.8)
PLATELETS: 245 10*3/uL (ref 145–400)
RBC: 4.88 10*6/uL (ref 3.70–5.45)
RDW: 14 % (ref 11.2–14.5)
WBC: 8.6 10*3/uL (ref 3.9–10.3)
lymph#: 0.8 10*3/uL — ABNORMAL LOW (ref 0.9–3.3)

## 2016-02-24 NOTE — Progress Notes (Signed)
[facility]  [phone] [phone]     ID: Lauren Buchanan DOB: [birth year]  MR#: [id number]  OFP#:[id number]  Patient Care Team: Roderick Pee, MD as PCP - General (Family Medicine) Claud Kelp, MD as Consulting Physician (General Surgery) Lowella Dell, MD as Consulting Physician (Oncology) Amelia Puffer, MD as Consulting Physician (Radiation Oncology) Donnelly Angelica, RN as Registered Nurse Pershing Proud, RN as Registered Nurse PCP: Evette Georges, MD GYN: OTHER MD: Marcelyn Bruins MD, Hillis Range MD, Lurena Nida MD, Venancio Poisson MD  CHIEF COMPLAINT: bilateral estrogen receptor positive breast cancer, metastatic to bone  CURRENT TREATMENT: Anastrozole, zolendronate  NOTE: Patient requests her daughter Lauren Buchanan be contacted regarding all appointments and procedures. [name] can be reached at [phone]   BREAST CANCER HISTORY: From the original intake note:  "Lauren Buchanan"  Has been aware of a sore right nipple for the last 6-8 months. She attributed it to her washing machine, which rubs that area when she uses it. On  11/21/2014 she saw Dr. 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 She mentioned the chest wall problem and Dr. Nicholas Lose obtained to skin biopsies 1 from the right inferior central chest and the other one of from the right mid medial chest.  The second of these showed a nodular basal cell carcinoma. The first one however showed invasive carcinoma consistent with a breast primary. This tumor was estrogen receptor positive at 100%, progesterone receptor positive at 36%, both with strong staining intensity, and HER-2 was amplified , the signals ratio being 6.2. Audree Camel [id number])  On 12/06/2014 the patient underwent bilateral diagnostic mammography with bilateral ultrasonography. Breast density was category B. There was bilateral nipple retraction. In the left breast upper outer quadrant there was a spiculated mass noted  by mammography with a second spiculated mass in the lower inner quadrant. By ultrasound there was a 1.6 cm hypoechoic lesion at the 1:30 o'clock position in the left breast, and a second mass more medially measuring 0.9 cm. Also at the 8:00 position in the left breast 2 cm from the nipple there was a 1.3 cm mass. Ultrasound of the left axilla showed an enlarged lymph node with fatty hilum replacement, measuring 1.4 cm.  In the right breast mammography showed a cluster of indeterminate microcalcifications in the upper outer quadrant and a retroareolar mass, which bile does sound measured 2.9 cm. Ultrasound of the right axilla showed a right axilla lymph node measuring 1.4 cm.  On 12/06/2014 the patient underwent needle core biopsy of 2 separate masses in the left breast (at 1:00 and 8:00) as well as the enlarged axillary lymph node. All 3 were positive for an invasive lobular carcinoma (E-cadherin negative). All 3 tumors were estrogen receptor positive with strong staining intensity (between 87% and 100%). The lymph node was progesterone receptor negative, the other 2 masses being progesterone receptor positive at 9% and at 76%. This cancer was reported to be HER-2 positive at conference today, though I do not have the ratios at hand.  The patient is not an MRI candidate because she has a permanent pacemaker in place. Her subsequent history is as detailed below   INTERVAL HISTORY: Lauren Buchanan  Returns today for follow-up of her metastatic breast cancer accompanied by her daughter. She continues on anastrozole, with good tolerance.  She recently started having some hot flashes, which is unusual for her. She has no other side effects secondary to this medication she obtains it  at a good price.   Since her last visit here she had restaging breast ultrasonography and this shows minimal but measurable progression of her disease on current therapy.  She is here today to discuss those results  REVIEW OF SYSTEMS: Lauren Buchanan   Generally feels fine. She enjoys friends homes. She does not participate in the exercise programs there but she does participate in some of the other programs. She denies pain. She denies unusual headaches, visual changes, cough, phlegm production, pleurisy, or change in bowel or bladder habits. There has been no unexplained fatigue or unexplained weight changes. She denies bleeding, rashes, or fever. A detailed review of systems today was otherwise stable  PAST MEDICAL HISTORY: Past Medical History  Diagnosis Date  . DJD (degenerative joint disease)   . IBS (irritable bowel syndrome)   . Goiter   . Complete heart block ([place]) 1999    s/p PPM by Dr Olevia Perches, most recent generator change 2009  . Renal cyst   . COPD (chronic obstructive pulmonary disease) ([place])   . Headache(784.0)   . Pacemaker   . Wears glasses   . Hiatal hernia   . Esophageal stricture   . Diverticulosis   . Asthma   . Pneumonia   . Giant cell arteritis ([place])   . Cancer of central portion of female breast ([place]) 12/04/2014  . Cancer of central portion of female breast ([place]) 12/04/2014  . Breast cancer ([place]) 12/08/2014    Bilateral  . Chest pain   . GERD (gastroesophageal reflux disease)   . History of general anesthesia complication   . Heart murmur   . Hemorrhoids   . S/P radiation therapy 04/24/15 completed    T-11    PAST SURGICAL HISTORY: Past Surgical History  Procedure Laterality Date  . Pacemaker insertion  1999    Gen change ([facility]) by Dr Olevia Perches 2009  . Cholecystectomy    . Left lower lobectomy for brochiectasis  1950  . Hemorrhoidectomy    . Renal cyst excision    . Tonsillectomy    . Fracture surgery      fx lt wrist  . Colonoscopy    . Artery biopsy Left 01/03/2013    Procedure: BIOPSY LEFT TEMPORAL ARTERY;  Surgeon: Ascencion Dike, MD;  Location: [facility];  Service: ENT;  Laterality: Left;  . Anal fissure repair    . Breast biopsy Right   . Rectal polypectomy    . Mouth surgery       FAMILY HISTORY Family History  Problem Relation Age of Onset  . Heart disease Mother   . Breast cancer Mother   . Stomach cancer Father   . Seizures Son   . Thyroid disease Sister   . Alcohol abuse Father    the patient's father died at the age of 44, with stomach cancer. The patient's mother died at the age of 43 from a myocardial infarction. She had been diagnosed with breast cancer in her 44s. The patient had no brothers, 2 sisters. There is no other history of breast or ovarian cancer in the family to the patient's knowledge  GYNECOLOGIC HISTORY:  No LMP recorded. Patient is postmenopausal. Menarche age 35, first live birth age 70. The patient is GX P4. She underwent menopause in her late 19s. She did not take hormone replacement.  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 She is a widow, lives by herself, with no pets. Daughter Wendy Poet  lives in [place] where she is Mudlogger of a preschool and day care. Son Bellamia Ferch lives in [place] where he owns a furniture to sign store. Daughter Shelly Rubenstein lives in [place]. She owns a Chiropractor. Son Randall Hiss is physically and mentally disabled and lives in a group home in [place]. The patient has 7 grandchildren and 2 great-grandchildren. She attends a local [employer]: In place; the patient's daughter [name] is her healthcare power of attorney. She can be reached at [place]: Social History  Substance Use Topics  . Smoking status: Never Smoker   . Smokeless tobacco: Never Used  . Alcohol Use: Not on file     Comment: Occasional      Colonoscopy:  PAP:  Bone density:02/19/2015, osteoporosis  Lipid panel:  Allergies  Allergen Reactions  . Morphine And Related Nausea Only  . Amoxicillin-Pot Clavulanate Other (See Comments)    Unknown     Current Outpatient Prescriptions  Medication Sig Dispense Refill  . anastrozole (ARIMIDEX)  1 MG tablet Take 1 tablet (1 mg total) by mouth daily. 90 tablet 3  . Calcium Citrate-Vitamin D (CITRACAL PETITES/VITAMIN D) 200-250 MG-UNIT TABS Take 1 tablet by mouth daily.      . metoprolol succinate (TOPROL-XL) 100 MG 24 hr tablet TAKE 1/2 (ONE-HALF) TABLET BY MOUTH EVERY MORNING 30 tablet 4  . naproxen sodium (ANAPROX) 220 MG tablet Take 220 mg by mouth 2 (two) times daily with a meal.     . oxyCODONE-acetaminophen (PERCOCET/ROXICET) 5-325 MG tablet TAKE 1 TABLET BY MOUTH DAILY AS NEEDED FOR SEVERE PAIN 60 tablet 0  . predniSONE (DELTASONE) 5 MG tablet Take 5 mg by mouth daily with breakfast. Reported on 11/19/2015    . Probiotic Product (PROBIOTIC DAILY PO) Take 1 tablet by mouth daily. 1 Chew daily.    . traMADol (ULTRAM) 50 MG tablet Take 50 mg by mouth every 6 (six) hours as needed for moderate pain or severe pain. Reported on 11/19/2015    . TURMERIC PO Take 1 capsule by mouth daily.    . vitamin C (ASCORBIC ACID) 500 MG tablet Take 500 mg by mouth daily.      . [DISCONTINUED] loratadine (CLARITIN) 10 MG tablet Take 10 mg by mouth daily.       No current facility-administered medications for this visit.    OBJECTIVE: Elderly white woman who appears stated age 60 Vitals:   02/24/16 1428  BP: 134/54  Pulse: 83  Temp: 97.9 F (36.6 C)  Resp: 17     Body mass index is 24.47 kg/(m^2).    ECOG FS:1 - Symptomatic but completely ambulatory  Sclerae unicteric, pupils round and equal Oropharynx clear and moist-- no thrush or other lesions No cervical or supraclavicular adenopathy Lungs no rales or rhonchi Heart regular rate and rhythm Abd soft, obese,nontender, positive bowel sounds MSK mild kyphosis butno focal spinal tenderness, no upper extremity lymphedema Neuro: nonfocal, well oriented though slightly forgetful, appropriate affect Breasts: I don't palpate a mass in either breast. Both axillae are benign.    LAB RESULTS:  CMP     Component Value Date/Time   NA 135*  11/19/2015 1022   NA 138 03/07/2015 0925   K 4.2 11/19/2015 1022   K 4.2 03/07/2015 0925   CL 104 03/07/2015 0925   CO2 26 11/19/2015 1022   CO2 28 03/07/2015 0925   GLUCOSE 112 11/19/2015 1022   GLUCOSE 79 03/07/2015  0925   BUN 11.4 11/19/2015 1022   BUN 14 03/07/2015 0925   CREATININE 0.9 11/19/2015 1022   CREATININE 0.56 03/07/2015 0925   CALCIUM 9.2 11/19/2015 1022   CALCIUM 9.0 03/07/2015 0925   PROT 6.9 11/19/2015 1022   PROT 6.9 09/27/2014 1600   ALBUMIN 3.7 11/19/2015 1022   ALBUMIN 3.7 09/27/2014 1600   AST 27 11/19/2015 1022   AST 30 09/27/2014 1600   ALT 23 11/19/2015 1022   ALT 27 09/27/2014 1600   ALKPHOS 87 11/19/2015 1022   ALKPHOS 66 09/27/2014 1600   BILITOT 0.93 11/19/2015 1022   BILITOT 0.5 09/27/2014 1600   GFRNONAA >60 03/07/2015 0925   GFRAA >60 03/07/2015 0925    INo results found for: SPEP, UPEP  Lab Results  Component Value Date   WBC 7.2 11/19/2015   NEUTROABS 5.5 11/19/2015   HGB 13.6 11/19/2015   HCT 41.6 11/19/2015   MCV 91.3 11/19/2015   PLT 266 11/19/2015      Chemistry      Component Value Date/Time   NA 135* 11/19/2015 1022   NA 138 03/07/2015 0925   K 4.2 11/19/2015 1022   K 4.2 03/07/2015 0925   CL 104 03/07/2015 0925   CO2 26 11/19/2015 1022   CO2 28 03/07/2015 0925   BUN 11.4 11/19/2015 1022   BUN 14 03/07/2015 0925   CREATININE 0.9 11/19/2015 1022   CREATININE 0.56 03/07/2015 0925      Component Value Date/Time   CALCIUM 9.2 11/19/2015 1022   CALCIUM 9.0 03/07/2015 0925   ALKPHOS 87 11/19/2015 1022   ALKPHOS 66 09/27/2014 1600   AST 27 11/19/2015 1022   AST 30 09/27/2014 1600   ALT 23 11/19/2015 1022   ALT 27 09/27/2014 1600   BILITOT 0.93 11/19/2015 1022   BILITOT 0.5 09/27/2014 1600       Lab Results  Component Value Date   LABCA2 10 11/19/2015    No components found for: [id number]  No results for input(s): INR in the last 168 hours.  Urinalysis    Component Value Date/Time   COLORURINE YELLOW  09/27/2014 1800   APPEARANCEUR CLEAR 09/27/2014 1800   LABSPEC 1.005 09/27/2014 1800   PHURINE 7.0 09/27/2014 1800   GLUCOSEU NEGATIVE 09/27/2014 1800   HGBUR NEGATIVE 09/27/2014 1800   HGBUR trace-intact 01/17/2010 0932   BILIRUBINUR NEGATIVE 09/27/2014 1800   BILIRUBINUR n 09/01/2011 1157   KETONESUR NEGATIVE 09/27/2014 1800   PROTEINUR NEGATIVE 09/27/2014 1800   PROTEINUR n 09/01/2011 1157   UROBILINOGEN 0.2 09/27/2014 1800   UROBILINOGEN 0.2 09/01/2011 1157   NITRITE NEGATIVE 09/27/2014 1800   NITRITE n 09/01/2011 1157   LEUKOCYTESUR TRACE* 09/27/2014 1800    STUDIES: US Breast Ltd Uni Left Inc Axilla  02/20/2016  CLINICAL DATA:  Patient returns today for diagnostic ultrasound characterization of patient's known biopsy proven left breast cancers and known subareolar right breast mass. EXAM: ULTRASOUND OF THE BILATERAL BREAST COMPARISON:  Previous exam(s). FINDINGS: Targeted ultrasound again shows the irregular subareolar RIGHT breast mass, with internal vascularity, measuring 1.9 x 1.5 x 1.6 cm, slightly increased in size compared to the previous ultrasound of 09/23/2015 where it measured 1.4 x 0.9 x 1.1 cm. On the earlier ultrasound of 06/05/2015, the mass was measured at 2.2 x 1.2 x 1.8 cm. Targeted ultrasound was then performed of the LEFT breast again showing the irregular hypoechoic mass in the left breast at the 1:30 o'clock axis, 4 cm from the nipple, measuring 1.3 x  0.8 x 1.2 cm, consistent with 1 of patient's known biopsy-proven left breast cancers, not significantly changed compared to the previous exam. There is, however, a NEW small satellite nodule within the LEFT breast at the 1:30 o'clock axis, just beneath the skin, with internal vascularity, measuring 0.4 x 0.3 x 0.4 cm, corresponding to the new density seen on patient's earlier diagnostic mammogram, suggesting progression of disease. Next, ultrasound evaluation was performed of the additional biopsy-proven LEFT breast cancer  cancer which is located at the 8 o'clock axis, 2 cm from the nipple, measuring 0.7 x 0.5 x 0.6 cm, not significantly changed in size compared to the previous exam. IMPRESSION: 1. Slight interval enlargement of the irregular mass within the subareolar RIGHT breast when compared to the most recent ultrasound dated 09/23/2015, now measuring 1.9 x 1.5 x 1.6 cm versus previous measurement of 1.4 x 0.9 x 1.1 cm. This mass is irregular and difficult to measure in the same planes from exam to exam. I suspect the difference in measurements may be attributable to differences in measurement technique. Today's measurements remain smaller than measurements on ultrasound of 06/05/2015 (measured 2.2 x 1.2 x 1.8 cm at that time). 2. Biopsy-proven cancer within the LEFT breast at the 1:30 o'clock axis, 4 cm from the nipple, currently measuring 1.3 x 0.8 x 1.2 cm, not significantly changed compared to the previous exam of 06/05/2015. 3. NEW small satellite nodule within the upper LEFT breast, superficial in position, measuring 0.4 x 0.3 x 0.4 cm, located immediately adjacent to the dominant left breast mass at the 1:30 o'clock axis, corresponding to the new density seen on earlier diagnostic mammogram of 02/05/2016. This suggests local progression of disease. 4. Additional biopsy-proven cancer within the LEFT breast at the 8 o'clock axis, 2 cm from the nipple, measuring 0.7 x 0.5 x 0.6 cm, not significantly changed. RECOMMENDATION: Per current treatment plan for patient's known left breast cancers and known right breast mass. I have discussed the findings and recommendations with the patient. Results were also provided in writing at the conclusion of the visit. If applicable, a reminder letter will be sent to the patient regarding the next appointment. BI-RADS CATEGORY  6: Known biopsy-proven malignancy - appropriate action should be taken. Electronically Signed   By: Franki Cabot M.D.   On: 02/20/2016 17:10   US Breast Ltd Uni  Right Inc Axilla  02/20/2016  CLINICAL DATA:  Patient returns today for diagnostic ultrasound characterization of patient's known biopsy proven left breast cancers and known subareolar right breast mass. EXAM: ULTRASOUND OF THE BILATERAL BREAST COMPARISON:  Previous exam(s). FINDINGS: Targeted ultrasound again shows the irregular subareolar RIGHT breast mass, with internal vascularity, measuring 1.9 x 1.5 x 1.6 cm, slightly increased in size compared to the previous ultrasound of 09/23/2015 where it measured 1.4 x 0.9 x 1.1 cm. On the earlier ultrasound of 06/05/2015, the mass was measured at 2.2 x 1.2 x 1.8 cm. Targeted ultrasound was then performed of the LEFT breast again showing the irregular hypoechoic mass in the left breast at the 1:30 o'clock axis, 4 cm from the nipple, measuring 1.3 x 0.8 x 1.2 cm, consistent with 1 of patient's known biopsy-proven left breast cancers, not significantly changed compared to the previous exam. There is, however, a NEW small satellite nodule within the LEFT breast at the 1:30 o'clock axis, just beneath the skin, with internal vascularity, measuring 0.4 x 0.3 x 0.4 cm, corresponding to the new density seen on patient's earlier diagnostic mammogram, suggesting  progression of disease. Next, ultrasound evaluation was performed of the additional biopsy-proven LEFT breast cancer cancer which is located at the 8 o'clock axis, 2 cm from the nipple, measuring 0.7 x 0.5 x 0.6 cm, not significantly changed in size compared to the previous exam. IMPRESSION: 1. Slight interval enlargement of the irregular mass within the subareolar RIGHT breast when compared to the most recent ultrasound dated 09/23/2015, now measuring 1.9 x 1.5 x 1.6 cm versus previous measurement of 1.4 x 0.9 x 1.1 cm. This mass is irregular and difficult to measure in the same planes from exam to exam. I suspect the difference in measurements may be attributable to differences in measurement technique. Today's  measurements remain smaller than measurements on ultrasound of 06/05/2015 (measured 2.2 x 1.2 x 1.8 cm at that time). 2. Biopsy-proven cancer within the LEFT breast at the 1:30 o'clock axis, 4 cm from the nipple, currently measuring 1.3 x 0.8 x 1.2 cm, not significantly changed compared to the previous exam of 06/05/2015. 3. NEW small satellite nodule within the upper LEFT breast, superficial in position, measuring 0.4 x 0.3 x 0.4 cm, located immediately adjacent to the dominant left breast mass at the 1:30 o'clock axis, corresponding to the new density seen on earlier diagnostic mammogram of 02/05/2016. This suggests local progression of disease. 4. Additional biopsy-proven cancer within the LEFT breast at the 8 o'clock axis, 2 cm from the nipple, measuring 0.7 x 0.5 x 0.6 cm, not significantly changed. RECOMMENDATION: Per current treatment plan for patient's known left breast cancers and known right breast mass. I have discussed the findings and recommendations with the patient. Results were also provided in writing at the conclusion of the visit. If applicable, a reminder letter will be sent to the patient regarding the next appointment. BI-RADS CATEGORY  6: Known biopsy-proven malignancy - appropriate action should be taken. Electronically Signed   By: Franki Cabot M.D.   On: 02/20/2016 17:10   Mm Diag Breast Tomo Bilateral  02/05/2016  CLINICAL DATA:  Patient with biopsy-proven left breast cancers, with osseous metastases, being treated with hormonal therapy. Patient also with biopsy-proven axillary lymph node metastasis. Known subareolar right breast cancer. Skin punch biopsy of the right breast performed on 11/21/2014 showed invasive carcinoma. EXAM: 2D DIGITAL DIAGNOSTIC BILATERAL MAMMOGRAM WITH CAD AND ADJUNCT TOMO COMPARISON:  Previous exams including right breast diagnostic mammogram dated 09/23/2015 and bilateral mammograms dated 12/06/2014. ACR Breast Density Category b: There are scattered areas of  fibroglandular density. FINDINGS: The spiculated mass with associated architectural distortion located in the upper-outer quadrant of the LEFT breast, containing a coil shaped clip, consistent with patient's known biopsy-proven carcinoma, appears stable. There is a questionable new small satellite nodule within the upper-outer quadrant adjacent to this dominant left breast mass, measuring 4 mm greatest dimension. No other appreciable change within the left breast. The additional smaller irregular mass within the central LEFT breast, slightly inner, with adjacent ribbon shaped clip, consistent with patient's additional known biopsy-proven carcinoma, also appears stable. There is no mammographic change in the appearance of the subareolar right breast, site of patient's known subareolar mass. The grouped indeterminate calcifications within the upper right breast, measuring 6 mm extent, also appears stable. Enlarged lymph nodes within the right axilla appear stable. No new dominant masses, suspicious calcifications or secondary signs of malignancy are identified within the right breast. Mammographic images were processed with CAD. IMPRESSION: 1. Known biopsy-proven left breast cancers. No significant interval change in the appearance of these known biopsy-proven  cancers. Questionable new small (4 mm) satellite nodule within the upper-outer quadrant adjacent to the dominant left breast mass. 2. Stable appearance of the right breast, as detailed above. The enlarged lymph nodes within the right axilla appear stable. Per clinic note dated 11/19/2015, ultrasound was desired to ensure stability of the subareolar right breast mass. Patient was contacted and will return on May 4th at 2:30 p.m. for this ultrasound evaluation. Ultrasound of the left breast will also be performed to evaluate the possible small satellite nodule adjacent to the dominant mass in the outer left breast. RECOMMENDATION: Per current treatment plan. As  above, clinic note from 11/19/2015 indicates that bilateral breast ultrasounds are desired, mainly to ensure stability of the subareolar right breast mass compared to previous ultrasound evaluations. Patient was contacted and will return on May 4th at 2:30 p.m. for this ultrasound evaluation. Ultrasound will also be performed on the possible small satellite nodule adjacent to the dominant mass in the outer left breast for a more complete characterization. I have discussed the findings and recommendations with the patient. Results were also provided in writing at the conclusion of the visit. If applicable, a reminder letter will be sent to the patient regarding the next appointment. BI-RADS CATEGORY  6: Known biopsy-proven malignancy. Electronically Signed   By: Franki Cabot M.D.   On: 02/05/2016 16:37    ASSESSMENT: 82 y.o. [place] woman with bilateral breast cancer, as follows:  (1) status post right chest skin biopsy 11/21/2014 for a clinical T4 N1, stage IIIB invasive ductal carcinoma, estrogen and progesterone receptor positive, HER-2 amplified  (2) status post 2 separate left breast biopsies and one left axillary lymph node biopsy 12/04/2014, all positive for a clinical mT1c N1, stage IIA invasive lobular carcinoma, estrogen receptor positive, progesterone receptor variable, with MIB-1 between 10 and 40%, and no HER-2 amplification  (3) neoadjuvant anastrozole started 12/14/2014  (4) definitive surgery to consist of bilateral mastectomies without formal axillary dissection: Postponed  (5) PET scan 12/19/2014 shows in addition to the bilateral breast and bilateral axillary masses, a lesion at the T11 vertebral body, showing an irregular lucency on prior CT exam  (a) adjuvant radiationt to T10-T12: 04/11/2015 through 04/24/2015 Site/dose: The patient was treated to the T10-T12 vertebral bodies to a total dose of 30 gray in 10 fractions at 3 gray per fraction. This was accomplished using a 2  field, 3-D conformal technique.  (6) consider anti-HER-2 treatment depending on response to antiestrogen therapy alone. On hold.   (7) osteoporosis with T score of -2.6 on bone scan obtained 02/19/2015.  (a) zolendronate started 03/20/2015, repeated every 12 weeks he will lab  PLAN: Lauren Buchanan looks terrific and she is tolerating her treatments well.however the tumor is beginning to get around the estrogen blockade. We're going to have to change.  Today we discussed fulvestrant, fulvestrant plus palbociclib, or anti-HER-2 immunotherapy. All these options, the fulvestrant alone is the simplest, and we are going to start with that. She has a good understanding of the possible toxicities, side effects and complications. She will receive her first dose next week, then a booster dose 3 weeks from now, and then her first monthly dose 5 weeks from now. We will see her at that point.  If she is tolerating this well, we will consider adding palbociclib at the lowest dose, namely 75 mg every other day, beginning with her July fulvestrant dose. Hopefully that will gain [place] another couple of years or longer in terms of tumor control.  We will repeat an ultrasound of the breast and axilla in 3 months  She will receive zolendronate today and again 12 weeks from now. She tolerates this with no side effects.  Overall I'm very pleased with the way Lauren Buchanan has been ding and the goal continues to be to control her cancer using the minimum necessary interventions. She knows to call for any problems that may develop before her next visit here   Chauncey Cruel, MD   02/24/2016 3:02 PM

## 2016-02-24 NOTE — Progress Notes (Signed)
Patient here with daughter.  They just finished seeing Dr. Jana Hakim.  They did not realize she would be getting zometa today.  They have been to lab.  Patient is in chair.  She states she really doesn't feel good and really does not want the zometa today.  Dtr. Asked if she could get it next week when she comes for her injections.  Let her know that this is fine and we will send POF to get zometa after injections on the 17th of May.  Patient and dtr.very appreciative.  Patient left ambulatory accompanied by her dtr.

## 2016-02-24 NOTE — Telephone Encounter (Signed)
appt made and avs printed °

## 2016-02-25 LAB — CANCER ANTIGEN 27-29 (PARALLEL TESTING): CA 27.29: 16 U/mL (ref <38)

## 2016-02-25 LAB — CANCER ANTIGEN 27.29: CA 27.29: 13.8 U/mL (ref 0.0–38.6)

## 2016-03-03 ENCOUNTER — Telehealth: Payer: Self-pay | Admitting: *Deleted

## 2016-03-03 NOTE — Telephone Encounter (Signed)
Per staff message and POF I have scheduled appts. Advised scheduler of appts. JMW  

## 2016-03-03 NOTE — Telephone Encounter (Signed)
This RN received transferred call from scheduling - from daughter requesting pt's infusion appointment to be rescheduled - they do want to keep the injection appointment- appointments are scheduled for 5/17.  POF sent to scheduling per above.

## 2016-03-04 ENCOUNTER — Ambulatory Visit: Payer: Medicare Other

## 2016-03-04 ENCOUNTER — Ambulatory Visit (HOSPITAL_BASED_OUTPATIENT_CLINIC_OR_DEPARTMENT_OTHER): Payer: Medicare Other

## 2016-03-04 VITALS — BP 141/62 | HR 81 | Temp 97.6°F

## 2016-03-04 DIAGNOSIS — Z5111 Encounter for antineoplastic chemotherapy: Secondary | ICD-10-CM

## 2016-03-04 DIAGNOSIS — C7951 Secondary malignant neoplasm of bone: Secondary | ICD-10-CM | POA: Diagnosis not present

## 2016-03-04 DIAGNOSIS — C773 Secondary and unspecified malignant neoplasm of axilla and upper limb lymph nodes: Secondary | ICD-10-CM

## 2016-03-04 DIAGNOSIS — C50412 Malignant neoplasm of upper-outer quadrant of left female breast: Secondary | ICD-10-CM | POA: Diagnosis not present

## 2016-03-04 MED ORDER — FULVESTRANT 250 MG/5ML IM SOLN
500.0000 mg | Freq: Once | INTRAMUSCULAR | Status: AC
Start: 2016-03-04 — End: 2016-03-04
  Administered 2016-03-04: 500 mg via INTRAMUSCULAR
  Filled 2016-03-04: qty 10

## 2016-03-18 ENCOUNTER — Ambulatory Visit: Payer: Medicare Other

## 2016-03-18 ENCOUNTER — Ambulatory Visit (HOSPITAL_BASED_OUTPATIENT_CLINIC_OR_DEPARTMENT_OTHER): Payer: Medicare Other

## 2016-03-18 VITALS — BP 142/46 | HR 70 | Temp 97.7°F | Resp 18

## 2016-03-18 DIAGNOSIS — C773 Secondary and unspecified malignant neoplasm of axilla and upper limb lymph nodes: Secondary | ICD-10-CM | POA: Diagnosis not present

## 2016-03-18 DIAGNOSIS — C50412 Malignant neoplasm of upper-outer quadrant of left female breast: Secondary | ICD-10-CM | POA: Diagnosis not present

## 2016-03-18 DIAGNOSIS — C7951 Secondary malignant neoplasm of bone: Secondary | ICD-10-CM

## 2016-03-18 DIAGNOSIS — Z5111 Encounter for antineoplastic chemotherapy: Secondary | ICD-10-CM

## 2016-03-18 MED ORDER — FULVESTRANT 250 MG/5ML IM SOLN
500.0000 mg | Freq: Once | INTRAMUSCULAR | Status: AC
  Administered 2016-03-18: 500 mg via INTRAMUSCULAR
  Filled 2016-03-18: qty 10

## 2016-03-18 MED ORDER — ZOLEDRONIC ACID 4 MG/5ML IV CONC
3.3000 mg | Freq: Once | INTRAVENOUS | Status: AC
  Administered 2016-03-18: 3.3 mg via INTRAVENOUS
  Filled 2016-03-18: qty 4.13

## 2016-03-18 MED ORDER — SODIUM CHLORIDE 0.9 % IV SOLN
Freq: Once | INTRAVENOUS | Status: AC
  Administered 2016-03-18: 14:00:00 via INTRAVENOUS

## 2016-03-18 NOTE — Progress Notes (Signed)
Injection to be given in infusion area

## 2016-03-18 NOTE — Patient Instructions (Signed)
Fulvestrant injection What is this medicine? FULVESTRANT (ful VES trant) blocks the effects of estrogen. It is used to treat breast cancer. This medicine may be used for other purposes; ask your health care provider or pharmacist if you have questions. What should I tell my health care provider before I take this medicine? They need to know if you have any of these conditions: -bleeding problems -liver disease -low levels of platelets in the blood -an unusual or allergic reaction to fulvestrant, other medicines, foods, dyes, or preservatives -pregnant or trying to get pregnant -breast-feeding How should I use this medicine? This medicine is for injection into a muscle. It is usually given by a health care professional in a hospital or clinic setting. Talk to your pediatrician regarding the use of this medicine in children. Special care may be needed. Overdosage: If you think you have taken too much of this medicine contact a poison control center or emergency room at once. NOTE: This medicine is only for you. Do not share this medicine with others. What if I miss a dose? It is important not to miss your dose. Call your doctor or health care professional if you are unable to keep an appointment. What may interact with this medicine? -medicines that treat or prevent blood clots like warfarin, enoxaparin, and dalteparin This list may not describe all possible interactions. Give your health care provider a list of all the medicines, herbs, non-prescription drugs, or dietary supplements you use. Also tell them if you smoke, drink alcohol, or use illegal drugs. Some items may interact with your medicine. What should I watch for while using this medicine? Your condition will be monitored carefully while you are receiving this medicine. You will need important blood work done while you are taking this medicine. Do not become pregnant while taking this medicine or for at least 1 year after stopping  it. Women of child-bearing potential will need to have a negative pregnancy test before starting this medicine. Women should inform their doctor if they wish to become pregnant or think they might be pregnant. There is a potential for serious side effects to an unborn child. Men should inform their doctors if they wish to father a child. This medicine may lower sperm counts. Talk to your health care professional or pharmacist for more information. Do not breast-feed an infant while taking this medicine or for 1 year after the last dose. What side effects may I notice from receiving this medicine? Side effects that you should report to your doctor or health care professional as soon as possible: -allergic reactions like skin rash, itching or hives, swelling of the face, lips, or tongue -feeling faint or lightheaded, falls -pain, tingling, numbness, or weakness in the legs -signs and symptoms of infection like fever or chills; cough; flu-like symptoms; sore throat -vaginal bleeding Side effects that usually do not require medical attention (report to your doctor or health care professional if they continue or are bothersome): -aches, pains -constipation -diarrhea -headache -hot flashes -nausea, vomiting -pain at site where injected -stomach pain This list may not describe all possible side effects. Call your doctor for medical advice about side effects. You may report side effects to FDA at 1-800-FDA-1088. Where should I keep my medicine? This drug is given in a hospital or clinic and will not be stored at home. NOTE: This sheet is a summary. It may not cover all possible information. If you have questions about this medicine, talk to your doctor, pharmacist, or health  care provider.    2016, [employer]. (2015-05-03 11:03:55)    Zoledronic Acid injection (Hypercalcemia, Oncology) What is this medicine? ZOLEDRONIC ACID (ZOE le dron ik AS id) lowers the amount of calcium loss  from bone. It is used to treat too much calcium in your blood from cancer. It is also used to prevent complications of cancer that has spread to the bone. This medicine may be used for other purposes; ask your health care provider or pharmacist if you have questions. What should I tell my health care provider before I take this medicine? They need to know if you have any of these conditions: -aspirin-sensitive asthma -cancer, especially if you are receiving medicines used to treat cancer -dental disease or wear dentures -infection -kidney disease -receiving corticosteroids like dexamethasone or prednisone -an unusual or allergic reaction to zoledronic acid, other medicines, foods, dyes, or preservatives -pregnant or trying to get pregnant -breast-feeding How should I use this medicine? This medicine is for infusion into a vein. It is given by a health care professional in a hospital or clinic setting. Talk to your pediatrician regarding the use of this medicine in children. Special care may be needed. Overdosage: If you think you have taken too much of this medicine contact a poison control center or emergency room at once. NOTE: This medicine is only for you. Do not share this medicine with others. What if I miss a dose? It is important not to miss your dose. Call your doctor or health care professional if you are unable to keep an appointment. What may interact with this medicine? -certain antibiotics given by injection -NSAIDs, medicines for pain and inflammation, like ibuprofen or naproxen -some diuretics like bumetanide, furosemide -teriparatide -thalidomide This list may not describe all possible interactions. Give your health care provider a list of all the medicines, herbs, non-prescription drugs, or dietary supplements you use. Also tell them if you smoke, drink alcohol, or use illegal drugs. Some items may interact with your medicine. What should I watch for while using this  medicine? Visit your doctor or health care professional for regular checkups. It may be some time before you see the benefit from this medicine. Do not stop taking your medicine unless your doctor tells you to. Your doctor may order blood tests or other tests to see how you are doing. Women should inform their doctor if they wish to become pregnant or think they might be pregnant. There is a potential for serious side effects to an unborn child. Talk to your health care professional or pharmacist for more information. You should make sure that you get enough calcium and vitamin D while you are taking this medicine. Discuss the foods you eat and the vitamins you take with your health care professional. Some people who take this medicine have severe bone, joint, and/or muscle pain. This medicine may also increase your risk for jaw problems or a broken thigh bone. Tell your doctor right away if you have severe pain in your jaw, bones, joints, or muscles. Tell your doctor if you have any pain that does not go away or that gets worse. Tell your dentist and dental surgeon that you are taking this medicine. You should not have major dental surgery while on this medicine. See your dentist to have a dental exam and fix any dental problems before starting this medicine. Take good care of your teeth while on this medicine. Make sure you see your dentist for regular follow-up appointments. What side effects  may I notice from receiving this medicine? Side effects that you should report to your doctor or health care professional as soon as possible: -allergic reactions like skin rash, itching or hives, swelling of the face, lips, or tongue -anxiety, confusion, or depression -breathing problems -changes in vision -eye pain -feeling faint or lightheaded, falls -jaw pain, especially after dental work -mouth sores -muscle cramps, stiffness, or weakness -redness, blistering, peeling or loosening of the skin, including  inside the mouth -trouble passing urine or change in the amount of urine Side effects that usually do not require medical attention (report to your doctor or health care professional if they continue or are bothersome): -bone, joint, or muscle pain -constipation -diarrhea -fever -hair loss -irritation at site where injected -loss of appetite -nausea, vomiting -stomach upset -trouble sleeping -trouble swallowing -weak or tired This list may not describe all possible side effects. Call your doctor for medical advice about side effects. You may report side effects to FDA at 1-800-FDA-1088. Where should I keep my medicine? This drug is given in a hospital or clinic and will not be stored at home. NOTE: This sheet is a summary. It may not cover all possible information. If you have questions about this medicine, talk to your doctor, pharmacist, or health care provider.    2016, [employer]. (2014-03-03 14:19:39)

## 2016-03-23 ENCOUNTER — Encounter: Payer: Self-pay | Admitting: Internal Medicine

## 2016-03-23 DIAGNOSIS — I441 Atrioventricular block, second degree: Secondary | ICD-10-CM | POA: Diagnosis not present

## 2016-03-23 DIAGNOSIS — R001 Bradycardia, unspecified: Secondary | ICD-10-CM | POA: Diagnosis not present

## 2016-04-01 ENCOUNTER — Telehealth: Payer: Self-pay | Admitting: Oncology

## 2016-04-01 ENCOUNTER — Ambulatory Visit (HOSPITAL_BASED_OUTPATIENT_CLINIC_OR_DEPARTMENT_OTHER): Payer: Medicare Other

## 2016-04-01 ENCOUNTER — Encounter: Payer: Self-pay | Admitting: Nurse Practitioner

## 2016-04-01 ENCOUNTER — Ambulatory Visit (HOSPITAL_BASED_OUTPATIENT_CLINIC_OR_DEPARTMENT_OTHER): Payer: Medicare Other | Admitting: Nurse Practitioner

## 2016-04-01 ENCOUNTER — Other Ambulatory Visit (HOSPITAL_BASED_OUTPATIENT_CLINIC_OR_DEPARTMENT_OTHER): Payer: Medicare Other

## 2016-04-01 VITALS — BP 145/65 | HR 87 | Temp 98.7°F | Resp 18 | Ht 60.0 in | Wt 124.9 lb

## 2016-04-01 DIAGNOSIS — C7951 Secondary malignant neoplasm of bone: Secondary | ICD-10-CM

## 2016-04-01 DIAGNOSIS — Z5111 Encounter for antineoplastic chemotherapy: Secondary | ICD-10-CM

## 2016-04-01 DIAGNOSIS — C7952 Secondary malignant neoplasm of bone marrow: Secondary | ICD-10-CM

## 2016-04-01 DIAGNOSIS — C50412 Malignant neoplasm of upper-outer quadrant of left female breast: Secondary | ICD-10-CM

## 2016-04-01 DIAGNOSIS — C50912 Malignant neoplasm of unspecified site of left female breast: Secondary | ICD-10-CM

## 2016-04-01 LAB — CBC WITH DIFFERENTIAL/PLATELET
BASO%: 0.2 % (ref 0.0–2.0)
Basophils Absolute: 0 10*3/uL (ref 0.0–0.1)
EOS%: 2.3 % (ref 0.0–7.0)
Eosinophils Absolute: 0.2 10*3/uL (ref 0.0–0.5)
HEMATOCRIT: 41.5 % (ref 34.8–46.6)
HGB: 13.6 g/dL (ref 11.6–15.9)
LYMPH#: 1.1 10*3/uL (ref 0.9–3.3)
LYMPH%: 16.5 % (ref 14.0–49.7)
MCH: 30.3 pg (ref 25.1–34.0)
MCHC: 32.8 g/dL (ref 31.5–36.0)
MCV: 92.4 fL (ref 79.5–101.0)
MONO#: 0.8 10*3/uL (ref 0.1–0.9)
MONO%: 12 % (ref 0.0–14.0)
NEUT%: 69 % (ref 38.4–76.8)
NEUTROS ABS: 4.6 10*3/uL (ref 1.5–6.5)
Platelets: 191 10*3/uL (ref 145–400)
RBC: 4.49 10*6/uL (ref 3.70–5.45)
RDW: 13.6 % (ref 11.2–14.5)
WBC: 6.6 10*3/uL (ref 3.9–10.3)

## 2016-04-01 LAB — COMPREHENSIVE METABOLIC PANEL
ALT: 28 U/L (ref 0–55)
AST: 32 U/L (ref 5–34)
Albumin: 3.5 g/dL (ref 3.5–5.0)
Alkaline Phosphatase: 90 U/L (ref 40–150)
Anion Gap: 9 mEq/L (ref 3–11)
BUN: 16 mg/dL (ref 7.0–26.0)
CHLORIDE: 105 meq/L (ref 98–109)
CO2: 26 meq/L (ref 22–29)
CREATININE: 0.9 mg/dL (ref 0.6–1.1)
Calcium: 9.7 mg/dL (ref 8.4–10.4)
EGFR: 61 mL/min/{1.73_m2} — ABNORMAL LOW (ref >90)
GLUCOSE: 114 mg/dL (ref 70–140)
Potassium: 3.7 mEq/L (ref 3.5–5.1)
Sodium: 139 mEq/L (ref 136–145)
Total Bilirubin: 1.19 mg/dL (ref 0.20–1.20)
Total Protein: 6.9 g/dL (ref 6.4–8.3)

## 2016-04-01 MED ORDER — FULVESTRANT 250 MG/5ML IM SOLN
500.0000 mg | Freq: Once | INTRAMUSCULAR | Status: AC
  Administered 2016-04-01: 500 mg via INTRAMUSCULAR
  Filled 2016-04-01: qty 10

## 2016-04-01 NOTE — Patient Instructions (Signed)
Fulvestrant injection What is this medicine? FULVESTRANT (ful VES trant) blocks the effects of estrogen. It is used to treat breast cancer. This medicine may be used for other purposes; ask your health care provider or pharmacist if you have questions. What should I tell my health care provider before I take this medicine? They need to know if you have any of these conditions: -bleeding problems -liver disease -low levels of platelets in the blood -an unusual or allergic reaction to fulvestrant, other medicines, foods, dyes, or preservatives -pregnant or trying to get pregnant -breast-feeding How should I use this medicine? This medicine is for injection into a muscle. It is usually given by a health care professional in a hospital or clinic setting. Talk to your pediatrician regarding the use of this medicine in children. Special care may be needed. Overdosage: If you think you have taken too much of this medicine contact a poison control center or emergency room at once. NOTE: This medicine is only for you. Do not share this medicine with others. What if I miss a dose? It is important not to miss your dose. Call your doctor or health care professional if you are unable to keep an appointment. What may interact with this medicine? -medicines that treat or prevent blood clots like warfarin, enoxaparin, and dalteparin This list may not describe all possible interactions. Give your health care provider a list of all the medicines, herbs, non-prescription drugs, or dietary supplements you use. Also tell them if you smoke, drink alcohol, or use illegal drugs. Some items may interact with your medicine. What should I watch for while using this medicine? Your condition will be monitored carefully while you are receiving this medicine. You will need important blood work done while you are taking this medicine. Do not become pregnant while taking this medicine or for at least 1 year after stopping  it. Women of child-bearing potential will need to have a negative pregnancy test before starting this medicine. Women should inform their doctor if they wish to become pregnant or think they might be pregnant. There is a potential for serious side effects to an unborn child. Men should inform their doctors if they wish to father a child. This medicine may lower sperm counts. Talk to your health care professional or pharmacist for more information. Do not breast-feed an infant while taking this medicine or for 1 year after the last dose. What side effects may I notice from receiving this medicine? Side effects that you should report to your doctor or health care professional as soon as possible: -allergic reactions like skin rash, itching or hives, swelling of the face, lips, or tongue -feeling faint or lightheaded, falls -pain, tingling, numbness, or weakness in the legs -signs and symptoms of infection like fever or chills; cough; flu-like symptoms; sore throat -vaginal bleeding Side effects that usually do not require medical attention (report to your doctor or health care professional if they continue or are bothersome): -aches, pains -constipation -diarrhea -headache -hot flashes -nausea, vomiting -pain at site where injected -stomach pain This list may not describe all possible side effects. Call your doctor for medical advice about side effects. You may report side effects to FDA at 1-800-FDA-1088. Where should I keep my medicine? This drug is given in a hospital or clinic and will not be stored at home. NOTE: This sheet is a summary. It may not cover all possible information. If you have questions about this medicine, talk to your doctor, pharmacist, or health  care provider.    2016, [employer]. (2015-05-03 11:03:55)    Zoledronic Acid injection (Hypercalcemia, Oncology) What is this medicine? ZOLEDRONIC ACID (ZOE le dron ik AS id) lowers the amount of calcium loss  from bone. It is used to treat too much calcium in your blood from cancer. It is also used to prevent complications of cancer that has spread to the bone. This medicine may be used for other purposes; ask your health care provider or pharmacist if you have questions. What should I tell my health care provider before I take this medicine? They need to know if you have any of these conditions: -aspirin-sensitive asthma -cancer, especially if you are receiving medicines used to treat cancer -dental disease or wear dentures -infection -kidney disease -receiving corticosteroids like dexamethasone or prednisone -an unusual or allergic reaction to zoledronic acid, other medicines, foods, dyes, or preservatives -pregnant or trying to get pregnant -breast-feeding How should I use this medicine? This medicine is for infusion into a vein. It is given by a health care professional in a hospital or clinic setting. Talk to your pediatrician regarding the use of this medicine in children. Special care may be needed. Overdosage: If you think you have taken too much of this medicine contact a poison control center or emergency room at once. NOTE: This medicine is only for you. Do not share this medicine with others. What if I miss a dose? It is important not to miss your dose. Call your doctor or health care professional if you are unable to keep an appointment. What may interact with this medicine? -certain antibiotics given by injection -NSAIDs, medicines for pain and inflammation, like ibuprofen or naproxen -some diuretics like bumetanide, furosemide -teriparatide -thalidomide This list may not describe all possible interactions. Give your health care provider a list of all the medicines, herbs, non-prescription drugs, or dietary supplements you use. Also tell them if you smoke, drink alcohol, or use illegal drugs. Some items may interact with your medicine. What should I watch for while using this  medicine? Visit your doctor or health care professional for regular checkups. It may be some time before you see the benefit from this medicine. Do not stop taking your medicine unless your doctor tells you to. Your doctor may order blood tests or other tests to see how you are doing. Women should inform their doctor if they wish to become pregnant or think they might be pregnant. There is a potential for serious side effects to an unborn child. Talk to your health care professional or pharmacist for more information. You should make sure that you get enough calcium and vitamin D while you are taking this medicine. Discuss the foods you eat and the vitamins you take with your health care professional. Some people who take this medicine have severe bone, joint, and/or muscle pain. This medicine may also increase your risk for jaw problems or a broken thigh bone. Tell your doctor right away if you have severe pain in your jaw, bones, joints, or muscles. Tell your doctor if you have any pain that does not go away or that gets worse. Tell your dentist and dental surgeon that you are taking this medicine. You should not have major dental surgery while on this medicine. See your dentist to have a dental exam and fix any dental problems before starting this medicine. Take good care of your teeth while on this medicine. Make sure you see your dentist for regular follow-up appointments. What side effects  may I notice from receiving this medicine? Side effects that you should report to your doctor or health care professional as soon as possible: -allergic reactions like skin rash, itching or hives, swelling of the face, lips, or tongue -anxiety, confusion, or depression -breathing problems -changes in vision -eye pain -feeling faint or lightheaded, falls -jaw pain, especially after dental work -mouth sores -muscle cramps, stiffness, or weakness -redness, blistering, peeling or loosening of the skin, including  inside the mouth -trouble passing urine or change in the amount of urine Side effects that usually do not require medical attention (report to your doctor or health care professional if they continue or are bothersome): -bone, joint, or muscle pain -constipation -diarrhea -fever -hair loss -irritation at site where injected -loss of appetite -nausea, vomiting -stomach upset -trouble sleeping -trouble swallowing -weak or tired This list may not describe all possible side effects. Call your doctor for medical advice about side effects. You may report side effects to FDA at 1-800-FDA-1088. Where should I keep my medicine? This drug is given in a hospital or clinic and will not be stored at home. NOTE: This sheet is a summary. It may not cover all possible information. If you have questions about this medicine, talk to your doctor, pharmacist, or health care provider.    2016, [employer]. (2014-03-03 14:19:39)

## 2016-04-01 NOTE — Telephone Encounter (Signed)
appt made and avs printed °

## 2016-04-01 NOTE — Progress Notes (Signed)
[facility]  [phone] [phone]   ID: Lauren Buchanan DOB: 1924-11-18  MR#: [id number]  JSX#:[id number]  Patient Care Team: Roderick Pee, MD as PCP - General (Family Medicine) Claud Kelp, MD as Consulting Physician (General Surgery) Lowella Dell, MD as Consulting Physician (Oncology) Cambri Puffer, MD as Consulting Physician (Radiation Oncology) Donnelly Angelica, RN as Registered Nurse Pershing Proud, RN as Registered Nurse PCP: Evette Georges, MD GYN: OTHER MD: Marcelyn Bruins MD, Hillis Range MD, Lurena Nida MD, Venancio Poisson MD  CHIEF COMPLAINT: bilateral estrogen receptor positive breast cancer, metastatic to bone  CURRENT TREATMENT: fulvestrant, zolendronate  NOTE: Patient requests her daughter Lauren Buchanan be contacted regarding all appointments and procedures. Lauren Buchanan can be reached at [phone]  BREAST CANCER HISTORY: From the original intake note:  "Lauren Buchanan"  Has been aware of a sore right nipple for the last 6-8 months. She attributed it to her washing machine, which rubs that area when she uses it. On  11/21/2014 she saw Dr. 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 She mentioned the chest wall problem and Dr. Nicholas Lose obtained to skin biopsies 1 from the right inferior central chest and the other one of from the right mid medial chest.  The second of these showed a nodular basal cell carcinoma. The first one however showed invasive carcinoma consistent with a breast primary. This tumor was estrogen receptor positive at 100%, progesterone receptor positive at 36%, both with strong staining intensity, and HER-2 was amplified , the signals ratio being 6.2. Lauren Buchanan [phone])  On 12/06/2014 the patient underwent bilateral diagnostic mammography with bilateral ultrasonography. Breast density was category B. There was bilateral nipple retraction. In the left breast upper outer quadrant there was a spiculated mass noted by  mammography with a second spiculated mass in the lower inner quadrant. By ultrasound there was a 1.6 cm hypoechoic lesion at the 1:30 o'clock position in the left breast, and a second mass more medially measuring 0.9 cm. Also at the 8:00 position in the left breast 2 cm from the nipple there was a 1.3 cm mass. Ultrasound of the left axilla showed an enlarged lymph node with fatty hilum replacement, measuring 1.4 cm.  In the right breast mammography showed a cluster of indeterminate microcalcifications in the upper outer quadrant and a retroareolar mass, which bile does sound measured 2.9 cm. Ultrasound of the right axilla showed a right axilla lymph node measuring 1.4 cm.  On 12/06/2014 the patient underwent needle core biopsy of 2 separate masses in the left breast (at 1:00 and 8:00) as well as the enlarged axillary lymph node. All 3 were positive for an invasive lobular carcinoma (E-cadherin negative). All 3 tumors were estrogen receptor positive with strong staining intensity (between 87% and 100%). The lymph node was progesterone receptor negative, the other 2 masses being progesterone receptor positive at 9% and at 76%. This cancer was reported to be HER-2 positive at conference today, though I do not have the ratios at hand.  The patient is not an MRI candidate because she has a permanent pacemaker in place. Her subsequent history is as detailed below   INTERVAL HISTORY: Lauren Buchanan returns today for follow-up of her metastatic breast cancer, accompanied by a family friend. At her last visit she was started on fulvestrant. She is due for her first monthly dose today. She tolerated the previous injections well with no issues.   REVIEW OF SYSTEMS: Lauren Buchanan denies fevers, chills,  nausea, vomiting, or changes in bowel or bladder habits. She is in no pain. Her appetite is good. She is sleeping well. She denies shortness of breath, chest pain, cough, or palpitations. She has no unusual headaches, dizziness, or vision  changes.  A detailed review of systems is otherwise stable.  PAST MEDICAL HISTORY: Past Medical History  Diagnosis Date  . DJD (degenerative joint disease)   . IBS (irritable bowel syndrome)   . Goiter   . Complete heart block ([place]) 1999    s/p PPM by Dr Olevia Perches, most recent generator change 2009  . Renal cyst   . COPD (chronic obstructive pulmonary disease) ([place])   . Headache(784.0)   . Pacemaker   . Wears glasses   . Hiatal hernia   . Esophageal stricture   . Diverticulosis   . Asthma   . Pneumonia   . Giant cell arteritis ([place])   . Cancer of central portion of female breast ([place]) 12/04/2014  . Cancer of central portion of female breast ([place]) 12/04/2014  . Breast cancer ([place]) 12/08/2014    Bilateral  . Chest pain   . GERD (gastroesophageal reflux disease)   . History of general anesthesia complication   . Heart murmur   . Hemorrhoids   . S/P radiation therapy 04/24/15 completed    T-11    PAST SURGICAL HISTORY: Past Surgical History  Procedure Laterality Date  . Pacemaker insertion  1999    Gen change ([facility]) by Dr Olevia Perches 2009  . Cholecystectomy    . Left lower lobectomy for brochiectasis  1950  . Hemorrhoidectomy    . Renal cyst excision    . Tonsillectomy    . Fracture surgery      fx lt wrist  . Colonoscopy    . Artery biopsy Left 01/03/2013    Procedure: BIOPSY LEFT TEMPORAL ARTERY;  Surgeon: Ascencion Dike, MD;  Location: [facility];  Service: ENT;  Laterality: Left;  . Anal fissure repair    . Breast biopsy Right   . Rectal polypectomy    . Mouth surgery      FAMILY HISTORY Family History  Problem Relation Age of Onset  . Heart disease Mother   . Breast cancer Mother   . Stomach cancer Father   . Seizures Son   . Thyroid disease Sister   . Alcohol abuse Father    the patient's father died at the age of 4, with stomach cancer. The patient's mother died at the age of 104 from a myocardial infarction. She had been diagnosed with breast cancer in  her 40s. The patient had no brothers, 2 sisters. There is no other history of breast or ovarian cancer in the family to the patient's knowledge  GYNECOLOGIC HISTORY:  No LMP recorded. Patient is postmenopausal. Menarche age 40, first live birth age 52. The patient is GX P4. She underwent menopause in her late 21s. She did not take hormone replacement.  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 She is a widow, lives by herself, with no pets. Daughter Lauren Buchanan lives in [place] where she is Mudlogger of a preschool and day care. Son Lauren Buchanan lives in [place] where he owns a furniture to sign store. Daughter Lauren Buchanan lives in [place]. She owns a Chiropractor. Son Lauren Buchanan is physically and mentally disabled and lives in a group home in [place]. The patient has 7 grandchildren and 2 great-grandchildren. She  attends a local [place] DIRECTIVES: In place; the patient's daughter Lauren Buchanan is her healthcare power of attorney. She can be reached at [place]: Social History  Substance Use Topics  . Smoking status: Never Smoker   . Smokeless tobacco: Never Used  . Alcohol Use: Not on file     Comment: Occasional      Colonoscopy:  PAP:  Bone density:02/19/2015, osteoporosis  Lipid panel:  Allergies  Allergen Reactions  . Morphine And Related Nausea Only  . Amoxicillin-Pot Clavulanate Other (See Comments)    Unknown     Current Outpatient Prescriptions  Medication Sig Dispense Refill  . Calcium Citrate-Vitamin D (CITRACAL PETITES/VITAMIN D) 200-250 MG-UNIT TABS Take 1 tablet by mouth daily.      . metoprolol succinate (TOPROL-XL) 100 MG 24 hr tablet TAKE 1/2 (ONE-HALF) TABLET BY MOUTH EVERY MORNING 30 tablet 4  . naproxen sodium (ANAPROX) 220 MG tablet Take 220 mg by mouth 2 (two) times daily with a meal.     . predniSONE (DELTASONE) 5 MG tablet Take 5 mg by mouth daily with breakfast. Reported on  11/19/2015    . Probiotic Product (PROBIOTIC DAILY PO) Take 1 tablet by mouth daily. 1 Chew daily.    . traMADol (ULTRAM) 50 MG tablet Take 50 mg by mouth every 6 (six) hours as needed for moderate pain or severe pain. Reported on 11/19/2015    . TURMERIC PO Take 1 capsule by mouth daily.    . vitamin C (ASCORBIC ACID) 500 MG tablet Take 500 mg by mouth daily.      . [DISCONTINUED] loratadine (CLARITIN) 10 MG tablet Take 10 mg by mouth daily.       No current facility-administered medications for this visit.    OBJECTIVE: Elderly white woman who appears stated age 82 Vitals:   04/01/16 1126  BP: 145/65  Pulse: 87  Temp: 98.7 F (37.1 C)  Resp: 18     Body mass index is 24.39 kg/(m^2).    ECOG FS:1 - Symptomatic but completely ambulatory  Skin: warm, dry  HEENT: sclerae anicteric, conjunctivae pink, oropharynx clear. No thrush or mucositis.  Lymph Nodes: No cervical or supraclavicular lymphadenopathy  Lungs: clear to auscultation bilaterally, no rales, wheezes, or rhonci  Heart: regular rate and rhythm  Abdomen: round, soft, non tender, positive bowel sounds  Musculoskeletal: mild kyphosis, No focal spinal tenderness, no peripheral edema  Neuro: non focal, well oriented, positive affect  Breasts: deferred  LAB RESULTS:  CMP     Component Value Date/Time   NA 139 02/24/2016 1424   NA 138 03/07/2015 0925   K 4.6 02/24/2016 1424   K 4.2 03/07/2015 0925   CL 104 03/07/2015 0925   CO2 26 02/24/2016 1424   CO2 28 03/07/2015 0925   GLUCOSE 107 02/24/2016 1424   GLUCOSE 79 03/07/2015 0925   BUN 18.1 02/24/2016 1424   BUN 14 03/07/2015 0925   CREATININE 0.9 02/24/2016 1424   CREATININE 0.56 03/07/2015 0925   CALCIUM 10.1 02/24/2016 1424   CALCIUM 9.0 03/07/2015 0925   PROT 7.1 02/24/2016 1424   PROT 6.9 09/27/2014 1600   ALBUMIN 3.8 02/24/2016 1424   ALBUMIN 3.7 09/27/2014 1600   AST 28 02/24/2016 1424   AST 30 09/27/2014 1600   ALT 29 02/24/2016 1424   ALT 27  09/27/2014 1600   ALKPHOS 86 02/24/2016 1424   ALKPHOS 66 09/27/2014 1600   BILITOT 0.76 02/24/2016 1424  BILITOT 0.5 09/27/2014 1600   GFRNONAA >60 03/07/2015 0925   GFRAA >60 03/07/2015 0925    INo results found for: SPEP, UPEP  Lab Results  Component Value Date   WBC 6.6 04/01/2016   NEUTROABS 4.6 04/01/2016   HGB 13.6 04/01/2016   HCT 41.5 04/01/2016   MCV 92.4 04/01/2016   PLT 191 04/01/2016      Chemistry      Component Value Date/Time   NA 139 02/24/2016 1424   NA 138 03/07/2015 0925   K 4.6 02/24/2016 1424   K 4.2 03/07/2015 0925   CL 104 03/07/2015 0925   CO2 26 02/24/2016 1424   CO2 28 03/07/2015 0925   BUN 18.1 02/24/2016 1424   BUN 14 03/07/2015 0925   CREATININE 0.9 02/24/2016 1424   CREATININE 0.56 03/07/2015 0925      Component Value Date/Time   CALCIUM 10.1 02/24/2016 1424   CALCIUM 9.0 03/07/2015 0925   ALKPHOS 86 02/24/2016 1424   ALKPHOS 66 09/27/2014 1600   AST 28 02/24/2016 1424   AST 30 09/27/2014 1600   ALT 29 02/24/2016 1424   ALT 27 09/27/2014 1600   BILITOT 0.76 02/24/2016 1424   BILITOT 0.5 09/27/2014 1600       Lab Results  Component Value Date   LABCA2 16 02/24/2016    No components found for: [id number]  No results for input(s): INR in the last 168 hours.  Urinalysis    Component Value Date/Time   COLORURINE YELLOW 09/27/2014 1800   APPEARANCEUR CLEAR 09/27/2014 1800   LABSPEC 1.005 09/27/2014 1800   PHURINE 7.0 09/27/2014 1800   GLUCOSEU NEGATIVE 09/27/2014 1800   HGBUR NEGATIVE 09/27/2014 1800   HGBUR trace-intact 01/17/2010 0932   BILIRUBINUR NEGATIVE 09/27/2014 1800   BILIRUBINUR n 09/01/2011 1157   KETONESUR NEGATIVE 09/27/2014 1800   PROTEINUR NEGATIVE 09/27/2014 1800   PROTEINUR n 09/01/2011 1157   UROBILINOGEN 0.2 09/27/2014 1800   UROBILINOGEN 0.2 09/01/2011 1157   NITRITE NEGATIVE 09/27/2014 1800   NITRITE n 09/01/2011 1157   LEUKOCYTESUR TRACE* 09/27/2014 1800    STUDIES: No results found.    ASSESSMENT: 82 y.o.  woman with bilateral breast cancer, as follows:  (1) status post right chest skin biopsy 11/21/2014 for a clinical T4 N1, stage IIIB invasive ductal carcinoma, estrogen and progesterone receptor positive, HER-2 amplified  (2) status post 2 separate left breast biopsies and one left axillary lymph node biopsy 12/04/2014, all positive for a clinical mT1c N1, stage IIA invasive lobular carcinoma, estrogen receptor positive, progesterone receptor variable, with MIB-1 between 10 and 40%, and no HER-2 amplification  (3) neoadjuvant anastrozole started 12/14/2014. Discontinued with progression  (4) definitive surgery to consist of bilateral mastectomies without formal axillary dissection: Postponed  (5) PET scan 12/19/2014 shows in addition to the bilateral breast and bilateral axillary masses, a lesion at the T11 vertebral body, showing an irregular lucency on prior CT exam  (a) adjuvant radiationt to T10-T12: 04/11/2015 through 04/24/2015 Site/dose: The patient was treated to the T10-T12 vertebral bodies to a total dose of 30 gray in 10 fractions at 3 gray per fraction. This was accomplished using a 2 field, 3-D conformal technique.  (6) consider anti-HER-2 treatment depending on response to antiestrogen therapy alone. On hold.   (7) osteoporosis with T score of -2.6 on bone scan obtained 02/19/2015.  (a) zolendronate started 03/20/2015, repeated every 12 weeks   (8) fulvestrant started 03/04/16  PLAN: Lauren Buchanan is tolerating the fulvestrant well so far with  no issues. She will begin her monthly dosing with today's injection. She has no other complaints or needs to address during this visit. Her next zometa dose is due in August.  Lauren Buchanan will return in 4 weeks for follow up with Dr. Jana Hakim. During this visit he will consider adding palbociclib. She understands and agrees with this plan. She knows the goal of treatment in her case is control. She has been encouraged to  call with any issues that might arise before her next visit here.    Laurie Panda, NP   04/01/2016 11:40 AM

## 2016-04-02 LAB — CANCER ANTIGEN 27.29: CA 27.29: 12.7 U/mL (ref 0.0–38.6)

## 2016-04-03 LAB — CANCER ANTIGEN 27-29 (PARALLEL TESTING): CA 27.29: 20 U/mL (ref <38)

## 2016-04-28 DIAGNOSIS — Z170 Estrogen receptor positive status [ER+]: Secondary | ICD-10-CM

## 2016-04-28 DIAGNOSIS — C50811 Malignant neoplasm of overlapping sites of right female breast: Secondary | ICD-10-CM | POA: Insufficient documentation

## 2016-04-28 NOTE — Progress Notes (Signed)
[facility]  [phone] [phone]   ID: Lauren Buchanan DOB: [birth year]  MR#: [id number]  ONG#:[id number]  Patient Care Team: Dorena Cookey, MD as PCP - General (Family Medicine) Fanny Skates, MD as Consulting Physician (General Surgery) Chauncey Cruel, MD as Consulting Physician (Oncology) [name], MD as Consulting Physician (Radiation Oncology) Rockwell Germany, RN as Registered Nurse Mauro Kaufmann, RN as Registered Nurse PCP: Joycelyn Man, MD GYN: OTHER MD: Danton Sewer MD, Thompson Grayer MD, Unice Bailey MD, Rolm Bookbinder MD  CHIEF COMPLAINT: bilateral estrogen receptor positive breast cancer, metastatic to bone  CURRENT TREATMENT: fulvestrant, zolendronate  NOTE: Patient requests her daughter [name] be contacted regarding all appointments and procedures. [name] can be reached at [place]: From the original intake note:  "Lauren Buchanan"  Has been aware of a sore right nipple for the last 6-8 months. She attributed it to her washing machine, which rubs that area when she uses it. On  11/21/2014 she saw Dr. 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 She mentioned the chest wall problem and Dr. Ubaldo Glassing obtained to skin biopsies 1 from the right inferior central chest and the other one of from the right mid medial chest.  The second of these showed a nodular basal cell carcinoma. The first one however showed invasive carcinoma consistent with a breast primary. This tumor was estrogen receptor positive at 100%, progesterone receptor positive at 36%, both with strong staining intensity, and HER-2 was amplified , the signals ratio being 6.2. [name] [id number])  On 12/06/2014 the patient underwent bilateral diagnostic mammography with bilateral ultrasonography. Breast density was category B. There was bilateral nipple retraction. In the left breast upper outer quadrant there was a spiculated mass noted by  mammography with a second spiculated mass in the lower inner quadrant. By ultrasound there was a 1.6 cm hypoechoic lesion at the 1:30 o'clock position in the left breast, and a second mass more medially measuring 0.9 cm. Also at the 8:00 position in the left breast 2 cm from the nipple there was a 1.3 cm mass. Ultrasound of the left axilla showed an enlarged lymph node with fatty hilum replacement, measuring 1.4 cm.  In the right breast mammography showed a cluster of indeterminate microcalcifications in the upper outer quadrant and a retroareolar mass, which bile does sound measured 2.9 cm. Ultrasound of the right axilla showed a right axilla lymph node measuring 1.4 cm.  On 12/06/2014 the patient underwent needle core biopsy of 2 separate masses in the left breast (at 1:00 and 8:00) as well as the enlarged axillary lymph node. All 3 were positive for an invasive lobular carcinoma (E-cadherin negative). All 3 tumors were estrogen receptor positive with strong staining intensity (between 87% and 100%). The lymph node was progesterone receptor negative, the other 2 masses being progesterone receptor positive at 9% and at 76%. This cancer was reported to be HER-2 positive at conference today, though I do not have the ratios at hand.  The patient is not an MRI candidate because she has a permanent pacemaker in place. Her subsequent history is as detailed below   INTERVAL HISTORY: Lauren Buchanan returns today for follow-up of her stage IV estrogen receptor positive breast cancer, accompanied by her daughter.  Lauren Buchanan will receive her monthly fulvestrant today. She will not be due for her repeat zolendronate until mid-September.  REVIEW OF SYSTEMS: Lauren Buchanan is doing "fairly well". [facility]. She does not use the exercise facilities there she has had no unusual headaches, no visual changes, no nausea or vomiting, and no change in bowel or bladder habits. She denies shortness of breath, cough, or pleurisy.  She denies pain other than mild stiffness sometimes. A detailed review of systems today was otherwise stable.  PAST MEDICAL HISTORY: Past Medical History  Diagnosis Date  . DJD (degenerative joint disease)   . IBS (irritable bowel syndrome)   . Goiter   . Complete heart block ([place]) 1999    s/p PPM by Dr Olevia Perches, most recent generator change 2009  . Renal cyst   . COPD (chronic obstructive pulmonary disease) ([place])   . Headache(784.0)   . Pacemaker   . Wears glasses   . Hiatal hernia   . Esophageal stricture   . Diverticulosis   . Asthma   . Pneumonia   . Giant cell arteritis ([place])   . Cancer of central portion of female breast ([place]) 12/04/2014  . Cancer of central portion of female breast ([place]) 12/04/2014  . Breast cancer ([place]) 12/08/2014    Bilateral  . Chest pain   . GERD (gastroesophageal reflux disease)   . History of general anesthesia complication   . Heart murmur   . Hemorrhoids   . S/P radiation therapy 04/24/15 completed    T-11    PAST SURGICAL HISTORY: Past Surgical History  Procedure Laterality Date  . Pacemaker insertion  1999    Gen change ([facility]) by Dr Olevia Perches 2009  . Cholecystectomy    . Left lower lobectomy for brochiectasis  1950  . Hemorrhoidectomy    . Renal cyst excision    . Tonsillectomy    . Fracture surgery      fx lt wrist  . Colonoscopy    . Artery biopsy Left 01/03/2013    Procedure: BIOPSY LEFT TEMPORAL ARTERY;  Surgeon: Ascencion Dike, MD;  Location: [facility];  Service: ENT;  Laterality: Left;  . Anal fissure repair    . Breast biopsy Right   . Rectal polypectomy    . Mouth surgery      FAMILY HISTORY Family History  Problem Relation Age of Onset  . Heart disease Mother   . Breast cancer Mother   . Stomach cancer Father   . Seizures Son   . Thyroid disease Sister   . Alcohol abuse Father    the patient's father died at the age of 61, with stomach cancer. The patient's mother died at the age of 78 from a myocardial  infarction. She had been diagnosed with breast cancer in her 69s. The patient had no brothers, 2 sisters. There is no other history of breast or ovarian cancer in the family to the patient's knowledge  GYNECOLOGIC HISTORY:  No LMP recorded. Patient is postmenopausal. Menarche age 23, first live birth age 43. The patient is GX P4. She underwent menopause in her late 33s. She did not take hormone replacement.  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 She is a widow, lives by herself, with no pets. Daughter Wendy Poet lives in [place] where she is Mudlogger of a preschool and day care. Son Jayley Hustead lives in [place] where he owns a furniture to sign store. Daughter Shelly Rubenstein lives in [place]. She owns a Chiropractor. Son Randall Hiss is physically and mentally disabled and lives in a group home in [place]. The patient has 7  grandchildren and 2 great-grandchildren. She attends a local [employer]: In place; the patient's daughter [name] is her healthcare power of attorney. She can be reached at [place]: Social History  Substance Use Topics  . Smoking status: Never Smoker   . Smokeless tobacco: Never Used  . Alcohol Use: Not on file     Comment: Occasional      Colonoscopy:  PAP:  Bone density:02/19/2015, osteoporosis  Lipid panel:  Allergies  Allergen Reactions  . Morphine And Related Nausea Only  . Amoxicillin-Pot Clavulanate Other (See Comments)    Unknown     Current Outpatient Prescriptions  Medication Sig Dispense Refill  . Calcium Citrate-Vitamin D (CITRACAL PETITES/VITAMIN D) 200-250 MG-UNIT TABS Take 1 tablet by mouth daily.      . metoprolol succinate (TOPROL-XL) 100 MG 24 hr tablet TAKE 1/2 (ONE-HALF) TABLET BY MOUTH EVERY MORNING 30 tablet 4  . naproxen sodium (ANAPROX) 220 MG tablet Take 220 mg by mouth 2 (two) times daily with a meal.     . predniSONE (DELTASONE) 5 MG tablet  Take 5 mg by mouth daily with breakfast. Reported on 11/19/2015    . Probiotic Product (PROBIOTIC DAILY PO) Take 1 tablet by mouth daily. 1 Chew daily.    . traMADol (ULTRAM) 50 MG tablet Take 50 mg by mouth every 6 (six) hours as needed for moderate pain or severe pain. Reported on 11/19/2015    . TURMERIC PO Take 1 capsule by mouth daily.    . vitamin C (ASCORBIC ACID) 500 MG tablet Take 500 mg by mouth daily.      . [DISCONTINUED] loratadine (CLARITIN) 10 MG tablet Take 10 mg by mouth daily.       No current facility-administered medications for this visit.    OBJECTIVE: Elderly white woman who appears well  Filed Vitals:   04/29/16 0952  BP: 128/63  Pulse: 78  Temp: 98.4 F (36.9 C)  Resp: 18     Body mass index is 24.12 kg/(m^2).    ECOG FS:0 - Asymptomatic  Sclerae unicteric, EOMs intact Oropharynx clear and moist No cervical or supraclavicular adenopathy Lungs no rales or rhonchi Heart regular rate and rhythm Abd soft, nontender, positive bowel sounds MSK no focal spinal tenderness, no upper extremity lymphedema Neuro: nonfocal, well oriented, appropriate affect Breasts: There is some distortion around the right nipple area, without a clearly palpable mass, although there is some induration. There is no erythema. The right axilla is benign. The left breast is unremarkable, with no masses palpated. The left axilla is benign.   LAB RESULTS:  CMP     Component Value Date/Time   NA 139 04/29/2016 0931   NA 138 03/07/2015 0925   K 3.7 04/29/2016 0931   K 4.2 03/07/2015 0925   CL 104 03/07/2015 0925   CO2 27 04/29/2016 0931   CO2 28 03/07/2015 0925   GLUCOSE 111 04/29/2016 0931   GLUCOSE 79 03/07/2015 0925   BUN 14.8 04/29/2016 0931   BUN 14 03/07/2015 0925   CREATININE 0.8 04/29/2016 0931   CREATININE 0.56 03/07/2015 0925   CALCIUM 9.4 04/29/2016 0931   CALCIUM 9.0 03/07/2015 0925   PROT 6.9 04/29/2016 0931   PROT 6.9 09/27/2014 1600   ALBUMIN 3.6 04/29/2016 0931     ALBUMIN 3.7 09/27/2014 1600   AST 27 04/29/2016 0931   AST 30 09/27/2014 1600   ALT 21 04/29/2016 0931   ALT 27 09/27/2014 1600  ALKPHOS 81 04/29/2016 0931   ALKPHOS 66 09/27/2014 1600   BILITOT 1.18 04/29/2016 0931   BILITOT 0.5 09/27/2014 1600   GFRNONAA >60 03/07/2015 0925   GFRAA >60 03/07/2015 0925    INo results found for: SPEP, UPEP  Lab Results  Component Value Date   WBC 5.3 04/29/2016   NEUTROABS 3.4 04/29/2016   HGB 13.8 04/29/2016   HCT 42.0 04/29/2016   MCV 91.9 04/29/2016   PLT 224 04/29/2016      Chemistry      Component Value Date/Time   NA 139 04/29/2016 0931   NA 138 03/07/2015 0925   K 3.7 04/29/2016 0931   K 4.2 03/07/2015 0925   CL 104 03/07/2015 0925   CO2 27 04/29/2016 0931   CO2 28 03/07/2015 0925   BUN 14.8 04/29/2016 0931   BUN 14 03/07/2015 0925   CREATININE 0.8 04/29/2016 0931   CREATININE 0.56 03/07/2015 0925      Component Value Date/Time   CALCIUM 9.4 04/29/2016 0931   CALCIUM 9.0 03/07/2015 0925   ALKPHOS 81 04/29/2016 0931   ALKPHOS 66 09/27/2014 1600   AST 27 04/29/2016 0931   AST 30 09/27/2014 1600   ALT 21 04/29/2016 0931   ALT 27 09/27/2014 1600   BILITOT 1.18 04/29/2016 0931   BILITOT 0.5 09/27/2014 1600       Lab Results  Component Value Date   LABCA2 20 04/01/2016    No components found for: [id number]  No results for input(s): INR in the last 168 hours.  Urinalysis    Component Value Date/Time   COLORURINE YELLOW 09/27/2014 1800   APPEARANCEUR CLEAR 09/27/2014 1800   LABSPEC 1.005 09/27/2014 1800   PHURINE 7.0 09/27/2014 1800   GLUCOSEU NEGATIVE 09/27/2014 1800   HGBUR NEGATIVE 09/27/2014 1800   HGBUR trace-intact 01/17/2010 0932   BILIRUBINUR NEGATIVE 09/27/2014 1800   BILIRUBINUR n 09/01/2011 1157   KETONESUR NEGATIVE 09/27/2014 1800   PROTEINUR NEGATIVE 09/27/2014 1800   PROTEINUR n 09/01/2011 1157   UROBILINOGEN 0.2 09/27/2014 1800   UROBILINOGEN 0.2 09/01/2011 1157   NITRITE NEGATIVE  09/27/2014 1800   NITRITE n 09/01/2011 1157   LEUKOCYTESUR TRACE* 09/27/2014 1800    STUDIES: No results found.  ASSESSMENT: 82 y.o. [place] woman with bilateral breast cancer, as follows:  (1) status post right breastt overlapping sites skin biopsy 11/21/2014 for a clinical T4 N1, stage IIIB invasive ductal carcinoma, estrogen and progesterone receptor positive, HER-2 amplified  (2) status post 2 separate left breast upper outer quadrant biopsies and one left axillary lymph node biopsy 12/04/2014, all positive for a clinical mT1c N1, stage IIA invasive lobular carcinoma, estrogen receptor positive, progesterone receptor variable, with MIB-1 between 10 and 40%, and no HER-2 amplification  (3) neoadjuvant anastrozole started 12/14/2014. Discontinued with progression by repeat left breast US May 2017  (4) definitive surgery to consist of bilateral mastectomies without formal axillary dissection: Postponed  METASTATIC DISEASE: March 2016 (5) PET scan 12/19/2014 shows in addition to the bilateral breast and bilateral axillary masses, a lesion at the T11 vertebral body, showing an irregular lucency on prior CT exam  (a) adjuvant radiationt to T10-T12: 04/11/2015 through 04/24/2015 to a total dose of 30 gray in 10 fractions  (6) consider anti-HER-2 treatment depending on response to antiestrogen therapy alone.    (7) osteoporosis with T score of -2.6 on bone scan obtained 02/19/2015.  (a) zolendronate started 03/20/2015, repeated every 12 weeks   (8) fulvestrant started 03/04/16  PLAN: Lauren Buchanan is  tolerating the fulvestrant well and the plan is to continue that until there is evidence of disease progression.  Today we discussed palbociclib. She understands the mechanism of action of this drug, and that in at least 2 studies it has doubled in the time to disease progression in patients like her. We also discussed the possible toxicities, side effects and complications.   I have gone ahead and  entered the order. I am hopeful that she will be able to start this by the last week in July. That way when she returns to see me with her next fulvestrant dose, on August 19, she will have been on it a couple of weeks and we will be able to assess tolerance.  We will need to check her counts every 2 weeks. [facility] They will fax me the results. We do not need an initial labs before starting late July however since we have lab work today  Hopefully with this change we will maximize the length of time that she responds to the current treatment. Once she is on a stable dose of palbociclib, most likely in September, we will repeat her mammography and ultrasonography to follow her measurable disease  She knows to call for any problems that may develop before that visit.  Chauncey Cruel, MD   04/29/2016 10:17 AM

## 2016-04-29 ENCOUNTER — Ambulatory Visit (HOSPITAL_BASED_OUTPATIENT_CLINIC_OR_DEPARTMENT_OTHER): Payer: Medicare Other

## 2016-04-29 ENCOUNTER — Other Ambulatory Visit (HOSPITAL_BASED_OUTPATIENT_CLINIC_OR_DEPARTMENT_OTHER): Payer: Medicare Other

## 2016-04-29 ENCOUNTER — Encounter: Payer: Self-pay | Admitting: Pharmacist

## 2016-04-29 ENCOUNTER — Telehealth: Payer: Self-pay | Admitting: Oncology

## 2016-04-29 ENCOUNTER — Ambulatory Visit (HOSPITAL_BASED_OUTPATIENT_CLINIC_OR_DEPARTMENT_OTHER): Payer: Medicare Other | Admitting: Oncology

## 2016-04-29 VITALS — BP 128/63 | HR 78 | Temp 98.4°F | Resp 18 | Ht 60.0 in | Wt 123.5 lb

## 2016-04-29 DIAGNOSIS — C773 Secondary and unspecified malignant neoplasm of axilla and upper limb lymph nodes: Secondary | ICD-10-CM

## 2016-04-29 DIAGNOSIS — C7951 Secondary malignant neoplasm of bone: Secondary | ICD-10-CM

## 2016-04-29 DIAGNOSIS — C50812 Malignant neoplasm of overlapping sites of left female breast: Secondary | ICD-10-CM

## 2016-04-29 DIAGNOSIS — C50412 Malignant neoplasm of upper-outer quadrant of left female breast: Secondary | ICD-10-CM

## 2016-04-29 DIAGNOSIS — C50811 Malignant neoplasm of overlapping sites of right female breast: Secondary | ICD-10-CM

## 2016-04-29 DIAGNOSIS — Z5111 Encounter for antineoplastic chemotherapy: Secondary | ICD-10-CM

## 2016-04-29 DIAGNOSIS — M810 Age-related osteoporosis without current pathological fracture: Secondary | ICD-10-CM | POA: Diagnosis not present

## 2016-04-29 DIAGNOSIS — C7952 Secondary malignant neoplasm of bone marrow: Secondary | ICD-10-CM

## 2016-04-29 LAB — CBC WITH DIFFERENTIAL/PLATELET
BASO%: 0.5 % (ref 0.0–2.0)
BASOS ABS: 0 10*3/uL (ref 0.0–0.1)
EOS%: 2.5 % (ref 0.0–7.0)
Eosinophils Absolute: 0.1 10*3/uL (ref 0.0–0.5)
HEMATOCRIT: 42 % (ref 34.8–46.6)
HEMOGLOBIN: 13.8 g/dL (ref 11.6–15.9)
LYMPH#: 1 10*3/uL (ref 0.9–3.3)
LYMPH%: 19.2 % (ref 14.0–49.7)
MCH: 30.3 pg (ref 25.1–34.0)
MCHC: 32.9 g/dL (ref 31.5–36.0)
MCV: 91.9 fL (ref 79.5–101.0)
MONO#: 0.7 10*3/uL (ref 0.1–0.9)
MONO%: 13.4 % (ref 0.0–14.0)
NEUT#: 3.4 10*3/uL (ref 1.5–6.5)
NEUT%: 64.4 % (ref 38.4–76.8)
Platelets: 224 10*3/uL (ref 145–400)
RBC: 4.57 10*6/uL (ref 3.70–5.45)
RDW: 13.9 % (ref 11.2–14.5)
WBC: 5.3 10*3/uL (ref 3.9–10.3)

## 2016-04-29 LAB — COMPREHENSIVE METABOLIC PANEL
ALBUMIN: 3.6 g/dL (ref 3.5–5.0)
ALK PHOS: 81 U/L (ref 40–150)
ALT: 21 U/L (ref 0–55)
AST: 27 U/L (ref 5–34)
Anion Gap: 9 mEq/L (ref 3–11)
BUN: 14.8 mg/dL (ref 7.0–26.0)
CALCIUM: 9.4 mg/dL (ref 8.4–10.4)
CO2: 27 mEq/L (ref 22–29)
CREATININE: 0.8 mg/dL (ref 0.6–1.1)
Chloride: 104 mEq/L (ref 98–109)
EGFR: 64 mL/min/{1.73_m2} — ABNORMAL LOW (ref >90)
Glucose: 111 mg/dl (ref 70–140)
POTASSIUM: 3.7 meq/L (ref 3.5–5.1)
Sodium: 139 mEq/L (ref 136–145)
Total Bilirubin: 1.18 mg/dL (ref 0.20–1.20)
Total Protein: 6.9 g/dL (ref 6.4–8.3)

## 2016-04-29 MED ORDER — FULVESTRANT 250 MG/5ML IM SOLN
500.0000 mg | Freq: Once | INTRAMUSCULAR | Status: AC
  Administered 2016-04-29: 500 mg via INTRAMUSCULAR
  Filled 2016-04-29: qty 10

## 2016-04-29 MED ORDER — PALBOCICLIB 75 MG PO CAPS: 75.0000 mg | ORAL_CAPSULE | Freq: Every day | ORAL | Status: DC

## 2016-04-29 NOTE — Telephone Encounter (Signed)
appt made and avs printed °

## 2016-04-29 NOTE — Progress Notes (Signed)
Rx sent to [facility] OP Rx.  PA obtained effective until end of benefit year. I informed Bethena Roys at [facility] OP [facility].  If copay is too high, Bethena Roys will give pt 1st month free using voucher/coupon.   If we need to apply for pt assistance, we can. Will f/u w/ Bethena Roys by end of week.  Kennith Center, Pharm.D., CPP 04/29/2016'@4'$ :[facility]

## 2016-04-29 NOTE — Patient Instructions (Signed)
Fulvestrant injection What is this medicine? FULVESTRANT (ful VES trant) blocks the effects of estrogen. It is used to treat breast cancer. This medicine may be used for other purposes; ask your health care provider or pharmacist if you have questions. What should I tell my health care provider before I take this medicine? They need to know if you have any of these conditions: -bleeding problems -liver disease -low levels of platelets in the blood -an unusual or allergic reaction to fulvestrant, other medicines, foods, dyes, or preservatives -pregnant or trying to get pregnant -breast-feeding How should I use this medicine? This medicine is for injection into a muscle. It is usually given by a health care professional in a hospital or clinic setting. Talk to your pediatrician regarding the use of this medicine in children. Special care may be needed. Overdosage: If you think you have taken too much of this medicine contact a poison control center or emergency room at once. NOTE: This medicine is only for you. Do not share this medicine with others. What if I miss a dose? It is important not to miss your dose. Call your doctor or health care professional if you are unable to keep an appointment. What may interact with this medicine? -medicines that treat or prevent blood clots like warfarin, enoxaparin, and dalteparin This list may not describe all possible interactions. Give your health care provider a list of all the medicines, herbs, non-prescription drugs, or dietary supplements you use. Also tell them if you smoke, drink alcohol, or use illegal drugs. Some items may interact with your medicine. What should I watch for while using this medicine? Your condition will be monitored carefully while you are receiving this medicine. You will need important blood work done while you are taking this medicine. Do not become pregnant while taking this medicine or for at least 1 year after stopping  it. Women of child-bearing potential will need to have a negative pregnancy test before starting this medicine. Women should inform their doctor if they wish to become pregnant or think they might be pregnant. There is a potential for serious side effects to an unborn child. Men should inform their doctors if they wish to father a child. This medicine may lower sperm counts. Talk to your health care professional or pharmacist for more information. Do not breast-feed an infant while taking this medicine or for 1 year after the last dose. What side effects may I notice from receiving this medicine? Side effects that you should report to your doctor or health care professional as soon as possible: -allergic reactions like skin rash, itching or hives, swelling of the face, lips, or tongue -feeling faint or lightheaded, falls -pain, tingling, numbness, or weakness in the legs -signs and symptoms of infection like fever or chills; cough; flu-like symptoms; sore throat -vaginal bleeding Side effects that usually do not require medical attention (report to your doctor or health care professional if they continue or are bothersome): -aches, pains -constipation -diarrhea -headache -hot flashes -nausea, vomiting -pain at site where injected -stomach pain This list may not describe all possible side effects. Call your doctor for medical advice about side effects. You may report side effects to FDA at 1-800-FDA-1088. Where should I keep my medicine? This drug is given in a hospital or clinic and will not be stored at home. NOTE: This sheet is a summary. It may not cover all possible information. If you have questions about this medicine, talk to your doctor, pharmacist, or health  care provider.    2016, [employer]. (2015-05-03 11:03:55)    Zoledronic Acid injection (Hypercalcemia, Oncology) What is this medicine? ZOLEDRONIC ACID (ZOE le dron ik AS id) lowers the amount of calcium loss  from bone. It is used to treat too much calcium in your blood from cancer. It is also used to prevent complications of cancer that has spread to the bone. This medicine may be used for other purposes; ask your health care provider or pharmacist if you have questions. What should I tell my health care provider before I take this medicine? They need to know if you have any of these conditions: -aspirin-sensitive asthma -cancer, especially if you are receiving medicines used to treat cancer -dental disease or wear dentures -infection -kidney disease -receiving corticosteroids like dexamethasone or prednisone -an unusual or allergic reaction to zoledronic acid, other medicines, foods, dyes, or preservatives -pregnant or trying to get pregnant -breast-feeding How should I use this medicine? This medicine is for infusion into a vein. It is given by a health care professional in a hospital or clinic setting. Talk to your pediatrician regarding the use of this medicine in children. Special care may be needed. Overdosage: If you think you have taken too much of this medicine contact a poison control center or emergency room at once. NOTE: This medicine is only for you. Do not share this medicine with others. What if I miss a dose? It is important not to miss your dose. Call your doctor or health care professional if you are unable to keep an appointment. What may interact with this medicine? -certain antibiotics given by injection -NSAIDs, medicines for pain and inflammation, like ibuprofen or naproxen -some diuretics like bumetanide, furosemide -teriparatide -thalidomide This list may not describe all possible interactions. Give your health care provider a list of all the medicines, herbs, non-prescription drugs, or dietary supplements you use. Also tell them if you smoke, drink alcohol, or use illegal drugs. Some items may interact with your medicine. What should I watch for while using this  medicine? Visit your doctor or health care professional for regular checkups. It may be some time before you see the benefit from this medicine. Do not stop taking your medicine unless your doctor tells you to. Your doctor may order blood tests or other tests to see how you are doing. Women should inform their doctor if they wish to become pregnant or think they might be pregnant. There is a potential for serious side effects to an unborn child. Talk to your health care professional or pharmacist for more information. You should make sure that you get enough calcium and vitamin D while you are taking this medicine. Discuss the foods you eat and the vitamins you take with your health care professional. Some people who take this medicine have severe bone, joint, and/or muscle pain. This medicine may also increase your risk for jaw problems or a broken thigh bone. Tell your doctor right away if you have severe pain in your jaw, bones, joints, or muscles. Tell your doctor if you have any pain that does not go away or that gets worse. Tell your dentist and dental surgeon that you are taking this medicine. You should not have major dental surgery while on this medicine. See your dentist to have a dental exam and fix any dental problems before starting this medicine. Take good care of your teeth while on this medicine. Make sure you see your dentist for regular follow-up appointments. What side effects  may I notice from receiving this medicine? Side effects that you should report to your doctor or health care professional as soon as possible: -allergic reactions like skin rash, itching or hives, swelling of the face, lips, or tongue -anxiety, confusion, or depression -breathing problems -changes in vision -eye pain -feeling faint or lightheaded, falls -jaw pain, especially after dental work -mouth sores -muscle cramps, stiffness, or weakness -redness, blistering, peeling or loosening of the skin, including  inside the mouth -trouble passing urine or change in the amount of urine Side effects that usually do not require medical attention (report to your doctor or health care professional if they continue or are bothersome): -bone, joint, or muscle pain -constipation -diarrhea -fever -hair loss -irritation at site where injected -loss of appetite -nausea, vomiting -stomach upset -trouble sleeping -trouble swallowing -weak or tired This list may not describe all possible side effects. Call your doctor for medical advice about side effects. You may report side effects to FDA at 1-800-FDA-1088. Where should I keep my medicine? This drug is given in a hospital or clinic and will not be stored at home. NOTE: This sheet is a summary. It may not cover all possible information. If you have questions about this medicine, talk to your doctor, pharmacist, or health care provider.    2016, [employer]. (2014-03-03 14:19:39)

## 2016-04-30 ENCOUNTER — Encounter: Payer: Self-pay | Admitting: Pharmacist

## 2016-04-30 LAB — CANCER ANTIGEN 27.29: CAN 27.29: 11.3 U/mL (ref 0.0–38.6)

## 2016-04-30 MED FILL — *IBRANCE 75 MG CAPSULE: 75 | 21 days supply | Qty: 21 | Fill #0

## 2016-04-30 NOTE — Progress Notes (Signed)
[facility] OP [facility] has new Rx for Ibrance.  Gave pt 1st month free w/ voucher from [employer]. Copay $7289.79 per Gaspar Bidding at [facility] OP [facility].  Bethena Roys at [facility] has notified pt and pt is going to bring in income statements to OP Rx on 7/14 or 7/17 when she picks up her [facility] and Bethena Roys will complete paperwork to apply for copay assistance/free drug from manufacturer.  Pt counseled yesterday (04/29/16) when saw MD.  Pt needs labs scheduled - will forward request to Dr. Jana Hakim & Marlon Pel, RN  We will f/u w/ pt in 1-2 weeks to determine tolerance, compliance, etc.  Kennith Center, Pharm.D., CPP 04/30/2016'@4'$ :[facility]

## 2016-05-04 ENCOUNTER — Telehealth: Payer: Self-pay | Admitting: Pharmacist

## 2016-05-04 NOTE — Telephone Encounter (Addendum)
Received communication from [facility] that pt picked up her [facility] Rx today and brought income documents needed to  [facility] outpatient rx.  Raul Del, PharmD, BCPS, [facility] [phone]

## 2016-05-05 NOTE — Progress Notes (Signed)
17In Box request sent to scheduling for weekly labs per pt pickup of medication on 7/17

## 2016-05-06 ENCOUNTER — Telehealth: Payer: Self-pay | Admitting: Oncology

## 2016-05-06 ENCOUNTER — Telehealth: Payer: Self-pay

## 2016-05-06 NOTE — Telephone Encounter (Signed)
lvm to inform pt of 7/25 and 8/1 lab only appts at 11:15 am

## 2016-05-06 NOTE — Telephone Encounter (Signed)
[name] called stating that after her mother read the insert on the Ibrance she decided not to take the medication. [name] states her mother does not want to "take things that will prolong her life". [name] will look into returning the unopened Ibrance to [facility] OP pharm.  [name] is also asking if Dr Jana Hakim can give a life expectancy estimate for her mother.

## 2016-05-08 DIAGNOSIS — R0989 Other specified symptoms and signs involving the circulatory and respiratory systems: Secondary | ICD-10-CM

## 2016-05-11 ENCOUNTER — Encounter (INDEPENDENT_AMBULATORY_CARE_PROVIDER_SITE_OTHER): Payer: Self-pay | Admitting: Internal Medicine

## 2016-05-11 ENCOUNTER — Telehealth: Payer: Self-pay | Admitting: *Deleted

## 2016-05-11 NOTE — Telephone Encounter (Signed)
This RN spoke with pt's dtr- [name] per her call last week.  Inquiry was " what is my mom's prognosis if she does not take the Ibrance "  This RN discussed above in relation to pt's known area of cancer- presently pt is not known to be progressing and is not hospice appropriate.  Discussed general issues per older women who have stage IV disease - goal of therapy is to keep cancer from causing symptoms that could interfere with daily life activies.  [name] stated her mother has been " somewhat depressed " since husband passed away 3 years ago - " and then she developed cancer and now she tells [place] she is just ready to go "  Pt stays at [facility] " but doesn't involve herself in any of the activities and overall just keeps to her self "  Concern with treatment are side effects that could worsen her mother's current status.  Pt is able to maintain her own care in assisted living - though she does have dementia.  This RN discussed concerns of known cancer, treating with Ibrance -it's side effects on pt's quality of life as well as continuing with faslodex only at present.  [name] is concerned that her mother is continuing to be depressed that is affecting her quality of life.  Plan per end of call is above will be discussed among the family and with pt and at next MD visit above can be discussed with MD for appropriate decisions-   No other questions at this time.

## 2016-05-12 ENCOUNTER — Other Ambulatory Visit: Payer: Medicare Other

## 2016-05-12 ENCOUNTER — Encounter: Payer: Self-pay | Admitting: Internal Medicine

## 2016-05-19 ENCOUNTER — Other Ambulatory Visit: Payer: Medicare Other

## 2016-05-27 ENCOUNTER — Other Ambulatory Visit (HOSPITAL_BASED_OUTPATIENT_CLINIC_OR_DEPARTMENT_OTHER): Payer: Medicare Other

## 2016-05-27 ENCOUNTER — Ambulatory Visit (HOSPITAL_BASED_OUTPATIENT_CLINIC_OR_DEPARTMENT_OTHER): Payer: Medicare Other | Admitting: Oncology

## 2016-05-27 ENCOUNTER — Telehealth: Payer: Self-pay | Admitting: Oncology

## 2016-05-27 ENCOUNTER — Ambulatory Visit (HOSPITAL_BASED_OUTPATIENT_CLINIC_OR_DEPARTMENT_OTHER): Payer: Medicare Other

## 2016-05-27 VITALS — BP 161/62 | HR 99 | Temp 98.0°F | Resp 18 | Ht 60.0 in | Wt 126.3 lb

## 2016-05-27 DIAGNOSIS — Z5111 Encounter for antineoplastic chemotherapy: Secondary | ICD-10-CM | POA: Diagnosis present

## 2016-05-27 DIAGNOSIS — C50812 Malignant neoplasm of overlapping sites of left female breast: Secondary | ICD-10-CM

## 2016-05-27 DIAGNOSIS — C7951 Secondary malignant neoplasm of bone: Secondary | ICD-10-CM

## 2016-05-27 DIAGNOSIS — C773 Secondary and unspecified malignant neoplasm of axilla and upper limb lymph nodes: Secondary | ICD-10-CM

## 2016-05-27 DIAGNOSIS — C50412 Malignant neoplasm of upper-outer quadrant of left female breast: Secondary | ICD-10-CM

## 2016-05-27 DIAGNOSIS — C50811 Malignant neoplasm of overlapping sites of right female breast: Secondary | ICD-10-CM | POA: Diagnosis not present

## 2016-05-27 DIAGNOSIS — C7952 Secondary malignant neoplasm of bone marrow: Secondary | ICD-10-CM

## 2016-05-27 DIAGNOSIS — C50912 Malignant neoplasm of unspecified site of left female breast: Secondary | ICD-10-CM

## 2016-05-27 LAB — CBC WITH DIFFERENTIAL/PLATELET
BASO%: 0.4 % (ref 0.0–2.0)
BASOS ABS: 0 10*3/uL (ref 0.0–0.1)
EOS ABS: 0.2 10*3/uL (ref 0.0–0.5)
EOS%: 2.3 % (ref 0.0–7.0)
HEMATOCRIT: 39.8 % (ref 34.8–46.6)
HEMOGLOBIN: 13 g/dL (ref 11.6–15.9)
LYMPH#: 0.9 10*3/uL (ref 0.9–3.3)
LYMPH%: 10.8 % — ABNORMAL LOW (ref 14.0–49.7)
MCH: 29.9 pg (ref 25.1–34.0)
MCHC: 32.7 g/dL (ref 31.5–36.0)
MCV: 91.6 fL (ref 79.5–101.0)
MONO#: 0.9 10*3/uL (ref 0.1–0.9)
MONO%: 12 % (ref 0.0–14.0)
NEUT#: 5.9 10*3/uL (ref 1.5–6.5)
NEUT%: 74.5 % (ref 38.4–76.8)
PLATELETS: 237 10*3/uL (ref 145–400)
RBC: 4.35 10*6/uL (ref 3.70–5.45)
RDW: 13.7 % (ref 11.2–14.5)
WBC: 7.9 10*3/uL (ref 3.9–10.3)

## 2016-05-27 LAB — COMPREHENSIVE METABOLIC PANEL
ALBUMIN: 3.3 g/dL — AB (ref 3.5–5.0)
ALT: 21 U/L (ref 0–55)
ANION GAP: 7 meq/L (ref 3–11)
AST: 26 U/L (ref 5–34)
Alkaline Phosphatase: 101 U/L (ref 40–150)
BILIRUBIN TOTAL: 0.67 mg/dL (ref 0.20–1.20)
BUN: 12.9 mg/dL (ref 7.0–26.0)
CALCIUM: 9.5 mg/dL (ref 8.4–10.4)
CHLORIDE: 103 meq/L (ref 98–109)
CO2: 28 meq/L (ref 22–29)
CREATININE: 0.8 mg/dL (ref 0.6–1.1)
EGFR: 66 mL/min/{1.73_m2} — ABNORMAL LOW (ref >90)
GLUCOSE: 117 mg/dL (ref 70–140)
Potassium: 4.3 mEq/L (ref 3.5–5.1)
Sodium: 138 mEq/L (ref 136–145)
Total Protein: 6.7 g/dL (ref 6.4–8.3)

## 2016-05-27 MED ORDER — FULVESTRANT 250 MG/5ML IM SOLN
500.0000 mg | Freq: Once | INTRAMUSCULAR | Status: AC
  Administered 2016-05-27: 500 mg via INTRAMUSCULAR
  Filled 2016-05-27: qty 10

## 2016-05-27 MED ORDER — TRAMADOL HCL 50 MG PO TABS: 50.0000 mg | ORAL_TABLET | Freq: Four times a day (QID) | ORAL | 3 refills | Status: DC | PRN

## 2016-05-27 NOTE — Progress Notes (Signed)
[place]  Telephone:(336) [phone] Fax:(336) [phone]   ID: Lauren Buchanan DOB: 19-Mar-1925  MR#: [id number]  YDX#:[id number]  Patient Care Team: Dorena Cookey, MD as PCP - General (Family Medicine) Fanny Skates, MD as Consulting Physician (General Surgery) Chauncey Cruel, MD as Consulting Physician (Oncology) [name], MD as Consulting Physician (Radiation Oncology) Rockwell Germany, RN as Registered Nurse Mauro Kaufmann, RN as Registered Nurse PCP: Joycelyn Man, MD GYN: OTHER MD: Danton Sewer MD, Thompson Grayer MD, Unice Bailey MD, Rolm Bookbinder MD  CHIEF COMPLAINT: bilateral estrogen receptor positive breast cancer, metastatic to bone  CURRENT TREATMENT: fulvestrant, zolendronate  NOTE: Patient requests her daughter Lauren Buchanan be contacted regarding all appointments and procedures. Lauren Buchanan can be reached at [place]: From the original intake note:  "Lauren Buchanan"  Has been aware of a sore right nipple for the last 6-8 months. She attributed it to her washing machine, which rubs that area when she uses it. On  11/21/2014 she saw Dr. 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 She mentioned the chest wall problem and Dr. Ubaldo Glassing obtained to skin biopsies 1 from the right inferior central chest and the other one of from the right mid medial chest.  The second of these showed a nodular basal cell carcinoma. The first one however showed invasive carcinoma consistent with a breast primary. This tumor was estrogen receptor positive at 100%, progesterone receptor positive at 36%, both with strong staining intensity, and HER-2 was amplified , the signals ratio being 6.2. Lauren Buchanan [phone])  On 12/06/2014 the patient underwent bilateral diagnostic mammography with bilateral ultrasonography. Breast density was category B. There was bilateral nipple retraction. In the left breast upper outer quadrant there was a spiculated mass noted by  mammography with a second spiculated mass in the lower inner quadrant. By ultrasound there was a 1.6 cm hypoechoic lesion at the 1:30 o'clock position in the left breast, and a second mass more medially measuring 0.9 cm. Also at the 8:00 position in the left breast 2 cm from the nipple there was a 1.3 cm mass. Ultrasound of the left axilla showed an enlarged lymph node with fatty hilum replacement, measuring 1.4 cm.  In the right breast mammography showed a cluster of indeterminate microcalcifications in the upper outer quadrant and a retroareolar mass, which bile does sound measured 2.9 cm. Ultrasound of the right axilla showed a right axilla lymph node measuring 1.4 cm.  On 12/06/2014 the patient underwent needle core biopsy of 2 separate masses in the left breast (at 1:00 and 8:00) as well as the enlarged axillary lymph node. All 3 were positive for an invasive lobular carcinoma (E-cadherin negative). All 3 tumors were estrogen receptor positive with strong staining intensity (between 87% and 100%). The lymph node was progesterone receptor negative, the other 2 masses being progesterone receptor positive at 9% and at 76%. This cancer was reported to be HER-2 positive at conference today, though I do not have the ratios at hand.  The patient is not an MRI candidate because she has a permanent pacemaker in place. Her subsequent history is as detailed below   INTERVAL HISTORY: Lauren Buchanan returns today for follow-up of her metastatic estrogen receptor positive breast cancer, accompanied by her daughter.  Lauren Buchanan continues on fulvestrant every 4 weeks, with dose due today.  At the last visit we discussed adding palbociclib and I did put in the prescription, but she never had it filled.  She decided she did not want to try it.  REVIEW OF SYSTEMS: Lauren Buchanan continues to do remarkably well and 47 didn't know I had cancer I wouldn't know I had it.". She says her appetite is poor although food tastes well. She has rheumatoid  arthritis of course and she takes tramadol and prednisone for that. She has some bruises in her lower legs she wanted me to look at today and some lesions on her abdomen she also wanted me to look at otherwise a detailed review of systems today was stable  PAST MEDICAL HISTORY: Past Medical History:  Diagnosis Date  . Asthma   . Breast cancer ([place]) 12/08/2014   Bilateral  . Cancer of central portion of female breast ([place]) 12/04/2014  . Cancer of central portion of female breast ([place]) 12/04/2014  . Chest pain   . Complete heart block ([place]) 1999   s/p PPM by Dr Olevia Perches, most recent generator change 2009  . COPD (chronic obstructive pulmonary disease) ([place])   . Diverticulosis   . DJD (degenerative joint disease)   . Esophageal stricture   . GERD (gastroesophageal reflux disease)   . Giant cell arteritis ([place])   . Goiter   . Headache(784.0)   . Heart murmur   . Hemorrhoids   . Hiatal hernia   . History of general anesthesia complication   . IBS (irritable bowel syndrome)   . Pacemaker   . Pneumonia   . Renal cyst   . S/P radiation therapy 04/24/15 completed   T-11  . Wears glasses     PAST SURGICAL HISTORY: Past Surgical History:  Procedure Laterality Date  . ANAL FISSURE REPAIR    . ARTERY BIOPSY Left 01/03/2013   Procedure: BIOPSY LEFT TEMPORAL ARTERY;  Surgeon: Ascencion Dike, MD;  Location: [facility];  Service: ENT;  Laterality: Left;  . BREAST BIOPSY Right   . CHOLECYSTECTOMY    . COLONOSCOPY    . FRACTURE SURGERY     fx lt wrist  . hemorrhoidectomy    . left lower lobectomy for brochiectasis  1950  . MOUTH SURGERY    . PACEMAKER INSERTION  1999   Gen change (SJM) by Dr Olevia Perches 2009  . RECTAL POLYPECTOMY    . RENAL CYST EXCISION    . TONSILLECTOMY      FAMILY HISTORY Family History  Problem Relation Age of Onset  . Heart disease Mother   . Breast cancer Mother   . Stomach cancer Father   . Seizures Son   . Thyroid disease Sister   . Alcohol abuse  Father    the patient's father died at the age of 59, with stomach cancer. The patient's mother died at the age of 56 from a myocardial infarction. She had been diagnosed with breast cancer in her 69s. The patient had no brothers, 2 sisters. There is no other history of breast or ovarian cancer in the family to the patient's knowledge  GYNECOLOGIC HISTORY:  No LMP recorded. Patient is postmenopausal. Menarche age 44, first live birth age 26. The patient is GX P4. She underwent menopause in her late 60s. She did not take hormone replacement.  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 She is a widow, lives by herself, with no pets. Daughter Wendy Poet lives in [place] where she is Mudlogger of a preschool and day care. Son Jameeka Marcy lives in [place] where he owns a furniture to sign  store. Daughter Shelly Rubenstein lives in [place]. She owns a Chiropractor. Son Randall Hiss is physically and mentally disabled and lives in a group home in [place]. The patient has 7 grandchildren and 2 great-grandchildren. She attends a local [place]: [employer]; the patient's daughter Lauren Buchanan is her healthcare power of attorney. She can be reached at [place]: Social History  Substance Use Topics  . Smoking status: Never Smoker  . Smokeless tobacco: Never Used  . Alcohol use Not on file     Comment: Occasional      Colonoscopy:  PAP:  Bone density:02/19/2015, osteoporosis  Lipid panel:  Allergies  Allergen Reactions  . Morphine And Related Nausea Only  . Amoxicillin-Pot Clavulanate Other (See Comments)    Unknown     Current Outpatient Prescriptions  Medication Sig Dispense Refill  . Calcium Citrate-Vitamin D (CITRACAL PETITES/VITAMIN D) 200-250 MG-UNIT TABS Take 1 tablet by mouth daily.      . metoprolol succinate (TOPROL-XL) 100 MG 24 hr tablet TAKE 1/2 (ONE-HALF) TABLET BY MOUTH EVERY MORNING 30 tablet 4  .  naproxen sodium (ANAPROX) 220 MG tablet Take 220 mg by mouth 2 (two) times daily with a meal.     . predniSONE (DELTASONE) 5 MG tablet Take 5 mg by mouth daily with breakfast. Reported on 11/19/2015    . Probiotic Product (PROBIOTIC DAILY PO) Take 1 tablet by mouth daily. 1 Chew daily.    . traMADol (ULTRAM) 50 MG tablet Take 1 tablet (50 mg total) by mouth every 6 (six) hours as needed for moderate pain or severe pain. Reported on 11/19/2015 30 tablet 3  . TURMERIC PO Take 1 capsule by mouth daily.    . vitamin C (ASCORBIC ACID) 500 MG tablet Take 500 mg by mouth daily.       No current facility-administered medications for this visit.     OBJECTIVE: Elderly white woman In no acute distress Vitals:   05/27/16 0938  BP: (!) 161/62  Pulse: 99  Resp: 18  Temp: 98 F (36.7 C)     Body mass index is 24.67 kg/m.    ECOG FS:1 - Symptomatic but completely ambulatory  Sclerae unicteric, pupils round and equal Oropharynx clear and moist-- no thrush or other lesions No cervical or supraclavicular adenopathy Lungs no rales or rhonchi Heart regular rate and rhythm Abd soft, nontender, positive bowel sounds MSK no focal spinal tenderness, no upper extremity lymphedema Neuro: nonfocal, well oriented, appropriate affect Breasts: There continues to be some induration around the right nipple. The right axilla is benign. The left breast is lumpy but otherwise unremarkable and the left axilla is benign.  LAB RESULTS:  CMP     Component Value Date/Time   NA 138 05/27/2016 0926   K 4.3 05/27/2016 0926   CL 104 03/07/2015 0925   CO2 28 05/27/2016 0926   GLUCOSE 117 05/27/2016 0926   BUN 12.9 05/27/2016 0926   CREATININE 0.8 05/27/2016 0926   CALCIUM 9.5 05/27/2016 0926   PROT 6.7 05/27/2016 0926   ALBUMIN 3.3 (L) 05/27/2016 0926   AST 26 05/27/2016 0926   ALT 21 05/27/2016 0926   ALKPHOS 101 05/27/2016 0926   BILITOT 0.67 05/27/2016 0926   GFRNONAA >60 03/07/2015 0925   GFRAA >60  03/07/2015 0925    INo results found for: SPEP, UPEP  Lab Results  Component Value Date   WBC 7.9 05/27/2016   NEUTROABS 5.9 05/27/2016  HGB 13.0 05/27/2016   HCT 39.8 05/27/2016   MCV 91.6 05/27/2016   PLT 237 05/27/2016      Chemistry      Component Value Date/Time   NA 138 05/27/2016 0926   K 4.3 05/27/2016 0926   CL 104 03/07/2015 0925   CO2 28 05/27/2016 0926   BUN 12.9 05/27/2016 0926   CREATININE 0.8 05/27/2016 0926      Component Value Date/Time   CALCIUM 9.5 05/27/2016 0926   ALKPHOS 101 05/27/2016 0926   AST 26 05/27/2016 0926   ALT 21 05/27/2016 0926   BILITOT 0.67 05/27/2016 0926       Lab Results  Component Value Date   LABCA2 20 04/01/2016    No components found for: [id number]  No results for input(s): INR in the last 168 hours.  Urinalysis    Component Value Date/Time   COLORURINE YELLOW 09/27/2014 1800   APPEARANCEUR CLEAR 09/27/2014 1800   LABSPEC 1.005 09/27/2014 1800   PHURINE 7.0 09/27/2014 1800   GLUCOSEU NEGATIVE 09/27/2014 1800   HGBUR NEGATIVE 09/27/2014 1800   HGBUR trace-intact 01/17/2010 0932   BILIRUBINUR NEGATIVE 09/27/2014 1800   BILIRUBINUR n 09/01/2011 1157   KETONESUR NEGATIVE 09/27/2014 1800   PROTEINUR NEGATIVE 09/27/2014 1800   UROBILINOGEN 0.2 09/27/2014 1800   NITRITE NEGATIVE 09/27/2014 1800   LEUKOCYTESUR TRACE (A) 09/27/2014 1800    STUDIES: No results found.  ASSESSMENT: 82 y.o. [place] woman with bilateral breast cancer, as follows:  (1) status post right breastt overlapping sites skin biopsy 11/21/2014 for a clinical T4 N1, stage IIIB invasive ductal carcinoma, estrogen and progesterone receptor positive, HER-2 amplified  (2) status post 2 separate left breast upper outer quadrant biopsies and one left axillary lymph node biopsy 12/04/2014, all positive for a clinical mT1c N1, stage IIA invasive lobular carcinoma, estrogen receptor positive, progesterone receptor variable, with MIB-1 between 10 and  40%, and no HER-2 amplification  (3) neoadjuvant anastrozole started 12/14/2014. Discontinued with progression by repeat left breast US May 2017  (4) definitive surgery to consist of bilateral mastectomies without formal axillary dissection: Postponed  METASTATIC DISEASE: March 2016 (5) PET scan 12/19/2014 shows in addition to the bilateral breast and bilateral axillary masses, a lesion at the T11 vertebral body, showing an irregular lucency on prior CT exam  (a) adjuvant radiationt to T10-T12: 04/11/2015 through 04/24/2015 to a total dose of 30 gray in 10 fractions  (6) consider anti-HER-2 treatment depending on response to antiestrogen therapy alone.    (7) osteoporosis with T score of -2.6 on bone scan obtained 02/19/2015.  (a) zolendronate started 03/20/2015, repeated every 12 weeks   (8) fulvestrant started 03/04/16  PLAN: Lauren Buchanan is doing fine on the fulvestrant, with excellent tolerance. Clinically she is doing very well.   She opted against palbociclib. We will hold that "in our back pocket" to use at a time when we documented disease progression.  She will have her next shot the first week in September and then the first week in October. She will see me with the October treatment. A week before that she will have bilateral ultrasounds of the breast and axilla to follow her measurable disease.  She knows to call for any problems that may develop before then. Chauncey Cruel, MD   05/27/2016 10:27 AM

## 2016-05-27 NOTE — Telephone Encounter (Signed)
Gave pt cal & avs °

## 2016-05-27 NOTE — Patient Instructions (Signed)
Fulvestrant injection What is this medicine? FULVESTRANT (ful VES trant) blocks the effects of estrogen. It is used to treat breast cancer. This medicine may be used for other purposes; ask your health care provider or pharmacist if you have questions. What should I tell my health care provider before I take this medicine? They need to know if you have any of these conditions: -bleeding problems -liver disease -low levels of platelets in the blood -an unusual or allergic reaction to fulvestrant, other medicines, foods, dyes, or preservatives -pregnant or trying to get pregnant -breast-feeding How should I use this medicine? This medicine is for injection into a muscle. It is usually given by a health care professional in a hospital or clinic setting. Talk to your pediatrician regarding the use of this medicine in children. Special care may be needed. Overdosage: If you think you have taken too much of this medicine contact a poison control center or emergency room at once. NOTE: This medicine is only for you. Do not share this medicine with others. What if I miss a dose? It is important not to miss your dose. Call your doctor or health care professional if you are unable to keep an appointment. What may interact with this medicine? -medicines that treat or prevent blood clots like warfarin, enoxaparin, and dalteparin This list may not describe all possible interactions. Give your health care provider a list of all the medicines, herbs, non-prescription drugs, or dietary supplements you use. Also tell them if you smoke, drink alcohol, or use illegal drugs. Some items may interact with your medicine. What should I watch for while using this medicine? Your condition will be monitored carefully while you are receiving this medicine. You will need important blood work done while you are taking this medicine. Do not become pregnant while taking this medicine or for at least 1 year after stopping  it. Women of child-bearing potential will need to have a negative pregnancy test before starting this medicine. Women should inform their doctor if they wish to become pregnant or think they might be pregnant. There is a potential for serious side effects to an unborn child. Men should inform their doctors if they wish to father a child. This medicine may lower sperm counts. Talk to your health care professional or pharmacist for more information. Do not breast-feed an infant while taking this medicine or for 1 year after the last dose. What side effects may I notice from receiving this medicine? Side effects that you should report to your doctor or health care professional as soon as possible: -allergic reactions like skin rash, itching or hives, swelling of the face, lips, or tongue -feeling faint or lightheaded, falls -pain, tingling, numbness, or weakness in the legs -signs and symptoms of infection like fever or chills; cough; flu-like symptoms; sore throat -vaginal bleeding Side effects that usually do not require medical attention (report to your doctor or health care professional if they continue or are bothersome): -aches, pains -constipation -diarrhea -headache -hot flashes -nausea, vomiting -pain at site where injected -stomach pain This list may not describe all possible side effects. Call your doctor for medical advice about side effects. You may report side effects to FDA at 1-800-FDA-1088. Where should I keep my medicine? This drug is given in a hospital or clinic and will not be stored at home. NOTE: This sheet is a summary. It may not cover all possible information. If you have questions about this medicine, talk to your doctor, pharmacist, or health  care provider.    2016, [employer]. (2015-05-03 11:03:55)    Zoledronic Acid injection (Hypercalcemia, Oncology) What is this medicine? ZOLEDRONIC ACID (ZOE le dron ik AS id) lowers the amount of calcium loss  from bone. It is used to treat too much calcium in your blood from cancer. It is also used to prevent complications of cancer that has spread to the bone. This medicine may be used for other purposes; ask your health care provider or pharmacist if you have questions. What should I tell my health care provider before I take this medicine? They need to know if you have any of these conditions: -aspirin-sensitive asthma -cancer, especially if you are receiving medicines used to treat cancer -dental disease or wear dentures -infection -kidney disease -receiving corticosteroids like dexamethasone or prednisone -an unusual or allergic reaction to zoledronic acid, other medicines, foods, dyes, or preservatives -pregnant or trying to get pregnant -breast-feeding How should I use this medicine? This medicine is for infusion into a vein. It is given by a health care professional in a hospital or clinic setting. Talk to your pediatrician regarding the use of this medicine in children. Special care may be needed. Overdosage: If you think you have taken too much of this medicine contact a poison control center or emergency room at once. NOTE: This medicine is only for you. Do not share this medicine with others. What if I miss a dose? It is important not to miss your dose. Call your doctor or health care professional if you are unable to keep an appointment. What may interact with this medicine? -certain antibiotics given by injection -NSAIDs, medicines for pain and inflammation, like ibuprofen or naproxen -some diuretics like bumetanide, furosemide -teriparatide -thalidomide This list may not describe all possible interactions. Give your health care provider a list of all the medicines, herbs, non-prescription drugs, or dietary supplements you use. Also tell them if you smoke, drink alcohol, or use illegal drugs. Some items may interact with your medicine. What should I watch for while using this  medicine? Visit your doctor or health care professional for regular checkups. It may be some time before you see the benefit from this medicine. Do not stop taking your medicine unless your doctor tells you to. Your doctor may order blood tests or other tests to see how you are doing. Women should inform their doctor if they wish to become pregnant or think they might be pregnant. There is a potential for serious side effects to an unborn child. Talk to your health care professional or pharmacist for more information. You should make sure that you get enough calcium and vitamin D while you are taking this medicine. Discuss the foods you eat and the vitamins you take with your health care professional. Some people who take this medicine have severe bone, joint, and/or muscle pain. This medicine may also increase your risk for jaw problems or a broken thigh bone. Tell your doctor right away if you have severe pain in your jaw, bones, joints, or muscles. Tell your doctor if you have any pain that does not go away or that gets worse. Tell your dentist and dental surgeon that you are taking this medicine. You should not have major dental surgery while on this medicine. See your dentist to have a dental exam and fix any dental problems before starting this medicine. Take good care of your teeth while on this medicine. Make sure you see your dentist for regular follow-up appointments. What side effects  may I notice from receiving this medicine? Side effects that you should report to your doctor or health care professional as soon as possible: -allergic reactions like skin rash, itching or hives, swelling of the face, lips, or tongue -anxiety, confusion, or depression -breathing problems -changes in vision -eye pain -feeling faint or lightheaded, falls -jaw pain, especially after dental work -mouth sores -muscle cramps, stiffness, or weakness -redness, blistering, peeling or loosening of the skin, including  inside the mouth -trouble passing urine or change in the amount of urine Side effects that usually do not require medical attention (report to your doctor or health care professional if they continue or are bothersome): -bone, joint, or muscle pain -constipation -diarrhea -fever -hair loss -irritation at site where injected -loss of appetite -nausea, vomiting -stomach upset -trouble sleeping -trouble swallowing -weak or tired This list may not describe all possible side effects. Call your doctor for medical advice about side effects. You may report side effects to FDA at 1-800-FDA-1088. Where should I keep my medicine? This drug is given in a hospital or clinic and will not be stored at home. NOTE: This sheet is a summary. It may not cover all possible information. If you have questions about this medicine, talk to your doctor, pharmacist, or health care provider.    2016, [employer]. (2014-03-03 14:19:39)

## 2016-05-28 DIAGNOSIS — C50919 Malignant neoplasm of unspecified site of unspecified female breast: Secondary | ICD-10-CM | POA: Diagnosis not present

## 2016-05-28 DIAGNOSIS — M316 Other giant cell arteritis: Secondary | ICD-10-CM | POA: Diagnosis not present

## 2016-05-28 DIAGNOSIS — M810 Age-related osteoporosis without current pathological fracture: Secondary | ICD-10-CM | POA: Diagnosis not present

## 2016-05-28 DIAGNOSIS — M0609 Rheumatoid arthritis without rheumatoid factor, multiple sites: Secondary | ICD-10-CM | POA: Diagnosis not present

## 2016-06-03 ENCOUNTER — Ambulatory Visit: Payer: Medicare Other | Admitting: Pulmonary Disease

## 2016-06-09 ENCOUNTER — Other Ambulatory Visit: Payer: Self-pay | Admitting: *Deleted

## 2016-06-09 ENCOUNTER — Telehealth: Payer: Self-pay

## 2016-06-09 MED ORDER — HYDROCODONE-ACETAMINOPHEN 5-325 MG PO TABS: 1.0000 | ORAL_TABLET | ORAL | 0 refills | Status: DC | PRN

## 2016-06-09 NOTE — Telephone Encounter (Signed)
Daughter called for a percocet refill. Percocet not currently on MAR, rx was written 01/13/16 by Dr Jana Hakim and is in prior medication list. Please call pt when rx ready.

## 2016-06-10 NOTE — Telephone Encounter (Signed)
Prescription obtained and message left on dtrs given number.

## 2016-06-15 ENCOUNTER — Other Ambulatory Visit: Payer: Self-pay | Admitting: *Deleted

## 2016-06-15 DIAGNOSIS — C50412 Malignant neoplasm of upper-outer quadrant of left female breast: Secondary | ICD-10-CM

## 2016-06-15 DIAGNOSIS — M7989 Other specified soft tissue disorders: Secondary | ICD-10-CM

## 2016-06-16 ENCOUNTER — Encounter: Payer: Self-pay | Admitting: Internal Medicine

## 2016-06-24 ENCOUNTER — Other Ambulatory Visit: Payer: Medicare Other

## 2016-06-24 ENCOUNTER — Ambulatory Visit: Payer: Medicare Other

## 2016-07-01 ENCOUNTER — Ambulatory Visit (INDEPENDENT_AMBULATORY_CARE_PROVIDER_SITE_OTHER): Payer: Medicare Other | Admitting: Internal Medicine

## 2016-07-01 ENCOUNTER — Other Ambulatory Visit: Payer: Self-pay

## 2016-07-01 ENCOUNTER — Encounter: Payer: Self-pay | Admitting: Internal Medicine

## 2016-07-01 VITALS — BP 124/64 | HR 70 | Ht 60.0 in | Wt 127.2 lb

## 2016-07-01 DIAGNOSIS — I442 Atrioventricular block, complete: Secondary | ICD-10-CM | POA: Diagnosis not present

## 2016-07-01 MED ORDER — METOPROLOL SUCCINATE ER 100 MG PO TB24: ORAL_TABLET | ORAL | 3 refills | Status: DC

## 2016-07-01 NOTE — Progress Notes (Signed)
Electrophysiology Office Note   Date:  07/01/2016   ID:  Lauren Buchanan, DOB 08/16/1925, MRN [id number]  PCP:  Joycelyn Man, MD   Primary Electrophysiologist: Thompson Grayer, MD    Chief Complaint  Patient presents with  . Follow-up    Complete AV block     History of Present Illness: Lauren Buchanan is a 82 y.o. female who presents today for electrophysiology follow-up.   She has breast cancer for which she has elective to not have surgery or chemotherapy.  She has had some radiation.  She is doing great. She is reasonably active.  She still drives.  She lives at [facility]. Today, she denies symptoms of palpitations, chest pain, shortness of breath, orthopnea, PND, lower extremity edema, claudication, dizziness, presyncope, syncope, bleeding, or neurologic sequela. The patient is tolerating medications without difficulties and is otherwise without complaint today.    Past Medical History:  Diagnosis Date  . Asthma   . Breast cancer ([facility]) 12/08/2014   Bilateral  . Cancer of central portion of female breast ([place]) 12/04/2014  . Cancer of central portion of female breast ([facility] [place]) 12/04/2014  . Chest pain   . Complete heart block ([place]) 1999   s/p PPM by Dr Olevia Perches, most recent generator change 2009  . COPD (chronic obstructive pulmonary disease) ([place])   . Diverticulosis   . DJD (degenerative joint disease)   . Esophageal stricture   . GERD (gastroesophageal reflux disease)   . Giant cell arteritis ([place])   . Goiter   . Headache(784.0)   . Heart murmur   . Hemorrhoids   . Hiatal hernia   . History of general anesthesia complication   . IBS (irritable bowel syndrome)   . Pacemaker   . Pneumonia   . Renal cyst   . S/P radiation therapy 04/24/15 completed   T-11  . Wears glasses    Past Surgical History:  Procedure Laterality Date  . ANAL FISSURE REPAIR    . ARTERY BIOPSY Left 01/03/2013   Procedure: BIOPSY LEFT TEMPORAL ARTERY;  Surgeon: Ascencion Dike, MD;  Location:  [facility];  Service: ENT;  Laterality: Left;  . BREAST BIOPSY Right   . CHOLECYSTECTOMY    . COLONOSCOPY    . FRACTURE SURGERY     fx lt wrist  . hemorrhoidectomy    . left lower lobectomy for brochiectasis  1950  . MOUTH SURGERY    . PACEMAKER INSERTION  1999   Gen change (SJM) by Dr Olevia Perches 2009  . RECTAL POLYPECTOMY    . RENAL CYST EXCISION    . TONSILLECTOMY       Current Outpatient Prescriptions  Medication Sig Dispense Refill  . Calcium Citrate-Vitamin D (CITRACAL PETITES/VITAMIN D) 200-250 MG-UNIT TABS Take 1 tablet by mouth daily.      Marland Kitchen HYDROcodone-acetaminophen (NORCO/VICODIN) 5-325 MG tablet Take 1 tablet by mouth every 4 (four) hours as needed. 30 tablet 0  . metoprolol succinate (TOPROL-XL) 100 MG 24 hr tablet TAKE 1/2 (ONE-HALF) TABLET BY MOUTH EVERY MORNING. Take with or immediately following a meal.    . naproxen sodium (ANAPROX) 220 MG tablet Take 220 mg by mouth 2 (two) times daily with a meal.     . predniSONE (DELTASONE) 5 MG tablet Take 5 mg by mouth daily with breakfast. Reported on 11/19/2015    . Probiotic Product (PROBIOTIC DAILY PO) Take 1 tablet by mouth daily. 1 Chew daily.    . traMADol (ULTRAM) 50 MG  tablet Take 1 tablet (50 mg total) by mouth every 6 (six) hours as needed for moderate pain or severe pain. Reported on 11/19/2015 30 tablet 3  . TURMERIC PO Take 1 capsule by mouth daily.    . vitamin C (ASCORBIC ACID) 500 MG tablet Take 500 mg by mouth daily.       No current facility-administered medications for this visit.     Allergies:   Morphine and related and Amoxicillin-pot clavulanate   Social History:  The patient  reports that she has never smoked. She has never used smokeless tobacco. She reports that she does not use drugs.   Family History:  The patient's family history includes Alcohol abuse in her father; Breast cancer in her mother; Heart disease in her mother; Seizures in her son; Stomach cancer in her father; Thyroid  disease in her sister.    ROS:  Please see the history of present illness.   All other systems are reviewed and negative.    PHYSICAL EXAM: VS:  BP 124/64   Pulse 70   Ht 5' (1.524 m)   Wt 127 lb 3.2 oz (57.7 kg)   BMI 24.84 kg/m  , BMI Body mass index is 24.84 kg/m. GEN: Well nourished, well developed, in no acute distress  HEENT: normal  Neck: no JVD, carotid bruits, or masses Cardiac: RRR; no murmurs, rubs, or gallops,no edema  Respiratory:  clear to auscultation bilaterally, normal work of breathing GI: soft, nontender, nondistended, + BS MS: no deformity or atrophy  Skin: warm and dry, device pocket is well healed Neuro:  Strength and sensation are intact Psych: euthymic mood, full affect  Device interrogation is reviewed today in detail.  See PaceArt for details.   Recent Labs: 05/27/2016: ALT 21; BUN 12.9; Creatinine 0.8; HGB 13.0; Platelets 237; Potassium 4.3; Sodium 138    Lipid Panel     Component Value Date/Time   CHOL 194 07/25/2007 0942   TRIG 69 07/25/2007 0942   HDL 62.7 07/25/2007 0942   CHOLHDL 3.1 CALC 07/25/2007 0942   VLDL 14 07/25/2007 0942   LDLCALC 118 (H) 07/25/2007 0942     Wt Readings from Last 3 Encounters:  07/01/16 127 lb 3.2 oz (57.7 kg)  05/27/16 126 lb 4.8 oz (57.3 kg)  04/29/16 123 lb 8 oz (56 kg)      ASSESSMENT AND PLAN:  1.  Complete heart block Device dependant Normal pacemaker function See Pace Art report No changes today  Follow-up: return to the device clinic in 6 months, I will see again in a year  Signed, Thompson Grayer, MD  07/01/2016 3:00 PM     [address] [place] [address] [phone] (office) [phone] (fax)

## 2016-07-01 NOTE — Patient Instructions (Signed)
Medication Instructions:  Your physician recommends that you continue on your current medications as directed. Please refer to the Current Medication list given to you today.  Labwork: None Ordered   Testing/Procedures: None Ordered   Follow-Up: Your physician wants you to follow-up in: 6 months Device Clinic and 1 year with Dr. Rayann Heman. You will receive a reminder letter in the mail two months in advance. If you don't receive a letter, please call our office to schedule the follow-up appointment.   Any Other Special Instructions Will Be Listed Below (If Applicable).     If you need a refill on your cardiac medications before your next appointment, please call your pharmacy.

## 2016-07-15 ENCOUNTER — Ambulatory Visit
Admission: RE | Admit: 2016-07-15 | Discharge: 2016-07-15 | Disposition: A | Discharge status: Home / Self Care | Payer: Medicare Other | Source: Ambulatory Visit | Attending: Oncology | Admitting: Oncology

## 2016-07-15 DIAGNOSIS — C50912 Malignant neoplasm of unspecified site of left female breast: Secondary | ICD-10-CM

## 2016-07-15 DIAGNOSIS — C7951 Secondary malignant neoplasm of bone: Secondary | ICD-10-CM

## 2016-07-15 DIAGNOSIS — C50412 Malignant neoplasm of upper-outer quadrant of left female breast: Secondary | ICD-10-CM

## 2016-07-15 DIAGNOSIS — N63 Unspecified lump in breast: Secondary | ICD-10-CM | POA: Diagnosis not present

## 2016-07-15 DIAGNOSIS — C50811 Malignant neoplasm of overlapping sites of right female breast: Secondary | ICD-10-CM

## 2016-07-21 ENCOUNTER — Other Ambulatory Visit: Payer: Self-pay | Admitting: *Deleted

## 2016-07-21 DIAGNOSIS — C7951 Secondary malignant neoplasm of bone: Principal | ICD-10-CM

## 2016-07-21 DIAGNOSIS — C50919 Malignant neoplasm of unspecified site of unspecified female breast: Secondary | ICD-10-CM

## 2016-07-22 ENCOUNTER — Ambulatory Visit (HOSPITAL_BASED_OUTPATIENT_CLINIC_OR_DEPARTMENT_OTHER): Payer: Medicare Other | Admitting: Oncology

## 2016-07-22 ENCOUNTER — Other Ambulatory Visit (HOSPITAL_BASED_OUTPATIENT_CLINIC_OR_DEPARTMENT_OTHER): Payer: Medicare Other

## 2016-07-22 ENCOUNTER — Ambulatory Visit: Payer: Medicare Other

## 2016-07-22 ENCOUNTER — Ambulatory Visit (HOSPITAL_BASED_OUTPATIENT_CLINIC_OR_DEPARTMENT_OTHER): Payer: Medicare Other

## 2016-07-22 VITALS — BP 166/68 | HR 91 | Temp 97.6°F | Resp 18 | Ht 60.0 in | Wt 125.2 lb

## 2016-07-22 DIAGNOSIS — C50412 Malignant neoplasm of upper-outer quadrant of left female breast: Secondary | ICD-10-CM

## 2016-07-22 DIAGNOSIS — M810 Age-related osteoporosis without current pathological fracture: Secondary | ICD-10-CM

## 2016-07-22 DIAGNOSIS — C50811 Malignant neoplasm of overlapping sites of right female breast: Secondary | ICD-10-CM

## 2016-07-22 DIAGNOSIS — C7951 Secondary malignant neoplasm of bone: Principal | ICD-10-CM

## 2016-07-22 DIAGNOSIS — C773 Secondary and unspecified malignant neoplasm of axilla and upper limb lymph nodes: Secondary | ICD-10-CM | POA: Diagnosis not present

## 2016-07-22 DIAGNOSIS — Z5111 Encounter for antineoplastic chemotherapy: Secondary | ICD-10-CM

## 2016-07-22 DIAGNOSIS — C50912 Malignant neoplasm of unspecified site of left female breast: Secondary | ICD-10-CM

## 2016-07-22 DIAGNOSIS — Z170 Estrogen receptor positive status [ER+]: Principal | ICD-10-CM

## 2016-07-22 DIAGNOSIS — C792 Secondary malignant neoplasm of skin: Secondary | ICD-10-CM | POA: Diagnosis not present

## 2016-07-22 DIAGNOSIS — C50919 Malignant neoplasm of unspecified site of unspecified female breast: Secondary | ICD-10-CM

## 2016-07-22 LAB — COMPREHENSIVE METABOLIC PANEL
ALBUMIN: 3.3 g/dL — AB (ref 3.5–5.0)
ALK PHOS: 86 U/L (ref 40–150)
ALT: 20 U/L (ref 0–55)
AST: 23 U/L (ref 5–34)
Anion Gap: 11 mEq/L (ref 3–11)
BUN: 15.1 mg/dL (ref 7.0–26.0)
CALCIUM: 9.3 mg/dL (ref 8.4–10.4)
CO2: 25 mEq/L (ref 22–29)
CREATININE: 0.9 mg/dL (ref 0.6–1.1)
Chloride: 104 mEq/L (ref 98–109)
EGFR: 58 mL/min/{1.73_m2} — ABNORMAL LOW (ref >90)
GLUCOSE: 136 mg/dL (ref 70–140)
POTASSIUM: 3.7 meq/L (ref 3.5–5.1)
SODIUM: 140 meq/L (ref 136–145)
TOTAL PROTEIN: 6.7 g/dL (ref 6.4–8.3)
Total Bilirubin: 0.98 mg/dL (ref 0.20–1.20)

## 2016-07-22 LAB — CBC WITH DIFFERENTIAL/PLATELET
BASO%: 0.2 % (ref 0.0–2.0)
BASOS ABS: 0 10*3/uL (ref 0.0–0.1)
EOS%: 3.3 % (ref 0.0–7.0)
Eosinophils Absolute: 0.2 10*3/uL (ref 0.0–0.5)
HEMATOCRIT: 41.2 % (ref 34.8–46.6)
HEMOGLOBIN: 13.6 g/dL (ref 11.6–15.9)
LYMPH#: 1 10*3/uL (ref 0.9–3.3)
LYMPH%: 16.2 % (ref 14.0–49.7)
MCH: 30 pg (ref 25.1–34.0)
MCHC: 33 g/dL (ref 31.5–36.0)
MCV: 90.7 fL (ref 79.5–101.0)
MONO#: 1 10*3/uL — ABNORMAL HIGH (ref 0.1–0.9)
MONO%: 14.9 % — AB (ref 0.0–14.0)
NEUT%: 65.4 % (ref 38.4–76.8)
NEUTROS ABS: 4.2 10*3/uL (ref 1.5–6.5)
Platelets: 205 10*3/uL (ref 145–400)
RBC: 4.54 10*6/uL (ref 3.70–5.45)
RDW: 14.1 % (ref 11.2–14.5)
WBC: 6.4 10*3/uL (ref 3.9–10.3)

## 2016-07-22 MED ORDER — ZOLEDRONIC ACID 4 MG/5ML IV CONC
3.3000 mg | Freq: Once | INTRAVENOUS | Status: AC
  Administered 2016-07-22: 3.3 mg via INTRAVENOUS
  Filled 2016-07-22: qty 4.13

## 2016-07-22 MED ORDER — SODIUM CHLORIDE 0.9 % IV SOLN
Freq: Once | INTRAVENOUS | Status: AC
  Administered 2016-07-22: 12:00:00 via INTRAVENOUS

## 2016-07-22 MED ORDER — FULVESTRANT 250 MG/5ML IM SOLN
500.0000 mg | Freq: Once | INTRAMUSCULAR | Status: AC
  Administered 2016-07-22: 500 mg via INTRAMUSCULAR
  Filled 2016-07-22: qty 10

## 2016-07-22 NOTE — Progress Notes (Signed)
Faslodex injection to be given in Infusion Area

## 2016-07-22 NOTE — Patient Instructions (Signed)

## 2016-07-22 NOTE — Progress Notes (Signed)
[facility]  [phone] [phone]   ID: Lauren Buchanan DOB: 06-Apr-1925  MR#: [id number]  JYN#:[id number]  Patient Care Team: Dorena Cookey, MD as PCP - General (Family Medicine) Fanny Skates, MD as Consulting Physician (General Surgery) Chauncey Cruel, MD as Consulting Physician (Oncology) [name], MD as Consulting Physician (Radiation Oncology) Rockwell Germany, RN as Registered Nurse Mauro Kaufmann, RN as Registered Nurse PCP: Lauren Man, MD GYN: OTHER MD: Danton Sewer MD, Thompson Grayer MD, Unice Bailey MD, Rolm Bookbinder MD  CHIEF COMPLAINT: bilateral estrogen receptor positive breast cancer, metastatic to bone  CURRENT TREATMENT: fulvestrant, zolendronate  NOTE: Patient requests her daughter Lauren Buchanan be contacted regarding all appointments and procedures. Lauren Buchanan can be reached at [place]: From the original intake note:  "Lauren Buchanan"  Has been aware of a sore right nipple for the last 6-8 months. She attributed it to her washing machine, which rubs that area when she uses it. On  11/21/2014 she saw Dr. 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 She mentioned the chest wall problem and Dr. Ubaldo Glassing obtained to skin biopsies 1 from the right inferior central chest and the other one of from the right mid medial chest.  The second of these showed a nodular basal cell carcinoma. The first one however showed invasive carcinoma consistent with a breast primary. This tumor was estrogen receptor positive at 100%, progesterone receptor positive at 36%, both with strong staining intensity, and HER-2 was amplified , the signals ratio being 6.2. [name] [id number])  On 12/06/2014 the patient underwent bilateral diagnostic mammography with bilateral ultrasonography. Breast density was category B. There was bilateral nipple retraction. In the left breast upper outer quadrant there was a spiculated mass noted by  mammography with a second spiculated mass in the lower inner quadrant. By ultrasound there was a 1.6 cm hypoechoic lesion at the 1:30 o'clock position in the left breast, and a second mass more medially measuring 0.9 cm. Also at the 8:00 position in the left breast 2 cm from the nipple there was a 1.3 cm mass. Ultrasound of the left axilla showed an enlarged lymph node with fatty hilum replacement, measuring 1.4 cm.  In the right breast mammography showed a cluster of indeterminate microcalcifications in the upper outer quadrant and a retroareolar mass, which bile does sound measured 2.9 cm. Ultrasound of the right axilla showed a right axilla lymph node measuring 1.4 cm.  On 12/06/2014 the patient underwent needle core biopsy of 2 separate masses in the left breast (at 1:00 and 8:00) as well as the enlarged axillary lymph node. All 3 were positive for an invasive lobular carcinoma (E-cadherin negative). All 3 tumors were estrogen receptor positive with strong staining intensity (between 87% and 100%). The lymph node was progesterone receptor negative, the other 2 masses being progesterone receptor positive at 9% and at 76%. This cancer was reported to be HER-2 positive at conference today, though I do not have the ratios at hand.  The patient is not an MRI candidate because she has a permanent pacemaker in place. Her subsequent history is as detailed below   INTERVAL HISTORY: Lauren Buchanan returns today accompanied by her daughter and for follow-up of Lauren Buchanan's metastatic estrogen receptor positive breast cancer. Lauren Buchanan continues on fulvestrant every 4 weeks, with dose due today.  Since her last visit here Lauren Buchanan had a restaging breast ultrasound which shows the measurable disease in the breasts to be  stable.  REVIEW OF SYSTEMS: Lauren Buchanan tolerates the fulvestrant shots with no concerns regarding pain and no other side effects that she is aware of she feels a little bit forgetful and wonders what she can do about that. Overall  she is doing "pretty good". She is not participating [facility] A detailed review of systems today was otherwise stable  PAST MEDICAL HISTORY: Past Medical History:  Diagnosis Date  . Asthma   . Breast cancer ([facility]) 12/08/2014   Bilateral  . Cancer of central portion of female breast ([facility]) 12/04/2014  . Cancer of central portion of female breast ([facility]) 12/04/2014  . Chest pain   . Complete heart block ([facility]) 1999   s/p PPM by Dr Olevia Perches, most recent generator change 2009  . COPD (chronic obstructive pulmonary disease) ([facility])   . Diverticulosis   . DJD (degenerative joint disease)   . Esophageal stricture   . GERD (gastroesophageal reflux disease)   . Giant cell arteritis ([facility])   . Goiter   . Headache(784.0)   . Heart murmur   . Hemorrhoids   . Hiatal hernia   . History of general anesthesia complication   . IBS (irritable bowel syndrome)   . Pacemaker   . Pneumonia   . Renal cyst   . S/P radiation therapy 04/24/15 completed   T-11  . Wears glasses     PAST SURGICAL HISTORY: Past Surgical History:  Procedure Laterality Date  . ANAL FISSURE REPAIR    . ARTERY BIOPSY Left 01/03/2013   Procedure: BIOPSY LEFT TEMPORAL ARTERY;  Surgeon: Ascencion Dike, MD;  Location: [facility];  Service: ENT;  Laterality: Left;  . BREAST BIOPSY Right   . CHOLECYSTECTOMY    . COLONOSCOPY    . FRACTURE SURGERY     fx lt wrist  . hemorrhoidectomy    . left lower lobectomy for brochiectasis  1950  . MOUTH SURGERY    . PACEMAKER INSERTION  1999   Gen change ([facility]) by Dr Olevia Perches 2009  . RECTAL POLYPECTOMY    . RENAL CYST EXCISION    . TONSILLECTOMY      FAMILY HISTORY Family History  Problem Relation Age of Onset  . Heart disease Mother   . Breast cancer Mother   . Stomach cancer Father   . Alcohol abuse Father   . Seizures Son   . Thyroid disease Sister    the patient's father died at the age of 74, with stomach cancer. The patient's mother died at  the age of 3 from a myocardial infarction. She had been diagnosed with breast cancer in her 7s. The patient had no brothers, 2 sisters. There is no other history of breast or ovarian cancer in the family to the patient's knowledge  GYNECOLOGIC HISTORY:  No LMP recorded. Patient is postmenopausal. Menarche age 68, first live birth age 61. The patient is GX P4. She underwent menopause in her late 28s. She did not take hormone replacement.  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 She is a widow, lives by herself, with no pets. Daughter Wendy Poet lives in [place] where she is Mudlogger of a preschool and day care. Son Rashaunda Rahl lives in [place] where he owns a furniture to sign store. Daughter Shelly Rubenstein lives in [place]. She owns a Chiropractor. Son Randall Hiss is physically and mentally disabled and lives in a group home in [place].  The patient has 7 grandchildren and 2 great-grandchildren. She attends a local [place]: [employer]; the patient's daughter Lauren Buchanan is her healthcare power of attorney. She can be reached at [place]: Social History  Substance Use Topics  . Smoking status: Never Smoker  . Smokeless tobacco: Never Used  . Alcohol use Not on file     Comment: Occasional      Colonoscopy:  PAP:  Bone density:02/19/2015, osteoporosis  Lipid panel:  Allergies  Allergen Reactions  . Morphine And Related Nausea Only  . Amoxicillin-Pot Clavulanate Other (See Comments)    Unknown     Current Outpatient Prescriptions  Medication Sig Dispense Refill  . Calcium Citrate-Vitamin D (CITRACAL PETITES/VITAMIN D) 200-250 MG-UNIT TABS Take 1 tablet by mouth daily.      Marland Kitchen HYDROcodone-acetaminophen (NORCO/VICODIN) 5-325 MG tablet Take 1 tablet by mouth every 4 (four) hours as needed. 30 tablet 0  . metoprolol succinate (TOPROL-XL) 100 MG 24 hr tablet TAKE 1/2 (ONE-HALF) TABLET BY MOUTH EVERY  MORNING. Take with or immediately following a meal. 45 tablet 3  . naproxen sodium (ANAPROX) 220 MG tablet Take 220 mg by mouth 2 (two) times daily with a meal.     . predniSONE (DELTASONE) 5 MG tablet Take 5 mg by mouth daily with breakfast. Reported on 11/19/2015    . Probiotic Product (PROBIOTIC DAILY PO) Take 1 tablet by mouth daily. 1 Chew daily.    . traMADol (ULTRAM) 50 MG tablet Take 1 tablet (50 mg total) by mouth every 6 (six) hours as needed for moderate pain or severe pain. Reported on 11/19/2015 30 tablet 3  . TURMERIC PO Take 1 capsule by mouth daily.    . vitamin C (ASCORBIC ACID) 500 MG tablet Take 500 mg by mouth daily.       No current facility-administered medications for this visit.     OBJECTIVE: Elderly white woman Who appears stated age 82:   07/22/16 1006  BP: (!) 166/68  Pulse: 91  Resp: 18  Temp: 97.6 F (36.4 C)     Body mass index is 24.45 kg/m.    ECOG FS:1 - Symptomatic but completely ambulatory  Sclerae unicteric, pupils round and equal Oropharynx clear and moist-- no thrush or other lesions No cervical or supraclavicular adenopathy Lungs no rales or rhonchi Heart regular rate and rhythm Abd soft, nontender, positive bowel sounds MSK no focal spinal tenderness , no upper extremity lymphedema Neuro: nonfocal, well oriented, appropriate affect Breasts: The right nipple is very minimally inverted, with some induration subjacent to it. There is no significant erythema. The right axilla is benign. The left breast is unremarkable  LAB RESULTS:  CMP     Component Value Date/Time   NA 138 05/27/2016 0926   K 4.3 05/27/2016 0926   CL 104 03/07/2015 0925   CO2 28 05/27/2016 0926   GLUCOSE 117 05/27/2016 0926   BUN 12.9 05/27/2016 0926   CREATININE 0.8 05/27/2016 0926   CALCIUM 9.5 05/27/2016 0926   PROT 6.7 05/27/2016 0926   ALBUMIN 3.3 (L) 05/27/2016 0926   AST 26 05/27/2016 0926   ALT 21 05/27/2016 0926   ALKPHOS 101 05/27/2016 0926   BILITOT  0.67 05/27/2016 0926   GFRNONAA >60 03/07/2015 0925   GFRAA >60 03/07/2015 0925    INo results found for: SPEP, UPEP  Lab Results  Component Value Date   WBC 6.4 07/22/2016   NEUTROABS 4.2 07/22/2016  HGB 13.6 07/22/2016   HCT 41.2 07/22/2016   MCV 90.7 07/22/2016   PLT 205 07/22/2016      Chemistry      Component Value Date/Time   NA 138 05/27/2016 0926   K 4.3 05/27/2016 0926   CL 104 03/07/2015 0925   CO2 28 05/27/2016 0926   BUN 12.9 05/27/2016 0926   CREATININE 0.8 05/27/2016 0926      Component Value Date/Time   CALCIUM 9.5 05/27/2016 0926   ALKPHOS 101 05/27/2016 0926   AST 26 05/27/2016 0926   ALT 21 05/27/2016 0926   BILITOT 0.67 05/27/2016 0926       Lab Results  Component Value Date   LABCA2 20 04/01/2016    No components found for: [id number]  No results for input(s): INR in the last 168 hours.  Urinalysis    Component Value Date/Time   COLORURINE YELLOW 09/27/2014 1800   APPEARANCEUR CLEAR 09/27/2014 1800   LABSPEC 1.005 09/27/2014 1800   PHURINE 7.0 09/27/2014 1800   GLUCOSEU NEGATIVE 09/27/2014 1800   HGBUR NEGATIVE 09/27/2014 1800   HGBUR trace-intact 01/17/2010 0932   BILIRUBINUR NEGATIVE 09/27/2014 1800   BILIRUBINUR n 09/01/2011 1157   KETONESUR NEGATIVE 09/27/2014 1800   PROTEINUR NEGATIVE 09/27/2014 1800   UROBILINOGEN 0.2 09/27/2014 1800   NITRITE NEGATIVE 09/27/2014 1800   LEUKOCYTESUR TRACE (A) 09/27/2014 1800    STUDIES: US Breast Ltd Uni Left Inc Axilla  Result Date: 07/15/2016 CLINICAL DATA:  82 year old female -followup known left breast cancer and right breast mass. EXAM: ULTRASOUND OF THE BILATERAL BREAST COMPARISON:  02/20/2016 ultrasound and prior exams FINDINGS: Targeted ultrasound is performed, showing unchanged irregular hypoechoic masses within both breasts as described: A 1.7 x 1.6 x 2.4 cm retroareolar right breast mass. Increase in measurements since the prior study are due to technical differences. A 0.6 x 0.5  x 0.7 cm mass at the 8 o'clock position of the left breast 2 cm from the nipple. A 1.2 x 1 x 1.5 cm mass at the 1:30 position of the left breast 4 cm from the nipple. A 0.4 x 0.2 x 0.2 cm mass at the 1:30 position of the left breast 4 cm from the nipple. IMPRESSION: No significant change in bilateral breast masses/left breast malignancy since 02/20/2016. RECOMMENDATION: Treatment plan I have discussed the findings and recommendations with the patient. Results were also provided in writing at the conclusion of the visit. If applicable, a reminder letter will be sent to the patient regarding the next appointment. BI-RADS CATEGORY  6: Known biopsy-proven malignancy - appropriate action should be taken. Electronically Signed   By: Margarette Canada M.D.   On: 07/15/2016 12:01   US Breast Ltd Uni Right Inc Axilla  Result Date: 07/15/2016 CLINICAL DATA:  82 year old female -followup known left breast cancer and right breast mass. EXAM: ULTRASOUND OF THE BILATERAL BREAST COMPARISON:  02/20/2016 ultrasound and prior exams FINDINGS: Targeted ultrasound is performed, showing unchanged irregular hypoechoic masses within both breasts as described: A 1.7 x 1.6 x 2.4 cm retroareolar right breast mass. Increase in measurements since the prior study are due to technical differences. A 0.6 x 0.5 x 0.7 cm mass at the 8 o'clock position of the left breast 2 cm from the nipple. A 1.2 x 1 x 1.5 cm mass at the 1:30 position of the left breast 4 cm from the nipple. A 0.4 x 0.2 x 0.2 cm mass at the 1:30 position of the left breast 4 cm from the  nipple. IMPRESSION: No significant change in bilateral breast masses/left breast malignancy since 02/20/2016. RECOMMENDATION: Treatment plan I have discussed the findings and recommendations with the patient. Results were also provided in writing at the conclusion of the visit. If applicable, a reminder letter will be sent to the patient regarding the next appointment. BI-RADS CATEGORY  6: Known  biopsy-proven malignancy - appropriate action should be taken. Electronically Signed   By: Margarette Canada M.D.   On: 07/15/2016 12:01    ASSESSMENT: 82 y.o. [place] woman with bilateral breast cancer, as follows:  (1) status post right breastt overlapping sites skin biopsy 11/21/2014 for a clinical T4 N1, stage IIIB invasive ductal carcinoma, estrogen and progesterone receptor positive, HER-2 amplified  (2) status post 2 separate left breast upper outer quadrant biopsies and one left axillary lymph node biopsy 12/04/2014, all positive for a clinical mT1c N1, stage IIA invasive lobular carcinoma, estrogen receptor positive, progesterone receptor variable, with MIB-1 between 10 and 40%, and no HER-2 amplification  (3) neoadjuvant anastrozole started 12/14/2014. Discontinued with progression by repeat left breast US May 2017  (4) definitive surgery to consist of bilateral mastectomies without formal axillary dissection: Postponed  METASTATIC DISEASE: March 2016 (5) PET scan 12/19/2014 shows in addition to the bilateral breast and bilateral axillary masses, a lesion at the T11 vertebral body, showing an irregular lucency on prior CT exam  (a) adjuvant radiationt to T10-T12: 04/11/2015 through 04/24/2015 to a total dose of 30 gray in 10 fractions  (6) consider anti-HER-2 treatment depending on response to antiestrogen therapy alone.    (7) osteoporosis with T score of -2.6 on bone scan obtained 02/19/2015.  (a) zolendronate started 03/20/2015, repeated every 12 weeks   (8) fulvestrant started 03/04/16  (a) patient refused adding palbociclib  PLAN: Lauren Buchanan is tolerating her fulvestrant well we just obtained restaging ultrasound of the breast and the breast lesions are there but they are not any bigger. We are doing the very minimum to achieve control and she is comfortable with that.  She understands we could be adding palbociclib or trastuzumab to her treatment and that very likely she would  tolerate those well. At this point however she is not motivated to do that.  I have strongly urged her to participate in an exercise program. These are readily available to her. She is thinking about it.  We are continuing fulvestrant every 28 days and zolendronate every 12 weeks. She will receive both today. She will see me December 29 on the day before that she will have a repeat PET scan. At that time we will decide whether any further treatment intensification is warranted or whether we can simply continue the fulvestrant  She knows to call for any problems that may develop before her next visit here.    Chauncey Cruel, MD   07/22/2016 10:36 AM

## 2016-07-30 DIAGNOSIS — Z23 Encounter for immunization: Secondary | ICD-10-CM | POA: Diagnosis not present

## 2016-08-19 ENCOUNTER — Other Ambulatory Visit (HOSPITAL_BASED_OUTPATIENT_CLINIC_OR_DEPARTMENT_OTHER): Payer: Medicare Other

## 2016-08-19 ENCOUNTER — Ambulatory Visit (HOSPITAL_BASED_OUTPATIENT_CLINIC_OR_DEPARTMENT_OTHER): Payer: Medicare Other

## 2016-08-19 VITALS — BP 151/58 | HR 82 | Temp 97.8°F | Resp 20

## 2016-08-19 DIAGNOSIS — C773 Secondary and unspecified malignant neoplasm of axilla and upper limb lymph nodes: Secondary | ICD-10-CM

## 2016-08-19 DIAGNOSIS — Z5111 Encounter for antineoplastic chemotherapy: Secondary | ICD-10-CM | POA: Diagnosis present

## 2016-08-19 DIAGNOSIS — C50912 Malignant neoplasm of unspecified site of left female breast: Secondary | ICD-10-CM

## 2016-08-19 DIAGNOSIS — Z170 Estrogen receptor positive status [ER+]: Principal | ICD-10-CM

## 2016-08-19 DIAGNOSIS — C50412 Malignant neoplasm of upper-outer quadrant of left female breast: Secondary | ICD-10-CM

## 2016-08-19 DIAGNOSIS — C7951 Secondary malignant neoplasm of bone: Secondary | ICD-10-CM

## 2016-08-19 DIAGNOSIS — C50811 Malignant neoplasm of overlapping sites of right female breast: Secondary | ICD-10-CM

## 2016-08-19 LAB — CBC WITH DIFFERENTIAL/PLATELET
BASO%: 0.6 % (ref 0.0–2.0)
BASOS ABS: 0 10*3/uL (ref 0.0–0.1)
EOS ABS: 0.2 10*3/uL (ref 0.0–0.5)
EOS%: 3.6 % (ref 0.0–7.0)
HEMATOCRIT: 39.7 % (ref 34.8–46.6)
HGB: 12.8 g/dL (ref 11.6–15.9)
LYMPH#: 0.8 10*3/uL — AB (ref 0.9–3.3)
LYMPH%: 14.9 % (ref 14.0–49.7)
MCH: 29.4 pg (ref 25.1–34.0)
MCHC: 32.4 g/dL (ref 31.5–36.0)
MCV: 90.7 fL (ref 79.5–101.0)
MONO#: 0.6 10*3/uL (ref 0.1–0.9)
MONO%: 12.5 % (ref 0.0–14.0)
NEUT#: 3.5 10*3/uL (ref 1.5–6.5)
NEUT%: 68.4 % (ref 38.4–76.8)
PLATELETS: 222 10*3/uL (ref 145–400)
RBC: 4.37 10*6/uL (ref 3.70–5.45)
RDW: 14.6 % — ABNORMAL HIGH (ref 11.2–14.5)
WBC: 5.1 10*3/uL (ref 3.9–10.3)

## 2016-08-19 LAB — COMPREHENSIVE METABOLIC PANEL
ALT: 18 U/L (ref 0–55)
ANION GAP: 6 meq/L (ref 3–11)
AST: 23 U/L (ref 5–34)
Albumin: 3.2 g/dL — ABNORMAL LOW (ref 3.5–5.0)
Alkaline Phosphatase: 91 U/L (ref 40–150)
BUN: 13.8 mg/dL (ref 7.0–26.0)
CALCIUM: 9.1 mg/dL (ref 8.4–10.4)
CHLORIDE: 105 meq/L (ref 98–109)
CO2: 29 mEq/L (ref 22–29)
CREATININE: 0.8 mg/dL (ref 0.6–1.1)
EGFR: 62 mL/min/{1.73_m2} — ABNORMAL LOW (ref >90)
Glucose: 153 mg/dl — ABNORMAL HIGH (ref 70–140)
Potassium: 3.9 mEq/L (ref 3.5–5.1)
Sodium: 140 mEq/L (ref 136–145)
Total Bilirubin: 0.81 mg/dL (ref 0.20–1.20)
Total Protein: 6.4 g/dL (ref 6.4–8.3)

## 2016-08-19 MED ORDER — FULVESTRANT 250 MG/5ML IM SOLN
500.0000 mg | Freq: Once | INTRAMUSCULAR | Status: AC
  Administered 2016-08-19: 500 mg via INTRAMUSCULAR
  Filled 2016-08-19: qty 10

## 2016-08-19 NOTE — Patient Instructions (Signed)
Fulvestrant injection What is this medicine? FULVESTRANT (ful VES trant) blocks the effects of estrogen. It is used to treat breast cancer. This medicine may be used for other purposes; ask your health care provider or pharmacist if you have questions. What should I tell my health care provider before I take this medicine? They need to know if you have any of these conditions: -bleeding problems -liver disease -low levels of platelets in the blood -an unusual or allergic reaction to fulvestrant, other medicines, foods, dyes, or preservatives -pregnant or trying to get pregnant -breast-feeding How should I use this medicine? This medicine is for injection into a muscle. It is usually given by a health care professional in a hospital or clinic setting. Talk to your pediatrician regarding the use of this medicine in children. Special care may be needed. Overdosage: If you think you have taken too much of this medicine contact a poison control center or emergency room at once. NOTE: This medicine is only for you. Do not share this medicine with others. What if I miss a dose? It is important not to miss your dose. Call your doctor or health care professional if you are unable to keep an appointment. What may interact with this medicine? -medicines that treat or prevent blood clots like warfarin, enoxaparin, and dalteparin This list may not describe all possible interactions. Give your health care provider a list of all the medicines, herbs, non-prescription drugs, or dietary supplements you use. Also tell them if you smoke, drink alcohol, or use illegal drugs. Some items may interact with your medicine. What should I watch for while using this medicine? Your condition will be monitored carefully while you are receiving this medicine. You will need important blood work done while you are taking this medicine. Do not become pregnant while taking this medicine or for at least 1 year after stopping  it. Women of child-bearing potential will need to have a negative pregnancy test before starting this medicine. Women should inform their doctor if they wish to become pregnant or think they might be pregnant. There is a potential for serious side effects to an unborn child. Men should inform their doctors if they wish to father a child. This medicine may lower sperm counts. Talk to your health care professional or pharmacist for more information. Do not breast-feed an infant while taking this medicine or for 1 year after the last dose. What side effects may I notice from receiving this medicine? Side effects that you should report to your doctor or health care professional as soon as possible: -allergic reactions like skin rash, itching or hives, swelling of the face, lips, or tongue -feeling faint or lightheaded, falls -pain, tingling, numbness, or weakness in the legs -signs and symptoms of infection like fever or chills; cough; flu-like symptoms; sore throat -vaginal bleeding Side effects that usually do not require medical attention (report to your doctor or health care professional if they continue or are bothersome): -aches, pains -constipation -diarrhea -headache -hot flashes -nausea, vomiting -pain at site where injected -stomach pain This list may not describe all possible side effects. Call your doctor for medical advice about side effects. You may report side effects to FDA at 1-800-FDA-1088. Where should I keep my medicine? This drug is given in a hospital or clinic and will not be stored at home. NOTE: This sheet is a summary. It may not cover all possible information. If you have questions about this medicine, talk to your doctor, pharmacist, or health  care provider.    2016, [employer]. (2015-05-03 11:03:55)    Zoledronic Acid injection (Hypercalcemia, Oncology) What is this medicine? ZOLEDRONIC ACID (ZOE le dron ik AS id) lowers the amount of calcium loss  from bone. It is used to treat too much calcium in your blood from cancer. It is also used to prevent complications of cancer that has spread to the bone. This medicine may be used for other purposes; ask your health care provider or pharmacist if you have questions. What should I tell my health care provider before I take this medicine? They need to know if you have any of these conditions: -aspirin-sensitive asthma -cancer, especially if you are receiving medicines used to treat cancer -dental disease or wear dentures -infection -kidney disease -receiving corticosteroids like dexamethasone or prednisone -an unusual or allergic reaction to zoledronic acid, other medicines, foods, dyes, or preservatives -pregnant or trying to get pregnant -breast-feeding How should I use this medicine? This medicine is for infusion into a vein. It is given by a health care professional in a hospital or clinic setting. Talk to your pediatrician regarding the use of this medicine in children. Special care may be needed. Overdosage: If you think you have taken too much of this medicine contact a poison control center or emergency room at once. NOTE: This medicine is only for you. Do not share this medicine with others. What if I miss a dose? It is important not to miss your dose. Call your doctor or health care professional if you are unable to keep an appointment. What may interact with this medicine? -certain antibiotics given by injection -NSAIDs, medicines for pain and inflammation, like ibuprofen or naproxen -some diuretics like bumetanide, furosemide -teriparatide -thalidomide This list may not describe all possible interactions. Give your health care provider a list of all the medicines, herbs, non-prescription drugs, or dietary supplements you use. Also tell them if you smoke, drink alcohol, or use illegal drugs. Some items may interact with your medicine. What should I watch for while using this  medicine? Visit your doctor or health care professional for regular checkups. It may be some time before you see the benefit from this medicine. Do not stop taking your medicine unless your doctor tells you to. Your doctor may order blood tests or other tests to see how you are doing. Women should inform their doctor if they wish to become pregnant or think they might be pregnant. There is a potential for serious side effects to an unborn child. Talk to your health care professional or pharmacist for more information. You should make sure that you get enough calcium and vitamin D while you are taking this medicine. Discuss the foods you eat and the vitamins you take with your health care professional. Some people who take this medicine have severe bone, joint, and/or muscle pain. This medicine may also increase your risk for jaw problems or a broken thigh bone. Tell your doctor right away if you have severe pain in your jaw, bones, joints, or muscles. Tell your doctor if you have any pain that does not go away or that gets worse. Tell your dentist and dental surgeon that you are taking this medicine. You should not have major dental surgery while on this medicine. See your dentist to have a dental exam and fix any dental problems before starting this medicine. Take good care of your teeth while on this medicine. Make sure you see your dentist for regular follow-up appointments. What side effects  may I notice from receiving this medicine? Side effects that you should report to your doctor or health care professional as soon as possible: -allergic reactions like skin rash, itching or hives, swelling of the face, lips, or tongue -anxiety, confusion, or depression -breathing problems -changes in vision -eye pain -feeling faint or lightheaded, falls -jaw pain, especially after dental work -mouth sores -muscle cramps, stiffness, or weakness -redness, blistering, peeling or loosening of the skin, including  inside the mouth -trouble passing urine or change in the amount of urine Side effects that usually do not require medical attention (report to your doctor or health care professional if they continue or are bothersome): -bone, joint, or muscle pain -constipation -diarrhea -fever -hair loss -irritation at site where injected -loss of appetite -nausea, vomiting -stomach upset -trouble sleeping -trouble swallowing -weak or tired This list may not describe all possible side effects. Call your doctor for medical advice about side effects. You may report side effects to FDA at 1-800-FDA-1088. Where should I keep my medicine? This drug is given in a hospital or clinic and will not be stored at home. NOTE: This sheet is a summary. It may not cover all possible information. If you have questions about this medicine, talk to your doctor, pharmacist, or health care provider.    2016, [employer]. (2014-03-03 14:19:39)

## 2016-09-04 ENCOUNTER — Telehealth: Payer: Self-pay | Admitting: *Deleted

## 2016-09-04 NOTE — Telephone Encounter (Signed)
Pt called requesting refill of pain meds.  Spoke with pt, and was informed that pt needed refill of Hydrocodone.  Stated she does have pain some all over ; however, pt takes Hydrocodone when she feels drained - Hydrocodone does give pt energy to function. Pt's    Phone    [phone]

## 2016-09-04 NOTE — Telephone Encounter (Signed)
This RN attempted to reach pt per need for refill.  Note pt is a stage IV breast with known pain - and dementia ( what she understands as low energy is her description of pain , medication relieves pain and allows her to manage her ADL's )  Last fill was 8/22 with 30 tablets.  MD will refill medication - need to let pt know to pick up on 09/07/2016.  This RN will take prescription to book for pick up on Monday.

## 2016-09-16 ENCOUNTER — Ambulatory Visit (HOSPITAL_BASED_OUTPATIENT_CLINIC_OR_DEPARTMENT_OTHER): Payer: Medicare Other

## 2016-09-16 ENCOUNTER — Other Ambulatory Visit: Payer: Self-pay | Admitting: *Deleted

## 2016-09-16 ENCOUNTER — Other Ambulatory Visit (HOSPITAL_BASED_OUTPATIENT_CLINIC_OR_DEPARTMENT_OTHER): Payer: Medicare Other

## 2016-09-16 VITALS — BP 152/56 | HR 80 | Temp 98.8°F | Resp 18

## 2016-09-16 DIAGNOSIS — C50912 Malignant neoplasm of unspecified site of left female breast: Secondary | ICD-10-CM

## 2016-09-16 DIAGNOSIS — C50811 Malignant neoplasm of overlapping sites of right female breast: Secondary | ICD-10-CM

## 2016-09-16 DIAGNOSIS — Z170 Estrogen receptor positive status [ER+]: Principal | ICD-10-CM

## 2016-09-16 DIAGNOSIS — C7951 Secondary malignant neoplasm of bone: Secondary | ICD-10-CM

## 2016-09-16 DIAGNOSIS — Z5111 Encounter for antineoplastic chemotherapy: Secondary | ICD-10-CM | POA: Diagnosis present

## 2016-09-16 DIAGNOSIS — C792 Secondary malignant neoplasm of skin: Secondary | ICD-10-CM | POA: Diagnosis not present

## 2016-09-16 DIAGNOSIS — C50412 Malignant neoplasm of upper-outer quadrant of left female breast: Secondary | ICD-10-CM

## 2016-09-16 LAB — CBC WITH DIFFERENTIAL/PLATELET
BASO%: 0.6 % (ref 0.0–2.0)
Basophils Absolute: 0 10*3/uL (ref 0.0–0.1)
EOS%: 2.3 % (ref 0.0–7.0)
Eosinophils Absolute: 0.2 10*3/uL (ref 0.0–0.5)
HEMATOCRIT: 41.6 % (ref 34.8–46.6)
HEMOGLOBIN: 13.6 g/dL (ref 11.6–15.9)
LYMPH#: 0.7 10*3/uL — AB (ref 0.9–3.3)
LYMPH%: 10.6 % — ABNORMAL LOW (ref 14.0–49.7)
MCH: 29.6 pg (ref 25.1–34.0)
MCHC: 32.8 g/dL (ref 31.5–36.0)
MCV: 90.1 fL (ref 79.5–101.0)
MONO#: 0.8 10*3/uL (ref 0.1–0.9)
MONO%: 12.1 % (ref 0.0–14.0)
NEUT%: 74.4 % (ref 38.4–76.8)
NEUTROS ABS: 5.2 10*3/uL (ref 1.5–6.5)
Platelets: 230 10*3/uL (ref 145–400)
RBC: 4.61 10*6/uL (ref 3.70–5.45)
RDW: 14.8 % — AB (ref 11.2–14.5)
WBC: 7 10*3/uL (ref 3.9–10.3)

## 2016-09-16 LAB — COMPREHENSIVE METABOLIC PANEL
ALBUMIN: 3.2 g/dL — AB (ref 3.5–5.0)
ALT: 18 U/L (ref 0–55)
AST: 22 U/L (ref 5–34)
Alkaline Phosphatase: 92 U/L (ref 40–150)
Anion Gap: 9 mEq/L (ref 3–11)
BUN: 14.9 mg/dL (ref 7.0–26.0)
CALCIUM: 9.6 mg/dL (ref 8.4–10.4)
CHLORIDE: 104 meq/L (ref 98–109)
CO2: 26 mEq/L (ref 22–29)
CREATININE: 0.8 mg/dL (ref 0.6–1.1)
EGFR: 63 mL/min/{1.73_m2} — ABNORMAL LOW (ref >90)
Glucose: 145 mg/dl — ABNORMAL HIGH (ref 70–140)
Potassium: 3.7 mEq/L (ref 3.5–5.1)
Sodium: 140 mEq/L (ref 136–145)
TOTAL PROTEIN: 6.5 g/dL (ref 6.4–8.3)
Total Bilirubin: 1.02 mg/dL (ref 0.20–1.20)

## 2016-09-16 MED ORDER — FULVESTRANT 250 MG/5ML IM SOLN
500.0000 mg | Freq: Once | INTRAMUSCULAR | Status: AC
  Administered 2016-09-16: 500 mg via INTRAMUSCULAR
  Filled 2016-09-16: qty 10

## 2016-09-16 MED ORDER — HYDROCODONE-ACETAMINOPHEN 5-325 MG PO TABS: 1.0000 | ORAL_TABLET | ORAL | 0 refills | Status: DC | PRN

## 2016-09-16 NOTE — Patient Instructions (Signed)
Fulvestrant injection What is this medicine? FULVESTRANT (ful VES trant) blocks the effects of estrogen. It is used to treat breast cancer. This medicine may be used for other purposes; ask your health care provider or pharmacist if you have questions. What should I tell my health care provider before I take this medicine? They need to know if you have any of these conditions: -bleeding problems -liver disease -low levels of platelets in the blood -an unusual or allergic reaction to fulvestrant, other medicines, foods, dyes, or preservatives -pregnant or trying to get pregnant -breast-feeding How should I use this medicine? This medicine is for injection into a muscle. It is usually given by a health care professional in a hospital or clinic setting. Talk to your pediatrician regarding the use of this medicine in children. Special care may be needed. Overdosage: If you think you have taken too much of this medicine contact a poison control center or emergency room at once. NOTE: This medicine is only for you. Do not share this medicine with others. What if I miss a dose? It is important not to miss your dose. Call your doctor or health care professional if you are unable to keep an appointment. What may interact with this medicine? -medicines that treat or prevent blood clots like warfarin, enoxaparin, and dalteparin This list may not describe all possible interactions. Give your health care provider a list of all the medicines, herbs, non-prescription drugs, or dietary supplements you use. Also tell them if you smoke, drink alcohol, or use illegal drugs. Some items may interact with your medicine. What should I watch for while using this medicine? Your condition will be monitored carefully while you are receiving this medicine. You will need important blood work done while you are taking this medicine. Do not become pregnant while taking this medicine or for at least 1 year after stopping  it. Women of child-bearing potential will need to have a negative pregnancy test before starting this medicine. Women should inform their doctor if they wish to become pregnant or think they might be pregnant. There is a potential for serious side effects to an unborn child. Men should inform their doctors if they wish to father a child. This medicine may lower sperm counts. Talk to your health care professional or pharmacist for more information. Do not breast-feed an infant while taking this medicine or for 1 year after the last dose. What side effects may I notice from receiving this medicine? Side effects that you should report to your doctor or health care professional as soon as possible: -allergic reactions like skin rash, itching or hives, swelling of the face, lips, or tongue -feeling faint or lightheaded, falls -pain, tingling, numbness, or weakness in the legs -signs and symptoms of infection like fever or chills; cough; flu-like symptoms; sore throat -vaginal bleeding Side effects that usually do not require medical attention (report to your doctor or health care professional if they continue or are bothersome): -aches, pains -constipation -diarrhea -headache -hot flashes -nausea, vomiting -pain at site where injected -stomach pain This list may not describe all possible side effects. Call your doctor for medical advice about side effects. You may report side effects to FDA at 1-800-FDA-1088. Where should I keep my medicine? This drug is given in a hospital or clinic and will not be stored at home. NOTE: This sheet is a summary. It may not cover all possible information. If you have questions about this medicine, talk to your doctor, pharmacist, or health  care provider.    2016, [employer]. (2015-05-03 11:03:55)    Zoledronic Acid injection (Hypercalcemia, Oncology) What is this medicine? ZOLEDRONIC ACID (ZOE le dron ik AS id) lowers the amount of calcium loss  from bone. It is used to treat too much calcium in your blood from cancer. It is also used to prevent complications of cancer that has spread to the bone. This medicine may be used for other purposes; ask your health care provider or pharmacist if you have questions. What should I tell my health care provider before I take this medicine? They need to know if you have any of these conditions: -aspirin-sensitive asthma -cancer, especially if you are receiving medicines used to treat cancer -dental disease or wear dentures -infection -kidney disease -receiving corticosteroids like dexamethasone or prednisone -an unusual or allergic reaction to zoledronic acid, other medicines, foods, dyes, or preservatives -pregnant or trying to get pregnant -breast-feeding How should I use this medicine? This medicine is for infusion into a vein. It is given by a health care professional in a hospital or clinic setting. Talk to your pediatrician regarding the use of this medicine in children. Special care may be needed. Overdosage: If you think you have taken too much of this medicine contact a poison control center or emergency room at once. NOTE: This medicine is only for you. Do not share this medicine with others. What if I miss a dose? It is important not to miss your dose. Call your doctor or health care professional if you are unable to keep an appointment. What may interact with this medicine? -certain antibiotics given by injection -NSAIDs, medicines for pain and inflammation, like ibuprofen or naproxen -some diuretics like bumetanide, furosemide -teriparatide -thalidomide This list may not describe all possible interactions. Give your health care provider a list of all the medicines, herbs, non-prescription drugs, or dietary supplements you use. Also tell them if you smoke, drink alcohol, or use illegal drugs. Some items may interact with your medicine. What should I watch for while using this  medicine? Visit your doctor or health care professional for regular checkups. It may be some time before you see the benefit from this medicine. Do not stop taking your medicine unless your doctor tells you to. Your doctor may order blood tests or other tests to see how you are doing. Women should inform their doctor if they wish to become pregnant or think they might be pregnant. There is a potential for serious side effects to an unborn child. Talk to your health care professional or pharmacist for more information. You should make sure that you get enough calcium and vitamin D while you are taking this medicine. Discuss the foods you eat and the vitamins you take with your health care professional. Some people who take this medicine have severe bone, joint, and/or muscle pain. This medicine may also increase your risk for jaw problems or a broken thigh bone. Tell your doctor right away if you have severe pain in your jaw, bones, joints, or muscles. Tell your doctor if you have any pain that does not go away or that gets worse. Tell your dentist and dental surgeon that you are taking this medicine. You should not have major dental surgery while on this medicine. See your dentist to have a dental exam and fix any dental problems before starting this medicine. Take good care of your teeth while on this medicine. Make sure you see your dentist for regular follow-up appointments. What side effects  may I notice from receiving this medicine? Side effects that you should report to your doctor or health care professional as soon as possible: -allergic reactions like skin rash, itching or hives, swelling of the face, lips, or tongue -anxiety, confusion, or depression -breathing problems -changes in vision -eye pain -feeling faint or lightheaded, falls -jaw pain, especially after dental work -mouth sores -muscle cramps, stiffness, or weakness -redness, blistering, peeling or loosening of the skin, including  inside the mouth -trouble passing urine or change in the amount of urine Side effects that usually do not require medical attention (report to your doctor or health care professional if they continue or are bothersome): -bone, joint, or muscle pain -constipation -diarrhea -fever -hair loss -irritation at site where injected -loss of appetite -nausea, vomiting -stomach upset -trouble sleeping -trouble swallowing -weak or tired This list may not describe all possible side effects. Call your doctor for medical advice about side effects. You may report side effects to FDA at 1-800-FDA-1088. Where should I keep my medicine? This drug is given in a hospital or clinic and will not be stored at home. NOTE: This sheet is a summary. It may not cover all possible information. If you have questions about this medicine, talk to your doctor, pharmacist, or health care provider.    2016, [employer]. (2014-03-03 14:19:39)

## 2016-10-14 ENCOUNTER — Ambulatory Visit: Payer: Medicare Other

## 2016-10-14 ENCOUNTER — Other Ambulatory Visit: Payer: Medicare Other

## 2016-10-15 ENCOUNTER — Ambulatory Visit (HOSPITAL_COMMUNITY)
Admission: RE | Admit: 2016-10-15 | Discharge: 2016-10-15 | Disposition: A | Discharge status: Home / Self Care | Payer: Medicare Other | Source: Ambulatory Visit | Attending: Oncology | Admitting: Oncology

## 2016-10-15 DIAGNOSIS — C50912 Malignant neoplasm of unspecified site of left female breast: Secondary | ICD-10-CM | POA: Diagnosis not present

## 2016-10-15 DIAGNOSIS — C50811 Malignant neoplasm of overlapping sites of right female breast: Secondary | ICD-10-CM | POA: Diagnosis not present

## 2016-10-15 DIAGNOSIS — C50911 Malignant neoplasm of unspecified site of right female breast: Secondary | ICD-10-CM | POA: Insufficient documentation

## 2016-10-15 DIAGNOSIS — Z170 Estrogen receptor positive status [ER+]: Secondary | ICD-10-CM | POA: Insufficient documentation

## 2016-10-15 DIAGNOSIS — C7951 Secondary malignant neoplasm of bone: Secondary | ICD-10-CM | POA: Diagnosis not present

## 2016-10-15 DIAGNOSIS — C50412 Malignant neoplasm of upper-outer quadrant of left female breast: Secondary | ICD-10-CM

## 2016-10-15 DIAGNOSIS — R911 Solitary pulmonary nodule: Secondary | ICD-10-CM | POA: Insufficient documentation

## 2016-10-15 LAB — GLUCOSE, CAPILLARY: Glucose-Capillary: 88 mg/dL (ref 65–99)

## 2016-10-15 MED ORDER — FLUDEOXYGLUCOSE F - 18 (FDG) INJECTION
6.2000 | Freq: Once | INTRAVENOUS | Status: AC | PRN
  Administered 2016-10-15: 6.2 via INTRAVENOUS

## 2016-10-16 ENCOUNTER — Ambulatory Visit (HOSPITAL_BASED_OUTPATIENT_CLINIC_OR_DEPARTMENT_OTHER): Payer: Medicare Other

## 2016-10-16 ENCOUNTER — Ambulatory Visit (HOSPITAL_BASED_OUTPATIENT_CLINIC_OR_DEPARTMENT_OTHER): Payer: Medicare Other | Admitting: Oncology

## 2016-10-16 ENCOUNTER — Ambulatory Visit: Payer: Medicare Other

## 2016-10-16 ENCOUNTER — Other Ambulatory Visit (HOSPITAL_BASED_OUTPATIENT_CLINIC_OR_DEPARTMENT_OTHER): Payer: Medicare Other

## 2016-10-16 VITALS — BP 140/64 | HR 76 | Temp 98.5°F | Resp 18 | Ht 60.0 in | Wt 121.1 lb

## 2016-10-16 DIAGNOSIS — C792 Secondary malignant neoplasm of skin: Secondary | ICD-10-CM

## 2016-10-16 DIAGNOSIS — C50412 Malignant neoplasm of upper-outer quadrant of left female breast: Secondary | ICD-10-CM

## 2016-10-16 DIAGNOSIS — Z170 Estrogen receptor positive status [ER+]: Principal | ICD-10-CM

## 2016-10-16 DIAGNOSIS — Z5111 Encounter for antineoplastic chemotherapy: Secondary | ICD-10-CM

## 2016-10-16 DIAGNOSIS — C50811 Malignant neoplasm of overlapping sites of right female breast: Secondary | ICD-10-CM

## 2016-10-16 DIAGNOSIS — C3492 Malignant neoplasm of unspecified part of left bronchus or lung: Secondary | ICD-10-CM | POA: Insufficient documentation

## 2016-10-16 DIAGNOSIS — M810 Age-related osteoporosis without current pathological fracture: Secondary | ICD-10-CM

## 2016-10-16 DIAGNOSIS — C773 Secondary and unspecified malignant neoplasm of axilla and upper limb lymph nodes: Secondary | ICD-10-CM

## 2016-10-16 DIAGNOSIS — C50912 Malignant neoplasm of unspecified site of left female breast: Secondary | ICD-10-CM

## 2016-10-16 DIAGNOSIS — C7951 Secondary malignant neoplasm of bone: Secondary | ICD-10-CM

## 2016-10-16 DIAGNOSIS — C3412 Malignant neoplasm of upper lobe, left bronchus or lung: Secondary | ICD-10-CM

## 2016-10-16 LAB — COMPREHENSIVE METABOLIC PANEL
ALT: 24 U/L (ref 0–55)
AST: 27 U/L (ref 5–34)
Albumin: 3.5 g/dL (ref 3.5–5.0)
Alkaline Phosphatase: 78 U/L (ref 40–150)
Anion Gap: 10 mEq/L (ref 3–11)
BUN: 14.5 mg/dL (ref 7.0–26.0)
CALCIUM: 9.4 mg/dL (ref 8.4–10.4)
CHLORIDE: 105 meq/L (ref 98–109)
CO2: 25 mEq/L (ref 22–29)
Creatinine: 0.8 mg/dL (ref 0.6–1.1)
EGFR: 63 mL/min/{1.73_m2} — ABNORMAL LOW (ref >90)
Glucose: 95 mg/dl (ref 70–140)
POTASSIUM: 4.1 meq/L (ref 3.5–5.1)
Sodium: 139 mEq/L (ref 136–145)
Total Bilirubin: 1.08 mg/dL (ref 0.20–1.20)
Total Protein: 6.7 g/dL (ref 6.4–8.3)

## 2016-10-16 LAB — CBC WITH DIFFERENTIAL/PLATELET
BASO%: 0.7 % (ref 0.0–2.0)
BASOS ABS: 0 10*3/uL (ref 0.0–0.1)
EOS%: 3.5 % (ref 0.0–7.0)
Eosinophils Absolute: 0.2 10*3/uL (ref 0.0–0.5)
HEMATOCRIT: 42.1 % (ref 34.8–46.6)
HGB: 14.3 g/dL (ref 11.6–15.9)
LYMPH#: 0.9 10*3/uL (ref 0.9–3.3)
LYMPH%: 15.9 % (ref 14.0–49.7)
MCH: 30.3 pg (ref 25.1–34.0)
MCHC: 33.9 g/dL (ref 31.5–36.0)
MCV: 89.2 fL (ref 79.5–101.0)
MONO#: 0.8 10*3/uL (ref 0.1–0.9)
MONO%: 14.1 % — AB (ref 0.0–14.0)
NEUT#: 3.8 10*3/uL (ref 1.5–6.5)
NEUT%: 65.8 % (ref 38.4–76.8)
Platelets: 263 10*3/uL (ref 145–400)
RBC: 4.72 10*6/uL (ref 3.70–5.45)
RDW: 14.5 % (ref 11.2–14.5)
WBC: 5.7 10*3/uL (ref 3.9–10.3)

## 2016-10-16 MED ORDER — LETROZOLE 2.5 MG PO TABS: 2.5000 mg | ORAL_TABLET | Freq: Every day | ORAL | 4 refills | Status: DC

## 2016-10-16 MED ORDER — FULVESTRANT 250 MG/5ML IM SOLN
500.0000 mg | Freq: Once | INTRAMUSCULAR | Status: AC
  Administered 2016-10-16: 500 mg via INTRAMUSCULAR
  Filled 2016-10-16: qty 10

## 2016-10-16 NOTE — Patient Instructions (Signed)
Fulvestrant injection What is this medicine? FULVESTRANT (ful VES trant) blocks the effects of estrogen. It is used to treat breast cancer. This medicine may be used for other purposes; ask your health care provider or pharmacist if you have questions. What should I tell my health care provider before I take this medicine? They need to know if you have any of these conditions: -bleeding problems -liver disease -low levels of platelets in the blood -an unusual or allergic reaction to fulvestrant, other medicines, foods, dyes, or preservatives -pregnant or trying to get pregnant -breast-feeding How should I use this medicine? This medicine is for injection into a muscle. It is usually given by a health care professional in a hospital or clinic setting. Talk to your pediatrician regarding the use of this medicine in children. Special care may be needed. Overdosage: If you think you have taken too much of this medicine contact a poison control center or emergency room at once. NOTE: This medicine is only for you. Do not share this medicine with others. What if I miss a dose? It is important not to miss your dose. Call your doctor or health care professional if you are unable to keep an appointment. What may interact with this medicine? -medicines that treat or prevent blood clots like warfarin, enoxaparin, and dalteparin This list may not describe all possible interactions. Give your health care provider a list of all the medicines, herbs, non-prescription drugs, or dietary supplements you use. Also tell them if you smoke, drink alcohol, or use illegal drugs. Some items may interact with your medicine. What should I watch for while using this medicine? Your condition will be monitored carefully while you are receiving this medicine. You will need important blood work done while you are taking this medicine. Do not become pregnant while taking this medicine or for at least 1 year after stopping  it. Women of child-bearing potential will need to have a negative pregnancy test before starting this medicine. Women should inform their doctor if they wish to become pregnant or think they might be pregnant. There is a potential for serious side effects to an unborn child. Men should inform their doctors if they wish to father a child. This medicine may lower sperm counts. Talk to your health care professional or pharmacist for more information. Do not breast-feed an infant while taking this medicine or for 1 year after the last dose. What side effects may I notice from receiving this medicine? Side effects that you should report to your doctor or health care professional as soon as possible: -allergic reactions like skin rash, itching or hives, swelling of the face, lips, or tongue -feeling faint or lightheaded, falls -pain, tingling, numbness, or weakness in the legs -signs and symptoms of infection like fever or chills; cough; flu-like symptoms; sore throat -vaginal bleeding Side effects that usually do not require medical attention (report to your doctor or health care professional if they continue or are bothersome): -aches, pains -constipation -diarrhea -headache -hot flashes -nausea, vomiting -pain at site where injected -stomach pain This list may not describe all possible side effects. Call your doctor for medical advice about side effects. You may report side effects to FDA at 1-800-FDA-1088. Where should I keep my medicine? This drug is given in a hospital or clinic and will not be stored at home. NOTE: This sheet is a summary. It may not cover all possible information. If you have questions about this medicine, talk to your doctor, pharmacist, or health  care provider.    2016, [employer]. (2015-05-03 11:03:55)    Zoledronic Acid injection (Hypercalcemia, Oncology) What is this medicine? ZOLEDRONIC ACID (ZOE le dron ik AS id) lowers the amount of calcium loss  from bone. It is used to treat too much calcium in your blood from cancer. It is also used to prevent complications of cancer that has spread to the bone. This medicine may be used for other purposes; ask your health care provider or pharmacist if you have questions. What should I tell my health care provider before I take this medicine? They need to know if you have any of these conditions: -aspirin-sensitive asthma -cancer, especially if you are receiving medicines used to treat cancer -dental disease or wear dentures -infection -kidney disease -receiving corticosteroids like dexamethasone or prednisone -an unusual or allergic reaction to zoledronic acid, other medicines, foods, dyes, or preservatives -pregnant or trying to get pregnant -breast-feeding How should I use this medicine? This medicine is for infusion into a vein. It is given by a health care professional in a hospital or clinic setting. Talk to your pediatrician regarding the use of this medicine in children. Special care may be needed. Overdosage: If you think you have taken too much of this medicine contact a poison control center or emergency room at once. NOTE: This medicine is only for you. Do not share this medicine with others. What if I miss a dose? It is important not to miss your dose. Call your doctor or health care professional if you are unable to keep an appointment. What may interact with this medicine? -certain antibiotics given by injection -NSAIDs, medicines for pain and inflammation, like ibuprofen or naproxen -some diuretics like bumetanide, furosemide -teriparatide -thalidomide This list may not describe all possible interactions. Give your health care provider a list of all the medicines, herbs, non-prescription drugs, or dietary supplements you use. Also tell them if you smoke, drink alcohol, or use illegal drugs. Some items may interact with your medicine. What should I watch for while using this  medicine? Visit your doctor or health care professional for regular checkups. It may be some time before you see the benefit from this medicine. Do not stop taking your medicine unless your doctor tells you to. Your doctor may order blood tests or other tests to see how you are doing. Women should inform their doctor if they wish to become pregnant or think they might be pregnant. There is a potential for serious side effects to an unborn child. Talk to your health care professional or pharmacist for more information. You should make sure that you get enough calcium and vitamin D while you are taking this medicine. Discuss the foods you eat and the vitamins you take with your health care professional. Some people who take this medicine have severe bone, joint, and/or muscle pain. This medicine may also increase your risk for jaw problems or a broken thigh bone. Tell your doctor right away if you have severe pain in your jaw, bones, joints, or muscles. Tell your doctor if you have any pain that does not go away or that gets worse. Tell your dentist and dental surgeon that you are taking this medicine. You should not have major dental surgery while on this medicine. See your dentist to have a dental exam and fix any dental problems before starting this medicine. Take good care of your teeth while on this medicine. Make sure you see your dentist for regular follow-up appointments. What side effects  may I notice from receiving this medicine? Side effects that you should report to your doctor or health care professional as soon as possible: -allergic reactions like skin rash, itching or hives, swelling of the face, lips, or tongue -anxiety, confusion, or depression -breathing problems -changes in vision -eye pain -feeling faint or lightheaded, falls -jaw pain, especially after dental work -mouth sores -muscle cramps, stiffness, or weakness -redness, blistering, peeling or loosening of the skin, including  inside the mouth -trouble passing urine or change in the amount of urine Side effects that usually do not require medical attention (report to your doctor or health care professional if they continue or are bothersome): -bone, joint, or muscle pain -constipation -diarrhea -fever -hair loss -irritation at site where injected -loss of appetite -nausea, vomiting -stomach upset -trouble sleeping -trouble swallowing -weak or tired This list may not describe all possible side effects. Call your doctor for medical advice about side effects. You may report side effects to FDA at 1-800-FDA-1088. Where should I keep my medicine? This drug is given in a hospital or clinic and will not be stored at home. NOTE: This sheet is a summary. It may not cover all possible information. If you have questions about this medicine, talk to your doctor, pharmacist, or health care provider.    2016, [employer]. (2014-03-03 14:19:39)

## 2016-10-16 NOTE — Progress Notes (Signed)
[place]  [phone] [phone]   ID: GARI TROVATO DOB: Jul 06, 80 1926  MR#: [id number]  TDV#:[id number]  Patient Care Team: Dorena Cookey, MD as PCP - General (Family Medicine) Fanny Skates, MD as Consulting Physician (General Surgery) Chauncey Cruel, MD as Consulting Physician (Oncology) [name], MD as Consulting Physician (Radiation Oncology) Rockwell Germany, RN as Registered Nurse Mauro Kaufmann, RN as Registered Nurse PCP: Joycelyn Man, MD GYN: OTHER MD: Danton Sewer MD, Thompson Grayer MD, Unice Bailey MD, Rolm Bookbinder MD  CHIEF COMPLAINT: bilateral estrogen receptor positive breast cancer, metastatic to bone  CURRENT TREATMENT: fulvestrant, zolendronate, letrozole  NOTE: Patient requests her daughter [name] be contacted regarding all appointments and procedures. [name] can be reached at [phone]  INTERVAL HISTORY: Dot returns today for follow-up of her stage IV estrogen receptor positive breast cancer, accompanied by her daughter. accompanied by her daughter. Dot is being treated with fulvestrant every 4 weeks, which she tolerates well, with no pain fatigue or other side effects. She also receives zolendronate every 3 months. She has no side effects related to that.  Yesterday she had a restaging PET scan. This shows improvement in both axillae and stable disease and the left breast the mass in the right breast is slightly larger and more metabolically active. There is also a right subpectoral lymph node which is "pop" and new, although it only measures a fourth 7 inch. Of interest, there is a left mid lung nodule measuring 3.2 cm, previously 2.3 cm. The SUV max for this is 5.1 set compared to 2.9 previously. This is felt to be suspicious for primary bronchogenic adenocarcinoma.  The bone lesion at T11 is sclerotic and there are no other new bone lesions  REVIEW OF SYSTEMS: Dot is still not exercising but otherwise she is doing  well. She has a poor appetite she says. Sometimes she has a little dry cough. She bruises easily. Overall she feels "fairly well". A detailed review of systems today was stable  BREAST CANCER HISTORY: From the original intake note:  "Dot"  Has been aware of a sore right nipple for the last 6-8 months. She attributed it to her washing machine, which rubs that area when she uses it. On  11/21/2014 she saw Dr. 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 She mentioned the chest wall problem and Dr. Ubaldo Glassing obtained to skin biopsies 1 from the right inferior central chest and the other one of from the right mid medial chest.  The second of these showed a nodular basal cell carcinoma. The first one however showed invasive carcinoma consistent with a breast primary. This tumor was estrogen receptor positive at 100%, progesterone receptor positive at 36%, both with strong staining intensity, and HER-2 was amplified , the signals ratio being 6.2. [name] [id number])  On 12/06/2014 the patient underwent bilateral diagnostic mammography with bilateral ultrasonography. Breast density was category B. There was bilateral nipple retraction. In the left breast upper outer quadrant there was a spiculated mass noted by mammography with a second spiculated mass in the lower inner quadrant. By ultrasound there was a 1.6 cm hypoechoic lesion at the 1:30 o'clock position in the left breast, and a second mass more medially measuring 0.9 cm. Also at the 8:00 position in the left breast 2 cm from the nipple there was a 1.3 cm mass. Ultrasound of the left axilla showed an enlarged lymph node with fatty hilum replacement, measuring 1.4 cm.  In the right breast mammography showed a cluster of indeterminate microcalcifications in the upper outer quadrant and a retroareolar mass, which bile does sound measured 2.9 cm. Ultrasound of the right axilla showed a right axilla lymph node measuring 1.4 cm.  On 12/06/2014  the patient underwent needle core biopsy of 2 separate masses in the left breast (at 1:00 and 8:00) as well as the enlarged axillary lymph node. All 3 were positive for an invasive lobular carcinoma (E-cadherin negative). All 3 tumors were estrogen receptor positive with strong staining intensity (between 87% and 100%). The lymph node was progesterone receptor negative, the other 2 masses being progesterone receptor positive at 9% and at 76%. This cancer was reported to be HER-2 positive at conference today, though I do not have the ratios at hand.  The patient is not an MRI candidate because she has a permanent pacemaker in place. Her subsequent history is as detailed below    PAST MEDICAL HISTORY: Past Medical History:  Diagnosis Date  . Asthma   . Breast cancer ([place]) 12/08/2014   Bilateral  . Cancer of central portion of female breast ([place]) 12/04/2014  . Cancer of central portion of female breast ([place]) 12/04/2014  . Chest pain   . Complete heart block ([place]) 1999   s/p PPM by Dr Olevia Perches, most recent generator change 2009  . COPD (chronic obstructive pulmonary disease) ([place])   . Diverticulosis   . DJD (degenerative joint disease)   . Esophageal stricture   . GERD (gastroesophageal reflux disease)   . Giant cell arteritis ([place])   . Goiter   . Headache(784.0)   . Heart murmur   . Hemorrhoids   . Hiatal hernia   . History of general anesthesia complication   . IBS (irritable bowel syndrome)   . Pacemaker   . Pneumonia   . Renal cyst   . S/P radiation therapy 04/24/15 completed   T-11  . Wears glasses     PAST SURGICAL HISTORY: Past Surgical History:  Procedure Laterality Date  . ANAL FISSURE REPAIR    . ARTERY BIOPSY Left 01/03/2013   Procedure: BIOPSY LEFT TEMPORAL ARTERY;  Surgeon: Ascencion Dike, MD;  Location: [facility];  Service: ENT;  Laterality: Left;  . BREAST BIOPSY Right   . CHOLECYSTECTOMY    . COLONOSCOPY    . FRACTURE SURGERY     fx lt wrist  .  hemorrhoidectomy    . left lower lobectomy for brochiectasis  1950  . MOUTH SURGERY    . PACEMAKER INSERTION  1999   Gen change ([facility]) by Dr Olevia Perches 2009  . RECTAL POLYPECTOMY    . RENAL CYST EXCISION    . TONSILLECTOMY      FAMILY HISTORY Family History  Problem Relation Age of Onset  . Heart disease Mother   . Breast cancer Mother   . Stomach cancer Father   . Alcohol abuse Father   . Seizures Son   . Thyroid disease Sister    the patient's father died at the age of 68, with stomach cancer. The patient's mother died at the age of 79 from a myocardial infarction. She had been diagnosed with breast cancer in her 51s. The patient had no brothers, 2 sisters. There is no other history of breast or ovarian cancer in the family to the patient's knowledge  GYNECOLOGIC HISTORY:  No LMP recorded. Patient is postmenopausal. Menarche age 6, first live birth age 51. The patient is [place] P4. She  underwent menopause in her late 80s. She did not take hormone replacement.  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 She is a widow, lives by herself, with no pets. Daughter Wendy Poet lives in [place] where she is Mudlogger of a preschool and day care. Son Carita Sollars lives in [place] where he owns a furniture to sign store. Daughter Shelly Rubenstein lives in [place]. She owns a Chiropractor. Son Randall Hiss is physically and mentally disabled and lives in a group home in [place]. The patient has 7 grandchildren and 2 great-grandchildren. She attends a local [employer]: In place; the patient's daughter [name] is her healthcare power of attorney. She can be reached at [place]: Social History  Substance Use Topics  . Smoking status: Never Smoker  . Smokeless tobacco: Never Used  . Alcohol use Not on file     Comment: Occasional      Colonoscopy:  PAP:  Bone density:02/19/2015, osteoporosis  Lipid  panel:  Allergies  Allergen Reactions  . Morphine And Related Nausea Only  . Amoxicillin-Pot Clavulanate Other (See Comments)    Unknown     Current Outpatient Prescriptions  Medication Sig Dispense Refill  . Calcium Citrate-Vitamin D (CITRACAL PETITES/VITAMIN D) 200-250 MG-UNIT TABS Take 1 tablet by mouth daily.      Marland Kitchen HYDROcodone-acetaminophen (NORCO/VICODIN) 5-325 MG tablet Take 1 tablet by mouth every 4 (four) hours as needed. 60 tablet 0  . letrozole (FEMARA) 2.5 MG tablet Take 1 tablet (2.5 mg total) by mouth daily. 90 tablet 4  . metoprolol succinate (TOPROL-XL) 100 MG 24 hr tablet TAKE 1/2 (ONE-HALF) TABLET BY MOUTH EVERY MORNING. Take with or immediately following a meal. 45 tablet 3  . naproxen sodium (ANAPROX) 220 MG tablet Take 220 mg by mouth 2 (two) times daily with a meal.     . predniSONE (DELTASONE) 5 MG tablet Take 5 mg by mouth daily with breakfast. Reported on 11/19/2015    . Probiotic Product (PROBIOTIC DAILY PO) Take 1 tablet by mouth daily. 1 Chew daily.    . traMADol (ULTRAM) 50 MG tablet Take 1 tablet (50 mg total) by mouth every 6 (six) hours as needed for moderate pain or severe pain. Reported on 11/19/2015 30 tablet 3  . TURMERIC PO Take 1 capsule by mouth daily.    . vitamin C (ASCORBIC ACID) 500 MG tablet Take 500 mg by mouth daily.       No current facility-administered medications for this visit.     OBJECTIVE: Elderly white woman In no acute distress Vitals:   10/16/16 0935  BP: 140/64  Pulse: 76  Resp: 18  Temp: 98.5 F (36.9 C)     Body mass index is 23.65 kg/m.    ECOG FS:1 - Symptomatic but completely ambulatory  Sclerae unicteric, EOMs intact Oropharynx clear and moist No cervical or supraclavicular adenopathy Lungs no rales or rhonchi Heart regular rate and rhythm Abd soft, nontender, positive bowel sounds MSK no focal spinal tenderness, no upper extremity lymphedema Neuro: nonfocal, well oriented, appropriate affect Breasts: The area  around the right nipple is still firm, movable, not associated with any erythema. The right axilla is benign. I do not palpate a mass in the left breast. The left axilla is benign.  LAB RESULTS:  CMP     Component Value Date/Time   NA 139 10/16/2016 0911   K 4.1 10/16/2016 0911  CL 104 03/07/2015 0925   CO2 25 10/16/2016 0911   GLUCOSE 95 10/16/2016 0911   BUN 14.5 10/16/2016 0911   CREATININE 0.8 10/16/2016 0911   CALCIUM 9.4 10/16/2016 0911   PROT 6.7 10/16/2016 0911   ALBUMIN 3.5 10/16/2016 0911   AST 27 10/16/2016 0911   ALT 24 10/16/2016 0911   ALKPHOS 78 10/16/2016 0911   BILITOT 1.08 10/16/2016 0911   GFRNONAA >60 03/07/2015 0925   GFRAA >60 03/07/2015 0925    INo results found for: SPEP, UPEP  Lab Results  Component Value Date   WBC 5.7 10/16/2016   NEUTROABS 3.8 10/16/2016   HGB 14.3 10/16/2016   HCT 42.1 10/16/2016   MCV 89.2 10/16/2016   PLT 263 10/16/2016      Chemistry      Component Value Date/Time   NA 139 10/16/2016 0911   K 4.1 10/16/2016 0911   CL 104 03/07/2015 0925   CO2 25 10/16/2016 0911   BUN 14.5 10/16/2016 0911   CREATININE 0.8 10/16/2016 0911      Component Value Date/Time   CALCIUM 9.4 10/16/2016 0911   ALKPHOS 78 10/16/2016 0911   AST 27 10/16/2016 0911   ALT 24 10/16/2016 0911   BILITOT 1.08 10/16/2016 0911       Lab Results  Component Value Date   LABCA2 20 04/01/2016    No components found for: [id number]  No results for input(s): INR in the last 168 hours.  Urinalysis    Component Value Date/Time   COLORURINE YELLOW 09/27/2014 1800   APPEARANCEUR CLEAR 09/27/2014 1800   LABSPEC 1.005 09/27/2014 1800   PHURINE 7.0 09/27/2014 1800   GLUCOSEU NEGATIVE 09/27/2014 1800   HGBUR NEGATIVE 09/27/2014 1800   HGBUR trace-intact 01/17/2010 0932   BILIRUBINUR NEGATIVE 09/27/2014 1800   BILIRUBINUR n 09/01/2011 1157   KETONESUR NEGATIVE 09/27/2014 1800   PROTEINUR NEGATIVE 09/27/2014 1800   UROBILINOGEN 0.2 09/27/2014  1800   NITRITE NEGATIVE 09/27/2014 1800   LEUKOCYTESUR TRACE (A) 09/27/2014 1800    STUDIES: Nm Pet Image Restag (ps) Skull Base To Thigh  Result Date: 10/15/2016 CLINICAL DATA:  Subsequent treatment strategy for bilateral breast carcinoma metastatic to bone. Restaging. EXAM: NUCLEAR MEDICINE PET SKULL BASE TO THIGH TECHNIQUE: 6.2 mCi F-18 FDG was injected intravenously. Full-ring PET imaging was performed from the skull base to thigh after the radiotracer. CT data was obtained and used for attenuation correction and anatomic localization. FASTING BLOOD GLUCOSE:  Value: 89 mg/dl COMPARISON:  12/19/2014 FINDINGS: NECK No hypermetabolic lymph nodes in the neck. CHEST No hypermetabolic mediastinal or hilar nodes. A new hypermetabolic right subpectoral lymph node is seen measuring 6 mm on image 56/4, with SUV max of 4.1. Mild hypermetabolic right axillary lymphadenopathy shows mild decrease in size. Index lymph node measures 6 mm on image 60/4 compared to 11 mm on previous study. This has SUV max of 5.7 compared to 7.1 previously. There has been resolution of mild hypermetabolic left axillary lymphadenopathy since previous study. Small mass in the lower outer quadrant of the left breast measures 1.6 x 1.0 cm on image 69/4. This shows no significant change compared to previous study. This has SUV max of 6.5 compared to 5.7 on previous study. Ill-defined mass in the subareolar region of right breast measures 2.7 x 1.8 cm on image 71/4, compared to 2.6 x 1.6 cm previously. This has SUV max of 19.8 on today's study compared to 8.9 previously. Postop changes again seen in left lung. Sub-solid nodule  in the posterior left midlung currently measures 3.2 x 1.8 cm compared to 2.3 by 1.3 cm previously. This sub-solid nodule has SUV max of 5.1 on today's study compared to 2.9 previously. This is suspicious for primary bronchogenic adenocarcinoma. No other suspicious pulmonary nodules seen on CT. Small hiatal hernia  showing hypermetabolic activity again noted. ABDOMEN/PELVIS No abnormal hypermetabolic activity within the liver, pancreas, adrenal glands, or spleen. No hypermetabolic lymph nodes in the abdomen or pelvis. Prior cholecystectomy. Aortic atherosclerosis. Sigmoid diverticulosis again demonstrated, without evidence of diverticulitis. SKELETON Previously seen lytic lesion in the T11 vertebral body now shows sclerosis. SUV max currently measures 3.0 compared to 7.9 previously. No definite new osseous metastatic lesions identified. IMPRESSION: Mixed metabolic response to therapy compared to prior study. Mild increase in size and hypermetabolic activity of right breast mass, and new sub-cm hypermetabolic right subpectoral lymph node, while bilateral axillary lymphadenopathy and T11 vertebral body metastasis shows interval improvement. Increased size and hypermetabolic activity of 3.2 cm sub-solid left lung nodule, suspicious for primary bronchogenic adenocarcinoma. Consider percutaneous needle biopsy versus continued followup by CT. No evidence of metastatic disease within the neck, abdomen, or pelvis. Electronically Signed   By: Earle Gell M.D.   On: 10/15/2016 17:27    ASSESSMENT: 82 y.o. [place] woman with bilateral breast cancer, as follows:  (1) status post right breastt overlapping sites skin biopsy 11/21/2014 for a clinical T4 N1, stage IIIB invasive ductal carcinoma, estrogen and progesterone receptor positive, HER-2 amplified  (2) status post 2 separate left breast upper outer quadrant biopsies and one left axillary lymph node biopsy 12/04/2014, all positive for a clinical mT1c N1, stage IIA invasive lobular carcinoma, estrogen receptor positive, progesterone receptor variable, with MIB-1 between 10 and 40%, and no HER-2 amplification  (3) neoadjuvant anastrozole started 12/14/2014. Discontinued with progression by repeat left breast US May 2017  (4) definitive surgery to consist of bilateral  mastectomies without formal axillary dissection: Postponed  METASTATIC DISEASE: March 2016 (5) PET scan 12/19/2014 shows in addition to the bilateral breast and bilateral axillary masses, a lesion at the T11 vertebral body, showing an irregular lucency on prior CT exam  (a) adjuvant radiationt to T10-T12: 04/11/2015 through 04/24/2015 to a total dose of 30 gray in 10 fractions  (6) consider anti-HER-2 treatment depending on response to antiestrogen therapy alone.    (7) osteoporosis with T score of -2.6 on bone scan obtained 02/19/2015.  (a) zolendronate started 03/20/2015, repeated every 12 weeks   (8) fulvestrant started 03/04/16  (a) patient refused adding palbociclib  (b) letrozole added 10/16/2016  PLAN: Dot is now nearly 2 years out from definitive diagnosis of metastatic breast cancer, and clinically she is very stable.  These restaging studies we just obtained are encouraging. In particular both axillae and the left breast looks stable to improved.  The one concern is the right breast cancer, which appears a bit more active. It is minimally increased in size, but there is a very small but active retropectoral lymph node that is new and the mass itself in the right breast has a very elevated SUV.  We considered all the options including continuing as as, adding a second anti-estrogen, adding palbociclib, or adding trastuzumab. After much discussion we decided we would give adding letrozole a try. She tolerated anastrozole well before and she should be able to obtain this Saturday good price.  This means we will be restaging her in approximately 3 months. If there has been further growth of the cancer on  the left side we will most likely add trastuzumab. I think she would tolerate that better than the eye brands and it will be much less of a concern for her than taking pills on and off and worrying about [place]. Of course we would have to worry about her heart and we would need an echo  before we start  All this was discussed today. She is starting letrozole now. She will call me with any problems that may develop from that medication. She continues on monthly shots and lab work and she is due for a zolendronate dose today as well. Her next visit with me will be in March and she will have a repeat PET scan prior to that visit   Hanah Moultry C, MD   10/17/2016 10:06 AM

## 2016-10-27 ENCOUNTER — Ambulatory Visit: Payer: Medicare Other

## 2016-11-09 ENCOUNTER — Ambulatory Visit (HOSPITAL_BASED_OUTPATIENT_CLINIC_OR_DEPARTMENT_OTHER): Payer: Medicare Other

## 2016-11-09 VITALS — BP 161/54 | HR 67 | Temp 98.0°F | Resp 18

## 2016-11-09 DIAGNOSIS — C50412 Malignant neoplasm of upper-outer quadrant of left female breast: Secondary | ICD-10-CM | POA: Diagnosis not present

## 2016-11-09 DIAGNOSIS — C7951 Secondary malignant neoplasm of bone: Secondary | ICD-10-CM | POA: Diagnosis present

## 2016-11-09 DIAGNOSIS — Z170 Estrogen receptor positive status [ER+]: Principal | ICD-10-CM

## 2016-11-09 MED ORDER — SODIUM CHLORIDE 0.9 % IV SOLN
3.3000 mg | Freq: Once | INTRAVENOUS | Status: AC
  Administered 2016-11-09: 3.3 mg via INTRAVENOUS
  Filled 2016-11-09: qty 4.13

## 2016-11-09 NOTE — Patient Instructions (Signed)
You received zometa today

## 2016-11-13 ENCOUNTER — Telehealth: Payer: Self-pay | Admitting: Oncology

## 2016-11-13 ENCOUNTER — Other Ambulatory Visit: Payer: Medicare Other

## 2016-11-13 ENCOUNTER — Ambulatory Visit: Payer: Medicare Other

## 2016-11-13 NOTE — Telephone Encounter (Signed)
Pt dtr called to r/s lab/inj appt to 1/29 at 1245 pm. Gave pt new appt date/time

## 2016-11-16 ENCOUNTER — Telehealth: Payer: Self-pay | Admitting: *Deleted

## 2016-11-16 ENCOUNTER — Other Ambulatory Visit: Payer: Medicare Other

## 2016-11-16 ENCOUNTER — Ambulatory Visit: Payer: Medicare Other

## 2016-11-16 NOTE — Telephone Encounter (Signed)
This RN spoke with pt's daughter per her return call.  Note pt has dementia with short term memory loss . Lauren Buchanan states post last injection her mother stated increased soreness " about 2 days after the shot "  Pt then had increased difficulty ambulating stating pain " was down both her legs "  Pain slowly subsided with pt able to ambulate at baseline ( unassisted ).  Currently only area of discomfort is in the lower left leg " from behind her knee to her foot "  Note pain concerns are difficult to assess due to pt cannot recall when she had pain - only can state pain when it is occurring.  Lauren Buchanan was concerned about the above due to pt due to have injections today " so should we go ahead and get them ?"  Per above this RN discussed with pt possible discomfort related to depot shots given- best to be seen by provider for concerns and then proceed to injections if no further concerns.  Appointments requested for this week.

## 2016-11-16 NOTE — Telephone Encounter (Signed)
This RN received VM from Triage stating pt called wanting to cancel appointments today . Per Triage requesting collaborative nurse to follow up.  This RN contacted pt - who stated " I have an appointment today ?"  Note pt has known dementia. This RN informed pt contact will be made with her daughter.  This RN called Shelly Rubenstein- pt's daughter and obtained VM. Message left requesting return call to discuss concerns and need to reschedule appointments appropriately.

## 2016-11-17 ENCOUNTER — Ambulatory Visit (HOSPITAL_BASED_OUTPATIENT_CLINIC_OR_DEPARTMENT_OTHER): Payer: Medicare Other

## 2016-11-17 ENCOUNTER — Ambulatory Visit (HOSPITAL_COMMUNITY)
Admission: RE | Admit: 2016-11-17 | Discharge: 2016-11-17 | Disposition: A | Discharge status: Home / Self Care | Payer: Medicare Other | Source: Ambulatory Visit | Attending: Adult Health | Admitting: Adult Health

## 2016-11-17 ENCOUNTER — Ambulatory Visit (HOSPITAL_BASED_OUTPATIENT_CLINIC_OR_DEPARTMENT_OTHER): Payer: Medicare Other | Admitting: Adult Health

## 2016-11-17 ENCOUNTER — Other Ambulatory Visit (HOSPITAL_BASED_OUTPATIENT_CLINIC_OR_DEPARTMENT_OTHER): Payer: Medicare Other

## 2016-11-17 ENCOUNTER — Encounter: Payer: Self-pay | Admitting: Adult Health

## 2016-11-17 VITALS — BP 145/41 | HR 70 | Temp 98.3°F | Resp 16

## 2016-11-17 VITALS — BP 142/43 | HR 87 | Temp 98.0°F | Resp 17 | Ht 60.0 in | Wt 121.2 lb

## 2016-11-17 DIAGNOSIS — Z170 Estrogen receptor positive status [ER+]: Principal | ICD-10-CM

## 2016-11-17 DIAGNOSIS — M79604 Pain in right leg: Secondary | ICD-10-CM

## 2016-11-17 DIAGNOSIS — C50412 Malignant neoplasm of upper-outer quadrant of left female breast: Secondary | ICD-10-CM | POA: Diagnosis not present

## 2016-11-17 DIAGNOSIS — M069 Rheumatoid arthritis, unspecified: Secondary | ICD-10-CM

## 2016-11-17 DIAGNOSIS — C50912 Malignant neoplasm of unspecified site of left female breast: Secondary | ICD-10-CM | POA: Diagnosis not present

## 2016-11-17 DIAGNOSIS — Z5111 Encounter for antineoplastic chemotherapy: Secondary | ICD-10-CM | POA: Diagnosis present

## 2016-11-17 DIAGNOSIS — C7951 Secondary malignant neoplasm of bone: Secondary | ICD-10-CM

## 2016-11-17 DIAGNOSIS — M79605 Pain in left leg: Secondary | ICD-10-CM

## 2016-11-17 DIAGNOSIS — C50811 Malignant neoplasm of overlapping sites of right female breast: Secondary | ICD-10-CM

## 2016-11-17 LAB — CBC WITH DIFFERENTIAL/PLATELET
BASO%: 0.2 % (ref 0.0–2.0)
BASOS ABS: 0 10*3/uL (ref 0.0–0.1)
EOS ABS: 0 10*3/uL (ref 0.0–0.5)
EOS%: 0.6 % (ref 0.0–7.0)
HCT: 40.9 % (ref 34.8–46.6)
HGB: 13.5 g/dL (ref 11.6–15.9)
LYMPH%: 8.4 % — AB (ref 14.0–49.7)
MCH: 29.9 pg (ref 25.1–34.0)
MCHC: 33 g/dL (ref 31.5–36.0)
MCV: 90.7 fL (ref 79.5–101.0)
MONO#: 0.6 10*3/uL (ref 0.1–0.9)
MONO%: 7.2 % (ref 0.0–14.0)
NEUT#: 7.1 10*3/uL — ABNORMAL HIGH (ref 1.5–6.5)
NEUT%: 83.6 % — ABNORMAL HIGH (ref 38.4–76.8)
Platelets: 243 10*3/uL (ref 145–400)
RBC: 4.51 10*6/uL (ref 3.70–5.45)
RDW: 14.1 % (ref 11.2–14.5)
WBC: 8.5 10*3/uL (ref 3.9–10.3)
lymph#: 0.7 10*3/uL — ABNORMAL LOW (ref 0.9–3.3)

## 2016-11-17 LAB — COMPREHENSIVE METABOLIC PANEL
ALT: 18 U/L (ref 0–55)
AST: 25 U/L (ref 5–34)
Albumin: 3.7 g/dL (ref 3.5–5.0)
Alkaline Phosphatase: 97 U/L (ref 40–150)
Anion Gap: 9 mEq/L (ref 3–11)
BUN: 22.1 mg/dL (ref 7.0–26.0)
CHLORIDE: 103 meq/L (ref 98–109)
CO2: 28 meq/L (ref 22–29)
Calcium: 9.5 mg/dL (ref 8.4–10.4)
Creatinine: 1 mg/dL (ref 0.6–1.1)
EGFR: 49 mL/min/{1.73_m2} — AB (ref >90)
GLUCOSE: 123 mg/dL (ref 70–140)
POTASSIUM: 4.6 meq/L (ref 3.5–5.1)
SODIUM: 139 meq/L (ref 136–145)
Total Bilirubin: 0.81 mg/dL (ref 0.20–1.20)
Total Protein: 6.9 g/dL (ref 6.4–8.3)

## 2016-11-17 MED ORDER — FULVESTRANT 250 MG/5ML IM SOLN
500.0000 mg | Freq: Once | INTRAMUSCULAR | Status: AC
  Administered 2016-11-17: 500 mg via INTRAMUSCULAR
  Filled 2016-11-17: qty 10

## 2016-11-17 NOTE — Progress Notes (Addendum)
[place]  [phone] [phone]   ID: Lauren Buchanan DOB: December 30, 1924  MR#: [id number]  OXB#:[id number]  Patient Care Team: Lauren Cookey, MD as PCP - General (Family Medicine) Lauren Skates, MD as Consulting Physician (General Surgery) Lauren Cruel, MD as Consulting Physician (Oncology) Lauren Rudd, MD as Consulting Physician (Radiation Oncology) Lauren Germany, RN as Registered Nurse Lauren Kaufmann, RN as Registered Nurse PCP: Lauren Man, MD GYN: OTHER MD: Lauren Sewer MD, Lauren Grayer MD, Lauren Bailey MD, Lauren Bookbinder MD  CHIEF COMPLAINT: bilateral estrogen receptor positive breast cancer, metastatic to bone  CURRENT TREATMENT: fulvestrant, zolendronate, letrozole  NOTE: Patient requests her daughter Lauren Buchanan be contacted regarding all appointments and procedures. Lauren Buchanan can be reached at [phone]  INTERVAL HISTORY: Lauren Buchanan returns today before receiving her Fulvestrant with a couple of concerns regarding pain in her posterior legs and in her right hsoulder for about three weeks.  The pain is intermittent and is described as a soreness.  No numbness or tingling.  She last received Zometa about 1 week ago.  She denies focal weakness, numbness, difficulty walking, fevers, chills, nausea vomiting, or any other new pain.  They tell me her appetite is good.  She does take naproxen bid and has tramadol prn.  REVIEW OF SYSTEMS: A 14 point ROS was reviewed and is negative except what is mentioned above.  BREAST CANCER HISTORY: From the original intake note:  "Lauren Buchanan"  Has been aware of a sore right nipple for the last 6-8 months. She attributed it to her washing machine, which rubs that area when she uses it. On  11/21/2014 she saw Dr. 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 She mentioned the chest wall problem and Dr. Ubaldo Glassing obtained to skin biopsies 1 from the right inferior central chest and the other one of  from the right mid medial chest.  The second of these showed a nodular basal cell carcinoma. The first one however showed invasive carcinoma consistent with a breast primary. This tumor was estrogen receptor positive at 100%, progesterone receptor positive at 36%, both with strong staining intensity, and HER-2 was amplified , the signals ratio being 6.2. [name] [id number])  On 12/06/2014 the patient underwent bilateral diagnostic mammography with bilateral ultrasonography. Breast density was category B. There was bilateral nipple retraction. In the left breast upper outer quadrant there was a spiculated mass noted by mammography with a second spiculated mass in the lower inner quadrant. By ultrasound there was a 1.6 cm hypoechoic lesion at the 1:30 o'clock position in the left breast, and a second mass more medially measuring 0.9 cm. Also at the 8:00 position in the left breast 2 cm from the nipple there was a 1.3 cm mass. Ultrasound of the left axilla showed an enlarged lymph node with fatty hilum replacement, measuring 1.4 cm.  In the right breast mammography showed a cluster of indeterminate microcalcifications in the upper outer quadrant and a retroareolar mass, which bile does sound measured 2.9 cm. Ultrasound of the right axilla showed a right axilla lymph node measuring 1.4 cm.  On 12/06/2014 the patient underwent needle core biopsy of 2 separate masses in the left breast (at 1:00 and 8:00) as well as the enlarged axillary lymph node. All 3 were positive for an invasive lobular carcinoma (E-cadherin negative). All 3 tumors were estrogen receptor positive with strong staining intensity (between 87% and 100%). The lymph node was progesterone receptor negative, the other  2 masses being progesterone receptor positive at 9% and at 76%. This cancer was reported to be HER-2 positive at conference today, though I do not have the ratios at hand.  The patient is not an MRI candidate because she has a permanent  pacemaker in place. Her subsequent history is as detailed below    PAST MEDICAL HISTORY: Past Medical History:  Diagnosis Date  . Asthma   . Breast cancer ([place]) 12/08/2014   Bilateral  . Cancer of central portion of female breast ([facility]) 12/04/2014  . Cancer of central portion of female breast ([facility]) 12/04/2014  . Chest pain   . Complete heart block ([place]) 1999   s/p PPM by Dr Olevia Perches, most recent generator change 2009  . COPD (chronic obstructive pulmonary disease) ([facility])   . Diverticulosis   . DJD (degenerative joint disease)   . Esophageal stricture   . GERD (gastroesophageal reflux disease)   . Giant cell arteritis ([facility])   . Goiter   . Headache(784.0)   . Heart murmur   . Hemorrhoids   . Hiatal hernia   . History of general anesthesia complication   . IBS (irritable bowel syndrome)   . Pacemaker   . Pneumonia   . Renal cyst   . S/P radiation therapy 04/24/15 completed   T-11  . Wears glasses     PAST SURGICAL HISTORY: Past Surgical History:  Procedure Laterality Date  . ANAL FISSURE REPAIR    . ARTERY BIOPSY Left 01/03/2013   Procedure: BIOPSY LEFT TEMPORAL ARTERY;  Surgeon: Ascencion Dike, MD;  Location: [facility];  Service: ENT;  Laterality: Left;  . BREAST BIOPSY Right   . CHOLECYSTECTOMY    . COLONOSCOPY    . FRACTURE SURGERY     fx lt wrist  . hemorrhoidectomy    . left lower lobectomy for brochiectasis  1950  . MOUTH SURGERY    . PACEMAKER INSERTION  1999   Gen change ([facility]) by Dr Olevia Perches 2009  . RECTAL POLYPECTOMY    . RENAL CYST EXCISION    . TONSILLECTOMY      FAMILY HISTORY Family History  Problem Relation Age of Onset  . Heart disease Mother   . Breast cancer Mother   . Stomach cancer Father   . Alcohol abuse Father   . Seizures Son   . Thyroid disease Sister    the patient's father died at the age of 37, with stomach cancer. The patient's mother died at the age of 105 from a myocardial infarction. She had been diagnosed with breast cancer  in her 74s. The patient had no brothers, 2 sisters. There is no other history of breast or ovarian cancer in the family to the patient's knowledge  GYNECOLOGIC HISTORY:  No LMP recorded. Patient is postmenopausal. Menarche age 89, first live birth age 43. The patient is GX P4. She underwent menopause in her late 24s. She did not take hormone replacement.  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 She is a widow, lives by herself, with no pets. Daughter Wendy Poet lives in [place] where she is Mudlogger of a preschool and day care. Son Laelynn Blizzard lives in [place] where he owns a furniture to sign store. Daughter Shelly Rubenstein lives in [place]. She owns a Chiropractor. Son Randall Hiss is physically and mentally disabled and lives in a group home in [place]. The patient has 7 grandchildren and 2 great-grandchildren. She  attends a local [employer]    ADVANCED DIRECTIVES: In place; the patient's daughter Elita Quick is her healthcare power of attorney. She can be reached at [phone]   HEALTH MAINTENANCE: Social History  Substance Use Topics  . Smoking status: Never Smoker  . Smokeless tobacco: Never Used  . Alcohol use Not on file     Comment: Occasional      Colonoscopy:  PAP:  Bone density:02/19/2015, osteoporosis  Lipid panel:  Allergies  Allergen Reactions  . Morphine And Related Nausea Only  . Amoxicillin-Pot Clavulanate Other (See Comments)    Unknown     Current Outpatient Prescriptions  Medication Sig Dispense Refill  . Calcium Citrate-Vitamin D (CITRACAL PETITES/VITAMIN D) 200-250 MG-UNIT TABS Take 1 tablet by mouth daily.      Marland Kitchen HYDROcodone-acetaminophen (NORCO/VICODIN) 5-325 MG tablet Take 1 tablet by mouth every 4 (four) hours as needed. 60 tablet 0  . letrozole (FEMARA) 2.5 MG tablet Take 1 tablet (2.5 mg total) by mouth daily. 90 tablet 4  . metoprolol succinate (TOPROL-XL) 100 MG 24 hr tablet TAKE 1/2 (ONE-HALF) TABLET BY MOUTH  EVERY MORNING. Take with or immediately following a meal. 45 tablet 3  . naproxen sodium (ANAPROX) 220 MG tablet Take 220 mg by mouth 2 (two) times daily with a meal.     . predniSONE (DELTASONE) 5 MG tablet Take 5 mg by mouth daily with breakfast. Reported on 11/19/2015    . Probiotic Product (PROBIOTIC DAILY PO) Take 1 tablet by mouth daily. 1 Chew daily.    . traMADol (ULTRAM) 50 MG tablet Take 1 tablet (50 mg total) by mouth every 6 (six) hours as needed for moderate pain or severe pain. Reported on 11/19/2015 30 tablet 3  . TURMERIC PO Take 1 capsule by mouth daily.    . vitamin C (ASCORBIC ACID) 500 MG tablet Take 500 mg by mouth daily.       No current facility-administered medications for this visit.     OBJECTIVE: Elderly white woman In no acute distress Vitals:   11/17/16 1426  BP: (!) 142/43  Pulse: 87  Resp: 17  Temp: 98 F (36.7 C)     Body mass index is 23.67 kg/m.    ECOG FS:1 - Symptomatic but completely ambulatory  GENERAL: Patient is a well appearing older female in no acute distress HEENT:  Sclerae anicteric.  Oropharynx clear and moist. No ulcerations or evidence of oropharyngeal candidiasis. Neck is supple.  NODES:  No cervical, supraclavicular, or axillary lymphadenopathy palpated.  BREAST EXAM:  Deferred. LUNGS:  Clear to auscultation bilaterally.  No wheezes or rhonchi. HEART:  Regular rate and rhythm. No murmur appreciated. ABDOMEN:  Soft, nontender.  Positive, normoactive bowel sounds. No organomegaly palpated. MSK:  No focal spinal tenderness to palpation. Full range of motion bilaterally in the upper extremities.  No SI joint tenderness, no sciatic notch tenderness, no tenderness along bilateral greater trochanters. EXTREMITIES:  No peripheral edema.  Varicosities noted in bilateral lower extremities, particularly posterior to the knee (where she notes her pain).  2+ DP and PT pulses.   SKIN:  Clear with no obvious rashes or skin changes. No nail  dyscrasia. NEURO:  Nonfocal. Well oriented.  Appropriate affect.   LAB RESULTS:  CMP     Component Value Date/Time   NA 139 11/17/2016 1415   K 4.6 11/17/2016 1415   CL 104 03/07/2015 0925   CO2 28 11/17/2016 1415   GLUCOSE 123 11/17/2016 1415   BUN 22.1  11/17/2016 1415   CREATININE 1.0 11/17/2016 1415   CALCIUM 9.5 11/17/2016 1415   PROT 6.9 11/17/2016 1415   ALBUMIN 3.7 11/17/2016 1415   AST 25 11/17/2016 1415   ALT 18 11/17/2016 1415   ALKPHOS 97 11/17/2016 1415   BILITOT 0.81 11/17/2016 1415   GFRNONAA >60 03/07/2015 0925   GFRAA >60 03/07/2015 0925    INo results found for: SPEP, UPEP  Lab Results  Component Value Date   WBC 8.5 11/17/2016   NEUTROABS 7.1 (H) 11/17/2016   HGB 13.5 11/17/2016   HCT 40.9 11/17/2016   MCV 90.7 11/17/2016   PLT 243 11/17/2016      Chemistry      Component Value Date/Time   NA 139 11/17/2016 1415   K 4.6 11/17/2016 1415   CL 104 03/07/2015 0925   CO2 28 11/17/2016 1415   BUN 22.1 11/17/2016 1415   CREATININE 1.0 11/17/2016 1415      Component Value Date/Time   CALCIUM 9.5 11/17/2016 1415   ALKPHOS 97 11/17/2016 1415   AST 25 11/17/2016 1415   ALT 18 11/17/2016 1415   BILITOT 0.81 11/17/2016 1415       Lab Results  Component Value Date   LABCA2 20 04/01/2016    No components found for: HQION629  No results for input(s): INR in the last 168 hours.  Urinalysis    Component Value Date/Time   COLORURINE YELLOW 09/27/2014 1800   APPEARANCEUR CLEAR 09/27/2014 1800   LABSPEC 1.005 09/27/2014 1800   PHURINE 7.0 09/27/2014 1800   GLUCOSEU NEGATIVE 09/27/2014 1800   HGBUR NEGATIVE 09/27/2014 1800   HGBUR trace-intact 01/17/2010 0932   BILIRUBINUR NEGATIVE 09/27/2014 1800   BILIRUBINUR n 09/01/2011 1157   KETONESUR NEGATIVE 09/27/2014 1800   PROTEINUR NEGATIVE 09/27/2014 1800   UROBILINOGEN 0.2 09/27/2014 1800   NITRITE NEGATIVE 09/27/2014 1800   LEUKOCYTESUR TRACE (A) 09/27/2014 1800    STUDIES: No results  found.  ASSESSMENT: 83 y.o. [place] woman with bilateral breast cancer, as follows:  (1) status post right breastt overlapping sites skin biopsy 11/21/2014 for a clinical T4 N1, stage IIIB invasive ductal carcinoma, estrogen and progesterone receptor positive, HER-2 amplified  (2) status post 2 separate left breast upper outer quadrant biopsies and one left axillary lymph node biopsy 12/04/2014, all positive for a clinical mT1c N1, stage IIA invasive lobular carcinoma, estrogen receptor positive, progesterone receptor variable, with MIB-1 between 10 and 40%, and no HER-2 amplification  (3) neoadjuvant anastrozole started 12/14/2014. Discontinued with progression by repeat left breast US May 2017  (4) definitive surgery to consist of bilateral mastectomies without formal axillary dissection: Postponed  METASTATIC DISEASE: March 2016 (5) PET scan 12/19/2014 shows in addition to the bilateral breast and bilateral axillary masses, a lesion at the T11 vertebral body, showing an irregular lucency on prior CT exam  (a) adjuvant radiationt to T10-T12: 04/11/2015 through 04/24/2015 to a total dose of 30 gray in 10 fractions  (6) consider anti-HER-2 treatment depending on response to antiestrogen therapy alone.    (7) osteoporosis with T score of -2.6 on bone scan obtained 02/19/2015.  (a) zolendronate started 03/20/2015, repeated every 12 weeks   (8) fulvestrant started 03/04/16  (a) patient refused adding palbociclib  (b) letrozole added 10/16/2016  PLAN: Patient pain is likely multifactorial.  I have ordered a doppler to rule out DVT due to her varicosities and metastatic breast cancer.  She will have that done today at [facility] at 4.  She has h/o rheumatoid  arthritis, and she also recently received Zometa.  Her CMP is pending.  Will see what her electrolytes are once they are available and ensure their stability.  She will proceed with Fulvestrant today for her metastatic breast cancer.  We will get  this additional information and be in contact with her.  I reviewed this with the patient and her daughter.  They are in agreement with this approach.   I spent 20 minutes with patient and greater than 50% of appointment was counseling patient face to face and coordinating care.  Charlestine Massed, NP   11/17/2016 3:05 PM

## 2016-11-17 NOTE — Progress Notes (Signed)
VASCULAR LAB PRELIMINARY  PRELIMINARY  PRELIMINARY  PRELIMINARY  Bilateral lower extremity venous duplex completed.    Preliminary report:  Bilateral:  No evidence of DVT, superficial thrombosis, or Baker's Cyst.   Noell Shular, RVS 11/17/2016, 4:51 PM

## 2016-11-18 ENCOUNTER — Telehealth: Payer: Self-pay | Admitting: Adult Health

## 2016-11-18 NOTE — Telephone Encounter (Signed)
I called and spoke with patient daughter to wrap up the loose ends regarding her appointment yesterday.  Patient to continue using heating pads and daughter will monitor her pain to see how much it really is present and impacting patient.  [name] will keep track of this for the next two weeks and then call me.  I explained that the doppler was negative, and that it could be the Letrozole, since the pains started since the letrozole was started, however, the pain could have an element of her underlying arthritis along with peripheral vascular disease due to her multiple varicosities.  I recommended she get her mother some compression stockings and we will touch base in a couple of weeks.  She knows if in between now and then she need anything at all to reach out to me.   Charlestine Massed, NP

## 2016-11-27 ENCOUNTER — Encounter: Payer: Self-pay | Admitting: Internal Medicine

## 2016-11-27 DIAGNOSIS — I441 Atrioventricular block, second degree: Secondary | ICD-10-CM | POA: Diagnosis not present

## 2016-11-27 DIAGNOSIS — R001 Bradycardia, unspecified: Secondary | ICD-10-CM | POA: Diagnosis not present

## 2016-11-29 ENCOUNTER — Encounter (HOSPITAL_COMMUNITY): Payer: Self-pay | Admitting: Emergency Medicine

## 2016-11-29 ENCOUNTER — Emergency Department (HOSPITAL_COMMUNITY)
Admission: EM | Admit: 2016-11-29 | Discharge: 2016-11-29 | Disposition: A | Discharge status: Home / Self Care | Payer: Medicare Other | Attending: Emergency Medicine | Admitting: Emergency Medicine

## 2016-11-29 ENCOUNTER — Emergency Department (HOSPITAL_BASED_OUTPATIENT_CLINIC_OR_DEPARTMENT_OTHER): Admit: 2016-11-29 | Discharge: 2016-11-29 | Disposition: A | Discharge status: Home / Self Care | Payer: Medicare Other

## 2016-11-29 ENCOUNTER — Emergency Department (HOSPITAL_COMMUNITY): Payer: Medicare Other

## 2016-11-29 DIAGNOSIS — Z853 Personal history of malignant neoplasm of breast: Secondary | ICD-10-CM | POA: Insufficient documentation

## 2016-11-29 DIAGNOSIS — M79609 Pain in unspecified limb: Secondary | ICD-10-CM

## 2016-11-29 DIAGNOSIS — J449 Chronic obstructive pulmonary disease, unspecified: Secondary | ICD-10-CM | POA: Insufficient documentation

## 2016-11-29 DIAGNOSIS — Z950 Presence of cardiac pacemaker: Secondary | ICD-10-CM | POA: Diagnosis not present

## 2016-11-29 DIAGNOSIS — M79605 Pain in left leg: Secondary | ICD-10-CM | POA: Diagnosis not present

## 2016-11-29 DIAGNOSIS — Z79899 Other long term (current) drug therapy: Secondary | ICD-10-CM | POA: Diagnosis not present

## 2016-11-29 DIAGNOSIS — M25562 Pain in left knee: Secondary | ICD-10-CM | POA: Diagnosis not present

## 2016-11-29 DIAGNOSIS — R52 Pain, unspecified: Secondary | ICD-10-CM | POA: Diagnosis not present

## 2016-11-29 LAB — COMPREHENSIVE METABOLIC PANEL
ALBUMIN: 3.8 g/dL (ref 3.5–5.0)
ALT: 19 U/L (ref 14–54)
AST: 26 U/L (ref 15–41)
Alkaline Phosphatase: 69 U/L (ref 38–126)
Anion gap: 6 (ref 5–15)
BUN: 12 mg/dL (ref 6–20)
CO2: 30 mmol/L (ref 22–32)
CREATININE: 0.61 mg/dL (ref 0.44–1.00)
Calcium: 9.4 mg/dL (ref 8.9–10.3)
Chloride: 100 mmol/L — ABNORMAL LOW (ref 101–111)
GFR calc Af Amer: >60 mL/min (ref >60)
GLUCOSE: 100 mg/dL — AB (ref 65–99)
Potassium: 4.1 mmol/L (ref 3.5–5.1)
Sodium: 136 mmol/L (ref 135–145)
Total Bilirubin: 1.3 mg/dL — ABNORMAL HIGH (ref 0.3–1.2)
Total Protein: 6.8 g/dL (ref 6.5–8.1)

## 2016-11-29 LAB — URINALYSIS, ROUTINE W REFLEX MICROSCOPIC
BACTERIA UA: NONE SEEN
Bilirubin Urine: NEGATIVE
GLUCOSE, UA: NEGATIVE mg/dL
KETONES UR: NEGATIVE mg/dL
Leukocytes, UA: NEGATIVE
Nitrite: NEGATIVE
PH: 7 (ref 5.0–8.0)
Protein, ur: NEGATIVE mg/dL
RBC / HPF: NONE SEEN RBC/hpf (ref 0–5)
SPECIFIC GRAVITY, URINE: 1.002 — AB (ref 1.005–1.030)
SQUAMOUS EPITHELIAL / LPF: NONE SEEN

## 2016-11-29 LAB — CBC WITH DIFFERENTIAL/PLATELET
Basophils Absolute: 0 10*3/uL (ref 0.0–0.1)
Basophils Relative: 0 %
EOS ABS: 0 10*3/uL (ref 0.0–0.7)
EOS PCT: 0 %
HCT: 40.4 % (ref 36.0–46.0)
Hemoglobin: 13.4 g/dL (ref 12.0–15.0)
LYMPHS ABS: 0.7 10*3/uL (ref 0.7–4.0)
LYMPHS PCT: 7 %
MCH: 29.6 pg (ref 26.0–34.0)
MCHC: 33.2 g/dL (ref 30.0–36.0)
MCV: 89.4 fL (ref 78.0–100.0)
MONO ABS: 0.6 10*3/uL (ref 0.1–1.0)
MONOS PCT: 6 %
Neutro Abs: 8.5 10*3/uL — ABNORMAL HIGH (ref 1.7–7.7)
Neutrophils Relative %: 87 %
PLATELETS: 270 10*3/uL (ref 150–400)
RBC: 4.52 MIL/uL (ref 3.87–5.11)
RDW: 13.8 % (ref 11.5–15.5)
WBC: 9.8 10*3/uL (ref 4.0–10.5)

## 2016-11-29 LAB — LIPASE, BLOOD: LIPASE: 29 U/L (ref 11–51)

## 2016-11-29 MED ORDER — ONDANSETRON 4 MG PO TBDP
4.0000 mg | ORAL_TABLET | Freq: Once | ORAL | Status: AC
  Administered 2016-11-29: 4 mg via ORAL
  Filled 2016-11-29: qty 1

## 2016-11-29 MED ORDER — HYDROCODONE-ACETAMINOPHEN 5-325 MG PO TABS
2.0000 | ORAL_TABLET | Freq: Once | ORAL | Status: AC
  Administered 2016-11-29: 2 via ORAL
  Filled 2016-11-29: qty 2

## 2016-11-29 MED ORDER — HYDROCODONE-ACETAMINOPHEN 5-325 MG PO TABS: 1.0000 | ORAL_TABLET | ORAL | 0 refills | Status: DC | PRN

## 2016-11-29 NOTE — ED Triage Notes (Signed)
[facility] c/o L leg pain x a few weeks however has increased x2-3 days. Per pt more difficult to ambulate than usual. Denies injury/trauma.

## 2016-11-29 NOTE — ED Notes (Signed)
US at bedside

## 2016-11-29 NOTE — Progress Notes (Signed)
VASCULAR LAB PRELIMINARY  PRELIMINARY  PRELIMINARY  PRELIMINARY  Left lower extremity venous duplex completed.    Preliminary report:  There is no DVT or SVT noted in the left lower extremity.  Gave report to Dr. Rex Kras.  Lauren Buchanan, RVT 11/29/2016, 8:10 PM

## 2016-11-29 NOTE — ED Provider Notes (Signed)
[facility] DEPT Provider Note   CSN: [id number] Arrival date & time: 11/29/16 1615     History    Chief Complaint  Patient presents with  . Leg Pain     HPI Lauren Buchanan is a 83 y.o. female.  83yo F w/ metastatic breast cancer, rheumatoid arthritis on chronic steroids, COPD, pacemaker who p/w L leg pain. The patient reports several weeks of atraumatic left posterior leg pain near her knee and distal thigh. She was evaluated on 1/30 and had an ultrasound that was negative for DVT. Her pain has worsened especially over the past 2 days and she has had difficulty walking because the pain is so severe. She denies any leg swelling or skin changes. No fevers. She has been using ice and heat with no relief. She does report an episode of vomiting last night and this morning which is unusual for her. She denies any associated abdominal pain, diarrhea, or other associated symptoms. No chest pain or shortness of breath. No history of blood clots. No hip pain.   Past Medical History:  Diagnosis Date  . Asthma   . Breast cancer ([place]) 12/08/2014   Bilateral  . Cancer of central portion of female breast ([place]) 12/04/2014  . Cancer of central portion of female breast ([place]) 12/04/2014  . Chest pain   . Complete heart block ([place]) 1999   s/p PPM by Dr Olevia Perches, most recent generator change 2009  . COPD (chronic obstructive pulmonary disease) ([place])   . Diverticulosis   . DJD (degenerative joint disease)   . Esophageal stricture   . GERD (gastroesophageal reflux disease)   . Giant cell arteritis ([place])   . Goiter   . Headache(784.0)   . Heart murmur   . Hemorrhoids   . Hiatal hernia   . History of general anesthesia complication   . IBS (irritable bowel syndrome)   . Pacemaker   . Pneumonia   . Renal cyst   . S/P radiation therapy 04/24/15 completed   T-11  . Wears glasses      Patient Active Problem List   Diagnosis Date Noted  . Cancer of left lung ([place]) 10/16/2016  . Cancer of  overlapping sites of right female breast ([place]) 04/28/2016  . Breast cancer metastasized to bone ([place]) 03/20/2015  . Osteoporosis 02/27/2015  . Breast cancer of upper-outer quadrant of left female breast ([place]) 12/12/2014  . Hepatic steatosis 12/12/2014  . Diarrhea 08/02/2014  . Giant cell arteritis ([place]) 06/02/2013  . Dehydration with hyponatremia 02/28/2013  . Transaminitis 02/28/2013  . 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 SIRS (systemic inflammatory response syndrome) ([place]) 02/27/2013  . Healthcare-associated pneumonia 02/27/2013  . Temporal arteritis ([place]) 02/27/2013  . Blurred vision, bilateral 12/13/2012  . Other malaise and fatigue 12/13/2012  . Bronchiectasis with acute exacerbation ([place]) 03/30/2012  . Bronchiectasis without acute exacerbation ([place]) 01/01/2012  . MEMORY LOSS 08/07/2010  . SVT/ PSVT/ PAT 09/20/2009  . COPD with chronic bronchitis ([place]) 08/21/2009  . PLANTAR FASCIITIS, BILATERAL 12/07/2008  . AV BLOCK, COMPLETE 09/07/2008  . PACEMAKER, PERMANENT 09/07/2008  . RHINITIS, ALLERGIC NEC 05/13/2007  . GOITER NOS 05/12/2007  . IBS 05/12/2007  . DEGENERATIVE JOINT DISEASE 05/12/2007    Past Surgical History:  Procedure Laterality Date  . ANAL FISSURE REPAIR    . ARTERY BIOPSY Left 01/03/2013   Procedure: BIOPSY LEFT TEMPORAL ARTERY;  Surgeon: Ascencion Dike, MD;  Location: [facility];  Service: ENT;  Laterality:  Left;  . BREAST BIOPSY Right   . CHOLECYSTECTOMY    . COLONOSCOPY    . FRACTURE SURGERY     fx lt wrist  . hemorrhoidectomy    . left lower lobectomy for brochiectasis  1950  . MOUTH SURGERY    . PACEMAKER INSERTION  1999   Gen change ([facility]) by Dr Olevia Perches 2009  . RECTAL POLYPECTOMY    . RENAL CYST EXCISION    . TONSILLECTOMY      OB History    No data available        Home Medications    Prior to Admission medications   Medication Sig Start Date End Date Taking? Authorizing Provider  Calcium  Citrate-Vitamin D (CITRACAL PETITES/VITAMIN D) 200-250 MG-UNIT TABS Take 1 tablet by mouth daily.     Yes Historical Provider, MD  letrozole (FEMARA) 2.5 MG tablet Take 1 tablet (2.5 mg total) by mouth daily. 10/16/16  Yes Chauncey Cruel, MD  metoprolol succinate (TOPROL-XL) 100 MG 24 hr tablet TAKE 1/2 (ONE-HALF) TABLET BY MOUTH EVERY MORNING. Take with or immediately following a meal. 07/01/16  Yes Thompson Grayer, MD  predniSONE (DELTASONE) 5 MG tablet Take 5 mg by mouth daily with breakfast. Reported on 11/19/2015   Yes Historical Provider, MD  traMADol (ULTRAM) 50 MG tablet Take 1 tablet (50 mg total) by mouth every 6 (six) hours as needed for moderate pain or severe pain. Reported on 11/19/2015 05/27/16  Yes Chauncey Cruel, MD  TURMERIC PO Take 1 capsule by mouth daily.   Yes Historical Provider, MD  HYDROcodone-acetaminophen (NORCO/VICODIN) 5-325 MG tablet Take 1-2 tablets by mouth every 4 (four) hours as needed for severe pain. 11/29/16   Sharlett Iles, MD      Family History  Problem Relation Age of Onset  . Heart disease Mother   . Breast cancer Mother   . Stomach cancer Father   . Alcohol abuse Father   . Seizures Son   . Thyroid disease Sister      Social History  Substance Use Topics  . Smoking status: Never Smoker  . Smokeless tobacco: Never Used  . Alcohol use Not on file     Comment: Occasional      Allergies     Morphine and related and Amoxicillin-pot clavulanate    Review of Systems  10 Systems reviewed and are negative for acute change except as noted in the HPI.   Physical Exam Updated Vital Signs BP 124/82 (BP Location: Right Arm)   Pulse 68   Temp 98.5 F (36.9 C)   Resp 18   Ht '5\' 1"'$  (1.549 m)   Wt 120 lb (54.4 kg)   SpO2 95%   BMI 22.67 kg/m   Physical Exam  Constitutional: She is oriented to person, place, and time. She appears well-developed and well-nourished. No distress.  HENT:  Head: Normocephalic and atraumatic.  Moist  mucous membranes  Eyes: Conjunctivae are normal. Pupils are equal, round, and reactive to light.  Neck: Neck supple.  Cardiovascular: Normal rate, regular rhythm, normal heart sounds and intact distal pulses.   No murmur heard. Pulmonary/Chest: Effort normal and breath sounds normal.  Abdominal: Soft. Bowel sounds are normal. She exhibits no distension. There is no tenderness.  Musculoskeletal: She exhibits no edema or deformity.  Mild tenderness of posterior distal L thigh and behind L knee with normal ROM knee joint, no joint swelling, warmth, or redness; no tenderness along femur or tib/fib;   Neurological: She  is alert and oriented to person, place, and time. No sensory deficit.  Fluent speech Normal strength BLE  Skin: Skin is warm and dry.  Minor linear erythema in crease of posterior L knee  Psychiatric: She has a normal mood and affect. Judgment normal.  Nursing note and vitals reviewed.     ED Treatments / Results  Labs (all labs ordered are listed, but only abnormal results are displayed) Labs Reviewed  COMPREHENSIVE METABOLIC PANEL - Abnormal; Notable for the following:       Result Value   Chloride 100 (*)    Glucose, Bld 100 (*)    Total Bilirubin 1.3 (*)    All other components within normal limits  CBC WITH DIFFERENTIAL/PLATELET - Abnormal; Notable for the following:    Neutro Abs 8.5 (*)    All other components within normal limits  URINALYSIS, ROUTINE W REFLEX MICROSCOPIC - Abnormal; Notable for the following:    Color, Urine COLORLESS (*)    Specific Gravity, Urine 1.002 (*)    Hgb urine dipstick SMALL (*)    All other components within normal limits  LIPASE, BLOOD     EKG  EKG Interpretation  Date/Time:    Ventricular Rate:    PR Interval:    QRS Duration:   QT Interval:    QTC Calculation:   R Axis:     Text Interpretation:           Radiology Dg Knee Complete 4 Views Left  Result Date: 11/29/2016 CLINICAL DATA:  Diffuse knee pain  EXAM: LEFT KNEE - COMPLETE 4+ VIEW COMPARISON:  None. FINDINGS: No fracture or dislocation is seen. The joint spaces are preserved. Visualized soft tissues are within normal limits. No definite suprapatellar knee joint effusion. IMPRESSION: Negative. Electronically Signed   By: Julian Hy M.D.   On: 11/29/2016 17:39    Procedures Procedures (including critical care time) Procedures  Medications Ordered in ED  Medications  HYDROcodone-acetaminophen (NORCO/VICODIN) 5-325 MG per tablet 2 tablet (2 tablets Oral Given 11/29/16 1718)  ondansetron (ZOFRAN-ODT) disintegrating tablet 4 mg (4 mg Oral Given 11/29/16 1717)     Initial Impression / Assessment and Plan / ED Course  I have reviewed the triage vital signs and the nursing notes.  Pertinent labs & imaging results that were available during my care of the patient were reviewed by me and considered in my medical decision making (see chart for details).     Pt w/ several weeks of worsening left posterior leg pain, no trauma or swelling/foreign/skin changes. She was neurovascularly intact distally with normal strength and sensation of her foot. She had no swelling or effusion of her knee. She had mild erythema in the crease of her posterior knee which may have been from heating pad but no evidence of cellulitis. Obtained repeat US to r/u DVT as well as plain films given h/o RA. Also obtained above lab work because of reports of vomiting. Gave the patient Vicodin and Zofran.  All labwork was unremarkable and showed normal WBC count, no evidence of infection. Plain films negative. Ultrasound is negative for DVT. Given the chronicity of the patient's pain and her normal lab work here as well as no abnormalities on physical exam, I feel that infectious process is very unlikely. She also seems very unlikely to have a vascular problem given her normal distal pulses, no smoking hx. Instructed to f/u with oncologist tomorrow regarding pain. Also  provided with orthopedics follow-up information. There is no case Freight forwarder  here at this hour but I have left a voicemail to assist in getting a walker at home. Provided with Vicodin to use and cautioned on use. Patient's daughter and patient voiced understanding of plan and she was discharged in satisfactory condition.  Final Clinical Impressions(s) / ED Diagnoses   Final diagnoses:  Left leg pain     Discharge Medication List as of 11/29/2016 10:53 PM         Sharlett Iles, MD 11/30/16 8850

## 2016-11-29 NOTE — ED Notes (Signed)
Bed: WA02 Expected date:  Expected time:  Means of arrival:  Comments: 83 yo leg pain

## 2016-12-02 DIAGNOSIS — Z6824 Body mass index (BMI) 24.0-24.9, adult: Secondary | ICD-10-CM | POA: Diagnosis not present

## 2016-12-02 DIAGNOSIS — M25562 Pain in left knee: Secondary | ICD-10-CM | POA: Diagnosis not present

## 2016-12-02 DIAGNOSIS — M810 Age-related osteoporosis without current pathological fracture: Secondary | ICD-10-CM | POA: Diagnosis not present

## 2016-12-02 DIAGNOSIS — M316 Other giant cell arteritis: Secondary | ICD-10-CM | POA: Diagnosis not present

## 2016-12-02 DIAGNOSIS — M0609 Rheumatoid arthritis without rheumatoid factor, multiple sites: Secondary | ICD-10-CM | POA: Diagnosis not present

## 2016-12-02 DIAGNOSIS — C50919 Malignant neoplasm of unspecified site of unspecified female breast: Secondary | ICD-10-CM | POA: Diagnosis not present

## 2016-12-03 ENCOUNTER — Ambulatory Visit (INDEPENDENT_AMBULATORY_CARE_PROVIDER_SITE_OTHER): Payer: Self-pay | Admitting: Physician Assistant

## 2016-12-06 ENCOUNTER — Other Ambulatory Visit: Payer: Self-pay | Admitting: Oncology

## 2016-12-07 ENCOUNTER — Encounter: Payer: Self-pay | Admitting: Internal Medicine

## 2016-12-11 ENCOUNTER — Ambulatory Visit (HOSPITAL_BASED_OUTPATIENT_CLINIC_OR_DEPARTMENT_OTHER): Payer: Medicare Other | Admitting: Oncology

## 2016-12-11 ENCOUNTER — Ambulatory Visit (HOSPITAL_BASED_OUTPATIENT_CLINIC_OR_DEPARTMENT_OTHER): Payer: Medicare Other

## 2016-12-11 ENCOUNTER — Other Ambulatory Visit (HOSPITAL_BASED_OUTPATIENT_CLINIC_OR_DEPARTMENT_OTHER): Payer: Medicare Other

## 2016-12-11 VITALS — BP 148/69 | HR 76 | Temp 97.9°F | Resp 17 | Wt 116.6 lb

## 2016-12-11 DIAGNOSIS — C50412 Malignant neoplasm of upper-outer quadrant of left female breast: Secondary | ICD-10-CM

## 2016-12-11 DIAGNOSIS — C7951 Secondary malignant neoplasm of bone: Secondary | ICD-10-CM

## 2016-12-11 DIAGNOSIS — C50912 Malignant neoplasm of unspecified site of left female breast: Secondary | ICD-10-CM

## 2016-12-11 DIAGNOSIS — Z170 Estrogen receptor positive status [ER+]: Secondary | ICD-10-CM | POA: Diagnosis not present

## 2016-12-11 DIAGNOSIS — C3412 Malignant neoplasm of upper lobe, left bronchus or lung: Secondary | ICD-10-CM

## 2016-12-11 DIAGNOSIS — C50811 Malignant neoplasm of overlapping sites of right female breast: Secondary | ICD-10-CM

## 2016-12-11 DIAGNOSIS — Z5111 Encounter for antineoplastic chemotherapy: Secondary | ICD-10-CM

## 2016-12-11 LAB — COMPREHENSIVE METABOLIC PANEL
ALT: 22 U/L (ref 0–55)
AST: 24 U/L (ref 5–34)
Albumin: 3.6 g/dL (ref 3.5–5.0)
Alkaline Phosphatase: 85 U/L (ref 40–150)
Anion Gap: 9 mEq/L (ref 3–11)
BUN: 22.3 mg/dL (ref 7.0–26.0)
CHLORIDE: 100 meq/L (ref 98–109)
CO2: 25 meq/L (ref 22–29)
Calcium: 9.6 mg/dL (ref 8.4–10.4)
Creatinine: 0.9 mg/dL (ref 0.6–1.1)
EGFR: 56 mL/min/{1.73_m2} — AB (ref >90)
GLUCOSE: 86 mg/dL (ref 70–140)
POTASSIUM: 4 meq/L (ref 3.5–5.1)
SODIUM: 135 meq/L — AB (ref 136–145)
Total Bilirubin: 1.47 mg/dL — ABNORMAL HIGH (ref 0.20–1.20)
Total Protein: 6.6 g/dL (ref 6.4–8.3)

## 2016-12-11 LAB — CBC WITH DIFFERENTIAL/PLATELET
BASO%: 0.1 % (ref 0.0–2.0)
Basophils Absolute: 0 10*3/uL (ref 0.0–0.1)
EOS%: 1 % (ref 0.0–7.0)
Eosinophils Absolute: 0.1 10*3/uL (ref 0.0–0.5)
HCT: 43 % (ref 34.8–46.6)
HGB: 14.5 g/dL (ref 11.6–15.9)
LYMPH%: 10.8 % — AB (ref 14.0–49.7)
MCH: 30.1 pg (ref 25.1–34.0)
MCHC: 33.7 g/dL (ref 31.5–36.0)
MCV: 89.4 fL (ref 79.5–101.0)
MONO#: 1.8 10*3/uL — AB (ref 0.1–0.9)
MONO%: 17.8 % — AB (ref 0.0–14.0)
NEUT#: 7.2 10*3/uL — ABNORMAL HIGH (ref 1.5–6.5)
NEUT%: 70.3 % (ref 38.4–76.8)
PLATELETS: 210 10*3/uL (ref 145–400)
RBC: 4.81 10*6/uL (ref 3.70–5.45)
RDW: 13.7 % (ref 11.2–14.5)
WBC: 10.2 10*3/uL (ref 3.9–10.3)
lymph#: 1.1 10*3/uL (ref 0.9–3.3)

## 2016-12-11 MED ORDER — FULVESTRANT 250 MG/5ML IM SOLN
500.0000 mg | Freq: Once | INTRAMUSCULAR | Status: AC
  Administered 2016-12-11: 500 mg via INTRAMUSCULAR
  Filled 2016-12-11: qty 10

## 2016-12-11 NOTE — Progress Notes (Signed)
[place]  [phone] [phone]   ID: Lauren Buchanan DOB: May 11, 1925  MR#: [id number]  PNT#:[id number]  Patient Care Team: Dorena Cookey, MD as PCP - General (Family Medicine) Fanny Skates, MD as Consulting Physician (General Surgery) Chauncey Cruel, MD as Consulting Physician (Oncology) [name], MD as Consulting Physician (Radiation Oncology) Rockwell Germany, RN as Registered Nurse Mauro Kaufmann, RN as Registered Nurse PCP: Joycelyn Man, MD GYN: OTHER MD: Danton Sewer MD, Thompson Grayer MD, Unice Bailey MD, Rolm Bookbinder MD  CHIEF COMPLAINT: bilateral estrogen receptor positive breast cancer, metastatic to bone  CURRENT TREATMENT: fulvestrant, zolendronate, letrozole  NOTE: Patient requests her daughter Lauren Buchanan be contacted regarding all appointments and procedures. Lauren Buchanan can be reached at [phone]  INTERVAL HISTORY: Lauren Buchanan returns today for follow-up of her breast cancer and possible lung cancer accompanied by her daughter. [name] continues on fulvestrant every 4 weeks. She tolerates this well, with no side effects that she is aware of. She also has been on letrozole now for 2 months. She does not have hot flashes or vaginal dryness associated with this medication. She has not developed the aches and pains that many patients can experience on  REVIEW OF SYSTEMS: Lauren Buchanan is having significant problems with her left knee. This has been evaluated and she was given a cane. There have been no falls. She walks to the dining room room daily, and that is a long way to go she says, but she does not otherwise exercise. Her prednisone was increased to 10 mg daily but so far she has seen no improvement in the knee problem. A detailed review of systems today was otherwise stable.  BREAST CANCER HISTORY: From the original intake note:  "Lauren Buchanan"  Has been aware of a sore right nipple for the last 6-8 months. She attributed it to her washing machine, which  rubs that area when she uses it. On  11/21/2014 she saw Dr. 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 She mentioned the chest wall problem and Dr. Ubaldo Glassing obtained to skin biopsies 1 from the right inferior central chest and the other one of from the right mid medial chest.  The second of these showed a nodular basal cell carcinoma. The first one however showed invasive carcinoma consistent with a breast primary. This tumor was estrogen receptor positive at 100%, progesterone receptor positive at 36%, both with strong staining intensity, and HER-2 was amplified , the signals ratio being 6.2. [name] [id number])  On 12/06/2014 the patient underwent bilateral diagnostic mammography with bilateral ultrasonography. Breast density was category B. There was bilateral nipple retraction. In the left breast upper outer quadrant there was a spiculated mass noted by mammography with a second spiculated mass in the lower inner quadrant. By ultrasound there was a 1.6 cm hypoechoic lesion at the 1:30 o'clock position in the left breast, and a second mass more medially measuring 0.9 cm. Also at the 8:00 position in the left breast 2 cm from the nipple there was a 1.3 cm mass. Ultrasound of the left axilla showed an enlarged lymph node with fatty hilum replacement, measuring 1.4 cm.  In the right breast mammography showed a cluster of indeterminate microcalcifications in the upper outer quadrant and a retroareolar mass, which bile does sound measured 2.9 cm. Ultrasound of the right axilla showed a right axilla lymph node measuring 1.4 cm.  On 12/06/2014 the patient underwent needle core biopsy of 2 separate masses in  the left breast (at 1:00 and 8:00) as well as the enlarged axillary lymph node. All 3 were positive for an invasive lobular carcinoma (E-cadherin negative). All 3 tumors were estrogen receptor positive with strong staining intensity (between 87% and 100%). The lymph node was progesterone  receptor negative, the other 2 masses being progesterone receptor positive at 9% and at 76%. This cancer was reported to be HER-2 positive at conference today, though I do not have the ratios at hand.  The patient is not an MRI candidate because she has a permanent pacemaker in place. Her subsequent history is as detailed below    PAST MEDICAL HISTORY: Past Medical History:  Diagnosis Date  . Asthma   . Breast cancer ([place]) 12/08/2014   Bilateral  . Cancer of central portion of female breast ([place]) 12/04/2014  . Cancer of central portion of female breast ([place]) 12/04/2014  . Chest pain   . Complete heart block ([place]) 1999   s/p PPM by Dr Olevia Perches, most recent generator change 2009  . COPD (chronic obstructive pulmonary disease) ([place])   . Diverticulosis   . DJD (degenerative joint disease)   . Esophageal stricture   . GERD (gastroesophageal reflux disease)   . Giant cell arteritis ([place])   . Goiter   . Headache(784.0)   . Heart murmur   . Hemorrhoids   . Hiatal hernia   . History of general anesthesia complication   . IBS (irritable bowel syndrome)   . Pacemaker   . Pneumonia   . Renal cyst   . S/P radiation therapy 04/24/15 completed   T-11  . Wears glasses     PAST SURGICAL HISTORY: Past Surgical History:  Procedure Laterality Date  . ANAL FISSURE REPAIR    . ARTERY BIOPSY Left 01/03/2013   Procedure: BIOPSY LEFT TEMPORAL ARTERY;  Surgeon: Ascencion Dike, MD;  Location: [facility];  Service: ENT;  Laterality: Left;  . BREAST BIOPSY Right   . CHOLECYSTECTOMY    . COLONOSCOPY    . FRACTURE SURGERY     fx lt wrist  . hemorrhoidectomy    . left lower lobectomy for brochiectasis  1950  . MOUTH SURGERY    . PACEMAKER INSERTION  1999   Gen change (SJM) by Dr Olevia Perches 2009  . RECTAL POLYPECTOMY    . RENAL CYST EXCISION    . TONSILLECTOMY      FAMILY HISTORY Family History  Problem Relation Age of Onset  . Heart disease Mother   . Breast cancer Mother   . Stomach  cancer Father   . Alcohol abuse Father   . Seizures Son   . Thyroid disease Sister    the patient's father died at the age of 42, with stomach cancer. The patient's mother died at the age of 19 from a myocardial infarction. She had been diagnosed with breast cancer in her 71s. The patient had no brothers, 2 sisters. There is no other history of breast or ovarian cancer in the family to the patient's knowledge  GYNECOLOGIC HISTORY:  No LMP recorded. Patient is postmenopausal. Menarche age 27, first live birth age 50. The patient is GX P4. She underwent menopause in her late 53s. She did not take hormone replacement.  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 She is a widow, lives by herself, with no pets. Daughter Wendy Poet lives in [place] where she is Mudlogger of a preschool and day care.  Son Ross Hefferan lives in [place] where he owns a furniture to sign store. Daughter Shelly Rubenstein lives in [place]. She owns a Chiropractor. Son Randall Hiss is physically and mentally disabled and lives in a group home in [place]. The patient has 7 grandchildren and 2 great-grandchildren. She attends a local [place]: [employer] place; the patient's daughter Lauren Buchanan is her healthcare power of attorney. She can be reached at [place]: Social History  Substance Use Topics  . Smoking status: Never Smoker  . Smokeless tobacco: Never Used  . Alcohol use 0.0 oz/week     Comment: Occasional      Colonoscopy:  PAP:  Bone density:02/19/2015, osteoporosis  Lipid panel:  Allergies  Allergen Reactions  . Morphine And Related Nausea Only  . Amoxicillin-Pot Clavulanate Other (See Comments)    Unknown     Current Outpatient Prescriptions  Medication Sig Dispense Refill  . Calcium Citrate-Vitamin D (CITRACAL PETITES/VITAMIN D) 200-250 MG-UNIT TABS Take 1 tablet by mouth daily.      Marland Kitchen HYDROcodone-acetaminophen (NORCO/VICODIN)  5-325 MG tablet Take 1-2 tablets by mouth every 4 (four) hours as needed for severe pain. 18 tablet 0  . letrozole (FEMARA) 2.5 MG tablet Take 1 tablet (2.5 mg total) by mouth daily. 90 tablet 4  . metoprolol succinate (TOPROL-XL) 100 MG 24 hr tablet TAKE 1/2 (ONE-HALF) TABLET BY MOUTH EVERY MORNING. Take with or immediately following a meal. 45 tablet 3  . predniSONE (DELTASONE) 5 MG tablet Take 10 mg by mouth daily with breakfast. Reported on 11/19/2015    . traMADol (ULTRAM) 50 MG tablet Take 1 tablet (50 mg total) by mouth every 6 (six) hours as needed for moderate pain or severe pain. Reported on 11/19/2015 30 tablet 3  . TURMERIC PO Take 1 capsule by mouth daily.     No current facility-administered medications for this visit.     OBJECTIVE: Elderly white woman Using a cane Vitals:   12/11/16 1116  BP: (!) 148/69  Pulse: 76  Resp: 17  Temp: 97.9 F (36.6 C)     Body mass index is 22.03 kg/m.    ECOG FS:1 - Symptomatic but completely ambulatory  Sclerae unicteric, pupils round and equal Oropharynx clear and moist-- no thrush or other lesions No cervical or supraclavicular adenopathy Lungs no rales or rhonchi Heart regular rate and rhythm Abd soft, nontender, positive bowel sounds MSK no focal spinal tenderness, no upper extremity lymphedema Neuro: nonfocal, well oriented, appropriate affect Breasts: The right breast mass is unchanged. The right axilla is benign. The left breast is unremarkable    LAB RESULTS:  CMP     Component Value Date/Time   NA 136 11/29/2016 1749   NA 139 11/17/2016 1415   K 4.1 11/29/2016 1749   K 4.6 11/17/2016 1415   CL 100 (L) 11/29/2016 1749   CO2 30 11/29/2016 1749   CO2 28 11/17/2016 1415   GLUCOSE 100 (H) 11/29/2016 1749   GLUCOSE 123 11/17/2016 1415   BUN 12 11/29/2016 1749   BUN 22.1 11/17/2016 1415   CREATININE 0.61 11/29/2016 1749   CREATININE 1.0 11/17/2016 1415   CALCIUM 9.4 11/29/2016 1749   CALCIUM 9.5 11/17/2016 1415    PROT 6.8 11/29/2016 1749   PROT 6.9 11/17/2016 1415   ALBUMIN 3.8 11/29/2016 1749   ALBUMIN 3.7 11/17/2016 1415   AST 26 11/29/2016 1749   AST 25 11/17/2016 1415   ALT 19 11/29/2016 1749  ALT 18 11/17/2016 1415   ALKPHOS 69 11/29/2016 1749   ALKPHOS 97 11/17/2016 1415   BILITOT 1.3 (H) 11/29/2016 1749   BILITOT 0.81 11/17/2016 1415   GFRNONAA >60 11/29/2016 1749   GFRAA >60 11/29/2016 1749    INo results found for: SPEP, UPEP  Lab Results  Component Value Date   WBC 10.2 12/11/2016   NEUTROABS 7.2 (H) 12/11/2016   HGB 14.5 12/11/2016   HCT 43.0 12/11/2016   MCV 89.4 12/11/2016   PLT 210 12/11/2016      Chemistry      Component Value Date/Time   NA 136 11/29/2016 1749   NA 139 11/17/2016 1415   K 4.1 11/29/2016 1749   K 4.6 11/17/2016 1415   CL 100 (L) 11/29/2016 1749   CO2 30 11/29/2016 1749   CO2 28 11/17/2016 1415   BUN 12 11/29/2016 1749   BUN 22.1 11/17/2016 1415   CREATININE 0.61 11/29/2016 1749   CREATININE 1.0 11/17/2016 1415      Component Value Date/Time   CALCIUM 9.4 11/29/2016 1749   CALCIUM 9.5 11/17/2016 1415   ALKPHOS 69 11/29/2016 1749   ALKPHOS 97 11/17/2016 1415   AST 26 11/29/2016 1749   AST 25 11/17/2016 1415   ALT 19 11/29/2016 1749   ALT 18 11/17/2016 1415   BILITOT 1.3 (H) 11/29/2016 1749   BILITOT 0.81 11/17/2016 1415       Lab Results  Component Value Date   LABCA2 20 04/01/2016    No components found for: CRSHI201  No results for input(s): INR in the last 168 hours.  Urinalysis    Component Value Date/Time   COLORURINE COLORLESS (A) 11/29/2016 1707   APPEARANCEUR CLEAR 11/29/2016 1707   LABSPEC 1.002 (L) 11/29/2016 1707   PHURINE 7.0 11/29/2016 1707   GLUCOSEU NEGATIVE 11/29/2016 1707   HGBUR SMALL (A) 11/29/2016 1707   HGBUR trace-intact 01/17/2010 0932   BILIRUBINUR NEGATIVE 11/29/2016 1707   BILIRUBINUR n 09/01/2011 1157   KETONESUR NEGATIVE 11/29/2016 1707   PROTEINUR NEGATIVE 11/29/2016 1707   UROBILINOGEN  0.2 09/27/2014 1800   NITRITE NEGATIVE 11/29/2016 1707   LEUKOCYTESUR NEGATIVE 11/29/2016 1707    STUDIES: Dg Knee Complete 4 Views Left  Result Date: 11/29/2016 CLINICAL DATA:  Diffuse knee pain EXAM: LEFT KNEE - COMPLETE 4+ VIEW COMPARISON:  None. FINDINGS: No fracture or dislocation is seen. The joint spaces are preserved. Visualized soft tissues are within normal limits. No definite suprapatellar knee joint effusion. IMPRESSION: Negative. Electronically Signed   By: Charline Bills M.D.   On: 11/29/2016 17:39    ASSESSMENT: 83 y.o. [place] woman with bilateral breast cancer, as follows:  (1) status post right breastt overlapping sites skin biopsy 11/21/2014 for a clinical T4 N1, stage IIIB invasive ductal carcinoma, estrogen and progesterone receptor positive, HER-2 amplified  (2) status post 2 separate left breast upper outer quadrant biopsies and one left axillary lymph node biopsy 12/04/2014, all positive for a clinical mT1c N1, stage IIA invasive lobular carcinoma, estrogen receptor positive, progesterone receptor variable, with MIB-1 between 10 and 40%, and no HER-2 amplification  (3) neoadjuvant anastrozole started 12/14/2014. Discontinued with progression by repeat left breast US May 2017  (4) definitive surgery to consist of bilateral mastectomies without formal axillary dissection: Postponed  METASTATIC DISEASE: March 2016 (5) PET scan 12/19/2014 shows in addition to the bilateral breast and bilateral axillary masses, a lesion at the T11 vertebral body, showing an irregular lucency on prior CT exam  (a) adjuvant radiationt  to T10-T12: 04/11/2015 through 04/24/2015 to a total dose of 30 gray in 10 fractions  (6) consider anti-HER-2 treatment depending on response to antiestrogen therapy alone.    (7) osteoporosis with T score of -2.6 on bone scan obtained 02/19/2015.  (a) zolendronate started 03/20/2015, repeated every 12 weeks   (8) fulvestrant started 03/04/16  (a)  patient refused adding palbociclib  (b) PET scan in 10/15/2016 showed mild increase in the right breast mass and increased size and activity of 3.2 cm of solid left lung nodule  (b) letrozole added 10/16/2016  (9) Doppler ultrasound both lower extremities 11/17/2016 or repeat 11/29/2016 negative  PLAN: Cadience is tolerating her treatments well and appears very stable by exam. I don't think the left knee problems she is having are out all related to her breast cancer.  We are continuing with fulvestrant and letrozole for now. She will see me again in a month and we will repeat the treatment at that time. In April however I will repeat a PET scan and reassess. At that time we will decide whether she needs stereotactic radiosurgery to the lung lesion and or surgery to the breast lesion  We can also consider everolimus/exemestane at that point  She has lost a little bit of weight as compared to last month. I encouraged her to not shy away from caloric foods. I also encouraged her to do in chair exercises regularly [facility]  [facility] and her if she goes through a little physical therapy for her knee and her daughter will try to arrange that for her.  Otherwise a note of call for any other problems that may develop for her next visit. Chauncey Cruel, MD   12/11/2016 11:34 AM

## 2016-12-11 NOTE — Patient Instructions (Signed)

## 2016-12-14 ENCOUNTER — Telehealth: Payer: Self-pay | Admitting: Family Medicine

## 2016-12-14 ENCOUNTER — Telehealth: Payer: Self-pay | Admitting: Adult Health

## 2016-12-14 DIAGNOSIS — R413 Other amnesia: Secondary | ICD-10-CM

## 2016-12-14 NOTE — Telephone Encounter (Signed)
Received call from Lauren Buchanan's daughter that the facility where her mom lives: [facility] wants Lauren Buchanan to move to assisted living due to her memory changes as they fear that she may be a danger to herself.  Daughter said that they did a brief memory screen (that she didn't know the name of) and she only got 6 answers out of 15 correct.  She wants a full evaluation by neurology and requested a referral.  I told her I'd be happy to place one and perhaps her supplemental insurance may cover it, however, patient will call PCP and get referral from them.    To call with any questions or concerns.     Lauren Massed, NP

## 2016-12-14 NOTE — Telephone Encounter (Signed)
° ° °  Daughter call to say they are moving Mom into assisted living and they are requesting that she see a neurologist   Clance Boll neurologist [address] [facility] [address] [place]

## 2016-12-15 ENCOUNTER — Other Ambulatory Visit: Payer: Self-pay | Admitting: Internal Medicine

## 2016-12-15 DIAGNOSIS — M25562 Pain in left knee: Secondary | ICD-10-CM

## 2016-12-15 NOTE — Telephone Encounter (Signed)
° ° ° °  They are requesting a referral to be sent

## 2016-12-16 ENCOUNTER — Encounter: Payer: Self-pay | Admitting: Internal Medicine

## 2016-12-16 ENCOUNTER — Ambulatory Visit (INDEPENDENT_AMBULATORY_CARE_PROVIDER_SITE_OTHER): Payer: Medicare Other | Admitting: Internal Medicine

## 2016-12-16 VITALS — BP 132/76 | HR 76 | Ht 60.5 in | Wt 119.0 lb

## 2016-12-16 DIAGNOSIS — I480 Paroxysmal atrial fibrillation: Secondary | ICD-10-CM | POA: Diagnosis not present

## 2016-12-16 DIAGNOSIS — I442 Atrioventricular block, complete: Secondary | ICD-10-CM

## 2016-12-16 LAB — CUP PACEART INCLINIC DEVICE CHECK
Brady Statistic RA Percent Paced: 70 %
Brady Statistic RV Percent Paced: 99 %
Date Time Interrogation Session: 20180228110528
Implantable Lead Special Function: HIGH
Implantable Lead Special Function: HIGH
Lead Channel Impedance Value: 463 Ohm
Lead Channel Pacing Threshold Amplitude: 0.5 V
Lead Channel Pacing Threshold Amplitude: 0.75 V
Lead Channel Pacing Threshold Pulse Width: 0.4 ms
Lead Channel Setting Pacing Amplitude: 2 V
MDC IDC LEAD IMPLANT DT: 19991013
MDC IDC LEAD IMPLANT DT: 19991013
MDC IDC LEAD LOCATION: 753859
MDC IDC LEAD LOCATION: 753860
MDC IDC MSMT BATTERY IMPEDANCE: 3300 Ohm
MDC IDC MSMT BATTERY VOLTAGE: 2.74 V
MDC IDC MSMT LEADCHNL RA IMPEDANCE VALUE: 339 Ohm
MDC IDC MSMT LEADCHNL RA SENSING INTR AMPL: 0.8 mV
MDC IDC MSMT LEADCHNL RV PACING THRESHOLD PULSEWIDTH: 0.4 ms
MDC IDC PG IMPLANT DT: 20091119
MDC IDC SET LEADCHNL RV PACING PULSEWIDTH: 0.4 ms
MDC IDC SET LEADCHNL RV SENSING SENSITIVITY: 4 mV
Pulse Gen Model: 5826
Pulse Gen Serial Number: 2233979

## 2016-12-16 NOTE — Patient Instructions (Signed)
Medication Instructions:  Your physician recommends that you continue on your current medications as directed. Please refer to the Current Medication list given to you today.   Labwork: None ordered   Testing/Procedures: None ordered   Follow-Up: Your physician wants you to follow-up in: 6 months with Chanetta Marshall, NP You will receive a reminder letter in the mail two months in advance. If you don't receive a letter, please call our office to schedule the follow-up appointment.   Any Other Special Instructions Will Be Listed Below (If Applicable).     If you need a refill on your cardiac medications before your next appointment, please call your pharmacy.

## 2016-12-16 NOTE — Progress Notes (Signed)
Electrophysiology Office Note   Date:  12/16/2016   ID:  Lauren Buchanan, DOB 1924/11/30, MRN [id number]  PCP:  Joycelyn Man, MD   Primary Electrophysiologist: Thompson Grayer, MD    Chief Complaint  Patient presents with  . Follow-up    Complete AVB     History of Present Illness: Lauren Buchanan is a 83 y.o. female who presents today for electrophysiology follow-up.   She has breast cancer for which she has elective to not have surgery or chemotherapy.  She has had some radiation.  She is doing reasonably well.  She lives at [facility].  She has had some leg pain for which she has workup ongoing. Today, she denies symptoms of palpitations, chest pain, shortness of breath, orthopnea, PND, lower extremity edema, claudication, dizziness, presyncope, syncope, bleeding, or neurologic sequela. The patient is tolerating medications without difficulties and is otherwise without complaint today.    Past Medical History:  Diagnosis Date  . Asthma   . Breast cancer ([place]) 12/08/2014   Bilateral  . Cancer of central portion of female breast ([place]) 12/04/2014  . Cancer of central portion of female breast ([place]) 12/04/2014  . Chest pain   . Complete heart block ([place]) 1999   s/p PPM by Dr Olevia Perches, most recent generator change 2009  . COPD (chronic obstructive pulmonary disease) ([place])   . Diverticulosis   . DJD (degenerative joint disease)   . Esophageal stricture   . GERD (gastroesophageal reflux disease)   . Giant cell arteritis ([place])   . Goiter   . Headache(784.0)   . Heart murmur   . Hemorrhoids   . Hiatal hernia   . History of general anesthesia complication   . IBS (irritable bowel syndrome)   . Pacemaker   . Pneumonia   . Renal cyst   . S/P radiation therapy 04/24/15 completed   T-11  . Wears glasses    Past Surgical History:  Procedure Laterality Date  . ANAL FISSURE REPAIR    . ARTERY BIOPSY Left 01/03/2013   Procedure: BIOPSY LEFT TEMPORAL ARTERY;  Surgeon: Ascencion Dike, MD;  Location: [facility];  Service: ENT;  Laterality: Left;  . BREAST BIOPSY Right   . CHOLECYSTECTOMY    . COLONOSCOPY    . FRACTURE SURGERY     fx lt wrist  . hemorrhoidectomy    . left lower lobectomy for brochiectasis  1950  . MOUTH SURGERY    . PACEMAKER INSERTION  1999   Gen change (SJM) by Dr Olevia Perches 2009  . RECTAL POLYPECTOMY    . RENAL CYST EXCISION    . TONSILLECTOMY       Current Outpatient Prescriptions  Medication Sig Dispense Refill  . Calcium Citrate-Vitamin D (CITRACAL PETITES/VITAMIN D) 200-250 MG-UNIT TABS Take 1 tablet by mouth daily.      Marland Kitchen HYDROcodone-acetaminophen (NORCO/VICODIN) 5-325 MG tablet Take 1-2 tablets by mouth every 4 (four) hours as needed for severe pain. 18 tablet 0  . letrozole (FEMARA) 2.5 MG tablet Take 1 tablet (2.5 mg total) by mouth daily. 90 tablet 4  . metoprolol succinate (TOPROL-XL) 100 MG 24 hr tablet TAKE 1/2 (ONE-HALF) TABLET BY MOUTH EVERY MORNING. Take with or immediately following a meal. 45 tablet 3  . predniSONE (DELTASONE) 5 MG tablet Take 10 mg by mouth daily with breakfast. Reported on 11/19/2015    . traMADol (ULTRAM) 50 MG tablet Take 1 tablet (50 mg total) by mouth every 6 (six) hours  as needed for moderate pain or severe pain. Reported on 11/19/2015 30 tablet 3  . TURMERIC PO Take 1 capsule by mouth daily.     No current facility-administered medications for this visit.     Allergies:   Morphine and related and Amoxicillin-pot clavulanate   Social History:  The patient  reports that she has never smoked. She has never used smokeless tobacco. She reports that she drinks alcohol. She reports that she does not use drugs.   Family History:  The patient's family history includes Alcohol abuse in her father; Breast cancer in her mother; Heart disease in her mother; Seizures in her son; Stomach cancer in her father; Thyroid disease in her sister.    ROS:  Please see the history of present illness.   All  other systems are reviewed and negative.    PHYSICAL EXAM: VS:  BP 132/76   Pulse 76   Ht 5' 0.5" (1.537 m)   Wt 119 lb (54 kg)   BMI 22.86 kg/m  , BMI Body mass index is 22.86 kg/m. GEN: Well nourished, well developed, in no acute distress  HEENT: normal  Neck: no JVD, carotid bruits, or masses Cardiac: RRR; no murmurs, rubs, or gallops,no edema  Respiratory:  clear to auscultation bilaterally, normal work of breathing GI: soft, nontender, nondistended, + BS MS: no deformity or atrophy  Skin: warm and dry, device pocket is well healed Neuro:  Strength and sensation are intact Psych: euthymic mood, full affect  Device interrogation is reviewed today in detail.  See PaceArt for details.  ekg today is personally reviewed and reveals sinus rhythm with V pacing  Recent Labs: 12/11/2016: ALT 22; BUN 22.3; Creatinine 0.9; HGB 14.5; Platelets 210; Potassium 4.0; Sodium 135    Lipid Panel     Component Value Date/Time   CHOL 194 07/25/2007 0942   TRIG 69 07/25/2007 0942   HDL 62.7 07/25/2007 0942   CHOLHDL 3.1 CALC 07/25/2007 0942   VLDL 14 07/25/2007 0942   LDLCALC 118 (H) 07/25/2007 0942     Wt Readings from Last 3 Encounters:  12/16/16 119 lb (54 kg)  12/11/16 116 lb 9.6 oz (52.9 kg)  11/29/16 120 lb (54.4 kg)      ASSESSMENT AND PLAN:  1.  Complete heart block Device dependant Normal pacemaker function See Pace Art report No changes today 3 years estimated battery longevity  2. Paroxysmal atrial fibrillation New diagnosis  Asymptomatic <1% by PPM interrogation (longest episode is 20 minutes) Will follow supportively.  Could consider anticoagulation if afib burden increases significantly  Today, I have spent 25 minutes with the patient and daughter discussing new afib diagnosis .  More than 50% of the visit time today was spent on this issue.    Follow-up: return to see EP NP every 6-12 months  (requires device check every 6 months)  Signed, Thompson Grayer, MD  12/16/2016 10:28 AM     [place] [address] [place] [address] [phone] (office) [phone] (fax)

## 2016-12-17 ENCOUNTER — Ambulatory Visit
Admission: RE | Admit: 2016-12-17 | Discharge: 2016-12-17 | Disposition: A | Discharge status: Home / Self Care | Payer: Medicare Other | Source: Ambulatory Visit | Attending: Internal Medicine | Admitting: Internal Medicine

## 2016-12-17 DIAGNOSIS — M25562 Pain in left knee: Secondary | ICD-10-CM

## 2016-12-17 DIAGNOSIS — S83242A Other tear of medial meniscus, current injury, left knee, initial encounter: Secondary | ICD-10-CM | POA: Diagnosis not present

## 2016-12-17 MED ORDER — IOPAMIDOL (ISOVUE-300) INJECTION 61%
100.0000 mL | Freq: Once | INTRAVENOUS | Status: AC | PRN
  Administered 2016-12-17: 100 mL via INTRAVENOUS

## 2016-12-21 NOTE — Telephone Encounter (Signed)
Spoke to [employer].  Pt is currently in independent living at [facility]  Will be moving to the assisted living housing. Would like a referral to neurology due to memory loss.  Family has seen a cognitive decline.  Family does not care where referral is sent.  Pap is requesting that only she be contacted.  Pt is unaware she is moving at this time.  Will be told on Thursday.

## 2016-12-21 NOTE — Telephone Encounter (Signed)
Dr. Sherren Mocha verbally authorized referral.  Order placed in the system.  Message to call daughter only attached to referral.

## 2016-12-23 ENCOUNTER — Telehealth: Payer: Self-pay | Admitting: *Deleted

## 2016-12-23 ENCOUNTER — Other Ambulatory Visit: Payer: Self-pay | Admitting: *Deleted

## 2016-12-23 DIAGNOSIS — M25562 Pain in left knee: Secondary | ICD-10-CM | POA: Diagnosis not present

## 2016-12-23 DIAGNOSIS — M1712 Unilateral primary osteoarthritis, left knee: Secondary | ICD-10-CM | POA: Diagnosis not present

## 2016-12-23 MED ORDER — HYDROCODONE-ACETAMINOPHEN 5-325 MG PO TABS: 1.0000 | ORAL_TABLET | ORAL | 0 refills | Status: DC | PRN

## 2016-12-23 NOTE — Telephone Encounter (Signed)
Received call from daughter [name] requesting refill of Hydrocodone  For pt. Lauren Buchanan's     Phone       [phone].

## 2016-12-30 ENCOUNTER — Encounter: Payer: Self-pay | Admitting: *Deleted

## 2016-12-30 ENCOUNTER — Telehealth: Payer: Self-pay | Admitting: Family Medicine

## 2016-12-30 NOTE — Telephone Encounter (Signed)
Paper work received.  Waiting on Dr. Sherren Mocha to return on Monday.

## 2016-12-30 NOTE — Telephone Encounter (Signed)
Paperwork was dropped off on the 12/25/16 and would like to see if it is ready they are aware that Dr. Sherren Mocha is not in the office will be returning Monday 01/04/17.

## 2017-01-01 DIAGNOSIS — R2689 Other abnormalities of gait and mobility: Secondary | ICD-10-CM | POA: Diagnosis not present

## 2017-01-01 DIAGNOSIS — M25562 Pain in left knee: Secondary | ICD-10-CM | POA: Diagnosis not present

## 2017-01-01 DIAGNOSIS — M6281 Muscle weakness (generalized): Secondary | ICD-10-CM | POA: Diagnosis not present

## 2017-01-04 DIAGNOSIS — M6281 Muscle weakness (generalized): Secondary | ICD-10-CM | POA: Diagnosis not present

## 2017-01-04 DIAGNOSIS — M25562 Pain in left knee: Secondary | ICD-10-CM | POA: Diagnosis not present

## 2017-01-04 DIAGNOSIS — R2689 Other abnormalities of gait and mobility: Secondary | ICD-10-CM | POA: Diagnosis not present

## 2017-01-04 NOTE — Telephone Encounter (Signed)
Called [name] for pick up

## 2017-01-05 DIAGNOSIS — R2689 Other abnormalities of gait and mobility: Secondary | ICD-10-CM | POA: Diagnosis not present

## 2017-01-05 DIAGNOSIS — M25562 Pain in left knee: Secondary | ICD-10-CM | POA: Diagnosis not present

## 2017-01-05 DIAGNOSIS — M6281 Muscle weakness (generalized): Secondary | ICD-10-CM | POA: Diagnosis not present

## 2017-01-07 DIAGNOSIS — M25562 Pain in left knee: Secondary | ICD-10-CM | POA: Diagnosis not present

## 2017-01-07 DIAGNOSIS — M6281 Muscle weakness (generalized): Secondary | ICD-10-CM | POA: Diagnosis not present

## 2017-01-07 DIAGNOSIS — R2689 Other abnormalities of gait and mobility: Secondary | ICD-10-CM | POA: Diagnosis not present

## 2017-01-08 ENCOUNTER — Other Ambulatory Visit (HOSPITAL_BASED_OUTPATIENT_CLINIC_OR_DEPARTMENT_OTHER): Payer: Medicare Other

## 2017-01-08 ENCOUNTER — Ambulatory Visit (HOSPITAL_BASED_OUTPATIENT_CLINIC_OR_DEPARTMENT_OTHER): Payer: Medicare Other | Admitting: Oncology

## 2017-01-08 ENCOUNTER — Ambulatory Visit (HOSPITAL_BASED_OUTPATIENT_CLINIC_OR_DEPARTMENT_OTHER): Payer: Medicare Other

## 2017-01-08 VITALS — BP 151/52 | HR 77 | Temp 97.8°F | Resp 17 | Ht 60.5 in | Wt 120.0 lb

## 2017-01-08 DIAGNOSIS — C50412 Malignant neoplasm of upper-outer quadrant of left female breast: Secondary | ICD-10-CM | POA: Diagnosis not present

## 2017-01-08 DIAGNOSIS — C50911 Malignant neoplasm of unspecified site of right female breast: Secondary | ICD-10-CM

## 2017-01-08 DIAGNOSIS — C50811 Malignant neoplasm of overlapping sites of right female breast: Secondary | ICD-10-CM

## 2017-01-08 DIAGNOSIS — C7951 Secondary malignant neoplasm of bone: Secondary | ICD-10-CM

## 2017-01-08 DIAGNOSIS — Z170 Estrogen receptor positive status [ER+]: Secondary | ICD-10-CM

## 2017-01-08 DIAGNOSIS — C50912 Malignant neoplasm of unspecified site of left female breast: Secondary | ICD-10-CM

## 2017-01-08 DIAGNOSIS — Z5111 Encounter for antineoplastic chemotherapy: Secondary | ICD-10-CM | POA: Diagnosis present

## 2017-01-08 DIAGNOSIS — C3492 Malignant neoplasm of unspecified part of left bronchus or lung: Secondary | ICD-10-CM

## 2017-01-08 LAB — CBC WITH DIFFERENTIAL/PLATELET
BASO%: 0.3 % (ref 0.0–2.0)
BASOS ABS: 0 10*3/uL (ref 0.0–0.1)
EOS ABS: 0.1 10*3/uL (ref 0.0–0.5)
EOS%: 1.1 % (ref 0.0–7.0)
HCT: 40.8 % (ref 34.8–46.6)
HEMOGLOBIN: 13.5 g/dL (ref 11.6–15.9)
LYMPH%: 17.6 % (ref 14.0–49.7)
MCH: 29.9 pg (ref 25.1–34.0)
MCHC: 33.1 g/dL (ref 31.5–36.0)
MCV: 90.5 fL (ref 79.5–101.0)
MONO#: 0.8 10*3/uL (ref 0.1–0.9)
MONO%: 12.1 % (ref 0.0–14.0)
NEUT#: 4.3 10*3/uL (ref 1.5–6.5)
NEUT%: 68.9 % (ref 38.4–76.8)
PLATELETS: 252 10*3/uL (ref 145–400)
RBC: 4.51 10*6/uL (ref 3.70–5.45)
RDW: 14.3 % (ref 11.2–14.5)
WBC: 6.3 10*3/uL (ref 3.9–10.3)
lymph#: 1.1 10*3/uL (ref 0.9–3.3)

## 2017-01-08 LAB — COMPREHENSIVE METABOLIC PANEL
ALBUMIN: 3.6 g/dL (ref 3.5–5.0)
ALK PHOS: 80 U/L (ref 40–150)
ALT: 23 U/L (ref 0–55)
ANION GAP: 10 meq/L (ref 3–11)
AST: 25 U/L (ref 5–34)
BILIRUBIN TOTAL: 1.22 mg/dL — AB (ref 0.20–1.20)
BUN: 13.8 mg/dL (ref 7.0–26.0)
CO2: 25 meq/L (ref 22–29)
Calcium: 9 mg/dL (ref 8.4–10.4)
Chloride: 100 mEq/L (ref 98–109)
Creatinine: 0.8 mg/dL (ref 0.6–1.1)
EGFR: 62 mL/min/{1.73_m2} — AB (ref >90)
GLUCOSE: 135 mg/dL (ref 70–140)
Potassium: 3.5 mEq/L (ref 3.5–5.1)
Sodium: 136 mEq/L (ref 136–145)
TOTAL PROTEIN: 6.6 g/dL (ref 6.4–8.3)

## 2017-01-08 MED ORDER — FULVESTRANT 250 MG/5ML IM SOLN
500.0000 mg | Freq: Once | INTRAMUSCULAR | Status: AC
  Administered 2017-01-08: 500 mg via INTRAMUSCULAR
  Filled 2017-01-08: qty 10

## 2017-01-08 NOTE — Progress Notes (Signed)
Lauren Buchanan  [phone] [phone]   ID: Lauren Buchanan DOB: 01-13-1925  MR#: [id number]  IRW#:[id number]  Patient Care Team: Dorena Cookey, MD as PCP - General (Family Medicine) Fanny Skates, MD as Consulting Physician (General Surgery) Chauncey Cruel, MD as Consulting Physician (Oncology) [name], MD as Consulting Physician (Radiation Oncology) Rockwell Germany, RN as Registered Nurse Mauro Kaufmann, RN as Registered Nurse PCP: Joycelyn Man, MD GYN: OTHER MD: Danton Sewer MD, Thompson Grayer MD, Unice Bailey MD, Rolm Bookbinder MD  CHIEF COMPLAINT: bilateral estrogen receptor positive breast cancer, metastatic to bone  CURRENT TREATMENT: fulvestrant, zolendronate, letrozole  NOTE: Patient requests her daughter Lauren Buchanan be contacted regarding all appointments and procedures. Lauren Buchanan can be reached at [phone]  INTERVAL HISTORY: Lauren Buchanan returns today for follow-up of her metastatic estrogen receptor positive breast cancer and her separate lung cancer, accompanied by her daughter. Lauren Buchanan continues on letrozole, with good tolerance. Hot flashes and vaginal dryness are not a major issue. She never developed the arthralgias or myalgias that many patients can experience on this medication. She is also on fulvestrant every 28 days. Aside from the actual discomfort of administering the medication she tolerates that with no side effects that she is aware of  She also receives zolendronate every 12 weeks. Her next dose will be due in April  Since her last visit here [facility]  REVIEW OF SYSTEMS: Lauren Buchanan continues to have significant problems with her left knee and films 12/17/2016 showed a meniscal tear. She uses using a cane and doing some rehabilitation and will be following with orthopedics for possible "shot" or other intervention. Otherwise a detailed review of systems today was stable  BREAST CANCER  HISTORY: From the original intake note:  "Lauren Buchanan"  Has been aware of a sore right nipple for the last 6-8 months. She attributed it to her washing machine, which rubs that area when she uses it. On  11/21/2014 she saw Dr. 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 She mentioned the chest wall problem and Dr. Ubaldo Glassing obtained to skin biopsies 1 from the right inferior central chest and the other one of from the right mid medial chest.  The second of these showed a nodular basal cell carcinoma. The first one however showed invasive carcinoma consistent with a breast primary. This tumor was estrogen receptor positive at 100%, progesterone receptor positive at 36%, both with strong staining intensity, and HER-2 was amplified , the signals ratio being 6.2. [name] [id number])  On 12/06/2014 the patient underwent bilateral diagnostic mammography with bilateral ultrasonography. Breast density was category B. There was bilateral nipple retraction. In the left breast upper outer quadrant there was a spiculated mass noted by mammography with a second spiculated mass in the lower inner quadrant. By ultrasound there was a 1.6 cm hypoechoic lesion at the 1:30 o'clock position in the left breast, and a second mass more medially measuring 0.9 cm. Also at the 8:00 position in the left breast 2 cm from the nipple there was a 1.3 cm mass. Ultrasound of the left axilla showed an enlarged lymph node with fatty hilum replacement, measuring 1.4 cm.  In the right breast mammography showed a cluster of indeterminate microcalcifications in the upper outer quadrant and a retroareolar mass, which bile does sound measured 2.9 cm. Ultrasound of the right axilla showed a right axilla lymph node measuring 1.4 cm.  On  12/06/2014 the patient underwent needle core biopsy of 2 separate masses in the left breast (at 1:00 and 8:00) as well as the enlarged axillary lymph node. All 3 were positive for an invasive lobular  carcinoma (E-cadherin negative). All 3 tumors were estrogen receptor positive with strong staining intensity (between 87% and 100%). The lymph node was progesterone receptor negative, the other 2 masses being progesterone receptor positive at 9% and at 76%. This cancer was reported to be HER-2 positive at conference today, though I do not have the ratios at hand.  The patient is not an MRI candidate because she has a permanent pacemaker in place. Her subsequent history is as detailed below    PAST MEDICAL HISTORY: Past Medical History:  Diagnosis Date  . Asthma   . Breast cancer ([place]) 12/08/2014   Bilateral  . Cancer of central portion of female breast ([place]) 12/04/2014  . Cancer of central portion of female breast ([place]) 12/04/2014  . Complete heart block ([place]) 1999   s/p PPM by Dr Olevia Perches, most recent generator change 2009  . COPD (chronic obstructive pulmonary disease) ([place])   . Diverticulosis   . DJD (degenerative joint disease)   . Esophageal stricture   . GERD (gastroesophageal reflux disease)   . Giant cell arteritis ([place])   . Goiter   . Hemorrhoids   . Hiatal hernia   . IBS (irritable bowel syndrome)   . Lung cancer ([place])   . Paroxysmal atrial fibrillation ([place]) 12/17/2015   asymptomatic, <1%, determined on regular pacemaker interrogation  . Pneumonia   . Renal cyst   . S/P radiation therapy 04/24/15 completed   T-11  . Wears glasses     PAST SURGICAL HISTORY: Past Surgical History:  Procedure Laterality Date  . ANAL FISSURE REPAIR    . ARTERY BIOPSY Left 01/03/2013   Procedure: BIOPSY LEFT TEMPORAL ARTERY;  Surgeon: Ascencion Dike, MD;  Location: [facility];  Service: ENT;  Laterality: Left;  . BREAST BIOPSY Right   . CHOLECYSTECTOMY    . COLONOSCOPY    . FRACTURE SURGERY     fx lt wrist  . hemorrhoidectomy    . left lower lobectomy for brochiectasis  1950  . MOUTH SURGERY    . PACEMAKER INSERTION  1999   Gen change ([facility]) by Dr Olevia Perches 2009  . RECTAL  POLYPECTOMY    . RENAL CYST EXCISION    . TONSILLECTOMY      FAMILY HISTORY Family History  Problem Relation Age of Onset  . Heart disease Mother   . Breast cancer Mother   . Stomach cancer Father   . Alcohol abuse Father   . Seizures Son   . Thyroid disease Sister    the patient's father died at the age of 45, with stomach cancer. The patient's mother died at the age of 64 from a myocardial infarction. She had been diagnosed with breast cancer in her 11s. The patient had no brothers, 2 sisters. There is no other history of breast or ovarian cancer in the family to the patient's knowledge  GYNECOLOGIC HISTORY:  No LMP recorded. Patient is postmenopausal. Menarche age 11, first live birth age 68. The patient is GX P4. She underwent menopause in her late 62s. She did not take hormone replacement.  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 She is a widow, lives by herself, with no pets. Daughter Lauren Buchanan lives in [place] where she  is Mudlogger of a preschool and day care. Son Lauren Buchanan lives in [place] where he owns a furniture to sign store. Daughter Lauren Buchanan lives in [place]. She owns a Chiropractor. Son Lauren Buchanan is physically and mentally disabled and lives in a group home in [place]. The patient has 7 grandchildren and 2 great-grandchildren. She attends a local [place]: In place; the patient does not wish to be resuscitated in case of a terminal event; the patient's daughter Lauren Buchanan is her healthcare power of attorney. She can be reached at [place]: Social History  Substance Use Topics  . Smoking status: Never Smoker  . Smokeless tobacco: Never Used  . Alcohol use 0.0 oz/week     Comment: Occasional      Colonoscopy:  PAP:  Bone density:02/19/2015, osteoporosis  Lipid panel:  Allergies  Allergen Reactions  . Morphine And Related Nausea Only  . Amoxicillin-Pot Clavulanate  Other (See Comments)    Unknown     Current Outpatient Prescriptions  Medication Sig Dispense Refill  . Calcium Citrate-Vitamin D (CITRACAL PETITES/VITAMIN D) 200-250 MG-UNIT TABS Take 1 tablet by mouth daily.      Marland Kitchen HYDROcodone-acetaminophen (NORCO/VICODIN) 5-325 MG tablet Take 1-2 tablets by mouth every 4 (four) hours as needed for severe pain. 60 tablet 0  . letrozole (FEMARA) 2.5 MG tablet Take 1 tablet (2.5 mg total) by mouth daily. 90 tablet 4  . metoprolol succinate (TOPROL-XL) 100 MG 24 hr tablet TAKE 1/2 (ONE-HALF) TABLET BY MOUTH EVERY MORNING. Take with or immediately following a meal. 45 tablet 3  . predniSONE (DELTASONE) 5 MG tablet Take 10 mg by mouth daily with breakfast. Reported on 11/19/2015    . traMADol (ULTRAM) 50 MG tablet Take 1 tablet (50 mg total) by mouth every 6 (six) hours as needed for moderate pain or severe pain. Reported on 11/19/2015 30 tablet 3  . TURMERIC PO Take 1 capsule by mouth daily.     No current facility-administered medications for this visit.     OBJECTIVE: Elderly white woman using a cane Vitals:   01/08/17 0927  BP: (!) 151/52  Pulse: 77  Resp: 17  Temp: 97.8 F (36.6 C)     Body mass index is 23.05 kg/m.    ECOG FS:2 - Symptomatic, <50% confined to bed  Sclerae unicteric, EOMs intact Oropharynx clear and moist No cervical or supraclavicular adenopathy Lungs no rales or rhonchi Heart regular rate and rhythm Abd soft, nontender, positive bowel sounds MSK no focal spinal tenderness, no upper extremity lymphedema, walks with a limp secondary to left knee pain Neuro: nonfocal, well oriented, appropriate affect Breasts: The right breast periareolar mass is still palpable but seems slightly softer. The nipple is unremarkable. I do not palpate right axillary masses. The mass in the left lateral breast is softer. There are no other skin changes. The left axilla is also benign.    LAB RESULTS:  CMP     Component Value Date/Time   NA 135  (L) 12/11/2016 1101   K 4.0 12/11/2016 1101   CL 100 (L) 11/29/2016 1749   CO2 25 12/11/2016 1101   GLUCOSE 86 12/11/2016 1101   BUN 22.3 12/11/2016 1101   CREATININE 0.9 12/11/2016 1101   CALCIUM 9.6 12/11/2016 1101   PROT 6.6 12/11/2016 1101   ALBUMIN 3.6 12/11/2016 1101   AST 24 12/11/2016 1101   ALT 22 12/11/2016 1101   ALKPHOS 85 12/11/2016 1101  BILITOT 1.47 (H) 12/11/2016 1101   GFRNONAA >60 11/29/2016 1749   GFRAA >60 11/29/2016 1749    INo results found for: SPEP, UPEP  Lab Results  Component Value Date   WBC 6.3 01/08/2017   NEUTROABS 4.3 01/08/2017   HGB 13.5 01/08/2017   HCT 40.8 01/08/2017   MCV 90.5 01/08/2017   PLT 252 01/08/2017      Chemistry      Component Value Date/Time   NA 135 (L) 12/11/2016 1101   K 4.0 12/11/2016 1101   CL 100 (L) 11/29/2016 1749   CO2 25 12/11/2016 1101   BUN 22.3 12/11/2016 1101   CREATININE 0.9 12/11/2016 1101      Component Value Date/Time   CALCIUM 9.6 12/11/2016 1101   ALKPHOS 85 12/11/2016 1101   AST 24 12/11/2016 1101   ALT 22 12/11/2016 1101   BILITOT 1.47 (H) 12/11/2016 1101       Lab Results  Component Value Date   LABCA2 20 04/01/2016    No components found for: [id number]  No results for input(s): INR in the last 168 hours.  Urinalysis    Component Value Date/Time   COLORURINE COLORLESS (A) 11/29/2016 1707   APPEARANCEUR CLEAR 11/29/2016 1707   LABSPEC 1.002 (L) 11/29/2016 1707   PHURINE 7.0 11/29/2016 1707   GLUCOSEU NEGATIVE 11/29/2016 1707   HGBUR SMALL (A) 11/29/2016 1707   HGBUR trace-intact 01/17/2010 0932   BILIRUBINUR NEGATIVE 11/29/2016 1707   BILIRUBINUR n 09/01/2011 1157   KETONESUR NEGATIVE 11/29/2016 1707   PROTEINUR NEGATIVE 11/29/2016 1707   UROBILINOGEN 0.2 09/27/2014 1800   NITRITE NEGATIVE 11/29/2016 1707   LEUKOCYTESUR NEGATIVE 11/29/2016 1707    STUDIES: Ct Knee Left W Contrast  Result Date: 12/17/2016 CLINICAL DATA:  Left knee pain for 2 months. No known injury.  History of breast carcinoma. EXAM: CT OF THE LEFT KNEE WITH CONTRAST TECHNIQUE: Multidetector CT imaging was performed following the standard protocol during bolus administration of intravenous contrast. COMPARISON:  Plain films left knee 11/29/2016. CONTRAST:  100 cc Isovue 370. FINDINGS: Bones/Joint/Cartilage All imaged bones appear normal. No erosion, fracture, dislocation or lytic or sclerotic lesion is identified. Ligaments Suboptimally assessed by CT The anterior and posterior cruciate ligaments and collateral ligaments appear normal. The lateral meniscus has a normal CT appearance. The patient appears to have a radial tear of the root of the posterior horn of the medial meniscus. Muscles and Tendons Normal. Soft Tissues Very small joint effusion. No Baker's cyst is identified. Vascular structures enhance normally. Joint spaces are preserved. IMPRESSION: Although not as sensitive as MRI, the patient appears to have a large radial tear through the root of the posterior horn of the medial meniscus. Note is made the patient is has a pacemaker and not eligible for MR. Electronically Signed   By: Drusilla Kanner M.D.   On: 12/17/2016 15:06    ASSESSMENT: 83 y.o.  woman with bilateral breast cancer, as follows:  (1) status post right breastt overlapping sites skin biopsy 11/21/2014 for a clinical T4 N1, stage IIIB invasive ductal carcinoma, estrogen and progesterone receptor positive, HER-2 amplified  (2) status post 2 separate left breast upper outer quadrant biopsies and one left axillary lymph node biopsy 12/04/2014, all positive for a clinical mT1c N1, stage IIA invasive lobular carcinoma, estrogen receptor positive, progesterone receptor variable, with MIB-1 between 10 and 40%, and no HER-2 amplification  (3) neoadjuvant anastrozole started 12/14/2014. Discontinued with progression by repeat left breast US May 2017  (4) definitive  surgery to consist of bilateral mastectomies without formal  axillary dissection: Postponed  METASTATIC DISEASE: March 2016 (5) PET scan 12/19/2014 shows in addition to the bilateral breast and bilateral axillary masses, a lesion at the T11 vertebral body, showing an irregular lucency on prior CT exam  (a) adjuvant radiationt to T10-T12: 04/11/2015 through 04/24/2015 to a total dose of 30 gray in 10 fractions  (6) consider anti-HER-2 treatment depending on response to antiestrogen therapy alone.    (7) osteoporosis with T score of -2.6 on bone scan obtained 02/19/2015.  (a) zolendronate started 03/20/2015, repeated every 12 weeks   (8) fulvestrant started 03/04/16  (a) patient refused adding palbociclib  (b) PET scan in 10/15/2016 showed mild increase in the right breast mass and increased size and activity of 3.2 cm of solid left lung nodule  (b) letrozole added 10/16/2016  (9) Doppler ultrasound both lower extremities 11/17/2016 or repeat 11/29/2016 negative  PLAN: Lorane is tolerating her treatments well and we are proceeding with fulvestrant today. She will continue on letrozole daily.  She will return to see me in 4 weeks and in addition we will do zolendronate at that time  We do need to repeat a PET scan before that visit. This will give [place] an idea on how we are doing on the bilateral breast cancers and also perhaps more importantly the lung cancer. If there has been significant progression there she may need stereotactic radiosurgery.  She is receiving orthopedic care for her left knee but does not recall the doctor's name. She will let me know so we can send a copy of this dictation to her bone doctor.  [facility] is [facility] as full code the patient does not desire to BE a full code I have written a DO NOT RESUSCITATE order and requested that the staff reviewed with her her advanced directives and get then scanned  She will return to see me in 4 weeks. She knows to call for any problems that  may develop before that visit. Chauncey Cruel, MD   01/08/2017 9:33 AM

## 2017-01-08 NOTE — Patient Instructions (Signed)

## 2017-01-11 DIAGNOSIS — R2689 Other abnormalities of gait and mobility: Secondary | ICD-10-CM | POA: Diagnosis not present

## 2017-01-11 DIAGNOSIS — M25562 Pain in left knee: Secondary | ICD-10-CM | POA: Diagnosis not present

## 2017-01-11 DIAGNOSIS — M6281 Muscle weakness (generalized): Secondary | ICD-10-CM | POA: Diagnosis not present

## 2017-01-12 DIAGNOSIS — M6281 Muscle weakness (generalized): Secondary | ICD-10-CM | POA: Diagnosis not present

## 2017-01-12 DIAGNOSIS — M25562 Pain in left knee: Secondary | ICD-10-CM | POA: Diagnosis not present

## 2017-01-12 DIAGNOSIS — R2689 Other abnormalities of gait and mobility: Secondary | ICD-10-CM | POA: Diagnosis not present

## 2017-01-13 DIAGNOSIS — M25562 Pain in left knee: Secondary | ICD-10-CM | POA: Diagnosis not present

## 2017-01-13 DIAGNOSIS — M1712 Unilateral primary osteoarthritis, left knee: Secondary | ICD-10-CM | POA: Diagnosis not present

## 2017-01-14 DIAGNOSIS — R2689 Other abnormalities of gait and mobility: Secondary | ICD-10-CM | POA: Diagnosis not present

## 2017-01-14 DIAGNOSIS — M6281 Muscle weakness (generalized): Secondary | ICD-10-CM | POA: Diagnosis not present

## 2017-01-14 DIAGNOSIS — M25562 Pain in left knee: Secondary | ICD-10-CM | POA: Diagnosis not present

## 2017-02-02 ENCOUNTER — Other Ambulatory Visit: Payer: Self-pay | Admitting: *Deleted

## 2017-02-02 ENCOUNTER — Other Ambulatory Visit: Payer: Self-pay

## 2017-02-02 DIAGNOSIS — C3492 Malignant neoplasm of unspecified part of left bronchus or lung: Secondary | ICD-10-CM

## 2017-02-02 DIAGNOSIS — C50412 Malignant neoplasm of upper-outer quadrant of left female breast: Secondary | ICD-10-CM

## 2017-02-02 DIAGNOSIS — C7951 Secondary malignant neoplasm of bone: Secondary | ICD-10-CM

## 2017-02-02 DIAGNOSIS — C50911 Malignant neoplasm of unspecified site of right female breast: Secondary | ICD-10-CM

## 2017-02-02 DIAGNOSIS — Z170 Estrogen receptor positive status [ER+]: Principal | ICD-10-CM

## 2017-02-04 ENCOUNTER — Non-Acute Institutional Stay: Payer: Medicare Other | Admitting: Nurse Practitioner

## 2017-02-04 ENCOUNTER — Other Ambulatory Visit: Payer: Self-pay | Admitting: Oncology

## 2017-02-04 ENCOUNTER — Encounter: Payer: Self-pay | Admitting: Nurse Practitioner

## 2017-02-04 DIAGNOSIS — M23204 Derangement of unspecified medial meniscus due to old tear or injury, left knee: Secondary | ICD-10-CM

## 2017-02-04 DIAGNOSIS — C7951 Secondary malignant neoplasm of bone: Secondary | ICD-10-CM

## 2017-02-04 DIAGNOSIS — C50412 Malignant neoplasm of upper-outer quadrant of left female breast: Secondary | ICD-10-CM

## 2017-02-04 DIAGNOSIS — M316 Other giant cell arteritis: Secondary | ICD-10-CM

## 2017-02-04 DIAGNOSIS — I471 Supraventricular tachycardia: Secondary | ICD-10-CM

## 2017-02-04 DIAGNOSIS — Z170 Estrogen receptor positive status [ER+]: Principal | ICD-10-CM

## 2017-02-04 DIAGNOSIS — C50811 Malignant neoplasm of overlapping sites of right female breast: Secondary | ICD-10-CM | POA: Diagnosis not present

## 2017-02-04 DIAGNOSIS — Z950 Presence of cardiac pacemaker: Secondary | ICD-10-CM | POA: Diagnosis not present

## 2017-02-04 DIAGNOSIS — C3492 Malignant neoplasm of unspecified part of left bronchus or lung: Secondary | ICD-10-CM

## 2017-02-04 DIAGNOSIS — C50911 Malignant neoplasm of unspecified site of right female breast: Secondary | ICD-10-CM

## 2017-02-04 DIAGNOSIS — J449 Chronic obstructive pulmonary disease, unspecified: Secondary | ICD-10-CM

## 2017-02-04 NOTE — Assessment & Plan Note (Signed)
Stable, at least 4 years on steroid, currently taking Prednisone '10mg'$  daily.

## 2017-02-04 NOTE — Assessment & Plan Note (Signed)
Under went pulmonology evaluation, stable

## 2017-02-04 NOTE — Assessment & Plan Note (Signed)
Heart rate is in control, continue Metoprolol succinate '100mg'$  daily.

## 2017-02-04 NOTE — Assessment & Plan Note (Signed)
AV block, f/u cardiology

## 2017-02-04 NOTE — Assessment & Plan Note (Signed)
R+L breast, f/u oncology, continue Letrozole 2.'5mg'$  daily.

## 2017-02-04 NOTE — Progress Notes (Signed)
Provider:  Marlana Latus NP Location:   [facility]   Place of Service:  Clinic (12)  PCP: Joycelyn Issaic Welliver, MD Patient Care Team: Dorena Cookey, MD as PCP - General (Family Medicine) Fanny Skates, MD as Consulting Physician (General Surgery) Chauncey Cruel, MD as Consulting Physician (Oncology) [name], MD as Consulting Physician (Radiation Oncology) Rockwell Germany, RN as Registered Nurse Mauro Kaufmann, RN as Registered Nurse Dominic Jethro Bastos, MD as Consulting Physician [facility] Medicine)  Extended Emergency Contact Information Primary Emergency Contact: Matthews,[name] Address: [address]          [place], [place] [address] [place] Phone: [phone] Relation: Daughter Secondary Emergency Contact: Clent [place] Phone: [phone] Relation: Other  Code Status: DNR Goals of Care: Advanced Directive information Advanced Directives 02/04/2017  Does Patient Have a Medical Advance Directive? Yes  Type of Advance Directive Living will  Does patient want to make changes to medical advance directive? No - Patient declined  Copy of [place] in Chart? -  Would patient like information on creating a medical advance directive? -  Pre-existing out of facility DNR order (yellow form or pink MOST form) -      Chief Complaint  Patient presents with  . Establish Care    HPI: Patient is a 83 y.o. female seen today for admitting to [facility] service  Hx of giant cell arteritis, at least 4 years on steroid, currently taking Prednisone '10mg'$  daily. AV block, has pacemaker. Left knee medial meniscus tear 12/2016, underwent Ortho eval, therapy, currently wearing supportive sleeve, ambulates without pain, prn Tramadol, Norco, Voltaren topical available to her. Hx of breast caner, f/u oncology q3 weeks, taking Letrozole 2.'5mg'$  daily. SVT/PSVT/PAT, heart rate   is controlled, taking Metoprolol '100mg'$  daily.    Past Medical  History:  Diagnosis Date  . Asthma   . Breast cancer ([place]) 12/08/2014   Bilateral  . Cancer of central portion of female breast ([place]) 12/04/2014  . Cancer of central portion of female breast ([place]) 12/04/2014  . Complete heart block ([place]) 1999   s/p PPM by Dr Olevia Perches, most recent generator change 2009  . COPD (chronic obstructive pulmonary disease) ([place])   . Diverticulosis   . DJD (degenerative joint disease)   . Esophageal stricture   . GERD (gastroesophageal reflux disease)   . Giant cell arteritis ([place])   . Goiter   . Hemorrhoids   . Hiatal hernia   . IBS (irritable bowel syndrome)   . Lung cancer ([place])   . Paroxysmal atrial fibrillation ([place]) 12/17/2015   asymptomatic, <1%, determined on regular pacemaker interrogation  . Pneumonia   . Renal cyst   . S/P radiation therapy 04/24/15 completed   T-11  . Wears glasses    Past Surgical History:  Procedure Laterality Date  . ANAL FISSURE REPAIR    . ARTERY BIOPSY Left 01/03/2013   Procedure: BIOPSY LEFT TEMPORAL ARTERY;  Surgeon: Ascencion Dike, MD;  Location: [place];  Service: ENT;  Laterality: Left;  . BREAST BIOPSY Right   . CHOLECYSTECTOMY    . COLONOSCOPY    . FRACTURE SURGERY     fx lt wrist  . hemorrhoidectomy    . left lower lobectomy for brochiectasis  1950  . MOUTH SURGERY    . PACEMAKER INSERTION  1999   Gen change ([facility]) by Dr Olevia Perches 2009  . RECTAL POLYPECTOMY    . RENAL CYST  EXCISION    . TONSILLECTOMY      reports that she has never smoked. She has never used smokeless tobacco. She reports that she drinks alcohol. She reports that she does not use drugs. Social History   Social History  . Marital status: Married    Spouse name: N/A  . Number of children: 4  . Years of education: N/A   Occupational History  . Retired    Social History Main Topics  . Smoking status: Never Smoker  . Smokeless tobacco: Never Used  . Alcohol use 0.0 oz/week     Comment: Occasional   . Drug use: No  .  Sexual activity: Not Currently   Other Topics Concern  . Not on file   Social History Narrative   Social History     Marital status: Widowed          Spouse name:                        Years of education:                 Number of children: 4           Occupational History   Occupation: Copywriter, advertising                Retired : Yes                                      Social History Main Topics     Smoking status: Never Smoker                                                                  Smokeless tobacco: Never Used                         Alcohol use:         0.0 oz/week        Comment:      Drug use: No               Sexual activity:        Other Topics            Concern     None on file      Social History Narrative     Do you drink/eat things with caffeine?    Yes     Marital status: Widowed.  What year were you married? 1948     Do you have a living will? Yes     Do you have DNR form? Yes     Do you have signed POA/HPOA forms? Yes                Functional Status Survey:    Family History  Problem Relation Age of Onset  . Heart disease Mother   . Breast cancer Mother   . Stomach cancer Father   . Alcohol abuse Father   . Seizures Son   . Thyroid disease Sister  Health Maintenance  Topic Date Due  . PNA vac Low Risk Adult (2 of 2 - PCV13) 01/18/2011  . INFLUENZA VACCINE  06/19/2017 (Originally 05/19/2017)  . TETANUS/TDAP  07/24/2017  . DEXA SCAN  Completed    Allergies  Allergen Reactions  . Morphine And Related Nausea Only  . Amoxicillin-Pot Clavulanate Other (See Comments)    Unknown     Allergies as of 02/04/2017      Reactions   Morphine And Related Nausea Only   Amoxicillin-pot Clavulanate Other (See Comments)   Unknown       Medication List       Accurate as of 02/04/17  4:04 PM. Always use your most recent med list.          CITRACAL PETITES/VITAMIN D 200-250 MG-UNIT Tabs Generic drug:   Calcium Citrate-Vitamin D Take 1 tablet by mouth daily.   diclofenac sodium 1 % Gel Commonly known as:  VOLTAREN Apply topically.   HYDROcodone-acetaminophen 5-325 MG tablet Commonly known as:  NORCO/VICODIN Take 1-2 tablets by mouth every 4 (four) hours as needed for severe pain.   letrozole 2.5 MG tablet Commonly known as:  FEMARA Take 1 tablet (2.5 mg total) by mouth daily.   metoprolol succinate 100 MG 24 hr tablet Commonly known as:  TOPROL-XL TAKE 1/2 (ONE-HALF) TABLET BY MOUTH EVERY MORNING. Take with or immediately following a meal.   predniSONE 5 MG tablet Commonly known as:  DELTASONE Take 10 mg by mouth daily with breakfast. Reported on 11/19/2015   traMADol 50 MG tablet Commonly known as:  ULTRAM Take 1 tablet (50 mg total) by mouth every 6 (six) hours as needed for moderate pain or severe pain. Reported on 11/19/2015   TURMERIC PO Take 1 capsule by mouth daily.       Review of Systems  Constitutional: Negative for fever and unexpected weight change.  HENT: Negative for congestion, dental problem, ear pain, nosebleeds, postnasal drip, rhinorrhea, sinus pressure, sneezing, sore throat and trouble swallowing.   Eyes: Negative for redness and itching.  Respiratory: Negative for cough, chest tightness, shortness of breath and wheezing.   Cardiovascular: Negative for palpitations and leg swelling.       Pacemaker  Gastrointestinal: Negative for nausea and vomiting.  Genitourinary: Negative for frequency, hematuria, pelvic pain and urgency.  Musculoskeletal: Positive for arthralgias. Negative for joint swelling.       12/2016 Left knee medial meniscus injury  Skin: Negative for rash.       Scars from R+L breast surgeries.   Neurological: Negative for headaches.  Hematological: Does not bruise/bleed easily.  Psychiatric/Behavioral: Negative for dysphoric mood. The patient is not nervous/anxious.     Vitals:   02/04/17 1509  BP: (!) 142/68  Pulse: 71  Resp: 18    Temp: 97.4 F (36.3 C)  TempSrc: Oral  SpO2: 95%  Weight: 120 lb 6.4 oz (54.6 kg)  Height: '5\' 1"'$  (1.549 m)   Body mass index is 22.75 kg/m. Physical Exam  Constitutional: She is oriented to person, place, and time. She appears well-developed and well-nourished.  HENT:  Head: Normocephalic and atraumatic.  Right Ear: External ear normal.  Left Ear: External ear normal.  Nose: Nose normal.  Mouth/Throat: No oropharyngeal exudate.  Eyes: Conjunctivae and EOM are normal. Pupils are equal, round, and reactive to light. Right eye exhibits no discharge. Left eye exhibits no discharge. No scleral icterus.  Neck: Normal range of motion. Neck supple. No JVD present. No tracheal deviation present.  Cardiovascular: Normal rate, regular rhythm, normal heart sounds and intact distal pulses.  Exam reveals no friction rub.   No murmur heard. pacemaker  Pulmonary/Chest: No stridor. No respiratory distress. She has no wheezes. She has no rales.  Abdominal: She exhibits no distension. There is no tenderness. There is no rebound and no guarding.  Genitourinary: Rectal exam shows guaiac positive stool.  Musculoskeletal: Normal range of motion. She exhibits no edema, tenderness or deformity.  Lymphadenopathy:    She has no cervical adenopathy.  Neurological: She is alert and oriented to person, place, and time. She has normal reflexes. No cranial nerve deficit. She exhibits normal muscle tone. Coordination normal.  Skin: Skin is warm and dry. No rash noted. No erythema. No pallor.  Surgical scars  R+L breast   Psychiatric: She has a normal mood and affect. Her behavior is normal. Judgment and thought content normal.    Labs reviewed: Basic Metabolic Panel:  Recent Labs  11/29/16 1749 12/11/16 1101 01/08/17 0914  NA 136 135* 136  K 4.1 4.0 3.5  CL 100*  --   --   CO2 '30 25 25  '$ GLUCOSE 100* 86 135  BUN 12 22.3 13.8  CREATININE 0.61 0.9 0.8  CALCIUM 9.4 9.6 9.0   Liver Function  Tests:  Recent Labs  11/29/16 1749 12/11/16 1101 01/08/17 0914  AST '26 24 25  '$ ALT '19 22 23  '$ ALKPHOS 69 85 80  BILITOT 1.3* 1.47* 1.22*  PROT 6.8 6.6 6.6  ALBUMIN 3.8 3.6 3.6    Recent Labs  11/29/16 1749  LIPASE 29   No results for input(s): AMMONIA in the last 8760 hours. CBC:  Recent Labs  11/29/16 1749 12/11/16 1101 01/08/17 0914  WBC 9.8 10.2 6.3  NEUTROABS 8.5* 7.2* 4.3  HGB 13.4 14.5 13.5  HCT 40.4 43.0 40.8  MCV 89.4 89.4 90.5  PLT 270 210 252   Cardiac Enzymes: No results for input(s): CKTOTAL, CKMB, CKMBINDEX, TROPONINI in the last 8760 hours. BNP: Invalid input(s): POCBNP No results found for: HGBA1C Lab Results  Component Value Date   TSH 0.33 (L) 12/13/2012   No results found for: VITAMINB12 No results found for: FOLATE No results found for: IRON, TIBC, FERRITIN  Imaging and Procedures obtained prior to SNF admission: Ct Knee Left W Contrast  Result Date: 12/17/2016 CLINICAL DATA:  Left knee pain for 2 months. No known injury. History of breast carcinoma. EXAM: CT OF THE LEFT KNEE WITH CONTRAST TECHNIQUE: Multidetector CT imaging was performed following the standard protocol during bolus administration of intravenous contrast. COMPARISON:  Plain films left knee 11/29/2016. CONTRAST:  100 cc Isovue 370. FINDINGS: Bones/Joint/Cartilage All imaged bones appear normal. No erosion, fracture, dislocation or lytic or sclerotic lesion is identified. Ligaments Suboptimally assessed by CT The anterior and posterior cruciate ligaments and collateral ligaments appear normal. The lateral meniscus has a normal CT appearance. The patient appears to have a radial tear of the root of the posterior horn of the medial meniscus. Muscles and Tendons Normal. Soft Tissues Very small joint effusion. No Baker's cyst is identified. Vascular structures enhance normally. Joint spaces are preserved. IMPRESSION: Although not as sensitive as MRI, the patient appears to have a large  radial tear through the root of the posterior horn of the medial meniscus. Note is made the patient is has a pacemaker and not eligible for MR. Electronically Signed   By: Inge Rise M.D.   On: 12/17/2016 15:06    Assessment/Plan  Degenerative tear  of left medial meniscus 12/17/16 CT left knee A large radial tear through the root of the posterior horn of the medial meniscus Under went Ortho eval, therapy, wearing support sleeve for the left knee, able to ambulate without pain.   Giant cell arteritis Stable, at least 4 years on steroid, currently taking Prednisone '10mg'$  daily.   COPD with chronic bronchitis Under went pulmonology evaluation, stable   PACEMAKER, PERMANENT AV block, f/u cardiology  Cancer of overlapping sites of right female breast ([place]) R+L breast, f/u oncology, continue Letrozole 2.'5mg'$  daily.   Breast cancer of upper-outer quadrant of left female breast R+L breast, f/u Oncology every 3 weeks, upcoming PET scan, taking Letrozole 2.'5mg'$  daily.   SVT/ PSVT/ PAT Heart rate is in control, continue Metoprolol succinate '100mg'$  daily.   Family/ staff Communication: IL  Labs/tests ordered: none

## 2017-02-04 NOTE — Assessment & Plan Note (Signed)
R+L breast, f/u Oncology every 3 weeks, upcoming PET scan, taking Letrozole 2.'5mg'$  daily.

## 2017-02-04 NOTE — Assessment & Plan Note (Signed)
12/17/16 CT left knee A large radial tear through the root of the posterior horn of the medial meniscus Under went Ortho eval, therapy, wearing support sleeve for the left knee, able to ambulate without pain.

## 2017-02-05 ENCOUNTER — Ambulatory Visit: Payer: Medicare Other

## 2017-02-05 ENCOUNTER — Other Ambulatory Visit: Payer: Medicare Other

## 2017-02-05 ENCOUNTER — Ambulatory Visit: Payer: Medicare Other | Admitting: Oncology

## 2017-02-10 ENCOUNTER — Ambulatory Visit (HOSPITAL_COMMUNITY)
Admission: RE | Admit: 2017-02-10 | Discharge: 2017-02-10 | Disposition: A | Discharge status: Home / Self Care | Payer: Medicare Other | Source: Ambulatory Visit | Attending: Oncology | Admitting: Oncology

## 2017-02-10 DIAGNOSIS — C50911 Malignant neoplasm of unspecified site of right female breast: Secondary | ICD-10-CM | POA: Insufficient documentation

## 2017-02-10 DIAGNOSIS — Z79899 Other long term (current) drug therapy: Secondary | ICD-10-CM | POA: Insufficient documentation

## 2017-02-10 DIAGNOSIS — C7951 Secondary malignant neoplasm of bone: Secondary | ICD-10-CM | POA: Insufficient documentation

## 2017-02-10 DIAGNOSIS — C3492 Malignant neoplasm of unspecified part of left bronchus or lung: Secondary | ICD-10-CM | POA: Insufficient documentation

## 2017-02-10 DIAGNOSIS — Z170 Estrogen receptor positive status [ER+]: Secondary | ICD-10-CM | POA: Diagnosis not present

## 2017-02-10 DIAGNOSIS — C50412 Malignant neoplasm of upper-outer quadrant of left female breast: Secondary | ICD-10-CM | POA: Insufficient documentation

## 2017-02-10 DIAGNOSIS — C50919 Malignant neoplasm of unspecified site of unspecified female breast: Secondary | ICD-10-CM | POA: Diagnosis not present

## 2017-02-10 LAB — GLUCOSE, CAPILLARY: Glucose-Capillary: 107 mg/dL — ABNORMAL HIGH (ref 65–99)

## 2017-02-10 MED ORDER — FLUDEOXYGLUCOSE F - 18 (FDG) INJECTION
6.2000 | Freq: Once | INTRAVENOUS | Status: AC
  Administered 2017-02-10: 6.2 via INTRAVENOUS

## 2017-02-12 ENCOUNTER — Ambulatory Visit (HOSPITAL_BASED_OUTPATIENT_CLINIC_OR_DEPARTMENT_OTHER): Payer: Medicare Other

## 2017-02-12 ENCOUNTER — Ambulatory Visit: Payer: Medicare Other

## 2017-02-12 ENCOUNTER — Ambulatory Visit (HOSPITAL_BASED_OUTPATIENT_CLINIC_OR_DEPARTMENT_OTHER): Payer: Medicare Other | Admitting: Oncology

## 2017-02-12 ENCOUNTER — Other Ambulatory Visit: Payer: Medicare Other

## 2017-02-12 VITALS — BP 131/47 | HR 76 | Temp 98.5°F | Resp 18 | Wt 119.4 lb

## 2017-02-12 DIAGNOSIS — C50912 Malignant neoplasm of unspecified site of left female breast: Secondary | ICD-10-CM

## 2017-02-12 DIAGNOSIS — C773 Secondary and unspecified malignant neoplasm of axilla and upper limb lymph nodes: Secondary | ICD-10-CM | POA: Diagnosis not present

## 2017-02-12 DIAGNOSIS — Z5111 Encounter for antineoplastic chemotherapy: Secondary | ICD-10-CM

## 2017-02-12 DIAGNOSIS — C50811 Malignant neoplasm of overlapping sites of right female breast: Secondary | ICD-10-CM

## 2017-02-12 DIAGNOSIS — C3492 Malignant neoplasm of unspecified part of left bronchus or lung: Secondary | ICD-10-CM

## 2017-02-12 DIAGNOSIS — C50412 Malignant neoplasm of upper-outer quadrant of left female breast: Secondary | ICD-10-CM

## 2017-02-12 DIAGNOSIS — Z170 Estrogen receptor positive status [ER+]: Secondary | ICD-10-CM

## 2017-02-12 DIAGNOSIS — C7951 Secondary malignant neoplasm of bone: Secondary | ICD-10-CM

## 2017-02-12 LAB — CBC WITH DIFFERENTIAL/PLATELET
BASO%: 0.4 % (ref 0.0–2.0)
BASOS ABS: 0 10*3/uL (ref 0.0–0.1)
EOS ABS: 0.1 10*3/uL (ref 0.0–0.5)
EOS%: 1.6 % (ref 0.0–7.0)
HCT: 39.4 % (ref 34.8–46.6)
HGB: 13.1 g/dL (ref 11.6–15.9)
LYMPH%: 7.2 % — ABNORMAL LOW (ref 14.0–49.7)
MCH: 29.9 pg (ref 25.1–34.0)
MCHC: 33.1 g/dL (ref 31.5–36.0)
MCV: 90.1 fL (ref 79.5–101.0)
MONO#: 0.7 10*3/uL (ref 0.1–0.9)
MONO%: 9.5 % (ref 0.0–14.0)
NEUT%: 81.3 % — AB (ref 38.4–76.8)
NEUTROS ABS: 6.4 10*3/uL (ref 1.5–6.5)
PLATELETS: 256 10*3/uL (ref 145–400)
RBC: 4.37 10*6/uL (ref 3.70–5.45)
RDW: 14.6 % — ABNORMAL HIGH (ref 11.2–14.5)
WBC: 7.9 10*3/uL (ref 3.9–10.3)
lymph#: 0.6 10*3/uL — ABNORMAL LOW (ref 0.9–3.3)

## 2017-02-12 LAB — COMPREHENSIVE METABOLIC PANEL
ALT: 16 U/L (ref 0–55)
ANION GAP: 7 meq/L (ref 3–11)
AST: 21 U/L (ref 5–34)
Albumin: 3.3 g/dL — ABNORMAL LOW (ref 3.5–5.0)
Alkaline Phosphatase: 80 U/L (ref 40–150)
BILIRUBIN TOTAL: 0.88 mg/dL (ref 0.20–1.20)
BUN: 13.9 mg/dL (ref 7.0–26.0)
CHLORIDE: 102 meq/L (ref 98–109)
CO2: 28 meq/L (ref 22–29)
Calcium: 8.8 mg/dL (ref 8.4–10.4)
Creatinine: 0.9 mg/dL (ref 0.6–1.1)
EGFR: 61 mL/min/{1.73_m2} — ABNORMAL LOW (ref >90)
GLUCOSE: 175 mg/dL — AB (ref 70–140)
POTASSIUM: 4 meq/L (ref 3.5–5.1)
Sodium: 137 mEq/L (ref 136–145)
TOTAL PROTEIN: 6.2 g/dL — AB (ref 6.4–8.3)

## 2017-02-12 MED ORDER — FULVESTRANT 250 MG/5ML IM SOLN
500.0000 mg | Freq: Once | INTRAMUSCULAR | Status: AC
  Administered 2017-02-12: 500 mg via INTRAMUSCULAR
  Filled 2017-02-12: qty 10

## 2017-02-12 NOTE — Patient Instructions (Signed)

## 2017-02-12 NOTE — Progress Notes (Signed)
[place]  [phone] [phone]   ID: LAURABELLE GORCZYCA DOB: 01-Apr-1978  MR#: [id number]  LNL#:[id number]  Patient Care Team: Dorena Cookey, MD as PCP - General (Family Medicine) Fanny Skates, MD as Consulting Physician (General Surgery) Chauncey Cruel, MD as Consulting Physician (Oncology) [name], MD as Consulting Physician (Radiation Oncology) Rockwell Germany, RN as Registered Nurse Mauro Kaufmann, RN as Registered Nurse Dominic Jethro Bastos, MD as Consulting Physician (Family Medicine) PCP: Joycelyn Man, MD GYN: OTHER MD: Danton Sewer MD, Thompson Grayer MD, Unice Bailey MD, Rolm Bookbinder MD  CHIEF COMPLAINT: bilateral estrogen receptor positive breast cancer, metastatic to bone  CURRENT TREATMENT: fulvestrant, zolendronate, letrozole  NOTE: Patient requests her daughter [name] be contacted regarding all appointments and procedures. [name] can be reached at [phone]  INTERVAL HISTORY: Dot returns today for follow-up of her metastatic breast cancer accompanied by her daughter. Dot is taking letrozole, which she tolerates well. Hot flashes and vaginal dryness are not a major issue. She never developed the arthralgias or myalgias that many patients can experience on this medication. She obtains it at a good price.  She also has no complaints regarding her fulvestrant.  She tolerates the zolendronate with no side effects that she is aware Of.  Since her last visit here she was restaged with a PET scan, which shows no significant change in the size of the measurable disease, but a significant decrease in the SUV. This indicates response.  REVIEW OF SYSTEMS: Dot is doing very well as far as her breast cancer and its treatment is concerned. The big problem she has is her knee. She is now using a cane. This is keeping her from getting around as well as she did previously. Aside from that a detailed review of systems today was  stable  BREAST CANCER HISTORY: From the original intake note:  "Dot"  Has been aware of a sore right nipple for the last 6-8 months. She attributed it to her washing machine, which rubs that area when she uses it. On  11/21/2014 she saw Dr. 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 She mentioned the chest wall problem and Dr. Ubaldo Glassing obtained to skin biopsies 1 from the right inferior central chest and the other one of from the right mid medial chest.  The second of these showed a nodular basal cell carcinoma. The first one however showed invasive carcinoma consistent with a breast primary. This tumor was estrogen receptor positive at 100%, progesterone receptor positive at 36%, both with strong staining intensity, and HER-2 was amplified , the signals ratio being 6.2. [name] [id number])  On 12/06/2014 the patient underwent bilateral diagnostic mammography with bilateral ultrasonography. Breast density was category B. There was bilateral nipple retraction. In the left breast upper outer quadrant there was a spiculated mass noted by mammography with a second spiculated mass in the lower inner quadrant. By ultrasound there was a 1.6 cm hypoechoic lesion at the 1:30 o'clock position in the left breast, and a second mass more medially measuring 0.9 cm. Also at the 8:00 position in the left breast 2 cm from the nipple there was a 1.3 cm mass. Ultrasound of the left axilla showed an enlarged lymph node with fatty hilum replacement, measuring 1.4 cm.  In the right breast mammography showed a cluster of indeterminate microcalcifications in the upper outer quadrant and a retroareolar mass, which bile does sound measured 2.9 cm. Ultrasound of the right  axilla showed a right axilla lymph node measuring 1.4 cm.  On 12/06/2014 the patient underwent needle core biopsy of 2 separate masses in the left breast (at 1:00 and 8:00) as well as the enlarged axillary lymph node. All 3 were positive for  an invasive lobular carcinoma (E-cadherin negative). All 3 tumors were estrogen receptor positive with strong staining intensity (between 87% and 100%). The lymph node was progesterone receptor negative, the other 2 masses being progesterone receptor positive at 9% and at 76%. This cancer was reported to be HER-2 positive at conference today, though I do not have the ratios at hand.  The patient is not an MRI candidate because she has a permanent pacemaker in place. Her subsequent history is as detailed below    PAST MEDICAL HISTORY: Past Medical History:  Diagnosis Date  . Asthma   . Breast cancer ([place]) 12/08/2014   Bilateral  . Cancer of central portion of female breast ([place]) 12/04/2014  . Cancer of central portion of female breast ([facility]) 12/04/2014  . Complete heart block ([facility]) 1999   s/p PPM by Dr Olevia Perches, most recent generator change 2009  . COPD (chronic obstructive pulmonary disease) ([place])   . Diverticulosis   . DJD (degenerative joint disease)   . Esophageal stricture   . GERD (gastroesophageal reflux disease)   . Giant cell arteritis ([place] [place])   . Goiter   . Hemorrhoids   . Hiatal hernia   . IBS (irritable bowel syndrome)   . Lung cancer ([place])   . Paroxysmal atrial fibrillation ([place]) 12/17/2015   asymptomatic, <1%, determined on regular pacemaker interrogation  . Pneumonia   . Renal cyst   . S/P radiation therapy 04/24/15 completed   T-11  . Wears glasses     PAST SURGICAL HISTORY: Past Surgical History:  Procedure Laterality Date  . ANAL FISSURE REPAIR    . ARTERY BIOPSY Left 01/03/2013   Procedure: BIOPSY LEFT TEMPORAL ARTERY;  Surgeon: Ascencion Dike, MD;  Location: [facility];  Service: ENT;  Laterality: Left;  . BREAST BIOPSY Right   . CHOLECYSTECTOMY    . COLONOSCOPY    . FRACTURE SURGERY     fx lt wrist  . hemorrhoidectomy    . left lower lobectomy for brochiectasis  1950  . MOUTH SURGERY    . PACEMAKER INSERTION  1999   Gen change ([facility]) by Dr Olevia Perches  2009  . RECTAL POLYPECTOMY    . RENAL CYST EXCISION    . TONSILLECTOMY      FAMILY HISTORY Family History  Problem Relation Age of Onset  . Heart disease Mother   . Breast cancer Mother   . Stomach cancer Father   . Alcohol abuse Father   . Seizures Son   . Thyroid disease Sister    the patient's father died at the age of 8, with stomach cancer. The patient's mother died at the age of 73 from a myocardial infarction. She had been diagnosed with breast cancer in her 49s. The patient had no brothers, 2 sisters. There is no other history of breast or ovarian cancer in the family to the patient's knowledge  GYNECOLOGIC HISTORY:  No LMP recorded. Patient is postmenopausal. Menarche age 42, first live birth age 25. The patient is GX P4. She underwent menopause in her late 36s. She did not take hormone replacement.  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 She is a widow, lives by herself, with  no pets. Daughter Wendy Poet lives in [place] where she is Mudlogger of a preschool and day care. Son Donda Friedli lives in [place] where he owns a furniture to sign store. Daughter Shelly Rubenstein lives in [place]. She owns a Chiropractor. Son Randall Hiss is physically and mentally disabled and lives in a group home in [place]. The patient has 7 grandchildren and 2 great-grandchildren. She attends a local [employer]: In place; the patient does not wish to be resuscitated in case of a terminal event; the patient's daughter [name] is her healthcare power of attorney. She can be reached at [phone]. The patient has a DO NOT RESUSCITATE order in place at [facility].  HEALTH MAINTENANCE: Social History  Substance Use Topics  . Smoking status: Never Smoker  . Smokeless tobacco: Never Used  . Alcohol use 0.0 oz/week     Comment: Occasional      Colonoscopy:  PAP:  Bone density:02/19/2015, osteoporosis  Lipid panel:  Allergies    Allergen Reactions  . Morphine And Related Nausea Only  . Amoxicillin-Pot Clavulanate Other (See Comments)    Unknown     Current Outpatient Prescriptions  Medication Sig Dispense Refill  . Calcium Citrate-Vitamin D (CITRACAL PETITES/VITAMIN D) 200-250 MG-UNIT TABS Take 1 tablet by mouth daily.      . diclofenac sodium (VOLTAREN) 1 % GEL Apply topically.    Marland Kitchen HYDROcodone-acetaminophen (NORCO/VICODIN) 5-325 MG tablet Take 1-2 tablets by mouth every 4 (four) hours as needed for severe pain. 60 tablet 0  . letrozole (FEMARA) 2.5 MG tablet Take 1 tablet (2.5 mg total) by mouth daily. 90 tablet 4  . metoprolol succinate (TOPROL-XL) 100 MG 24 hr tablet TAKE 1/2 (ONE-HALF) TABLET BY MOUTH EVERY MORNING. Take with or immediately following a meal. 45 tablet 3  . predniSONE (DELTASONE) 5 MG tablet Take 10 mg by mouth daily with breakfast. Reported on 11/19/2015    . traMADol (ULTRAM) 50 MG tablet Take 1 tablet (50 mg total) by mouth every 6 (six) hours as needed for moderate pain or severe pain. Reported on 11/19/2015 30 tablet 3  . TURMERIC PO Take 1 capsule by mouth daily.     No current facility-administered medications for this visit.     OBJECTIVE: Elderly white woman Who appears stated age  2:   02/12/17 1100  BP: (!) 131/47  Pulse: 76  Resp: 18  Temp: 98.5 F (36.9 C)     Body mass index is 22.56 kg/m.    ECOG FS:2 - Symptomatic, <50% confined to bed  Sclerae unicteric, pupils round and equal Oropharynx clear and moist No cervical or supraclavicular adenopathy Lungs no rales or rhonchi Heart regular rate and rhythm Abd soft, nontender, positive bowel sounds MSK no focal spinal tenderness, no upper extremity lymphedema Neuro: nonfocal, well oriented, appropriate affect Breasts: The right nipple is still minimally involved, but it appears pink, not read and not swollen. The periareolar mass is a little softer, though still palpable. The left breast is unremarkable. Both axillae  are benign.  LAB RESULTS:  CMP     Component Value Date/Time   NA 136 01/08/2017 0914   K 3.5 01/08/2017 0914   CL 100 (L) 11/29/2016 1749   CO2 25 01/08/2017 0914   GLUCOSE 135 01/08/2017 0914   BUN 13.8 01/08/2017 0914   CREATININE 0.8 01/08/2017 0914   CALCIUM 9.0 01/08/2017 0914   PROT 6.6 01/08/2017 0914   ALBUMIN 3.6 01/08/2017 0914  AST 25 01/08/2017 0914   ALT 23 01/08/2017 0914   ALKPHOS 80 01/08/2017 0914   BILITOT 1.22 (H) 01/08/2017 0914   GFRNONAA >60 11/29/2016 1749   GFRAA >60 11/29/2016 1749    INo results found for: SPEP, UPEP  Lab Results  Component Value Date   WBC 6.3 01/08/2017   NEUTROABS 4.3 01/08/2017   HGB 13.5 01/08/2017   HCT 40.8 01/08/2017   MCV 90.5 01/08/2017   PLT 252 01/08/2017      Chemistry      Component Value Date/Time   NA 136 01/08/2017 0914   K 3.5 01/08/2017 0914   CL 100 (L) 11/29/2016 1749   CO2 25 01/08/2017 0914   BUN 13.8 01/08/2017 0914   CREATININE 0.8 01/08/2017 0914      Component Value Date/Time   CALCIUM 9.0 01/08/2017 0914   ALKPHOS 80 01/08/2017 0914   AST 25 01/08/2017 0914   ALT 23 01/08/2017 0914   BILITOT 1.22 (H) 01/08/2017 0914       Lab Results  Component Value Date   LABCA2 20 04/01/2016    No components found for: [id number]  No results for input(s): INR in the last 168 hours.  Urinalysis    Component Value Date/Time   COLORURINE COLORLESS (A) 11/29/2016 1707   APPEARANCEUR CLEAR 11/29/2016 1707   LABSPEC 1.002 (L) 11/29/2016 1707   PHURINE 7.0 11/29/2016 1707   GLUCOSEU NEGATIVE 11/29/2016 1707   HGBUR SMALL (A) 11/29/2016 1707   HGBUR trace-intact 01/17/2010 0932   BILIRUBINUR NEGATIVE 11/29/2016 1707   BILIRUBINUR n 09/01/2011 1157   KETONESUR NEGATIVE 11/29/2016 1707   PROTEINUR NEGATIVE 11/29/2016 1707   UROBILINOGEN 0.2 09/27/2014 1800   NITRITE NEGATIVE 11/29/2016 1707   LEUKOCYTESUR NEGATIVE 11/29/2016 1707    STUDIES: Nm Pet Image Initial (pi) Skull Base To  Thigh  Result Date: 02/10/2017 CLINICAL DATA:  Subsequent treatment strategy for bilateral breast cancer metastatic to bone. Hypermetabolic left lung lesion. Assess treatment options. EXAM: NUCLEAR MEDICINE PET SKULL BASE TO THIGH TECHNIQUE: 6.2 mCi F-18 FDG was injected intravenously. Full-ring PET imaging was performed from the skull base to thigh after the radiotracer. CT data was obtained and used for attenuation correction and anatomic localization. FASTING BLOOD GLUCOSE:  Value: 107 mg/dl COMPARISON:  PET-CT 10/15/2016 and 12/19/2014. FINDINGS: NECK No hypermetabolic cervical lymph nodes are identified.There are no lesions of the pharyngeal mucosal space. Stable mild thyroid nodularity without abnormal metabolic activity. CHEST Hypermetabolic breast masses are again noted bilaterally. Subareolar right breast mass measures approximately 2.9 x 2.2 cm and has an SUV max of 11.2 (previously 19.8). Small hypermetabolic left breast mass measures 2.1 x 1.1 cm on image 59 and has an SUV max of 3.7 (previously 6.5). Hypermetabolic right axillary and subpectoral adenopathy has mildly improved. The dominant node on image 58 has an SUV max of 8.6 (previously 13.6). There is no hypermetabolic mediastinal or hilar adenopathy. Postsurgical changes are again noted within the left lung. The sub solid mass posteriorly in the left upper lobe is similar in size to the previous study, measuring 3.3 x 1.7 cm on image 48 (3.2 x 1.8 cm previously). Its hypermetabolic activity has mildly improved (SUV max 2.4 currently, 5.1 previously). There are no new or enlarging pulmonary nodules. Right upper lobe calcified granuloma, hiatal hernia and pacemaker noted. ABDOMEN/PELVIS There is no hypermetabolic activity within the liver, adrenal glands, spleen or pancreas. There is no hypermetabolic nodal activity. Prior cholecystectomy, aortoiliac atherosclerosis and sigmoid diverticulosis are noted. SKELETON  There is no hypermetabolic activity  to suggest osseous metastatic disease. There is no residual hypermetabolic activity within the radiated T11 lesion which remains sclerotic. There is a surrounding band of decreased metabolic activity attributed to radiation therapy. IMPRESSION: 1. Positive response to interval therapy. There is decreased metabolic activity within the bilateral breast masses and right axillary lymphadenopathy. 2. The sub solid left lung lesion also demonstrates mildly decreased metabolic activity in the interval. 3. No disease progression identified. Electronically Signed   By: Richardean Sale M.D.   On: 02/10/2017 15:22    ASSESSMENT: 83 y.o.  woman with bilateral breast cancer, as follows:  (1) status post right breastt overlapping sites skin biopsy 11/21/2014 for a clinical T4 N1, stage IIIB invasive ductal carcinoma, estrogen and progesterone receptor positive, HER-2 amplified  (2) status post 2 separate left breast upper outer quadrant biopsies and one left axillary lymph node biopsy 12/04/2014, all positive for a clinical mT1c N1, stage IIA invasive lobular carcinoma, estrogen receptor positive, progesterone receptor variable, with MIB-1 between 10 and 40%, and no HER-2 amplification  (3) neoadjuvant anastrozole started 12/14/2014. Discontinued with progression by repeat left breast US May 2017  (4) definitive surgery to consist of bilateral mastectomies without formal axillary dissection: Postponed  METASTATIC DISEASE: March 2016 (5) PET scan 12/19/2014 shows in addition to the bilateral breast and bilateral axillary masses, a lesion at the T11 vertebral body, showing an irregular lucency on prior CT exam  (a) adjuvant radiationt to T10-T12: 04/11/2015 through 04/24/2015 to a total dose of 30 gray in 10 fractions  (6) consider anti-HER-2 treatment depending on response to antiestrogen therapy alone.    (7) osteoporosis with T score of -2.6 on bone scan obtained 02/19/2015.  (a) zolendronate started  03/20/2015, repeated every 12 weeks   (8) fulvestrant started 03/04/16  (a) patient refused adding palbociclib  (b) PET scan in 10/15/2016 showed mild increase in the right breast mass and increased size and activity of 3.2 cm of solid left lung nodule  (c) letrozole added 10/16/2016  (d) PET scan obtained 02/10/2017 showing a 6 significant decrease in SUV in the measurable disease  (9) Doppler ultrasound both lower extremities 11/17/2016 and repeat 02/11/2018are  negative  PLAN: I am delighted that.is showing a response to her treatment. She is tolerating the treatments remarkably well. The plan is to continue as we are doing and then consider restaging in 3-6 months depending on the clinical situation.  Her advanced directives have been clarified and she now has a DO NOT RESUSCITATE order at [facility], which is her wish and is also appropriate given her age and overall situation.  She will continue her monthly visits here for her [place]. She will see me again in July. She knows to call for any problems that may develop before that visit.

## 2017-02-12 NOTE — Progress Notes (Signed)
Per Dr. Jana Hakim no Zometa today, pt to receive Zometa 02/18/17.

## 2017-02-15 ENCOUNTER — Non-Acute Institutional Stay: Payer: Medicare Other | Admitting: Internal Medicine

## 2017-02-15 ENCOUNTER — Encounter: Payer: Self-pay | Admitting: Internal Medicine

## 2017-02-15 DIAGNOSIS — C50912 Malignant neoplasm of unspecified site of left female breast: Secondary | ICD-10-CM

## 2017-02-15 DIAGNOSIS — R413 Other amnesia: Secondary | ICD-10-CM

## 2017-02-15 DIAGNOSIS — J479 Bronchiectasis, uncomplicated: Secondary | ICD-10-CM

## 2017-02-15 DIAGNOSIS — C3492 Malignant neoplasm of unspecified part of left bronchus or lung: Secondary | ICD-10-CM

## 2017-02-15 DIAGNOSIS — K580 Irritable bowel syndrome with diarrhea: Secondary | ICD-10-CM

## 2017-02-15 DIAGNOSIS — C7951 Secondary malignant neoplasm of bone: Secondary | ICD-10-CM | POA: Diagnosis not present

## 2017-02-15 DIAGNOSIS — R197 Diarrhea, unspecified: Secondary | ICD-10-CM

## 2017-02-15 DIAGNOSIS — E049 Nontoxic goiter, unspecified: Secondary | ICD-10-CM

## 2017-02-15 DIAGNOSIS — R740 Nonspecific elevation of levels of transaminase and lactic acid dehydrogenase [LDH]: Secondary | ICD-10-CM

## 2017-02-15 DIAGNOSIS — R739 Hyperglycemia, unspecified: Secondary | ICD-10-CM

## 2017-02-15 DIAGNOSIS — R7401 Elevation of levels of liver transaminase levels: Secondary | ICD-10-CM

## 2017-02-15 NOTE — Progress Notes (Signed)
History and Physical      Location:  [facility] Number: [phone] Place of Service:  ALF [phone])  PCP: Jeanmarie Hubert, MD Patient Care Team: Estill Dooms, MD as PCP - General (Internal Medicine) Fanny Skates, MD as Consulting Physician (General Surgery) Chauncey Cruel, MD as Consulting Physician (Oncology) [name], MD as Consulting Physician (Radiation Oncology) Rockwell Germany, RN as Registered Nurse Mauro Kaufmann, RN as Registered Nurse Dominic Jethro Bastos, MD as Consulting Physician (Family Medicine) Man Otho Darner, NP as Nurse Practitioner (Internal Medicine)  Extended Emergency Contact Information Primary Emergency Contact: Matthews,[name] Address: [address]          [place], [place] [address] [place] Mobile Phone: [phone] Relation: Daughter Secondary Emergency Contact: Clent [place] Phone: [phone] Relation: Other  Code Status: Full Code Goals of Care: Advanced Directive information Advanced Directives 02/15/2017  Does Patient Have a Medical Advance Directive? Yes  Type of Advance Directive [place]  Does patient want to make changes to medical advance directive? -  Copy of [place] in Chart? Yes  Would patient like information on creating a medical advance directive? -  Pre-existing out of facility DNR order (yellow form or pink MOST form) -      Chief Complaint  Patient presents with  . New Admit to AL    HPI: Patient is a 83 y.o. female seen today for admission on 12/29/16 to [facility].   Memory loss - still able to converse. MMSE was 27/30. Very poor recall of her medical history.  Bronchiectasis without complication ([place]) - chronically dyspneic. No cough at present.  Carcinoma of left breast metastatic to bone [facility]) =- sees Dr. Jana Hakim. Last visit 02/12/17.Marland Kitchen  "On 12/06/2014 the patient underwent needle core biopsy of 2 separate  masses in the left breast (at 1:00 and 8:00) as well as the enlarged axillary lymph node. All 3 were positive for an invasive lobular carcinoma (E-cadherin negative). All 3 tumors were estrogen receptor positive with strong staining intensity (between 87% and 100%). The lymph node was progesterone receptor negative, the other 2 masses being progesterone receptor positive at 9% and at 76%. This cancer was reported to be HER-2 positive"  Goiter - non tender  Irritable bowel syndrome with diarrhea - active and distressing problem  Diarrhea, unspecified type - associated with IBS  Malignant neoplasm of left lung, unspecified part of lung (HCC)  Transaminitis     Past Medical History:  Diagnosis Date  . Asthma   . Breast cancer ([place]) 12/08/2014   Bilateral  . Cancer of central portion of female breast ([place]) 12/04/2014  . Cancer of central portion of female breast ([place]) 12/04/2014  . Complete heart block ([place]) 1999   s/p PPM by Dr Olevia Perches, most recent generator change 2009  . COPD (chronic obstructive pulmonary disease) ([place])   . Diverticulosis   . DJD (degenerative joint disease)   . Esophageal stricture   . GERD (gastroesophageal reflux disease)   . Giant cell arteritis ([place])   . Goiter   . Hemorrhoids   . Hiatal hernia   . IBS (irritable bowel syndrome)   . Lung cancer ([place])   . Paroxysmal atrial fibrillation ([place]) 12/17/2015   asymptomatic, <1%, determined on regular pacemaker interrogation  . Pneumonia   . Renal cyst   . S/P radiation therapy 04/24/15 completed   T-11  . Wears glasses  Past Surgical History:  Procedure Laterality Date  . ANAL FISSURE REPAIR    . ARTERY BIOPSY Left 01/03/2013   Procedure: BIOPSY LEFT TEMPORAL ARTERY;  Surgeon: Ascencion Dike, MD;  Location: [facility];  Service: ENT;  Laterality: Left;  . BREAST BIOPSY Right   . CHOLECYSTECTOMY    . COLONOSCOPY    . FRACTURE SURGERY     fx lt wrist  . hemorrhoidectomy    . left lower  lobectomy for brochiectasis  1950  . MOUTH SURGERY    . PACEMAKER INSERTION  1999   Gen change ([facility]) by Dr Olevia Perches 2009  . RECTAL POLYPECTOMY    . RENAL CYST EXCISION    . TONSILLECTOMY      reports that she has never smoked. She has never used smokeless tobacco. She reports that she drinks alcohol. She reports that she does not use drugs. Social History   Social History  . Marital status: Married    Spouse name: N/A  . Number of children: 4  . Years of education: N/A   Occupational History  . Retired    Social History Main Topics  . Smoking status: Never Smoker  . Smokeless tobacco: Never Used  . Alcohol use 0.0 oz/week     Comment: Occasional   . Drug use: No  . Sexual activity: Not Currently   Other Topics Concern  . Not on file   Social History Narrative   Social History     Marital status: Widowed          Spouse name:                        Years of education:                 Number of children: 4           Occupational History   Occupation: Copywriter, advertising                Retired : Yes                                      Social History Main Topics     Smoking status: Never Smoker                                                                  Smokeless tobacco: Never Used                         Alcohol use:         0.0 oz/week        Comment:      Drug use: No               Sexual activity:        Other Topics            Concern     None on file      Social History Narrative     Do you drink/eat  things with caffeine?    Yes     Marital status: Widowed.  What year were you married? 1948     Do you have a living will? Yes     Do you have DNR form? Yes     Do you have signed POA/HPOA forms? Yes                Functional Status Survey:    Family History  Problem Relation Age of Onset  . Heart disease Mother   . Breast cancer Mother   . Stomach cancer Father   . Alcohol abuse Father   . Seizures Son   . Thyroid  disease Sister     Health Maintenance  Topic Date Due  . PNA vac Low Risk Adult (2 of 2 - PCV13) 01/18/2011  . INFLUENZA VACCINE  06/19/2017 (Originally 05/19/2017)  . TETANUS/TDAP  07/24/2017  . DEXA SCAN  Completed    Allergies  Allergen Reactions  . Morphine And Related Nausea Only  . Amoxicillin-Pot Clavulanate Other (See Comments)    Unknown     Outpatient Encounter Prescriptions as of 02/15/2017  Medication Sig  . Calcium Citrate-Vitamin D (CITRACAL PETITES/VITAMIN D) 200-250 MG-UNIT TABS Take 1 tablet by mouth daily.    . Diclofenac Sodium (PENNSAID) 2 % SOLN Place onto the skin. Apply topical twice a day as needed  . diclofenac sodium (VOLTAREN) 1 % GEL Apply topically. Three times a day as needed  . HYDROcodone-acetaminophen (NORCO/VICODIN) 5-325 MG tablet Take 1 tablet by mouth. Take one every 4 hours as needed for pain  . letrozole (FEMARA) 2.5 MG tablet Take 1 tablet (2.5 mg total) by mouth daily.  . metoprolol succinate (TOPROL-XL) 100 MG 24 hr tablet TAKE 1/2 (ONE-HALF) TABLET BY MOUTH EVERY MORNING. Take with or immediately following a meal.  . predniSONE (DELTASONE) 5 MG tablet Take 5 mg by mouth daily with breakfast. Reported on 11/19/2015  . traMADol (ULTRAM) 50 MG tablet Take 50 mg by mouth. Take one tablet every 8 hours as needed  . TURMERIC PO Take 1 capsule by mouth daily.  . [DISCONTINUED] HYDROcodone-acetaminophen (NORCO/VICODIN) 5-325 MG tablet Take 1-2 tablets by mouth every 4 (four) hours as needed for severe pain. (Patient taking differently: Take 1 tablet by mouth every 4 (four) hours as needed for severe pain. )  . [DISCONTINUED] traMADol (ULTRAM) 50 MG tablet Take 1 tablet (50 mg total) by mouth every 6 (six) hours as needed for moderate pain or severe pain. Reported on 11/19/2015   No facility-administered encounter medications on file as of 02/15/2017.     Review of Systems  Constitutional: Negative for fever and unexpected weight change.  HENT:  Negative for congestion, dental problem, ear pain, nosebleeds, postnasal drip, rhinorrhea, sinus pressure, sneezing, sore throat and trouble swallowing.   Eyes: Negative for redness and itching.  Respiratory: Negative for cough, chest tightness, shortness of breath and wheezing.   Cardiovascular: Negative for palpitations and leg swelling.       Pacemaker  Gastrointestinal: Negative for nausea and vomiting.  Genitourinary: Negative for frequency, hematuria, pelvic pain and urgency.  Musculoskeletal: Positive for arthralgias. Negative for joint swelling.       12/2016 Left knee medial meniscus injury  Skin: Negative for rash.       Scars from R+L breast surgeries.   Neurological: Negative for headaches.  Hematological: Does not bruise/bleed easily.  Psychiatric/Behavioral: Negative for dysphoric mood. The patient is not nervous/anxious.     Vitals:  02/15/17 1514  BP: 120/64  Pulse: 64  Resp: 16  Temp: 97.8 F (36.6 C)  SpO2: 97%  Weight: 118 lb 9.6 oz (53.8 kg)  Height: 5' (1.524 m)   Body mass index is 23.16 kg/m. Physical Exam  Constitutional: She is oriented to person, place, and time. She appears well-developed and well-nourished.  HENT:  Head: Normocephalic and atraumatic.  Right Ear: External ear normal.  Left Ear: External ear normal.  Nose: Nose normal.  Mouth/Throat: No oropharyngeal exudate.  Eyes: Conjunctivae and EOM are normal. Pupils are equal, round, and reactive to light. Right eye exhibits no discharge. Left eye exhibits no discharge. No scleral icterus.  Neck: Normal range of motion. Neck supple. No JVD present. No tracheal deviation present.  Cardiovascular: Normal rate, regular rhythm, normal heart sounds and intact distal pulses.  Exam reveals no friction rub.   No murmur heard. pacemaker  Pulmonary/Chest: No stridor. No respiratory distress. She has no wheezes. She has no rales.  Abdominal: She exhibits no distension. There is no tenderness. There is  no rebound and no guarding.  Genitourinary: Rectal exam shows guaiac positive stool.  Musculoskeletal: Normal range of motion. She exhibits no edema, tenderness or deformity.  Lymphadenopathy:    She has no cervical adenopathy.  Neurological: She is alert and oriented to person, place, and time. She has normal reflexes. No cranial nerve deficit. She exhibits normal muscle tone. Coordination normal.  Skin: Skin is warm and dry. No rash noted. No erythema. No pallor.  Surgical scars  R+L breast   Psychiatric: She has a normal mood and affect. Her behavior is normal. Judgment and thought content normal.    Labs reviewed: Basic Metabolic Panel:  Recent Labs  11/29/16 1749 12/11/16 1101 01/08/17 0914 02/12/17 1047  NA 136 135* 136 137  K 4.1 4.0 3.5 4.0  CL 100*  --   --   --   CO2 '30 25 25 28  '$ GLUCOSE 100* 86 135 175*  BUN 12 22.3 13.8 13.9  CREATININE 0.61 0.9 0.8 0.9  CALCIUM 9.4 9.6 9.0 8.8   Liver Function Tests:  Recent Labs  12/11/16 1101 01/08/17 0914 02/12/17 1047  AST '24 25 21  '$ ALT '22 23 16  '$ ALKPHOS 85 80 80  BILITOT 1.47* 1.22* 0.88  PROT 6.6 6.6 6.2*  ALBUMIN 3.6 3.6 3.3*    Recent Labs  11/29/16 1749  LIPASE 29   No results for input(s): AMMONIA in the last 8760 hours. CBC:  Recent Labs  12/11/16 1101 01/08/17 0914 02/12/17 1047  WBC 10.2 6.3 7.9  NEUTROABS 7.2* 4.3 6.4  HGB 14.5 13.5 13.1  HCT 43.0 40.8 39.4  MCV 89.4 90.5 90.1  PLT 210 252 256   Cardiac Enzymes: No results for input(s): CKTOTAL, CKMB, CKMBINDEX, TROPONINI in the last 8760 hours. BNP: Invalid input(s): POCBNP No results found for: HGBA1C Lab Results  Component Value Date   TSH 0.33 (L) 12/13/2012   No results found for: VITAMINB12 No results found for: FOLATE No results found for: IRON, TIBC, FERRITIN  Imaging and Procedures obtained prior to SNF admission: Nm Pet Image Initial (pi) Skull Base To Thigh  Result Date: 02/10/2017 CLINICAL DATA:  Subsequent  treatment strategy for bilateral breast cancer metastatic to bone. Hypermetabolic left lung lesion. Assess treatment options. EXAM: NUCLEAR MEDICINE PET SKULL BASE TO THIGH TECHNIQUE: 6.2 mCi F-18 FDG was injected intravenously. Full-ring PET imaging was performed from the skull base to thigh after the radiotracer. CT data was  obtained and used for attenuation correction and anatomic localization. FASTING BLOOD GLUCOSE:  Value: 107 mg/dl COMPARISON:  PET-CT 10/15/2016 and 12/19/2014. FINDINGS: NECK No hypermetabolic cervical lymph nodes are identified.There are no lesions of the pharyngeal mucosal space. Stable mild thyroid nodularity without abnormal metabolic activity. CHEST Hypermetabolic breast masses are again noted bilaterally. Subareolar right breast mass measures approximately 2.9 x 2.2 cm and has an SUV max of 11.2 (previously 19.8). Small hypermetabolic left breast mass measures 2.1 x 1.1 cm on image 59 and has an SUV max of 3.7 (previously 6.5). Hypermetabolic right axillary and subpectoral adenopathy has mildly improved. The dominant node on image 58 has an SUV max of 8.6 (previously 13.6). There is no hypermetabolic mediastinal or hilar adenopathy. Postsurgical changes are again noted within the left lung. The sub solid mass posteriorly in the left upper lobe is similar in size to the previous study, measuring 3.3 x 1.7 cm on image 48 (3.2 x 1.8 cm previously). Its hypermetabolic activity has mildly improved (SUV max 2.4 currently, 5.1 previously). There are no new or enlarging pulmonary nodules. Right upper lobe calcified granuloma, hiatal hernia and pacemaker noted. ABDOMEN/PELVIS There is no hypermetabolic activity within the liver, adrenal glands, spleen or pancreas. There is no hypermetabolic nodal activity. Prior cholecystectomy, aortoiliac atherosclerosis and sigmoid diverticulosis are noted. SKELETON There is no hypermetabolic activity to suggest osseous metastatic disease. There is no residual  hypermetabolic activity within the radiated T11 lesion which remains sclerotic. There is a surrounding band of decreased metabolic activity attributed to radiation therapy. IMPRESSION: 1. Positive response to interval therapy. There is decreased metabolic activity within the bilateral breast masses and right axillary lymphadenopathy. 2. The sub solid left lung lesion also demonstrates mildly decreased metabolic activity in the interval. 3. No disease progression identified. Electronically Signed   By: Richardean Sale M.D.   On: 02/10/2017 15:22    Assessment/Plan 1. Memory loss observe  2. Bronchiectasis without complication ([place]) stable  3. Carcinoma of left breast metastatic to bone [facility]) Continue with Dr. Jana Hakim.  4. Goiter Non tender.  5. Irritable bowel syndrome with diarrhea Under control at present  6. Diarrhea, unspecified type controlled  7. Malignant neoplasm of left lung, unspecified part of lung ([place]) Continue with Dr. Jana Hakim  8. Transaminitis Normal since Jan 2018  9. Hyperglycemia Noted on lab 02/12/17 with glucose 175. -BMP, A1c

## 2017-02-18 ENCOUNTER — Ambulatory Visit: Payer: Medicare Other

## 2017-02-18 ENCOUNTER — Encounter: Payer: Self-pay | Admitting: Nurse Practitioner

## 2017-02-18 ENCOUNTER — Ambulatory Visit (HOSPITAL_BASED_OUTPATIENT_CLINIC_OR_DEPARTMENT_OTHER): Payer: Medicare Other

## 2017-02-18 VITALS — BP 139/64 | HR 82 | Temp 98.6°F | Resp 16

## 2017-02-18 DIAGNOSIS — C50811 Malignant neoplasm of overlapping sites of right female breast: Secondary | ICD-10-CM | POA: Diagnosis not present

## 2017-02-18 DIAGNOSIS — C50412 Malignant neoplasm of upper-outer quadrant of left female breast: Secondary | ICD-10-CM

## 2017-02-18 DIAGNOSIS — C7951 Secondary malignant neoplasm of bone: Secondary | ICD-10-CM | POA: Diagnosis present

## 2017-02-18 DIAGNOSIS — Z170 Estrogen receptor positive status [ER+]: Principal | ICD-10-CM

## 2017-02-18 MED ORDER — SODIUM CHLORIDE 0.9 % IV SOLN
Freq: Once | INTRAVENOUS | Status: AC
  Administered 2017-02-18: 15:00:00 via INTRAVENOUS

## 2017-02-18 MED ORDER — ZOLEDRONIC ACID 4 MG/5ML IV CONC
3.3000 mg | Freq: Once | INTRAVENOUS | Status: AC
  Administered 2017-02-18: 3.3 mg via INTRAVENOUS
  Filled 2017-02-18: qty 4.13

## 2017-02-18 NOTE — Patient Instructions (Signed)

## 2017-02-19 ENCOUNTER — Ambulatory Visit: Payer: Medicare Other

## 2017-02-19 ENCOUNTER — Other Ambulatory Visit: Payer: Medicare Other

## 2017-02-22 ENCOUNTER — Other Ambulatory Visit: Payer: Medicare Other

## 2017-02-22 ENCOUNTER — Encounter: Payer: Self-pay | Admitting: Neurology

## 2017-02-22 ENCOUNTER — Ambulatory Visit (INDEPENDENT_AMBULATORY_CARE_PROVIDER_SITE_OTHER): Payer: Medicare Other | Admitting: Neurology

## 2017-02-22 VITALS — BP 132/62 | HR 93 | Ht 60.0 in | Wt 119.0 lb

## 2017-02-22 DIAGNOSIS — F0280 Dementia in other diseases classified elsewhere without behavioral disturbance: Secondary | ICD-10-CM

## 2017-02-22 DIAGNOSIS — G301 Alzheimer's disease with late onset: Principal | ICD-10-CM

## 2017-02-22 NOTE — Progress Notes (Signed)
NEUROLOGY CONSULTATION NOTE  [name] MRN: [id number] DOB: [birth year]  Referring provider: Dr. Nyoka Cowden Primary care provider: Dr. Nyoka Cowden  Reason for consult:  Memory loss  HISTORY OF PRESENT ILLNESS: Lauren Buchanan is an 83 year old female with COPD, Complete AV Block/SVT status post pacemaker, temporal arteritis, IBS and metastatic breast cancer to the bone presenting with memory deficits.  She is accompanied by her daughter who supplements history.  Her daughter reports short term memory problems since around 2013, which have mildly progressed.  She forgets conversations or activities she did the previous day.  She has a pillbox set up for her, but she remembers to take her medication herself.  Due to her memory, she moved from independent living to assisted living.  She lives in a small studio with no kitchenette.  She eats all of her meals in the cafeteria.  A nurse came by to visit her for 3 consecutive days and she couldn't remember already meeting her.  Long-term memory is intact.  She recognizes and remembers names of people she knows.  She performs all activities of daily living.  She does very limited driving.  She drives maybe once a week and only during daylight hours.  She only drives to 3 locations, the grocery store (which is only a block away), out for breakfast (which is nearby) and to her son's house (which is a quarter of a mile away).  She denies disorientation driving on these familiar routes.  She denies near accidents.  Her daughter sits and watches her drive about once a month and denies any concerns.  She denies depression.  She has not exhibited any change in behavior, agitation, or hallucinations.    She graduated highschool but had to repeat two grades due to difficulty retaining the material.  She has no known family history of dementia.  She is currently undergoing fulvestrant infusion therapy, as well as zolendronate and letrozole.    PAST MEDICAL  HISTORY: Past Medical History:  Diagnosis Date  . Asthma   . Breast cancer ([place]) 12/08/2014   Bilateral  . Cancer of central portion of female breast ([place]) 12/04/2014  . Cancer of central portion of female breast ([place]) 12/04/2014  . Complete heart block ([place]) 1999   s/p PPM by Dr Olevia Perches, most recent generator change 2009  . COPD (chronic obstructive pulmonary disease) ([place])   . Diverticulosis   . DJD (degenerative joint disease)   . Esophageal stricture   . GERD (gastroesophageal reflux disease)   . Giant cell arteritis ([place])   . Goiter   . Hemorrhoids   . Hiatal hernia   . IBS (irritable bowel syndrome)   . Lung cancer ([place])   . Paroxysmal atrial fibrillation ([place]) 12/17/2015   asymptomatic, <1%, determined on regular pacemaker interrogation  . Pneumonia   . Renal cyst   . S/P radiation therapy 04/24/15 completed   T-11  . Wears glasses     PAST SURGICAL HISTORY: Past Surgical History:  Procedure Laterality Date  . ANAL FISSURE REPAIR    . ARTERY BIOPSY Left 01/03/2013   Procedure: BIOPSY LEFT TEMPORAL ARTERY;  Surgeon: Ascencion Dike, MD;  Location: [facility];  Service: ENT;  Laterality: Left;  . BREAST BIOPSY Right   . CHOLECYSTECTOMY    . COLONOSCOPY    . FRACTURE SURGERY     fx lt wrist  . hemorrhoidectomy    . left lower lobectomy for brochiectasis  1950  . MOUTH  SURGERY    . PACEMAKER INSERTION  1999   Gen change ([facility]) by Dr Olevia Perches 2009  . RECTAL POLYPECTOMY    . RENAL CYST EXCISION    . TONSILLECTOMY      MEDICATIONS: Current Outpatient Prescriptions on File Prior to Visit  Medication Sig Dispense Refill  . Calcium Citrate-Vitamin D (CITRACAL PETITES/VITAMIN D) 200-250 MG-UNIT TABS Take 1 tablet by mouth daily.      . Diclofenac Sodium (PENNSAID) 2 % SOLN Place onto the skin. Apply topical twice a day as needed    . diclofenac sodium (VOLTAREN) 1 % GEL Apply topically. Three times a day as needed    . HYDROcodone-acetaminophen (NORCO/VICODIN)  5-325 MG tablet Take 1 tablet by mouth. Take one every 4 hours as needed for pain    . letrozole (FEMARA) 2.5 MG tablet Take 1 tablet (2.5 mg total) by mouth daily. 90 tablet 4  . metoprolol succinate (TOPROL-XL) 100 MG 24 hr tablet TAKE 1/2 (ONE-HALF) TABLET BY MOUTH EVERY MORNING. Take with or immediately following a meal. 45 tablet 3  . predniSONE (DELTASONE) 5 MG tablet Take 5 mg by mouth daily with breakfast. Reported on 11/19/2015    . traMADol (ULTRAM) 50 MG tablet Take 50 mg by mouth. Take one tablet every 8 hours as needed    . TURMERIC PO Take 1 capsule by mouth daily.    . [DISCONTINUED] loratadine (CLARITIN) 10 MG tablet Take 10 mg by mouth daily.       No current facility-administered medications on file prior to visit.     ALLERGIES: Allergies  Allergen Reactions  . Morphine And Related Nausea Only  . Amoxicillin-Pot Clavulanate Other (See Comments)    Unknown     FAMILY HISTORY: Family History  Problem Relation Age of Onset  . Heart disease Mother   . Breast cancer Mother   . Stomach cancer Father   . Alcohol abuse Father   . Seizures Son   . Thyroid disease Sister     SOCIAL HISTORY: Social History   Social History  . Marital status: Married    Spouse name: N/A  . Number of children: 4  . Years of education: N/A   Occupational History  . Retired    Social History Main Topics  . Smoking status: Never Smoker  . Smokeless tobacco: Never Used  . Alcohol use 0.0 oz/week     Comment: Occasional   . Drug use: No  . Sexual activity: Not Currently   Other Topics Concern  . Not on file   Social History Narrative   Social History     Marital status: Widowed          Spouse name:                        Years of education:                 Number of children: 4           Occupational History   Occupation: Copywriter, advertising                Retired : Yes  Social History Main Topics     Smoking  status: Never Smoker                                                                  Smokeless tobacco: Never Used                         Alcohol use:         0.0 oz/week        Comment:      Drug use: No               Sexual activity:        Other Topics            Concern     None on file      Social History Narrative     Do you drink/eat things with caffeine?    Yes     Marital status: Widowed.  What year were you married? 1948     Do you have a living will? Yes     Do you have DNR form? Yes     Do you have signed POA/HPOA forms? Yes                REVIEW OF SYSTEMS: Constitutional: No fevers, chills, or sweats, no generalized fatigue, change in appetite Eyes: No visual changes, double vision, eye pain Ear, nose and throat: No hearing loss, ear pain, nasal congestion, sore throat Cardiovascular: No chest pain, palpitations Respiratory:  No shortness of breath at rest or with exertion, wheezes GastrointestinaI: No nausea, vomiting, diarrhea, abdominal pain, fecal incontinence Genitourinary:  No dysuria, urinary retention or frequency Musculoskeletal:  No neck pain, back pain Integumentary: No rash, pruritus, skin lesions Neurological: as above Psychiatric: No depression, insomnia, anxiety Endocrine: No palpitations, fatigue, diaphoresis, mood swings, change in appetite, change in weight, increased thirst Hematologic/Lymphatic:  No purpura, petechiae. Allergic/Immunologic: no itchy/runny eyes, nasal congestion, recent allergic reactions, rashes  PHYSICAL EXAM: Vitals:   02/22/17 1502  BP: 132/62  Pulse: 93   General: No acute distress.  Patient appears well-groomed.  Head:  Normocephalic/atraumatic Eyes:  fundi examined but not visualized Neck: supple, no paraspinal tenderness, full range of motion Back: No paraspinal tenderness Heart: regular rate and rhythm Lungs: Clear to auscultation bilaterally. Vascular: No carotid bruits. Neurological Exam: Mental  status: alert and oriented to person and place, but not time, recent memory poor, remote memory intact, fund of knowledge fair, attention and concentration intact, speech fluent and not dysarthric, language intact. MMSE - Mini Mental State Exam 02/22/2017  Orientation to time 1  Orientation to Place 3  Registration 3  Attention/ Calculation 3  Recall 0  Language- name 2 objects 2  Language- repeat 1  Language- follow 3 step command 3  Language- read & follow direction 1  Write a sentence 1  Copy design 1  Total score 19   Cranial nerves: CN I: not tested CN II: pupils equal, round and reactive to light, visual fields intact CN III, IV, VI:  full range of motion, no nystagmus, no ptosis CN V: facial sensation intact CN VII: upper and lower face symmetric CN VIII: hearing intact CN IX, X: gag intact, uvula midline  CN XI: sternocleidomastoid and trapezius muscles intact CN XII: tongue midline Bulk & Tone: normal, no fasciculations. Motor:  5/5 throughout  Sensation: temperature and vibration sensation intact. Deep Tendon Reflexes:  2+ throughout, toes downgoing.  Finger to nose testing:  Without dysmetria.  Heel to shin:  Without dysmetria.  Gait:  Ambulates with a cane.  IMPRESSION: Probable late-onset Alzheimer's dementia  PLAN: 1.  We discussed medication options that may slow progression (namely Aricept), which only has modest effect.  She would prefer not to start any new medication, which I think is okay.  If we were to start Aricept, I would need to clear it with her cardiologist. 2.  She should remain socially active. 3.  I think she could continue driving with current restrictions (daylight hours, to same 3 locations within a quarter of a mile away).  Her daughter should continue monitoring her driving once a month.  If there are any concerns, driving should be discontinued. 4.  Will check B12 5.  Follow up in 9 months or as needed.  Thank you for allowing me to take  part in the care of this patient.  Metta Clines, DO  CC:  Jeanmarie Hubert, MD

## 2017-02-22 NOTE — Patient Instructions (Signed)
I think you probably have Alzheimer's but it is not severe. 1.  I think it is okay to continue driving with the following restrictions:  Daylight hours only  Only to the grocery store, son's house and for breakfast (all within a quarter of a mile from home)  I want you daughter to sit in the car once a month to assess your driving.  Any changes that are concerning, you should stop driving. 2.  We will check a B12 level 3.  Remain socially active. Don't just sit alone in your room all day 4.  Follow up in 9 months or as needed.

## 2017-02-23 ENCOUNTER — Telehealth: Payer: Self-pay

## 2017-02-23 DIAGNOSIS — R739 Hyperglycemia, unspecified: Secondary | ICD-10-CM | POA: Diagnosis not present

## 2017-02-23 LAB — HEMOGLOBIN A1C: HEMOGLOBIN A1C: 5.4

## 2017-02-23 LAB — BASIC METABOLIC PANEL
BUN: 15 mg/dL (ref 4–21)
Creatinine: 0.7 mg/dL (ref <=1.1)
GLUCOSE: 94 mg/dL
Sodium: 139 mmol/L (ref 137–147)

## 2017-02-23 LAB — VITAMIN B12: Vitamin B-12: 566 pg/mL (ref 200–1100)

## 2017-02-23 NOTE — Telephone Encounter (Signed)
-----   Message from Pieter Partridge, DO sent at 02/23/2017  7:45 AM EDT ----- b12 is normal

## 2017-02-23 NOTE — Telephone Encounter (Signed)
Patient daughter [name] informed of normal b12 level.

## 2017-03-04 ENCOUNTER — Other Ambulatory Visit: Payer: Self-pay | Admitting: *Deleted

## 2017-03-05 ENCOUNTER — Other Ambulatory Visit: Payer: Medicare Other

## 2017-03-05 ENCOUNTER — Ambulatory Visit: Payer: Medicare Other

## 2017-03-12 ENCOUNTER — Ambulatory Visit (HOSPITAL_BASED_OUTPATIENT_CLINIC_OR_DEPARTMENT_OTHER): Payer: Medicare Other

## 2017-03-12 ENCOUNTER — Other Ambulatory Visit (HOSPITAL_BASED_OUTPATIENT_CLINIC_OR_DEPARTMENT_OTHER): Payer: Medicare Other

## 2017-03-12 DIAGNOSIS — Z5111 Encounter for antineoplastic chemotherapy: Secondary | ICD-10-CM

## 2017-03-12 DIAGNOSIS — Z170 Estrogen receptor positive status [ER+]: Principal | ICD-10-CM

## 2017-03-12 DIAGNOSIS — C50412 Malignant neoplasm of upper-outer quadrant of left female breast: Secondary | ICD-10-CM

## 2017-03-12 DIAGNOSIS — C50912 Malignant neoplasm of unspecified site of left female breast: Secondary | ICD-10-CM

## 2017-03-12 DIAGNOSIS — C7951 Secondary malignant neoplasm of bone: Secondary | ICD-10-CM

## 2017-03-12 DIAGNOSIS — C50811 Malignant neoplasm of overlapping sites of right female breast: Secondary | ICD-10-CM

## 2017-03-12 DIAGNOSIS — C773 Secondary and unspecified malignant neoplasm of axilla and upper limb lymph nodes: Secondary | ICD-10-CM | POA: Diagnosis not present

## 2017-03-12 LAB — COMPREHENSIVE METABOLIC PANEL
ALK PHOS: 75 U/L (ref 40–150)
ALT: 15 U/L (ref 0–55)
ANION GAP: 9 meq/L (ref 3–11)
AST: 23 U/L (ref 5–34)
Albumin: 3.5 g/dL (ref 3.5–5.0)
BUN: 15.9 mg/dL (ref 7.0–26.0)
CO2: 28 mEq/L (ref 22–29)
CREATININE: 0.9 mg/dL (ref 0.6–1.1)
Calcium: 8.9 mg/dL (ref 8.4–10.4)
Chloride: 103 mEq/L (ref 98–109)
EGFR: 57 mL/min/{1.73_m2} — AB (ref >90)
GLUCOSE: 88 mg/dL (ref 70–140)
Potassium: 3.8 mEq/L (ref 3.5–5.1)
SODIUM: 140 meq/L (ref 136–145)
TOTAL PROTEIN: 6.3 g/dL — AB (ref 6.4–8.3)
Total Bilirubin: 0.83 mg/dL (ref 0.20–1.20)

## 2017-03-12 LAB — CBC WITH DIFFERENTIAL/PLATELET
BASO%: 0.9 % (ref 0.0–2.0)
Basophils Absolute: 0.1 10*3/uL (ref 0.0–0.1)
EOS ABS: 0.1 10*3/uL (ref 0.0–0.5)
EOS%: 2 % (ref 0.0–7.0)
HCT: 38.8 % (ref 34.8–46.6)
HGB: 13.1 g/dL (ref 11.6–15.9)
LYMPH%: 11.6 % — ABNORMAL LOW (ref 14.0–49.7)
MCH: 30.4 pg (ref 25.1–34.0)
MCHC: 33.7 g/dL (ref 31.5–36.0)
MCV: 90.1 fL (ref 79.5–101.0)
MONO#: 0.8 10*3/uL (ref 0.1–0.9)
MONO%: 12.3 % (ref 0.0–14.0)
NEUT%: 73.2 % (ref 38.4–76.8)
NEUTROS ABS: 4.7 10*3/uL (ref 1.5–6.5)
PLATELETS: 258 10*3/uL (ref 145–400)
RBC: 4.31 10*6/uL (ref 3.70–5.45)
RDW: 14.4 % (ref 11.2–14.5)
WBC: 6.4 10*3/uL (ref 3.9–10.3)
lymph#: 0.7 10*3/uL — ABNORMAL LOW (ref 0.9–3.3)

## 2017-03-12 MED ORDER — FULVESTRANT 250 MG/5ML IM SOLN
500.0000 mg | Freq: Once | INTRAMUSCULAR | Status: AC
  Administered 2017-03-12: 500 mg via INTRAMUSCULAR
  Filled 2017-03-12: qty 10

## 2017-03-12 NOTE — Patient Instructions (Signed)

## 2017-03-18 DIAGNOSIS — S83242A Other tear of medial meniscus, current injury, left knee, initial encounter: Secondary | ICD-10-CM | POA: Diagnosis not present

## 2017-03-18 DIAGNOSIS — M25562 Pain in left knee: Secondary | ICD-10-CM | POA: Diagnosis not present

## 2017-04-02 ENCOUNTER — Ambulatory Visit: Payer: Medicare Other

## 2017-04-02 ENCOUNTER — Other Ambulatory Visit: Payer: Medicare Other

## 2017-04-09 ENCOUNTER — Ambulatory Visit (HOSPITAL_BASED_OUTPATIENT_CLINIC_OR_DEPARTMENT_OTHER): Payer: Medicare Other

## 2017-04-09 ENCOUNTER — Other Ambulatory Visit (HOSPITAL_BASED_OUTPATIENT_CLINIC_OR_DEPARTMENT_OTHER): Payer: Medicare Other

## 2017-04-09 VITALS — BP 128/52 | HR 86 | Temp 97.2°F | Resp 18

## 2017-04-09 DIAGNOSIS — C773 Secondary and unspecified malignant neoplasm of axilla and upper limb lymph nodes: Secondary | ICD-10-CM | POA: Diagnosis not present

## 2017-04-09 DIAGNOSIS — C50912 Malignant neoplasm of unspecified site of left female breast: Secondary | ICD-10-CM

## 2017-04-09 DIAGNOSIS — Z170 Estrogen receptor positive status [ER+]: Principal | ICD-10-CM

## 2017-04-09 DIAGNOSIS — Z5111 Encounter for antineoplastic chemotherapy: Secondary | ICD-10-CM | POA: Diagnosis present

## 2017-04-09 DIAGNOSIS — C50811 Malignant neoplasm of overlapping sites of right female breast: Secondary | ICD-10-CM

## 2017-04-09 DIAGNOSIS — C7951 Secondary malignant neoplasm of bone: Secondary | ICD-10-CM

## 2017-04-09 DIAGNOSIS — C50412 Malignant neoplasm of upper-outer quadrant of left female breast: Secondary | ICD-10-CM

## 2017-04-09 LAB — CBC WITH DIFFERENTIAL/PLATELET
BASO%: 0.3 % (ref 0.0–2.0)
Basophils Absolute: 0 10*3/uL (ref 0.0–0.1)
EOS%: 1.7 % (ref 0.0–7.0)
Eosinophils Absolute: 0.1 10*3/uL (ref 0.0–0.5)
HCT: 38.9 % (ref 34.8–46.6)
HGB: 12.9 g/dL (ref 11.6–15.9)
LYMPH%: 9.4 % — AB (ref 14.0–49.7)
MCH: 29.9 pg (ref 25.1–34.0)
MCHC: 33.2 g/dL (ref 31.5–36.0)
MCV: 90 fL (ref 79.5–101.0)
MONO#: 0.9 10*3/uL (ref 0.1–0.9)
MONO%: 13.4 % (ref 0.0–14.0)
NEUT#: 4.9 10*3/uL (ref 1.5–6.5)
NEUT%: 75.2 % (ref 38.4–76.8)
PLATELETS: 190 10*3/uL (ref 145–400)
RBC: 4.32 10*6/uL (ref 3.70–5.45)
RDW: 14.1 % (ref 11.2–14.5)
WBC: 6.6 10*3/uL (ref 3.9–10.3)
lymph#: 0.6 10*3/uL — ABNORMAL LOW (ref 0.9–3.3)

## 2017-04-09 LAB — COMPREHENSIVE METABOLIC PANEL
ALT: 22 U/L (ref 0–55)
ANION GAP: 10 meq/L (ref 3–11)
AST: 24 U/L (ref 5–34)
Albumin: 3.3 g/dL — ABNORMAL LOW (ref 3.5–5.0)
Alkaline Phosphatase: 80 U/L (ref 40–150)
BUN: 11.5 mg/dL (ref 7.0–26.0)
CHLORIDE: 99 meq/L (ref 98–109)
CO2: 27 meq/L (ref 22–29)
Calcium: 9.7 mg/dL (ref 8.4–10.4)
Creatinine: 0.9 mg/dL (ref 0.6–1.1)
EGFR: 56 mL/min/{1.73_m2} — AB (ref >90)
Glucose: 112 mg/dl (ref 70–140)
POTASSIUM: 3.8 meq/L (ref 3.5–5.1)
Sodium: 135 mEq/L — ABNORMAL LOW (ref 136–145)
Total Bilirubin: 0.98 mg/dL (ref 0.20–1.20)
Total Protein: 6.1 g/dL — ABNORMAL LOW (ref 6.4–8.3)

## 2017-04-09 MED ORDER — FULVESTRANT 250 MG/5ML IM SOLN
500.0000 mg | Freq: Once | INTRAMUSCULAR | Status: AC
  Administered 2017-04-09: 500 mg via INTRAMUSCULAR
  Filled 2017-04-09: qty 10

## 2017-05-12 ENCOUNTER — Ambulatory Visit (HOSPITAL_BASED_OUTPATIENT_CLINIC_OR_DEPARTMENT_OTHER): Payer: Medicare Other

## 2017-05-12 ENCOUNTER — Other Ambulatory Visit: Payer: Self-pay | Admitting: Oncology

## 2017-05-12 ENCOUNTER — Other Ambulatory Visit (HOSPITAL_BASED_OUTPATIENT_CLINIC_OR_DEPARTMENT_OTHER): Payer: Medicare Other

## 2017-05-12 ENCOUNTER — Ambulatory Visit: Payer: Medicare Other

## 2017-05-12 ENCOUNTER — Ambulatory Visit (HOSPITAL_BASED_OUTPATIENT_CLINIC_OR_DEPARTMENT_OTHER): Payer: Medicare Other | Admitting: Oncology

## 2017-05-12 VITALS — BP 134/55 | HR 75 | Temp 98.7°F | Resp 18 | Ht 60.0 in | Wt 119.8 lb

## 2017-05-12 DIAGNOSIS — C50912 Malignant neoplasm of unspecified site of left female breast: Secondary | ICD-10-CM

## 2017-05-12 DIAGNOSIS — C50811 Malignant neoplasm of overlapping sites of right female breast: Secondary | ICD-10-CM

## 2017-05-12 DIAGNOSIS — C773 Secondary and unspecified malignant neoplasm of axilla and upper limb lymph nodes: Secondary | ICD-10-CM

## 2017-05-12 DIAGNOSIS — Z5111 Encounter for antineoplastic chemotherapy: Secondary | ICD-10-CM

## 2017-05-12 DIAGNOSIS — Z170 Estrogen receptor positive status [ER+]: Principal | ICD-10-CM

## 2017-05-12 DIAGNOSIS — C50412 Malignant neoplasm of upper-outer quadrant of left female breast: Secondary | ICD-10-CM

## 2017-05-12 DIAGNOSIS — C7951 Secondary malignant neoplasm of bone: Secondary | ICD-10-CM

## 2017-05-12 LAB — CBC WITH DIFFERENTIAL/PLATELET
BASO%: 0.1 % (ref 0.0–2.0)
BASOS ABS: 0 10*3/uL (ref 0.0–0.1)
EOS%: 1.6 % (ref 0.0–7.0)
Eosinophils Absolute: 0.1 10*3/uL (ref 0.0–0.5)
HEMATOCRIT: 39.4 % (ref 34.8–46.6)
HGB: 12.7 g/dL (ref 11.6–15.9)
LYMPH#: 0.8 10*3/uL — AB (ref 0.9–3.3)
LYMPH%: 10.8 % — AB (ref 14.0–49.7)
MCH: 29.3 pg (ref 25.1–34.0)
MCHC: 32.2 g/dL (ref 31.5–36.0)
MCV: 91 fL (ref 79.5–101.0)
MONO#: 1.1 10*3/uL — AB (ref 0.1–0.9)
MONO%: 14 % (ref 0.0–14.0)
NEUT#: 5.6 10*3/uL (ref 1.5–6.5)
NEUT%: 73.5 % (ref 38.4–76.8)
PLATELETS: 228 10*3/uL (ref 145–400)
RBC: 4.33 10*6/uL (ref 3.70–5.45)
RDW: 14.6 % — ABNORMAL HIGH (ref 11.2–14.5)
WBC: 7.7 10*3/uL (ref 3.9–10.3)

## 2017-05-12 LAB — COMPREHENSIVE METABOLIC PANEL
ALT: 20 U/L (ref 0–55)
ANION GAP: 8 meq/L (ref 3–11)
AST: 23 U/L (ref 5–34)
Albumin: 3.3 g/dL — ABNORMAL LOW (ref 3.5–5.0)
Alkaline Phosphatase: 86 U/L (ref 40–150)
BUN: 18.3 mg/dL (ref 7.0–26.0)
CALCIUM: 9.2 mg/dL (ref 8.4–10.4)
CHLORIDE: 102 meq/L (ref 98–109)
CO2: 28 meq/L (ref 22–29)
Creatinine: 0.9 mg/dL (ref 0.6–1.1)
EGFR: 58 mL/min/{1.73_m2} — ABNORMAL LOW (ref >90)
Glucose: 90 mg/dl (ref 70–140)
POTASSIUM: 3.9 meq/L (ref 3.5–5.1)
Sodium: 138 mEq/L (ref 136–145)
Total Bilirubin: 0.89 mg/dL (ref 0.20–1.20)
Total Protein: 6.3 g/dL — ABNORMAL LOW (ref 6.4–8.3)

## 2017-05-12 MED ORDER — FULVESTRANT 250 MG/5ML IM SOLN
500.0000 mg | Freq: Once | INTRAMUSCULAR | Status: AC
  Administered 2017-05-12: 500 mg via INTRAMUSCULAR
  Filled 2017-05-12: qty 10

## 2017-05-12 NOTE — Progress Notes (Signed)
[place]  Telephone:(336) [phone] Fax:(336) [phone]   ID: Lauren Buchanan DOB: 1925-05-06  MR#: [id number]  BOF#:[id number]  Patient Care Team: Blanchie Serve, MD as PCP - General (Internal Medicine) Fanny Skates, MD as Consulting Physician (General Surgery) Lyana Asbill, [name], MD as Consulting Physician (Oncology) [name], MD as Consulting Physician (Radiation Oncology) Rockwell Germany, RN as Registered Nurse Mauro Kaufmann, RN as Registered Nurse Gentry Fitz, MD as Consulting Physician (Family Medicine) [name], [name], NP as Nurse Practitioner (Internal Medicine) PCP: Blanchie Serve, MD GYN: OTHER MD: Danton Sewer MD, Thompson Grayer MD, Unice Bailey MD, Rolm Bookbinder MD  CHIEF COMPLAINT: bilateral estrogen receptor positive breast cancer, metastatic to bone  CURRENT TREATMENT: fulvestrant, zolendronate, letrozole  NOTE: Patient requests her daughter Lauren Buchanan be contacted regarding all appointments and procedures. Lauren Buchanan can be reached at [phone]  INTERVAL HISTORY: Lauren Buchanan returns today for follow-up and treatment of her estrogen receptor positive metastatic breast cancer, accompanied by her daughter. That continues on letrozole, with good tolerance.Hot flashes and vaginal dryness are not a major issue. She never developed the arthralgias or myalgias that many patients can experience on this medication. She obtains it at a good price.  She is also on fulvestrant monthly. She tolerates that without side effects that she is aware of.  Finally she receives zolendronate every 3 months. She was scheduled to receive it today but she does not like to receive both injections at the same time so that his alendronate will be scheduled for next week.  REVIEW OF SYSTEMS: Lauren Buchanan thinks there are new masses in her breasts. There has been no pain, swelling, or erythema. She tells me she is still not exercising regularly but is planning to start. A detailed review of systems  today was otherwise stable.  BREAST CANCER HISTORY: From the original intake note:  "Lauren Buchanan"  Has been aware of a sore right nipple for the last 6-8 months. She attributed it to her washing machine, which rubs that area when she uses it. On  11/21/2014 she saw Dr. 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 She mentioned the chest wall problem and Dr. Ubaldo Glassing obtained to skin biopsies 1 from the right inferior central chest and the other one of from the right mid medial chest.  The second of these showed a nodular basal cell carcinoma. The first one however showed invasive carcinoma consistent with a breast primary. This tumor was estrogen receptor positive at 100%, progesterone receptor positive at 36%, both with strong staining intensity, and HER-2 was amplified , the signals ratio being 6.2. [name] [id number])  On 12/06/2014 the patient underwent bilateral diagnostic mammography with bilateral ultrasonography. Breast density was category B. There was bilateral nipple retraction. In the left breast upper outer quadrant there was a spiculated mass noted by mammography with a second spiculated mass in the lower inner quadrant. By ultrasound there was a 1.6 cm hypoechoic lesion at the 1:30 o'clock position in the left breast, and a second mass more medially measuring 0.9 cm. Also at the 8:00 position in the left breast 2 cm from the nipple there was a 1.3 cm mass. Ultrasound of the left axilla showed an enlarged lymph node with fatty hilum replacement, measuring 1.4 cm.  In the right breast mammography showed a cluster of indeterminate microcalcifications in the upper outer quadrant and a retroareolar mass, which bile does sound measured 2.9 cm. Ultrasound of the right axilla showed a right axilla  lymph node measuring 1.4 cm.  On 12/06/2014 the patient underwent needle core biopsy of 2 separate masses in the left breast (at 1:00 and 8:00) as well as the enlarged axillary lymph node. All  3 were positive for an invasive lobular carcinoma (E-cadherin negative). All 3 tumors were estrogen receptor positive with strong staining intensity (between 87% and 100%). The lymph node was progesterone receptor negative, the other 2 masses being progesterone receptor positive at 9% and at 76%. This cancer was reported to be HER-2 positive at conference today, though I do not have the ratios at hand.  The patient is not an MRI candidate because she has a permanent pacemaker in place. Her subsequent history is as detailed below    PAST MEDICAL HISTORY: Past Medical History:  Diagnosis Date  . Asthma   . Breast cancer ([place]) 12/08/2014   Bilateral  . Cancer of central portion of female breast ([place]) 12/04/2014  . Cancer of central portion of female breast ([place]) 12/04/2014  . Complete heart block ([place]) 1999   s/p PPM by Dr Olevia Perches, most recent generator change 2009  . COPD (chronic obstructive pulmonary disease) ([place])   . Diverticulosis   . DJD (degenerative joint disease)   . Esophageal stricture   . GERD (gastroesophageal reflux disease)   . Giant cell arteritis ([place])   . Goiter   . Hemorrhoids   . Hiatal hernia   . IBS (irritable bowel syndrome)   . Lung cancer ([place])   . Paroxysmal atrial fibrillation ([place]) 12/17/2015   asymptomatic, <1%, determined on regular pacemaker interrogation  . Pneumonia   . Renal cyst   . S/P radiation therapy 04/24/15 completed   T-11  . Wears glasses     PAST SURGICAL HISTORY: Past Surgical History:  Procedure Laterality Date  . ANAL FISSURE REPAIR    . ARTERY BIOPSY Left 01/03/2013   Procedure: BIOPSY LEFT TEMPORAL ARTERY;  Surgeon: Ascencion Dike, MD;  Location: [facility];  Service: ENT;  Laterality: Left;  . BREAST BIOPSY Right   . CHOLECYSTECTOMY    . COLONOSCOPY    . FRACTURE SURGERY     fx lt wrist  . hemorrhoidectomy    . left lower lobectomy for brochiectasis  1950  . MOUTH SURGERY    . PACEMAKER INSERTION  1999   Gen change  ([facility]) by Dr Olevia Perches 2009  . RECTAL POLYPECTOMY    . RENAL CYST EXCISION    . TONSILLECTOMY      FAMILY HISTORY Family History  Problem Relation Age of Onset  . Heart disease Mother   . Breast cancer Mother   . Stomach cancer Father   . Alcohol abuse Father   . Seizures Son   . Thyroid disease Sister    the patient's father died at the age of 27, with stomach cancer. The patient's mother died at the age of 10 from a myocardial infarction. She had been diagnosed with breast cancer in her 69s. The patient had no brothers, 2 sisters. There is no other history of breast or ovarian cancer in the family to the patient's knowledge  GYNECOLOGIC HISTORY:  No LMP recorded. Patient is postmenopausal. Menarche age 69, first live birth age 50. The patient is GX P4. She underwent menopause in her late 13s. She did not take hormone replacement.  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 She is a widow, lives by herself, with no pets. Daughter Wendy Poet  lives in [place] where she is Mudlogger of a preschool and day care. Son Vernee Baines lives in [place] where he owns a furniture to sign store. Daughter Shelly Rubenstein lives in [place]. She owns a Chiropractor. Son Randall Hiss is physically and mentally disabled and lives in a group home in [place]. The patient has 7 grandchildren and 2 great-grandchildren. She attends a local [place]: In place; the patient does not wish to be resuscitated in case of a terminal event; the patient's daughter Lauren Buchanan is her healthcare power of attorney. She can be reached at [phone]. The patient has a DO NOT RESUSCITATE order in place at [employer] [facility].  HEALTH MAINTENANCE: Social History  Substance Use Topics  . Smoking status: Never Smoker  . Smokeless tobacco: Never Used  . Alcohol use 0.0 oz/week     Comment: Occasional      Colonoscopy:  PAP:  Bone density:02/19/2015, osteoporosis  Lipid  panel:  Allergies  Allergen Reactions  . Morphine And Related Nausea Only  . Amoxicillin-Pot Clavulanate Other (See Comments)    Unknown     Current Outpatient Prescriptions  Medication Sig Dispense Refill  . Calcium Citrate-Vitamin D (CITRACAL PETITES/VITAMIN D) 200-250 MG-UNIT TABS Take 1 tablet by mouth daily.      . Diclofenac Sodium (PENNSAID) 2 % SOLN Place onto the skin. Apply topical twice a day as needed    . diclofenac sodium (VOLTAREN) 1 % GEL Apply topically. Three times a day as needed    . HYDROcodone-acetaminophen (NORCO/VICODIN) 5-325 MG tablet Take 1 tablet by mouth. Take one every 4 hours as needed for pain    . letrozole (FEMARA) 2.5 MG tablet Take 1 tablet (2.5 mg total) by mouth daily. 90 tablet 4  . metoprolol succinate (TOPROL-XL) 100 MG 24 hr tablet TAKE 1/2 (ONE-HALF) TABLET BY MOUTH EVERY MORNING. Take with or immediately following a meal. 45 tablet 3  . predniSONE (DELTASONE) 5 MG tablet Take 5 mg by mouth daily with breakfast. Reported on 11/19/2015    . traMADol (ULTRAM) 50 MG tablet Take 50 mg by mouth. Take one tablet every 8 hours as needed    . TURMERIC PO Take 1 capsule by mouth daily.     No current facility-administered medications for this visit.     OBJECTIVE: Elderly white woman In no acute distress  Vitals:   05/12/17 1101  BP: (!) 134/55  Pulse: 75  Resp: 18  Temp: 98.7 F (37.1 C)     Body mass index is 23.4 kg/m.    ECOG FS:2 - Symptomatic, <50% confined to bed  Sclerae unicteric, EOMs intact Oropharynx clear and moist No cervical or supraclavicular adenopathy Lungs no rales or rhonchi Heart regular rate and rhythm Abd soft, nontender, positive bowel sounds MSK no focal spinal tenderness, no upper extremity lymphedema Neuro: nonfocal, well oriented, appropriate affect Breasts: While the right nipple area continues to be abnormal, as previously noted, and there are irregularities in the upper-outer quadrant of the left  breast, none  of these findings appear new or progressive to me. There is no overlying erythema or swelling. Both axillae are benign.   LAB RESULTS:  CMP     Component Value Date/Time   NA 138 05/12/2017 1029   K 3.9 05/12/2017 1029   CL 100 (L) 11/29/2016 1749   CO2 28 05/12/2017 1029   GLUCOSE 90 05/12/2017 1029   BUN 18.3 05/12/2017 1029   CREATININE 0.9 05/12/2017  1029   CALCIUM 9.2 05/12/2017 1029   PROT 6.3 (L) 05/12/2017 1029   ALBUMIN 3.3 (L) 05/12/2017 1029   AST 23 05/12/2017 1029   ALT 20 05/12/2017 1029   ALKPHOS 86 05/12/2017 1029   BILITOT 0.89 05/12/2017 1029   GFRNONAA >60 11/29/2016 1749   GFRAA >60 11/29/2016 1749    INo results found for: SPEP, UPEP  Lab Results  Component Value Date   WBC 7.7 05/12/2017   NEUTROABS 5.6 05/12/2017   HGB 12.7 05/12/2017   HCT 39.4 05/12/2017   MCV 91.0 05/12/2017   PLT 228 05/12/2017      Chemistry      Component Value Date/Time   NA 138 05/12/2017 1029   K 3.9 05/12/2017 1029   CL 100 (L) 11/29/2016 1749   CO2 28 05/12/2017 1029   BUN 18.3 05/12/2017 1029   CREATININE 0.9 05/12/2017 1029   GLU 94 02/23/2017      Component Value Date/Time   CALCIUM 9.2 05/12/2017 1029   ALKPHOS 86 05/12/2017 1029   AST 23 05/12/2017 1029   ALT 20 05/12/2017 1029   BILITOT 0.89 05/12/2017 1029       Lab Results  Component Value Date   LABCA2 20 04/01/2016    No components found for: [id number]  No results for input(s): INR in the last 168 hours.  Urinalysis    Component Value Date/Time   COLORURINE COLORLESS (A) 11/29/2016 1707   APPEARANCEUR CLEAR 11/29/2016 1707   LABSPEC 1.002 (L) 11/29/2016 1707   PHURINE 7.0 11/29/2016 1707   GLUCOSEU NEGATIVE 11/29/2016 1707   HGBUR SMALL (A) 11/29/2016 1707   HGBUR trace-intact 01/17/2010 0932   BILIRUBINUR NEGATIVE 11/29/2016 1707   BILIRUBINUR n 09/01/2011 1157   KETONESUR NEGATIVE 11/29/2016 1707   PROTEINUR NEGATIVE 11/29/2016 1707   UROBILINOGEN 0.2 09/27/2014 1800    NITRITE NEGATIVE 11/29/2016 1707   LEUKOCYTESUR NEGATIVE 11/29/2016 1707    STUDIES: Recent PET results reviewed  ASSESSMENT: 25 y.o. [place] woman with bilateral breast cancer, as follows:  (1) status post right breastt overlapping sites skin biopsy 11/21/2014 for a clinical T4 N1, stage IIIB invasive ductal carcinoma, estrogen and progesterone receptor positive, HER-2 amplified  (2) status post 2 separate left breast upper outer quadrant biopsies and one left axillary lymph node biopsy 12/04/2014, all positive for a clinical mT1c N1, stage IIA invasive lobular carcinoma, estrogen receptor positive, progesterone receptor variable, with MIB-1 between 10 and 40%, and no HER-2 amplification  (3) neoadjuvant anastrozole started 12/14/2014. Discontinued with progression by repeat left breast US May 2017  (4) definitive surgery to consist of bilateral mastectomies without formal axillary dissection: Postponed  METASTATIC DISEASE: March 2016 (5) PET scan 12/19/2014 shows in addition to the bilateral breast and bilateral axillary masses, a lesion at the T11 vertebral body, showing an irregular lucency on prior CT exam  (a) adjuvant radiationt to T10-T12: 04/11/2015 through 04/24/2015 to a total dose of 30 gray in 10 fractions  (6) consider anti-HER-2 treatment depending on response to antiestrogen therapy alone.    (7) osteoporosis with T score of -2.6 on bone scan obtained 02/19/2015.  (a) zolendronate started 03/20/2015, repeated every 12 weeks   (8) fulvestrant started 03/04/16  (a) patient refused adding palbociclib  (b) PET scan in 10/15/2016 showed mild increase in the right breast mass and increased size and activity of 3.2 cm of solid left lung nodule  (c) letrozole added 10/16/2016  (d) PET scan obtained 02/10/2017 showing a significant decrease in  SUV in the measurable disease  (9) Doppler ultrasound both lower extremities 11/17/2016 and repeat 11/29/2016 are   negative  PLAN: I spent approximately 30 minutes with Earlie Server with most of that time spent discussing her concerns. Lauren Buchanan thinks the masses in her breast that change but actually I am not convinced of that. I think they may be stable. It is very difficult to tell by palpation, of course, so I am setting her up for bilateral breast ultrasounds which hopefully will be done next week.  She understands that I will be out of town and will not be able to call her until I get back with those results.  If everything is stable then we are going to continue what we are doing and she will see me again with her October fulvestrant dose.  Obviously if there has been a significant change I will see her within next fulvestrant dose, 4 weeks from today.  We attender up for zolendronate today thinking that that would save her visit but actually she does not like to receive the zolendronate and fulvestrant on the same day so she will receive the zolendronate on 05/21/2017.  Overall I believe she is very stable and hopefully we will be able to continue the current treatment indefinitely. If there has been evidence of disease progression we will consider adding palbociclib.  She knows to call for any problems that may develop before her next visit here.

## 2017-05-12 NOTE — Patient Instructions (Signed)

## 2017-05-18 ENCOUNTER — Other Ambulatory Visit: Payer: Medicare Other

## 2017-05-18 ENCOUNTER — Inpatient Hospital Stay: Admission: RE | Admit: 2017-05-18 | Payer: Medicare Other | Source: Ambulatory Visit

## 2017-05-21 ENCOUNTER — Ambulatory Visit: Payer: Medicare Other

## 2017-05-21 ENCOUNTER — Ambulatory Visit (HOSPITAL_BASED_OUTPATIENT_CLINIC_OR_DEPARTMENT_OTHER): Payer: Medicare Other

## 2017-05-21 VITALS — BP 135/53 | HR 84 | Temp 98.5°F | Resp 16

## 2017-05-21 DIAGNOSIS — C7951 Secondary malignant neoplasm of bone: Secondary | ICD-10-CM

## 2017-05-21 DIAGNOSIS — Z170 Estrogen receptor positive status [ER+]: Secondary | ICD-10-CM

## 2017-05-21 DIAGNOSIS — C50412 Malignant neoplasm of upper-outer quadrant of left female breast: Secondary | ICD-10-CM

## 2017-05-21 DIAGNOSIS — C50811 Malignant neoplasm of overlapping sites of right female breast: Secondary | ICD-10-CM | POA: Diagnosis not present

## 2017-05-21 MED ORDER — SODIUM CHLORIDE 0.9 % IV SOLN
3.3000 mg | Freq: Once | INTRAVENOUS | Status: AC
  Administered 2017-05-21: 3.3 mg via INTRAVENOUS
  Filled 2017-05-21: qty 4.13

## 2017-05-21 NOTE — Patient Instructions (Signed)

## 2017-05-25 ENCOUNTER — Encounter: Payer: Self-pay | Admitting: Internal Medicine

## 2017-05-25 ENCOUNTER — Non-Acute Institutional Stay: Payer: Medicare Other | Admitting: Internal Medicine

## 2017-05-25 DIAGNOSIS — C50912 Malignant neoplasm of unspecified site of left female breast: Secondary | ICD-10-CM

## 2017-05-25 DIAGNOSIS — F0280 Dementia in other diseases classified elsewhere without behavioral disturbance: Secondary | ICD-10-CM | POA: Diagnosis not present

## 2017-05-25 DIAGNOSIS — I480 Paroxysmal atrial fibrillation: Secondary | ICD-10-CM | POA: Diagnosis not present

## 2017-05-25 DIAGNOSIS — M316 Other giant cell arteritis: Secondary | ICD-10-CM

## 2017-05-25 DIAGNOSIS — C7951 Secondary malignant neoplasm of bone: Secondary | ICD-10-CM | POA: Diagnosis not present

## 2017-05-25 DIAGNOSIS — G309 Alzheimer's disease, unspecified: Secondary | ICD-10-CM | POA: Diagnosis not present

## 2017-05-25 NOTE — Progress Notes (Signed)
Location:  [facility] Room Number: 010 Place of Service:  ALF [phone]) Provider:  Blanchie Serve MD  Blanchie Serve, MD  Patient Care Team: Blanchie Serve, MD as PCP - General (Internal Medicine) Fanny Skates, MD as Consulting Physician (General Surgery) [name], [name], MD as Consulting Physician (Oncology) [name], MD as Consulting Physician (Radiation Oncology) Rockwell Germany, RN as Registered Nurse Mauro Kaufmann, RN as Registered Nurse Gentry Fitz, MD as Consulting Physician (Family Medicine) [name], [name], NP as Nurse Practitioner (Internal Medicine)  Extended Emergency Contact Information Primary Emergency Contact: Matthews,[name] Address: [address]          [place],  [address] [place] Mobile Phone: [phone] Relation: Daughter Secondary Emergency Contact: Clent Demark [place] Phone: [phone] Relation: Other  Code Status:  DNR Goals of care: Advanced Directive information Advanced Directives 05/25/2017  Does Patient Have a Medical Advance Directive? Yes  Type of Advance Directive Out of facility DNR (pink MOST or yellow form)  Does patient want to make changes to medical advance directive? No - Patient declined  Copy of [place] in Chart? -  Would patient like information on creating a medical advance directive? -  Pre-existing out of facility DNR order (yellow form or pink MOST form) -     Chief Complaint  Patient presents with  . Medical Management of Chronic Issues    Routine Visit     HPI:  Pt is a 83 y.o. female seen today for medical management of chronic diseases.  She is in her room. She denies any concern. No new concern from nursing.    Past Medical History:  Diagnosis Date  . Asthma   . Breast cancer ([facility]) 12/08/2014   Bilateral  . Cancer of central portion of female breast ([facility]) 12/04/2014  . Cancer of central portion of female  breast ([facility]) 12/04/2014  . Complete heart block ([facility]) 1999   s/p PPM by Dr Olevia Perches, most recent generator change 2009  . COPD (chronic obstructive pulmonary disease) ([facility])   . Diverticulosis   . DJD (degenerative joint disease)   . Esophageal stricture   . GERD (gastroesophageal reflux disease)   . Giant cell arteritis ([facility])   . Goiter   . Hemorrhoids   . Hiatal hernia   . IBS (irritable bowel syndrome)   . Lung cancer ([facility])   . Paroxysmal atrial fibrillation ([facility]) 12/17/2015   asymptomatic, <1%, determined on regular pacemaker interrogation  . Pneumonia   . Renal cyst   . S/P radiation therapy 04/24/15 completed   T-11  . Wears glasses    Past Surgical History:  Procedure Laterality Date  . ANAL FISSURE REPAIR    . ARTERY BIOPSY Left 01/03/2013   Procedure: BIOPSY LEFT TEMPORAL ARTERY;  Surgeon: Ascencion Dike, MD;  Location: [facility];  Service: ENT;  Laterality: Left;  . BREAST BIOPSY Right   . CHOLECYSTECTOMY    . COLONOSCOPY    . FRACTURE SURGERY     fx lt wrist  . hemorrhoidectomy    . left lower lobectomy for brochiectasis  1950  . MOUTH SURGERY    . PACEMAKER INSERTION  1999   Gen change (SJM) by Dr Olevia Perches 2009  . RECTAL POLYPECTOMY    . RENAL CYST EXCISION    . TONSILLECTOMY      Allergies  Allergen Reactions  . Morphine And Related Nausea Only  .  Amoxicillin-Pot Clavulanate Other (See Comments)    Unknown     Outpatient Encounter Prescriptions as of 05/25/2017  Medication Sig  . Calcium Citrate-Vitamin D (CITRACAL PETITES/VITAMIN D) 200-250 MG-UNIT TABS Take 1 tablet by mouth daily.    . Diclofenac Sodium (PENNSAID) 2 % SOLN Place onto the skin. Apply topical twice a day as needed  . diclofenac sodium (VOLTAREN) 1 % GEL Apply topically 3 (three) times daily as needed.  Marland Kitchen letrozole (FEMARA) 2.5 MG tablet Take 1 tablet (2.5 mg total) by mouth daily.  . meloxicam (MOBIC) 15 MG tablet Take 15 mg by mouth as needed for pain.  . metoprolol succinate  (TOPROL-XL) 100 MG 24 hr tablet TAKE 1/2 (ONE-HALF) TABLET BY MOUTH EVERY MORNING. Take with or immediately following a meal.  . predniSONE (DELTASONE) 5 MG tablet Take 5 mg by mouth daily with breakfast. Reported on 11/19/2015  . traMADol (ULTRAM) 50 MG tablet Take 50 mg by mouth every 6 (six) hours as needed.   . TURMERIC PO Take 1 capsule by mouth daily.  . [DISCONTINUED] diclofenac sodium (VOLTAREN) 1 % GEL Apply topically. Three times a day as needed  . [DISCONTINUED] HYDROcodone-acetaminophen (NORCO/VICODIN) 5-325 MG tablet Take 1 tablet by mouth. Take one every 4 hours as needed for pain   No facility-administered encounter medications on file as of 05/25/2017.     Review of Systems  Constitutional: Negative for appetite change, chills, diaphoresis and fever.  HENT: Negative for congestion, facial swelling, hearing loss, mouth sores, postnasal drip, rhinorrhea, sinus pressure and trouble swallowing.   Respiratory: Negative for cough, shortness of breath and wheezing.   Cardiovascular: Negative for chest pain and palpitations.  Gastrointestinal: Negative for abdominal pain, blood in stool, constipation, diarrhea, nausea and vomiting.       Moved her bowel this am  Genitourinary: Negative for dysuria.  Musculoskeletal: Negative for arthralgias, back pain and gait problem.       No fall reported  Skin: Negative for pallor and rash.  Neurological: Negative for dizziness, tremors, seizures, syncope and headaches.  Psychiatric/Behavioral: Negative for confusion. The patient is not nervous/anxious.     Immunization History  Administered Date(s) Administered  . Influenza Split 07/24/2011, 06/20/2012  . Influenza Whole 10/19/2001, 07/25/2007, 08/02/2008, 07/18/2009, 07/10/2010  . Influenza,inj,Quad PF,36+ Mos 07/18/2013, 08/27/2014  . Influenza-Unspecified 07/31/2015, 08/19/2016  . Pneumococcal Polysaccharide-23 10/19/1997, 01/17/2010  . Td 10/19/1996, 07/25/2007   Pertinent  Health  Maintenance Due  Topic Date Due  . PNA vac Low Risk Adult (2 of 2 - PCV13) 01/18/2011  . INFLUENZA VACCINE  06/19/2017 (Originally 05/19/2017)  . DEXA SCAN  Completed   Fall Risk  02/22/2017 05/22/2015 03/22/2015 08/02/2014  Falls in the past year? No No No No   Functional Status Survey:    Vitals:   05/25/17 1556  BP: 116/66  Pulse: 80  Resp: 20  Temp: 97.6 F (36.4 C)  TempSrc: Oral  Weight: 120 lb 8 oz (54.7 kg)  Height: 5' (1.524 m)   Body mass index is 23.53 kg/m. Physical Exam  Constitutional: She is oriented to person, place, and time. She appears well-developed and well-nourished. No distress.  HENT:  Head: Normocephalic and atraumatic.  Mouth/Throat: Oropharynx is clear and moist.  Eyes: Pupils are equal, round, and reactive to light. Conjunctivae and EOM are normal.  Neck: Normal range of motion. Neck supple. No JVD present. No thyromegaly present.  Cardiovascular: Normal rate and regular rhythm.   Murmur heard. Pulmonary/Chest: Effort normal and breath sounds  normal. No respiratory distress. She has no wheezes. She has no rales.  Abdominal: Soft. Bowel sounds are normal. There is no tenderness. There is no guarding.  Musculoskeletal: Normal range of motion. She exhibits no edema.  Lymphadenopathy:    She has no cervical adenopathy.  Neurological: She is alert and oriented to person, place, and time.  Skin: Skin is warm and dry. She is not diaphoretic.  Psychiatric: She has a normal mood and affect. Her behavior is normal.    Labs reviewed:  Recent Labs  11/29/16 1749  03/12/17 1019 04/09/17 1022 05/12/17 1029  NA 136  < > 140 135* 138  K 4.1  < > 3.8 3.8 3.9  CL 100*  --   --   --   --   CO2 30  < > 28 27 28   GLUCOSE 100*  < > 88 112 90  BUN 12  < > 15.9 11.5 18.3  CREATININE 0.61  < > 0.9 0.9 0.9  CALCIUM 9.4  < > 8.9 9.7 9.2  < > = values in this interval not displayed.  Recent Labs  03/12/17 1019 04/09/17 1022 05/12/17 1029  AST 23 24 23   ALT  15 22 20   ALKPHOS 75 80 86  BILITOT 0.83 0.98 0.89  PROT 6.3* 6.1* 6.3*  ALBUMIN 3.5 3.3* 3.3*    Recent Labs  03/12/17 1019 04/09/17 1022 05/12/17 1029  WBC 6.4 6.6 7.7  NEUTROABS 4.7 4.9 5.6  HGB 13.1 12.9 12.7  HCT 38.8 38.9 39.4  MCV 90.1 90.0 91.0  PLT 258 190 228   Lab Results  Component Value Date   TSH 0.33 (L) 12/13/2012   Lab Results  Component Value Date   HGBA1C 5.4 02/23/2017   Lab Results  Component Value Date   CHOL 194 07/25/2007   HDL 62.7 07/25/2007   LDLCALC 118 (H) 07/25/2007   TRIG 69 07/25/2007   CHOLHDL 3.1 CALC 07/25/2007    Significant Diagnostic Results in last 30 days:  No results found.  Assessment/Plan  Paroxysmal afib Controlled rate. continue metoprolol succinate 50 mg daily and monitor.   ER + metastatic Breast cancer Followed by oncology. Currently on lentrozole 2.5 mg daily, pending breast ultrasound with concern for new breast mass. Also on monthly fulvestrant.   Alzheimer's dementia aaox 3, pleasant, supportive care for now  temporal arteritis On chronic prednisone, monitor clinically   Family/ staff Communication: reviewed care plan with patient and charge nurse.    Labs/tests ordered:  None   I spent 30 minutes in total face-to-face time with the patient, more than 50% of which was spent in counseling and coordination of care, reviewing test results, reviewing medication and discussing or reviewing the diagnosis with patient.      Blanchie Serve, MD Internal Medicine [facility] [address] [place], [place] [address] Cell Phone (Monday-Friday 8 am - 5 pm): [phone] On Call: [phone] and follow prompts after 5 pm and on weekends Office Phone: [phone] Office Fax: [phone]

## 2017-05-28 ENCOUNTER — Telehealth: Payer: Self-pay | Admitting: *Deleted

## 2017-05-28 NOTE — Telephone Encounter (Signed)
Attempted call x 1-unable to LM.  Fax received for delinquent [employer] transmissions.

## 2017-06-09 ENCOUNTER — Other Ambulatory Visit (HOSPITAL_BASED_OUTPATIENT_CLINIC_OR_DEPARTMENT_OTHER): Payer: Medicare Other

## 2017-06-09 ENCOUNTER — Other Ambulatory Visit (HOSPITAL_COMMUNITY)
Admission: AD | Admit: 2017-06-09 | Discharge: 2017-06-09 | Disposition: A | Discharge status: Home / Self Care | Payer: Medicare Other | Source: Other Acute Inpatient Hospital | Attending: Oncology | Admitting: Oncology

## 2017-06-09 ENCOUNTER — Ambulatory Visit (HOSPITAL_BASED_OUTPATIENT_CLINIC_OR_DEPARTMENT_OTHER): Payer: Medicare Other

## 2017-06-09 VITALS — BP 135/43 | HR 80 | Temp 98.3°F | Resp 18

## 2017-06-09 DIAGNOSIS — C50912 Malignant neoplasm of unspecified site of left female breast: Secondary | ICD-10-CM | POA: Diagnosis not present

## 2017-06-09 DIAGNOSIS — C7951 Secondary malignant neoplasm of bone: Secondary | ICD-10-CM | POA: Insufficient documentation

## 2017-06-09 DIAGNOSIS — C773 Secondary and unspecified malignant neoplasm of axilla and upper limb lymph nodes: Secondary | ICD-10-CM | POA: Diagnosis not present

## 2017-06-09 DIAGNOSIS — C50412 Malignant neoplasm of upper-outer quadrant of left female breast: Secondary | ICD-10-CM | POA: Diagnosis not present

## 2017-06-09 DIAGNOSIS — Z5111 Encounter for antineoplastic chemotherapy: Secondary | ICD-10-CM

## 2017-06-09 DIAGNOSIS — Z170 Estrogen receptor positive status [ER+]: Principal | ICD-10-CM

## 2017-06-09 DIAGNOSIS — C50811 Malignant neoplasm of overlapping sites of right female breast: Secondary | ICD-10-CM

## 2017-06-09 LAB — COMPREHENSIVE METABOLIC PANEL
ALBUMIN: 3.4 g/dL — AB (ref 3.5–5.0)
ALK PHOS: 76 U/L (ref 38–126)
ALT: 19 U/L (ref 14–54)
ANION GAP: 8 (ref 5–15)
AST: 27 U/L (ref 15–41)
BUN: 18 mg/dL (ref 6–20)
CALCIUM: 9.3 mg/dL (ref 8.9–10.3)
CHLORIDE: 103 mmol/L (ref 101–111)
CO2: 26 mmol/L (ref 22–32)
Creatinine, Ser: 0.88 mg/dL (ref 0.44–1.00)
GFR calc non Af Amer: 57 mL/min — ABNORMAL LOW (ref >60)
GLUCOSE: 154 mg/dL — AB (ref 65–99)
POTASSIUM: 4.1 mmol/L (ref 3.5–5.1)
SODIUM: 137 mmol/L (ref 135–145)
Total Bilirubin: 0.5 mg/dL (ref 0.3–1.2)
Total Protein: 6.2 g/dL — ABNORMAL LOW (ref 6.5–8.1)

## 2017-06-09 LAB — CBC WITH DIFFERENTIAL/PLATELET
BASO%: 0.1 % (ref 0.0–2.0)
BASOS ABS: 0 10*3/uL (ref 0.0–0.1)
EOS ABS: 0 10*3/uL (ref 0.0–0.5)
EOS%: 0.5 % (ref 0.0–7.0)
HEMATOCRIT: 37.7 % (ref 34.8–46.6)
HEMOGLOBIN: 12.2 g/dL (ref 11.6–15.9)
LYMPH#: 0.7 10*3/uL — AB (ref 0.9–3.3)
LYMPH%: 8.6 % — ABNORMAL LOW (ref 14.0–49.7)
MCH: 29.7 pg (ref 25.1–34.0)
MCHC: 32.4 g/dL (ref 31.5–36.0)
MCV: 91.7 fL (ref 79.5–101.0)
MONO#: 0.4 10*3/uL (ref 0.1–0.9)
MONO%: 4.8 % (ref 0.0–14.0)
NEUT%: 86 % — ABNORMAL HIGH (ref 38.4–76.8)
NEUTROS ABS: 6.7 10*3/uL — AB (ref 1.5–6.5)
PLATELETS: 213 10*3/uL (ref 145–400)
RBC: 4.11 10*6/uL (ref 3.70–5.45)
RDW: 14.4 % (ref 11.2–14.5)
WBC: 7.8 10*3/uL (ref 3.9–10.3)

## 2017-06-09 MED ORDER — FULVESTRANT 250 MG/5ML IM SOLN
500.0000 mg | Freq: Once | INTRAMUSCULAR | Status: AC
  Administered 2017-06-09: 500 mg via INTRAMUSCULAR
  Filled 2017-06-09: qty 10

## 2017-07-01 ENCOUNTER — Non-Acute Institutional Stay: Payer: Medicare Other

## 2017-07-01 DIAGNOSIS — Z0000 Encounter for general adult medical examination without abnormal findings: Secondary | ICD-10-CM | POA: Diagnosis not present

## 2017-07-01 NOTE — Progress Notes (Signed)
Subjective:   Lauren Buchanan is a 83 y.o. female who presents for Medicare Annual (Subsequent) preventive examination at [facility]-09/01/2011    Objective:     Vitals: BP 128/66 (BP Location: Right Arm, Patient Position: Sitting)   Pulse 68   Temp (!) 97.4 F (36.3 C) (Oral)   Ht 5' (1.524 m)   Wt 121 lb (54.9 kg)   SpO2 95%   BMI 23.63 kg/m   Body mass index is 23.63 kg/m.   Tobacco History  Smoking Status  . Never Smoker  Smokeless Tobacco  . Never Used     Counseling given: Not Answered   Past Medical History:  Diagnosis Date  . Asthma   . Breast cancer ([facility]) 12/08/2014   Bilateral  . Cancer of central portion of female breast ([facility]) 12/04/2014  . Cancer of central portion of female breast ([facility]) 12/04/2014  . Complete heart block ([facility]) 1999   s/p PPM by Dr Olevia Perches, most recent generator change 2009  . COPD (chronic obstructive pulmonary disease) ([facility])   . Diverticulosis   . DJD (degenerative joint disease)   . Esophageal stricture   . GERD (gastroesophageal reflux disease)   . Giant cell arteritis ([facility])   . Goiter   . Hemorrhoids   . Hiatal hernia   . IBS (irritable bowel syndrome)   . Lung cancer ([facility])   . Paroxysmal atrial fibrillation ([facility]) 12/17/2015   asymptomatic, <1%, determined on regular pacemaker interrogation  . Pneumonia   . Renal cyst   . S/P radiation therapy 04/24/15 completed   T-11  . Wears glasses    Past Surgical History:  Procedure Laterality Date  . ANAL FISSURE REPAIR    . ARTERY BIOPSY Left 01/03/2013   Procedure: BIOPSY LEFT TEMPORAL ARTERY;  Surgeon: Ascencion Dike, MD;  Location: [facility];  Service: ENT;  Laterality: Left;  . BREAST BIOPSY Right   . CHOLECYSTECTOMY    . COLONOSCOPY    . FRACTURE SURGERY     fx lt wrist  . hemorrhoidectomy    . left lower lobectomy for brochiectasis  1950  . MOUTH SURGERY    . PACEMAKER INSERTION  1999   Gen change (SJM) by Dr Olevia Perches  2009  . RECTAL POLYPECTOMY    . RENAL CYST EXCISION    . TONSILLECTOMY     Family History  Problem Relation Age of Onset  . Heart disease Mother   . Breast cancer Mother   . Stomach cancer Father   . Alcohol abuse Father   . Seizures Son   . Thyroid disease Sister    History  Sexual Activity  . Sexual activity: Not Currently    Outpatient Encounter Prescriptions as of 07/01/2017  Medication Sig  . Calcium Citrate-Vitamin D (CITRACAL PETITES/VITAMIN D) 200-250 MG-UNIT TABS Take 1 tablet by mouth daily.    . Diclofenac Sodium (PENNSAID) 2 % SOLN Place onto the skin. Apply topical twice a day as needed  . diclofenac sodium (VOLTAREN) 1 % GEL Apply topically 3 (three) times daily as needed.  Marland Kitchen letrozole (FEMARA) 2.5 MG tablet Take 1 tablet (2.5 mg total) by mouth daily.  . meloxicam (MOBIC) 15 MG tablet Take 15 mg by mouth as needed for pain.  . metoprolol succinate (TOPROL-XL) 100 MG 24 hr tablet TAKE 1/2 (ONE-HALF) TABLET BY MOUTH EVERY MORNING. Take with or immediately following a meal.  . predniSONE (DELTASONE) 5 MG tablet Take 5  mg by mouth daily with breakfast. Reported on 11/19/2015  . traMADol (ULTRAM) 50 MG tablet Take 50 mg by mouth every 6 (six) hours as needed.   . TURMERIC PO Take 1 capsule by mouth daily.   No facility-administered encounter medications on file as of 07/01/2017.     Activities of Daily Living In your present state of health, do you have any difficulty performing the following activities: 07/01/2017  Hearing? N  Vision? N  Difficulty concentrating or making decisions? N  Walking or climbing stairs? N  Dressing or bathing? N  Doing errands, shopping? Y  Preparing Food and eating ? Y  Using the Toilet? N  In the past six months, have you accidently leaked urine? N  Do you have problems with loss of bowel control? N  Managing your Medications? Y  Managing your Finances? Y  Housekeeping or managing your Housekeeping? Y  Some recent data might be  hidden    Patient Care Team: Blanchie Serve, MD as PCP - General (Internal Medicine) Fanny Skates, MD as Consulting Physician (General Surgery) [name], [name], MD as Consulting Physician (Oncology) [name], MD as Consulting Physician (Radiation Oncology) Rockwell Germany, RN as Registered Nurse Mauro Kaufmann, RN as Registered Nurse Gentry Fitz, MD as Consulting Physician (Family Medicine) [name], [name], NP as Nurse Practitioner (Internal Medicine)    Assessment:     Exercise Activities and Dietary recommendations Current Exercise Habits: The patient does not participate in regular exercise at present, Exercise limited by: None identified  Goals    None     Fall Risk Fall Risk  07/01/2017 02/22/2017 05/22/2015 03/22/2015 08/02/2014  Falls in the past year? No No No No No   Depression Screen PHQ 2/9 Scores 07/01/2017 08/02/2014  PHQ - 2 Score 0 0     Cognitive Function MMSE - Mini Mental State Exam 02/22/2017 02/22/2017  Orientation to time 1 1  Orientation to Place 3 3  Registration 3 3  Attention/ Calculation 3 3  Recall 0 0  Language- name 2 objects 2 2  Language- repeat 1 1  Language- follow 3 step command 3 3  Language- read & follow direction 1 1  Write a sentence 1 1  Copy design 1 1  Total score 19 19        Immunization History  Administered Date(s) Administered  . Influenza Split 07/24/2011, 06/20/2012  . Influenza Whole 10/19/2001, 07/25/2007, 08/02/2008, 07/18/2009, 07/10/2010  . Influenza,inj,Quad PF,6+ Mos 07/18/2013, 08/27/2014  . Influenza-Unspecified 07/31/2015, 08/19/2016  . Pneumococcal Polysaccharide-23 10/19/1997, 01/17/2010  . Td 10/19/1996, 07/25/2007   Screening Tests Health Maintenance  Topic Date Due  . PNA vac Low Risk Adult (2 of 2 - PCV13) 01/18/2011  . INFLUENZA VACCINE  05/19/2017  . TETANUS/TDAP  07/24/2017  . DEXA SCAN  Completed      Plan:    I have personally reviewed and addressed the Medicare Annual  Wellness questionnaire and have noted the following in the patient's chart:  A. Medical and social history B. Use of alcohol, tobacco or illicit drugs  C. Current medications and supplements D. Functional ability and status E.  Nutritional status F.  Physical activity G. Advance directives H. List of other physicians I.  Hospitalizations, surgeries, and ER visits in previous 12 months J.  [place] to include hearing, vision, cognitive, depression L. Referrals and appointments - none  In addition, I have reviewed and discussed with patient certain preventive protocols, quality metrics, and  best practice recommendations. A written personalized care plan for preventive services as well as general preventive health recommendations were provided to patient.  See attached scanned questionnaire for additional information.   Signed,   Rich Reining, RN Nurse Health Advisor   Quick Notes   Health Maintenance: Prevnar due. Pt will get flu vaccine when facility gets it in stock     Abnormal Screen: MMSE 19/30 on 02/22/17     Patient Concerns: None     Nurse Concerns: None

## 2017-07-01 NOTE — Patient Instructions (Signed)
Lauren Buchanan , Thank you for taking time to come for your Medicare Wellness Visit. I appreciate your ongoing commitment to your health goals. Please review the following plan we discussed and let me know if I can assist you in the future.   Screening recommendations/referrals: Colonoscopy excluded, pt over age 81 Mammogram excluded, pt over age 26 Bone Density up to date Recommended yearly ophthalmology/optometry visit for glaucoma screening and checkup Recommended yearly dental visit for hygiene and checkup  Vaccinations: Influenza vaccine due Pneumococcal vaccine 13 due Tdap vaccine up to date. Due 07/24/2017 Shingles vaccine not in records    Advanced directives:In Chart  Conditions/risks identified: None  Next appointment: Dr. Bubba Camp makes rounds   Preventive Care 67 Years and Older, Female Preventive care refers to lifestyle choices and visits with your health care provider that can promote health and wellness. What does preventive care include?  A yearly physical exam. This is also called an annual well check.  Dental exams once or twice a year.  Routine eye exams. Ask your health care provider how often you should have your eyes checked.  Personal lifestyle choices, including:  Daily care of your teeth and gums.  Regular physical activity.  Eating a healthy diet.  Avoiding tobacco and drug use.  Limiting alcohol use.  Practicing safe sex.  Taking low-dose aspirin every day.  Taking vitamin and mineral supplements as recommended by your health care provider. What happens during an annual well check? The services and screenings done by your health care provider during your annual well check will depend on your age, overall health, lifestyle risk factors, and family history of disease. Counseling  Your health care provider may ask you questions about your:  Alcohol use.  Tobacco use.  Drug use.  Emotional well-being.  Home and relationship  well-being.  Sexual activity.  Eating habits.  History of falls.  Memory and ability to understand (cognition).  Work and work Statistician.  Reproductive health. Screening  You may have the following tests or measurements:  Height, weight, and BMI.  Blood pressure.  Lipid and cholesterol levels. These may be checked every 5 years, or more frequently if you are over 34 years old.  Skin check.  Lung cancer screening. You may have this screening every year starting at age 85 if you have a 30-pack-year history of smoking and currently smoke or have quit within the past 15 years.  Fecal occult blood test (FOBT) of the stool. You may have this test every year starting at age 30.  Flexible sigmoidoscopy or colonoscopy. You may have a sigmoidoscopy every 5 years or a colonoscopy every 10 years starting at age 66.  Hepatitis C blood test.  Hepatitis B blood test.  Sexually transmitted disease (STD) testing.  Diabetes screening. This is done by checking your blood sugar (glucose) after you have not eaten for a while (fasting). You may have this done every 1-3 years.  Bone density scan. This is done to screen for osteoporosis. You may have this done starting at age 82.  Mammogram. This may be done every 1-2 years. Talk to your health care provider about how often you should have regular mammograms. Talk with your health care provider about your test results, treatment options, and if necessary, the need for more tests. Vaccines  Your health care provider may recommend certain vaccines, such as:  Influenza vaccine. This is recommended every year.  Tetanus, diphtheria, and acellular pertussis (Tdap, Td) vaccine. You may need a Td booster  every 10 years.  Zoster vaccine. You may need this after age 22.  Pneumococcal 13-valent conjugate (PCV13) vaccine. One dose is recommended after age 14.  Pneumococcal polysaccharide (PPSV23) vaccine. One dose is recommended after age  75. Talk to your health care provider about which screenings and vaccines you need and how often you need them. This information is not intended to replace advice given to you by your health care provider. Make sure you discuss any questions you have with your health care provider. Document Released: 11/01/2015 Document Revised: 06/24/2016 Document Reviewed: 08/06/2015 Elsevier Interactive Patient Education  2017 [place] Prevention in the Home Falls can cause injuries. They can happen to people of all ages. There are many things you can do to make your home safe and to help prevent falls. What can I do on the outside of my home?  Regularly fix the edges of walkways and driveways and fix any cracks.  Remove anything that might make you trip as you walk through a door, such as a raised step or threshold.  Trim any bushes or trees on the path to your home.  Use bright outdoor lighting.  Clear any walking paths of anything that might make someone trip, such as rocks or tools.  Regularly check to see if handrails are loose or broken. Make sure that both sides of any steps have handrails.  Any raised decks and porches should have guardrails on the edges.  Have any leaves, snow, or ice cleared regularly.  Use sand or salt on walking paths during winter.  Clean up any spills in your garage right away. This includes oil or grease spills. What can I do in the bathroom?  Use night lights.  Install grab bars by the toilet and in the tub and shower. Do not use towel bars as grab bars.  Use non-skid mats or decals in the tub or shower.  If you need to sit down in the shower, use a plastic, non-slip stool.  Keep the floor dry. Clean up any water that spills on the floor as soon as it happens.  Remove soap buildup in the tub or shower regularly.  Attach bath mats securely with double-sided non-slip rug tape.  Do not have throw rugs and other things on the floor that can make  you trip. What can I do in the bedroom?  Use night lights.  Make sure that you have a light by your bed that is easy to reach.  Do not use any sheets or blankets that are too big for your bed. They should not hang down onto the floor.  Have a firm chair that has side arms. You can use this for support while you get dressed.  Do not have throw rugs and other things on the floor that can make you trip. What can I do in the kitchen?  Clean up any spills right away.  Avoid walking on wet floors.  Keep items that you use a lot in easy-to-reach places.  If you need to reach something above you, use a strong step stool that has a grab bar.  Keep electrical cords out of the way.  Do not use floor polish or wax that makes floors slippery. If you must use wax, use non-skid floor wax.  Do not have throw rugs and other things on the floor that can make you trip. What can I do with my stairs?  Do not leave any items on the stairs.  Make  sure that there are handrails on both sides of the stairs and use them. Fix handrails that are broken or loose. Make sure that handrails are as long as the stairways.  Check any carpeting to make sure that it is firmly attached to the stairs. Fix any carpet that is loose or worn.  Avoid having throw rugs at the top or bottom of the stairs. If you do have throw rugs, attach them to the floor with carpet tape.  Make sure that you have a light switch at the top of the stairs and the bottom of the stairs. If you do not have them, ask someone to add them for you. What else can I do to help prevent falls?  Wear shoes that:  Do not have high heels.  Have rubber bottoms.  Are comfortable and fit you well.  Are closed at the toe. Do not wear sandals.  If you use a stepladder:  Make sure that it is fully opened. Do not climb a closed stepladder.  Make sure that both sides of the stepladder are locked into place.  Ask someone to hold it for you, if  possible.  Clearly mark and make sure that you can see:  Any grab bars or handrails.  First and last steps.  Where the edge of each step is.  Use tools that help you move around (mobility aids) if they are needed. These include:  Canes.  Walkers.  Scooters.  Crutches.  Turn on the lights when you go into a dark area. Replace any light bulbs as soon as they burn out.  Set up your furniture so you have a clear path. Avoid moving your furniture around.  If any of your floors are uneven, fix them.  If there are any pets around you, be aware of where they are.  Review your medicines with your doctor. Some medicines can make you feel dizzy. This can increase your chance of falling. Ask your doctor what other things that you can do to help prevent falls. This information is not intended to replace advice given to you by your health care provider. Make sure you discuss any questions you have with your health care provider. Document Released: 08/01/2009 Document Revised: 03/12/2016 Document Reviewed: 11/09/2014 Elsevier Interactive Patient Education  2017 [employer].

## 2017-07-07 ENCOUNTER — Ambulatory Visit: Payer: Medicare Other

## 2017-07-07 ENCOUNTER — Other Ambulatory Visit: Payer: Medicare Other

## 2017-07-08 ENCOUNTER — Ambulatory Visit (HOSPITAL_BASED_OUTPATIENT_CLINIC_OR_DEPARTMENT_OTHER): Payer: Medicare Other

## 2017-07-08 ENCOUNTER — Other Ambulatory Visit (HOSPITAL_BASED_OUTPATIENT_CLINIC_OR_DEPARTMENT_OTHER): Payer: Medicare Other

## 2017-07-08 VITALS — BP 136/53 | HR 77 | Temp 98.6°F | Resp 16

## 2017-07-08 DIAGNOSIS — C773 Secondary and unspecified malignant neoplasm of axilla and upper limb lymph nodes: Secondary | ICD-10-CM | POA: Diagnosis not present

## 2017-07-08 DIAGNOSIS — C50912 Malignant neoplasm of unspecified site of left female breast: Secondary | ICD-10-CM

## 2017-07-08 DIAGNOSIS — C50811 Malignant neoplasm of overlapping sites of right female breast: Secondary | ICD-10-CM

## 2017-07-08 DIAGNOSIS — Z5111 Encounter for antineoplastic chemotherapy: Secondary | ICD-10-CM

## 2017-07-08 DIAGNOSIS — C50412 Malignant neoplasm of upper-outer quadrant of left female breast: Secondary | ICD-10-CM | POA: Diagnosis present

## 2017-07-08 DIAGNOSIS — Z170 Estrogen receptor positive status [ER+]: Principal | ICD-10-CM

## 2017-07-08 DIAGNOSIS — C7951 Secondary malignant neoplasm of bone: Secondary | ICD-10-CM

## 2017-07-08 LAB — COMPREHENSIVE METABOLIC PANEL
ALT: 16 U/L (ref 0–55)
AST: 23 U/L (ref 5–34)
Albumin: 3.3 g/dL — ABNORMAL LOW (ref 3.5–5.0)
Alkaline Phosphatase: 84 U/L (ref 40–150)
Anion Gap: 9 mEq/L (ref 3–11)
BUN: 12 mg/dL (ref 7.0–26.0)
CHLORIDE: 100 meq/L (ref 98–109)
CO2: 27 meq/L (ref 22–29)
Calcium: 9.3 mg/dL (ref 8.4–10.4)
Creatinine: 0.8 mg/dL (ref 0.6–1.1)
EGFR: 67 mL/min/{1.73_m2} — AB (ref >90)
GLUCOSE: 84 mg/dL (ref 70–140)
POTASSIUM: 4.1 meq/L (ref 3.5–5.1)
SODIUM: 135 meq/L — AB (ref 136–145)
Total Bilirubin: 1.01 mg/dL (ref 0.20–1.20)
Total Protein: 6.4 g/dL (ref 6.4–8.3)

## 2017-07-08 LAB — CBC WITH DIFFERENTIAL/PLATELET
BASO%: 0.4 % (ref 0.0–2.0)
BASOS ABS: 0 10*3/uL (ref 0.0–0.1)
EOS ABS: 0.1 10*3/uL (ref 0.0–0.5)
EOS%: 1.6 % (ref 0.0–7.0)
HCT: 37.5 % (ref 34.8–46.6)
HGB: 12.5 g/dL (ref 11.6–15.9)
LYMPH%: 8.5 % — AB (ref 14.0–49.7)
MCH: 30 pg (ref 25.1–34.0)
MCHC: 33.3 g/dL (ref 31.5–36.0)
MCV: 90.1 fL (ref 79.5–101.0)
MONO#: 1.1 10*3/uL — ABNORMAL HIGH (ref 0.1–0.9)
MONO%: 13.1 % (ref 0.0–14.0)
NEUT%: 76.4 % (ref 38.4–76.8)
NEUTROS ABS: 6.6 10*3/uL — AB (ref 1.5–6.5)
Platelets: 250 10*3/uL (ref 145–400)
RBC: 4.16 10*6/uL (ref 3.70–5.45)
RDW: 14.6 % — ABNORMAL HIGH (ref 11.2–14.5)
WBC: 8.6 10*3/uL (ref 3.9–10.3)
lymph#: 0.7 10*3/uL — ABNORMAL LOW (ref 0.9–3.3)

## 2017-07-08 MED ORDER — FULVESTRANT 250 MG/5ML IM SOLN
500.0000 mg | Freq: Once | INTRAMUSCULAR | Status: AC
  Administered 2017-07-08: 500 mg via INTRAMUSCULAR
  Filled 2017-07-08: qty 10

## 2017-07-19 ENCOUNTER — Telehealth: Payer: Self-pay | Admitting: *Deleted

## 2017-07-19 NOTE — Telephone Encounter (Signed)
Notified patient/daughter of oncology lab results. Dinah suggested she start a protein supplement based on albumin level. [facility] will need a written order as to what she will need since she is there in [place]. Could you please take care of this since Herman isn't here and we're not there on a routine basis? Thanks!

## 2017-07-19 NOTE — Telephone Encounter (Signed)
Thanks! I had suggested boost, but didn't realize she was in [place].

## 2017-07-19 NOTE — Telephone Encounter (Signed)
  I would like to review her labs and Dinah's recommendation at [facility] when I am there and will make necessary changes. Thanks for the info.

## 2017-07-21 NOTE — Telephone Encounter (Signed)
Daughter, [name] called and stated that [facility] has never received an order for Protein shakes for patient. Daughter would like the order given to [facility] ASAP due to patient's nurse being off on Friday for vacation.

## 2017-07-21 NOTE — Telephone Encounter (Signed)
Patient will be seen by dietary team at the facility and placed on nutritional supplement. Thank you.

## 2017-08-01 NOTE — Progress Notes (Signed)
[place]  Telephone:([phone] Fax:(336) [phone]   ID: Lauren Buchanan DOB: 08/17/1925  MR#: [id number]  EXB#:[id number]  Patient Care Team: Blanchie Serve, MD as PCP - General (Internal Medicine) Fanny Skates, MD as Consulting Physician (General Surgery) Lula Kolton, [name], MD as Consulting Physician (Oncology) [name], MD as Consulting Physician (Radiation Oncology) Rockwell Germany, RN as Registered Nurse Mauro Kaufmann, RN as Registered Nurse Gentry Fitz, MD as Consulting Physician (Family Medicine) [name], [name], NP as Nurse Practitioner (Internal Medicine) PCP: Blanchie Serve, MD GYN: OTHER MD: Danton Sewer MD, Thompson Grayer MD, Unice Bailey MD, Rolm Bookbinder MD  CHIEF COMPLAINT: bilateral estrogen receptor positive breast cancer, metastatic to bone  CURRENT TREATMENT: fulvestrant, zolendronate, letrozole  NOTE: Patient requests her daughter [name] be contacted regarding all appointments and procedures. [name] can be reached at [phone]  INTERVAL HISTORY: Lauren Buchanan returns today for follow-up and treatment of her estrogen receptor positive breast cancer. She is accompanied by her daughter. She continues on letrozole, which she tolerates well overall. She is not having hot flashes or increase in vaginal dryness.   Lauren Buchanan also receives fulvestrant every 4 weeks, with a dose due today. She reports that with the last administration she had increased soreness to the injection site.    She receives zolendronate every 3 months with her next infusion being in November.  REVIEW OF SYSTEMS: Lauren Buchanan reports right breast pain. She continues to forget that she has cancer in the periareolar area of the right breast and wonders why it is different from the left. She denies unusual headaches, visual changes, nausea, vomiting, or dizziness. There has been no unusual cough, phlegm production, or pleurisy. This been no change in bowel or bladder habits. She denies  unexplained fatigue or unexplained weight loss, bleeding, rash, or fever. A detailed review of systems was otherwise negative.     BREAST CANCER HISTORY: From the original intake note:  "Lauren Buchanan"  Has been aware of a sore right nipple for the last 6-8 months. She attributed it to her washing machine, which rubs that area when she uses it. On  11/21/2014 she saw Dr. 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 She mentioned the chest wall problem and Dr. Ubaldo Glassing obtained to skin biopsies 1 from the right inferior central chest and the other one of from the right mid medial chest.  The second of these showed a nodular basal cell carcinoma. The first one however showed invasive carcinoma consistent with a breast primary. This tumor was estrogen receptor positive at 100%, progesterone receptor positive at 36%, both with strong staining intensity, and HER-2 was amplified , the signals ratio being 6.2. [name] [id number])  On 12/06/2014 the patient underwent bilateral diagnostic mammography with bilateral ultrasonography. Breast density was category B. There was bilateral nipple retraction. In the left breast upper outer quadrant there was a spiculated mass noted by mammography with a second spiculated mass in the lower inner quadrant. By ultrasound there was a 1.6 cm hypoechoic lesion at the 1:30 o'clock position in the left breast, and a second mass more medially measuring 0.9 cm. Also at the 8:00 position in the left breast 2 cm from the nipple there was a 1.3 cm mass. Ultrasound of the left axilla showed an enlarged lymph node with fatty hilum replacement, measuring 1.4 cm.  In the right breast mammography showed a cluster of indeterminate microcalcifications in the upper outer quadrant and a retroareolar mass, which bile  does sound measured 2.9 cm. Ultrasound of the right axilla showed a right axilla lymph node measuring 1.4 cm.  On 12/06/2014 the patient underwent needle core biopsy of 2  separate masses in the left breast (at 1:00 and 8:00) as well as the enlarged axillary lymph node. All 3 were positive for an invasive lobular carcinoma (E-cadherin negative). All 3 tumors were estrogen receptor positive with strong staining intensity (between 87% and 100%). The lymph node was progesterone receptor negative, the other 2 masses being progesterone receptor positive at 9% and at 76%. This cancer was reported to be HER-2 positive at conference today, though I do not have the ratios at hand.  The patient is not an MRI candidate because she has a permanent pacemaker in place. Her subsequent history is as detailed below    PAST MEDICAL HISTORY: Past Medical History:  Diagnosis Date  . Asthma   . Breast cancer ([place]) 12/08/2014   Bilateral  . Cancer of central portion of female breast ([place]) 12/04/2014  . Cancer of central portion of female breast ([place]) 12/04/2014  . Complete heart block ([place]) 1999   s/p PPM by Dr Olevia Perches, most recent generator change 2009  . COPD (chronic obstructive pulmonary disease) ([place])   . Diverticulosis   . DJD (degenerative joint disease)   . Esophageal stricture   . GERD (gastroesophageal reflux disease)   . Giant cell arteritis ([place])   . Goiter   . Hemorrhoids   . Hiatal hernia   . IBS (irritable bowel syndrome)   . Lung cancer ([place])   . Paroxysmal atrial fibrillation ([place]) 12/17/2015   asymptomatic, <1%, determined on regular pacemaker interrogation  . Pneumonia   . Renal cyst   . S/P radiation therapy 04/24/15 completed   T-11  . Wears glasses     PAST SURGICAL HISTORY: Past Surgical History:  Procedure Laterality Date  . ANAL FISSURE REPAIR    . ARTERY BIOPSY Left 01/03/2013   Procedure: BIOPSY LEFT TEMPORAL ARTERY;  Surgeon: Ascencion Dike, MD;  Location: [facility];  Service: ENT;  Laterality: Left;  . BREAST BIOPSY Right   . CHOLECYSTECTOMY    . COLONOSCOPY    . FRACTURE SURGERY     fx lt wrist  . hemorrhoidectomy    . left  lower lobectomy for brochiectasis  1950  . MOUTH SURGERY    . PACEMAKER INSERTION  1999   Gen change (SJM) by Dr Olevia Perches 2009  . RECTAL POLYPECTOMY    . RENAL CYST EXCISION    . TONSILLECTOMY      FAMILY HISTORY Family History  Problem Relation Age of Onset  . Heart disease Mother   . Breast cancer Mother   . Stomach cancer Father   . Alcohol abuse Father   . Seizures Son   . Thyroid disease Sister    the patient's father died at the age of 3, with stomach cancer. The patient's mother died at the age of 38 from a myocardial infarction. She had been diagnosed with breast cancer in her 64s. The patient had no brothers, 2 sisters. There is no other history of breast or ovarian cancer in the family to the patient's knowledge  GYNECOLOGIC HISTORY:  No LMP recorded. Patient is postmenopausal. Menarche age 78, first live birth age 67. The patient is GX P4. She underwent menopause in her late 57s. She did not take hormone replacement.  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 She is a widow, lives by herself, with no pets. Daughter Wendy Poet lives in [place] where she is Mudlogger of a preschool and day care. Son Maghan Jessee lives in [place] where he owns a furniture to sign store. Daughter Shelly Rubenstein lives in [place]. She owns a Chiropractor. Son Randall Hiss is physically and mentally disabled and lives in a group home in [place]. The patient has 7 grandchildren and 2 great-grandchildren. She attends a local [employer]: In place; the patient does not wish to be resuscitated in case of a terminal event; the patient's daughter [name] is her healthcare power of attorney. She can be reached at [phone]. The patient has a DO NOT RESUSCITATE order in place at [facility].  HEALTH MAINTENANCE: Social History  Substance Use Topics  . Smoking status: Never Smoker  . Smokeless tobacco: Never Used  . Alcohol use 0.0 oz/week      Comment: Occasional      Colonoscopy:  PAP:  Bone density:02/19/2015, osteoporosis  Lipid panel:  Allergies  Allergen Reactions  . Morphine And Related Nausea Only  . Amoxicillin-Pot Clavulanate Other (See Comments)    Unknown     Current Outpatient Prescriptions  Medication Sig Dispense Refill  . Calcium Citrate-Vitamin D (CITRACAL PETITES/VITAMIN D) 200-250 MG-UNIT TABS Take 1 tablet by mouth daily.      . Diclofenac Sodium (PENNSAID) 2 % SOLN Place onto the skin. Apply topical twice a day as needed    . diclofenac sodium (VOLTAREN) 1 % GEL Apply topically 3 (three) times daily as needed.    Marland Kitchen letrozole (FEMARA) 2.5 MG tablet Take 1 tablet (2.5 mg total) by mouth daily. 90 tablet 4  . meloxicam (MOBIC) 15 MG tablet Take 15 mg by mouth as needed for pain.    . metoprolol succinate (TOPROL-XL) 100 MG 24 hr tablet TAKE 1/2 (ONE-HALF) TABLET BY MOUTH EVERY MORNING. Take with or immediately following a meal. 45 tablet 3  . predniSONE (DELTASONE) 5 MG tablet Take 5 mg by mouth daily with breakfast. Reported on 11/19/2015    . traMADol (ULTRAM) 50 MG tablet Take 50 mg by mouth every 6 (six) hours as needed.     . TURMERIC PO Take 1 capsule by mouth daily.     No current facility-administered medications for this visit.     OBJECTIVE: Elderly white woman Who appears stated age  63:   08/04/17 1519  BP: 136/67  Pulse: 77  Resp: 20  Temp: 97.7 F (36.5 C)  SpO2: 100%     Body mass index is 24.12 kg/m.    ECOG FS:1 - Symptomatic but completely ambulatory  Sclerae unicteric, pupils round and equal Oropharynx clear and moist No cervical or supraclavicular adenopathy Lungs no rales or rhonchi Heart regular rate and rhythm Abd soft, nontender, positive bowel sounds MSK no focal spinal tenderness, no upper extremity lymphedema Neuro: nonfocal, well oriented, appropriate affect Breasts: The right breast nipple area is imaged below. It is unchanged from baseline. The left breast  is benign. Both axillae are benign.  Right breast 08/04/2017    LAB RESULTS:  CMP     Component Value Date/Time   NA 135 (L) 07/08/2017 1408   K 4.1 07/08/2017 1408   CL 103 06/09/2017 1447   CO2 27 07/08/2017 1408   GLUCOSE 84 07/08/2017 1408   BUN 12.0 07/08/2017 1408   CREATININE 0.8 07/08/2017 1408   CALCIUM 9.3 07/08/2017 1408  PROT 6.4 07/08/2017 1408   ALBUMIN 3.3 (L) 07/08/2017 1408   AST 23 07/08/2017 1408   ALT 16 07/08/2017 1408   ALKPHOS 84 07/08/2017 1408   BILITOT 1.01 07/08/2017 1408   GFRNONAA 57 (L) 06/09/2017 1447   GFRAA >60 06/09/2017 1447    INo results found for: SPEP, UPEP  Lab Results  Component Value Date   WBC 8.0 08/04/2017   NEUTROABS 6.6 (H) 08/04/2017   HGB 12.6 08/04/2017   HCT 39.0 08/04/2017   MCV 90.7 08/04/2017   PLT 252 08/04/2017      Chemistry      Component Value Date/Time   NA 135 (L) 07/08/2017 1408   K 4.1 07/08/2017 1408   CL 103 06/09/2017 1447   CO2 27 07/08/2017 1408   BUN 12.0 07/08/2017 1408   CREATININE 0.8 07/08/2017 1408   GLU 94 02/23/2017      Component Value Date/Time   CALCIUM 9.3 07/08/2017 1408   ALKPHOS 84 07/08/2017 1408   AST 23 07/08/2017 1408   ALT 16 07/08/2017 1408   BILITOT 1.01 07/08/2017 1408       Lab Results  Component Value Date   LABCA2 20 04/01/2016    No components found for: [id number]  No results for input(s): INR in the last 168 hours.  Urinalysis    Component Value Date/Time   COLORURINE COLORLESS (A) 11/29/2016 1707   APPEARANCEUR CLEAR 11/29/2016 1707   LABSPEC 1.002 (L) 11/29/2016 1707   PHURINE 7.0 11/29/2016 1707   GLUCOSEU NEGATIVE 11/29/2016 1707   HGBUR SMALL (A) 11/29/2016 1707   HGBUR trace-intact 01/17/2010 0932   BILIRUBINUR NEGATIVE 11/29/2016 1707   BILIRUBINUR n 09/01/2011 1157   KETONESUR NEGATIVE 11/29/2016 1707   PROTEINUR NEGATIVE 11/29/2016 1707   UROBILINOGEN 0.2 09/27/2014 1800   NITRITE NEGATIVE 11/29/2016 1707   LEUKOCYTESUR  NEGATIVE 11/29/2016 1707    STUDIES: She had been scheduled for bilateral breast ultrasonography after the last visit but for some reason that was never scheduled.  ASSESSMENT: 83 y.o. [place] woman with bilateral breast cancer, as follows:  (1) status post right breastt overlapping sites skin biopsy 11/21/2014 for a clinical T4 N1, stage IIIB invasive ductal carcinoma, estrogen and progesterone receptor positive, HER-2 amplified  (2) status post 2 separate left breast upper outer quadrant biopsies and one left axillary lymph node biopsy 12/04/2014, all positive for a clinical mT1c N1, stage IIA invasive lobular carcinoma, estrogen receptor positive, progesterone receptor variable, with MIB-1 between 10 and 40%, and no HER-2 amplification  (3) neoadjuvant anastrozole started 12/14/2014. Discontinued with progression by repeat left breast US May 2017  (4) definitive surgery to consist of bilateral mastectomies without formal axillary dissection: Postponed  METASTATIC DISEASE: March 2016 (5) PET scan 12/19/2014 shows in addition to the bilateral breast and bilateral axillary masses, a lesion at the T11 vertebral body, showing an irregular lucency on prior CT exam  (a) adjuvant radiationt to T10-T12: 04/11/2015 through 04/24/2015 to a total dose of 30 gray in 10 fractions  (6) consider anti-HER-2 treatment depending on response to antiestrogen therapy alone.    (7) osteoporosis with T score of -2.6 on bone scan obtained 02/19/2015.  (a) zolendronate started 03/20/2015, repeated every 12 weeks   (8) fulvestrant started 03/04/16  (a) patient refused adding palbociclib  (b) PET scan in 10/15/2016 showed mild increase in the right breast mass and increased size and activity of 3.2 cm of solid left lung nodule  (c) letrozole added 10/16/2016  (d) PET  scan obtained 02/10/2017 showing a significant decrease in SUV in the measurable disease  (9) Doppler ultrasound both lower extremities  11/17/2016 and repeat 11/29/2016 are  negative  PLAN: Lauren Buchanan is now over 2 years out from definitive diagnosis of metastatic disease, with very good disease control clinically.  She forgets that she has breast cancer in the right breast and specifically the right nipple area. When she notices this she wonders why the left breast doesn't have the same problem. She does have some pain associated with this at times. She requests pain medication and she receives it, possibly meloxicam or similar. She denies any significant change overall however. Her daughter concurs.  I don't know why she had more pain with the last fulvestrant dose. Hopefully that will not be the case today.  By my exam the right breast is very stable if not slightly improved. Accordingly I am comfortable with her not having had the ultrasound that she requested last visit.  She will continue the fulvestrant every 4 weeks. She will have her zolendronate in November. She is going to return to see me in January and at the very end of December she will have a restaging PET scan  They know to call for any problems that may develop before the next visit.  Lauren Buchanan, [name], MD  08/04/17 3:48 PM Medical Oncology and Hematology [facility] [address] [place], [place] [address] Tel. [phone]    Fax. [phone]  This document serves as a record of services personally performed by Lurline Del, MD. It was created on her behalf by Steva Colder, a trained medical scribe. The creation of this record is based on the scribe's personal observations and the provider's statements to them. This document has been checked and approved by the attending provider.

## 2017-08-04 ENCOUNTER — Telehealth: Payer: Self-pay | Admitting: Oncology

## 2017-08-04 ENCOUNTER — Ambulatory Visit (HOSPITAL_BASED_OUTPATIENT_CLINIC_OR_DEPARTMENT_OTHER): Payer: Medicare Other

## 2017-08-04 ENCOUNTER — Ambulatory Visit (HOSPITAL_BASED_OUTPATIENT_CLINIC_OR_DEPARTMENT_OTHER): Payer: Medicare Other | Admitting: Oncology

## 2017-08-04 ENCOUNTER — Other Ambulatory Visit (HOSPITAL_BASED_OUTPATIENT_CLINIC_OR_DEPARTMENT_OTHER): Payer: Medicare Other

## 2017-08-04 VITALS — BP 136/67 | HR 77 | Temp 97.7°F | Resp 20 | Ht 60.0 in | Wt 123.5 lb

## 2017-08-04 DIAGNOSIS — C50412 Malignant neoplasm of upper-outer quadrant of left female breast: Secondary | ICD-10-CM

## 2017-08-04 DIAGNOSIS — C50912 Malignant neoplasm of unspecified site of left female breast: Secondary | ICD-10-CM

## 2017-08-04 DIAGNOSIS — Z5111 Encounter for antineoplastic chemotherapy: Secondary | ICD-10-CM

## 2017-08-04 DIAGNOSIS — C3492 Malignant neoplasm of unspecified part of left bronchus or lung: Secondary | ICD-10-CM

## 2017-08-04 DIAGNOSIS — C7951 Secondary malignant neoplasm of bone: Secondary | ICD-10-CM

## 2017-08-04 DIAGNOSIS — Z170 Estrogen receptor positive status [ER+]: Secondary | ICD-10-CM

## 2017-08-04 DIAGNOSIS — C773 Secondary and unspecified malignant neoplasm of axilla and upper limb lymph nodes: Secondary | ICD-10-CM

## 2017-08-04 DIAGNOSIS — C50811 Malignant neoplasm of overlapping sites of right female breast: Secondary | ICD-10-CM

## 2017-08-04 DIAGNOSIS — M818 Other osteoporosis without current pathological fracture: Secondary | ICD-10-CM

## 2017-08-04 DIAGNOSIS — C50911 Malignant neoplasm of unspecified site of right female breast: Secondary | ICD-10-CM

## 2017-08-04 LAB — CBC WITH DIFFERENTIAL/PLATELET
BASO%: 0.3 % (ref 0.0–2.0)
Basophils Absolute: 0 10*3/uL (ref 0.0–0.1)
EOS%: 1 % (ref 0.0–7.0)
Eosinophils Absolute: 0.1 10*3/uL (ref 0.0–0.5)
HCT: 39 % (ref 34.8–46.6)
HGB: 12.6 g/dL (ref 11.6–15.9)
LYMPH%: 9.5 % — AB (ref 14.0–49.7)
MCH: 29.3 pg (ref 25.1–34.0)
MCHC: 32.3 g/dL (ref 31.5–36.0)
MCV: 90.7 fL (ref 79.5–101.0)
MONO#: 0.5 10*3/uL (ref 0.1–0.9)
MONO%: 6.8 % (ref 0.0–14.0)
NEUT#: 6.6 10*3/uL — ABNORMAL HIGH (ref 1.5–6.5)
NEUT%: 82.4 % — AB (ref 38.4–76.8)
PLATELETS: 252 10*3/uL (ref 145–400)
RBC: 4.3 10*6/uL (ref 3.70–5.45)
RDW: 13.9 % (ref 11.2–14.5)
WBC: 8 10*3/uL (ref 3.9–10.3)
lymph#: 0.8 10*3/uL — ABNORMAL LOW (ref 0.9–3.3)

## 2017-08-04 LAB — COMPREHENSIVE METABOLIC PANEL
ALBUMIN: 3.3 g/dL — AB (ref 3.5–5.0)
ALK PHOS: 92 U/L (ref 40–150)
ALT: 19 U/L (ref 0–55)
AST: 27 U/L (ref 5–34)
Anion Gap: 6 mEq/L (ref 3–11)
BILIRUBIN TOTAL: 0.71 mg/dL (ref 0.20–1.20)
BUN: 13.9 mg/dL (ref 7.0–26.0)
CALCIUM: 9 mg/dL (ref 8.4–10.4)
CO2: 27 mEq/L (ref 22–29)
Chloride: 101 mEq/L (ref 98–109)
Creatinine: 0.8 mg/dL (ref 0.6–1.1)
GLUCOSE: 147 mg/dL — AB (ref 70–140)
POTASSIUM: 4.9 meq/L (ref 3.5–5.1)
SODIUM: 134 meq/L — AB (ref 136–145)
TOTAL PROTEIN: 6.4 g/dL (ref 6.4–8.3)

## 2017-08-04 MED ORDER — FULVESTRANT 250 MG/5ML IM SOLN
500.0000 mg | Freq: Once | INTRAMUSCULAR | Status: AC
  Administered 2017-08-04: 500 mg via INTRAMUSCULAR
  Filled 2017-08-04: qty 10

## 2017-08-04 NOTE — Telephone Encounter (Signed)
Gave patient AVS and calendar of upcoming November through January appointments.

## 2017-08-05 ENCOUNTER — Encounter: Payer: Medicare Other | Admitting: Nurse Practitioner

## 2017-08-05 NOTE — Progress Notes (Signed)
Location:      Place of Service:    Provider: Marlana Latus NP  Code Status: DNR Goals of Care:  Advanced Directives 07/01/2017  Does Patient Have a Medical Advance Directive? Yes  Type of Advance Directive [place]  Does patient want to make changes to medical advance directive? No - Patient declined  Copy of [place] in Chart? Yes  Would patient like information on creating a medical advance directive? -  Pre-existing out of facility DNR order (yellow form or pink MOST form) -     No chief complaint on file.   HPI: Patient is a 83 y.o. female seen today for medical management of chronic diseases.     Past Medical History:  Diagnosis Date  . Asthma   . Breast cancer ([place]) 12/08/2014   Bilateral  . Cancer of central portion of female breast ([place]) 12/04/2014  . Cancer of central portion of female breast ([place]) 12/04/2014  . Complete heart block ([place]) 1999   s/p PPM by Dr Olevia Perches, most recent generator change 2009  . COPD (chronic obstructive pulmonary disease) ([place])   . Diverticulosis   . DJD (degenerative joint disease)   . Esophageal stricture   . GERD (gastroesophageal reflux disease)   . Giant cell arteritis ([place])   . Goiter   . Hemorrhoids   . Hiatal hernia   . IBS (irritable bowel syndrome)   . Lung cancer ([place])   . Paroxysmal atrial fibrillation ([place]) 12/17/2015   asymptomatic, <1%, determined on regular pacemaker interrogation  . Pneumonia   . Renal cyst   . S/P radiation therapy 04/24/15 completed   T-11  . Wears glasses     Past Surgical History:  Procedure Laterality Date  . ANAL FISSURE REPAIR    . ARTERY BIOPSY Left 01/03/2013   Procedure: BIOPSY LEFT TEMPORAL ARTERY;  Surgeon: Ascencion Dike, MD;  Location: [facility];  Service: ENT;  Laterality: Left;  . BREAST BIOPSY Right   . CHOLECYSTECTOMY    . COLONOSCOPY    . FRACTURE SURGERY     fx lt wrist  . hemorrhoidectomy    . left lower lobectomy for  brochiectasis  1950  . MOUTH SURGERY    . PACEMAKER INSERTION  1999   Gen change (SJM) by Dr Olevia Perches 2009  . RECTAL POLYPECTOMY    . RENAL CYST EXCISION    . TONSILLECTOMY      Allergies  Allergen Reactions  . Morphine And Related Nausea Only  . Amoxicillin-Pot Clavulanate Other (See Comments)    Unknown     Allergies as of 08/05/2017      Reactions   Morphine And Related Nausea Only   Amoxicillin-pot Clavulanate Other (See Comments)   Unknown       Medication List       Accurate as of 08/05/17  2:32 PM. Always use your most recent med list.          CITRACAL PETITES/VITAMIN D 200-250 MG-UNIT Tabs Generic drug:  Calcium Citrate-Vitamin D Take 1 tablet by mouth daily.   letrozole 2.5 MG tablet Commonly known as:  FEMARA Take 1 tablet (2.5 mg total) by mouth daily.   meloxicam 15 MG tablet Commonly known as:  MOBIC Take 15 mg by mouth as needed for pain.   metoprolol succinate 100 MG 24 hr tablet Commonly known as:  TOPROL-XL TAKE 1/2 (ONE-HALF) TABLET BY MOUTH EVERY MORNING. Take with or immediately following a  meal.   PENNSAID 2 % Soln Generic drug:  Diclofenac Sodium Place onto the skin. Apply topical twice a day as needed   diclofenac sodium 1 % Gel Commonly known as:  VOLTAREN Apply topically 3 (three) times daily as needed.   predniSONE 5 MG tablet Commonly known as:  DELTASONE Take 5 mg by mouth daily with breakfast. Reported on 11/19/2015   traMADol 50 MG tablet Commonly known as:  ULTRAM Take 50 mg by mouth every 6 (six) hours as needed.   TURMERIC PO Take 1 capsule by mouth daily.       Review of Systems:  Review of Systems  Health Maintenance  Topic Date Due  . PNA vac Low Risk Adult (2 of 2 - PCV13) 01/18/2011  . INFLUENZA VACCINE  05/19/2017  . TETANUS/TDAP  07/24/2017  . DEXA SCAN  Completed    Physical Exam: There were no vitals filed for this visit. There is no height or weight on file to calculate BMI. Physical  Exam  Labs reviewed: Basic Metabolic Panel:  Recent Labs  11/29/16 1749  06/09/17 1447 07/08/17 1408 08/04/17 1447  NA 136  < > 137 135* 134*  K 4.1  < > 4.1 4.1 4.9  CL 100*  --  103  --   --   CO2 30  < > 26 27 27   GLUCOSE 100*  < > 154* 84 147*  BUN 12  < > 18 12.0 13.9  CREATININE 0.61  < > 0.88 0.8 0.8  CALCIUM 9.4  < > 9.3 9.3 9.0  < > = values in this interval not displayed. Liver Function Tests:  Recent Labs  06/09/17 1447 07/08/17 1408 08/04/17 1447  AST 27 23 27   ALT 19 16 19   ALKPHOS 76 84 92  BILITOT 0.5 1.01 0.71  PROT 6.2* 6.4 6.4  ALBUMIN 3.4* 3.3* 3.3*    Recent Labs  11/29/16 1749  LIPASE 29   No results for input(s): AMMONIA in the last 8760 hours. CBC:  Recent Labs  06/09/17 1447 07/08/17 1408 08/04/17 1447  WBC 7.8 8.6 8.0  NEUTROABS 6.7* 6.6* 6.6*  HGB 12.2 12.5 12.6  HCT 37.7 37.5 39.0  MCV 91.7 90.1 90.7  PLT 213 250 252   Lipid Panel: No results for input(s): CHOL, HDL, LDLCALC, TRIG, CHOLHDL, LDLDIRECT in the last 8760 hours. Lab Results  Component Value Date   HGBA1C 5.4 02/23/2017    Procedures since last visit: No results found.  Assessment/Plan  No problem-specific Assessment & Plan notes found for this encounter.   Labs/tests ordered:   Next appt:  6 months   Time spend 25 minutes.

## 2017-08-13 NOTE — Progress Notes (Signed)
This encounter was created in error - please disregard.

## 2017-08-17 IMAGING — CT NM PET TUM IMG INITIAL (PI) SKULL BASE T - THIGH
7 of 8 series · 19 of 25 positions shown · non-contrast
Comparison: PET-CT 10/15/2016 and 12/19/2014.

CLINICAL DATA: Subsequent treatment strategy for bilateral breast
cancer metastatic to bone. Hypermetabolic left lung lesion. Assess
treatment options.

EXAM:
NUCLEAR MEDICINE PET SKULL BASE TO THIGH
TECHNIQUE: 6.2 mCi F-18 FDG was injected intravenously. Full-ring PET imaging
was performed from the skull base to thigh after the radiotracer. CT
data was obtained and used for attenuation correction and anatomic
localization.
FASTING BLOOD GLUCOSE:  Value: 107 mg/dl

[Series 3: pet sk_thigh ac · axial · 5.0mm · 4.07mm/px · z∈[-572,+228]mm · 3 of 201 slices shown]
[im 1/201]
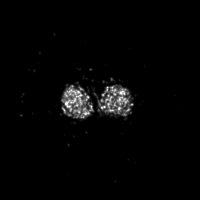
[im 67/201]
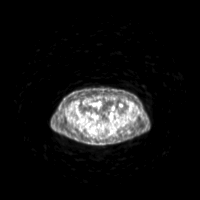
[im 201/201]
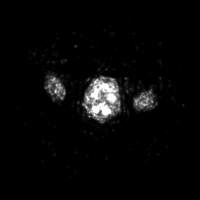

[Series 4: ct sk_thigh 5.0 b31f · axial · 5.0mm · 0.98mm/px · z∈[-572,+228]mm · 4 of 201 slices shown]
[im 1/201]
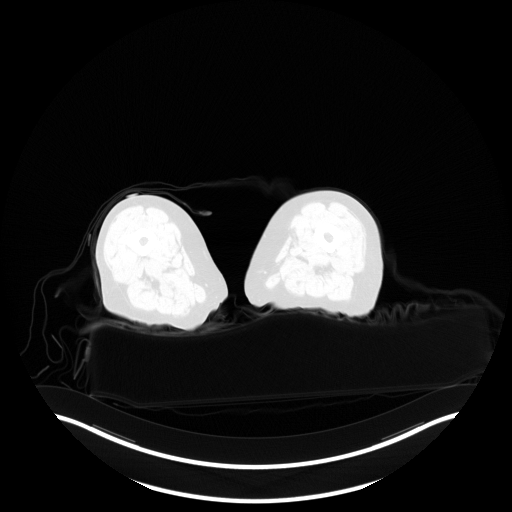
[im 51/201]
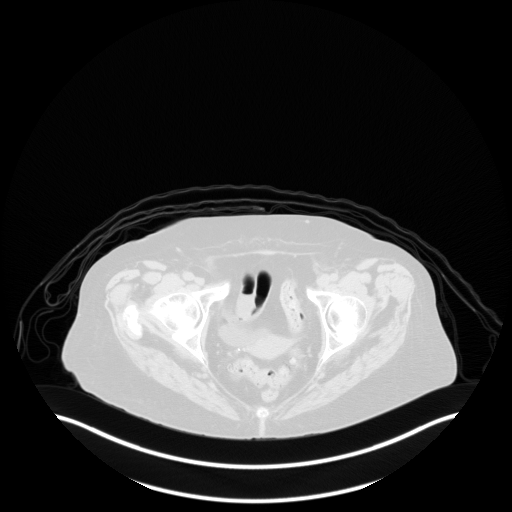
[im 151/201]
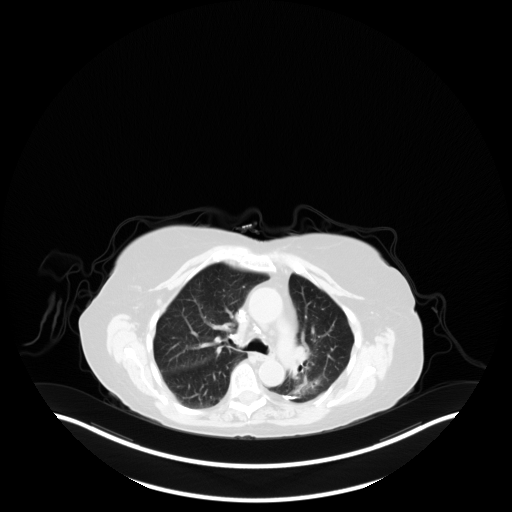
[im 201/201]
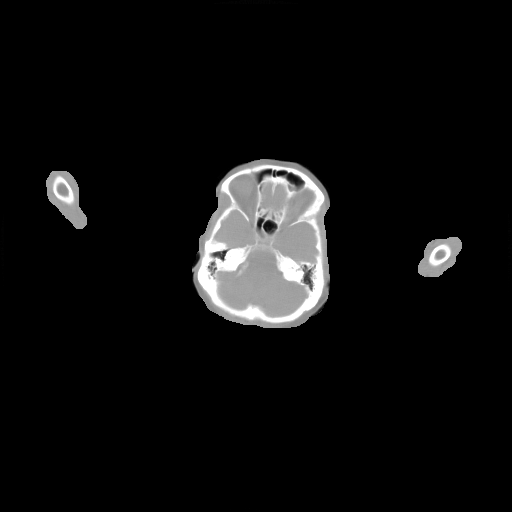

[Series 7: ct sk_thigh 5.0 b70f (id)_bone · axial · 5.0mm · 0.54mm/px · 1 of 63 slices shown]
[im 1/63  bone]
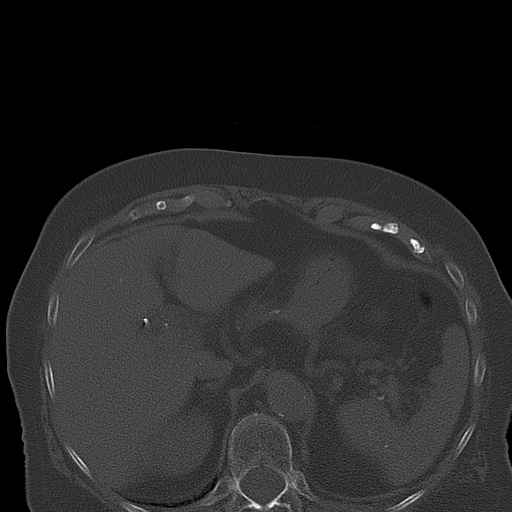

[Series 8: pet sk_thigh nac · axial · 5.0mm · 4.07mm/px · z∈[-572,+228]mm · 4 of 201 slices shown]
[im 1/201]
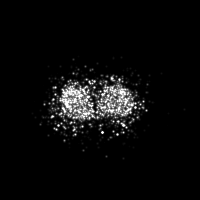
[im 51/201]
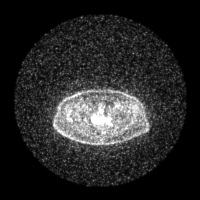
[im 101/201]
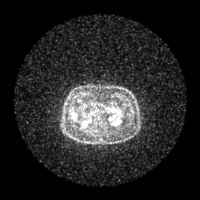
[im 201/201]
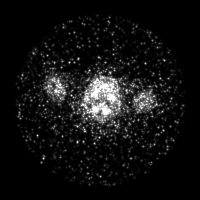

[Series 604: range-ct sk_thigh 5.0 (id)<alpha range> · 2 of 83 slices shown (1 of 2)]
[im 1/83]
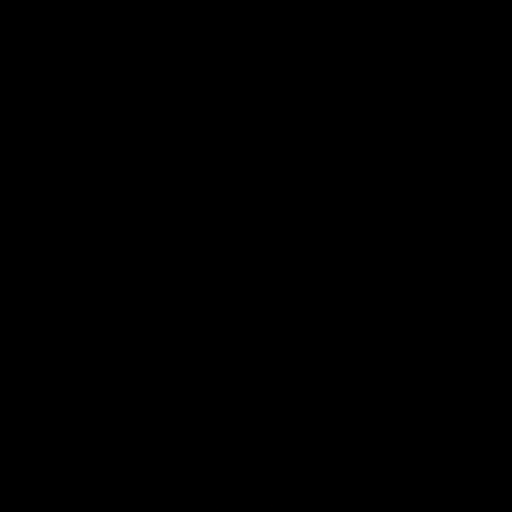
[im 83/83]
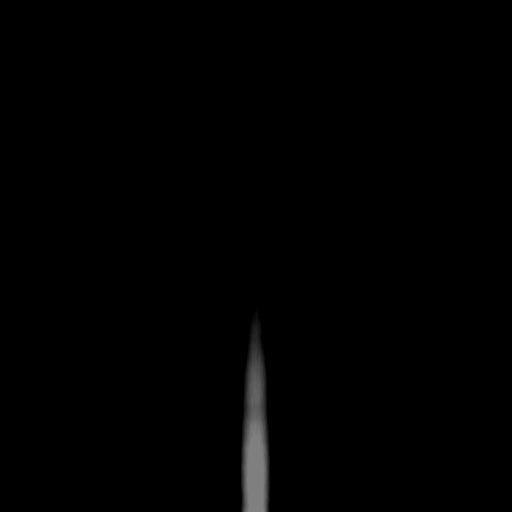

[Series 606: range-ct sk_thigh 5.0 (id)<alpha range> · 4 of 192 slices shown (2 of 2)]
[im 1/192]
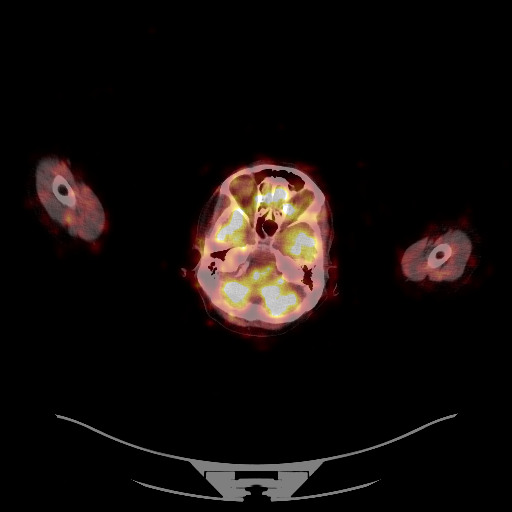
[im 48/192]
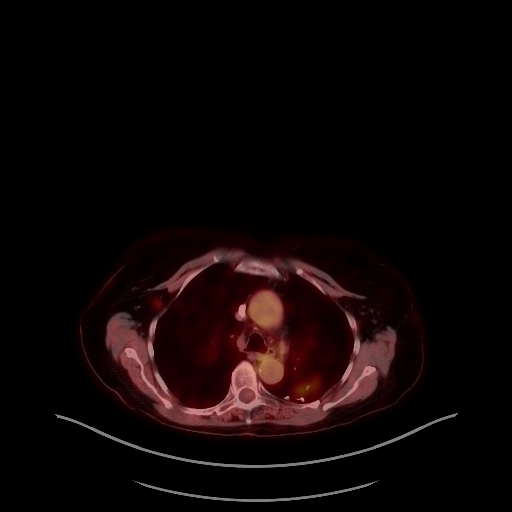
[im 96/192]
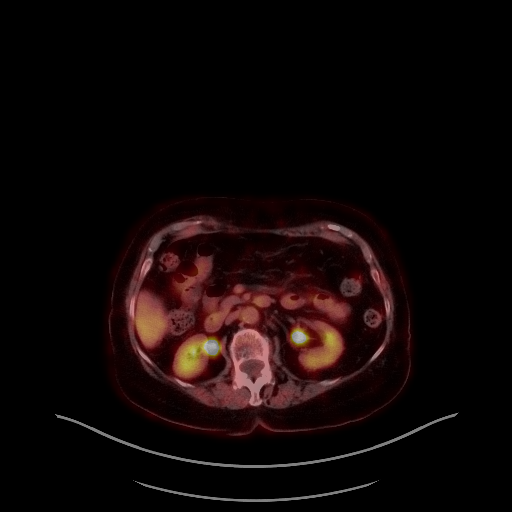
[im 192/192]
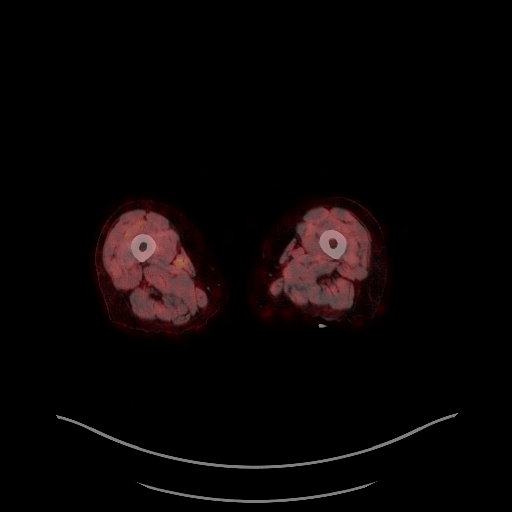

[Series 1026: results mm oncology reading · 0.46mm/px · 1 of 4 slices shown]
[im 1/4]
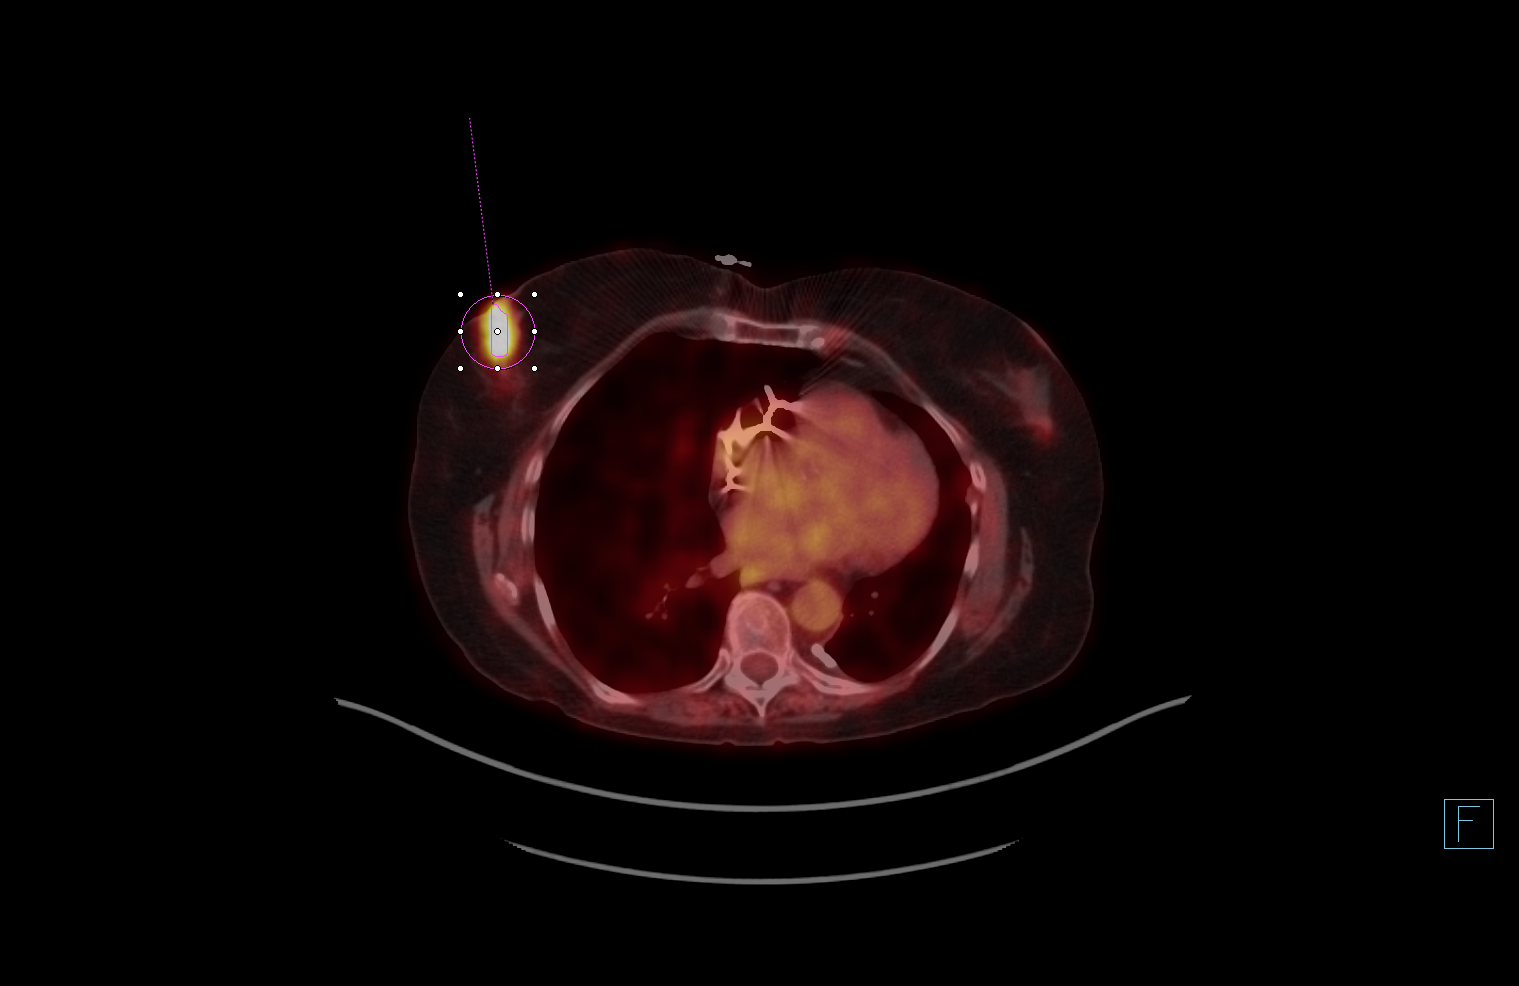

[19 of 25 positions shown; findings below may reference images not displayed]

FINDINGS: NECK

No hypermetabolic cervical lymph nodes are identified.There are no
lesions of the pharyngeal mucosal space. Stable mild thyroid
nodularity without abnormal metabolic activity.

CHEST

Hypermetabolic breast masses are again noted bilaterally. Subareolar
right breast mass measures approximately 2.9 x 2.2 cm and has an SUV
max of 11.2 (previously 19.8). Small hypermetabolic left breast mass
measures 2.1 x 1.1 cm on image 59 and has an SUV max of
(previously 6.5). Hypermetabolic right axillary and subpectoral
adenopathy has mildly improved. The dominant node on image 58 has an
SUV max of 8.6 (previously 13.6). There is no hypermetabolic
mediastinal or hilar adenopathy.

Postsurgical changes are again noted within the left lung. The sub
solid mass posteriorly in the left upper lobe is similar in size to
the previous study, measuring 3.3 x 1.7 cm on image 48 (3.2 x 1.8 cm
previously). Its hypermetabolic activity has mildly improved (SUV
max 2.4 currently, 5.1 previously). There are no new or enlarging
pulmonary nodules. Right upper lobe calcified granuloma, hiatal
hernia and pacemaker noted.

ABDOMEN/PELVIS

There is no hypermetabolic activity within the liver, adrenal
glands, spleen or pancreas. There is no hypermetabolic nodal
activity. Prior cholecystectomy, aortoiliac atherosclerosis and
sigmoid diverticulosis are noted.

SKELETON

There is no hypermetabolic activity to suggest osseous metastatic
disease. There is no residual hypermetabolic activity within the
radiated T11 lesion which remains sclerotic. There is a surrounding
band of decreased metabolic activity attributed to radiation
therapy.
IMPRESSION: 1. Positive response to interval therapy. There is decreased
metabolic activity within the bilateral breast masses and right
axillary lymphadenopathy.
2. The sub solid left lung lesion also demonstrates mildly decreased
metabolic activity in the interval.
3. No disease progression identified.

## 2017-08-26 ENCOUNTER — Encounter: Payer: Self-pay | Admitting: Nurse Practitioner

## 2017-08-26 ENCOUNTER — Non-Acute Institutional Stay: Payer: Medicare Other | Admitting: Nurse Practitioner

## 2017-08-26 DIAGNOSIS — R7989 Other specified abnormal findings of blood chemistry: Secondary | ICD-10-CM

## 2017-08-26 DIAGNOSIS — E049 Nontoxic goiter, unspecified: Secondary | ICD-10-CM

## 2017-08-26 DIAGNOSIS — M316 Other giant cell arteritis: Secondary | ICD-10-CM

## 2017-08-26 DIAGNOSIS — J449 Chronic obstructive pulmonary disease, unspecified: Secondary | ICD-10-CM

## 2017-08-26 DIAGNOSIS — I471 Supraventricular tachycardia: Secondary | ICD-10-CM

## 2017-08-26 DIAGNOSIS — C50811 Malignant neoplasm of overlapping sites of right female breast: Secondary | ICD-10-CM

## 2017-08-26 DIAGNOSIS — Z170 Estrogen receptor positive status [ER+]: Secondary | ICD-10-CM

## 2017-08-26 NOTE — Assessment & Plan Note (Signed)
Stable

## 2017-08-26 NOTE — Progress Notes (Signed)
Location:  [place] Room Number: 361 Place of Service:  ALF [phone]) Provider:  Raney Koeppen, Manxie  NP  Blanchie Serve, MD  Patient Care Team: Blanchie Serve, MD as PCP - General (Internal Medicine) Lauren Skates, MD as Consulting Physician (General Surgery) Lauren, [name], MD as Consulting Physician (Oncology) Lauren Rudd, MD as Consulting Physician (Radiation Oncology) Lauren Germany, RN as Registered Nurse Lauren Kaufmann, RN as Registered Nurse Lauren Fitz, MD as Consulting Physician (Family Medicine) Lauren Tu X, NP as Nurse Practitioner (Internal Medicine)  Extended Emergency Contact Information Primary Emergency Contact: Lauren Buchanan Address: [address]          [place], [place] [address] [place] Mobile Phone: [phone] Relation: Daughter Secondary Emergency Contact: Lauren Buchanan States of [place] Phone: [phone] Relation: Other  Code Status:  DNR Goals of care: Advanced Directive information Advanced Directives 07/01/2017  Does Patient Have a Medical Advance Directive? Yes  Type of Advance Directive [place]  Does patient want to make changes to medical advance directive? No - Patient declined  Copy of [place] in Chart? Yes  Would patient like information on creating a medical advance directive? -  Pre-existing out of facility DNR order (yellow form or pink MOST form) -     Chief Complaint  Patient presents with  . Medical Management of Chronic Issues    HPI:  Pt is a 83 y.o. female seen today for medical management of chronic diseases.    The patient has history of Goiter,  12/13/12 TSH 0.33, the patient denied change in appetite or frequent BM, stable blood pressures, denied palpitation, tremors in hands, anxiety, insomnia, or weight loss. Breast cancer, f/u Oncology, currently taking Femara 2.5mg  qd. Temporal arteritis, stable, maintained on  Prednisone 5mg  qd. SVT/PSVT/PAT, heart rate is controlled on Metoprolol 100mg  qd.    Past Medical History:  Diagnosis Date  . Asthma   . Breast cancer ([place]) 12/08/2014   Bilateral  . Cancer of central portion of female breast ([place]) 12/04/2014  . Cancer of central portion of female breast ([place]) 12/04/2014  . Complete heart block ([place]) 1999   s/p PPM by Dr Olevia Perches, most recent generator change 2009  . COPD (chronic obstructive pulmonary disease) ([place])   . Diverticulosis   . DJD (degenerative joint disease)   . Esophageal stricture   . GERD (gastroesophageal reflux disease)   . Giant cell arteritis ([place])   . Goiter   . Hemorrhoids   . Hiatal hernia   . IBS (irritable bowel syndrome)   . Lung cancer ([place])   . Paroxysmal atrial fibrillation ([place]) 12/17/2015   asymptomatic, <1%, determined on regular pacemaker interrogation  . Pneumonia   . Renal cyst   . S/P radiation therapy 04/24/15 completed   T-11  . Wears glasses    Past Surgical History:  Procedure Laterality Date  . ANAL FISSURE REPAIR    . BREAST BIOPSY Right   . CHOLECYSTECTOMY    . COLONOSCOPY    . FRACTURE SURGERY     fx lt wrist  . hemorrhoidectomy    . left lower lobectomy for brochiectasis  1950  . MOUTH SURGERY    . PACEMAKER INSERTION  1999   Gen change ([facility]) by Dr Olevia Perches 2009  . RECTAL POLYPECTOMY    . RENAL CYST EXCISION    . TONSILLECTOMY      Allergies  Allergen Reactions  . Morphine And  Related Nausea Only  . Amoxicillin-Pot Clavulanate Other (See Comments)    Unknown     Outpatient Encounter Medications as of 08/26/2017  Medication Sig  . Calcium Citrate-Vitamin D (CITRACAL PETITES/VITAMIN D) 200-250 MG-UNIT TABS Take 1 tablet by mouth daily.    . diclofenac sodium (VOLTAREN) 1 % GEL Apply topically 3 (three) times daily as needed.  . lactose free nutrition (BOOST) LIQD Take 237 mLs daily by mouth.  . letrozole (FEMARA) 2.5 MG tablet Take 1 tablet (2.5 mg total) by mouth daily.  . metoprolol  succinate (TOPROL-XL) 100 MG 24 hr tablet TAKE 1/2 (ONE-HALF) TABLET BY MOUTH EVERY MORNING. Take with or immediately following a meal.  . predniSONE (DELTASONE) 5 MG tablet Take 5 mg by mouth daily with breakfast. Reported on 11/19/2015  . traMADol (ULTRAM) 50 MG tablet Take 50 mg by mouth every 6 (six) hours as needed.   . TURMERIC PO Take 1 capsule by mouth daily.  . [DISCONTINUED] Diclofenac Sodium (PENNSAID) 2 % SOLN Place onto the skin. Apply topical twice a day as needed  . [DISCONTINUED] meloxicam (MOBIC) 15 MG tablet Take 15 mg by mouth as needed for pain.   No facility-administered encounter medications on file as of 08/26/2017.     Review of Systems  Constitutional: Negative for appetite change, chills, diaphoresis, fatigue and fever.  HENT: Positive for hearing loss. Negative for congestion, trouble swallowing and voice change.   Eyes: Negative for visual disturbance.  Respiratory: Negative for cough, choking, chest tightness, shortness of breath and wheezing.   Cardiovascular: Negative for chest pain, palpitations and leg swelling.       Pacemaker  Gastrointestinal: Negative for abdominal distention, abdominal pain, constipation, diarrhea, nausea and vomiting.  Endocrine: Negative for heat intolerance.  Genitourinary: Negative for difficulty urinating, dysuria, frequency and urgency.  Musculoskeletal: Negative for arthralgias, back pain, gait problem and joint swelling.       Driving  Skin: Negative for color change, pallor, rash and wound.  Neurological: Negative for tremors, speech difficulty, numbness and headaches.  Psychiatric/Behavioral: Negative for agitation, behavioral problems, confusion, hallucinations and sleep disturbance. The patient is not nervous/anxious.     Immunization History  Administered Date(s) Administered  . Influenza Split 07/24/2011, 06/20/2012  . Influenza Whole 10/19/2001, 07/25/2007, 08/02/2008, 07/18/2009, 07/10/2010  . Influenza,inj,Quad PF,6+  Mos 07/18/2013, 08/27/2014  . Influenza-Unspecified 07/31/2015, 08/19/2016, 08/09/2017  . Pneumococcal Polysaccharide-23 10/19/1997, 01/17/2010  . Td 10/19/1996, 07/25/2007   Pertinent  Health Maintenance Due  Topic Date Due  . PNA vac Low Risk Adult (2 of 2 - PCV13) 01/18/2011  . INFLUENZA VACCINE  Completed  . DEXA SCAN  Completed   Fall Risk  07/01/2017 02/22/2017 05/22/2015 03/22/2015 08/02/2014  Falls in the past year? No No No No No   Functional Status Survey:    Vitals:   08/26/17 1048  BP: (!) 110/58  Pulse: 72  Resp: 20  Temp: 98.4 F (36.9 C)  Weight: 123 lb (55.8 kg)  Height: 5' (1.524 m)   Body mass index is 24.02 kg/m. Physical Exam  Constitutional: She appears well-developed and well-nourished. No distress.  HENT:  Head: Normocephalic and atraumatic.  Mouth/Throat: Oropharynx is clear and moist.  Eyes: Conjunctivae and EOM are normal. Pupils are equal, round, and reactive to light. Right eye exhibits no discharge. Left eye exhibits no discharge.  Neck: Normal range of motion. Neck supple. No JVD present. No thyromegaly present.  Cardiovascular: Normal rate and regular rhythm.  Murmur heard. Left upper chest pacemaker.  Pulmonary/Chest: Effort normal and breath sounds normal. She has no wheezes. She has no rales.  Abdominal: Soft. Bowel sounds are normal. She exhibits no distension. There is no tenderness.  Musculoskeletal: Normal range of motion. She exhibits no edema or tenderness.  Lymphadenopathy:    She has no cervical adenopathy.  Neurological: She is alert. She exhibits normal muscle tone. Coordination normal.  Skin: Skin is warm and dry. She is not diaphoretic.  Psychiatric: She has a normal mood and affect. Her behavior is normal. Judgment and thought content normal.    Labs reviewed: Recent Labs    11/29/16 1749  06/09/17 1447 07/08/17 1408 08/04/17 1447  NA 136   < > 137 135* 134*  K 4.1   < > 4.1 4.1 4.9  CL 100*  --  103  --   --   CO2 30    < > 26 27 27   GLUCOSE 100*   < > 154* 84 147*  BUN 12   < > 18 12.0 13.9  CREATININE 0.61   < > 0.88 0.8 0.8  CALCIUM 9.4   < > 9.3 9.3 9.0   < > = values in this interval not displayed.   Recent Labs    06/09/17 1447 07/08/17 1408 08/04/17 1447  AST 27 23 27   ALT 19 16 19   ALKPHOS 76 84 92  BILITOT 0.5 1.01 0.71  PROT 6.2* 6.4 6.4  ALBUMIN 3.4* 3.3* 3.3*   Recent Labs    06/09/17 1447 07/08/17 1408 08/04/17 1447  WBC 7.8 8.6 8.0  NEUTROABS 6.7* 6.6* 6.6*  HGB 12.2 12.5 12.6  HCT 37.7 37.5 39.0  MCV 91.7 90.1 90.7  PLT 213 250 252   Lab Results  Component Value Date   TSH 1.07 08/31/2017   Lab Results  Component Value Date   HGBA1C 5.4 02/23/2017   Lab Results  Component Value Date   CHOL 194 07/25/2007   HDL 62.7 07/25/2007   LDLCALC 118 (H) 07/25/2007   TRIG 69 07/25/2007   CHOLHDL 3.1 CALC 07/25/2007    Significant Diagnostic Results in last 30 days:  No results found.  Assessment/Plan Decreased thyroid stimulating hormone (TSH) level 12/13/12 TSH 0.33, the patient denied change in appetite or frequent BM, stable blood pressures, denied palpitation, tremors in hands, anxiety, insomnia, or weight loss. Update TSH  Malignant neoplasm of overlapping sites of right breast in female, estrogen receptor positive ([place]) F/u Oncology, continue Femara 2.5mg  po daily.   Goiter Last TSH 0.33 12/13/12, update TSH  COPD with chronic bronchitis Stable  Giant cell arteritis Stable, continue Prednisone 5mg  qd  SVT/ PSVT/ PAT Heart rate is in control, continue Metoprolol 100mg  po daily.      Family/ staff Communication: plan of care reviewed with the patient and charge nurse.   Labs/tests ordered: TSH  Time spend 25 minutes.

## 2017-08-26 NOTE — Assessment & Plan Note (Signed)
Heart rate is in control, continue Metoprolol 100mg  po daily.

## 2017-08-26 NOTE — Assessment & Plan Note (Signed)
Stable, continue Prednisone 5mg  qd

## 2017-08-26 NOTE — Assessment & Plan Note (Signed)
Last TSH 0.33 12/13/12, update TSH

## 2017-08-26 NOTE — Assessment & Plan Note (Signed)
F/u Oncology, continue Femara 2.5mg  po daily.

## 2017-08-26 NOTE — Progress Notes (Deleted)
Location:    [facility] Room Number: 350 Place of Service: [place] Provider:  [facility] Colene M[place] NP  Blanchie Serve, MD  Patient Care Team: Blanchie Serve, MD as PCP - General (Internal Medicine) Fanny Skates, MD as Consulting Physician (General Surgery) [name], [name], MD as Consulting Physician (Oncology) [name], MD as Consulting Physician (Radiation Oncology) Rockwell Germany, RN as Registered Nurse Mauro Kaufmann, RN as Registered Nurse Gentry Fitz, MD as Consulting Physician (Family Medicine) Jameica Couts X, NP as Nurse Practitioner (Internal Medicine)  Extended Emergency Contact Information Primary Emergency Contact: Matthews,[name] Address: [address]          [place], [place] [address] [place] Mobile Phone: [phone] Relation: Daughter Secondary Emergency Contact: Clent Demark [place] of [place] Phone: [phone] Relation: Other  Code Status:  DNR Goals of care: Advanced Directive information Advanced Directives 07/01/2017  Does Patient Have a Medical Advance Directive? Yes  Type of Advance Directive [place]  Does patient want to make changes to medical advance directive? No - Patient declined  Copy of [place] in Chart? Yes  Would patient like information on creating a medical advance directive? -  Pre-existing out of facility DNR order (yellow form or pink MOST form) -     Chief Complaint  Patient presents with  . Medical Management of Chronic Issues    HPI:  Pt is a 83 y.o. female seen today for medical management of chronic diseases.     Past Medical History:  Diagnosis Date  . Asthma   . Breast cancer ([place]) 12/08/2014   Bilateral  . Cancer of central portion of female breast ([place]) 12/04/2014  . Cancer of central portion of female breast ([place]) 12/04/2014  . Complete heart block ([place]) 1999   s/p PPM by Dr Olevia Perches, most recent generator change 2009  . COPD (chronic  obstructive pulmonary disease) ([place])   . Diverticulosis   . DJD (degenerative joint disease)   . Esophageal stricture   . GERD (gastroesophageal reflux disease)   . Giant cell arteritis ([place])   . Goiter   . Hemorrhoids   . Hiatal hernia   . IBS (irritable bowel syndrome)   . Lung cancer ([place])   . Paroxysmal atrial fibrillation ([place]) 12/17/2015   asymptomatic, <1%, determined on regular pacemaker interrogation  . Pneumonia   . Renal cyst   . S/P radiation therapy 04/24/15 completed   T-11  . Wears glasses    Past Surgical History:  Procedure Laterality Date  . ANAL FISSURE REPAIR    . BREAST BIOPSY Right   . CHOLECYSTECTOMY    . COLONOSCOPY    . FRACTURE SURGERY     fx lt wrist  . hemorrhoidectomy    . left lower lobectomy for brochiectasis  1950  . MOUTH SURGERY    . PACEMAKER INSERTION  1999   Gen change (S[facility]) by Dr Olevia Perches 2009  . RECTAL POLYPECTOMY    . RENAL CYST EXCISION    . TONSILLECTOMY      Allergies  Allergen Reactions  . Morphine And Related Nausea Only  . Amoxicillin-Pot Clavulanate Other (See Comments)    Unknown     Allergies as of 08/26/2017      Reactions   Morphine And Related Nausea Only   Amoxicillin-pot Clavulanate Other (See Comments)   Unknown       Medication List        Accurate as of  08/26/17 11:10 AM. Always use your most recent med list.          CITRACAL PETITES/VITAMIN D 200-250 MG-UNIT Tabs Generic drug:  Calcium Citrate-Vitamin D Take 1 tablet by mouth daily.   letrozole 2.5 MG tablet Commonly known as:  FEMARA Take 1 tablet (2.5 mg total) by mouth daily.   meloxicam 15 MG tablet Commonly known as:  MOBIC Take 15 mg by mouth as needed for pain.   metoprolol succinate 100 MG 24 hr tablet Commonly known as:  TOPROL-XL TAKE 1/2 (ONE-HALF) TABLET BY MOUTH EVERY MORNING. Take with or immediately following a meal.   PENNSAID 2 % Soln Generic drug:  Diclofenac Sodium Place onto the skin. Apply topical twice a day as  needed   diclofenac sodium 1 % Gel Commonly known as:  VOLTAREN Apply topically 3 (three) times daily as needed.   predniSONE 5 MG tablet Commonly known as:  DELTASONE Take 5 mg by mouth daily with breakfast. Reported on 11/19/2015   traMADol 50 MG tablet Commonly known as:  ULTRAM Take 50 mg by mouth every 6 (six) hours as needed.   TURMERIC PO Take 1 capsule by mouth daily.       Review of Systems  Immunization History  Administered Date(s) Administered  . Influenza Split 07/24/2011, 06/20/2012  . Influenza Whole 10/19/2001, 07/25/2007, 08/02/2008, 07/18/2009, 07/10/2010  . Influenza,inj,Quad PF,6+ Mos 07/18/2013, 08/27/2014  . Influenza-Unspecified 07/31/2015, 08/19/2016, 08/09/2017  . Pneumococcal Polysaccharide-23 10/19/1997, 01/17/2010  . Td 10/19/1996, 07/25/2007   Pertinent  Health Maintenance Due  Topic Date Due  . PNA vac Low Risk Adult (2 of 2 - PCV13) 01/18/2011  . INFLUENZA VACCINE  Completed  . DEXA SCAN  Completed   Fall Risk  07/01/2017 02/22/2017 05/22/2015 03/22/2015 08/02/2014  Falls in the past year? No No No No No   Functional Status Survey:    Vitals:   08/26/17 1048  BP: (!) 110/58  Pulse: 72  Resp: 20  Temp: 98.4 F (36.9 C)  Weight: 123 lb (55.8 kg)  Height: 5' (1.524 m)   Body mass index is 24.02 kg/m. Physical Exam  Labs reviewed: Recent Labs    11/29/16 1749  06/09/17 1447 07/08/17 1408 08/04/17 1447  NA 136   < > 137 135* 134*  K 4.1   < > 4.1 4.1 4.9  CL 100*  --  103  --   --   CO2 30   < > 26 27 27   GLUCOSE 100*   < > 154* 84 147*  BUN 12   < > 18 12.0 13.9  CREATININE 0.61   < > 0.88 0.8 0.8  CALCIUM 9.4   < > 9.3 9.3 9.0   < > = values in this interval not displayed.   Recent Labs    06/09/17 1447 07/08/17 1408 08/04/17 1447  AST 27 23 27   ALT 19 16 19   ALKPHOS 76 84 92  BILITOT 0.5 1.01 0.71  PROT 6.2* 6.4 6.4  ALBUMIN 3.4* 3.3* 3.3*   Recent Labs    06/09/17 1447 07/08/17 1408 08/04/17 1447  WBC 7.8  8.6 8.0  NEUTROABS 6.7* 6.6* 6.6*  HGB 12.2 12.5 12.6  HCT 37.7 37.5 39.0  MCV 91.7 90.1 90.7  PLT 213 250 252   Lab Results  Component Value Date   TSH 0.33 (L) 12/13/2012   Lab Results  Component Value Date   HGBA1C 5.4 02/23/2017   Lab Results  Component Value Date  CHOL 194 07/25/2007   HDL 62.7 07/25/2007   LDLCALC 118 (H) 07/25/2007   TRIG 69 07/25/2007   CHOLHDL 3.1 CALC 07/25/2007    Significant Diagnostic Results in last 30 days:  No results found.  Assessment/Plan  No problem-specific Assessment & Plan notes found for this encounter.   Family/ staff Communication: ***  Labs/tests ordered:    Time spend 25 minutes.

## 2017-08-26 NOTE — Assessment & Plan Note (Signed)
12/13/12 TSH 0.33, the patient denied change in appetite or frequent BM, stable blood pressures, denied palpitation, tremors in hands, anxiety, insomnia, or weight loss. Update TSH

## 2017-08-31 DIAGNOSIS — E039 Hypothyroidism, unspecified: Secondary | ICD-10-CM | POA: Diagnosis not present

## 2017-08-31 LAB — TSH: TSH: 1.07 (ref <=5.90)

## 2017-09-01 ENCOUNTER — Other Ambulatory Visit: Payer: Self-pay | Admitting: *Deleted

## 2017-09-01 ENCOUNTER — Other Ambulatory Visit (HOSPITAL_BASED_OUTPATIENT_CLINIC_OR_DEPARTMENT_OTHER): Payer: Medicare Other

## 2017-09-01 ENCOUNTER — Ambulatory Visit (HOSPITAL_BASED_OUTPATIENT_CLINIC_OR_DEPARTMENT_OTHER): Payer: Medicare Other

## 2017-09-01 VITALS — BP 110/60 | HR 70 | Temp 98.1°F | Resp 18

## 2017-09-01 DIAGNOSIS — Z5111 Encounter for antineoplastic chemotherapy: Secondary | ICD-10-CM

## 2017-09-01 DIAGNOSIS — C773 Secondary and unspecified malignant neoplasm of axilla and upper limb lymph nodes: Secondary | ICD-10-CM | POA: Diagnosis not present

## 2017-09-01 DIAGNOSIS — Z170 Estrogen receptor positive status [ER+]: Principal | ICD-10-CM

## 2017-09-01 DIAGNOSIS — C50811 Malignant neoplasm of overlapping sites of right female breast: Secondary | ICD-10-CM

## 2017-09-01 DIAGNOSIS — C50412 Malignant neoplasm of upper-outer quadrant of left female breast: Secondary | ICD-10-CM | POA: Diagnosis not present

## 2017-09-01 DIAGNOSIS — C50912 Malignant neoplasm of unspecified site of left female breast: Secondary | ICD-10-CM

## 2017-09-01 DIAGNOSIS — C7951 Secondary malignant neoplasm of bone: Secondary | ICD-10-CM

## 2017-09-01 LAB — COMPREHENSIVE METABOLIC PANEL
ALT: 21 U/L (ref 0–55)
ANION GAP: 10 meq/L (ref 3–11)
AST: 28 U/L (ref 5–34)
Albumin: 3.5 g/dL (ref 3.5–5.0)
Alkaline Phosphatase: 81 U/L (ref 40–150)
BILIRUBIN TOTAL: 1.02 mg/dL (ref 0.20–1.20)
BUN: 15 mg/dL (ref 7.0–26.0)
CALCIUM: 9.7 mg/dL (ref 8.4–10.4)
CO2: 27 meq/L (ref 22–29)
Chloride: 97 mEq/L — ABNORMAL LOW (ref 98–109)
Creatinine: 1 mg/dL (ref 0.6–1.1)
EGFR: 53 mL/min/{1.73_m2} — AB (ref >60)
Glucose: 198 mg/dl — ABNORMAL HIGH (ref 70–140)
Potassium: 4.1 mEq/L (ref 3.5–5.1)
Sodium: 134 mEq/L — ABNORMAL LOW (ref 136–145)
TOTAL PROTEIN: 6.8 g/dL (ref 6.4–8.3)

## 2017-09-01 LAB — CBC WITH DIFFERENTIAL/PLATELET
BASO%: 0.3 % (ref 0.0–2.0)
Basophils Absolute: 0 10*3/uL (ref 0.0–0.1)
EOS ABS: 0 10*3/uL (ref 0.0–0.5)
EOS%: 0.5 % (ref 0.0–7.0)
HCT: 39.4 % (ref 34.8–46.6)
HGB: 13.1 g/dL (ref 11.6–15.9)
LYMPH%: 6.6 % — AB (ref 14.0–49.7)
MCH: 29.5 pg (ref 25.1–34.0)
MCHC: 33.2 g/dL (ref 31.5–36.0)
MCV: 88.9 fL (ref 79.5–101.0)
MONO#: 0.5 10*3/uL (ref 0.1–0.9)
MONO%: 5.4 % (ref 0.0–14.0)
NEUT%: 87.2 % — AB (ref 38.4–76.8)
NEUTROS ABS: 7.4 10*3/uL — AB (ref 1.5–6.5)
PLATELETS: 265 10*3/uL (ref 145–400)
RBC: 4.43 10*6/uL (ref 3.70–5.45)
RDW: 14.4 % (ref 11.2–14.5)
WBC: 8.5 10*3/uL (ref 3.9–10.3)
lymph#: 0.6 10*3/uL — ABNORMAL LOW (ref 0.9–3.3)

## 2017-09-01 MED ORDER — FULVESTRANT 250 MG/5ML IM SOLN
500.0000 mg | Freq: Once | INTRAMUSCULAR | Status: AC
  Administered 2017-09-01: 500 mg via INTRAMUSCULAR
  Filled 2017-09-01: qty 10

## 2017-09-01 NOTE — Patient Instructions (Signed)

## 2017-09-15 ENCOUNTER — Other Ambulatory Visit: Payer: Medicare Other

## 2017-09-15 ENCOUNTER — Ambulatory Visit: Payer: Medicare Other

## 2017-09-15 ENCOUNTER — Ambulatory Visit (HOSPITAL_BASED_OUTPATIENT_CLINIC_OR_DEPARTMENT_OTHER): Payer: Medicare Other

## 2017-09-15 VITALS — BP 130/70 | HR 84 | Temp 98.3°F | Resp 18

## 2017-09-15 DIAGNOSIS — C50412 Malignant neoplasm of upper-outer quadrant of left female breast: Secondary | ICD-10-CM | POA: Diagnosis not present

## 2017-09-15 DIAGNOSIS — Z170 Estrogen receptor positive status [ER+]: Principal | ICD-10-CM

## 2017-09-15 DIAGNOSIS — M818 Other osteoporosis without current pathological fracture: Secondary | ICD-10-CM | POA: Diagnosis present

## 2017-09-15 MED ORDER — ZOLEDRONIC ACID 4 MG/5ML IV CONC
3.3000 mg | Freq: Once | INTRAVENOUS | Status: AC
  Administered 2017-09-15: 3.3 mg via INTRAVENOUS
  Filled 2017-09-15: qty 4.13

## 2017-09-15 MED ORDER — SODIUM CHLORIDE 0.9 % IV SOLN
INTRAVENOUS | Status: DC
  Administered 2017-09-15: 15:00:00 via INTRAVENOUS

## 2017-09-15 NOTE — Patient Instructions (Signed)

## 2017-09-29 ENCOUNTER — Ambulatory Visit (HOSPITAL_BASED_OUTPATIENT_CLINIC_OR_DEPARTMENT_OTHER): Payer: Medicare Other

## 2017-09-29 ENCOUNTER — Other Ambulatory Visit (HOSPITAL_BASED_OUTPATIENT_CLINIC_OR_DEPARTMENT_OTHER): Payer: Medicare Other

## 2017-09-29 VITALS — BP 135/50 | HR 70 | Temp 97.9°F | Resp 16

## 2017-09-29 DIAGNOSIS — C50811 Malignant neoplasm of overlapping sites of right female breast: Secondary | ICD-10-CM

## 2017-09-29 DIAGNOSIS — Z5111 Encounter for antineoplastic chemotherapy: Secondary | ICD-10-CM | POA: Diagnosis present

## 2017-09-29 DIAGNOSIS — C7951 Secondary malignant neoplasm of bone: Secondary | ICD-10-CM

## 2017-09-29 DIAGNOSIS — C50412 Malignant neoplasm of upper-outer quadrant of left female breast: Secondary | ICD-10-CM | POA: Diagnosis present

## 2017-09-29 DIAGNOSIS — C50912 Malignant neoplasm of unspecified site of left female breast: Secondary | ICD-10-CM

## 2017-09-29 DIAGNOSIS — C773 Secondary and unspecified malignant neoplasm of axilla and upper limb lymph nodes: Secondary | ICD-10-CM

## 2017-09-29 DIAGNOSIS — Z170 Estrogen receptor positive status [ER+]: Principal | ICD-10-CM

## 2017-09-29 LAB — CBC WITH DIFFERENTIAL/PLATELET
BASO%: 0.3 % (ref 0.0–2.0)
BASOS ABS: 0 10*3/uL (ref 0.0–0.1)
EOS%: 0.4 % (ref 0.0–7.0)
Eosinophils Absolute: 0 10*3/uL (ref 0.0–0.5)
HEMATOCRIT: 41.3 % (ref 34.8–46.6)
HEMOGLOBIN: 13.4 g/dL (ref 11.6–15.9)
LYMPH#: 0.7 10*3/uL — AB (ref 0.9–3.3)
LYMPH%: 7.8 % — ABNORMAL LOW (ref 14.0–49.7)
MCH: 29.4 pg (ref 25.1–34.0)
MCHC: 32.4 g/dL (ref 31.5–36.0)
MCV: 90.6 fL (ref 79.5–101.0)
MONO#: 0.5 10*3/uL (ref 0.1–0.9)
MONO%: 5.1 % (ref 0.0–14.0)
NEUT#: 7.8 10*3/uL — ABNORMAL HIGH (ref 1.5–6.5)
NEUT%: 86.4 % — AB (ref 38.4–76.8)
Platelets: 248 10*3/uL (ref 145–400)
RBC: 4.56 10*6/uL (ref 3.70–5.45)
RDW: 14.2 % (ref 11.2–14.5)
WBC: 9 10*3/uL (ref 3.9–10.3)

## 2017-09-29 LAB — COMPREHENSIVE METABOLIC PANEL
ALBUMIN: 3.6 g/dL (ref 3.5–5.0)
ALT: 22 U/L (ref 0–55)
AST: 28 U/L (ref 5–34)
Alkaline Phosphatase: 89 U/L (ref 40–150)
Anion Gap: 11 mEq/L (ref 3–11)
BUN: 14.9 mg/dL (ref 7.0–26.0)
CHLORIDE: 99 meq/L (ref 98–109)
CO2: 27 mEq/L (ref 22–29)
CREATININE: 1 mg/dL (ref 0.6–1.1)
Calcium: 9.3 mg/dL (ref 8.4–10.4)
EGFR: 50 mL/min/{1.73_m2} — ABNORMAL LOW (ref >60)
GLUCOSE: 148 mg/dL — AB (ref 70–140)
POTASSIUM: 4.1 meq/L (ref 3.5–5.1)
Sodium: 136 mEq/L (ref 136–145)
Total Bilirubin: 1.12 mg/dL (ref 0.20–1.20)
Total Protein: 7 g/dL (ref 6.4–8.3)

## 2017-09-29 MED ORDER — FULVESTRANT 250 MG/5ML IM SOLN
INTRAMUSCULAR | Status: AC
  Filled 2017-09-29: qty 5

## 2017-09-29 MED ORDER — FULVESTRANT 250 MG/5ML IM SOLN
500.0000 mg | Freq: Once | INTRAMUSCULAR | Status: AC
  Administered 2017-09-29: 500 mg via INTRAMUSCULAR

## 2017-10-15 ENCOUNTER — Other Ambulatory Visit: Payer: Self-pay | Admitting: Oncology

## 2017-10-15 ENCOUNTER — Encounter (HOSPITAL_COMMUNITY)
Admission: RE | Admit: 2017-10-15 | Discharge: 2017-10-15 | Disposition: A | Discharge status: Home / Self Care | Payer: Medicare Other | Source: Ambulatory Visit | Attending: Oncology | Admitting: Oncology

## 2017-10-15 DIAGNOSIS — Z170 Estrogen receptor positive status [ER+]: Secondary | ICD-10-CM | POA: Insufficient documentation

## 2017-10-15 DIAGNOSIS — C50811 Malignant neoplasm of overlapping sites of right female breast: Secondary | ICD-10-CM

## 2017-10-15 DIAGNOSIS — C7951 Secondary malignant neoplasm of bone: Secondary | ICD-10-CM | POA: Insufficient documentation

## 2017-10-15 DIAGNOSIS — C3492 Malignant neoplasm of unspecified part of left bronchus or lung: Secondary | ICD-10-CM | POA: Diagnosis not present

## 2017-10-15 DIAGNOSIS — C50911 Malignant neoplasm of unspecified site of right female breast: Secondary | ICD-10-CM | POA: Diagnosis not present

## 2017-10-15 DIAGNOSIS — C50912 Malignant neoplasm of unspecified site of left female breast: Secondary | ICD-10-CM | POA: Diagnosis not present

## 2017-10-15 DIAGNOSIS — C50412 Malignant neoplasm of upper-outer quadrant of left female breast: Secondary | ICD-10-CM

## 2017-10-15 LAB — GLUCOSE, CAPILLARY: GLUCOSE-CAPILLARY: 75 mg/dL (ref 65–99)

## 2017-10-15 MED ORDER — FLUDEOXYGLUCOSE F - 18 (FDG) INJECTION
6.6800 | Freq: Once | INTRAVENOUS | Status: AC | PRN
  Administered 2017-10-15: 6.68 via INTRAVENOUS

## 2017-10-15 NOTE — Progress Notes (Unsigned)
[facility]  [phone] [phone]   ID: Lauren Buchanan DOB: 01-Nov-1924  MR#: [id number]  UUE#:[id number]  Patient Care Team: Lauren Serve, MD as PCP - General (Internal Medicine) Lauren Skates, MD as Consulting Physician (General Surgery) , Lauren Dad, MD as Consulting Physician (Oncology) Lauren Rudd, MD as Consulting Physician (Radiation Oncology) Lauren Germany, RN as Registered Nurse Lauren Kaufmann, RN as Registered Nurse Lauren Fitz, MD as Consulting Physician (Family Medicine) Lauren Buchanan, Lauren X, NP as Nurse Practitioner (Internal Medicine) PCP: Lauren Serve, MD GYN: OTHER MD: Danton Sewer MD, Thompson Grayer MD, Unice Bailey MD, Rolm Bookbinder MD  CHIEF COMPLAINT: bilateral estrogen receptor positive breast cancer, metastatic to bone  CURRENT TREATMENT: fulvestrant, zolendronate, letrozole  NOTE: Patient requests her daughter Lauren Buchanan be contacted regarding all appointments and procedures. Lauren Buchanan can be reached at [phone]  INTERVAL HISTORY: Lauren Buchanan returns today for follow-up and treatment of her estrogen receptor positive breast cancer. She is accompanied by her daughter. She continues on letrozole, which she tolerates well overall. She is not having hot flashes or increase in vaginal dryness.   Lauren Buchanan also receives fulvestrant every 4 weeks, with a dose due today. She reports that with the last administration she had increased soreness to the injection site.    She receives zolendronate every 3 months with her next infusion being in November.  REVIEW OF SYSTEMS: Lauren Buchanan reports right breast pain. She continues to forget that she has cancer in the periareolar area of the right breast and wonders why it is different from the left. She denies unusual headaches, visual changes, nausea, vomiting, or dizziness. There has been no unusual cough, phlegm production, or pleurisy. This been no change in bowel or bladder habits. She denies  unexplained fatigue or unexplained weight loss, bleeding, rash, or fever. A detailed review of systems was otherwise negative.     BREAST CANCER HISTORY: From the original intake note:  "Lauren Buchanan"  Has been aware of a sore right nipple for the last 6-8 months. She attributed it to her washing machine, which rubs that area when she uses it. On  11/21/2014 she saw Dr. 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 She mentioned the chest wall problem and Dr. Ubaldo Glassing obtained to skin biopsies 1 from the right inferior central chest and the other one of from the right mid medial chest.  The second of these showed a nodular basal cell carcinoma. The first one however showed invasive carcinoma consistent with a breast primary. This tumor was estrogen receptor positive at 100%, progesterone receptor positive at 36%, both with strong staining intensity, and HER-2 was amplified , the signals ratio being 6.2. [name] [id number])  On 12/06/2014 the patient underwent bilateral diagnostic mammography with bilateral ultrasonography. Breast density was category B. There was bilateral nipple retraction. In the left breast upper outer quadrant there was a spiculated mass noted by mammography with a second spiculated mass in the lower inner quadrant. By ultrasound there was a 1.6 cm hypoechoic lesion at the 1:30 o'clock position in the left breast, and a second mass more medially measuring 0.9 cm. Also at the 8:00 position in the left breast 2 cm from the nipple there was a 1.3 cm mass. Ultrasound of the left axilla showed an enlarged lymph node with fatty hilum replacement, measuring 1.4 cm.  In the right breast mammography showed a cluster of indeterminate microcalcifications in the upper outer quadrant and a retroareolar mass, which bile  does sound measured 2.9 cm. Ultrasound of the right axilla showed a right axilla lymph node measuring 1.4 cm.  On 12/06/2014 the patient underwent needle core biopsy of 2  separate masses in the left breast (at 1:00 and 8:00) as well as the enlarged axillary lymph node. All 3 were positive for an invasive lobular carcinoma (E-cadherin negative). All 3 tumors were estrogen receptor positive with strong staining intensity (between 87% and 100%). The lymph node was progesterone receptor negative, the other 2 masses being progesterone receptor positive at 9% and at 76%. This cancer was reported to be HER-2 positive at conference today, though I do not have the ratios at hand.  The patient is not an MRI candidate because she has a permanent pacemaker in place. Her subsequent history is as detailed below    PAST MEDICAL HISTORY: Past Medical History:  Diagnosis Date  . Asthma   . Breast cancer ([facility]) 12/08/2014   Bilateral  . Cancer of central portion of female breast ([facility]) 12/04/2014  . Cancer of central portion of female breast ([facility]) 12/04/2014  . Complete heart block ([facility]) 1999   s/p PPM by Dr Olevia Perches, most recent generator change 2009  . COPD (chronic obstructive pulmonary disease) ([facility])   . Diverticulosis   . DJD (degenerative joint disease)   . Esophageal stricture   . GERD (gastroesophageal reflux disease)   . Giant cell arteritis ([facility])   . Goiter   . Hemorrhoids   . Hiatal hernia   . IBS (irritable bowel syndrome)   . Lung cancer ([facility])   . Paroxysmal atrial fibrillation ([facility]) 12/17/2015   asymptomatic, <1%, determined on regular pacemaker interrogation  . Pneumonia   . Renal cyst   . S/P radiation therapy 04/24/15 completed   T-11  . Wears glasses     PAST SURGICAL HISTORY: Past Surgical History:  Procedure Laterality Date  . ANAL FISSURE REPAIR    . ARTERY BIOPSY Left 01/03/2013   Procedure: BIOPSY LEFT TEMPORAL ARTERY;  Surgeon: Ascencion Dike, MD;  Location: [facility];  Service: ENT;  Laterality: Left;  . BREAST BIOPSY Right   . CHOLECYSTECTOMY    . COLONOSCOPY    . FRACTURE SURGERY     fx lt wrist  . hemorrhoidectomy    . left  lower lobectomy for brochiectasis  1950  . MOUTH SURGERY    . PACEMAKER INSERTION  1999   Gen change ([facility]) by Dr Olevia Perches 2009  . RECTAL POLYPECTOMY    . RENAL CYST EXCISION    . TONSILLECTOMY      FAMILY HISTORY Family History  Problem Relation Age of Onset  . Heart disease Mother   . Breast cancer Mother   . Stomach cancer Father   . Alcohol abuse Father   . Seizures Son   . Thyroid disease Sister    the patient's father died at the age of 3, with stomach cancer. The patient's mother died at the age of 38 from a myocardial infarction. She had been diagnosed with breast cancer in her 64s. The patient had no brothers, 2 sisters. There is no other history of breast or ovarian cancer in the family to the patient's knowledge  GYNECOLOGIC HISTORY:  No LMP recorded. Patient is postmenopausal. Menarche age 78, first live birth age 67. The patient is GX P4. She underwent menopause in her late 57s. She did not take hormone replacement.  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 She is a widow, lives by herself, with no pets. Daughter Wendy Poet lives in [place] where she is Mudlogger of a preschool and day care. Son Adriann Thau lives in [place] where he owns a furniture to sign store. Daughter Shelly Rubenstein lives in [place]. She owns a Chiropractor. Son Randall Hiss is physically and mentally disabled and lives in a group home in [place]. The patient has 7 grandchildren and 2 great-grandchildren. She attends a local [employer]: In place; the patient does not wish to be resuscitated in case of a terminal event; the patient's daughter Lauren Buchanan is her healthcare power of attorney. She can be reached at [phone]. The patient has a DO NOT RESUSCITATE order in place at [facility].  HEALTH MAINTENANCE: Social History   Tobacco Use  . Smoking status: Never Smoker  . Smokeless tobacco: Never Used  Substance Use Topics  . Alcohol use:  Yes    Alcohol/week: 0.0 oz    Comment: Occasional   . Drug use: No     Colonoscopy:  PAP:  Bone density:02/19/2015, osteoporosis  Lipid panel:  Allergies  Allergen Reactions  . Morphine And Related Nausea Only  . Amoxicillin-Pot Clavulanate Other (See Comments)    Unknown     Current Outpatient Medications  Medication Sig Dispense Refill  . Calcium Citrate-Vitamin D (CITRACAL PETITES/VITAMIN D) 200-250 MG-UNIT TABS Take 1 tablet by mouth daily.      . diclofenac sodium (VOLTAREN) 1 % GEL Apply topically 3 (three) times daily as needed.    . lactose free nutrition (BOOST) LIQD Take 237 mLs daily by mouth.    . letrozole (FEMARA) 2.5 MG tablet Take 1 tablet (2.5 mg total) by mouth daily. 90 tablet 4  . metoprolol succinate (TOPROL-XL) 100 MG 24 hr tablet TAKE 1/2 (ONE-HALF) TABLET BY MOUTH EVERY MORNING. Take with or immediately following a meal. 45 tablet 3  . predniSONE (DELTASONE) 5 MG tablet Take 5 mg by mouth daily with breakfast. Reported on 11/19/2015    . traMADol (ULTRAM) 50 MG tablet Take 50 mg by mouth every 6 (six) hours as needed.     . TURMERIC PO Take 1 capsule by mouth daily.     No current facility-administered medications for this visit.     OBJECTIVE: Elderly white woman Who appears stated age  There were no vitals filed for this visit.   There is no height or weight on file to calculate BMI.    ECOG FS:1 - Symptomatic but completely ambulatory  Sclerae unicteric, pupils round and equal Oropharynx clear and moist No cervical or supraclavicular adenopathy Lungs no rales or rhonchi Heart regular rate and rhythm Abd soft, nontender, positive bowel sounds MSK no focal spinal tenderness, no upper extremity lymphedema Neuro: nonfocal, well oriented, appropriate affect Breasts: The right breast nipple area is imaged below. It is unchanged from baseline. The left breast is benign. Both axillae are benign.  Right breast 08/04/2017    LAB RESULTS:  CMP       Component Value Date/Time   NA 136 09/29/2017 1435   K 4.1 09/29/2017 1435   CL 103 06/09/2017 1447   CO2 27 09/29/2017 1435   GLUCOSE 148 (H) 09/29/2017 1435   BUN 14.9 09/29/2017 1435   CREATININE 1.0 09/29/2017 1435   CALCIUM 9.3 09/29/2017 1435   PROT 7.0 09/29/2017 1435   ALBUMIN 3.6 09/29/2017 1435   AST 28 09/29/2017 1435   ALT 22 09/29/2017  1435   ALKPHOS 89 09/29/2017 1435   BILITOT 1.12 09/29/2017 1435   GFRNONAA 57 (L) 06/09/2017 1447   GFRAA >60 06/09/2017 1447    INo results found for: SPEP, UPEP  Lab Results  Component Value Date   WBC 9.0 09/29/2017   NEUTROABS 7.8 (H) 09/29/2017   HGB 13.4 09/29/2017   HCT 41.3 09/29/2017   MCV 90.6 09/29/2017   PLT 248 09/29/2017      Chemistry      Component Value Date/Time   NA 136 09/29/2017 1435   K 4.1 09/29/2017 1435   CL 103 06/09/2017 1447   CO2 27 09/29/2017 1435   BUN 14.9 09/29/2017 1435   CREATININE 1.0 09/29/2017 1435   GLU 94 02/23/2017      Component Value Date/Time   CALCIUM 9.3 09/29/2017 1435   ALKPHOS 89 09/29/2017 1435   AST 28 09/29/2017 1435   ALT 22 09/29/2017 1435   BILITOT 1.12 09/29/2017 1435       Lab Results  Component Value Date   LABCA2 20 04/01/2016    No components found for: PRFFM384  No results for input(s): INR in the last 168 hours.  Urinalysis    Component Value Date/Time   COLORURINE COLORLESS (A) 11/29/2016 1707   APPEARANCEUR CLEAR 11/29/2016 1707   LABSPEC 1.002 (L) 11/29/2016 1707   PHURINE 7.0 11/29/2016 1707   GLUCOSEU NEGATIVE 11/29/2016 1707   HGBUR SMALL (A) 11/29/2016 1707   HGBUR trace-intact 01/17/2010 0932   BILIRUBINUR NEGATIVE 11/29/2016 1707   BILIRUBINUR n 09/01/2011 1157   KETONESUR NEGATIVE 11/29/2016 1707   PROTEINUR NEGATIVE 11/29/2016 1707   UROBILINOGEN 0.2 09/27/2014 1800   NITRITE NEGATIVE 11/29/2016 1707   LEUKOCYTESUR NEGATIVE 11/29/2016 1707    STUDIES: She had been scheduled for bilateral breast ultrasonography after  the last visit but for some reason that was never scheduled.  ASSESSMENT: 83 y.o. [place] woman with bilateral breast cancer, as follows:  (1) status post right breastt overlapping sites skin biopsy 11/21/2014 for a clinical T4 N1, stage IIIB invasive ductal carcinoma, estrogen and progesterone receptor positive, HER-2 amplified  (2) status post 2 separate left breast upper outer quadrant biopsies and one left axillary lymph node biopsy 12/04/2014, all positive for a clinical mT1c N1, stage IIA invasive lobular carcinoma, estrogen receptor positive, progesterone receptor variable, with MIB-1 between 10 and 40%, and no HER-2 amplification  (3) neoadjuvant anastrozole started 12/14/2014. Discontinued with progression by repeat left breast US May 2017  (4) definitive surgery to consist of bilateral mastectomies without formal axillary dissection: Postponed  METASTATIC DISEASE: March 2016 (5) PET scan 12/19/2014 shows in addition to the bilateral breast and bilateral axillary masses, a lesion at the T11 vertebral body, showing an irregular lucency on prior CT exam  (a) adjuvant radiationt to T10-T12: 04/11/2015 through 04/24/2015 to a total dose of 30 gray in 10 fractions  (6) consider anti-HER-2 treatment depending on response to antiestrogen therapy alone.    (7) osteoporosis with T score of -2.6 on bone scan obtained 02/19/2015.  (a) zolendronate started 03/20/2015, repeated every 12 weeks   (8) fulvestrant started 03/04/16  (a) patient refused adding palbociclib  (b) PET scan in 10/15/2016 showed mild increase in the right breast mass and increased size and activity of 3.2 cm of solid left lung nodule  (c) letrozole added 10/16/2016  (d) PET scan obtained 02/10/2017 showing a significant decrease in SUV in the measurable disease  (9) Doppler ultrasound both lower extremities 11/17/2016 and repeat  11/29/2016 are  negative  PLAN: Lauren Buchanan is now over 2 years out from definitive diagnosis of  metastatic disease, with very good disease control clinically.  She forgets that she has breast cancer in the right breast and specifically the right nipple area. When she notices this she wonders why the left breast doesn't have the same problem. She does have some pain associated with this at times. She requests pain medication and she receives it, possibly meloxicam or similar. She denies any significant change overall however. Her daughter concurs.  I don't know why she had more pain with the last fulvestrant dose. Hopefully that will not be the case today.  By my exam the right breast is very stable if not slightly improved. Accordingly I am comfortable with her not having had the ultrasound that she requested last visit.  She will continue the fulvestrant every 4 weeks. She will have her zolendronate in November. She is going to return to see me in January and at the very end of December she will have a restaging PET scan  They know to call for any problems that may develop before the next visit.  , Lauren Dad, MD  10/15/17 5:42 PM Medical Oncology and Hematology [facility] [address] [place], [place] [address] Tel. [phone]    Fax. [phone]  This document serves as a record of services personally performed by Lurline Del, MD. It was created on her behalf by Steva Colder, a trained medical scribe. The creation of this record is based on the scribe's personal observations and the provider's statements to them. This document has been checked and approved by the attending provider.

## 2017-10-26 NOTE — Progress Notes (Signed)
Lauren Buchanan  [phone] Fax:([phone]) [phone]   ID: Lauren Buchanan DOB: Jun 19, 1941  MR#: [id number]  WER#:[id number]  Patient Care Team: Blanchie Serve, MD as PCP - General (Internal Medicine) Fanny Skates, MD as Consulting Physician (General Surgery) Ova Meegan, [name], MD as Consulting Physician (Oncology) [name], MD as Consulting Physician (Radiation Oncology) Rockwell Germany, RN as Registered Nurse Mauro Kaufmann, RN as Registered Nurse Gentry Fitz, MD as Consulting Physician (Family Medicine) [name], [name], NP as Nurse Practitioner (Internal Medicine) PCP: Blanchie Serve, MD GYN: OTHER MD: Danton Sewer MD, Thompson Grayer MD, Unice Bailey MD, Rolm Bookbinder MD  CHIEF COMPLAINT: bilateral estrogen receptor positive breast cancer, metastatic to bone  CURRENT TREATMENT: zolendronate, capecitabine, trastuzumab  NOTE: Patient requests her daughter Lauren Buchanan be contacted regarding all appointments and procedures. [name] can be reached at [phone]  INTERVAL HISTORY: Lauren Buchanan returns today for follow-up and treatment of her estrogen receptor positive breast cancer accompanied by her daughter. She continues on letrozole, with good tolerance.  Since her last visit, she completed a PET scan on 10/15/2017 showing: Enlarging, increasingly hypermetabolic bilateral breast masses. Right axillary and subpectoral lymph nodes have not significantly increased in size, but are also slightly more hypermetabolic. No distant metastases. Stable sub solid left lung lesion with mild hypermetabolic activity.   REVIEW OF SYSTEMS: Lauren Buchanan reports that she enjoyed the holidays and spent the night with her daughter. She notes that she baked while she was there. She reports that she is having trouble remembering more frequently. She notes that she is eating well. She reports that she has been having pain in both of her breasts. She reports some scabbing on one of the breasts. She notes  that her breathing is fine. For exercise, she walks to the cafeteria about 3 times a day. She notes that it takes about 10 minutes to get there. This is a total of about 30-60 minutes per day. She denies unusual headaches, visual changes, nausea, vomiting, or dizziness. There has been no unusual cough, phlegm production, or pleurisy. This been no change in bowel or bladder habits. She denies unexplained fatigue or unexplained weight loss, bleeding, rash, or fever. A detailed review of systems was otherwise stable.    BREAST CANCER HISTORY: From the original intake note:  "Lauren Buchanan"  Has been aware of a sore right nipple for the last 6-8 months. She attributed it to her washing machine, which rubs that area when she uses it. On  11/21/2014 she saw Dr. 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 She mentioned the chest wall problem and Dr. Ubaldo Glassing obtained to skin biopsies 1 from the right inferior central chest and the other one of from the right mid medial chest.  The second of these showed a nodular basal cell carcinoma. The first one however showed invasive carcinoma consistent with a breast primary. This tumor was estrogen receptor positive at 100%, progesterone receptor positive at 36%, both with strong staining intensity, and HER-2 was amplified , the signals ratio being 6.2. [name] [id number])  On 12/06/2014 the patient underwent bilateral diagnostic mammography with bilateral ultrasonography. Breast density was category B. There was bilateral nipple retraction. In the left breast upper outer quadrant there was a spiculated mass noted by mammography with a second spiculated mass in the lower inner quadrant. By ultrasound there was a 1.6 cm hypoechoic lesion at the 1:30 o'clock position in the left breast, and a second mass more medially measuring  0.9 cm. Also at the 8:00 position in the left breast 2 cm from the nipple there was a 1.3 cm mass. Ultrasound of the left axilla showed an  enlarged lymph node with fatty hilum replacement, measuring 1.4 cm.  In the right breast mammography showed a cluster of indeterminate microcalcifications in the upper outer quadrant and a retroareolar mass, which bile does sound measured 2.9 cm. Ultrasound of the right axilla showed a right axilla lymph node measuring 1.4 cm.  On 12/06/2014 the patient underwent needle core biopsy of 2 separate masses in the left breast (at 1:00 and 8:00) as well as the enlarged axillary lymph node. All 3 were positive for an invasive lobular carcinoma (E-cadherin negative). All 3 tumors were estrogen receptor positive with strong staining intensity (between 87% and 100%). The lymph node was progesterone receptor negative, the other 2 masses being progesterone receptor positive at 9% and at 76%. This cancer was reported to be HER-2 positive at conference today, though I do not have the ratios at hand.  The patient is not an MRI candidate because she has a permanent pacemaker in place. Her subsequent history is as detailed below    PAST MEDICAL HISTORY: Past Medical History:  Diagnosis Date  . Asthma   . Breast cancer ([place]) 12/08/2014   Bilateral  . Cancer of central portion of female breast ([place]) 12/04/2014  . Cancer of central portion of female breast ([place]) 12/04/2014  . Complete heart block ([place]) 1999   s/p PPM by Dr Olevia Perches, most recent generator change 2009  . COPD (chronic obstructive pulmonary disease) ([place])   . Diverticulosis   . DJD (degenerative joint disease)   . Esophageal stricture   . GERD (gastroesophageal reflux disease)   . Giant cell arteritis ([place])   . Goiter   . Hemorrhoids   . Hiatal hernia   . IBS (irritable bowel syndrome)   . Lung cancer ([place])   . Paroxysmal atrial fibrillation ([place]) 12/17/2015   asymptomatic, <1%, determined on regular pacemaker interrogation  . Pneumonia   . Renal cyst   . S/P radiation therapy 04/24/15 completed   T-11  . Wears glasses     PAST SURGICAL  HISTORY: Past Surgical History:  Procedure Laterality Date  . ANAL FISSURE REPAIR    . ARTERY BIOPSY Left 01/03/2013   Procedure: BIOPSY LEFT TEMPORAL ARTERY;  Surgeon: Ascencion Dike, MD;  Location: [facility];  Service: ENT;  Laterality: Left;  . BREAST BIOPSY Right   . CHOLECYSTECTOMY    . COLONOSCOPY    . FRACTURE SURGERY     fx lt wrist  . hemorrhoidectomy    . left lower lobectomy for brochiectasis  1950  . MOUTH SURGERY    . PACEMAKER INSERTION  1999   Gen change ([facility]) by Dr Olevia Perches 2009  . RECTAL POLYPECTOMY    . RENAL CYST EXCISION    . TONSILLECTOMY      FAMILY HISTORY Family History  Problem Relation Age of Onset  . Heart disease Mother   . Breast cancer Mother   . Stomach cancer Father   . Alcohol abuse Father   . Seizures Son   . Thyroid disease Sister    the patient's father died at the age of 25, with stomach cancer. The patient's mother died at the age of 60 from a myocardial infarction. She had been diagnosed with breast cancer in her 72s. The patient had no brothers, 2 sisters. There is no other history  of breast or ovarian cancer in the family to the patient's knowledge  GYNECOLOGIC HISTORY:  No LMP recorded. Patient is postmenopausal. Menarche age 30, first live birth age 24. The patient is GX P4. She underwent menopause in her late 22s. She did not take hormone replacement.  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 She is a widow, lives by herself, with no pets. Daughter Wendy Poet lives in [place] where she is Mudlogger of a preschool and day care. Son Cyleigh Massaro lives in [place] where he owns a furniture to sign store. Daughter Shelly Rubenstein lives in [place]. She owns a Chiropractor. Son Randall Hiss is physically and mentally disabled and lives in a group home in [place]. The patient has 7 grandchildren and 2 great-grandchildren. She attends a local [place]: In place; the  patient does not wish to be resuscitated in case of a terminal event; the patient's daughter Lauren Buchanan is her healthcare power of attorney. She can be reached at [phone]. The patient has a DO NOT RESUSCITATE order in place at [facility].  HEALTH MAINTENANCE: Social History   Tobacco Use  . Smoking status: Never Smoker  . Smokeless tobacco: Never Used  Substance Use Topics  . Alcohol use: Yes    Alcohol/week: 0.0 oz    Comment: Occasional   . Drug use: No     Colonoscopy:  PAP:  Bone density:02/19/2015, osteoporosis  Lipid panel:  Allergies  Allergen Reactions  . Morphine And Related Nausea Only  . Amoxicillin-Pot Clavulanate Other (See Comments)    Unknown     Current Outpatient Medications  Medication Sig Dispense Refill  . Calcium Citrate-Vitamin D (CITRACAL PETITES/VITAMIN D) 200-250 MG-UNIT TABS Take 1 tablet by mouth daily.      . diclofenac sodium (VOLTAREN) 1 % GEL Apply topically 3 (three) times daily as needed.    . lactose free nutrition (BOOST) LIQD Take 237 mLs daily by mouth.    . letrozole (FEMARA) 2.5 MG tablet Take 1 tablet (2.5 mg total) by mouth daily. 90 tablet 4  . metoprolol succinate (TOPROL-XL) 100 MG 24 hr tablet TAKE 1/2 (ONE-HALF) TABLET BY MOUTH EVERY MORNING. Take with or immediately following a meal. 45 tablet 3  . predniSONE (DELTASONE) 5 MG tablet Take 5 mg by mouth daily with breakfast. Reported on 11/19/2015    . traMADol (ULTRAM) 50 MG tablet Take 50 mg by mouth every 6 (six) hours as needed.     . TURMERIC PO Take 1 capsule by mouth daily.     No current facility-administered medications for this visit.    Facility-Administered Medications Ordered in Other Visits  Medication Dose Route Frequency Provider Last Rate Last Dose  . fulvestrant (FASLODEX) injection 500 mg  500 mg Intramuscular Once Tishana Clinkenbeard, [name], MD        OBJECTIVE: Elderly white woman in no acute distress  Vitals:   10/27/17 1510  BP: (!) 127/53  Pulse: 85  Resp: 18    Temp: 97.6 F (36.4 C)  SpO2: 97%     Body mass index is 24.26 kg/m.    ECOG FS:1 - Symptomatic but completely ambulatory  Sclerae unicteric, EOMs intact Oropharynx clear No cervical or supraclavicular adenopathy Lungs no rales or rhonchi Heart regular rate and rhythm Abd soft, nontender, positive bowel sounds MSK no focal spinal tenderness, no upper extremity lymphedema Neuro: nonfocal, well oriented, appropriate affect Breasts: See images below  Breasts: The right breast nipple area is imaged below. It is unchanged from baseline. The left breast is benign. Both axillae are benign.  Right Breast 10/27/2017    Left Breast 10/27/2017    Right breast 08/04/2017    LAB RESULTS:  CMP     Component Value Date/Time   NA 134 (L) 10/27/2017 1432   NA 136 09/29/2017 1435   K 4.6 10/27/2017 1432   K 4.1 09/29/2017 1435   CL 99 10/27/2017 1432   CO2 28 10/27/2017 1432   CO2 27 09/29/2017 1435   GLUCOSE 218 (H) 10/27/2017 1432   GLUCOSE 148 (H) 09/29/2017 1435   BUN 15 10/27/2017 1432   BUN 14.9 09/29/2017 1435   CREATININE 1.05 10/27/2017 1432   CREATININE 1.0 09/29/2017 1435   CALCIUM 10.4 10/27/2017 1432   CALCIUM 9.3 09/29/2017 1435   PROT 6.8 10/27/2017 1432   PROT 7.0 09/29/2017 1435   ALBUMIN 3.6 10/27/2017 1432   ALBUMIN 3.6 09/29/2017 1435   AST 27 10/27/2017 1432   AST 28 09/29/2017 1435   ALT 20 10/27/2017 1432   ALT 22 09/29/2017 1435   ALKPHOS 98 10/27/2017 1432   ALKPHOS 89 09/29/2017 1435   BILITOT 0.8 10/27/2017 1432   BILITOT 1.12 09/29/2017 1435   GFRNONAA 45 (L) 10/27/2017 1432   GFRAA 53 (L) 10/27/2017 1432    INo results found for: SPEP, UPEP  Lab Results  Component Value Date   WBC 10.5 (H) 10/27/2017   NEUTROABS 9.4 (H) 10/27/2017   HGB 13.6 10/27/2017   HCT 41.8 10/27/2017   MCV 90.9 10/27/2017   PLT 278 10/27/2017      Chemistry      Component Value Date/Time   NA 134 (L) 10/27/2017 1432   NA 136 09/29/2017 1435   K 4.6  10/27/2017 1432   K 4.1 09/29/2017 1435   CL 99 10/27/2017 1432   CO2 28 10/27/2017 1432   CO2 27 09/29/2017 1435   BUN 15 10/27/2017 1432   BUN 14.9 09/29/2017 1435   CREATININE 1.05 10/27/2017 1432   CREATININE 1.0 09/29/2017 1435   GLU 94 02/23/2017      Component Value Date/Time   CALCIUM 10.4 10/27/2017 1432   CALCIUM 9.3 09/29/2017 1435   ALKPHOS 98 10/27/2017 1432   ALKPHOS 89 09/29/2017 1435   AST 27 10/27/2017 1432   AST 28 09/29/2017 1435   ALT 20 10/27/2017 1432   ALT 22 09/29/2017 1435   BILITOT 0.8 10/27/2017 1432   BILITOT 1.12 09/29/2017 1435       Lab Results  Component Value Date   LABCA2 20 04/01/2016    No components found for: [id number]  No results for input(s): INR in the last 168 hours.  Urinalysis    Component Value Date/Time   COLORURINE COLORLESS (A) 11/29/2016 1707   APPEARANCEUR CLEAR 11/29/2016 1707   LABSPEC 1.002 (L) 11/29/2016 1707   PHURINE 7.0 11/29/2016 1707   GLUCOSEU NEGATIVE 11/29/2016 1707   HGBUR SMALL (A) 11/29/2016 1707   HGBUR trace-intact 01/17/2010 0932   BILIRUBINUR NEGATIVE 11/29/2016 1707   BILIRUBINUR n 09/01/2011 1157   KETONESUR NEGATIVE 11/29/2016 1707   PROTEINUR NEGATIVE 11/29/2016 1707   UROBILINOGEN 0.2 09/27/2014 1800   NITRITE NEGATIVE 11/29/2016 1707   LEUKOCYTESUR NEGATIVE 11/29/2016 1707    STUDIES: Since her last visit, she completed a PET scan on 10/15/2017 showing: Enlarging, increasingly hypermetabolic bilateral breast masses. Right axillary and subpectoral lymph nodes have not significantly increased in size,  but are also slightly more hypermetabolic. No distant metastases. Stable sub solid left lung lesion with mild hypermetabolic activity.  ASSESSMENT: 84 y.o. [place] woman with bilateral breast cancer, as follows:  (1) status post right breastt overlapping sites skin biopsy 11/21/2014 for a clinical T4 N1, stage IIIB invasive ductal carcinoma, estrogen and progesterone receptor positive,  HER-2 amplified  (2) status post 2 separate left breast upper outer quadrant biopsies and one left axillary lymph node biopsy 12/04/2014, all positive for a clinical mT1c N1, stage IIA invasive lobular carcinoma, estrogen receptor positive, progesterone receptor variable, with MIB-1 between 10 and 40%, and no HER-2 amplification  (3) neoadjuvant anastrozole started 12/14/2014. Discontinued with progression by repeat left breast US May 2017  (4) definitive surgery to consist of bilateral mastectomies without formal axillary dissection: Postponed  METASTATIC DISEASE: March 2016 (note: R breast CA is HER-2 positive, L is negative) (5) PET scan 12/19/2014 shows in addition to the bilateral breast and bilateral axillary masses, a lesion at the T11 vertebral body, showing an irregular lucency on prior CT exam  (a) adjuvant radiationt to T10-T12: 04/11/2015 through 04/24/2015 to a total dose of 30 gray in 10 fractions  (6) consider anti-HER-2 treatment depending on response to antiestrogen therapy alone.    (7) osteoporosis with T score of -2.6 on bone scan obtained 02/19/2015.  (a) zolendronate started 03/20/2015, repeated every 12 weeks   (8) fulvestrant started 03/04/16--discontinued January 2019 with progression  (a) patient refused adding palbociclib  (b) PET scan in 10/15/2016 showed mild increase in the right breast mass and increased size and activity of 3.2 cm of solid left lung nodule  (c) letrozole added 10/16/2016--discontinued January 2019  (d) PET scan obtained 02/10/2017 showing a significant decrease in SUV in the measurable disease  (e) PET scan 10/15/2017 showing increase in bilateral breast and right axillary adenopathy; left lung lesion is stable  (9) Doppler ultrasound both lower extremities 11/17/2016 and repeat 11/29/2016 are  Negative  (10) to start trastuzumab 11/12/2017, repeated every 2 weeks  (11) to start capecitabine 11/15/2017, received 1 week on 1 week off  (a)  initial dose will be 1000 mg twice daily  PLAN: Lauren Buchanan is now nearly 3 years out from definitive diagnosis of metastatic breast cancer.  She did very well with antiestrogens, but now is progressing through them.  We spent over 40 minutes going over her scan and what it means.  We do not see any change in the lungs, where there is a lesion which may be a separate tumor; we do not see any involvement of the liver, and the bones are quiescent.  What this means is that this tumor is not in a hurry to take her life.  On the other hand it is very likely to make her life exceedingly uncomfortable if it continues to progress locally as it is doing.  It already involves the right nipple and now is breaking through the skin on the left breast as well.  The situation is complicated because only the right breast cancer is HER-2 amplified.  We discussed multiple options including surgery, which would require bilateral modified radical mastectomies, which I do not think she would be able to tolerate; exemestane/everolimus, capecitabine/lapatinib, or what we finally settled on, capecitabine/trastuzumab.  We are going with trastuzumab because it is more effective and because he has less concern regarding diarrhea than lapatinib.  We discussed the possible toxicity side effects and complications of these agents in detail.  Lauren Buchanan will have an  echocardiogram in the next week or so.  Assuming no contraindications she will receive her first trastuzumab dose on 11/12/2017.  Hopefully we will have the capecitabine available for her that day and we will start the following Monday, the initial dose to be 2 tablets twice daily for 1 week.  We will increase that by 1 tablet/day in the subsequent cycles until she reaches the target of 3 tablets twice daily.  I have asked Lauren Buchanan also to find out how long it takes her to walk from her apartment to the cafeteria, since she does that distance 6 times a day (twice for each meal).  If she  cannot time it she will count steps.  They know to call for any other issues that may develop before her next visit here.   Emmalina Espericueta, [name], MD  10/27/17 4:07 PM Medical Oncology and Hematology [facility] [address] [place], [place] [address] Tel. [phone]    Fax. [phone]  This document serves as a record of services personally performed by Lurline Del, MD. It was created on his behalf by Sheron Nightingale, a trained medical scribe. The creation of this record is based on the scribe's personal observations and the provider's statements to them.   I have reviewed the above documentation for accuracy and completeness, and I agree with the above.

## 2017-10-27 ENCOUNTER — Telehealth: Payer: Self-pay | Admitting: Oncology

## 2017-10-27 ENCOUNTER — Inpatient Hospital Stay (HOSPITAL_BASED_OUTPATIENT_CLINIC_OR_DEPARTMENT_OTHER): Payer: Medicare Other | Admitting: Oncology

## 2017-10-27 ENCOUNTER — Inpatient Hospital Stay: Payer: Medicare Other

## 2017-10-27 ENCOUNTER — Inpatient Hospital Stay: Payer: Medicare Other | Attending: Oncology

## 2017-10-27 VITALS — BP 127/53 | HR 85 | Temp 97.6°F | Resp 18 | Ht 60.0 in | Wt 124.2 lb

## 2017-10-27 DIAGNOSIS — C7951 Secondary malignant neoplasm of bone: Secondary | ICD-10-CM | POA: Insufficient documentation

## 2017-10-27 DIAGNOSIS — C50412 Malignant neoplasm of upper-outer quadrant of left female breast: Secondary | ICD-10-CM

## 2017-10-27 DIAGNOSIS — M810 Age-related osteoporosis without current pathological fracture: Secondary | ICD-10-CM | POA: Insufficient documentation

## 2017-10-27 DIAGNOSIS — Z7952 Long term (current) use of systemic steroids: Secondary | ICD-10-CM | POA: Insufficient documentation

## 2017-10-27 DIAGNOSIS — Z800 Family history of malignant neoplasm of digestive organs: Secondary | ICD-10-CM | POA: Diagnosis not present

## 2017-10-27 DIAGNOSIS — C50919 Malignant neoplasm of unspecified site of unspecified female breast: Secondary | ICD-10-CM

## 2017-10-27 DIAGNOSIS — J449 Chronic obstructive pulmonary disease, unspecified: Secondary | ICD-10-CM | POA: Insufficient documentation

## 2017-10-27 DIAGNOSIS — C3482 Malignant neoplasm of overlapping sites of left bronchus and lung: Secondary | ICD-10-CM

## 2017-10-27 DIAGNOSIS — Z803 Family history of malignant neoplasm of breast: Secondary | ICD-10-CM

## 2017-10-27 DIAGNOSIS — Z85828 Personal history of other malignant neoplasm of skin: Secondary | ICD-10-CM | POA: Insufficient documentation

## 2017-10-27 DIAGNOSIS — Z79899 Other long term (current) drug therapy: Secondary | ICD-10-CM | POA: Diagnosis not present

## 2017-10-27 DIAGNOSIS — Z950 Presence of cardiac pacemaker: Secondary | ICD-10-CM | POA: Diagnosis not present

## 2017-10-27 DIAGNOSIS — Z79811 Long term (current) use of aromatase inhibitors: Secondary | ICD-10-CM

## 2017-10-27 DIAGNOSIS — R918 Other nonspecific abnormal finding of lung field: Secondary | ICD-10-CM | POA: Insufficient documentation

## 2017-10-27 DIAGNOSIS — I480 Paroxysmal atrial fibrillation: Secondary | ICD-10-CM

## 2017-10-27 DIAGNOSIS — C773 Secondary and unspecified malignant neoplasm of axilla and upper limb lymph nodes: Secondary | ICD-10-CM | POA: Insufficient documentation

## 2017-10-27 DIAGNOSIS — K219 Gastro-esophageal reflux disease without esophagitis: Secondary | ICD-10-CM

## 2017-10-27 DIAGNOSIS — Z170 Estrogen receptor positive status [ER+]: Secondary | ICD-10-CM | POA: Insufficient documentation

## 2017-10-27 DIAGNOSIS — Z880 Allergy status to penicillin: Secondary | ICD-10-CM | POA: Insufficient documentation

## 2017-10-27 DIAGNOSIS — C50811 Malignant neoplasm of overlapping sites of right female breast: Secondary | ICD-10-CM | POA: Insufficient documentation

## 2017-10-27 DIAGNOSIS — Z66 Do not resuscitate: Secondary | ICD-10-CM | POA: Diagnosis not present

## 2017-10-27 DIAGNOSIS — Z5112 Encounter for antineoplastic immunotherapy: Secondary | ICD-10-CM | POA: Insufficient documentation

## 2017-10-27 DIAGNOSIS — C50912 Malignant neoplasm of unspecified site of left female breast: Secondary | ICD-10-CM

## 2017-10-27 DIAGNOSIS — M316 Other giant cell arteritis: Secondary | ICD-10-CM

## 2017-10-27 DIAGNOSIS — J471 Bronchiectasis with (acute) exacerbation: Secondary | ICD-10-CM

## 2017-10-27 LAB — COMPREHENSIVE METABOLIC PANEL
ALK PHOS: 98 U/L (ref 40–150)
ALT: 20 U/L (ref 0–55)
AST: 27 U/L (ref 5–34)
Albumin: 3.6 g/dL (ref 3.5–5.0)
Anion gap: 7 (ref 3–11)
BILIRUBIN TOTAL: 0.8 mg/dL (ref 0.2–1.2)
BUN: 15 mg/dL (ref 7–26)
CALCIUM: 10.4 mg/dL (ref 8.4–10.4)
CO2: 28 mmol/L (ref 22–29)
Chloride: 99 mmol/L (ref 98–109)
Creatinine, Ser: 1.05 mg/dL (ref 0.60–1.10)
GFR, EST AFRICAN AMERICAN: 53 mL/min — AB (ref >60)
GFR, EST NON AFRICAN AMERICAN: 45 mL/min — AB (ref >60)
Glucose, Bld: 218 mg/dL — ABNORMAL HIGH (ref 70–140)
Potassium: 4.6 mmol/L (ref 3.3–4.7)
Sodium: 134 mmol/L — ABNORMAL LOW (ref 136–145)
Total Protein: 6.8 g/dL (ref 6.4–8.3)

## 2017-10-27 LAB — CBC WITH DIFFERENTIAL/PLATELET
Abs Granulocyte: 9.4 10*3/uL — ABNORMAL HIGH (ref 1.5–6.5)
BASOS ABS: 0 10*3/uL (ref 0.0–0.1)
Basophils Relative: 0 %
EOS ABS: 0 10*3/uL (ref 0.0–0.5)
Eosinophils Relative: 0 %
HCT: 41.8 % (ref 34.8–46.6)
Hemoglobin: 13.6 g/dL (ref 11.6–15.9)
Lymphocytes Relative: 6 %
Lymphs Abs: 0.6 10*3/uL — ABNORMAL LOW (ref 0.9–3.3)
MCH: 29.6 pg (ref 25.1–34.0)
MCHC: 32.5 g/dL (ref 31.5–36.0)
MCV: 90.9 fL (ref 79.5–101.0)
MONO ABS: 0.5 10*3/uL (ref 0.1–0.9)
Monocytes Relative: 5 %
NEUTROS PCT: 89 %
Neutro Abs: 9.4 10*3/uL — ABNORMAL HIGH (ref 1.5–6.5)
Platelets: 278 10*3/uL (ref 145–400)
RBC: 4.6 MIL/uL (ref 3.70–5.45)
RDW: 14.3 % (ref 11.2–16.1)
WBC: 10.5 10*3/uL — AB (ref 3.9–10.3)

## 2017-10-27 MED ORDER — FULVESTRANT 250 MG/5ML IM SOLN: 500.0000 mg | Freq: Once | INTRAMUSCULAR | Status: DC

## 2017-10-27 MED ORDER — CAPECITABINE 500 MG PO TABS: 1500.0000 mg | ORAL_TABLET | Freq: Two times a day (BID) | ORAL | 6 refills | Status: DC

## 2017-10-27 NOTE — Telephone Encounter (Signed)
Gave patient AVs and calendar of upcoming January appointments.  °

## 2017-10-27 NOTE — Patient Instructions (Signed)

## 2017-11-01 ENCOUNTER — Telehealth: Payer: Self-pay | Admitting: Pharmacist

## 2017-11-01 DIAGNOSIS — C50919 Malignant neoplasm of unspecified site of unspecified female breast: Secondary | ICD-10-CM

## 2017-11-01 DIAGNOSIS — C7951 Secondary malignant neoplasm of bone: Principal | ICD-10-CM

## 2017-11-01 MED ORDER — CAPECITABINE 500 MG PO TABS: 1500.0000 mg | ORAL_TABLET | Freq: Two times a day (BID) | ORAL | 6 refills | Status: DC

## 2017-11-01 NOTE — Telephone Encounter (Addendum)
Oral Chemotherapy Pharmacist Encounter   I spoke with patient's daughter, [name], for overview of: Xeloda.   Counseled on administration, dosing, side effects, monitoring, drug-food interactions, safe handling, storage, and disposal.  Patient's Xeloda dose will be up-titrated based on toleration to target dose of 1500mg  BID.  Week 1 (start 11/15/17): Patient will take Xeloda 500mg  tablets, 2 tablets (1000mg ) by mouth in AM and 2 tabs (100mg ) by mouth in PM, within 30 minutes of finishing meals, for 7 days on.  Week 2 (start 11/22/17): 7 days OFF  If week 1 tolerated, week 3 (start 11/29/17): Patient will take Xeloda 500mg  tablets, 3 tablets (1500mg ) by mouth in AM and 2 tabs (1000mg ) by mouth in PM, within 30 minutes of finishing meals, for 7 days on.  Week 4 (start 12/06/17): 7 days OFF  If week 3 tolerated, week 5 (start 12/13/17): Patient will take Xeloda 500mg  tablets, 3 tablets (1500mg ) by mouth in AM and 3 tabs (1500mg ) by mouth in PM, within 30 minutes of finishing meals, for 7 days on.  Patient to continue on a 7 days on, 7 days off schedule for Xeloda.  Written information on titration schedule brought to pharmacy to package with prescription.  Herceptin will be infused at 4mg /kg every 2 weeks. MD to decide on loading dose   Xeloda start date: 11/15/17 Harceptin start date: 11/12/17  Side effects of Xeloda include but not limited to: fatigue, decreased blood counts, GI upset, diarrhea, and hand-foot syndrome. [name] will get loperamide for at home use and will call the office if diarrhea develops.    Reviewed importance of keeping a medication schedule and plan for any missed doses.  [name] voiced understanding and appreciation.   All questions answered. Medication reconciliation performed and medication/allergy list updated.  [name] will pick-up Mrs. Latella's Xeloda from the [facility] tomorrow (11/02/17) for copayment $0. [name] understands Xeloda will not start until  after Herceptin 1st dose.   [name] with request for additional information on where to go for Mrs. Ramiro's ECHO tomorrow and for anti-nausea medication to be sent in to local pharmacy. This was discussed with Visual merchandiser.  Patient knows to call the office with questions or concerns. Oral Oncology Clinic will continue to follow.  Thank you,  Johny Drilling, PharmD, BCPS, BCOP 11/01/2017   1:31 PM Oral Oncology Clinic [phone]

## 2017-11-01 NOTE — Telephone Encounter (Signed)
Oral Oncology Pharmacist Encounter  Received new prescription for Xeloda (capecitabine) for the treatment of triple positive breast cancer in conjunction with infusional Herceptin, planned duration until disease progression or unacceptable toxicity.  Labs from 10/27/17 assessed, OK for treatment. Noted Scr=1.05, pt with low weight and advanced age, est CrCl~30 mL/min, manufacturer recommends 25% dose reduction for CrCl 30-50 mL/min Xeloda dosed at 1000 mg/m2 BID, patient to be up-titrated from 650 mg/m2 BID, to increase to full dose if tolerated. ECHO scheduled for 11/02/17  Current medication list in Epic reviewed, no significant DDIs with Xeloda identified.  Prescription has been e-scribed to the [facility] for benefits analysis and approval. Test claim revealed copayment $214.46, patient will be signed up for copayment grant to reduce out of pocket expense to $0  Oral Oncology Clinic will continue to follow for initial counseling and start date.  Johny Drilling, PharmD, BCPS, BCOP 11/01/2017 12:08 PM Oral Oncology Clinic [phone]

## 2017-11-02 ENCOUNTER — Other Ambulatory Visit (HOSPITAL_COMMUNITY): Payer: Medicare Other

## 2017-11-02 ENCOUNTER — Ambulatory Visit (HOSPITAL_COMMUNITY)
Admission: RE | Admit: 2017-11-02 | Discharge: 2017-11-02 | Disposition: A | Discharge status: Home / Self Care | Payer: Medicare Other | Source: Ambulatory Visit | Attending: Oncology | Admitting: Oncology

## 2017-11-02 DIAGNOSIS — I081 Rheumatic disorders of both mitral and tricuspid valves: Secondary | ICD-10-CM | POA: Insufficient documentation

## 2017-11-02 DIAGNOSIS — C3482 Malignant neoplasm of overlapping sites of left bronchus and lung: Secondary | ICD-10-CM

## 2017-11-02 DIAGNOSIS — C50919 Malignant neoplasm of unspecified site of unspecified female breast: Secondary | ICD-10-CM | POA: Diagnosis not present

## 2017-11-02 DIAGNOSIS — I5030 Unspecified diastolic (congestive) heart failure: Secondary | ICD-10-CM | POA: Insufficient documentation

## 2017-11-02 DIAGNOSIS — J449 Chronic obstructive pulmonary disease, unspecified: Secondary | ICD-10-CM

## 2017-11-02 DIAGNOSIS — C7951 Secondary malignant neoplasm of bone: Secondary | ICD-10-CM | POA: Diagnosis not present

## 2017-11-02 DIAGNOSIS — Z170 Estrogen receptor positive status [ER+]: Secondary | ICD-10-CM

## 2017-11-02 DIAGNOSIS — J471 Bronchiectasis with (acute) exacerbation: Secondary | ICD-10-CM

## 2017-11-02 DIAGNOSIS — C50811 Malignant neoplasm of overlapping sites of right female breast: Secondary | ICD-10-CM

## 2017-11-02 DIAGNOSIS — C50412 Malignant neoplasm of upper-outer quadrant of left female breast: Secondary | ICD-10-CM

## 2017-11-02 DIAGNOSIS — M316 Other giant cell arteritis: Secondary | ICD-10-CM | POA: Diagnosis not present

## 2017-11-02 MED FILL — CAPECITABINE 500 MG TABLET: 500 | 28 days supply | Qty: 84 | Fill #0

## 2017-11-11 NOTE — Progress Notes (Signed)
Lauren Buchanan  [phone] [phone]   ID: ENES WEGENER DOB: [birth year]  MR#: [id number]  CZY#:[id number]  Patient Care Team: Blanchie Serve, MD as PCP - General (Internal Medicine) Fanny Skates, MD as Consulting Physician (General Surgery) [name], [name], MD as Consulting Physician (Oncology) [name], MD as Consulting Physician (Radiation Oncology) Rockwell Germany, RN as Registered Nurse Mauro Kaufmann, RN as Registered Nurse Gentry Fitz, MD as Consulting Physician (Family Medicine) [name], [name], NP as Nurse Practitioner (Internal Medicine) Thompson Grayer, MD as Consulting Physician (Cardiology) PCP: Blanchie Serve, MD GYN: OTHER MD: Danton Sewer MD, Thompson Grayer MD, Unice Bailey MD, Rolm Bookbinder MD  CHIEF COMPLAINT: bilateral estrogen receptor positive breast cancer, right-sided HER-2 positive breast cancer,  bone metastases  CURRENT TREATMENT: zolendronate, capecitabine, trastuzumab  NOTE: Patient requests her daughter [name] be contacted regarding all appointments and procedures. [name] can be reached at [phone]  INTERVAL HISTORY: Lauren Buchanan returns today for follow-up and treatment of her estrogen receptor positive breast cancer accompanied by her daughter at the last visit we stopped her letrozole and discussed multiple treatment options for her locally progressive disease, settling on capecitabine and trastuzumab.  She is scheduled for her first trastuzumab dose today, to be repeated every 14 days  We are planning to start the capecitabine on 11/15/2017 at 2 tablets twice daily, 1 week on 1 week off.  Since we have to see her every 14 days to check on labs for the capecitabine, we are dosing the trastuzumab at 4 mg/kg (and every 14-day dose).  Since her last visit here.had an echocardiogram, on 11/02/2017, showing an ejection fraction in the 50-55% range.    REVIEW OF SYSTEMS: Lauren Buchanan is doing generally fine.  She really pays  no attention to her breasts.  She tells me she walks to the cafeteria and back 6 times daily and each walk is 8-10 minutes.  There has been no bleeding, fever, rash, or other complication that she is aware of.  There has been no change in bowel or bladder habits.  She denies pain.  Detailed review of systems today was otherwise stable   BREAST CANCER HISTORY: From the original intake note:  "Lauren Buchanan"  Has been aware of a sore right nipple for the last 6-8 months. She attributed it to her washing machine, which rubs that area when she uses it. On  11/21/2014 she saw Dr. 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 She mentioned the chest wall problem and Dr. Ubaldo Glassing obtained to skin biopsies 1 from the right inferior central chest and the other one of from the right mid medial chest.  The second of these showed a nodular basal cell carcinoma. The first one however showed invasive carcinoma consistent with a breast primary. This tumor was estrogen receptor positive at 100%, progesterone receptor positive at 36%, both with strong staining intensity, and HER-2 was amplified , the signals ratio being 6.2. [name] [id number])  On 12/06/2014 the patient underwent bilateral diagnostic mammography with bilateral ultrasonography. Breast density was category B. There was bilateral nipple retraction. In the left breast upper outer quadrant there was a spiculated mass noted by mammography with a second spiculated mass in the lower inner quadrant. By ultrasound there was a 1.6 cm hypoechoic lesion at the 1:30 o'clock position in the left breast, and a second mass more medially measuring 0.9 cm. Also at the 8:00 position in the left breast 2 cm from the  nipple there was a 1.3 cm mass. Ultrasound of the left axilla showed an enlarged lymph node with fatty hilum replacement, measuring 1.4 cm.  In the right breast mammography showed a cluster of indeterminate microcalcifications in the upper outer quadrant  and a retroareolar mass, which bile does sound measured 2.9 cm. Ultrasound of the right axilla showed a right axilla lymph node measuring 1.4 cm.  On 12/06/2014 the patient underwent needle core biopsy of 2 separate masses in the left breast (at 1:00 and 8:00) as well as the enlarged axillary lymph node. All 3 were positive for an invasive lobular carcinoma (E-cadherin negative). All 3 tumors were estrogen receptor positive with strong staining intensity (between 87% and 100%). The lymph node was progesterone receptor negative, the other 2 masses being progesterone receptor positive at 9% and at 76%. This cancer was reported to be HER-2 positive at conference today, though I do not have the ratios at hand.  The patient is not an MRI candidate because she has a permanent pacemaker in place. Her subsequent history is as detailed below    PAST MEDICAL HISTORY: Past Medical History:  Diagnosis Date  . Asthma   . Breast cancer ([place]) 12/08/2014   Bilateral  . Cancer of central portion of female breast ([place]) 12/04/2014  . Cancer of central portion of female breast ([place]) 12/04/2014  . Complete heart block ([place]) 1999   s/p PPM by Dr Olevia Perches, most recent generator change 2009  . COPD (chronic obstructive pulmonary disease) ([place])   . Diverticulosis   . DJD (degenerative joint disease)   . Esophageal stricture   . GERD (gastroesophageal reflux disease)   . Giant cell arteritis ([place])   . Goiter   . Hemorrhoids   . Hiatal hernia   . IBS (irritable bowel syndrome)   . Lung cancer ([place])   . Paroxysmal atrial fibrillation ([place]) 12/17/2015   asymptomatic, <1%, determined on regular pacemaker interrogation  . Pneumonia   . Renal cyst   . S/P radiation therapy 04/24/15 completed   T-11  . Wears glasses     PAST SURGICAL HISTORY: Past Surgical History:  Procedure Laterality Date  . ANAL FISSURE REPAIR    . ARTERY BIOPSY Left 01/03/2013   Procedure: BIOPSY LEFT TEMPORAL ARTERY;  Surgeon: Ascencion Dike,  MD;  Location: [facility];  Service: ENT;  Laterality: Left;  . BREAST BIOPSY Right   . CHOLECYSTECTOMY    . COLONOSCOPY    . FRACTURE SURGERY     fx lt wrist  . hemorrhoidectomy    . left lower lobectomy for brochiectasis  1950  . MOUTH SURGERY    . PACEMAKER INSERTION  1999   Gen change ([facility]) by Dr Olevia Perches 2009  . RECTAL POLYPECTOMY    . RENAL CYST EXCISION    . TONSILLECTOMY      FAMILY HISTORY Family History  Problem Relation Age of Onset  . Heart disease Mother   . Breast cancer Mother   . Stomach cancer Father   . Alcohol abuse Father   . Seizures Son   . Thyroid disease Sister    the patient's father died at the age of 32, with stomach cancer. The patient's mother died at the age of 62 from a myocardial infarction. She had been diagnosed with breast cancer in her 21s. The patient had no brothers, 2 sisters. There is no other history of breast or ovarian cancer in the family to the patient's knowledge  GYNECOLOGIC HISTORY:  No LMP recorded. Patient is postmenopausal. Menarche age 39, first live birth age 7. The patient is GX P4. She underwent menopause in her late 49s. She did not take hormone replacement.  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 She is a widow, lives by herself, with no pets. Daughter Wendy Poet lives in [place] where she is Mudlogger of a preschool and day care. Son Gearlene Godsil lives in [place] where he owns a furniture to sign store. Daughter Shelly Rubenstein lives in [place]. She owns a Chiropractor. Son Randall Hiss is physically and mentally disabled and lives in a group home in [place]. The patient has 7 grandchildren and 2 great-grandchildren. She attends a local [place] [employer] [place] [employer]: In place; the patient does not wish to be resuscitated in case of a terminal event; the patient's daughter [name] is her healthcare power of attorney. She can be reached at [phone]. The patient has a DO  NOT RESUSCITATE order in place at [employer].  HEALTH MAINTENANCE: Social History   Tobacco Use  . Smoking status: Never Smoker  . Smokeless tobacco: Never Used  Substance Use Topics  . Alcohol use: Yes    Alcohol/week: 0.0 oz    Comment: Occasional   . Drug use: No     Colonoscopy:  PAP:  Bone density:02/19/2015, osteoporosis  Lipid panel:  Allergies  Allergen Reactions  . Morphine And Related Nausea Only  . Amoxicillin-Pot Clavulanate Other (See Comments)    Unknown     Current Outpatient Medications  Medication Sig Dispense Refill  . Calcium Citrate-Vitamin D (CITRACAL PETITES/VITAMIN D) 200-250 MG-UNIT TABS Take 1 tablet by mouth daily.      . capecitabine (XELODA) 500 MG tablet Take 3 tablets (1,500 mg total) by mouth 2 (two) times daily after a meal. Take for 7 days on, 7 days off, repeat. 84 tablet 6  . diclofenac sodium (VOLTAREN) 1 % GEL Apply topically 3 (three) times daily as needed.    . lactose free nutrition (BOOST) LIQD Take 237 mLs daily by mouth.    . metoprolol succinate (TOPROL-XL) 100 MG 24 hr tablet TAKE 1/2 (ONE-HALF) TABLET BY MOUTH EVERY MORNING. Take with or immediately following a meal. 45 tablet 3  . predniSONE (DELTASONE) 5 MG tablet Take 5 mg by mouth daily with breakfast. Reported on 11/19/2015    . traMADol (ULTRAM) 50 MG tablet Take 50 mg by mouth every 6 (six) hours as needed.     . TURMERIC PO Take 1 capsule by mouth daily.     No current facility-administered medications for this visit.     OBJECTIVE: Elderly white woman who appears stated age  48:   11/12/17 0840  BP: (!) 153/58  Pulse: 80  Resp: 20  Temp: 98.7 F (37.1 C)  SpO2: 95%     Body mass index is 24.35 kg/m.    ECOG FS:1 - Symptomatic but completely ambulatory  Sclerae unicteric, pupils round and equal Oropharynx clear and moist No cervical or supraclavicular adenopathy Lungs no rales or rhonchi Heart regular rate and rhythm Abd soft, nontender, positive  bowel sounds MSK no focal spinal tenderness, no upper extremity lymphedema Neuro: nonfocal, well oriented, appropriate affect Breasts: Deferred  Right Breast 10/27/2017    Left Breast 10/27/2017    Right breast 08/04/2017    LAB RESULTS:  CMP     Component Value Date/Time   NA 134 (L) 10/27/2017 1432  NA 136 09/29/2017 1435   K 4.6 10/27/2017 1432   K 4.1 09/29/2017 1435   CL 99 10/27/2017 1432   CO2 28 10/27/2017 1432   CO2 27 09/29/2017 1435   GLUCOSE 218 (H) 10/27/2017 1432   GLUCOSE 148 (H) 09/29/2017 1435   BUN 15 10/27/2017 1432   BUN 14.9 09/29/2017 1435   CREATININE 1.05 10/27/2017 1432   CREATININE 1.0 09/29/2017 1435   CALCIUM 10.4 10/27/2017 1432   CALCIUM 9.3 09/29/2017 1435   PROT 6.8 10/27/2017 1432   PROT 7.0 09/29/2017 1435   ALBUMIN 3.6 10/27/2017 1432   ALBUMIN 3.6 09/29/2017 1435   AST 27 10/27/2017 1432   AST 28 09/29/2017 1435   ALT 20 10/27/2017 1432   ALT 22 09/29/2017 1435   ALKPHOS 98 10/27/2017 1432   ALKPHOS 89 09/29/2017 1435   BILITOT 0.8 10/27/2017 1432   BILITOT 1.12 09/29/2017 1435   GFRNONAA 45 (L) 10/27/2017 1432   GFRAA 53 (L) 10/27/2017 1432    INo results found for: SPEP, UPEP  Lab Results  Component Value Date   WBC 7.3 11/12/2017   NEUTROABS 5.7 11/12/2017   HGB 13.0 11/12/2017   HCT 39.6 11/12/2017   MCV 88.9 11/12/2017   PLT 256 11/12/2017      Chemistry      Component Value Date/Time   NA 134 (L) 10/27/2017 1432   NA 136 09/29/2017 1435   K 4.6 10/27/2017 1432   K 4.1 09/29/2017 1435   CL 99 10/27/2017 1432   CO2 28 10/27/2017 1432   CO2 27 09/29/2017 1435   BUN 15 10/27/2017 1432   BUN 14.9 09/29/2017 1435   CREATININE 1.05 10/27/2017 1432   CREATININE 1.0 09/29/2017 1435   GLU 94 02/23/2017      Component Value Date/Time   CALCIUM 10.4 10/27/2017 1432   CALCIUM 9.3 09/29/2017 1435   ALKPHOS 98 10/27/2017 1432   ALKPHOS 89 09/29/2017 1435   AST 27 10/27/2017 1432   AST 28 09/29/2017 1435    ALT 20 10/27/2017 1432   ALT 22 09/29/2017 1435   BILITOT 0.8 10/27/2017 1432   BILITOT 1.12 09/29/2017 1435       Lab Results  Component Value Date   LABCA2 20 04/01/2016    No components found for: [id number]  No results for input(s): INR in the last 168 hours.  Urinalysis    Component Value Date/Time   COLORURINE COLORLESS (A) 11/29/2016 1707   APPEARANCEUR CLEAR 11/29/2016 1707   LABSPEC 1.002 (L) 11/29/2016 1707   PHURINE 7.0 11/29/2016 1707   GLUCOSEU NEGATIVE 11/29/2016 1707   HGBUR SMALL (A) 11/29/2016 1707   HGBUR trace-intact 01/17/2010 0932   BILIRUBINUR NEGATIVE 11/29/2016 1707   BILIRUBINUR n 09/01/2011 1157   KETONESUR NEGATIVE 11/29/2016 1707   PROTEINUR NEGATIVE 11/29/2016 1707   UROBILINOGEN 0.2 09/27/2014 1800   NITRITE NEGATIVE 11/29/2016 1707   LEUKOCYTESUR NEGATIVE 11/29/2016 1707    STUDIES: Since her last visit, she completed a PET scan on 10/15/2017 showing: Enlarging, increasingly hypermetabolic bilateral breast masses. Right axillary and subpectoral lymph nodes have not significantly increased in size, but are also slightly more hypermetabolic. No distant metastases. Stable sub solid left lung lesion with mild hypermetabolic activity.  ASSESSMENT: 84 y.o. [place] woman with bilateral breast cancer, as follows:  (1) status post right breastt overlapping sites skin biopsy 11/21/2014 for a clinical T4 N1, stage IIIB invasive ductal carcinoma, estrogen and progesterone receptor positive, HER-2 amplified  (2) status post 2  separate left breast upper outer quadrant biopsies and one left axillary lymph node biopsy 12/04/2014, all positive for a clinical mT1c N1, stage IIA invasive lobular carcinoma, estrogen receptor positive, progesterone receptor variable, with MIB-1 between 10 and 40%, and no HER-2 amplification  (3) neoadjuvant anastrozole started 12/14/2014. Discontinued with progression by repeat left breast US May 2017  (4) definitive  surgery to consist of bilateral mastectomies without formal axillary dissection: Postponed  METASTATIC DISEASE: March 2016 (note: R breast CA is HER-2 positive, L is negative) (5) PET scan 12/19/2014 shows in addition to the bilateral breast and bilateral axillary masses, a lesion at the T11 vertebral body, showing an irregular lucency on prior CT exam  (a) adjuvant radiationt to T10-T12: 04/11/2015 through 04/24/2015 to a total dose of 30 gray in 10 fractions  (6) consider anti-HER-2 treatment depending on response to antiestrogen therapy alone.    (7) osteoporosis with T score of -2.6 on bone scan obtained 02/19/2015.  (a) zolendronate started 03/20/2015, repeated every 12 weeks   (8) fulvestrant started 03/04/16--discontinued January 2019 with progression  (a) patient refused adding palbociclib  (b) PET scan in 10/15/2016 showed mild increase in the right breast mass and increased size and activity of 3.2 cm of solid left lung nodule  (c) letrozole added 10/16/2016--discontinued January 2019  (d) PET scan obtained 02/10/2017 showing a significant decrease in SUV in the measurable disease  (e) PET scan 10/15/2017 showing increase in bilateral breast and right axillary adenopathy; left lung lesion is stable  (9) Doppler ultrasound both lower extremities 11/17/2016 and repeat 11/29/2016 are  Negative  (10) to start trastuzumab 11/12/2017, repeated every 2 weeks  (11) to start capecitabine 11/15/2017, received 1 week on 1 week off  (a) initial dose will be 1000 mg twice daily  PLAN: Lauren Buchanan is now just about 3 years out from definitive diagnosis of metastatic disease, and clinically she is relatively stable.  Her metastatic disease is not really the problem.  She is having locally progressive disease and now has bilateral breast cancer which is beginning to break through the skin.  Both her breast cancers are estrogen receptor positive.  The one on the right is also HER-2 positive.  We have  stopped her letrozole and she will receive her first trastuzumab dose today.  She has an excellent baseline echo.  I am referring her to our cardio oncology group for closer follow-up  Today we again discussed the possible toxicity side effects and complications of trastuzumab.  She was horrified to learn that the treatment may take several hours and initially refused.  I am hoping her daughter can coax her to have her first treatment.  If she does undergo the treatment and does tolerated well, we will consider placing a port.  We are also going to start her on low-dose capecitabine, namely 1000 mg twice daily 1 week on 1 week off.  If we can get results on this combination there will be no need for dose escalation, otherwise we will increase the dose of capecitabine as tolerated  We are going to see her every 2 weeks at least with the first few doses.  If this does prove to be useful for her and she tolerates it well we will start seeing her monthly, continuing treatments every 2 weeks of course.   [name], [name], MD  11/12/17 8:59 AM Medical Oncology and Hematology [facility] [address] [place], [place] [address] Tel. [phone]    Fax. [phone]  This document serves as a record of services personally performed by Lurline Del, MD. It was created on his behalf by Sheron Nightingale, a trained medical scribe. The creation of this record is based on the scribe's personal observations and the provider's statements to them.  I have reviewed the above documentation for accuracy and completeness, and I agree with the above.

## 2017-11-12 ENCOUNTER — Telehealth: Payer: Self-pay | Admitting: Pharmacy Technician

## 2017-11-12 ENCOUNTER — Telehealth: Payer: Self-pay | Admitting: Oncology

## 2017-11-12 ENCOUNTER — Inpatient Hospital Stay: Payer: Medicare Other

## 2017-11-12 ENCOUNTER — Inpatient Hospital Stay (HOSPITAL_BASED_OUTPATIENT_CLINIC_OR_DEPARTMENT_OTHER): Payer: Medicare Other | Admitting: Oncology

## 2017-11-12 ENCOUNTER — Other Ambulatory Visit: Payer: Self-pay | Admitting: Oncology

## 2017-11-12 VITALS — BP 153/58 | HR 80 | Temp 98.7°F | Resp 20 | Ht 60.0 in | Wt 124.7 lb

## 2017-11-12 DIAGNOSIS — C50412 Malignant neoplasm of upper-outer quadrant of left female breast: Secondary | ICD-10-CM | POA: Diagnosis not present

## 2017-11-12 DIAGNOSIS — C773 Secondary and unspecified malignant neoplasm of axilla and upper limb lymph nodes: Secondary | ICD-10-CM | POA: Diagnosis not present

## 2017-11-12 DIAGNOSIS — J471 Bronchiectasis with (acute) exacerbation: Secondary | ICD-10-CM

## 2017-11-12 DIAGNOSIS — C3432 Malignant neoplasm of lower lobe, left bronchus or lung: Secondary | ICD-10-CM

## 2017-11-12 DIAGNOSIS — Z170 Estrogen receptor positive status [ER+]: Principal | ICD-10-CM

## 2017-11-12 DIAGNOSIS — Z85828 Personal history of other malignant neoplasm of skin: Secondary | ICD-10-CM

## 2017-11-12 DIAGNOSIS — R918 Other nonspecific abnormal finding of lung field: Secondary | ICD-10-CM

## 2017-11-12 DIAGNOSIS — C50811 Malignant neoplasm of overlapping sites of right female breast: Secondary | ICD-10-CM

## 2017-11-12 DIAGNOSIS — K219 Gastro-esophageal reflux disease without esophagitis: Secondary | ICD-10-CM

## 2017-11-12 DIAGNOSIS — I480 Paroxysmal atrial fibrillation: Secondary | ICD-10-CM

## 2017-11-12 DIAGNOSIS — M810 Age-related osteoporosis without current pathological fracture: Secondary | ICD-10-CM

## 2017-11-12 DIAGNOSIS — Z800 Family history of malignant neoplasm of digestive organs: Secondary | ICD-10-CM

## 2017-11-12 DIAGNOSIS — C7951 Secondary malignant neoplasm of bone: Secondary | ICD-10-CM

## 2017-11-12 DIAGNOSIS — Z79811 Long term (current) use of aromatase inhibitors: Secondary | ICD-10-CM | POA: Diagnosis not present

## 2017-11-12 DIAGNOSIS — C50912 Malignant neoplasm of unspecified site of left female breast: Secondary | ICD-10-CM

## 2017-11-12 DIAGNOSIS — J449 Chronic obstructive pulmonary disease, unspecified: Secondary | ICD-10-CM

## 2017-11-12 DIAGNOSIS — Z950 Presence of cardiac pacemaker: Secondary | ICD-10-CM

## 2017-11-12 DIAGNOSIS — Z7952 Long term (current) use of systemic steroids: Secondary | ICD-10-CM | POA: Diagnosis not present

## 2017-11-12 DIAGNOSIS — Z803 Family history of malignant neoplasm of breast: Secondary | ICD-10-CM

## 2017-11-12 DIAGNOSIS — C50919 Malignant neoplasm of unspecified site of unspecified female breast: Secondary | ICD-10-CM

## 2017-11-12 DIAGNOSIS — Z880 Allergy status to penicillin: Secondary | ICD-10-CM

## 2017-11-12 DIAGNOSIS — Z79899 Other long term (current) drug therapy: Secondary | ICD-10-CM

## 2017-11-12 DIAGNOSIS — Z66 Do not resuscitate: Secondary | ICD-10-CM

## 2017-11-12 DIAGNOSIS — Z5112 Encounter for antineoplastic immunotherapy: Secondary | ICD-10-CM | POA: Diagnosis not present

## 2017-11-12 LAB — COMPREHENSIVE METABOLIC PANEL
ALT: 15 U/L (ref 0–55)
ANION GAP: 9 (ref 3–11)
AST: 23 U/L (ref 5–34)
Albumin: 3.3 g/dL — ABNORMAL LOW (ref 3.5–5.0)
Alkaline Phosphatase: 80 U/L (ref 40–150)
BILIRUBIN TOTAL: 0.8 mg/dL (ref 0.2–1.2)
BUN: 18 mg/dL (ref 7–26)
CO2: 27 mmol/L (ref 22–29)
Calcium: 9 mg/dL (ref 8.4–10.4)
Chloride: 103 mmol/L (ref 98–109)
Creatinine, Ser: 0.87 mg/dL (ref 0.60–1.10)
GFR calc Af Amer: >60 mL/min (ref >60)
GFR, EST NON AFRICAN AMERICAN: 57 mL/min — AB (ref >60)
Glucose, Bld: 134 mg/dL (ref 70–140)
Potassium: 3.9 mmol/L (ref 3.3–4.7)
Sodium: 139 mmol/L (ref 136–145)
TOTAL PROTEIN: 6.3 g/dL — AB (ref 6.4–8.3)

## 2017-11-12 LAB — CBC WITH DIFFERENTIAL/PLATELET
BASOS ABS: 0 10*3/uL (ref 0.0–0.1)
BASOS PCT: 1 %
Eosinophils Absolute: 0.1 10*3/uL (ref 0.0–0.5)
Eosinophils Relative: 2 %
HCT: 39.6 % (ref 34.8–46.6)
HEMOGLOBIN: 13 g/dL (ref 11.6–15.9)
Lymphocytes Relative: 9 %
Lymphs Abs: 0.6 10*3/uL — ABNORMAL LOW (ref 0.9–3.3)
MCH: 29.3 pg (ref 25.1–34.0)
MCHC: 32.9 g/dL (ref 31.5–36.0)
MCV: 88.9 fL (ref 79.5–101.0)
Monocytes Absolute: 0.8 10*3/uL (ref 0.1–0.9)
Monocytes Relative: 11 %
NEUTROS ABS: 5.7 10*3/uL (ref 1.5–6.5)
NEUTROS PCT: 77 %
Platelets: 256 10*3/uL (ref 145–400)
RBC: 4.46 MIL/uL (ref 3.70–5.45)
RDW: 14.8 % (ref 11.2–16.1)
WBC: 7.3 10*3/uL (ref 3.9–10.3)

## 2017-11-12 MED ORDER — DIPHENHYDRAMINE HCL 25 MG PO CAPS
ORAL_CAPSULE | ORAL | Status: AC
  Filled 2017-11-12: qty 1

## 2017-11-12 MED ORDER — DEXAMETHASONE SODIUM PHOSPHATE 100 MG/10ML IJ SOLN: 4.0000 mg | Freq: Once | INTRAMUSCULAR | Status: DC

## 2017-11-12 MED ORDER — SODIUM CHLORIDE 0.9 % IV SOLN
4.0000 mg/kg | Freq: Once | INTRAVENOUS | Status: AC
  Administered 2017-11-12: 231 mg via INTRAVENOUS
  Filled 2017-11-12: qty 11

## 2017-11-12 MED ORDER — SODIUM CHLORIDE 0.9 % IV SOLN
Freq: Once | INTRAVENOUS | Status: AC
  Administered 2017-11-12: 10:00:00 via INTRAVENOUS

## 2017-11-12 MED ORDER — DEXAMETHASONE SODIUM PHOSPHATE 10 MG/ML IJ SOLN
INTRAMUSCULAR | Status: AC
  Filled 2017-11-12: qty 1

## 2017-11-12 MED ORDER — ACETAMINOPHEN 325 MG PO TABS
ORAL_TABLET | ORAL | Status: AC
  Filled 2017-11-12: qty 2

## 2017-11-12 MED ORDER — DEXAMETHASONE SODIUM PHOSPHATE 10 MG/ML IJ SOLN
4.0000 mg | Freq: Once | INTRAMUSCULAR | Status: AC
  Administered 2017-11-12: 4 mg via INTRAVENOUS

## 2017-11-12 MED ORDER — ACETAMINOPHEN 325 MG PO TABS
650.0000 mg | ORAL_TABLET | Freq: Once | ORAL | Status: AC
  Administered 2017-11-12: 650 mg via ORAL

## 2017-11-12 MED ORDER — DIPHENHYDRAMINE HCL 25 MG PO CAPS
25.0000 mg | ORAL_CAPSULE | Freq: Once | ORAL | Status: AC
  Administered 2017-11-12: 25 mg via ORAL

## 2017-11-12 NOTE — Patient Instructions (Signed)
Trastuzumab injection for infusion What is this medicine? TRASTUZUMAB (tras TOO zoo mab) is a monoclonal antibody. It is used to treat breast cancer and stomach cancer. This medicine may be used for other purposes; ask your health care provider or pharmacist if you have questions. COMMON BRAND NAME(S): Herceptin What should I tell my health care provider before I take this medicine? They need to know if you have any of these conditions: -heart disease -heart failure -lung or breathing disease, like asthma -an unusual or allergic reaction to trastuzumab, benzyl alcohol, or other medications, foods, dyes, or preservatives -pregnant or trying to get pregnant -breast-feeding How should I use this medicine? This drug is given as an infusion into a vein. It is administered in a hospital or clinic by a specially trained health care professional. Talk to your pediatrician regarding the use of this medicine in children. This medicine is not approved for use in children. Overdosage: If you think you have taken too much of this medicine contact a poison control center or emergency room at once. NOTE: This medicine is only for you. Do not share this medicine with others. What if I miss a dose? It is important not to miss a dose. Call your doctor or health care professional if you are unable to keep an appointment. What may interact with this medicine? This medicine may interact with the following medications: -certain types of chemotherapy, such as daunorubicin, doxorubicin, epirubicin, and idarubicin This list may not describe all possible interactions. Give your health care provider a list of all the medicines, herbs, non-prescription drugs, or dietary supplements you use. Also tell them if you smoke, drink alcohol, or use illegal drugs. Some items may interact with your medicine. What should I watch for while using this medicine? Visit your doctor for checks on your progress. Report any side effects.  Continue your course of treatment even though you feel ill unless your doctor tells you to stop. Call your doctor or health care professional for advice if you get a fever, chills or sore throat, or other symptoms of a cold or flu. Do not treat yourself. Try to avoid being around people who are sick. You may experience fever, chills and shaking during your first infusion. These effects are usually mild and can be treated with other medicines. Report any side effects during the infusion to your health care professional. Fever and chills usually do not happen with later infusions. Do not become pregnant while taking this medicine or for 7 months after stopping it. Women should inform their doctor if they wish to become pregnant or think they might be pregnant. Women of child-bearing potential will need to have a negative pregnancy test before starting this medicine. There is a potential for serious side effects to an unborn child. Talk to your health care professional or pharmacist for more information. Do not breast-feed an infant while taking this medicine or for 7 months after stopping it. Women must use effective birth control with this medicine. What side effects may I notice from receiving this medicine? Side effects that you should report to your doctor or health care professional as soon as possible: -allergic reactions like skin rash, itching or hives, swelling of the face, lips, or tongue -chest pain or palpitations -cough -dizziness -feeling faint or lightheaded, falls -fever -general ill feeling or flu-like symptoms -signs of worsening heart failure like breathing problems; swelling in your legs and feet -unusually weak or tired Side effects that usually do not require medical   attention (report to your doctor or health care professional if they continue or are bothersome): -bone pain -changes in taste -diarrhea -joint pain -nausea/vomiting -weight loss This list may not describe all  possible side effects. Call your doctor for medical advice about side effects. You may report side effects to FDA at 1-800-FDA-1088. Where should I keep my medicine? This drug is given in a hospital or clinic and will not be stored at home. NOTE: This sheet is a summary. It may not cover all possible information. If you have questions about this medicine, talk to your doctor, pharmacist, or health care provider.  2018 Elsevier/Gold Standard (2016-09-29 14:37:52)  

## 2017-11-12 NOTE — Telephone Encounter (Signed)
Oral Oncology Patient Advocate Encounter  Was successful in securing patient an $ 2500 grant from [facility] [place] (PAF) to provide copayment coverage for her Xeloda.  This will keep the out of pocket expense at $0.    I have spoken with the patient.    The billing information is as follows and has been shared with [address].   Member ID: [id number] Group ID: [id number] RxBin: [id number] Dates of Eligibility: 11/01/2017 through 11/02/2018  Fabio Asa. Melynda Keller, [place] [facility] [place] [phone] 11/12/2017 9:46 AM

## 2017-11-12 NOTE — Progress Notes (Signed)
Confirmed w/ MD - No load. Kennith Center, Pharm.D., CPP 11/12/2017@9 :49 AM

## 2017-11-12 NOTE — Telephone Encounter (Signed)
Gave patient avs and calendar with appts per 1/25 los.

## 2017-11-15 ENCOUNTER — Telehealth: Payer: Self-pay | Admitting: *Deleted

## 2017-11-15 NOTE — Telephone Encounter (Signed)
-----   Message from Herschell Dimes, RN sent at 11/12/2017  3:42 PM EST ----- Regarding: [name] - first time herceptin Contact: [phone] Dr. [name] patient, Mrs. Clover Feehan tolerated her Herceptin well [phone]

## 2017-11-15 NOTE — Telephone Encounter (Signed)
Attempted TC to patient to follow up on 1st time Herceptin on 11/12/17. No answer and no voice mail avalable to leave message.  Pt's next appt @ [facility] is 11/26/17

## 2017-11-18 ENCOUNTER — Telehealth: Payer: Self-pay | Admitting: Adult Health

## 2017-11-18 NOTE — Telephone Encounter (Signed)
Received call from patient daughter Lauren Buchanan.  Lauren Buchanan is complaining of increasing fatigue, achiness, and some diarrhea.  She is taking Xeloda, 2 tab bid, and received Trastuzumab on 1/25.  Lauren Buchanan does not think patient needs to be seen today, but Lauren Buchanan will come in tomorrow at 830 am for lab, and at 9 am on my schedule.  I gave the Lauren Buchanan these appointments and she verbalized understanding.    Wilber Bihari, NP

## 2017-11-19 ENCOUNTER — Inpatient Hospital Stay: Payer: Medicare Other | Attending: Oncology

## 2017-11-19 ENCOUNTER — Inpatient Hospital Stay (HOSPITAL_BASED_OUTPATIENT_CLINIC_OR_DEPARTMENT_OTHER): Payer: Medicare Other | Admitting: Adult Health

## 2017-11-19 VITALS — BP 141/69 | HR 90 | Temp 99.0°F | Resp 18 | Ht 60.0 in | Wt 122.5 lb

## 2017-11-19 DIAGNOSIS — Z79899 Other long term (current) drug therapy: Secondary | ICD-10-CM

## 2017-11-19 DIAGNOSIS — M1990 Unspecified osteoarthritis, unspecified site: Secondary | ICD-10-CM

## 2017-11-19 DIAGNOSIS — K219 Gastro-esophageal reflux disease without esophagitis: Secondary | ICD-10-CM | POA: Diagnosis not present

## 2017-11-19 DIAGNOSIS — Z950 Presence of cardiac pacemaker: Secondary | ICD-10-CM | POA: Insufficient documentation

## 2017-11-19 DIAGNOSIS — C50912 Malignant neoplasm of unspecified site of left female breast: Secondary | ICD-10-CM

## 2017-11-19 DIAGNOSIS — M810 Age-related osteoporosis without current pathological fracture: Secondary | ICD-10-CM

## 2017-11-19 DIAGNOSIS — R5383 Other fatigue: Secondary | ICD-10-CM | POA: Diagnosis not present

## 2017-11-19 DIAGNOSIS — C773 Secondary and unspecified malignant neoplasm of axilla and upper limb lymph nodes: Secondary | ICD-10-CM | POA: Diagnosis not present

## 2017-11-19 DIAGNOSIS — Z5112 Encounter for antineoplastic immunotherapy: Secondary | ICD-10-CM | POA: Insufficient documentation

## 2017-11-19 DIAGNOSIS — C3492 Malignant neoplasm of unspecified part of left bronchus or lung: Secondary | ICD-10-CM

## 2017-11-19 DIAGNOSIS — Z923 Personal history of irradiation: Secondary | ICD-10-CM | POA: Insufficient documentation

## 2017-11-19 DIAGNOSIS — Z1501 Genetic susceptibility to malignant neoplasm of breast: Secondary | ICD-10-CM | POA: Insufficient documentation

## 2017-11-19 DIAGNOSIS — C50811 Malignant neoplasm of overlapping sites of right female breast: Secondary | ICD-10-CM

## 2017-11-19 DIAGNOSIS — Z803 Family history of malignant neoplasm of breast: Secondary | ICD-10-CM

## 2017-11-19 DIAGNOSIS — I480 Paroxysmal atrial fibrillation: Secondary | ICD-10-CM | POA: Insufficient documentation

## 2017-11-19 DIAGNOSIS — R110 Nausea: Secondary | ICD-10-CM

## 2017-11-19 DIAGNOSIS — Z170 Estrogen receptor positive status [ER+]: Secondary | ICD-10-CM | POA: Diagnosis not present

## 2017-11-19 DIAGNOSIS — Z66 Do not resuscitate: Secondary | ICD-10-CM | POA: Diagnosis not present

## 2017-11-19 DIAGNOSIS — J449 Chronic obstructive pulmonary disease, unspecified: Secondary | ICD-10-CM

## 2017-11-19 DIAGNOSIS — C50911 Malignant neoplasm of unspecified site of right female breast: Secondary | ICD-10-CM | POA: Insufficient documentation

## 2017-11-19 DIAGNOSIS — C7951 Secondary malignant neoplasm of bone: Secondary | ICD-10-CM | POA: Insufficient documentation

## 2017-11-19 DIAGNOSIS — C50412 Malignant neoplasm of upper-outer quadrant of left female breast: Secondary | ICD-10-CM

## 2017-11-19 DIAGNOSIS — M791 Myalgia, unspecified site: Secondary | ICD-10-CM | POA: Diagnosis not present

## 2017-11-19 DIAGNOSIS — R918 Other nonspecific abnormal finding of lung field: Secondary | ICD-10-CM | POA: Diagnosis not present

## 2017-11-19 DIAGNOSIS — R197 Diarrhea, unspecified: Secondary | ICD-10-CM | POA: Insufficient documentation

## 2017-11-19 DIAGNOSIS — C50919 Malignant neoplasm of unspecified site of unspecified female breast: Secondary | ICD-10-CM

## 2017-11-19 LAB — COMPREHENSIVE METABOLIC PANEL
ALBUMIN: 3.3 g/dL — AB (ref 3.5–5.0)
ALT: 68 U/L — ABNORMAL HIGH (ref 0–55)
ANION GAP: 9 (ref 3–11)
AST: 36 U/L — AB (ref 5–34)
Alkaline Phosphatase: 105 U/L (ref 40–150)
BUN: 18 mg/dL (ref 7–26)
CO2: 28 mmol/L (ref 22–29)
Calcium: 9.4 mg/dL (ref 8.4–10.4)
Chloride: 102 mmol/L (ref 98–109)
Creatinine, Ser: 0.89 mg/dL (ref 0.60–1.10)
GFR calc Af Amer: >60 mL/min (ref >60)
GFR calc non Af Amer: 55 mL/min — ABNORMAL LOW (ref >60)
GLUCOSE: 161 mg/dL — AB (ref 70–140)
POTASSIUM: 4.5 mmol/L (ref 3.5–5.1)
Sodium: 139 mmol/L (ref 136–145)
TOTAL PROTEIN: 6.7 g/dL (ref 6.4–8.3)
Total Bilirubin: 0.7 mg/dL (ref 0.2–1.2)

## 2017-11-19 LAB — CBC WITH DIFFERENTIAL/PLATELET
BASOS ABS: 0 10*3/uL (ref 0.0–0.1)
Basophils Relative: 0 %
Eosinophils Absolute: 0.2 10*3/uL (ref 0.0–0.5)
Eosinophils Relative: 2 %
HEMATOCRIT: 41.6 % (ref 34.8–46.6)
Hemoglobin: 13.7 g/dL (ref 11.6–15.9)
LYMPHS ABS: 0.7 10*3/uL — AB (ref 0.9–3.3)
LYMPHS PCT: 9 %
MCH: 29.5 pg (ref 25.1–34.0)
MCHC: 33 g/dL (ref 31.5–36.0)
MCV: 89.6 fL (ref 79.5–101.0)
MONO ABS: 0.6 10*3/uL (ref 0.1–0.9)
MONOS PCT: 9 %
NEUTROS ABS: 5.9 10*3/uL (ref 1.5–6.5)
Neutrophils Relative %: 80 %
Platelets: 298 10*3/uL (ref 145–400)
RBC: 4.64 MIL/uL (ref 3.70–5.45)
RDW: 14.6 % — AB (ref 11.2–14.5)
WBC: 7.4 10*3/uL (ref 3.9–10.3)

## 2017-11-19 NOTE — Progress Notes (Signed)
[place]  [phone] [phone]   ID: Lauren Buchanan DOB: 06-18-1925  MR#: [id number]  HER#:[id number]  Patient Care Team: Blanchie Serve, MD as PCP - General (Internal Medicine) Fanny Skates, MD as Consulting Physician (General Surgery) [name], [name], MD as Consulting Physician (Oncology) [name], MD as Consulting Physician (Radiation Oncology) Rockwell Germany, RN as Registered Nurse Mauro Kaufmann, RN as Registered Nurse Gentry Fitz, MD as Consulting Physician (Family Medicine) [name], [name], NP as Nurse Practitioner (Internal Medicine) Thompson Grayer, MD as Consulting Physician (Cardiology) PCP: Blanchie Serve, MD GYN: OTHER MD: Danton Sewer MD, Thompson Grayer MD, Unice Bailey MD, Rolm Bookbinder MD  CHIEF COMPLAINT: bilateral estrogen receptor positive breast cancer, right-sided HER-2 positive breast cancer,  bone metastases  CURRENT TREATMENT: zolendronate, capecitabine, trastuzumab  NOTE: Patient requests her daughter Lauren Buchanan be contacted regarding all appointments and procedures. Lauren Buchanan can be reached at [phone]  INTERVAL HISTORY: Lauren Buchanan returns today for follow-up and evaluation accompanied by her daughter Lauren Buchanan called me yesterday due to a concern that her mom had been complaining to her of achiness, fatigue, nausea, and some diarrhea.  Lauren Buchanan tells me that she has a bad memory and the only things she notes is 2 loose bowel movements per day.    Lauren Buchanan started on Capecitabine 2 tabs BID on 1/28.  She also received Trastuzumab on 11/12/2017.    Her last echocardiogram was on 11/02/2017, showing an ejection fraction in the 50-55% range.  REVIEW OF SYSTEMS: Other than what is noted above, Lauren Buchanan says she is doing fine.  She lives at [facility] assisted living.  She says she's lazy and not really active.  She can't really tell me if she takes naps during the day or not.  She denies any other issues such as fevers, vomiting, new  pain, constipation, abdominal cramping, headaches, or any further concerns.  A detailed ROS was otherwise non contributory.    BREAST CANCER HISTORY: From the original intake note:  "Lauren Buchanan"  Has been aware of a sore right nipple for the last 6-8 months. She attributed it to her washing machine, which rubs that area when she uses it. On  11/21/2014 she saw Dr. 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 She mentioned the chest wall problem and Dr. Ubaldo Glassing obtained to skin biopsies 1 from the right inferior central chest and the other one of from the right mid medial chest.  The second of these showed a nodular basal cell carcinoma. The first one however showed invasive carcinoma consistent with a breast primary. This tumor was estrogen receptor positive at 100%, progesterone receptor positive at 36%, both with strong staining intensity, and HER-2 was amplified , the signals ratio being 6.2. [name] [id number])  On 12/06/2014 the patient underwent bilateral diagnostic mammography with bilateral ultrasonography. Breast density was category B. There was bilateral nipple retraction. In the left breast upper outer quadrant there was a spiculated mass noted by mammography with a second spiculated mass in the lower inner quadrant. By ultrasound there was a 1.6 cm hypoechoic lesion at the 1:30 o'clock position in the left breast, and a second mass more medially measuring 0.9 cm. Also at the 8:00 position in the left breast 2 cm from the nipple there was a 1.3 cm mass. Ultrasound of the left axilla showed an enlarged lymph node with fatty hilum replacement, measuring 1.4 cm.  In the right breast mammography showed a cluster  of indeterminate microcalcifications in the upper outer quadrant and a retroareolar mass, which bile does sound measured 2.9 cm. Ultrasound of the right axilla showed a right axilla lymph node measuring 1.4 cm.  On 12/06/2014 the patient underwent needle core biopsy of 2  separate masses in the left breast (at 1:00 and 8:00) as well as the enlarged axillary lymph node. All 3 were positive for an invasive lobular carcinoma (E-cadherin negative). All 3 tumors were estrogen receptor positive with strong staining intensity (between 87% and 100%). The lymph node was progesterone receptor negative, the other 2 masses being progesterone receptor positive at 9% and at 76%. This cancer was reported to be HER-2 positive at conference today, though I do not have the ratios at hand.  The patient is not an MRI candidate because she has a permanent pacemaker in place. Her subsequent history is as detailed below    PAST MEDICAL HISTORY: Past Medical History:  Diagnosis Date  . Asthma   . Breast cancer ([place]) 12/08/2014   Bilateral  . Cancer of central portion of female breast ([place]) 12/04/2014  . Cancer of central portion of female breast ([place]) 12/04/2014  . Complete heart block ([place]) 1999   s/p PPM by Dr Olevia Perches, most recent generator change 2009  . COPD (chronic obstructive pulmonary disease) ([place])   . Diverticulosis   . DJD (degenerative joint disease)   . Esophageal stricture   . GERD (gastroesophageal reflux disease)   . Giant cell arteritis ([place])   . Goiter   . Hemorrhoids   . Hiatal hernia   . IBS (irritable bowel syndrome)   . Lung cancer ([place])   . Paroxysmal atrial fibrillation ([place]) 12/17/2015   asymptomatic, <1%, determined on regular pacemaker interrogation  . Pneumonia   . Renal cyst   . S/P radiation therapy 04/24/15 completed   T-11  . Wears glasses     PAST SURGICAL HISTORY: Past Surgical History:  Procedure Laterality Date  . ANAL FISSURE REPAIR    . ARTERY BIOPSY Left 01/03/2013   Procedure: BIOPSY LEFT TEMPORAL ARTERY;  Surgeon: Ascencion Dike, MD;  Location: [facility];  Service: ENT;  Laterality: Left;  . BREAST BIOPSY Right   . CHOLECYSTECTOMY    . COLONOSCOPY    . FRACTURE SURGERY     fx lt wrist  . hemorrhoidectomy    . left  lower lobectomy for brochiectasis  1950  . MOUTH SURGERY    . PACEMAKER INSERTION  1999   Gen change ([facility]) by Dr Olevia Perches 2009  . RECTAL POLYPECTOMY    . RENAL CYST EXCISION    . TONSILLECTOMY      FAMILY HISTORY Family History  Problem Relation Age of Onset  . Heart disease Mother   . Breast cancer Mother   . Stomach cancer Father   . Alcohol abuse Father   . Seizures Son   . Thyroid disease Sister    the patient's father died at the age of 45, with stomach cancer. The patient's mother died at the age of 10 from a myocardial infarction. She had been diagnosed with breast cancer in her 39s. The patient had no brothers, 2 sisters. There is no other history of breast or ovarian cancer in the family to the patient's knowledge  GYNECOLOGIC HISTORY:  No LMP recorded. Patient is postmenopausal. Menarche age 59, first live birth age 35. The patient is GX P4. She underwent menopause in her late 44s. She did not take hormone replacement.  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 She is a widow, lives by herself, with no pets. Daughter Wendy Poet lives in [place] where she is Mudlogger of a preschool and day care. Son Kenslie Abbruzzese lives in [place] where he owns a furniture to sign store. Daughter Shelly Rubenstein lives in [place]. She owns a Chiropractor. Son Randall Hiss is physically and mentally disabled and lives in a group home in [place]. The patient has 7 grandchildren and 2 great-grandchildren. She attends a local [employer]: In place; the patient does not wish to be resuscitated in case of a terminal event; the patient's daughter Lauren Buchanan is her healthcare power of attorney. She can be reached at [phone]. The patient has a DO NOT RESUSCITATE order in place at [facility].  HEALTH MAINTENANCE: Social History   Tobacco Use  . Smoking status: Never Smoker  . Smokeless tobacco: Never Used  Substance Use Topics  . Alcohol use:  Yes    Alcohol/week: 0.0 oz    Comment: Occasional   . Drug use: No     Colonoscopy:  PAP:  Bone density:02/19/2015, osteoporosis  Lipid panel:  Allergies  Allergen Reactions  . Morphine And Related Nausea Only  . Amoxicillin-Pot Clavulanate Other (See Comments)    Unknown     Current Outpatient Medications  Medication Sig Dispense Refill  . Calcium Citrate-Vitamin D (CITRACAL PETITES/VITAMIN D) 200-250 MG-UNIT TABS Take 1 tablet by mouth daily.      . capecitabine (XELODA) 500 MG tablet Take 3 tablets (1,500 mg total) by mouth 2 (two) times daily after a meal. Take for 7 days on, 7 days off, repeat. (Patient taking differently: Take 1,500 mg by mouth 2 (two) times daily after a meal. Take for 7 days on, 7 days off, repeat.) 84 tablet 6  . diclofenac sodium (VOLTAREN) 1 % GEL Apply topically 3 (three) times daily as needed.    . lactose free nutrition (BOOST) LIQD Take 237 mLs daily by mouth.    . metoprolol succinate (TOPROL-XL) 100 MG 24 hr tablet TAKE 1/2 (ONE-HALF) TABLET BY MOUTH EVERY MORNING. Take with or immediately following a meal. 45 tablet 3  . predniSONE (DELTASONE) 5 MG tablet Take 5 mg by mouth daily with breakfast. Reported on 11/19/2015    . traMADol (ULTRAM) 50 MG tablet Take 50 mg by mouth every 6 (six) hours as needed.     . TURMERIC PO Take 1 capsule by mouth daily.     No current facility-administered medications for this visit.     OBJECTIVE:   Vitals:   11/19/17 0853  BP: (!) 141/69  Pulse: 90  Resp: 18  Temp: 99 F (37.2 C)  SpO2: 99%     Body mass index is 23.92 kg/m.    ECOG FS:1 - Symptomatic but completely ambulatory GENERAL: Patient is a well appearing older female in no acute distress HEENT:  Sclerae anicteric.  Oropharynx clear and moist. No ulcerations or evidence of oropharyngeal candidiasis. Neck is supple.  NODES:  No cervical, supraclavicular, or axillary lymphadenopathy palpated.  BREAST EXAM:  Deferred. LUNGS:  Clear to  auscultation bilaterally.  No wheezes or rhonchi. HEART:  Regular rate and rhythm. No murmur appreciated. ABDOMEN:  Soft, nontender.  Positive, normoactive bowel sounds. No organomegaly palpated. MSK:  No focal spinal tenderness to palpation.  EXTREMITIES:  No peripheral edema.   SKIN:  Clear with no obvious rashes or  skin changes. No nail dyscrasia. NEURO:  Nonfocal. Well oriented.  Appropriate affect.    LAB RESULTS:  CMP     Component Value Date/Time   NA 139 11/19/2017 0840   NA 136 09/29/2017 1435   K 4.5 11/19/2017 0840   K 4.1 09/29/2017 1435   CL 102 11/19/2017 0840   CO2 28 11/19/2017 0840   CO2 27 09/29/2017 1435   GLUCOSE 161 (H) 11/19/2017 0840   GLUCOSE 148 (H) 09/29/2017 1435   BUN 18 11/19/2017 0840   BUN 14.9 09/29/2017 1435   CREATININE 0.89 11/19/2017 0840   CREATININE 1.0 09/29/2017 1435   CALCIUM 9.4 11/19/2017 0840   CALCIUM 9.3 09/29/2017 1435   PROT 6.7 11/19/2017 0840   PROT 7.0 09/29/2017 1435   ALBUMIN 3.3 (L) 11/19/2017 0840   ALBUMIN 3.6 09/29/2017 1435   AST 36 (H) 11/19/2017 0840   AST 28 09/29/2017 1435   ALT 68 (H) 11/19/2017 0840   ALT 22 09/29/2017 1435   ALKPHOS 105 11/19/2017 0840   ALKPHOS 89 09/29/2017 1435   BILITOT 0.7 11/19/2017 0840   BILITOT 1.12 09/29/2017 1435   GFRNONAA 55 (L) 11/19/2017 0840   GFRAA >60 11/19/2017 0840    INo results found for: SPEP, UPEP  Lab Results  Component Value Date   WBC 7.4 11/19/2017   NEUTROABS 5.9 11/19/2017   HGB 13.7 11/19/2017   HCT 41.6 11/19/2017   MCV 89.6 11/19/2017   PLT 298 11/19/2017      Chemistry      Component Value Date/Time   NA 139 11/19/2017 0840   NA 136 09/29/2017 1435   K 4.5 11/19/2017 0840   K 4.1 09/29/2017 1435   CL 102 11/19/2017 0840   CO2 28 11/19/2017 0840   CO2 27 09/29/2017 1435   BUN 18 11/19/2017 0840   BUN 14.9 09/29/2017 1435   CREATININE 0.89 11/19/2017 0840   CREATININE 1.0 09/29/2017 1435   GLU 94 02/23/2017      Component Value  Date/Time   CALCIUM 9.4 11/19/2017 0840   CALCIUM 9.3 09/29/2017 1435   ALKPHOS 105 11/19/2017 0840   ALKPHOS 89 09/29/2017 1435   AST 36 (H) 11/19/2017 0840   AST 28 09/29/2017 1435   ALT 68 (H) 11/19/2017 0840   ALT 22 09/29/2017 1435   BILITOT 0.7 11/19/2017 0840   BILITOT 1.12 09/29/2017 1435       Lab Results  Component Value Date   LABCA2 20 04/01/2016    No components found for: [id number]  No results for input(s): INR in the last 168 hours.  Urinalysis    Component Value Date/Time   COLORURINE COLORLESS (A) 11/29/2016 1707   APPEARANCEUR CLEAR 11/29/2016 1707   LABSPEC 1.002 (L) 11/29/2016 1707   PHURINE 7.0 11/29/2016 1707   GLUCOSEU NEGATIVE 11/29/2016 1707   HGBUR SMALL (A) 11/29/2016 1707   HGBUR trace-intact 01/17/2010 0932   BILIRUBINUR NEGATIVE 11/29/2016 1707   BILIRUBINUR n 09/01/2011 1157   KETONESUR NEGATIVE 11/29/2016 1707   PROTEINUR NEGATIVE 11/29/2016 1707   UROBILINOGEN 0.2 09/27/2014 1800   NITRITE NEGATIVE 11/29/2016 1707   LEUKOCYTESUR NEGATIVE 11/29/2016 1707    STUDIES: Since her last visit, she completed a PET scan on 10/15/2017 showing: Enlarging, increasingly hypermetabolic bilateral breast masses. Right axillary and subpectoral lymph nodes have not significantly increased in size, but are also slightly more hypermetabolic. No distant metastases. Stable sub solid left lung lesion with mild hypermetabolic activity.  ASSESSMENT: 84 y.o. [place] woman with  bilateral breast cancer, as follows:  (1) status post right breastt overlapping sites skin biopsy 11/21/2014 for a clinical T4 N1, stage IIIB invasive ductal carcinoma, estrogen and progesterone receptor positive, HER-2 amplified  (2) status post 2 separate left breast upper outer quadrant biopsies and one left axillary lymph node biopsy 12/04/2014, all positive for a clinical mT1c N1, stage IIA invasive lobular carcinoma, estrogen receptor positive, progesterone receptor variable,  with MIB-1 between 10 and 40%, and no HER-2 amplification  (3) neoadjuvant anastrozole started 12/14/2014. Discontinued with progression by repeat left breast US May 2017  (4) definitive surgery to consist of bilateral mastectomies without formal axillary dissection: Postponed  METASTATIC DISEASE: March 2016 (note: R breast CA is HER-2 positive, L is negative) (5) PET scan 12/19/2014 shows in addition to the bilateral breast and bilateral axillary masses, a lesion at the T11 vertebral body, showing an irregular lucency on prior CT exam  (a) adjuvant radiationt to T10-T12: 04/11/2015 through 04/24/2015 to a total dose of 30 gray in 10 fractions  (6) consider anti-HER-2 treatment depending on response to antiestrogen therapy alone.    (7) osteoporosis with T score of -2.6 on bone scan obtained 02/19/2015.  (a) zolendronate started 03/20/2015, repeated every 12 weeks   (8) fulvestrant started 03/04/16--discontinued January 2019 with progression  (a) patient refused adding palbociclib  (b) PET scan in 10/15/2016 showed mild increase in the right breast mass and increased size and activity of 3.2 cm of solid left lung nodule  (c) letrozole added 10/16/2016--discontinued January 2019  (d) PET scan obtained 02/10/2017 showing a significant decrease in SUV in the measurable disease  (e) PET scan 10/15/2017 showing increase in bilateral breast and right axillary adenopathy; left lung lesion is stable  (9) Doppler ultrasound both lower extremities 11/17/2016 and repeat 11/29/2016 are  Negative  (10) to start trastuzumab 11/12/2017, repeated every 2 weeks  (11) to start capecitabine 11/15/2017, received 1 week on 1 week off  (a) initial dose will be 1000 mg twice daily  PLAN: Due to the fatigue, achiness, and more importantly diarrhea, Lauren Buchanan will stop taking the Capecitabine for the next week.  I have written the appropriate orders for her assisted living facility.  She will return in one week and we  will evaluate whether or not to restart, and the dose to restart.  She met with Dr. Jana Hakim today as well who reviewed this with her as well.  Her labs today look good and I reviewed those with her daughter and her in detail.  She and her daughter know to contact us for any questions or concerns prior to her next appointment with [place].   Wilber Bihari, NP  11/19/17 10:23 AM Medical Oncology and Hematology [facility] [address] [place], [place] [address] Tel. [phone]    Fax. [phone]   ADDENDUM: Lauren Buchanan looks well, but objectively she is 2 pounds down.  Because of her short-term memory loss it is really difficult to know how well she is tolerating the capecitabine, but I think we do not need to take a chance here so we are going to stop the capecitabine for now.  When we restarted, likely week from now, we will do 2 tablets in the morning only and see how she does with that.  It may be that the only thing to do here is to proceed to surgery for local control.  I am hoping to be able to avoid that.  I personally saw this patient and performed  a substantive portion of this encounter with the listed APP documented above.   Bobetta Lime, MD Medical Oncology and Hematology [facility] [address] [place], [place] [address] Tel. [phone]    Fax. [phone]

## 2017-11-25 ENCOUNTER — Ambulatory Visit: Payer: Medicare Other | Admitting: Neurology

## 2017-11-25 NOTE — Progress Notes (Signed)
[facility] [place]  [phone] [phone]   ID: [id number] CUSTER DOB: 31-Dec-1924  MR#: [id number]  GYI#:[id number]  Patient Care Team: Blanchie Serve, MD as PCP - General (Internal Medicine) Fanny Skates, MD as Consulting Physician (General Surgery) [name], [name], MD as Consulting Physician (Oncology) [name], MD as Consulting Physician (Radiation Oncology) Rockwell Germany, RN as Registered Nurse Mauro Kaufmann, RN as Registered Nurse Gentry Fitz, MD as Consulting Physician (Family Medicine) [name], [name], NP as Nurse Practitioner (Internal Medicine) Thompson Grayer, MD as Consulting Physician (Cardiology) PCP: Blanchie Serve, MD GYN: OTHER MD: Danton Sewer MD, Thompson Grayer MD, Unice Bailey MD, Rolm Bookbinder MD  CHIEF COMPLAINT: bilateral estrogen receptor positive breast cancer, right-sided HER-2 positive breast cancer,  bone metastases  CURRENT TREATMENT: zolendronate, capecitabine, trastuzumab  NOTE: Patient requests her daughter [name] be contacted regarding all appointments and procedures. [name] can be reached at [phone]  INTERVAL HISTORY: Dot returns today for follow-up and evaluation accompanied by her daughter [name].   Dot started on Capecitabine 2 tabs BID on 1/28, her daughter then called a few days later stating that she was more fatigued, achy, and having some mild diarrhea.  The capecitabine was stopped, Dot says she is feeling about the same, her daughter however, hasn't noted any complaints like she got the week prior.    She also receives Trastuzumab every two weeks, with a dose due today.  Her last echocardiogram was on 11/02/2017, showing an ejection fraction in the 50-55% range.  REVIEW OF SYSTEMS: Dot continues to do moderately well.  She denies any new pain.  She tells me that she is eating and drinking well.  She continues to have occasional loose stools.  Otherwise, a detailed ROS is non contributory.    BREAST  CANCER HISTORY: From the original intake note:  "Dot"  Has been aware of a sore right nipple for the last 6-8 months. She attributed it to her washing machine, which rubs that area when she uses it. On  11/21/2014 she saw Dr. 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 She mentioned the chest wall problem and Dr. Ubaldo Glassing obtained to skin biopsies 1 from the right inferior central chest and the other one of from the right mid medial chest.  The second of these showed a nodular basal cell carcinoma. The first one however showed invasive carcinoma consistent with a breast primary. This tumor was estrogen receptor positive at 100%, progesterone receptor positive at 36%, both with strong staining intensity, and HER-2 was amplified , the signals ratio being 6.2. [name] [id number])  On 12/06/2014 the patient underwent bilateral diagnostic mammography with bilateral ultrasonography. Breast density was category B. There was bilateral nipple retraction. In the left breast upper outer quadrant there was a spiculated mass noted by mammography with a second spiculated mass in the lower inner quadrant. By ultrasound there was a 1.6 cm hypoechoic lesion at the 1:30 o'clock position in the left breast, and a second mass more medially measuring 0.9 cm. Also at the 8:00 position in the left breast 2 cm from the nipple there was a 1.3 cm mass. Ultrasound of the left axilla showed an enlarged lymph node with fatty hilum replacement, measuring 1.4 cm.  In the right breast mammography showed a cluster of indeterminate microcalcifications in the upper outer quadrant and a retroareolar mass, which bile does sound measured 2.9 cm. Ultrasound of the right axilla showed a right axilla lymph  node measuring 1.4 cm.  On 12/06/2014 the patient underwent needle core biopsy of 2 separate masses in the left breast (at 1:00 and 8:00) as well as the enlarged axillary lymph node. All 3 were positive for an invasive  lobular carcinoma (E-cadherin negative). All 3 tumors were estrogen receptor positive with strong staining intensity (between 87% and 100%). The lymph node was progesterone receptor negative, the other 2 masses being progesterone receptor positive at 9% and at 76%. This cancer was reported to be HER-2 positive at conference today, though I do not have the ratios at hand.  The patient is not an MRI candidate because she has a permanent pacemaker in place. Her subsequent history is as detailed below    PAST MEDICAL HISTORY: Past Medical History:  Diagnosis Date  . Asthma   . Breast cancer ([place]) 12/08/2014   Bilateral  . Cancer of central portion of female breast ([place]) 12/04/2014  . Cancer of central portion of female breast ([place]) 12/04/2014  . Complete heart block ([place]) 1999   s/p PPM by Dr Olevia Perches, most recent generator change 2009  . COPD (chronic obstructive pulmonary disease) ([place])   . Diverticulosis   . DJD (degenerative joint disease)   . Esophageal stricture   . GERD (gastroesophageal reflux disease)   . Giant cell arteritis ([place])   . Goiter   . Hemorrhoids   . Hiatal hernia   . IBS (irritable bowel syndrome)   . Lung cancer ([place])   . Paroxysmal atrial fibrillation ([place]) 12/17/2015   asymptomatic, <1%, determined on regular pacemaker interrogation  . Pneumonia   . Renal cyst   . S/P radiation therapy 04/24/15 completed   T-11  . Wears glasses     PAST SURGICAL HISTORY: Past Surgical History:  Procedure Laterality Date  . ANAL FISSURE REPAIR    . ARTERY BIOPSY Left 01/03/2013   Procedure: BIOPSY LEFT TEMPORAL ARTERY;  Surgeon: Ascencion Dike, MD;  Location: [facility];  Service: ENT;  Laterality: Left;  . BREAST BIOPSY Right   . CHOLECYSTECTOMY    . COLONOSCOPY    . FRACTURE SURGERY     fx lt wrist  . hemorrhoidectomy    . left lower lobectomy for brochiectasis  1950  . MOUTH SURGERY    . PACEMAKER INSERTION  1999   Gen change ([facility]) by Dr Olevia Perches 2009  .  RECTAL POLYPECTOMY    . RENAL CYST EXCISION    . TONSILLECTOMY      FAMILY HISTORY Family History  Problem Relation Age of Onset  . Heart disease Mother   . Breast cancer Mother   . Stomach cancer Father   . Alcohol abuse Father   . Seizures Son   . Thyroid disease Sister    the patient's father died at the age of 67, with stomach cancer. The patient's mother died at the age of 90 from a myocardial infarction. She had been diagnosed with breast cancer in her 14s. The patient had no brothers, 2 sisters. There is no other history of breast or ovarian cancer in the family to the patient's knowledge  GYNECOLOGIC HISTORY:  No LMP recorded. Patient is postmenopausal. Menarche age 44, first live birth age 13. The patient is GX P4. She underwent menopause in her late 95s. She did not take hormone replacement.  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 She is a widow, lives by herself, with no pets. Daughter Wendy Poet lives  in [place] where she is Mudlogger of a preschool and day care. Son Iness Pangilinan lives in [place] where he owns a furniture to sign store. Daughter Shelly Rubenstein lives in [place]. She owns a Chiropractor. Son Randall Hiss is physically and mentally disabled and lives in a group home in [place]. The patient has 7 grandchildren and 2 great-grandchildren. She attends a local [employer]: In place; the patient does not wish to be resuscitated in case of a terminal event; the patient's daughter [name] is her healthcare power of attorney. She can be reached at [phone]. The patient has a DO NOT RESUSCITATE order in place at [facility].  HEALTH MAINTENANCE: Social History   Tobacco Use  . Smoking status: Never Smoker  . Smokeless tobacco: Never Used  Substance Use Topics  . Alcohol use: Yes    Alcohol/week: 0.0 oz    Comment: Occasional   . Drug use: No     Colonoscopy:  PAP:  Bone density:02/19/2015,  osteoporosis  Lipid panel:  Allergies  Allergen Reactions  . Morphine And Related Nausea Only  . Amoxicillin-Pot Clavulanate Other (See Comments)    Unknown     Current Outpatient Medications  Medication Sig Dispense Refill  . Calcium Citrate-Vitamin D (CITRACAL PETITES/VITAMIN D) 200-250 MG-UNIT TABS Take 1 tablet by mouth daily.      . capecitabine (XELODA) 500 MG tablet Take 3 tablets (1,500 mg total) by mouth 2 (two) times daily after a meal. Take for 7 days on, 7 days off, repeat. (Patient taking differently: Take 1,500 mg by mouth 2 (two) times daily after a meal. Take for 7 days on, 7 days off, repeat.) 84 tablet 6  . diclofenac sodium (VOLTAREN) 1 % GEL Apply topically 3 (three) times daily as needed.    . lactose free nutrition (BOOST) LIQD Take 237 mLs daily by mouth.    . metoprolol succinate (TOPROL-XL) 100 MG 24 hr tablet TAKE 1/2 (ONE-HALF) TABLET BY MOUTH EVERY MORNING. Take with or immediately following a meal. 45 tablet 3  . predniSONE (DELTASONE) 5 MG tablet Take 5 mg by mouth daily with breakfast. Reported on 11/19/2015    . traMADol (ULTRAM) 50 MG tablet Take 50 mg by mouth every 6 (six) hours as needed.     . TURMERIC PO Take 1 capsule by mouth daily.     No current facility-administered medications for this visit.     OBJECTIVE:   Vitals:   11/26/17 1337  BP: (!) 152/62  Pulse: 77  Resp: 16  Temp: 98.3 F (36.8 C)  SpO2: 100%     Body mass index is 24.2 kg/m.    ECOG FS:1 - Symptomatic but completely ambulatory GENERAL: Patient is a well appearing older female in no acute distress HEENT:  Sclerae anicteric.  Oropharynx clear and moist. No ulcerations or evidence of oropharyngeal candidiasis. Neck is supple.  NODES:  No cervical, supraclavicular, or axillary lymphadenopathy palpated.  BREAST EXAM:  Deferred. LUNGS:  Clear to auscultation bilaterally.  No wheezes or rhonchi. HEART:  Regular rate and rhythm. No murmur appreciated. ABDOMEN:  Soft, nontender.   Positive, normoactive bowel sounds. No organomegaly palpated. MSK:  No focal spinal tenderness to palpation.  EXTREMITIES:  No peripheral edema.   SKIN:  Clear with no obvious rashes or skin changes. No nail dyscrasia. NEURO:  Nonfocal. Well oriented.  Appropriate affect.    LAB RESULTS:  CMP     Component Value Date/Time  NA 138 11/26/2017 1314   NA 136 09/29/2017 1435   K 4.2 11/26/2017 1314   K 4.1 09/29/2017 1435   CL 103 11/26/2017 1314   CO2 27 11/26/2017 1314   CO2 27 09/29/2017 1435   GLUCOSE 109 11/26/2017 1314   GLUCOSE 148 (H) 09/29/2017 1435   BUN 20 11/26/2017 1314   BUN 14.9 09/29/2017 1435   CREATININE 0.83 11/26/2017 1314   CREATININE 1.0 09/29/2017 1435   CALCIUM 10.2 11/26/2017 1314   CALCIUM 9.3 09/29/2017 1435   PROT 6.4 11/26/2017 1314   PROT 7.0 09/29/2017 1435   ALBUMIN 3.4 (L) 11/26/2017 1314   ALBUMIN 3.6 09/29/2017 1435   AST 33 11/26/2017 1314   AST 28 09/29/2017 1435   ALT 37 11/26/2017 1314   ALT 22 09/29/2017 1435   ALKPHOS 98 11/26/2017 1314   ALKPHOS 89 09/29/2017 1435   BILITOT 0.7 11/26/2017 1314   BILITOT 1.12 09/29/2017 1435   GFRNONAA >60 11/26/2017 1314   GFRAA >60 11/26/2017 1314    INo results found for: SPEP, UPEP  Lab Results  Component Value Date   WBC 9.4 11/26/2017   NEUTROABS 7.9 (H) 11/26/2017   HGB 12.8 11/26/2017   HCT 38.9 11/26/2017   MCV 88.8 11/26/2017   PLT 268 11/26/2017      Chemistry      Component Value Date/Time   NA 138 11/26/2017 1314   NA 136 09/29/2017 1435   K 4.2 11/26/2017 1314   K 4.1 09/29/2017 1435   CL 103 11/26/2017 1314   CO2 27 11/26/2017 1314   CO2 27 09/29/2017 1435   BUN 20 11/26/2017 1314   BUN 14.9 09/29/2017 1435   CREATININE 0.83 11/26/2017 1314   CREATININE 1.0 09/29/2017 1435   GLU 94 02/23/2017      Component Value Date/Time   CALCIUM 10.2 11/26/2017 1314   CALCIUM 9.3 09/29/2017 1435   ALKPHOS 98 11/26/2017 1314   ALKPHOS 89 09/29/2017 1435   AST 33  11/26/2017 1314   AST 28 09/29/2017 1435   ALT 37 11/26/2017 1314   ALT 22 09/29/2017 1435   BILITOT 0.7 11/26/2017 1314   BILITOT 1.12 09/29/2017 1435       Lab Results  Component Value Date   LABCA2 20 04/01/2016    No components found for: [id number]  No results for input(s): INR in the last 168 hours.  Urinalysis    Component Value Date/Time   COLORURINE COLORLESS (A) 11/29/2016 1707   APPEARANCEUR CLEAR 11/29/2016 1707   LABSPEC 1.002 (L) 11/29/2016 1707   PHURINE 7.0 11/29/2016 1707   GLUCOSEU NEGATIVE 11/29/2016 1707   HGBUR SMALL (A) 11/29/2016 1707   HGBUR trace-intact 01/17/2010 0932   BILIRUBINUR NEGATIVE 11/29/2016 1707   BILIRUBINUR n 09/01/2011 1157   KETONESUR NEGATIVE 11/29/2016 1707   PROTEINUR NEGATIVE 11/29/2016 1707   UROBILINOGEN 0.2 09/27/2014 1800   NITRITE NEGATIVE 11/29/2016 1707   LEUKOCYTESUR NEGATIVE 11/29/2016 1707    STUDIES: Since her last visit, she completed a PET scan on 10/15/2017 showing: Enlarging, increasingly hypermetabolic bilateral breast masses. Right axillary and subpectoral lymph nodes have not significantly increased in size, but are also slightly more hypermetabolic. No distant metastases. Stable sub solid left lung lesion with mild hypermetabolic activity.  ASSESSMENT: 84 y.o. [place] woman with bilateral breast cancer, as follows:  (1) status post right breastt overlapping sites skin biopsy 11/21/2014 for a clinical T4 N1, stage IIIB invasive ductal carcinoma, estrogen and progesterone receptor positive, HER-2 amplified  (  2) status post 2 separate left breast upper outer quadrant biopsies and one left axillary lymph node biopsy 12/04/2014, all positive for a clinical mT1c N1, stage IIA invasive lobular carcinoma, estrogen receptor positive, progesterone receptor variable, with MIB-1 between 10 and 40%, and no HER-2 amplification  (3) neoadjuvant anastrozole started 12/14/2014. Discontinued with progression by repeat left  breast US May 2017  (4) definitive surgery to consist of bilateral mastectomies without formal axillary dissection: Postponed  METASTATIC DISEASE: March 2016 (note: R breast CA is HER-2 positive, L is negative) (5) PET scan 12/19/2014 shows in addition to the bilateral breast and bilateral axillary masses, a lesion at the T11 vertebral body, showing an irregular lucency on prior CT exam  (a) adjuvant radiationt to T10-T12: 04/11/2015 through 04/24/2015 to a total dose of 30 gray in 10 fractions  (6) consider anti-HER-2 treatment depending on response to antiestrogen therapy alone.    (7) osteoporosis with T score of -2.6 on bone scan obtained 02/19/2015.  (a) zolendronate started 03/20/2015, repeated every 12 weeks   (8) fulvestrant started 03/04/16--discontinued January 2019 with progression  (a) patient refused adding palbociclib  (b) PET scan in 10/15/2016 showed mild increase in the right breast mass and increased size and activity of 3.2 cm of solid left lung nodule  (c) letrozole added 10/16/2016--discontinued January 2019  (d) PET scan obtained 02/10/2017 showing a significant decrease in SUV in the measurable disease  (e) PET scan 10/15/2017 showing increase in bilateral breast and right axillary adenopathy; left lung lesion is stable  (9) Doppler ultrasound both lower extremities 11/17/2016 and repeat 11/29/2016 are  Negative  (10) to start trastuzumab 11/12/2017, repeated every 2 weeks  (11) to start capecitabine 11/15/2017, received 1 week on 1 week off  (a) initial dose will be 1000 mg twice daily  (b) dose reduced to start on 2/11 at '1000mg'$  daily due to questionable tolerance issues  PLAN: Dot is doing well today.  She will proceed with Trastuzumab today.  I wrote for her to start back on Capecitabine at two tablets daily, 7 days on and 7 days off.  I gave a written order for her assisted living facility.  She will proceed with Trastuzumab today.  She will return in 2 weeks  for labs and f/u with Dr. Jana Hakim.  She and her daughter know to contact us for any questions or concerns prior to her next appointment with [place].  A total of (20) minutes of face-to-face time was spent with this patient with greater than 50% of that time in counseling and care-coordination.   Wilber Bihari, NP  11/26/17 2:37 PM Medical Oncology and Hematology [facility] [address] [place], [place] [address] Tel. [phone]    Fax. [phone]

## 2017-11-26 ENCOUNTER — Encounter: Payer: Self-pay | Admitting: Adult Health

## 2017-11-26 ENCOUNTER — Inpatient Hospital Stay: Payer: Medicare Other

## 2017-11-26 ENCOUNTER — Inpatient Hospital Stay (HOSPITAL_BASED_OUTPATIENT_CLINIC_OR_DEPARTMENT_OTHER): Payer: Medicare Other | Admitting: Adult Health

## 2017-11-26 VITALS — BP 152/62 | HR 77 | Temp 98.3°F | Resp 16 | Ht 60.0 in | Wt 123.9 lb

## 2017-11-26 DIAGNOSIS — Z950 Presence of cardiac pacemaker: Secondary | ICD-10-CM

## 2017-11-26 DIAGNOSIS — C7951 Secondary malignant neoplasm of bone: Secondary | ICD-10-CM | POA: Diagnosis not present

## 2017-11-26 DIAGNOSIS — I480 Paroxysmal atrial fibrillation: Secondary | ICD-10-CM

## 2017-11-26 DIAGNOSIS — Z79899 Other long term (current) drug therapy: Secondary | ICD-10-CM

## 2017-11-26 DIAGNOSIS — C50919 Malignant neoplasm of unspecified site of unspecified female breast: Secondary | ICD-10-CM

## 2017-11-26 DIAGNOSIS — Z170 Estrogen receptor positive status [ER+]: Principal | ICD-10-CM

## 2017-11-26 DIAGNOSIS — J449 Chronic obstructive pulmonary disease, unspecified: Secondary | ICD-10-CM

## 2017-11-26 DIAGNOSIS — M810 Age-related osteoporosis without current pathological fracture: Secondary | ICD-10-CM

## 2017-11-26 DIAGNOSIS — C50412 Malignant neoplasm of upper-outer quadrant of left female breast: Secondary | ICD-10-CM

## 2017-11-26 DIAGNOSIS — R918 Other nonspecific abnormal finding of lung field: Secondary | ICD-10-CM

## 2017-11-26 DIAGNOSIS — C50911 Malignant neoplasm of unspecified site of right female breast: Secondary | ICD-10-CM | POA: Diagnosis not present

## 2017-11-26 DIAGNOSIS — R197 Diarrhea, unspecified: Secondary | ICD-10-CM | POA: Diagnosis not present

## 2017-11-26 DIAGNOSIS — Z923 Personal history of irradiation: Secondary | ICD-10-CM

## 2017-11-26 DIAGNOSIS — C773 Secondary and unspecified malignant neoplasm of axilla and upper limb lymph nodes: Secondary | ICD-10-CM | POA: Diagnosis not present

## 2017-11-26 DIAGNOSIS — C50811 Malignant neoplasm of overlapping sites of right female breast: Secondary | ICD-10-CM

## 2017-11-26 DIAGNOSIS — Z5112 Encounter for antineoplastic immunotherapy: Secondary | ICD-10-CM | POA: Diagnosis not present

## 2017-11-26 DIAGNOSIS — Z802 Family history of malignant neoplasm of other respiratory and intrathoracic organs: Secondary | ICD-10-CM

## 2017-11-26 DIAGNOSIS — K219 Gastro-esophageal reflux disease without esophagitis: Secondary | ICD-10-CM

## 2017-11-26 DIAGNOSIS — M1990 Unspecified osteoarthritis, unspecified site: Secondary | ICD-10-CM

## 2017-11-26 DIAGNOSIS — C50912 Malignant neoplasm of unspecified site of left female breast: Secondary | ICD-10-CM

## 2017-11-26 DIAGNOSIS — Z1501 Genetic susceptibility to malignant neoplasm of breast: Secondary | ICD-10-CM

## 2017-11-26 LAB — COMPREHENSIVE METABOLIC PANEL
ALT: 37 U/L (ref 0–55)
ANION GAP: 8 (ref 3–11)
AST: 33 U/L (ref 5–34)
Albumin: 3.4 g/dL — ABNORMAL LOW (ref 3.5–5.0)
Alkaline Phosphatase: 98 U/L (ref 40–150)
BUN: 20 mg/dL (ref 7–26)
CHLORIDE: 103 mmol/L (ref 98–109)
CO2: 27 mmol/L (ref 22–29)
CREATININE: 0.83 mg/dL (ref 0.60–1.10)
Calcium: 10.2 mg/dL (ref 8.4–10.4)
Glucose, Bld: 109 mg/dL (ref 70–140)
POTASSIUM: 4.2 mmol/L (ref 3.5–5.1)
SODIUM: 138 mmol/L (ref 136–145)
Total Bilirubin: 0.7 mg/dL (ref 0.2–1.2)
Total Protein: 6.4 g/dL (ref 6.4–8.3)

## 2017-11-26 LAB — CBC WITH DIFFERENTIAL/PLATELET
Basophils Absolute: 0 10*3/uL (ref 0.0–0.1)
Basophils Relative: 0 %
EOS ABS: 0.1 10*3/uL (ref 0.0–0.5)
Eosinophils Relative: 1 %
HEMATOCRIT: 38.9 % (ref 34.8–46.6)
HEMOGLOBIN: 12.8 g/dL (ref 11.6–15.9)
LYMPHS ABS: 0.6 10*3/uL — AB (ref 0.9–3.3)
Lymphocytes Relative: 7 %
MCH: 29.3 pg (ref 25.1–34.0)
MCHC: 33 g/dL (ref 31.5–36.0)
MCV: 88.8 fL (ref 79.5–101.0)
MONOS PCT: 8 %
Monocytes Absolute: 0.8 10*3/uL (ref 0.1–0.9)
NEUTROS PCT: 84 %
Neutro Abs: 7.9 10*3/uL — ABNORMAL HIGH (ref 1.5–6.5)
Platelets: 268 10*3/uL (ref 145–400)
RBC: 4.38 MIL/uL (ref 3.70–5.45)
RDW: 15.2 % — ABNORMAL HIGH (ref 11.2–14.5)
WBC: 9.4 10*3/uL (ref 3.9–10.3)

## 2017-11-26 MED ORDER — SODIUM CHLORIDE 0.9 % IV SOLN
Freq: Once | INTRAVENOUS | Status: AC
  Administered 2017-11-26: 15:00:00 via INTRAVENOUS

## 2017-11-26 MED ORDER — DIPHENHYDRAMINE HCL 25 MG PO CAPS
ORAL_CAPSULE | ORAL | Status: AC
  Filled 2017-11-26: qty 1

## 2017-11-26 MED ORDER — ACETAMINOPHEN 325 MG PO TABS
650.0000 mg | ORAL_TABLET | Freq: Once | ORAL | Status: AC
  Administered 2017-11-26: 650 mg via ORAL

## 2017-11-26 MED ORDER — DIPHENHYDRAMINE HCL 25 MG PO CAPS
25.0000 mg | ORAL_CAPSULE | Freq: Once | ORAL | Status: AC
  Administered 2017-11-26: 25 mg via ORAL

## 2017-11-26 MED ORDER — TRASTUZUMAB CHEMO 150 MG IV SOLR
4.0000 mg/kg | Freq: Once | INTRAVENOUS | Status: AC
  Administered 2017-11-26: 231 mg via INTRAVENOUS
  Filled 2017-11-26: qty 11

## 2017-11-26 MED ORDER — ACETAMINOPHEN 325 MG PO TABS
ORAL_TABLET | ORAL | Status: AC
  Filled 2017-11-26: qty 2

## 2017-11-26 NOTE — Patient Instructions (Signed)
[facility] Discharge Instructions for Patients Receiving Chemotherapy  Today you received the following chemotherapy agents:  Herceptin (trastuzumab)  To help prevent nausea and vomiting after your treatment, we encourage you to take your nausea medication as prescribed.   If you develop nausea and vomiting that is not controlled by your nausea medication, call the clinic.   BELOW ARE SYMPTOMS THAT SHOULD BE REPORTED IMMEDIATELY:  *FEVER GREATER THAN 100.5 F  *CHILLS WITH OR WITHOUT FEVER  NAUSEA AND VOMITING THAT IS NOT CONTROLLED WITH YOUR NAUSEA MEDICATION  *UNUSUAL SHORTNESS OF BREATH  *UNUSUAL BRUISING OR BLEEDING  TENDERNESS IN MOUTH AND THROAT WITH OR WITHOUT PRESENCE OF ULCERS  *URINARY PROBLEMS  *BOWEL PROBLEMS  UNUSUAL RASH Items with * indicate a potential emergency and should be followed up as soon as possible.  Feel free to call the clinic should you have any questions or concerns. The clinic phone number is [phone].  Please show the [place] at check-in to the Emergency Department and triage nurse.

## 2017-11-30 ENCOUNTER — Encounter: Payer: Self-pay | Admitting: Internal Medicine

## 2017-11-30 NOTE — Patient Instructions (Signed)
Opened in error

## 2017-11-30 NOTE — Progress Notes (Signed)
Opened in error

## 2017-12-02 ENCOUNTER — Encounter: Payer: Self-pay | Admitting: Internal Medicine

## 2017-12-02 ENCOUNTER — Non-Acute Institutional Stay: Payer: Medicare Other | Admitting: Internal Medicine

## 2017-12-02 DIAGNOSIS — M810 Age-related osteoporosis without current pathological fracture: Secondary | ICD-10-CM | POA: Diagnosis not present

## 2017-12-02 DIAGNOSIS — M316 Other giant cell arteritis: Secondary | ICD-10-CM

## 2017-12-02 DIAGNOSIS — C50919 Malignant neoplasm of unspecified site of unspecified female breast: Secondary | ICD-10-CM

## 2017-12-02 DIAGNOSIS — I471 Supraventricular tachycardia: Secondary | ICD-10-CM | POA: Diagnosis not present

## 2017-12-02 DIAGNOSIS — E049 Nontoxic goiter, unspecified: Secondary | ICD-10-CM

## 2017-12-02 DIAGNOSIS — C7951 Secondary malignant neoplasm of bone: Secondary | ICD-10-CM | POA: Diagnosis not present

## 2017-12-02 NOTE — Progress Notes (Signed)
Provider: Blanchie Serve MD   Location:  [facility] Room Number: 786 Place of Service:  ALF (13)  PCP: Blanchie Serve, MD Patient Care Team: Blanchie Serve, MD as PCP - General (Internal Medicine) Lauren Skates, MD as Consulting Physician (General Surgery) [name], [name], MD as Consulting Physician (Oncology) [name], MD as Consulting Physician (Radiation Oncology) Rockwell Germany, RN as Registered Nurse Mauro Kaufmann, RN as Registered Nurse Gentry Fitz, MD as Consulting Physician (Family Medicine) [name], [name], NP as Nurse Practitioner (Internal Medicine) Thompson Grayer, MD as Consulting Physician (Cardiology)  Extended Emergency Contact Information Primary Emergency Contact: Matthews,[name] Address: [address]          [place], [place] [address] [place] Phone: [phone] Relation: Daughter Secondary Emergency Contact: Clent Demark [place] Phone: [phone] Relation: Other  Code Status: DNR  Goals of Care: Advanced Directive information Advanced Directives 12/02/2017  Does Patient Have a Medical Advance Directive? Yes  Type of Paramedic of [place];Out of facility DNR (pink MOST or yellow form)  Does patient want to make changes to medical advance directive? No - Patient declined  Copy of [place] in Chart? Yes  Would patient like information on creating a medical advance directive? -  Pre-existing out of facility DNR order (yellow form or pink MOST form) Yellow form placed in chart (order not valid for inpatient use)      Chief Complaint  Patient presents with  . Medical Management of Chronic Issues    Routine Visit     HPI: Patient is a 84 y.o. female seen today for routine visit. She is seen with the charge nurse present. She denies any concern this visit. She had a good lunch.   Past Medical History:  Diagnosis Date    . Asthma   . Breast cancer ([facility]) 12/08/2014   Bilateral  . Cancer of central portion of female breast ([facility]) 12/04/2014  . Cancer of central portion of female breast ([facility]) 12/04/2014  . Complete heart block ([facility]) 1999   s/p PPM by Dr Olevia Perches, most recent generator change 2009  . COPD (chronic obstructive pulmonary disease) ([facility])   . Diverticulosis   . DJD (degenerative joint disease)   . Esophageal stricture   . GERD (gastroesophageal reflux disease)   . Giant cell arteritis ([facility])   . Goiter   . Hemorrhoids   . Hiatal hernia   . IBS (irritable bowel syndrome)   . Lung cancer ([facility])   . Paroxysmal atrial fibrillation ([facility]) 12/17/2015   asymptomatic, <1%, determined on regular pacemaker interrogation  . Pneumonia   . Renal cyst   . S/P radiation therapy 04/24/15 completed   T-11  . Wears glasses    Past Surgical History:  Procedure Laterality Date  . ANAL FISSURE REPAIR    . ARTERY BIOPSY Left 01/03/2013   Procedure: BIOPSY LEFT TEMPORAL ARTERY;  Surgeon: Ascencion Dike, MD;  Location: [facility];  Service: ENT;  Laterality: Left;  . BREAST BIOPSY Right   . CHOLECYSTECTOMY    . COLONOSCOPY    . FRACTURE SURGERY     fx lt wrist  . hemorrhoidectomy    . left lower lobectomy for brochiectasis  1950  . MOUTH SURGERY    . PACEMAKER INSERTION  1999   Gen change ([facility]) by Dr Olevia Perches 2009  . RECTAL POLYPECTOMY    . RENAL  CYST EXCISION    . TONSILLECTOMY      reports that  has never smoked. she has never used smokeless tobacco. She reports that she drinks alcohol. She reports that she does not use drugs. Social History   Socioeconomic History  . Marital status: Married    Spouse name: Not on file  . Number of children: 4  . Years of education: Not on file  . Highest education level: Not on file  Social Needs  . Financial resource strain: Not on file  . Food insecurity - worry: Not on file  . Food insecurity - inability: Not on file  . Transportation needs - medical:  Not on file  . Transportation needs - non-medical: Not on file  Occupational History  . Occupation: Retired  Tobacco Use  . Smoking status: Never Smoker  . Smokeless tobacco: Never Used  Substance and Sexual Activity  . Alcohol use: Yes    Alcohol/week: 0.0 oz    Comment: Occasional   . Drug use: No  . Sexual activity: Not Currently  Other Topics Concern  . Not on file  Social History Narrative   Social History     Marital status: Widowed          Spouse name:                        Years of education:                 Number of children: 4           Occupational History   Occupation: Copywriter, advertising                Retired : Yes                                      Social History Main Topics     Smoking status: Never Smoker                                                                  Smokeless tobacco: Never Used                         Alcohol use:         0.0 oz/week        Comment:      Drug use: No               Sexual activity:        Other Topics            Concern     None on file      Social History Narrative     Do you drink/eat things with caffeine?    Yes     Marital status: Widowed.  What year were you married? 1948     Do you have a living will? Yes     Do you have DNR form? Yes     Do you have signed POA/HPOA forms? Yes  Functional Status Survey:    Family History  Problem Relation Age of Onset  . Heart disease Mother   . Breast cancer Mother   . Stomach cancer Father   . Alcohol abuse Father   . Seizures Son   . Thyroid disease Sister     Health Maintenance  Topic Date Due  . TETANUS/TDAP  07/24/2017  . INFLUENZA VACCINE  Completed  . DEXA SCAN  Completed  . PNA vac Low Risk Adult  Completed    Allergies  Allergen Reactions  . Morphine And Related Nausea Only  . Amoxicillin-Pot Clavulanate Other (See Comments)    Unknown     Outpatient Encounter Medications as of 12/02/2017  Medication  Sig  . Calcium Citrate-Vitamin D (CITRACAL PETITES/VITAMIN D) 200-250 MG-UNIT TABS Take 1 tablet by mouth daily.    . capecitabine (XELODA) 500 MG tablet Take 1,000 mg/m2 by mouth daily. Give 2 500 mg tablets( 1,000mg ) by mouth every morning x 7 days, then off x 7 days, then reapeat  . diclofenac sodium (VOLTAREN) 1 % GEL Apply topically 3 (three) times daily as needed.  . lactose free nutrition (BOOST) LIQD Take 237 mLs daily by mouth.  . metoprolol succinate (TOPROL-XL) 50 MG 24 hr tablet Take 50 mg by mouth daily. Take with or immediately following a meal.  . predniSONE (DELTASONE) 5 MG tablet Take 5 mg by mouth daily with breakfast. Reported on 11/19/2015  . traMADol (ULTRAM) 50 MG tablet Take 50 mg by mouth every 6 (six) hours as needed.   . TURMERIC PO Take 500 mg by mouth daily.   . [DISCONTINUED] loratadine (CLARITIN) 10 MG tablet Take 10 mg by mouth daily.     No facility-administered encounter medications on file as of 12/02/2017.     Review of Systems  Constitutional: Negative for appetite change and fever.  HENT: Negative for congestion, hearing loss, sinus pressure, sinus pain and trouble swallowing.   Respiratory: Negative for cough and shortness of breath.   Cardiovascular: Negative for chest pain, palpitations and leg swelling.  Gastrointestinal: Negative for abdominal pain, constipation, nausea and vomiting.  Genitourinary: Negative for dysuria and hematuria.  Musculoskeletal: Negative for arthralgias, back pain and gait problem.  Neurological: Negative for dizziness and headaches.  Psychiatric/Behavioral: Negative for behavioral problems and confusion.    Vitals:   12/02/17 1431  BP: 124/70  Pulse: 74  Resp: 18  Temp: 98.5 F (36.9 C)  TempSrc: Oral  Weight: 122 lb (55.3 kg)  Height: 5' (1.524 m)   Body mass index is 23.83 kg/m.   Wt Readings from Last 3 Encounters:  12/02/17 122 lb (55.3 kg)  11/30/17 122 lb (55.3 kg)  11/26/17 123 lb 14.4 oz (56.2 kg)    Physical Exam  Constitutional: She is oriented to person, place, and time. She appears well-developed and well-nourished. No distress.  HENT:  Head: Normocephalic and atraumatic.  Mouth/Throat: Oropharynx is clear and moist.  Eyes: Conjunctivae and EOM are normal. Pupils are equal, round, and reactive to light. Right eye exhibits no discharge. Left eye exhibits no discharge.  Neck: Neck supple.  Cardiovascular: Normal rate and regular rhythm.  Murmur heard. Pulmonary/Chest: Effort normal and breath sounds normal. She has no wheezes. She has no rales.  Abdominal: Soft. Bowel sounds are normal. She exhibits no mass. There is no tenderness.  Musculoskeletal: She exhibits no edema.  Lymphadenopathy:    She has no cervical adenopathy.  Neurological: She is alert and oriented to person, place, and time.  Skin: Skin is warm and dry. No rash noted. She is not diaphoretic.  Psychiatric: She has a normal mood and affect. Her behavior is normal.    Labs reviewed: Basic Metabolic Panel: Recent Labs    11/12/17 0821 11/19/17 0840 11/26/17 1314  NA 139 139 138  K 3.9 4.5 4.2  CL 103 102 103  CO2 27 28 27   GLUCOSE 134 161* 109  BUN 18 18 20   CREATININE 0.87 0.89 0.83  CALCIUM 9.0 9.4 10.2   Liver Function Tests: Recent Labs    11/12/17 0821 11/19/17 0840 11/26/17 1314  AST 23 36* 33  ALT 15 68* 37  ALKPHOS 80 105 98  BILITOT 0.8 0.7 0.7  PROT 6.3* 6.7 6.4  ALBUMIN 3.3* 3.3* 3.4*   No results for input(s): LIPASE, AMYLASE in the last 8760 hours. No results for input(s): AMMONIA in the last 8760 hours. CBC: Recent Labs    11/12/17 0821 11/19/17 0840 11/26/17 1314  WBC 7.3 7.4 9.4  NEUTROABS 5.7 5.9 7.9*  HGB 13.0 13.7 12.8  HCT 39.6 41.6 38.9  MCV 88.9 89.6 88.8  PLT 256 298 268   Cardiac Enzymes: No results for input(s): CKTOTAL, CKMB, CKMBINDEX, TROPONINI in the last 8760 hours. BNP: Invalid input(s): POCBNP Lab Results  Component Value Date   HGBA1C 5.4  02/23/2017   Lab Results  Component Value Date   TSH 1.07 08/31/2017   Lab Results  Component Value Date   [id number] 450 02/22/2017   No results found for: FOLATE No results found for: IRON, TIBC, FERRITIN  Lipid Panel: No results for input(s): CHOL, HDL, LDLCALC, TRIG, CHOLHDL, LDLDIRECT in the last 8760 hours. Lab Results  Component Value Date   HGBA1C 5.4 02/23/2017    Procedures since last visit: No results found.  Assessment/Plan  SVT/PSVT/PAT S/p pacemaker. Denies palpitations. Controlled HR. Continue metoprolol succinate 50 mg daily. Bmp reviewed  Temporal arteritis Denies any headache. Continue prednisone 5 mg daily  Carcinoma of breast With metastases to bone. Followed by Dr Doris Cheadle. C/w trastuzumab and capecitabine current regimen. Reviewed oncology notes from 11/26/17  Senile osteoporosis Continue calcium and vitamin d supplement.   Goiter No dysphagia or new trouble with breathing. TSH in 11/18 stable. monitor  Labs/tests ordered:  None  Communication: reviewed care plan with patient and charge nurse.    Blanchie Serve, MD Internal Medicine [facility] [address] [place], [place] [address] Cell Phone (Monday-Friday 8 am - 5 pm): [phone] On Call: [phone] and follow prompts after 5 pm and on weekends Office Phone: [phone] Office Fax: [phone]

## 2017-12-06 ENCOUNTER — Other Ambulatory Visit: Payer: Self-pay | Admitting: Oncology

## 2017-12-06 DIAGNOSIS — C50919 Malignant neoplasm of unspecified site of unspecified female breast: Secondary | ICD-10-CM

## 2017-12-06 DIAGNOSIS — C7951 Secondary malignant neoplasm of bone: Principal | ICD-10-CM

## 2017-12-09 NOTE — Progress Notes (Signed)
Lauren Buchanan  [phone] [phone]   ID: Lauren Buchanan DOB: 1925-02-26  MR#: [id number]  YWV#:[id number]  Patient Care Team: Lauren Serve, MD as PCP - General (Internal Medicine) Lauren Skates, MD as Consulting Physician (General Surgery) Lauren Buchanan, [name], MD as Consulting Physician (Oncology) [name], MD as Consulting Physician (Radiation Oncology) Rockwell Germany, RN as Registered Nurse Lauren Kaufmann, RN as Registered Nurse Lauren Fitz, MD as Consulting Physician (Family Medicine) [name], [name], NP as Nurse Practitioner (Internal Medicine) Lauren Grayer, MD as Consulting Physician (Cardiology) PCP: Lauren Serve, MD GYN: OTHER MD: Lauren Sewer MD, Lauren Grayer MD, Lauren Bailey MD, Lauren Bookbinder MD  CHIEF COMPLAINT: bilateral estrogen receptor positive breast cancer, right-sided HER-2 positive breast cancer,  bone metastases  CURRENT TREATMENT: zolendronate, capecitabine, trastuzumab  NOTE: Patient requests her daughter Lauren Buchanan be contacted regarding all appointments and procedures. [name] can be reached at [phone]  INTERVAL HISTORY: Lauren Buchanan returns today for follow-up and treatment of her bilateral estrogen receptor positive breast cancers accompanied by her daughter Lauren Buchanan. Lauren Buchanan receives trastuzumab every 14 days, with a dose due today. She tolerates this well.  She also receives zolendronate every 28 days, with her most recent dose September 15, 2017.  She has a dose due today.  She also tolerates this well.  She is also on capecitabine, currently at 2 tablets daily, 7 days on, 7 days off. She notes that this is her off week. She tolerates this poorly due to continuing to have frequent, watery diarrhea. She notes that this mostly occurs in the mornings.    REVIEW OF SYSTEMS: Lauren Buchanan reports that she has been okay. Although she has been having frequent diarrhea, she has been able to maintain her weight. She denies unusual headaches,  visual changes, nausea, vomiting, or dizziness. There has been no unusual cough, phlegm production, or pleurisy. This been no change in bowel or bladder habits. She denies unexplained fatigue or unexplained weight loss, bleeding, rash, or fever. A detailed review of systems was otherwise stable.    BREAST CANCER HISTORY: From the original intake note:  "Lauren Buchanan"  Has been aware of a sore right nipple for the last 6-8 months. She attributed it to her washing machine, which rubs that area when she uses it. On  11/21/2014 she saw Dr. 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 She mentioned the chest wall problem and Dr. Ubaldo Glassing obtained to skin biopsies 1 from the right inferior central chest and the other one of from the right mid medial chest.  The second of these showed a nodular basal cell carcinoma. The first one however showed invasive carcinoma consistent with a breast primary. This tumor was estrogen receptor positive at 100%, progesterone receptor positive at 36%, both with strong staining intensity, and HER-2 was amplified , the signals ratio being 6.2. [name] [id number])  On 12/06/2014 the patient underwent bilateral diagnostic mammography with bilateral ultrasonography. Breast density was category B. There was bilateral nipple retraction. In the left breast upper outer quadrant there was a spiculated mass noted by mammography with a second spiculated mass in the lower inner quadrant. By ultrasound there was a 1.6 cm hypoechoic lesion at the 1:30 o'clock position in the left breast, and a second mass more medially measuring 0.9 cm. Also at the 8:00 position in the left breast 2 cm from the nipple there was a 1.3 cm mass. Ultrasound of the left axilla showed an enlarged lymph node  with fatty hilum replacement, measuring 1.4 cm.  In the right breast mammography showed a cluster of indeterminate microcalcifications in the upper outer quadrant and a retroareolar mass, which bile does  sound measured 2.9 cm. Ultrasound of the right axilla showed a right axilla lymph node measuring 1.4 cm.  On 12/06/2014 the patient underwent needle core biopsy of 2 separate masses in the left breast (at 1:00 and 8:00) as well as the enlarged axillary lymph node. All 3 were positive for an invasive lobular carcinoma (E-cadherin negative). All 3 tumors were estrogen receptor positive with strong staining intensity (between 87% and 100%). The lymph node was progesterone receptor negative, the other 2 masses being progesterone receptor positive at 9% and at 76%. This cancer was reported to be HER-2 positive at conference today, though I do not have the ratios at hand.  The patient is not an MRI candidate because she has a permanent pacemaker in place. Her subsequent history is as detailed below    PAST MEDICAL HISTORY: Past Medical History:  Diagnosis Date  . Asthma   . Breast cancer ([place]) 12/08/2014   Bilateral  . Cancer of central portion of female breast ([place]) 12/04/2014  . Cancer of central portion of female breast ([place]) 12/04/2014  . Complete heart block ([place]) 1999   s/p PPM by Dr Olevia Perches, most recent generator change 2009  . COPD (chronic obstructive pulmonary disease) ([place])   . Diverticulosis   . DJD (degenerative joint disease)   . Esophageal stricture   . GERD (gastroesophageal reflux disease)   . Giant cell arteritis ([place])   . Goiter   . Hemorrhoids   . Hiatal hernia   . IBS (irritable bowel syndrome)   . Lung cancer ([place])   . Paroxysmal atrial fibrillation ([place]) 12/17/2015   asymptomatic, <1%, determined on regular pacemaker interrogation  . Pneumonia   . Renal cyst   . S/P radiation therapy 04/24/15 completed   T-11  . Wears glasses     PAST SURGICAL HISTORY: Past Surgical History:  Procedure Laterality Date  . ANAL FISSURE REPAIR    . ARTERY BIOPSY Left 01/03/2013   Procedure: BIOPSY LEFT TEMPORAL ARTERY;  Surgeon: Ascencion Dike, MD;  Location: [facility];   Service: ENT;  Laterality: Left;  . BREAST BIOPSY Right   . CHOLECYSTECTOMY    . COLONOSCOPY    . FRACTURE SURGERY     fx lt wrist  . hemorrhoidectomy    . left lower lobectomy for brochiectasis  1950  . MOUTH SURGERY    . PACEMAKER INSERTION  1999   Gen change ([facility]) by Dr Olevia Perches 2009  . RECTAL POLYPECTOMY    . RENAL CYST EXCISION    . TONSILLECTOMY      FAMILY HISTORY Family History  Problem Relation Age of Onset  . Heart disease Mother   . Breast cancer Mother   . Stomach cancer Father   . Alcohol abuse Father   . Seizures Son   . Thyroid disease Sister    the patient's father died at the age of 35, with stomach cancer. The patient's mother died at the age of 9 from a myocardial infarction. She had been diagnosed with breast cancer in her 53s. The patient had no brothers, 2 sisters. There is no other history of breast or ovarian cancer in the family to the patient's knowledge  GYNECOLOGIC HISTORY:  No LMP recorded. Patient is postmenopausal. Menarche age 67, first live birth age 64. The patient  is GX P4. She underwent menopause in her late 6s. She did not take hormone replacement.  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 She is a widow, lives by herself, with no pets. Daughter Wendy Poet lives in [place] where she is Mudlogger of a preschool and day care. Son Zaydah Nawabi lives in [place] where he owns a furniture to sign store. Daughter Shelly Rubenstein lives in [place]. She owns a Chiropractor. Son Randall Hiss is physically and mentally disabled and lives in a group home in [place]. The patient has 7 grandchildren and 2 great-grandchildren. She attends a local [place]: In place; the patient does not wish to be resuscitated in case of a terminal event; the patient's daughter Lauren Buchanan is her healthcare power of attorney. She can be reached at [phone]. The patient has a DO NOT RESUSCITATE order in place at [facility].  HEALTH MAINTENANCE: Social History   Tobacco Use  . Smoking status: Never Smoker  . Smokeless tobacco: Never Used  Substance Use Topics  . Alcohol use: Yes    Alcohol/week: 0.0 oz    Comment: Occasional   . Drug use: No     Colonoscopy:  PAP:  Bone density:02/19/2015, osteoporosis  Lipid panel:  Allergies  Allergen Reactions  . Morphine And Related Nausea Only  . Amoxicillin-Pot Clavulanate Other (See Comments)    Unknown     Current Outpatient Medications  Medication Sig Dispense Refill  . Calcium Citrate-Vitamin D (CITRACAL PETITES/VITAMIN D) 200-250 MG-UNIT TABS Take 1 tablet by mouth daily.      . capecitabine (XELODA) 500 MG tablet TAKE 3 TABLETS (1,500 MG TOTAL) BY MOUTH 2 (TWO) TIMES DAILY AFTER A MEAL. TAKE FOR 7 DAYS ON, 7 DAYS OFF, REPEAT. 84 tablet 6  . diclofenac sodium (VOLTAREN) 1 % GEL Apply topically 3 (three) times daily as needed.    . lactose free nutrition (BOOST) LIQD Take 237 mLs daily by mouth.    . metoprolol succinate (TOPROL-XL) 50 MG 24 hr tablet Take 50 mg by mouth daily. Take with or immediately following a meal.    . predniSONE (DELTASONE) 5 MG tablet Take 5 mg by mouth daily with breakfast. Reported on 11/19/2015    . traMADol (ULTRAM) 50 MG tablet Take 50 mg by mouth every 6 (six) hours as needed.     . TURMERIC PO Take 500 mg by mouth daily.      No current facility-administered medications for this visit.     OBJECTIVE: Elderly white woman in no acute distress  Vitals:   12/10/17 0941  BP: (!) 155/67  Pulse: 96  Resp: 18  Temp: 99.2 F (37.3 C)  SpO2: 97%     Body mass index is 24.02 kg/m.    ECOG FS:1 - Symptomatic but completely ambulatory  Sclerae unicteric, EOMs intact Lungs no rales or rhonchi Heart regular rate and rhythm Abd soft, nontender, positive bowel sounds MSK no focal spinal tenderness, no upper extremity lymphedema Neuro: nonfocal, well oriented, appropriate affect Breasts: The subareolar mass in the  right breast seems to be smaller and softer.  On the left side however the ulcerated lesion laterally has a necrotic center and has been bleeding recently.  This is imaged below    Left Breast 12/10/2017     LAB RESULTS:  CMP     Component Value Date/Time   NA 138 12/10/2017 0912   NA 136 09/29/2017  1435   K 3.9 12/10/2017 0912   K 4.1 09/29/2017 1435   CL 102 12/10/2017 0912   CO2 25 12/10/2017 0912   CO2 27 09/29/2017 1435   GLUCOSE 130 12/10/2017 0912   GLUCOSE 148 (H) 09/29/2017 1435   BUN 17 12/10/2017 0912   BUN 14.9 09/29/2017 1435   CREATININE 0.87 12/10/2017 0912   CREATININE 1.0 09/29/2017 1435   CALCIUM 9.9 12/10/2017 0912   CALCIUM 9.3 09/29/2017 1435   PROT 6.9 12/10/2017 0912   PROT 7.0 09/29/2017 1435   ALBUMIN 3.5 12/10/2017 0912   ALBUMIN 3.6 09/29/2017 1435   AST 32 12/10/2017 0912   AST 28 09/29/2017 1435   ALT 37 12/10/2017 0912   ALT 22 09/29/2017 1435   ALKPHOS 102 12/10/2017 0912   ALKPHOS 89 09/29/2017 1435   BILITOT 1.0 12/10/2017 0912   BILITOT 1.12 09/29/2017 1435   GFRNONAA 57 (L) 12/10/2017 0912   GFRAA >60 12/10/2017 0912    INo results found for: SPEP, UPEP  Lab Results  Component Value Date   WBC 7.6 12/10/2017   NEUTROABS 5.6 12/10/2017   HGB 14.0 12/10/2017   HCT 43.2 12/10/2017   MCV 90.1 12/10/2017   PLT 259 12/10/2017      Chemistry      Component Value Date/Time   NA 138 12/10/2017 0912   NA 136 09/29/2017 1435   K 3.9 12/10/2017 0912   K 4.1 09/29/2017 1435   CL 102 12/10/2017 0912   CO2 25 12/10/2017 0912   CO2 27 09/29/2017 1435   BUN 17 12/10/2017 0912   BUN 14.9 09/29/2017 1435   CREATININE 0.87 12/10/2017 0912   CREATININE 1.0 09/29/2017 1435   GLU 94 02/23/2017      Component Value Date/Time   CALCIUM 9.9 12/10/2017 0912   CALCIUM 9.3 09/29/2017 1435   ALKPHOS 102 12/10/2017 0912   ALKPHOS 89 09/29/2017 1435   AST 32 12/10/2017 0912   AST 28 09/29/2017 1435   ALT 37 12/10/2017 0912   ALT 22  09/29/2017 1435   BILITOT 1.0 12/10/2017 0912   BILITOT 1.12 09/29/2017 1435       Lab Results  Component Value Date   LABCA2 20 04/01/2016    No components found for: [id number]  No results for input(s): INR in the last 168 hours.  Urinalysis    Component Value Date/Time   COLORURINE COLORLESS (A) 11/29/2016 1707   APPEARANCEUR CLEAR 11/29/2016 1707   LABSPEC 1.002 (L) 11/29/2016 1707   PHURINE 7.0 11/29/2016 1707   GLUCOSEU NEGATIVE 11/29/2016 1707   HGBUR SMALL (A) 11/29/2016 1707   HGBUR trace-intact 01/17/2010 0932   BILIRUBINUR NEGATIVE 11/29/2016 1707   BILIRUBINUR n 09/01/2011 1157   KETONESUR NEGATIVE 11/29/2016 1707   PROTEINUR NEGATIVE 11/29/2016 1707   UROBILINOGEN 0.2 09/27/2014 1800   NITRITE NEGATIVE 11/29/2016 1707   LEUKOCYTESUR NEGATIVE 11/29/2016 1707    STUDIES: Since her last visit, she completed a PET scan on 10/15/2017 showing: Enlarging, increasingly hypermetabolic bilateral breast masses. Right axillary and subpectoral lymph nodes have not significantly increased in size, but are also slightly more hypermetabolic. No distant metastases. Stable sub solid left lung lesion with mild hypermetabolic activity.  ASSESSMENT: 84 y.o. [place] woman with bilateral breast cancer, as follows:  (1) status post right breastt overlapping sites skin biopsy 11/21/2014 for a clinical T4 N1, stage IIIB invasive ductal carcinoma, estrogen and progesterone receptor positive, HER-2 amplified  (2) status post 2 separate left breast upper outer quadrant  biopsies and one left axillary lymph node biopsy 12/04/2014, all positive for a clinical mT1c N1, stage IIA invasive lobular carcinoma, estrogen receptor positive, progesterone receptor variable, with MIB-1 between 10 and 40%, and no HER-2 amplification  (3) neoadjuvant anastrozole started 12/14/2014. Discontinued with progression by repeat left breast US May 2017  (4) definitive surgery to consist of bilateral  mastectomies without formal axillary dissection: Postponed  METASTATIC DISEASE: March 2016 (note: R breast CA is HER-2 positive, L is negative) (5) PET scan 12/19/2014 shows in addition to the bilateral breast and bilateral axillary masses, a lesion at the T11 vertebral body, showing an irregular lucency on prior CT exam  (a) adjuvant radiationt to T10-T12: 04/11/2015 through 04/24/2015 to a total dose of 30 gray in 10 fractions  (6) initially postponed anti-HER-2 treatment depending on response to antiestrogen therapy alone.    (7) osteoporosis with T score of -2.6 on bone scan obtained 02/19/2015.  (a) zolendronate started 03/20/2015, repeated every 12 weeks   (8) fulvestrant started 03/04/16--discontinued January 2019 with progression  (a) patient refused adding palbociclib  (b) PET scan in 10/15/2016 showed mild increase in the right breast mass and increased size and activity of 3.2 cm of solid left lung nodule  (c) letrozole added 10/16/2016--discontinued January 2019  (d) PET scan obtained 02/10/2017 showing a significant decrease in SUV in the measurable disease  (e) PET scan 10/15/2017 showing increase in bilateral breast and right axillary adenopathy; left lung lesion is stable  (9) Doppler ultrasound both lower extremities 11/17/2016 and repeat 11/29/2016 are  Negative  (10) started trastuzumab 11/12/2017, repeated every 2 weeks  (11) started capecitabine 11/15/2017, to be received 1 week on 1 week off  (a) initial dose will be 1000 mg twice daily  (b) dose reduced on 11/29/2017 to 1000 mg daily due to questionable tolerance issues  (c) capecitabine discontinued 12/10/2017 with intolerance (diarrhea)  (12) exemestane ordered 12/10/2017.  To add everolimus 12/24/2017 at 2.5 mg daily  PLAN: Lauren Buchanan was not able to tolerate the capecitabine.  Accordingly we are stopping that.  As always when we change the main medication the question is do we want to go back to antiestrogens or do  we want to try different chemo agent  The patient and her daughter would like to give antiestrogens one last try.  We are going to start exemestane with everolimus.  The everolimus will be a 2.5 mg daily.  I have cautioned them regarding mucositis and pneumonitis symptoms and if that or any other symptom develops of course they are going to let us know.  The exemestane may be started as soon as they can [facility]  The everolimus we will have to facilitate through our oral chemotherapy pharmacist specialists.  In addition I think [place] would benefit from a port.  We discussed that at length today.  She is agreeable.  I have put in the order  We also discussed bilateral mastectomies.  This might actually be the simplest thing for her to do but she is very reluctant to proceed.  We are going to obtain new baseline bilateral breast ultrasonography to document where we are now so we can tell if we are making headway when we start her new treatments  She will receive zolendronate today together with her trastuzumab.    Tejal Monroy, [name], MD  12/10/17 10:01 AM [facility] [address] [place], [place] [address] Tel. [phone]  Fax. [phone]  This document serves as a record of services personally performed by Lurline Del, MD. It was created on his behalf by Sheron Nightingale, a trained medical scribe. The creation of this record is based on the scribe's personal observations and the provider's statements to them.   I have reviewed the above documentation for accuracy and completeness, and I agree with the above.

## 2017-12-10 ENCOUNTER — Inpatient Hospital Stay: Payer: Medicare Other

## 2017-12-10 ENCOUNTER — Telehealth: Payer: Self-pay | Admitting: Pharmacist

## 2017-12-10 ENCOUNTER — Inpatient Hospital Stay (HOSPITAL_BASED_OUTPATIENT_CLINIC_OR_DEPARTMENT_OTHER): Payer: Medicare Other | Admitting: Oncology

## 2017-12-10 ENCOUNTER — Telehealth: Payer: Self-pay | Admitting: Oncology

## 2017-12-10 ENCOUNTER — Other Ambulatory Visit: Payer: Self-pay | Admitting: *Deleted

## 2017-12-10 VITALS — BP 155/67 | HR 96 | Temp 99.2°F | Resp 18 | Ht 60.0 in | Wt 123.0 lb

## 2017-12-10 DIAGNOSIS — Z950 Presence of cardiac pacemaker: Secondary | ICD-10-CM

## 2017-12-10 DIAGNOSIS — J449 Chronic obstructive pulmonary disease, unspecified: Secondary | ICD-10-CM | POA: Diagnosis not present

## 2017-12-10 DIAGNOSIS — Z923 Personal history of irradiation: Secondary | ICD-10-CM

## 2017-12-10 DIAGNOSIS — K219 Gastro-esophageal reflux disease without esophagitis: Secondary | ICD-10-CM | POA: Diagnosis not present

## 2017-12-10 DIAGNOSIS — C50811 Malignant neoplasm of overlapping sites of right female breast: Secondary | ICD-10-CM

## 2017-12-10 DIAGNOSIS — Z7189 Other specified counseling: Secondary | ICD-10-CM | POA: Insufficient documentation

## 2017-12-10 DIAGNOSIS — Z1501 Genetic susceptibility to malignant neoplasm of breast: Secondary | ICD-10-CM

## 2017-12-10 DIAGNOSIS — R918 Other nonspecific abnormal finding of lung field: Secondary | ICD-10-CM | POA: Diagnosis not present

## 2017-12-10 DIAGNOSIS — C7951 Secondary malignant neoplasm of bone: Secondary | ICD-10-CM | POA: Diagnosis not present

## 2017-12-10 DIAGNOSIS — M810 Age-related osteoporosis without current pathological fracture: Secondary | ICD-10-CM | POA: Diagnosis not present

## 2017-12-10 DIAGNOSIS — Z170 Estrogen receptor positive status [ER+]: Principal | ICD-10-CM

## 2017-12-10 DIAGNOSIS — C50412 Malignant neoplasm of upper-outer quadrant of left female breast: Secondary | ICD-10-CM

## 2017-12-10 DIAGNOSIS — C773 Secondary and unspecified malignant neoplasm of axilla and upper limb lymph nodes: Secondary | ICD-10-CM

## 2017-12-10 DIAGNOSIS — M1990 Unspecified osteoarthritis, unspecified site: Secondary | ICD-10-CM | POA: Diagnosis not present

## 2017-12-10 DIAGNOSIS — C3492 Malignant neoplasm of unspecified part of left bronchus or lung: Secondary | ICD-10-CM

## 2017-12-10 DIAGNOSIS — C50912 Malignant neoplasm of unspecified site of left female breast: Secondary | ICD-10-CM | POA: Diagnosis not present

## 2017-12-10 DIAGNOSIS — I480 Paroxysmal atrial fibrillation: Secondary | ICD-10-CM | POA: Diagnosis not present

## 2017-12-10 DIAGNOSIS — Z66 Do not resuscitate: Secondary | ICD-10-CM

## 2017-12-10 DIAGNOSIS — C50919 Malignant neoplasm of unspecified site of unspecified female breast: Secondary | ICD-10-CM

## 2017-12-10 DIAGNOSIS — Z5112 Encounter for antineoplastic immunotherapy: Secondary | ICD-10-CM | POA: Diagnosis not present

## 2017-12-10 DIAGNOSIS — Z79899 Other long term (current) drug therapy: Secondary | ICD-10-CM

## 2017-12-10 DIAGNOSIS — Z803 Family history of malignant neoplasm of breast: Secondary | ICD-10-CM

## 2017-12-10 DIAGNOSIS — C50911 Malignant neoplasm of unspecified site of right female breast: Secondary | ICD-10-CM | POA: Diagnosis not present

## 2017-12-10 LAB — CBC WITH DIFFERENTIAL/PLATELET
BASOS PCT: 0 %
Basophils Absolute: 0 10*3/uL (ref 0.0–0.1)
Eosinophils Absolute: 0.1 10*3/uL (ref 0.0–0.5)
Eosinophils Relative: 2 %
HCT: 43.2 % (ref 34.8–46.6)
HEMOGLOBIN: 14 g/dL (ref 11.6–15.9)
Lymphocytes Relative: 12 %
Lymphs Abs: 0.9 10*3/uL (ref 0.9–3.3)
MCH: 29.2 pg (ref 25.1–34.0)
MCHC: 32.4 g/dL (ref 31.5–36.0)
MCV: 90.1 fL (ref 79.5–101.0)
Monocytes Absolute: 0.9 10*3/uL (ref 0.1–0.9)
Monocytes Relative: 12 %
NEUTROS PCT: 74 %
Neutro Abs: 5.6 10*3/uL (ref 1.5–6.5)
Platelets: 259 10*3/uL (ref 145–400)
RBC: 4.79 MIL/uL (ref 3.70–5.45)
RDW: 15.9 % — ABNORMAL HIGH (ref 11.2–14.5)
WBC: 7.6 10*3/uL (ref 3.9–10.3)

## 2017-12-10 LAB — COMPREHENSIVE METABOLIC PANEL
ALK PHOS: 102 U/L (ref 40–150)
ALT: 37 U/L (ref 0–55)
AST: 32 U/L (ref 5–34)
Albumin: 3.5 g/dL (ref 3.5–5.0)
Anion gap: 11 (ref 3–11)
BUN: 17 mg/dL (ref 7–26)
CALCIUM: 9.9 mg/dL (ref 8.4–10.4)
CHLORIDE: 102 mmol/L (ref 98–109)
CO2: 25 mmol/L (ref 22–29)
CREATININE: 0.87 mg/dL (ref 0.60–1.10)
GFR, EST NON AFRICAN AMERICAN: 57 mL/min — AB (ref >60)
Glucose, Bld: 130 mg/dL (ref 70–140)
Potassium: 3.9 mmol/L (ref 3.5–5.1)
Sodium: 138 mmol/L (ref 136–145)
Total Bilirubin: 1 mg/dL (ref 0.2–1.2)
Total Protein: 6.9 g/dL (ref 6.4–8.3)

## 2017-12-10 MED ORDER — ACETAMINOPHEN 325 MG PO TABS
650.0000 mg | ORAL_TABLET | Freq: Once | ORAL | Status: AC
  Administered 2017-12-10: 650 mg via ORAL

## 2017-12-10 MED ORDER — ACETAMINOPHEN 325 MG PO TABS
ORAL_TABLET | ORAL | Status: AC
  Filled 2017-12-10: qty 2

## 2017-12-10 MED ORDER — SODIUM CHLORIDE 0.9 % IV SOLN
Freq: Once | INTRAVENOUS | Status: AC
  Administered 2017-12-10: 11:00:00 via INTRAVENOUS

## 2017-12-10 MED ORDER — TRASTUZUMAB CHEMO 150 MG IV SOLR
4.0000 mg/kg | Freq: Once | INTRAVENOUS | Status: AC
  Administered 2017-12-10: 231 mg via INTRAVENOUS
  Filled 2017-12-10: qty 11

## 2017-12-10 MED ORDER — DIPHENHYDRAMINE HCL 25 MG PO CAPS
ORAL_CAPSULE | ORAL | Status: AC
Start: 2017-12-10 — End: 2017-12-10
  Filled 2017-12-10: qty 1

## 2017-12-10 MED ORDER — DIPHENHYDRAMINE HCL 25 MG PO CAPS
25.0000 mg | ORAL_CAPSULE | Freq: Once | ORAL | Status: AC
  Administered 2017-12-10: 25 mg via ORAL

## 2017-12-10 MED ORDER — EXEMESTANE 25 MG PO TABS: 25.0000 mg | ORAL_TABLET | Freq: Every day | ORAL | 4 refills | Status: DC

## 2017-12-10 MED ORDER — ZOLEDRONIC ACID 4 MG/5ML IV CONC
3.3000 mg | Freq: Once | INTRAVENOUS | Status: AC
  Administered 2017-12-10: 3.3 mg via INTRAVENOUS
  Filled 2017-12-10: qty 4.13

## 2017-12-10 MED ORDER — EVEROLIMUS 2.5 MG PO TABS: 2.5000 mg | ORAL_TABLET | Freq: Every day | ORAL | 12 refills | Status: DC

## 2017-12-10 NOTE — Telephone Encounter (Signed)
Oral Oncology Pharmacist Encounter  Received new prescription for Afinitor for the treatment of metastatic, hormone receptor positive breast cancer that has progressed on letrozole in conjunction with exemestane, planned duration until disease progression or unacceptable toxicity.  Labs from 12/10/17 assessed, OK for treatment.  Current medication list in Epic reviewed, no DDIs with Afinitor or exemestane identified.  Prescription has been e-scribed to the [facility] for benefits analysis and approval. Prior authorization is required and has been submitted Key [id number] Status is pending  Oral Oncology Clinic will continue to follow for insurance authorization, copayment issues, initial counseling and start date.  Johny Drilling, PharmD, BCPS, BCOP 12/10/2017 10:51 AM Oral Oncology Clinic [phone]

## 2017-12-10 NOTE — Telephone Encounter (Signed)
Gave avs and calendar for march - may

## 2017-12-10 NOTE — Telephone Encounter (Signed)
Oral Chemotherapy Pharmacist Encounter   I spoke with patient in exam room and daughter, Lauren Buchanan, over the phone, for overview of: Afinitor.   Counseled patient on administration, dosing, side effects, monitoring, drug-food interactions, safe handling, storage, and disposal.  Patient will take Afinitor 2.5mg  tablets, 1 tablet by mouth once daily, with water, without regard to food. Patient understands to take Afinitor consistently with regards to food and at approximately the same time each day.  Patient knows to aviod grapefruit or grapefruit juice while on therapy with Afinitor.  Patient will take exemestane 25mg  tablets, 1 tablet by mouth once daily after a meal.   Afinitor and exemestane start date: TBD, week of 12/13/17  Side effects include but not limited to: mouth sores, GI upset, rash, increased blood sugars, decreased blood counts, and edema.  Dexamethasone mouthwash for the prevention of stomatiits e-scribed to pharmacy.   Reviewed with patient importance of keeping a medication schedule and plan for any missed doses.  Lauren Buchanan and [facility] voiced understanding and appreciation.   All questions answered. Medication reconciliation performed and medication/allergy list updated.  Will follow up with Lauren Buchanan next week regarding copayment and when medications will be ready for pick-up at the pharmacy.   Lauren Buchanan will deliver medications to [facility] for administration. Lauren Buchanan chose to wait until exemestane can be acquired from [facility] so that the cost can be covered by existing copayment grant as opposed to patient paying out of pocket as she would have to if the exemestane was acquired through the pharm,acy at [facility]. This has been coordinated with patient's facility.  Patient knows to call the office with questions or concerns. Oral Oncology Clinic will continue to follow.  Thank you,  Johny Drilling, PharmD, BCPS, BCOP 12/10/2017 5:00 PM Oral Oncology Clinic [phone]

## 2017-12-15 ENCOUNTER — Encounter: Payer: Self-pay | Admitting: Pharmacist

## 2017-12-15 ENCOUNTER — Telehealth: Payer: Self-pay | Admitting: Pharmacy Technician

## 2017-12-15 MED FILL — EXEMESTANE 25 MG TABLET: 25 | 90 days supply | Qty: 90 | Fill #0

## 2017-12-15 NOTE — Progress Notes (Signed)
Oral Chemotherapy Pharmacist Encounter  Dispensed samples to patient:  Medication: Afinitor (everolimus) 5 mg tablets Instructions: Take 1/2 tablet (2.5 mg) by mouth once daily.  Take with a full glass of water.  May take at breakfast with exemestane. Quantity dispensed: 14 Days supply: 28 Manufacturer: [employer] Lot: [id number] Exp: January 2020  Patient will be started on manufacture samples while being sign up for compassionate use through the manufacturer to receive Afinitor at no cost to the patient.  Oral oncology clinic will continue to follow.  Johny Drilling, PharmD, BCPS, BCOP 12/15/2017 1:17 PM Oral Oncology Clinic [phone]

## 2017-12-21 ENCOUNTER — Other Ambulatory Visit: Payer: Self-pay | Admitting: Radiology

## 2017-12-21 ENCOUNTER — Telehealth: Payer: Self-pay | Admitting: Pharmacist

## 2017-12-21 NOTE — Telephone Encounter (Signed)
Oral Oncology Pharmacist Encounter  Patient with prohibitively expensive copayment for Afinitor therapy.  Completed application for the [employer] faxed to [phone].  Patient previously supplied samples of Afinitor on 12/15/2017 in order to get patient started on therapy.  This encounter will continue to be updated until final decision on enrollment status.  Oral Oncology Clinic will continue to follow.  Johny Drilling, PharmD, BCPS, BCOP 12/21/2017 1:37 PM Oral Oncology Clinic [phone]

## 2017-12-22 ENCOUNTER — Ambulatory Visit (HOSPITAL_COMMUNITY)
Admission: RE | Admit: 2017-12-22 | Discharge: 2017-12-22 | Disposition: A | Discharge status: Home / Self Care | Payer: Medicare Other | Source: Ambulatory Visit | Attending: Oncology | Admitting: Oncology

## 2017-12-22 ENCOUNTER — Encounter (HOSPITAL_COMMUNITY): Payer: Self-pay

## 2017-12-22 ENCOUNTER — Other Ambulatory Visit: Payer: Self-pay | Admitting: Oncology

## 2017-12-22 DIAGNOSIS — C50919 Malignant neoplasm of unspecified site of unspecified female breast: Secondary | ICD-10-CM

## 2017-12-22 DIAGNOSIS — Z7952 Long term (current) use of systemic steroids: Secondary | ICD-10-CM | POA: Insufficient documentation

## 2017-12-22 DIAGNOSIS — M1990 Unspecified osteoarthritis, unspecified site: Secondary | ICD-10-CM | POA: Diagnosis not present

## 2017-12-22 DIAGNOSIS — Z880 Allergy status to penicillin: Secondary | ICD-10-CM | POA: Diagnosis not present

## 2017-12-22 DIAGNOSIS — C50912 Malignant neoplasm of unspecified site of left female breast: Secondary | ICD-10-CM | POA: Diagnosis not present

## 2017-12-22 DIAGNOSIS — C50811 Malignant neoplasm of overlapping sites of right female breast: Secondary | ICD-10-CM

## 2017-12-22 DIAGNOSIS — K219 Gastro-esophageal reflux disease without esophagitis: Secondary | ICD-10-CM | POA: Diagnosis not present

## 2017-12-22 DIAGNOSIS — J449 Chronic obstructive pulmonary disease, unspecified: Secondary | ICD-10-CM | POA: Diagnosis not present

## 2017-12-22 DIAGNOSIS — C50412 Malignant neoplasm of upper-outer quadrant of left female breast: Secondary | ICD-10-CM

## 2017-12-22 DIAGNOSIS — C3492 Malignant neoplasm of unspecified part of left bronchus or lung: Secondary | ICD-10-CM

## 2017-12-22 DIAGNOSIS — Z5111 Encounter for antineoplastic chemotherapy: Secondary | ICD-10-CM | POA: Diagnosis not present

## 2017-12-22 DIAGNOSIS — F0390 Unspecified dementia without behavioral disturbance: Secondary | ICD-10-CM | POA: Diagnosis not present

## 2017-12-22 DIAGNOSIS — I878 Other specified disorders of veins: Secondary | ICD-10-CM | POA: Diagnosis not present

## 2017-12-22 DIAGNOSIS — C7951 Secondary malignant neoplasm of bone: Secondary | ICD-10-CM

## 2017-12-22 DIAGNOSIS — Z170 Estrogen receptor positive status [ER+]: Secondary | ICD-10-CM

## 2017-12-22 DIAGNOSIS — Z7189 Other specified counseling: Secondary | ICD-10-CM

## 2017-12-22 DIAGNOSIS — K589 Irritable bowel syndrome without diarrhea: Secondary | ICD-10-CM | POA: Insufficient documentation

## 2017-12-22 DIAGNOSIS — Z885 Allergy status to narcotic agent status: Secondary | ICD-10-CM | POA: Insufficient documentation

## 2017-12-22 DIAGNOSIS — Z950 Presence of cardiac pacemaker: Secondary | ICD-10-CM | POA: Insufficient documentation

## 2017-12-22 DIAGNOSIS — C50911 Malignant neoplasm of unspecified site of right female breast: Secondary | ICD-10-CM | POA: Insufficient documentation

## 2017-12-22 HISTORY — PX: IR FLUORO GUIDE PORT INSERTION RIGHT: IMG5741

## 2017-12-22 HISTORY — PX: IR US GUIDE VASC ACCESS RIGHT: IMG2390

## 2017-12-22 LAB — CBC WITH DIFFERENTIAL/PLATELET
BASOS PCT: 0 %
Basophils Absolute: 0 10*3/uL (ref 0.0–0.1)
EOS ABS: 0.2 10*3/uL (ref 0.0–0.7)
Eosinophils Relative: 2 %
HCT: 41.7 % (ref 36.0–46.0)
HEMOGLOBIN: 13.3 g/dL (ref 12.0–15.0)
Lymphocytes Relative: 12 %
Lymphs Abs: 1 10*3/uL (ref 0.7–4.0)
MCH: 29.9 pg (ref 26.0–34.0)
MCHC: 31.9 g/dL (ref 30.0–36.0)
MCV: 93.7 fL (ref 78.0–100.0)
Monocytes Absolute: 1.2 10*3/uL — ABNORMAL HIGH (ref 0.1–1.0)
Monocytes Relative: 13 %
NEUTROS PCT: 73 %
Neutro Abs: 6.4 10*3/uL (ref 1.7–7.7)
Platelets: 272 10*3/uL (ref 150–400)
RBC: 4.45 MIL/uL (ref 3.87–5.11)
RDW: 15.4 % (ref 11.5–15.5)
WBC: 8.8 10*3/uL (ref 4.0–10.5)

## 2017-12-22 LAB — PROTIME-INR
INR: 0.97
Prothrombin Time: 12.8 seconds (ref 11.4–15.2)

## 2017-12-22 MED ORDER — MIDAZOLAM HCL 2 MG/2ML IJ SOLN
INTRAMUSCULAR | Status: AC | PRN
  Administered 2017-12-22 (×2): 0.5 mg via INTRAVENOUS

## 2017-12-22 MED ORDER — LIDOCAINE-EPINEPHRINE (PF) 2 %-1:200000 IJ SOLN
INTRAMUSCULAR | Status: AC
  Filled 2017-12-22: qty 20

## 2017-12-22 MED ORDER — LIDOCAINE HCL (PF) 1 % IJ SOLN
INTRAMUSCULAR | Status: AC
  Filled 2017-12-22: qty 30

## 2017-12-22 MED ORDER — FENTANYL CITRATE (PF) 100 MCG/2ML IJ SOLN
INTRAMUSCULAR | Status: AC
  Filled 2017-12-22: qty 4

## 2017-12-22 MED ORDER — LIDOCAINE HCL (PF) 1 % IJ SOLN
INTRAMUSCULAR | Status: AC | PRN
  Administered 2017-12-22: 5 mL

## 2017-12-22 MED ORDER — SODIUM CHLORIDE 0.9 % IV SOLN
INTRAVENOUS | Status: DC
  Administered 2017-12-22: 10:00:00 via INTRAVENOUS

## 2017-12-22 MED ORDER — CLINDAMYCIN PHOSPHATE 900 MG/50ML IV SOLN
INTRAVENOUS | Status: AC
  Filled 2017-12-22: qty 50

## 2017-12-22 MED ORDER — FENTANYL CITRATE (PF) 100 MCG/2ML IJ SOLN
INTRAMUSCULAR | Status: AC | PRN
  Administered 2017-12-22 (×2): 25 ug via INTRAVENOUS

## 2017-12-22 MED ORDER — HEPARIN SOD (PORK) LOCK FLUSH 100 UNIT/ML IV SOLN
INTRAVENOUS | Status: AC
  Filled 2017-12-22: qty 5

## 2017-12-22 MED ORDER — CLINDAMYCIN PHOSPHATE 600 MG/50ML IV SOLN
600.0000 mg | Freq: Once | INTRAVENOUS | Status: AC
  Administered 2017-12-22: 600 mg via INTRAVENOUS
  Filled 2017-12-22: qty 50

## 2017-12-22 MED ORDER — MIDAZOLAM HCL 2 MG/2ML IJ SOLN
INTRAMUSCULAR | Status: AC
  Filled 2017-12-22: qty 4

## 2017-12-22 MED ORDER — HEPARIN SOD (PORK) LOCK FLUSH 100 UNIT/ML IV SOLN
INTRAVENOUS | Status: AC | PRN
  Administered 2017-12-22: 500 [IU] via INTRAVENOUS

## 2017-12-22 NOTE — H&P (Signed)
Referring Physician(s): Chauncey Cruel  Supervising Physician: Arne Cleveland  Patient Status:  [facility]  Chief Complaint: "I'm getting a port"   Subjective: Patient familiar to IR service from prior T11 lesion biopsy in 2016.  She has a history of bilateral breast carcinoma with prior treatment and now with persistent disease.  She has poor venous access and presents today for Port-A-Cath placement for additional treatment.  She currently denies fever, headache, chest pain, dyspnea, cough, abdominal/back pain, nausea, vomiting or bleeding.  She does have mild dementia.  Additional history as below. Past Medical History:  Diagnosis Date  . Asthma   . Breast cancer ([place]) 12/08/2014   Bilateral  . Cancer of central portion of female breast ([place]) 12/04/2014  . Cancer of central portion of female breast ([place]) 12/04/2014  . Complete heart block ([place]) 1999   s/p PPM by Dr Olevia Perches, most recent generator change 2009  . COPD (chronic obstructive pulmonary disease) ([place])   . Diverticulosis   . DJD (degenerative joint disease)   . Esophageal stricture   . GERD (gastroesophageal reflux disease)   . Giant cell arteritis ([place])   . Goiter   . Hemorrhoids   . Hiatal hernia   . IBS (irritable bowel syndrome)   . Lung cancer ([place])   . Paroxysmal atrial fibrillation ([place]) 12/17/2015   asymptomatic, <1%, determined on regular pacemaker interrogation  . Pneumonia   . Renal cyst   . S/P radiation therapy 04/24/15 completed   T-11  . Wears glasses    Past Surgical History:  Procedure Laterality Date  . ANAL FISSURE REPAIR    . ARTERY BIOPSY Left 01/03/2013   Procedure: BIOPSY LEFT TEMPORAL ARTERY;  Surgeon: Ascencion Dike, MD;  Location: [place];  Service: ENT;  Laterality: Left;  . BREAST BIOPSY Right   . CHOLECYSTECTOMY    . COLONOSCOPY    . FRACTURE SURGERY     fx lt wrist  . hemorrhoidectomy    . left lower lobectomy for brochiectasis  1950  . MOUTH SURGERY    .  PACEMAKER INSERTION  1999   Gen change ([facility]) by Dr Olevia Perches 2009  . RECTAL POLYPECTOMY    . RENAL CYST EXCISION    . TONSILLECTOMY        Allergies: Morphine and related and Amoxicillin-pot clavulanate  Medications: Prior to Admission medications   Medication Sig Start Date End Date Taking? Authorizing Provider  Calcium Citrate-Vitamin D (CITRACAL PETITES/VITAMIN D) 200-250 MG-UNIT TABS Take 1 tablet by mouth daily.      [provider]  diclofenac sodium (VOLTAREN) 1 % GEL Apply topically 3 (three) times daily as needed.    [provider]  everolimus (AFINITOR) 2.5 MG tablet Take 1 tablet (2.5 mg total) by mouth daily. Caution:chemotherapy. 12/10/17   [name], [name], MD  exemestane (AROMASIN) 25 MG tablet Take 1 tablet (25 mg total) by mouth daily after breakfast. 12/10/17   [name], [name], MD  lactose free nutrition (BOOST) LIQD Take 237 mLs daily by mouth.    [provider]  metoprolol succinate (TOPROL-XL) 50 MG 24 hr tablet Take 50 mg by mouth daily. Take with or immediately following a meal.    [provider]  predniSONE (DELTASONE) 5 MG tablet Take 5 mg by mouth daily with breakfast. Reported on 11/19/2015    [provider]  traMADol (ULTRAM) 50 MG tablet Take 50 mg by mouth every 6 (six) hours as needed.  [provider]  TURMERIC PO Take 500 mg by mouth daily.     [provider]  loratadine (CLARITIN) 10 MG tablet Take 10 mg by mouth daily.    01/01/12  [provider]     Vital Signs: BP (!) 160/70   Pulse 72   Temp (!) 97.5 F (36.4 C) (Oral)   Resp 16   SpO2 98%   Physical Exam awake, alert.  Chest clear to auscultation bilaterally.  Left chest wall pacer.  Heart with regular rate and rhythm.  Abdomen soft, positive bowel sounds, nontender.  No lower extremity edema.  Imaging: No results found.  Labs:  CBC: Recent Labs    11/12/17 0821 11/19/17 0840 11/26/17 1314  12/10/17 0912  WBC 7.3 7.4 9.4 7.6  HGB 13.0 13.7 12.8 14.0  HCT 39.6 41.6 38.9 43.2  PLT 256 298 268 259    COAGS: No results for input(s): INR, APTT in the last 8760 hours.  BMP: Recent Labs    11/12/17 0821 11/19/17 0840 11/26/17 1314 12/10/17 0912  NA 139 139 138 138  K 3.9 4.5 4.2 3.9  CL 103 102 103 102  CO2 27 28 27 25   GLUCOSE 134 161* 109 130  BUN 18 18 20 17   CALCIUM 9.0 9.4 10.2 9.9  CREATININE 0.87 0.89 0.83 0.87  GFRNONAA 57* 55* >60 57*  GFRAA >60 >60 >60 >60    LIVER FUNCTION TESTS: Recent Labs    11/12/17 0821 11/19/17 0840 11/26/17 1314 12/10/17 0912  BILITOT 0.8 0.7 0.7 1.0  AST 23 36* 33 32  ALT 15 68* 37 37  ALKPHOS 80 105 98 102  PROT 6.3* 6.7 6.4 6.9  ALBUMIN 3.3* 3.3* 3.4* 3.5    Assessment and Plan: Pt with  history of bilateral breast carcinoma 2016 with prior treatment and now with persistent disease.  She has poor venous access and presents today for Port-A-Cath placement for additional treatment. Risks and benefits of image guided port-a-catheter placement was discussed with the patient/daughter including, but not limited to bleeding, infection, pneumothorax, or fibrin sheath development and need for additional procedures.  All of the patient's questions were answered, patient is agreeable to proceed. Consent signed and in chart.    Electronically Signed: D. Rowe Robert, PA-C 12/22/2017, 9:53 AM   I spent a total of 20 minutes at the the patient's bedside AND on the patient's hospital floor or unit, greater than 50% of which was counseling/coordinating care for Port-A-Cath placement

## 2017-12-22 NOTE — Procedures (Signed)
  Procedure: R IJ PowerPort placed   EBL:   minimal Complications:  none immediate  See full dictation in [employer].  Dillard Cannon MD Main # [phone] Pager  [phone]

## 2017-12-22 NOTE — Discharge Instructions (Signed)
Implanted Port Insertion, Care After This sheet gives you information about how to care for yourself after your procedure. Your health care provider may also give you more specific instructions. If you have problems or questions, contact your health care provider. What can I expect after the procedure? After your procedure, it is common to have: Discomfort at the port insertion site. Bruising on the skin over the port. This should improve over 3-4 days.  Follow these instructions at home: [facility] care After your port is placed, you will get a manufacturer's information card. The card has information about your port. Keep this card with you at all times. Take care of the port as told by your health care provider. Ask your health care provider if you or a family member can get training for taking care of the port at home. A home health care nurse may also take care of the port. Make sure to remember what type of port you have. Incision care Follow instructions from your health care provider about how to take care of your port insertion site. Make sure you: Wash your hands with soap and water before you change your bandage (dressing). If soap and water are not available, use hand sanitizer. Change your dressing as told by your health care provider. Leave stitches (sutures), skin glue, or adhesive strips in place. These skin closures may need to stay in place for 2 weeks or longer. If adhesive strip edges start to loosen and curl up, you may trim the loose edges. Do not remove adhesive strips completely unless your health care provider tells you to do that. Check your port insertion site every day for signs of infection. Check for: More redness, swelling, or pain. More fluid or blood. Warmth. Pus or a bad smell. General instructions Do not take baths, swim, or use a hot tub until your health care provider approves. Do not lift anything that is heavier than 10 lb (4.5 kg) for a week, or as told by  your health care provider. Ask your health care provider when it is okay to: Return to work or school. Resume usual physical activities or sports. Do not drive for 24 hours if you were given a medicine to help you relax (sedative). Take over-the-counter and prescription medicines only as told by your health care provider. Wear a medical alert bracelet in case of an emergency. This will tell any health care providers that you have a port. Keep all follow-up visits as told by your health care provider. This is important. Contact a health care provider if: You cannot flush your port with saline as directed, or you cannot draw blood from the port. You have a fever or chills. You have more redness, swelling, or pain around your port insertion site. You have more fluid or blood coming from your port insertion site. Your port insertion site feels warm to the touch. You have pus or a bad smell coming from the port insertion site. Get help right away if: You have chest pain or shortness of breath. You have bleeding from your port that you cannot control. Summary Take care of the port as told by your health care provider. Change your dressing as told by your health care provider. Keep all follow-up visits as told by your health care provider. This information is not intended to replace advice given to you by your health care provider. Make sure you discuss any questions you have with your health care provider. Document Released: 07/26/2013 Document Revised:  08/26/2016 Document Reviewed: 08/26/2016 Elsevier Interactive Patient Education  2017 [place] An implanted port is a type of central line that is placed under the skin. Central lines are used to provide IV access when treatment or nutrition needs to be given through a persons veins. Implanted ports are used for long-term IV access. An implanted port may be placed because:  You need IV medicine that would be  irritating to the small veins in your hands or arms.  You need long-term IV medicines, such as antibiotics.  You need IV nutrition for a long period.  You need frequent blood draws for lab tests.  You need dialysis.  Implanted ports are usually placed in the chest area, but they can also be placed in the upper arm, the abdomen, or the leg. An implanted port has two main parts:  Reservoir. The reservoir is round and will appear as a small, raised area under your skin. The reservoir is the part where a needle is inserted to give medicines or draw blood.  Catheter. The catheter is a thin, flexible tube that extends from the reservoir. The catheter is placed into a large vein. Medicine that is inserted into the reservoir goes into the catheter and then into the vein.  How will I care for my incision site? Do not get the incision site wet. Bathe or shower as directed by your health care provider. How is my port accessed? Special steps must be taken to access the port:  Before the port is accessed, a numbing cream can be placed on the skin. This helps numb the skin over the port site.  Your health care provider uses a sterile technique to access the port. ? Your health care provider must put on a mask and sterile gloves. ? The skin over your port is cleaned carefully with an antiseptic and allowed to dry. ? The port is gently pinched between sterile gloves, and a needle is inserted into the port.  Only "non-coring" port needles should be used to access the port. Once the port is accessed, a blood return should be checked. This helps ensure that the port is in the vein and is not clogged.  If your port needs to remain accessed for a constant infusion, a clear (transparent) bandage will be placed over the needle site. The bandage and needle will need to be changed every week, or as directed by your health care provider.  Keep the bandage covering the needle clean and dry. Do not get it wet.  Follow your health care providers instructions on how to take a shower or bath while the port is accessed.  If your port does not need to stay accessed, no bandage is needed over the port.  What is flushing? Flushing helps keep the port from getting clogged. Follow your health care providers instructions on how and when to flush the port. Ports are usually flushed with saline solution or a medicine called heparin. The need for flushing will depend on how the port is used.  If the port is used for intermittent medicines or blood draws, the port will need to be flushed: ? After medicines have been given. ? After blood has been drawn. ? As part of routine maintenance.  If a constant infusion is running, the port may not need to be flushed.  How long will my port stay implanted? The port can stay in for as long as your health care provider thinks it is needed.  When it is time for the port to come out, surgery will be done to remove it. The procedure is similar to the one performed when the port was put in. When should I seek immediate medical care? When you have an implanted port, you should seek immediate medical care if:  You notice a bad smell coming from the incision site.  You have swelling, redness, or drainage at the incision site.  You have more swelling or pain at the port site or the surrounding area.  You have a fever that is not controlled with medicine.  This information is not intended to replace advice given to you by your health care provider. Make sure you discuss any questions you have with your health care provider. Document Released: 10/05/2005 Document Revised: 03/12/2016 Document Reviewed: 06/12/2013 Elsevier Interactive Patient Education  2017 [place]. Moderate Conscious Sedation, Adult, Care After These instructions provide you with information about caring for yourself after your procedure. Your health care provider may also give you more specific  instructions. Your treatment has been planned according to current medical practices, but problems sometimes occur. Call your health care provider if you have any problems or questions after your procedure. What can I expect after the procedure? After your procedure, it is common:  To feel sleepy for several hours.  To feel clumsy and have poor balance for several hours.  To have poor judgment for several hours.  To vomit if you eat too soon.  Follow these instructions at home: For at least 24 hours after the procedure:   Do not: ? Participate in activities where you could fall or become injured. ? Drive. ? Use heavy machinery. ? Drink alcohol. ? Take sleeping pills or medicines that cause drowsiness. ? Make important decisions or sign legal documents. ? Take care of children on your own.  Rest. Eating and drinking  Follow the diet recommended by your health care provider.  If you vomit: ? Drink water, juice, or soup when you can drink without vomiting. ? Make sure you have little or no nausea before eating solid foods. General instructions  Have a responsible adult stay with you until you are awake and alert.  Take over-the-counter and prescription medicines only as told by your health care provider.  If you smoke, do not smoke without supervision.  Keep all follow-up visits as told by your health care provider. This is important. Contact a health care provider if:  You keep feeling nauseous or you keep vomiting.  You feel light-headed.  You develop a rash.  You have a fever. Get help right away if:  You have trouble breathing. This information is not intended to replace advice given to you by your health care provider. Make sure you discuss any questions you have with your health care provider. Document Released: 07/26/2013 Document Revised: 03/09/2016 Document Reviewed: 01/25/2016 Elsevier Interactive Patient Education  [employer].

## 2017-12-24 ENCOUNTER — Inpatient Hospital Stay: Payer: Medicare Other

## 2017-12-24 ENCOUNTER — Inpatient Hospital Stay: Payer: Medicare Other | Attending: Oncology

## 2017-12-24 ENCOUNTER — Ambulatory Visit: Payer: Medicare Other | Admitting: Neurology

## 2017-12-24 VITALS — BP 144/60 | HR 70 | Temp 98.4°F | Resp 16

## 2017-12-24 DIAGNOSIS — Z5112 Encounter for antineoplastic immunotherapy: Secondary | ICD-10-CM | POA: Diagnosis not present

## 2017-12-24 DIAGNOSIS — C50412 Malignant neoplasm of upper-outer quadrant of left female breast: Secondary | ICD-10-CM

## 2017-12-24 DIAGNOSIS — C50912 Malignant neoplasm of unspecified site of left female breast: Secondary | ICD-10-CM

## 2017-12-24 DIAGNOSIS — Z170 Estrogen receptor positive status [ER+]: Secondary | ICD-10-CM | POA: Insufficient documentation

## 2017-12-24 DIAGNOSIS — C773 Secondary and unspecified malignant neoplasm of axilla and upper limb lymph nodes: Secondary | ICD-10-CM | POA: Insufficient documentation

## 2017-12-24 DIAGNOSIS — Z1501 Genetic susceptibility to malignant neoplasm of breast: Secondary | ICD-10-CM | POA: Diagnosis not present

## 2017-12-24 DIAGNOSIS — C7951 Secondary malignant neoplasm of bone: Secondary | ICD-10-CM

## 2017-12-24 DIAGNOSIS — C50811 Malignant neoplasm of overlapping sites of right female breast: Secondary | ICD-10-CM

## 2017-12-24 LAB — COMPREHENSIVE METABOLIC PANEL
ALBUMIN: 3.3 g/dL — AB (ref 3.5–5.0)
ALK PHOS: 86 U/L (ref 40–150)
ALT: 33 U/L (ref 0–55)
ANION GAP: 7 (ref 3–11)
AST: 29 U/L (ref 5–34)
BUN: 19 mg/dL (ref 7–26)
CALCIUM: 9.8 mg/dL (ref 8.4–10.4)
CO2: 31 mmol/L — AB (ref 22–29)
CREATININE: 0.79 mg/dL (ref 0.60–1.10)
Chloride: 102 mmol/L (ref 98–109)
GFR calc non Af Amer: >60 mL/min (ref >60)
GLUCOSE: 104 mg/dL (ref 70–140)
Potassium: 4.1 mmol/L (ref 3.5–5.1)
Sodium: 140 mmol/L (ref 136–145)
Total Bilirubin: 1 mg/dL (ref 0.2–1.2)
Total Protein: 6.3 g/dL — ABNORMAL LOW (ref 6.4–8.3)

## 2017-12-24 LAB — CBC WITH DIFFERENTIAL/PLATELET
Basophils Absolute: 0 10*3/uL (ref 0.0–0.1)
Basophils Relative: 0 %
Eosinophils Absolute: 0.1 10*3/uL (ref 0.0–0.5)
Eosinophils Relative: 1 %
HEMATOCRIT: 36.6 % (ref 34.8–46.6)
HEMOGLOBIN: 12.1 g/dL (ref 11.6–15.9)
LYMPHS ABS: 0.7 10*3/uL — AB (ref 0.9–3.3)
LYMPHS PCT: 9 %
MCH: 29.8 pg (ref 25.1–34.0)
MCHC: 33.2 g/dL (ref 31.5–36.0)
MCV: 90 fL (ref 79.5–101.0)
MONOS PCT: 12 %
Monocytes Absolute: 1 10*3/uL — ABNORMAL HIGH (ref 0.1–0.9)
NEUTROS PCT: 78 %
Neutro Abs: 6.1 10*3/uL (ref 1.5–6.5)
Platelets: 241 10*3/uL (ref 145–400)
RBC: 4.06 MIL/uL (ref 3.70–5.45)
RDW: 15.4 % — ABNORMAL HIGH (ref 11.2–14.5)
WBC: 7.9 10*3/uL (ref 3.9–10.3)

## 2017-12-24 MED ORDER — ACETAMINOPHEN 325 MG PO TABS
650.0000 mg | ORAL_TABLET | Freq: Once | ORAL | Status: AC
  Administered 2017-12-24: 650 mg via ORAL

## 2017-12-24 MED ORDER — HEPARIN SOD (PORK) LOCK FLUSH 100 UNIT/ML IV SOLN
500.0000 [IU] | Freq: Once | INTRAVENOUS | Status: AC | PRN
  Administered 2017-12-24: 500 [IU]
  Filled 2017-12-24: qty 5

## 2017-12-24 MED ORDER — DIPHENHYDRAMINE HCL 25 MG PO CAPS
25.0000 mg | ORAL_CAPSULE | Freq: Once | ORAL | Status: AC
  Administered 2017-12-24: 25 mg via ORAL

## 2017-12-24 MED ORDER — ACETAMINOPHEN 325 MG PO TABS
ORAL_TABLET | ORAL | Status: AC
  Filled 2017-12-24: qty 2

## 2017-12-24 MED ORDER — TRASTUZUMAB CHEMO 150 MG IV SOLR
4.0000 mg/kg | Freq: Once | INTRAVENOUS | Status: AC
  Administered 2017-12-24: 231 mg via INTRAVENOUS
  Filled 2017-12-24: qty 11

## 2017-12-24 MED ORDER — DIPHENHYDRAMINE HCL 25 MG PO CAPS
ORAL_CAPSULE | ORAL | Status: AC
  Filled 2017-12-24: qty 1

## 2017-12-24 MED ORDER — SODIUM CHLORIDE 0.9 % IV SOLN
Freq: Once | INTRAVENOUS | Status: AC
  Administered 2017-12-24: 11:00:00 via INTRAVENOUS

## 2017-12-24 MED ORDER — SODIUM CHLORIDE 0.9% FLUSH
10.0000 mL | INTRAVENOUS | Status: DC | PRN
  Administered 2017-12-24: 10 mL
  Filled 2017-12-24: qty 10

## 2017-12-24 NOTE — Patient Instructions (Signed)
[facility] Discharge Instructions for Patients Receiving Chemotherapy  Today you received the following chemotherapy agents Herceptin  To help prevent nausea and vomiting after your treatment, we encourage you to take your nausea medication as directed   If you develop nausea and vomiting that is not controlled by your nausea medication, call the clinic.   BELOW ARE SYMPTOMS THAT SHOULD BE REPORTED IMMEDIATELY:  *FEVER GREATER THAN 100.5 F  *CHILLS WITH OR WITHOUT FEVER  NAUSEA AND VOMITING THAT IS NOT CONTROLLED WITH YOUR NAUSEA MEDICATION  *UNUSUAL SHORTNESS OF BREATH  *UNUSUAL BRUISING OR BLEEDING  TENDERNESS IN MOUTH AND THROAT WITH OR WITHOUT PRESENCE OF ULCERS  *URINARY PROBLEMS  *BOWEL PROBLEMS  UNUSUAL RASH Items with * indicate a potential emergency and should be followed up as soon as possible.  Feel free to call the clinic should you have any questions or concerns. The clinic phone number is [phone].  Please show the CHEMO ALERT CARD at check-in to the Emergency Department and triage nurse.   

## 2018-01-07 ENCOUNTER — Inpatient Hospital Stay: Payer: Medicare Other

## 2018-01-07 VITALS — BP 158/64 | HR 73 | Temp 98.2°F | Resp 17 | Wt 127.5 lb

## 2018-01-07 DIAGNOSIS — Z170 Estrogen receptor positive status [ER+]: Principal | ICD-10-CM

## 2018-01-07 DIAGNOSIS — C50811 Malignant neoplasm of overlapping sites of right female breast: Secondary | ICD-10-CM

## 2018-01-07 DIAGNOSIS — C7951 Secondary malignant neoplasm of bone: Secondary | ICD-10-CM | POA: Diagnosis not present

## 2018-01-07 DIAGNOSIS — C50912 Malignant neoplasm of unspecified site of left female breast: Secondary | ICD-10-CM

## 2018-01-07 DIAGNOSIS — C50412 Malignant neoplasm of upper-outer quadrant of left female breast: Secondary | ICD-10-CM

## 2018-01-07 DIAGNOSIS — C773 Secondary and unspecified malignant neoplasm of axilla and upper limb lymph nodes: Secondary | ICD-10-CM | POA: Diagnosis not present

## 2018-01-07 DIAGNOSIS — Z5112 Encounter for antineoplastic immunotherapy: Secondary | ICD-10-CM | POA: Diagnosis not present

## 2018-01-07 DIAGNOSIS — Z1501 Genetic susceptibility to malignant neoplasm of breast: Secondary | ICD-10-CM | POA: Diagnosis not present

## 2018-01-07 LAB — COMPREHENSIVE METABOLIC PANEL
ALBUMIN: 3.3 g/dL — AB (ref 3.5–5.0)
ALT: 33 U/L (ref 0–55)
ANION GAP: 8 (ref 3–11)
AST: 35 U/L — ABNORMAL HIGH (ref 5–34)
Alkaline Phosphatase: 93 U/L (ref 40–150)
BUN: 14 mg/dL (ref 7–26)
CO2: 30 mmol/L — AB (ref 22–29)
Calcium: 10 mg/dL (ref 8.4–10.4)
Chloride: 101 mmol/L (ref 98–109)
Creatinine, Ser: 0.81 mg/dL (ref 0.60–1.10)
GFR calc Af Amer: >60 mL/min (ref >60)
GFR calc non Af Amer: >60 mL/min (ref >60)
GLUCOSE: 136 mg/dL (ref 70–140)
POTASSIUM: 4.1 mmol/L (ref 3.5–5.1)
SODIUM: 139 mmol/L (ref 136–145)
Total Bilirubin: 0.7 mg/dL (ref 0.2–1.2)
Total Protein: 6.7 g/dL (ref 6.4–8.3)

## 2018-01-07 LAB — CBC WITH DIFFERENTIAL/PLATELET
BASOS PCT: 0 %
Basophils Absolute: 0 10*3/uL (ref 0.0–0.1)
Eosinophils Absolute: 0.2 10*3/uL (ref 0.0–0.5)
Eosinophils Relative: 3 %
HCT: 39.5 % (ref 34.8–46.6)
Hemoglobin: 12.6 g/dL (ref 11.6–15.9)
Lymphocytes Relative: 9 %
Lymphs Abs: 0.7 10*3/uL — ABNORMAL LOW (ref 0.9–3.3)
MCH: 29.2 pg (ref 25.1–34.0)
MCHC: 31.9 g/dL (ref 31.5–36.0)
MCV: 91.4 fL (ref 79.5–101.0)
MONO ABS: 1 10*3/uL — AB (ref 0.1–0.9)
MONOS PCT: 14 %
NEUTROS PCT: 74 %
Neutro Abs: 5.2 10*3/uL (ref 1.5–6.5)
Platelets: 225 10*3/uL (ref 145–400)
RBC: 4.32 MIL/uL (ref 3.70–5.45)
RDW: 14.3 % (ref 11.2–14.5)
WBC: 7.1 10*3/uL (ref 3.9–10.3)

## 2018-01-07 MED ORDER — ACETAMINOPHEN 325 MG PO TABS
ORAL_TABLET | ORAL | Status: AC
  Filled 2018-01-07: qty 2

## 2018-01-07 MED ORDER — ACETAMINOPHEN 325 MG PO TABS
650.0000 mg | ORAL_TABLET | Freq: Once | ORAL | Status: AC
  Administered 2018-01-07: 650 mg via ORAL

## 2018-01-07 MED ORDER — DIPHENHYDRAMINE HCL 25 MG PO CAPS
25.0000 mg | ORAL_CAPSULE | Freq: Once | ORAL | Status: AC
  Administered 2018-01-07: 25 mg via ORAL

## 2018-01-07 MED ORDER — HEPARIN SOD (PORK) LOCK FLUSH 100 UNIT/ML IV SOLN
500.0000 [IU] | Freq: Once | INTRAVENOUS | Status: AC | PRN
  Administered 2018-01-07: 500 [IU]
  Filled 2018-01-07: qty 5

## 2018-01-07 MED ORDER — DIPHENHYDRAMINE HCL 25 MG PO CAPS
ORAL_CAPSULE | ORAL | Status: AC
  Filled 2018-01-07: qty 1

## 2018-01-07 MED ORDER — SODIUM CHLORIDE 0.9 % IV SOLN
Freq: Once | INTRAVENOUS | Status: AC
  Administered 2018-01-07: 12:00:00 via INTRAVENOUS

## 2018-01-07 MED ORDER — TRASTUZUMAB CHEMO 150 MG IV SOLR
4.0000 mg/kg | Freq: Once | INTRAVENOUS | Status: AC
  Administered 2018-01-07: 231 mg via INTRAVENOUS
  Filled 2018-01-07: qty 11

## 2018-01-07 MED ORDER — SODIUM CHLORIDE 0.9% FLUSH
10.0000 mL | INTRAVENOUS | Status: DC | PRN
  Administered 2018-01-07: 10 mL
  Filled 2018-01-07: qty 10

## 2018-01-07 NOTE — Patient Instructions (Signed)
[facility] Discharge Instructions for Patients Receiving Chemotherapy  Today you received the following chemotherapy agents Herceptin  To help prevent nausea and vomiting after your treatment, we encourage you to take your nausea medication as directed   If you develop nausea and vomiting that is not controlled by your nausea medication, call the clinic.   BELOW ARE SYMPTOMS THAT SHOULD BE REPORTED IMMEDIATELY:  *FEVER GREATER THAN 100.5 F  *CHILLS WITH OR WITHOUT FEVER  NAUSEA AND VOMITING THAT IS NOT CONTROLLED WITH YOUR NAUSEA MEDICATION  *UNUSUAL SHORTNESS OF BREATH  *UNUSUAL BRUISING OR BLEEDING  TENDERNESS IN MOUTH AND THROAT WITH OR WITHOUT PRESENCE OF ULCERS  *URINARY PROBLEMS  *BOWEL PROBLEMS  UNUSUAL RASH Items with * indicate a potential emergency and should be followed up as soon as possible.  Feel free to call the clinic should you have any questions or concerns. The clinic phone number is [phone].  Please show the CHEMO ALERT CARD at check-in to the Emergency Department and triage nurse.   

## 2018-01-14 ENCOUNTER — Other Ambulatory Visit: Payer: Self-pay | Admitting: Oncology

## 2018-01-14 DIAGNOSIS — Z170 Estrogen receptor positive status [ER+]: Secondary | ICD-10-CM

## 2018-01-14 DIAGNOSIS — C50412 Malignant neoplasm of upper-outer quadrant of left female breast: Secondary | ICD-10-CM

## 2018-01-14 DIAGNOSIS — C7951 Secondary malignant neoplasm of bone: Principal | ICD-10-CM

## 2018-01-14 DIAGNOSIS — C50811 Malignant neoplasm of overlapping sites of right female breast: Secondary | ICD-10-CM

## 2018-01-14 DIAGNOSIS — C50919 Malignant neoplasm of unspecified site of unspecified female breast: Secondary | ICD-10-CM

## 2018-01-14 DIAGNOSIS — Z7189 Other specified counseling: Secondary | ICD-10-CM

## 2018-01-14 DIAGNOSIS — C3492 Malignant neoplasm of unspecified part of left bronchus or lung: Secondary | ICD-10-CM

## 2018-01-19 ENCOUNTER — Other Ambulatory Visit: Payer: Medicare Other

## 2018-01-19 ENCOUNTER — Other Ambulatory Visit: Payer: Self-pay | Admitting: Oncology

## 2018-01-19 ENCOUNTER — Ambulatory Visit
Admission: RE | Admit: 2018-01-19 | Discharge: 2018-01-19 | Disposition: A | Discharge status: Home / Self Care | Payer: Medicare Other | Source: Ambulatory Visit | Attending: Oncology | Admitting: Oncology

## 2018-01-19 DIAGNOSIS — C50412 Malignant neoplasm of upper-outer quadrant of left female breast: Secondary | ICD-10-CM

## 2018-01-19 DIAGNOSIS — C7951 Secondary malignant neoplasm of bone: Principal | ICD-10-CM

## 2018-01-19 DIAGNOSIS — Z7189 Other specified counseling: Secondary | ICD-10-CM

## 2018-01-19 DIAGNOSIS — C50811 Malignant neoplasm of overlapping sites of right female breast: Secondary | ICD-10-CM

## 2018-01-19 DIAGNOSIS — Z170 Estrogen receptor positive status [ER+]: Secondary | ICD-10-CM

## 2018-01-19 DIAGNOSIS — C50919 Malignant neoplasm of unspecified site of unspecified female breast: Secondary | ICD-10-CM

## 2018-01-19 DIAGNOSIS — C50912 Malignant neoplasm of unspecified site of left female breast: Secondary | ICD-10-CM | POA: Diagnosis not present

## 2018-01-19 DIAGNOSIS — C3492 Malignant neoplasm of unspecified part of left bronchus or lung: Secondary | ICD-10-CM

## 2018-01-19 DIAGNOSIS — R928 Other abnormal and inconclusive findings on diagnostic imaging of breast: Secondary | ICD-10-CM | POA: Diagnosis not present

## 2018-01-19 DIAGNOSIS — C50911 Malignant neoplasm of unspecified site of right female breast: Secondary | ICD-10-CM | POA: Diagnosis not present

## 2018-01-20 ENCOUNTER — Telehealth: Payer: Self-pay | Admitting: Adult Health

## 2018-01-20 NOTE — Telephone Encounter (Signed)
Received call from patient daughter, Lauren Buchanan concerned about progression on recent mammo and ultrasound done yesterday.  She wanted to know if she can review with Dr. Jana Hakim tomorrow, before her mom, Lauren Buchanan, receives Herceptin tomorrow.  This was reviewed with Dr. Jana Hakim, who stated patient was added to his schedule after her lab and before her treatment tomorrow.  I returned call to [facility] and relayed this information.  She verbalized understanding and appreciation.    Wilber Bihari, NP

## 2018-01-20 NOTE — Progress Notes (Signed)
[place]  Telephone:([phone] Fax:([phone]) [phone]   ID: Lauren Buchanan DOB: [birth year]  MR#: [id number]  BOF#:[id number]  Patient Care Team: Dorena Cookey, MD as PCP - General (Family Medicine) Fanny Skates, MD as Consulting Physician (General Surgery) [name], [name], MD as Consulting Physician (Oncology) [name], MD as Consulting Physician (Radiation Oncology) Rockwell Germany, RN as Registered Nurse Mauro Kaufmann, RN as Registered Nurse Gentry Fitz, MD as Consulting Physician (Family Medicine) [name], [name], NP as Nurse Practitioner (Internal Medicine) Thompson Grayer, MD as Consulting Physician (Cardiology) PCP: Dorena Cookey, MD GYN: OTHER MD: Danton Sewer MD, Thompson Grayer MD, Unice Bailey MD, Rolm Bookbinder MD  CHIEF COMPLAINT: bilateral estrogen receptor positive breast cancer, right-sided HER-2 positive breast cancer,  bone metastases  CURRENT TREATMENT: zolendronate, exemestane, everolimus, trastuzumab  NOTE: Patient requests her daughter Lauren Buchanan be contacted regarding all appointments and procedures. [name] can be reached at [phone]  INTERVAL HISTORY: Lauren Buchanan returns today for follow-up and treatment of her bilateral estrogen receptor positive breast cancer (and right-sided HER-2 positive breast cancer) accompanied by her daughter, Lauren Buchanan.   She is being treated with exemestane and very low-dose everolimus, which was started 12/10/2017.  She is not aware of any side effects from these medications  She also is receiving trastuzumab, with her fifth dose on 01/07/2018.  She tolerates this very well.  Recall her echocardiogram on 11/02/2017 showed an excellent ejection fraction at 55-60%.  She is already scheduled for repeat echo later this month  On 01/19/2018 she underwent Digital Diagnostic Bilateral Mammogram with CAD and TOMO, breast density category B, showing progression of multicentric malignancy involving the left breast. A mass which  is now fungating through the skin of the upper outer quadrant has increased in size and there is a new mass in the upper outer quadrant at the 2 o'clock position. No significant change in the size of the biopsy-proven malignancy in the subareolar right breast, though the mass has less density which suggest treatment response.    REVIEW OF SYSTEMS: Lauren Buchanan is doing well overall. She does report sinus drainage, which is manageable. She denies unusual headaches, visual changes, nausea, vomiting, or dizziness. There has been no unusual cough, or pleurisy. This been no change in bowel or bladder habits. She denies unexplained fatigue or unexplained weight loss, bleeding, rash, or fever. A detailed review of systems was otherwise noncontributory.    BREAST CANCER HISTORY: From the original intake note:  "Lauren Buchanan"  Has been aware of a sore right nipple for the last 6-8 months. She attributed it to her washing machine, which rubs that area when she uses it. On  11/21/2014 she saw Dr. 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 She mentioned the chest wall problem and Dr. Ubaldo Glassing obtained to skin biopsies 1 from the right inferior central chest and the other one of from the right mid medial chest.  The second of these showed a nodular basal cell carcinoma. The first one however showed invasive carcinoma consistent with a breast primary. This tumor was estrogen receptor positive at 100%, progesterone receptor positive at 36%, both with strong staining intensity, and HER-2 was amplified , the signals ratio being 6.2. [name] [id number])  On 12/06/2014 the patient underwent bilateral diagnostic mammography with bilateral ultrasonography. Breast density was category B. There was bilateral nipple retraction. In the left breast upper outer quadrant there was a spiculated mass noted by mammography with a second spiculated  mass in the lower inner quadrant. By ultrasound there was a 1.6 cm hypoechoic lesion at  the 1:30 o'clock position in the left breast, and a second mass more medially measuring 0.9 cm. Also at the 8:00 position in the left breast 2 cm from the nipple there was a 1.3 cm mass. Ultrasound of the left axilla showed an enlarged lymph node with fatty hilum replacement, measuring 1.4 cm.  In the right breast mammography showed a cluster of indeterminate microcalcifications in the upper outer quadrant and a retroareolar mass, which bile does sound measured 2.9 cm. Ultrasound of the right axilla showed a right axilla lymph node measuring 1.4 cm.  On 12/06/2014 the patient underwent needle core biopsy of 2 separate masses in the left breast (at 1:00 and 8:00) as well as the enlarged axillary lymph node. All 3 were positive for an invasive lobular carcinoma (E-cadherin negative). All 3 tumors were estrogen receptor positive with strong staining intensity (between 87% and 100%). The lymph node was progesterone receptor negative, the other 2 masses being progesterone receptor positive at 9% and at 76%. This cancer was reported to be HER-2 positive at conference today, though I do not have the ratios at hand.  The patient is not an MRI candidate because she has a permanent pacemaker in place. Her subsequent history is as detailed below    PAST MEDICAL HISTORY: Past Medical History:  Diagnosis Date  . Asthma   . Breast cancer ([place]) 12/08/2014   Bilateral  . Cancer of central portion of female breast ([place]) 12/04/2014  . Cancer of central portion of female breast ([place]) 12/04/2014  . Complete heart block ([place]) 1999   s/p PPM by Dr Olevia Perches, most recent generator change 2009  . COPD (chronic obstructive pulmonary disease) ([place])   . Diverticulosis   . DJD (degenerative joint disease)   . Esophageal stricture   . GERD (gastroesophageal reflux disease)   . Giant cell arteritis ([place])   . Goiter   . Hemorrhoids   . Hiatal hernia   . IBS (irritable bowel syndrome)   . Lung cancer ([place])   . Paroxysmal  atrial fibrillation ([place]) 12/17/2015   asymptomatic, <1%, determined on regular pacemaker interrogation  . Pneumonia   . Renal cyst   . S/P radiation therapy 04/24/15 completed   T-11  . Wears glasses     PAST SURGICAL HISTORY: Past Surgical History:  Procedure Laterality Date  . ANAL FISSURE REPAIR    . ARTERY BIOPSY Left 01/03/2013   Procedure: BIOPSY LEFT TEMPORAL ARTERY;  Surgeon: Ascencion Dike, MD;  Location: [facility];  Service: ENT;  Laterality: Left;  . BREAST BIOPSY Right   . CHOLECYSTECTOMY    . COLONOSCOPY    . FRACTURE SURGERY     fx lt wrist  . hemorrhoidectomy    . IR FLUORO GUIDE PORT INSERTION RIGHT  12/22/2017  . IR US GUIDE VASC ACCESS RIGHT  12/22/2017  . left lower lobectomy for brochiectasis  1950  . MOUTH SURGERY    . PACEMAKER INSERTION  1999   Gen change ([facility]) by Dr Olevia Perches 2009  . RECTAL POLYPECTOMY    . RENAL CYST EXCISION    . TONSILLECTOMY      FAMILY HISTORY Family History  Problem Relation Age of Onset  . Heart disease Mother   . Breast cancer Mother   . Stomach cancer Father   . Alcohol abuse Father   . Seizures Son   . Thyroid disease  Sister    the patient's father died at the age of 68, with stomach cancer. The patient's mother died at the age of 74 from a myocardial infarction. She had been diagnosed with breast cancer in her 72s. The patient had no brothers, 2 sisters. There is no other history of breast or ovarian cancer in the family to the patient's knowledge  GYNECOLOGIC HISTORY:  No LMP recorded. Patient is postmenopausal. Menarche age 63, first live birth age 82. The patient is GX P4. She underwent menopause in her late 64s. She did not take hormone replacement.  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 She is a widow, lives by herself, with no pets. Daughter Wendy Poet lives in [place] where she is Mudlogger of a preschool and day care. Son Taneshia Lorence lives in [place] where he  owns a furniture to sign store. Daughter Shelly Rubenstein lives in [place]. She owns a Chiropractor. Son Randall Hiss is physically and mentally disabled and lives in a group home in [place]. The patient has 7 grandchildren and 2 great-grandchildren. She attends a local [place]: In place; the patient does not wish to be resuscitated in case of a terminal event; the patient's daughter Lauren Buchanan is her healthcare power of attorney. She can be reached at [phone]. The patient has a DO NOT RESUSCITATE order in place at [facility].  HEALTH MAINTENANCE: Social History   Tobacco Use  . Smoking status: Never Smoker  . Smokeless tobacco: Never Used  Substance Use Topics  . Alcohol use: Yes    Alcohol/week: 0.0 oz    Comment: Occasional   . Drug use: No     Colonoscopy:  PAP:  Bone density:02/19/2015, osteoporosis  Lipid panel:  Allergies  Allergen Reactions  . Morphine And Related Nausea Only  . Amoxicillin-Pot Clavulanate Other (See Comments)    Unknown     Current Outpatient Medications  Medication Sig Dispense Refill  . Calcium Citrate-Vitamin D (CITRACAL PETITES/VITAMIN D) 200-250 MG-UNIT TABS Take 1 tablet by mouth daily.      . diclofenac sodium (VOLTAREN) 1 % GEL Apply topically 3 (three) times daily as needed.    . everolimus (AFINITOR) 2.5 MG tablet Take 1 tablet (2.5 mg total) by mouth daily. Caution:chemotherapy. 30 tablet 12  . exemestane (AROMASIN) 25 MG tablet Take 1 tablet (25 mg total) by mouth daily after breakfast. 90 tablet 4  . lactose free nutrition (BOOST) LIQD Take 237 mLs daily by mouth.    . metoprolol succinate (TOPROL-XL) 50 MG 24 hr tablet Take 50 mg by mouth daily. Take with or immediately following a meal.    . predniSONE (DELTASONE) 5 MG tablet Take 5 mg by mouth daily with breakfast. Reported on 11/19/2015    . traMADol (ULTRAM) 50 MG tablet Take 50 mg by mouth every 6 (six) hours as needed.     . TURMERIC PO Take 500 mg by mouth daily.       No current facility-administered medications for this visit.     OBJECTIVE: Elderly white woman who appears stated age  76:   01/21/18 0939  BP: (!) 162/69  Pulse: (!) 106  Resp: 18  Temp: 98 F (36.7 C)  SpO2: 100%     Body mass index is 24.55 kg/m.    ECOG FS:1 - Symptomatic but completely ambulatory  Sclerae unicteric, pupils round and equal No cervical or supraclavicular adenopathy Lungs no rales  or rhonchi Heart regular rate and rhythm Abd soft, nontender, positive bowel sounds MSK no focal spinal tenderness, no upper extremity lymphedema Neuro: nonfocal, positive affect Breasts: The left breast has a lesion in the upper outer quadrant which measures about 2 x 1 cm, and is clearly an ulcerated tumor mass.  There is no overlying erythema or evidence of inflammatory disease.  The mass is movable.     Left Breast 12/10/2017     LAB RESULTS:  CMP     Component Value Date/Time   NA 139 01/07/2018 0959   NA 136 09/29/2017 1435   K 4.1 01/07/2018 0959   K 4.1 09/29/2017 1435   CL 101 01/07/2018 0959   CO2 30 (H) 01/07/2018 0959   CO2 27 09/29/2017 1435   GLUCOSE 136 01/07/2018 0959   GLUCOSE 148 (H) 09/29/2017 1435   BUN 14 01/07/2018 0959   BUN 14.9 09/29/2017 1435   CREATININE 0.81 01/07/2018 0959   CREATININE 1.0 09/29/2017 1435   CALCIUM 10.0 01/07/2018 0959   CALCIUM 9.3 09/29/2017 1435   PROT 6.7 01/07/2018 0959   PROT 7.0 09/29/2017 1435   ALBUMIN 3.3 (L) 01/07/2018 0959   ALBUMIN 3.6 09/29/2017 1435   AST 35 (H) 01/07/2018 0959   AST 28 09/29/2017 1435   ALT 33 01/07/2018 0959   ALT 22 09/29/2017 1435   ALKPHOS 93 01/07/2018 0959   ALKPHOS 89 09/29/2017 1435   BILITOT 0.7 01/07/2018 0959   BILITOT 1.12 09/29/2017 1435   GFRNONAA >60 01/07/2018 0959   GFRAA >60 01/07/2018 0959    INo results found for: SPEP, UPEP  Lab Results  Component Value Date   WBC 7.1 01/07/2018   NEUTROABS 5.2 01/07/2018   HGB 12.6 01/07/2018   HCT  39.5 01/07/2018   MCV 91.4 01/07/2018   PLT 225 01/07/2018      Chemistry      Component Value Date/Time   NA 139 01/07/2018 0959   NA 136 09/29/2017 1435   K 4.1 01/07/2018 0959   K 4.1 09/29/2017 1435   CL 101 01/07/2018 0959   CO2 30 (H) 01/07/2018 0959   CO2 27 09/29/2017 1435   BUN 14 01/07/2018 0959   BUN 14.9 09/29/2017 1435   CREATININE 0.81 01/07/2018 0959   CREATININE 1.0 09/29/2017 1435   GLU 94 02/23/2017      Component Value Date/Time   CALCIUM 10.0 01/07/2018 0959   CALCIUM 9.3 09/29/2017 1435   ALKPHOS 93 01/07/2018 0959   ALKPHOS 89 09/29/2017 1435   AST 35 (H) 01/07/2018 0959   AST 28 09/29/2017 1435   ALT 33 01/07/2018 0959   ALT 22 09/29/2017 1435   BILITOT 0.7 01/07/2018 0959   BILITOT 1.12 09/29/2017 1435       Lab Results  Component Value Date   LABCA2 20 04/01/2016    No components found for: [id number]  No results for input(s): INR in the last 168 hours.  Urinalysis    Component Value Date/Time   COLORURINE COLORLESS (A) 11/29/2016 1707   APPEARANCEUR CLEAR 11/29/2016 1707   LABSPEC 1.002 (L) 11/29/2016 1707   PHURINE 7.0 11/29/2016 1707   GLUCOSEU NEGATIVE 11/29/2016 1707   HGBUR SMALL (A) 11/29/2016 1707   HGBUR trace-intact 01/17/2010 0932   BILIRUBINUR NEGATIVE 11/29/2016 1707   BILIRUBINUR n 09/01/2011 [place] 11/29/2016 1707   PROTEINUR NEGATIVE 11/29/2016 1707   UROBILINOGEN 0.2 09/27/2014 1800   NITRITE NEGATIVE 11/29/2016 1707   LEUKOCYTESUR NEGATIVE 11/29/2016  1707    STUDIES: Ir US Guide Vasc Access Right  Result Date: 12/22/2017 CLINICAL DATA:  Breast cancer. Needs durable venous access for chemotherapy regimen. EXAM: TUNNELED PORT CATHETER PLACEMENT WITH ULTRASOUND AND FLUOROSCOPIC GUIDANCE FLUOROSCOPY TIME:  3.2 seconds; 0.95 mGy ANESTHESIA/SEDATION: Intravenous Fentanyl and Versed were administered as conscious sedation during continuous monitoring of the patient's level of consciousness and  physiological / cardiorespiratory status by the radiology RN, with a total moderate sedation time of 18 minutes. TECHNIQUE: The procedure, risks, benefits, and alternatives were explained to the patient and daughter. Questions regarding the procedure were encouraged and answered. The daughter understands and consents to the procedure. As antibiotic prophylaxis, Cleocin 600 mg was ordered pre-procedure and administered intravenously within one hour of incision. Patency of the right IJ vein was confirmed with ultrasound with image documentation. An appropriate skin site was determined. Skin site was marked. Region was prepped using maximum barrier technique including cap and mask, sterile gown, sterile gloves, large sterile sheet, and Chlorhexidine as cutaneous antisepsis. The region was infiltrated locally with 1% lidocaine. Under real-time ultrasound guidance, the right IJ vein was accessed with a 21 gauge micropuncture needle; the needle tip within the vein was confirmed with ultrasound image documentation. Needle was exchanged over a 018 guidewire for transitional dilator which allowed passage of the [facility] The wire into the IVC. Over this, the transitional dilator was exchanged for a 5 [place] MPA catheter. A small incision was made on the right anterior chest wall and a subcutaneous pocket fashioned. The power-injectable port was positioned and its catheter tunneled to the right IJ dermatotomy site. The MPA catheter was exchanged over an Amplatz wire for a peel-away sheath, through which the port catheter, which had been trimmed to the appropriate length, was advanced and positioned under fluoroscopy with its tip at the cavoatrial junction. Spot chest radiograph confirms good catheter position and no pneumothorax. The pocket was closed with deep interrupted and subcuticular continuous 3-0 Monocryl sutures. The port was flushed per protocol. The incisions were covered with Dermabond then covered with a sterile  dressing. COMPLICATIONS: COMPLICATIONS None immediate IMPRESSION: Technically successful right IJ power-injectable port catheter placement. Ready for routine use. Electronically Signed   By: Lucrezia Europe M.D.   On: 12/22/2017 14:10   US Breast Ltd Uni Left Inc Axilla  Result Date: 01/19/2018 CLINICAL DATA:  84 year old with metastatic bilateral breast cancer (metastatic to left axillary lymph nodes and the T11 vertebral body). Currently undergoing therapy with trastuzumab, zolendronate, and capecitabine. Diagnostic imaging performed to evaluate treatment response. EXAM: DIGITAL DIAGNOSTIC BILATERAL MAMMOGRAM WITH CAD AND TOMO LIMITED ULTRASOUND BILATERAL BREASTS COMPARISON:  Mammography 02/05/2016 (bilateral), 09/23/2015 (right), 12/06/2014 (left) and earlier. Bilateral breast ultrasound 07/15/2016, 02/20/2016 and earlier. ACR Breast Density Category b: There are scattered areas of fibroglandular density. FINDINGS: Tomosynthesis and synthesized full field CC and MLO views of both breasts were obtained. The biopsy-proven malignancy involving the upper outer quadrant of the left breast at middle to posterior depth, associated with architectural distortion, containing a coil shaped tissue marker clip, has not significantly changed since the most recent prior mammogram in April, 2017. However, the adjacent mass in the upper outer quadrant at posterior depth has significantly increased in size. The biopsy-proven malignancy involving the lower inner quadrant of the left breast at middle depth, associated with architectural distortion, containing a ribbon shaped tissue marker clip, has also not significantly changed in size since the most recent prior mammogram. The battery generator pack for the patient's indwelling pacemaker overlies  the left axilla on the MLO view. The biopsy-proven malignancy in the subareolar right breast demonstrates less density on today's examination. There are associated scattered  microcalcifications and there may be mild nipple retraction currently. A 6 mm group of calcifications in the upper outer quadrant at middle depth which have a linear orientation directed toward the nipple are not significantly changed since the most recent prior mammogram. The port for the indwelling Port-A-Cath overlies the right axilla on the MLO view. Mammographic images were processed with CAD. On physical exam, there is a fungating mass extending through the skin of the upper outer quadrant of the left breast which correlates with the mammographically identified enlarging mass in the upper outer quadrant. Targeted left breast ultrasound is performed, showing evidence of multicentric malignancy. The individual breast masses are as follows: 1. Biopsy-proven malignancy at 8 o'clock 2 cm from the nipple, associated with architectural distortion: 0.6 x 0.7 x 0.7 cm (previously 0.7 x 0.5 x 0.6 cm). 2. Biopsy-proven malignancy at 1:30 o'clock 4 cm from the nipple, associated with architectural distortion: 2.0 x 0.8 x 1.3 cm (previously 1.5 x 1.0 x 1.2 cm). 3. Hypoechoic mass involving the skin at the 1:30 o'clock position 4 cm from the nipple: 1.6 x 1.3 x 1.7 cm (previously 0.2 x 0.2 x 0.4 cm). 4. New hypoechoic mass at the 2 o'clock position 3 cm from the nipple: 0.6 x 0.6 x 0.6 cm. Targeted right breast ultrasound is performed, showing the previously identified biopsy-proven malignancy in the subareolar location with extension into the skin of the areola, measuring approximately 2.2 x 1.8 x 1.6 cm (previously 2.4 x 1.6 x 1.7 cm). IMPRESSION: 1. Progression of multicentric malignancy involving the left breast. While the previously biopsied masses have not significantly changed since September, 2017, a mass which is now fungating through the skin of the upper outer quadrant has increased in size and there is a new mass in the upper outer quadrant at the 2 o'clock position. 2. No significant change in the size of the  biopsy-proven malignancy in the subareolar right breast, though the mass has less density which suggests treatment response. RECOMMENDATION: Treatment plan. I have discussed the findings and recommendations with the patient and her daughter. Results were also provided in writing at the conclusion of the visit. If applicable, a reminder letter will be sent to the patient regarding the next appointment. BI-RADS CATEGORY  6: Known biopsy-proven malignancy. Electronically Signed   By: Evangeline Dakin M.D.   On: 01/19/2018 17:13   US Breast Ltd Uni Right Inc Axilla  Result Date: 01/19/2018 CLINICAL DATA:  84 year old with metastatic bilateral breast cancer (metastatic to left axillary lymph nodes and the T11 vertebral body). Currently undergoing therapy with trastuzumab, zolendronate, and capecitabine. Diagnostic imaging performed to evaluate treatment response. EXAM: DIGITAL DIAGNOSTIC BILATERAL MAMMOGRAM WITH CAD AND TOMO LIMITED ULTRASOUND BILATERAL BREASTS COMPARISON:  Mammography 02/05/2016 (bilateral), 09/23/2015 (right), 12/06/2014 (left) and earlier. Bilateral breast ultrasound 07/15/2016, 02/20/2016 and earlier. ACR Breast Density Category b: There are scattered areas of fibroglandular density. FINDINGS: Tomosynthesis and synthesized full field CC and MLO views of both breasts were obtained. The biopsy-proven malignancy involving the upper outer quadrant of the left breast at middle to posterior depth, associated with architectural distortion, containing a coil shaped tissue marker clip, has not significantly changed since the most recent prior mammogram in April, 2017. However, the adjacent mass in the upper outer quadrant at posterior depth has significantly increased in size. The biopsy-proven malignancy involving the  lower inner quadrant of the left breast at middle depth, associated with architectural distortion, containing a ribbon shaped tissue marker clip, has also not significantly changed in size  since the most recent prior mammogram. The battery generator pack for the patient's indwelling pacemaker overlies the left axilla on the MLO view. The biopsy-proven malignancy in the subareolar right breast demonstrates less density on today's examination. There are associated scattered microcalcifications and there may be mild nipple retraction currently. A 6 mm group of calcifications in the upper outer quadrant at middle depth which have a linear orientation directed toward the nipple are not significantly changed since the most recent prior mammogram. The port for the indwelling Port-A-Cath overlies the right axilla on the MLO view. Mammographic images were processed with CAD. On physical exam, there is a fungating mass extending through the skin of the upper outer quadrant of the left breast which correlates with the mammographically identified enlarging mass in the upper outer quadrant. Targeted left breast ultrasound is performed, showing evidence of multicentric malignancy. The individual breast masses are as follows: 1. Biopsy-proven malignancy at 8 o'clock 2 cm from the nipple, associated with architectural distortion: 0.6 x 0.7 x 0.7 cm (previously 0.7 x 0.5 x 0.6 cm). 2. Biopsy-proven malignancy at 1:30 o'clock 4 cm from the nipple, associated with architectural distortion: 2.0 x 0.8 x 1.3 cm (previously 1.5 x 1.0 x 1.2 cm). 3. Hypoechoic mass involving the skin at the 1:30 o'clock position 4 cm from the nipple: 1.6 x 1.3 x 1.7 cm (previously 0.2 x 0.2 x 0.4 cm). 4. New hypoechoic mass at the 2 o'clock position 3 cm from the nipple: 0.6 x 0.6 x 0.6 cm. Targeted right breast ultrasound is performed, showing the previously identified biopsy-proven malignancy in the subareolar location with extension into the skin of the areola, measuring approximately 2.2 x 1.8 x 1.6 cm (previously 2.4 x 1.6 x 1.7 cm). IMPRESSION: 1. Progression of multicentric malignancy involving the left breast. While the previously  biopsied masses have not significantly changed since September, 2017, a mass which is now fungating through the skin of the upper outer quadrant has increased in size and there is a new mass in the upper outer quadrant at the 2 o'clock position. 2. No significant change in the size of the biopsy-proven malignancy in the subareolar right breast, though the mass has less density which suggests treatment response. RECOMMENDATION: Treatment plan. I have discussed the findings and recommendations with the patient and her daughter. Results were also provided in writing at the conclusion of the visit. If applicable, a reminder letter will be sent to the patient regarding the next appointment. BI-RADS CATEGORY  6: Known biopsy-proven malignancy. Electronically Signed   By: Evangeline Dakin M.D.   On: 01/19/2018 17:13   Mm Diag Breast Tomo Bilateral  Result Date: 01/19/2018 CLINICAL DATA:  84 year old with metastatic bilateral breast cancer (metastatic to left axillary lymph nodes and the T11 vertebral body). Currently undergoing therapy with trastuzumab, zolendronate, and capecitabine. Diagnostic imaging performed to evaluate treatment response. EXAM: DIGITAL DIAGNOSTIC BILATERAL MAMMOGRAM WITH CAD AND TOMO LIMITED ULTRASOUND BILATERAL BREASTS COMPARISON:  Mammography 02/05/2016 (bilateral), 09/23/2015 (right), 12/06/2014 (left) and earlier. Bilateral breast ultrasound 07/15/2016, 02/20/2016 and earlier. ACR Breast Density Category b: There are scattered areas of fibroglandular density. FINDINGS: Tomosynthesis and synthesized full field CC and MLO views of both breasts were obtained. The biopsy-proven malignancy involving the upper outer quadrant of the left breast at middle to posterior depth, associated with architectural distortion,  containing a coil shaped tissue marker clip, has not significantly changed since the most recent prior mammogram in April, 2017. However, the adjacent mass in the upper outer quadrant at  posterior depth has significantly increased in size. The biopsy-proven malignancy involving the lower inner quadrant of the left breast at middle depth, associated with architectural distortion, containing a ribbon shaped tissue marker clip, has also not significantly changed in size since the most recent prior mammogram. The battery generator pack for the patient's indwelling pacemaker overlies the left axilla on the MLO view. The biopsy-proven malignancy in the subareolar right breast demonstrates less density on today's examination. There are associated scattered microcalcifications and there may be mild nipple retraction currently. A 6 mm group of calcifications in the upper outer quadrant at middle depth which have a linear orientation directed toward the nipple are not significantly changed since the most recent prior mammogram. The port for the indwelling Port-A-Cath overlies the right axilla on the MLO view. Mammographic images were processed with CAD. On physical exam, there is a fungating mass extending through the skin of the upper outer quadrant of the left breast which correlates with the mammographically identified enlarging mass in the upper outer quadrant. Targeted left breast ultrasound is performed, showing evidence of multicentric malignancy. The individual breast masses are as follows: 1. Biopsy-proven malignancy at 8 o'clock 2 cm from the nipple, associated with architectural distortion: 0.6 x 0.7 x 0.7 cm (previously 0.7 x 0.5 x 0.6 cm). 2. Biopsy-proven malignancy at 1:30 o'clock 4 cm from the nipple, associated with architectural distortion: 2.0 x 0.8 x 1.3 cm (previously 1.5 x 1.0 x 1.2 cm). 3. Hypoechoic mass involving the skin at the 1:30 o'clock position 4 cm from the nipple: 1.6 x 1.3 x 1.7 cm (previously 0.2 x 0.2 x 0.4 cm). 4. New hypoechoic mass at the 2 o'clock position 3 cm from the nipple: 0.6 x 0.6 x 0.6 cm. Targeted right breast ultrasound is performed, showing the previously  identified biopsy-proven malignancy in the subareolar location with extension into the skin of the areola, measuring approximately 2.2 x 1.8 x 1.6 cm (previously 2.4 x 1.6 x 1.7 cm). IMPRESSION: 1. Progression of multicentric malignancy involving the left breast. While the previously biopsied masses have not significantly changed since September, 2017, a mass which is now fungating through the skin of the upper outer quadrant has increased in size and there is a new mass in the upper outer quadrant at the 2 o'clock position. 2. No significant change in the size of the biopsy-proven malignancy in the subareolar right breast, though the mass has less density which suggests treatment response. RECOMMENDATION: Treatment plan. I have discussed the findings and recommendations with the patient and her daughter. Results were also provided in writing at the conclusion of the visit. If applicable, a reminder letter will be sent to the patient regarding the next appointment. BI-RADS CATEGORY  6: Known biopsy-proven malignancy. Electronically Signed   By: Evangeline Dakin M.D.   On: 01/19/2018 17:13   Ir Fluoro Guide Port Insertion Right  Result Date: 12/22/2017 CLINICAL DATA:  Breast cancer. Needs durable venous access for chemotherapy regimen. EXAM: TUNNELED PORT CATHETER PLACEMENT WITH ULTRASOUND AND FLUOROSCOPIC GUIDANCE FLUOROSCOPY TIME:  3.2 seconds; 0.95 mGy ANESTHESIA/SEDATION: Intravenous Fentanyl and Versed were administered as conscious sedation during continuous monitoring of the patient's level of consciousness and physiological / cardiorespiratory status by the radiology RN, with a total moderate sedation time of 18 minutes. TECHNIQUE: The procedure, risks, benefits,  and alternatives were explained to the patient and daughter. Questions regarding the procedure were encouraged and answered. The daughter understands and consents to the procedure. As antibiotic prophylaxis, Cleocin 600 mg was ordered  pre-procedure and administered intravenously within one hour of incision. Patency of the right IJ vein was confirmed with ultrasound with image documentation. An appropriate skin site was determined. Skin site was marked. Region was prepped using maximum barrier technique including cap and mask, sterile gown, sterile gloves, large sterile sheet, and Chlorhexidine as cutaneous antisepsis. The region was infiltrated locally with 1% lidocaine. Under real-time ultrasound guidance, the right IJ vein was accessed with a 21 gauge micropuncture needle; the needle tip within the vein was confirmed with ultrasound image documentation. Needle was exchanged over a 018 guidewire for transitional dilator which allowed passage of the [facility] wire into the IVC. Over this, the transitional dilator was exchanged for a 5 [place] MPA catheter. A small incision was made on the right anterior chest wall and a subcutaneous pocket fashioned. The power-injectable port was positioned and its catheter tunneled to the right IJ dermatotomy site. The MPA catheter was exchanged over an Amplatz wire for a peel-away sheath, through which the port catheter, which had been trimmed to the appropriate length, was advanced and positioned under fluoroscopy with its tip at the cavoatrial junction. Spot chest radiograph confirms good catheter position and no pneumothorax. The pocket was closed with deep interrupted and subcuticular continuous 3-0 Monocryl sutures. The port was flushed per protocol. The incisions were covered with Dermabond then covered with a sterile dressing. COMPLICATIONS: COMPLICATIONS None immediate IMPRESSION: Technically successful right IJ power-injectable port catheter placement. Ready for routine use. Electronically Signed   By: Lucrezia Europe M.D.   On: 12/22/2017 14:10    ASSESSMENT: 84 y.o. [place] woman with bilateral breast cancer, as follows:  (1) status post right breastt overlapping sites skin biopsy 11/21/2014 for a  clinical T4 N1, stage IIIB invasive ductal carcinoma, estrogen and progesterone receptor positive, HER-2 amplified  (2) status post 2 separate left breast upper outer quadrant biopsies and one left axillary lymph node biopsy 12/04/2014, all positive for a clinical mT1c N1, stage IIA invasive lobular carcinoma, estrogen receptor positive, progesterone receptor variable, with MIB-1 between 10 and 40%, and no HER-2 amplification  (3) neoadjuvant anastrozole started 12/14/2014. Discontinued with progression by repeat left breast US May 2017  (4) definitive surgery to consist of bilateral mastectomies without formal axillary dissection: Postponed  METASTATIC DISEASE: March 2016 (note: R breast CA is HER-2 positive, L is negative) (5) PET scan 12/19/2014 shows in addition to the bilateral breast and bilateral axillary masses, a lesion at the T11 vertebral body, showing an irregular lucency on prior CT exam  (a) adjuvant radiationt to T10-T12: 04/11/2015 through 04/24/2015 to a total dose of 30 gray in 10 fractions  (6) initially postponed anti-HER-2 treatment depending on response to antiestrogen therapy alone.    (7) osteoporosis with T score of -2.6 on bone scan obtained 02/19/2015.  (a) zolendronate started 03/20/2015, repeated every 12 weeks   (8) fulvestrant started 03/04/16--discontinued January 2019 with progression  (a) patient refused adding palbociclib  (b) PET scan in 10/15/2016 showed mild increase in the right breast mass and increased size and activity of 3.2 cm of solid left lung nodule  (c) letrozole added 10/16/2016--discontinued January 2019  (d) PET scan obtained 02/10/2017 showing a significant decrease in SUV in the measurable disease  (e) PET scan 10/15/2017 showing increase in bilateral breast and right  axillary adenopathy; left lung lesion is stable  (9) Doppler ultrasound both lower extremities 11/17/2016 and repeat 11/29/2016 are  Negative  (10) started trastuzumab  11/12/2017, repeated every 2 weeks  (11) started capecitabine 11/15/2017, to be received 1 week on 1 week off  (a) initial dose will be 1000 mg twice daily  (b) dose reduced on 11/29/2017 to 1000 mg daily due to questionable tolerance issues  (c) capecitabine discontinued 12/10/2017 with intolerance (diarrhea)  (12) exemestane ordered 12/10/2017.  To add everolimus 12/24/2017 at 2.5 mg daily  PLAN: Lauren Buchanan is tolerating her current treatment remarkably well and it is working on the right side.  However the left side shows progression.  This is a problem because the patient while having metastatic disease is not in any sense terminal.  Quite aside from her advanced age, she could easily live 1-5 more years.  Ensured there is a real possibility that the left breast cancer will become locally uncontrolled and torture her for the rest of her life.  We discussed aggressive chemotherapy, radiation, and surgery.  For the first time Lauren Buchanan is agreeable to considering mastectomy on the left (our original plan was for bilateral mastectomies) and I strongly recommended that.  It is going to give [place] the best chance of local control.  If she recovers well from that we can follow it up with radiation afterwards.  Of course if she decides on bilateral mastectomies that would also be a good possibility.  I am leaving the final surgical decisions to Dr. Dalbert Batman and the patient.  Hopefully we can move on the quicker rather than the slower side as far as that is concerned  She will return in 2 weeks for her next treatment in 3 weeks to see me again  She knows to call for any other issues that may develop before the next visit.   [name], [name], MD  01/21/18 10:02 AM Medical Oncology and Hematology [facility] [address] [place], [place] [address] Tel. [phone]    Fax. [phone]  This document serves as a record of services personally performed by Chauncey Cruel, MD. It was  created on his behalf by Margit Banda, a trained medical scribe. The creation of this record is based on the scribe's personal observations and the provider's statements to them.   I have reviewed the above documentation for accuracy and completeness, and I agree with the above.

## 2018-01-21 ENCOUNTER — Inpatient Hospital Stay: Payer: Medicare Other

## 2018-01-21 ENCOUNTER — Inpatient Hospital Stay (HOSPITAL_BASED_OUTPATIENT_CLINIC_OR_DEPARTMENT_OTHER): Payer: Medicare Other | Admitting: Oncology

## 2018-01-21 ENCOUNTER — Telehealth: Payer: Self-pay | Admitting: Pharmacist

## 2018-01-21 ENCOUNTER — Inpatient Hospital Stay: Payer: Medicare Other | Attending: Oncology

## 2018-01-21 VITALS — BP 162/69 | HR 106 | Temp 98.0°F | Resp 18 | Ht 60.0 in | Wt 125.7 lb

## 2018-01-21 DIAGNOSIS — C7951 Secondary malignant neoplasm of bone: Secondary | ICD-10-CM

## 2018-01-21 DIAGNOSIS — C773 Secondary and unspecified malignant neoplasm of axilla and upper limb lymph nodes: Secondary | ICD-10-CM | POA: Diagnosis not present

## 2018-01-21 DIAGNOSIS — Z950 Presence of cardiac pacemaker: Secondary | ICD-10-CM

## 2018-01-21 DIAGNOSIS — Z170 Estrogen receptor positive status [ER+]: Secondary | ICD-10-CM | POA: Insufficient documentation

## 2018-01-21 DIAGNOSIS — Z800 Family history of malignant neoplasm of digestive organs: Secondary | ICD-10-CM

## 2018-01-21 DIAGNOSIS — C3492 Malignant neoplasm of unspecified part of left bronchus or lung: Secondary | ICD-10-CM

## 2018-01-21 DIAGNOSIS — Z79899 Other long term (current) drug therapy: Secondary | ICD-10-CM

## 2018-01-21 DIAGNOSIS — Z923 Personal history of irradiation: Secondary | ICD-10-CM | POA: Diagnosis not present

## 2018-01-21 DIAGNOSIS — C50412 Malignant neoplasm of upper-outer quadrant of left female breast: Secondary | ICD-10-CM | POA: Diagnosis not present

## 2018-01-21 DIAGNOSIS — Z803 Family history of malignant neoplasm of breast: Secondary | ICD-10-CM

## 2018-01-21 DIAGNOSIS — C50811 Malignant neoplasm of overlapping sites of right female breast: Secondary | ICD-10-CM

## 2018-01-21 DIAGNOSIS — M810 Age-related osteoporosis without current pathological fracture: Secondary | ICD-10-CM | POA: Insufficient documentation

## 2018-01-21 DIAGNOSIS — J449 Chronic obstructive pulmonary disease, unspecified: Secondary | ICD-10-CM | POA: Insufficient documentation

## 2018-01-21 DIAGNOSIS — I480 Paroxysmal atrial fibrillation: Secondary | ICD-10-CM | POA: Diagnosis not present

## 2018-01-21 DIAGNOSIS — Z66 Do not resuscitate: Secondary | ICD-10-CM

## 2018-01-21 DIAGNOSIS — K219 Gastro-esophageal reflux disease without esophagitis: Secondary | ICD-10-CM | POA: Diagnosis not present

## 2018-01-21 DIAGNOSIS — C50919 Malignant neoplasm of unspecified site of unspecified female breast: Secondary | ICD-10-CM

## 2018-01-21 DIAGNOSIS — Z5112 Encounter for antineoplastic immunotherapy: Secondary | ICD-10-CM | POA: Diagnosis not present

## 2018-01-21 MED ORDER — SODIUM CHLORIDE 0.9% FLUSH
10.0000 mL | INTRAVENOUS | Status: DC | PRN
  Administered 2018-01-21: 10 mL
  Filled 2018-01-21: qty 10

## 2018-01-21 MED ORDER — SODIUM CHLORIDE 0.9 % IV SOLN
Freq: Once | INTRAVENOUS | Status: AC
  Administered 2018-01-21: 12:00:00 via INTRAVENOUS

## 2018-01-21 MED ORDER — DIPHENHYDRAMINE HCL 25 MG PO CAPS
25.0000 mg | ORAL_CAPSULE | Freq: Once | ORAL | Status: AC
  Administered 2018-01-21: 25 mg via ORAL

## 2018-01-21 MED ORDER — HEPARIN SOD (PORK) LOCK FLUSH 100 UNIT/ML IV SOLN
500.0000 [IU] | Freq: Once | INTRAVENOUS | Status: AC | PRN
  Administered 2018-01-21: 500 [IU]
  Filled 2018-01-21: qty 5

## 2018-01-21 MED ORDER — ACETAMINOPHEN 325 MG PO TABS
ORAL_TABLET | ORAL | Status: AC
  Filled 2018-01-21: qty 2

## 2018-01-21 MED ORDER — DIPHENHYDRAMINE HCL 25 MG PO CAPS
ORAL_CAPSULE | ORAL | Status: AC
  Filled 2018-01-21: qty 1

## 2018-01-21 MED ORDER — TRASTUZUMAB CHEMO 150 MG IV SOLR
4.0000 mg/kg | Freq: Once | INTRAVENOUS | Status: AC
  Administered 2018-01-21: 231 mg via INTRAVENOUS
  Filled 2018-01-21: qty 11

## 2018-01-21 MED ORDER — ACETAMINOPHEN 325 MG PO TABS
650.0000 mg | ORAL_TABLET | Freq: Once | ORAL | Status: AC
  Administered 2018-01-21: 650 mg via ORAL

## 2018-01-21 NOTE — Patient Instructions (Signed)
[facility] Discharge Instructions for Patients Receiving Chemotherapy  Today you received the following chemotherapy agents Herceptin  To help prevent nausea and vomiting after your treatment, we encourage you to take your nausea medication as directed   If you develop nausea and vomiting that is not controlled by your nausea medication, call the clinic.   BELOW ARE SYMPTOMS THAT SHOULD BE REPORTED IMMEDIATELY:  *FEVER GREATER THAN 100.5 F  *CHILLS WITH OR WITHOUT FEVER  NAUSEA AND VOMITING THAT IS NOT CONTROLLED WITH YOUR NAUSEA MEDICATION  *UNUSUAL SHORTNESS OF BREATH  *UNUSUAL BRUISING OR BLEEDING  TENDERNESS IN MOUTH AND THROAT WITH OR WITHOUT PRESENCE OF ULCERS  *URINARY PROBLEMS  *BOWEL PROBLEMS  UNUSUAL RASH Items with * indicate a potential emergency and should be followed up as soon as possible.  Feel free to call the clinic should you have any questions or concerns. The clinic phone number is [phone].  Please show the CHEMO ALERT CARD at check-in to the Emergency Department and triage nurse.   

## 2018-01-21 NOTE — Progress Notes (Signed)
NO labs needed today for herceptin tx per MD [name]

## 2018-01-21 NOTE — Telephone Encounter (Addendum)
Oral Chemotherapy Pharmacist Encounter  Dispensed samples to patient:  Medication: Afinitor 5 mg tablets Instructions:  Take 1/2 tablet (2.5 mg) by mouth once daily, without regard to food Quantity dispensed: 7 Days supply: 14 Manufacturer: [employer] Lot: [id number] Exp: January 2020  Samples given to daughter.  Patient also approved for [employer] patient assistance to receive the Afinitor at no out-of-pocket cost to the patient. Approval letter copy given to daughter.  We anticipate for shipment of Afinitor will be delivered to patient in approximately 10-14 days. Daughter, [name], knows to call the office if it looks like Rea will run out of pills prior to her first shipment from patient assistance program.  Patient has been tolerating the Afinitor 2.5 mg daily without issue.  Patient denies any GI upset, tiredness, mouth sores, or any other adverse adverse effects from treatment with Afinitor.  Patient and daughter know to call the office with any additional questions or concerns.  We will follow up with them about shipment from [employer].  Oral oncology clinic will continue to follow.  Johny Drilling, PharmD, BCPS, BCOP 01/21/2018 10:27 AM Oral Oncology Clinic [phone]

## 2018-01-24 ENCOUNTER — Telehealth: Payer: Self-pay | Admitting: Oncology

## 2018-01-24 ENCOUNTER — Other Ambulatory Visit: Payer: Self-pay | Admitting: General Surgery

## 2018-01-24 DIAGNOSIS — C50912 Malignant neoplasm of unspecified site of left female breast: Secondary | ICD-10-CM | POA: Diagnosis not present

## 2018-01-24 DIAGNOSIS — R413 Other amnesia: Secondary | ICD-10-CM | POA: Diagnosis not present

## 2018-01-24 DIAGNOSIS — Z803 Family history of malignant neoplasm of breast: Secondary | ICD-10-CM | POA: Diagnosis not present

## 2018-01-24 DIAGNOSIS — Z902 Acquired absence of lung [part of]: Secondary | ICD-10-CM | POA: Diagnosis not present

## 2018-01-24 DIAGNOSIS — M316 Other giant cell arteritis: Secondary | ICD-10-CM | POA: Diagnosis not present

## 2018-01-24 DIAGNOSIS — J479 Bronchiectasis, uncomplicated: Secondary | ICD-10-CM | POA: Diagnosis not present

## 2018-01-24 DIAGNOSIS — I442 Atrioventricular block, complete: Secondary | ICD-10-CM | POA: Diagnosis not present

## 2018-01-24 DIAGNOSIS — Z950 Presence of cardiac pacemaker: Secondary | ICD-10-CM | POA: Diagnosis not present

## 2018-01-24 DIAGNOSIS — C50911 Malignant neoplasm of unspecified site of right female breast: Secondary | ICD-10-CM | POA: Diagnosis not present

## 2018-01-24 NOTE — Telephone Encounter (Signed)
Oral Oncology Patient Advocate Encounter  Received notification from [employer] Patient Assistance program that patient has been successfully enrolled into their program to receive Afinitor from the manufacturer at $0 out of pocket until 10/18/2018.  Denyse Amass informed the patient's daughter, [name], and provided her with a copy of the approval letter.  Ms. Olthoff and [name] know to call the office with any additional questions or concerns.  Oral Oncology Clinic will continue to follow.  Gilmore Laroche, CPhT, [employer] Oral Oncology Patient Advocate [phone] 01/24/2018 10:57 AM

## 2018-01-24 NOTE — Telephone Encounter (Signed)
Per 4/5 los sent a letter

## 2018-01-25 ENCOUNTER — Telehealth: Payer: Self-pay | Admitting: Internal Medicine

## 2018-01-25 NOTE — Telephone Encounter (Signed)
   Primary Cardiologist:No primary care provider on file.  Chart reviewed as part of pre-operative protocol coverage. Because of Lauren Buchanan's past medical history and time since last visit, he/she will require a follow-up visit in order to better assess preoperative cardiovascular risk.  Last seen on 12/16/16. Needs her device checked, look for afib and arrhythmias.   In chart review it looks like her procedure is scheduled for 02/08/18.  Pre-op covering staff: - Please schedule appointment and call patient to inform them. - Please contact requesting surgeon's office via preferred method (i.e, phone, fax) to inform them of need for appointment prior to surgery.  [name], NP  01/25/2018, 4:35 PM

## 2018-01-25 NOTE — Telephone Encounter (Signed)
New Message       [facility] Pre-operative Risk Assessment    Request for surgical clearance:  1. What type of surgery is being performed? Left total mascetomy Urgently   2. When is this surgery scheduled? 02/03/2018   3. What type of clearance is required (medical clearance vs. Pharmacy clearance to hold med vs. Both)? Medical Clearance   4. Are there any medications that need to be held prior to surgery and how long?    5. Practice name and name of physician performing surgery? [facility] Dr. Dalbert Batman   6. What is your office phone and fax number?  Office [phone] Fax [phone]  7. Anesthesia type (None, local, MAC, general) ? General    Avaletta L Williams 01/25/2018, 9:20 AM  _________________________________________________________________   (provider comments below)

## 2018-01-25 NOTE — Telephone Encounter (Signed)
Patient has appointment with Dr. Haroldine Laws on Monday April 15 for evaluation prior to starting treatment for breast cancer. I spoke with Kevan Rosebush, RN, Dr. Clayborne Dana nurse who agrees that preop clearance can be completed at that visit. She is aware that device interrogation will likely be needed. I notified patient's daughter, [name], who verbalized understanding of appointment time for echo and Dr. Haroldine Laws at [facility]. She thanked me for my help.

## 2018-01-31 ENCOUNTER — Other Ambulatory Visit: Payer: Self-pay

## 2018-01-31 ENCOUNTER — Ambulatory Visit (HOSPITAL_COMMUNITY)
Admission: RE | Admit: 2018-01-31 | Discharge: 2018-01-31 | Disposition: A | Discharge status: Home / Self Care | Payer: Medicare Other | Source: Ambulatory Visit | Attending: Internal Medicine | Admitting: Internal Medicine

## 2018-01-31 ENCOUNTER — Encounter (HOSPITAL_COMMUNITY): Payer: Self-pay | Admitting: Internal Medicine

## 2018-01-31 ENCOUNTER — Ambulatory Visit (HOSPITAL_BASED_OUTPATIENT_CLINIC_OR_DEPARTMENT_OTHER)
Admission: RE | Admit: 2018-01-31 | Discharge: 2018-01-31 | Disposition: A | Discharge status: Home / Self Care | Payer: Medicare Other | Source: Ambulatory Visit | Attending: Internal Medicine | Admitting: Internal Medicine

## 2018-01-31 VITALS — BP 160/70 | HR 80 | Wt 125.0 lb

## 2018-01-31 DIAGNOSIS — I10 Essential (primary) hypertension: Secondary | ICD-10-CM | POA: Diagnosis not present

## 2018-01-31 DIAGNOSIS — K219 Gastro-esophageal reflux disease without esophagitis: Secondary | ICD-10-CM | POA: Insufficient documentation

## 2018-01-31 DIAGNOSIS — Z85118 Personal history of other malignant neoplasm of bronchus and lung: Secondary | ICD-10-CM | POA: Diagnosis not present

## 2018-01-31 DIAGNOSIS — Z170 Estrogen receptor positive status [ER+]: Secondary | ICD-10-CM | POA: Diagnosis not present

## 2018-01-31 DIAGNOSIS — J449 Chronic obstructive pulmonary disease, unspecified: Secondary | ICD-10-CM | POA: Diagnosis not present

## 2018-01-31 DIAGNOSIS — I082 Rheumatic disorders of both aortic and tricuspid valves: Secondary | ICD-10-CM | POA: Insufficient documentation

## 2018-01-31 DIAGNOSIS — I480 Paroxysmal atrial fibrillation: Secondary | ICD-10-CM | POA: Diagnosis not present

## 2018-01-31 DIAGNOSIS — I442 Atrioventricular block, complete: Secondary | ICD-10-CM

## 2018-01-31 DIAGNOSIS — I4891 Unspecified atrial fibrillation: Secondary | ICD-10-CM | POA: Diagnosis not present

## 2018-01-31 DIAGNOSIS — C50412 Malignant neoplasm of upper-outer quadrant of left female breast: Secondary | ICD-10-CM | POA: Insufficient documentation

## 2018-01-31 DIAGNOSIS — M1990 Unspecified osteoarthritis, unspecified site: Secondary | ICD-10-CM | POA: Insufficient documentation

## 2018-01-31 DIAGNOSIS — Z79899 Other long term (current) drug therapy: Secondary | ICD-10-CM | POA: Diagnosis not present

## 2018-01-31 DIAGNOSIS — Z950 Presence of cardiac pacemaker: Secondary | ICD-10-CM | POA: Insufficient documentation

## 2018-01-31 NOTE — Progress Notes (Signed)
CARDIO-ONCOLOGY CLINIC CONSULT NOTE  Referring Physician: Primary Care: Primary Cardiologist:  HPI:  Ms Lauren Buchanan") is 84 y.o. female with metastatic breast cancer referred by Dr. Doris Cheadle  for enrollment into the Cardio-Oncology program.  She has multiple medical problems including CHB and is s/p PPM in 1999 by Dr. Olevia Perches for CHB. Now followed by Dr. Rayann Heman.   He cancer history is complicated and outlined below:  (1) status post right breastt overlapping sites skin biopsy 11/21/2014 for a clinical T4 N1, stage IIIB invasive ductal carcinoma, estrogen and progesterone receptor positive, HER-2 amplified  (2) status post 2 separate left breast upper outer quadrant biopsies and one left axillary lymph node biopsy 12/04/2014, all positive for a clinical mT1c N1, stage IIA invasive lobular carcinoma, estrogen receptor positive, progesterone receptor variable, with MIB-1 between 10 and 40%, and no HER-2 amplification. METASTATIC DISEASE: March 2016 (note: R breast CA is HER-2 positive, L is negative)  (3) neoadjuvant anastrozole started 12/14/2014. Discontinued with progression by repeat left breast US May 2017  (4) definitive surgery to consist of bilateral mastectomies without formal axillary dissection: Postponed  (5) PET scan 12/19/2014 shows in addition to the bilateral breast and bilateral axillary masses, a lesion at the T11 vertebral body, showing an irregular lucency on prior CT exam             (a) adjuvant radiationt to T10-T12: 04/11/2015 through 04/24/2015 to a total dose of 30 gray in 10 fractions  (6) initially postponed anti-HER-2 treatment depending on response to antiestrogen therapy alone.    (7) fulvestrant started 03/04/16--discontinued January 2019 with progression             (a) patient refused adding palbociclib             (b) PET scan in 10/15/2016 showed mild increase in the right breast mass and increased size and activity of 3.2 cm of solid left lung  nodule             (c) letrozole added 10/16/2016--discontinued January 2019             (d) PET scan obtained 02/10/2017 showing a significant decrease in SUV in the measurable disease             (e) PET scan 10/15/2017 showing increase in bilateral breast and right axillary adenopathy; left lung lesion is stable  (8) started trastuzumab 11/12/2017, repeated every 2 weeks  (9) started capecitabine 11/15/2017, to be received 1 week on 1 week off             (a) initial dose will be 1000 mg twice daily             (b) dose reduced on 11/29/2017 to 1000 mg daily due to questionable tolerance issues             (c) capecitabine discontinued 12/10/2017 with intolerance (diarrhea)  (10) exemestane ordered 12/10/2017.  To add everolimus 12/24/2017 at 2.5 mg daily   Lives at [facility]. Says she is doing quite well. Denies any problems with her chemo or her Herceptin. Denies dyspnea, orthopnea or PND. No edema. No CP. She is pending mastectomy with Dr. Dalbert Batman next week and would like her pacer checked.   ECHO  11/02/2017 55-60%.Mild AS ECHO: 60-5% mild AS LS' 10.8 GLS -15.8% Reviewed personally.   Review of Systems: [y] = yes, '[ ]'$  = no   General: Weight gain '[ ]'$ ; Weight loss '[ ]'$ ; Anorexia '[ ]'$ ; Fatigue '[ ]'$ ;  Fever '[ ]'$ ; Chills '[ ]'$ ; Weakness '[ ]'$   Cardiac: Chest pain/pressure '[ ]'$ ; Resting SOB '[ ]'$ ; Exertional SOB '[ ]'$ ; Orthopnea '[ ]'$ ; Pedal Edema '[ ]'$ ; Palpitations '[ ]'$ ; Syncope '[ ]'$ ; Presyncope '[ ]'$ ; Paroxysmal nocturnal dyspnea'[ ]'$   Pulmonary: Cough '[ ]'$ ; Wheezing'[ ]'$ ; Hemoptysis'[ ]'$ ; Sputum '[ ]'$ ; Snoring '[ ]'$   GI: Vomiting'[ ]'$ ; Dysphagia'[ ]'$ ; Melena'[ ]'$ ; Hematochezia '[ ]'$ ; Heartburn'[ ]'$ ; Abdominal pain '[ ]'$ ; Constipation '[ ]'$ ; Diarrhea '[ ]'$ ; BRBPR '[ ]'$   GU: Hematuria'[ ]'$ ; Dysuria '[ ]'$ ; Nocturia'[ ]'$   Vascular: Pain in legs with walking '[ ]'$ ; Pain in feet with lying flat '[ ]'$ ; Non-healing sores [ y]; Stroke '[ ]'$ ; TIA '[ ]'$ ; Slurred speech '[ ]'$ ;  Neuro: Headaches'[ ]'$ ; Vertigo'[ ]'$ ; Seizures'[ ]'$ ; Paresthesias'[ ]'$ ;Blurred vision [  ]; Diplopia '[ ]'$ ; Vision changes '[ ]'$   Ortho/Skin: Arthritis Blue.Reese ]; Joint pain Blue.Reese ]; Muscle pain '[ ]'$ ; Joint swelling '[ ]'$ ; Back Pain '[ ]'$ ; Rash '[ ]'$   Psych: Depression'[ ]'$ ; Anxiety'[ ]'$   Heme: Bleeding problems '[ ]'$ ; Clotting disorders '[ ]'$ ; Anemia '[ ]'$   Endocrine: Diabetes '[ ]'$ ; Thyroid dysfunction'[ ]'$    Past Medical History:  Diagnosis Date  . Asthma   . Breast cancer ([place]) 12/08/2014   Bilateral  . Cancer of central portion of female breast ([place]) 12/04/2014  . Cancer of central portion of female breast ([place]) 12/04/2014  . Complete heart block ([place]) 1999   s/p PPM by Dr Olevia Perches, most recent generator change 2009  . COPD (chronic obstructive pulmonary disease) ([place])   . Diverticulosis   . DJD (degenerative joint disease)   . Esophageal stricture   . GERD (gastroesophageal reflux disease)   . Giant cell arteritis ([place])   . Goiter   . Hemorrhoids   . Hiatal hernia   . IBS (irritable bowel syndrome)   . Lung cancer ([place])   . Paroxysmal atrial fibrillation ([place]) 12/17/2015   asymptomatic, <1%, determined on regular pacemaker interrogation  . Pneumonia   . Renal cyst   . S/P radiation therapy 04/24/15 completed   T-11  . Wears glasses     Current Outpatient Medications  Medication Sig Dispense Refill  . Calcium Carb-Cholecalciferol (CALTRATE 600+D3 PO) Take 1 tablet by mouth daily.    . diclofenac sodium (VOLTAREN) 1 % GEL Apply 2 g topically 3 (three) times daily as needed (APPLY TO SHOULDER AND LEFT KNEE  FOR PAIN).     Marland Kitchen everolimus (AFINITOR) 2.5 MG tablet Take 1 tablet (2.5 mg total) by mouth daily. Caution:chemotherapy. 30 tablet 12  . exemestane (AROMASIN) 25 MG tablet Take 1 tablet (25 mg total) by mouth daily after breakfast. 90 tablet 4  . lactose free nutrition (BOOST) LIQD Take 237 mLs daily by mouth.    . metoprolol succinate (TOPROL-XL) 50 MG 24 hr tablet Take 50 mg by mouth daily. Take with or immediately following a meal.    . predniSONE (DELTASONE) 5 MG tablet Take 5 mg by mouth  daily with breakfast. Reported on 11/19/2015    . traMADol (ULTRAM) 50 MG tablet Take 50 mg by mouth every 6 (six) hours as needed (FOR PAIN).     . Turmeric 500 MG TABS Take 500 mg by mouth daily.     No current facility-administered medications for this encounter.     Allergies  Allergen Reactions  . Morphine And Related Nausea Only  . Amoxicillin-Pot Clavulanate Other (See Comments)    Upset stomach Has patient had a PCN reaction  causing immediate rash, facial/tongue/throat swelling, SOB or lightheadedness with hypotension: No Has patient had a PCN reaction causing severe rash involving mucus membranes or skin necrosis: No Has patient had a PCN reaction that required hospitalization: No Has patient had a PCN reaction occurring within the last 10 years: No If all of the above answers are "NO", then may proceed with Cephalosporin use.       Social History   Socioeconomic History  . Marital status: Married    Spouse name: Not on file  . Number of children: 4  . Years of education: Not on file  . Highest education level: Not on file  Occupational History  . Occupation: Retired  Scientific laboratory technician  . Financial resource strain: Not on file  . Food insecurity:    Worry: Not on file    Inability: Not on file  . Transportation needs:    Medical: Not on file    Non-medical: Not on file  Tobacco Use  . Smoking status: Never Smoker  . Smokeless tobacco: Never Used  Substance and Sexual Activity  . Alcohol use: Yes    Alcohol/week: 0.0 oz    Comment: Occasional   . Drug use: No  . Sexual activity: Not Currently  Lifestyle  . Physical activity:    Days per week: Not on file    Minutes per session: Not on file  . Stress: Not on file  Relationships  . Social connections:    Talks on phone: Not on file    Gets together: Not on file    Attends religious service: Not on file    Active member of club or organization: Not on file    Attends meetings of clubs or organizations: Not on  file    Relationship status: Not on file  . Intimate partner violence:    Fear of current or ex partner: Not on file    Emotionally abused: Not on file    Physically abused: Not on file    Forced sexual activity: Not on file  Other Topics Concern  . Not on file  Social History Narrative   Social History     Marital status: Widowed          Spouse name:                        Years of education:                 Number of children: 4           Occupational History   Occupation: Copywriter, advertising                Retired : Yes                                      Social History Main Topics     Smoking status: Never Smoker                                                                  Smokeless  tobacco: Never Used                         Alcohol use:         0.0 oz/week        Comment:      Drug use: No               Sexual activity:        Other Topics            Concern     None on file      Social History Narrative     Do you drink/eat things with caffeine?    Yes     Marital status: Widowed.  What year were you married? 1948     Do you have a living will? Yes     Do you have DNR form? Yes     Do you have signed POA/HPOA forms? Yes               Family History  Problem Relation Age of Onset  . Heart disease Mother   . Breast cancer Mother   . Stomach cancer Father   . Alcohol abuse Father   . Seizures Son   . Thyroid disease Sister     Vitals:   01/31/18 1356  BP: (!) 160/70  Pulse: 80  SpO2: 96%  Weight: 125 lb (56.7 kg)    PHYSICAL EXAM: General:  Elderly. No respiratory difficulty HEENT: normal Neck: supple. JVP 6-7. Carotids 2+ bilat; + bruits. No lymphadenopathy or thryomegaly appreciated. Cor: PMI nondisplaced. Regular rate & rhythm. 2/6 AS. RIJ port L-sided PPM  Lungs: clear Abdomen: soft, nontender, nondistended. No hepatosplenomegaly. No bruits or masses. Good bowel sounds. Extremities: no cyanosis, clubbing, rash,  edema Neuro: alert & oriented x 3, cranial nerves grossly intact. moves all 4 extremities w/o difficulty. Affect pleasant.   ASSESSMENT & PLAN: 1. Breast Cancer, metastatic -Explained incidence of Herceptin cardiotoxicity and role of Cardio-oncology clinic at length. Echo images reviewed personally. All parameters stable. Reviewed signs and symptoms of HF to look for. Continue Herceptin. Follow-up with echo in 3 months.  2. CHB s/p PPM - Pacer interrogated personally with [facility] rep. She is nearing EOL (but not there yet). She is pacer dependent. Pacer will need to be reprogrammed at time of surgery (probably to DOO). Device rep will arrange. Will arrnge f.u in [facility].   3. HTN - BP elevated here. Will follow. If remains elevated after surgery can treat gently given age  Glori Bickers, MD  2:33 PM

## 2018-01-31 NOTE — Patient Instructions (Signed)
Your physician has requested that you have an echocardiogram. Echocardiography is a painless test that uses sound waves to create images of your heart. It provides your doctor with information about the size and shape of your heart and how well your heart's chambers and valves are working. This procedure takes approximately one hour. There are no restrictions for this procedure.  Your physician recommends that you schedule a follow-up appointment in: 3 months with Dr. Haroldine Laws  an a echocardiogram  Please Call an Schedule Appointment

## 2018-01-31 NOTE — Progress Notes (Signed)
  Echocardiogram 2D Echocardiogram has been performed.  Lauren Buchanan 01/31/2018, 2:05 PM

## 2018-02-01 NOTE — Pre-Procedure Instructions (Addendum)
Lauren Buchanan  02/01/2018       Your procedure is scheduled on Tuesday, April 23.  Report to [facility] Admitting at 9:15 AM                       Your surgery or procedure is scheduled for 11:15 A.M.   Call this number if you have problems the morning of surgery: [phone]- this is the pre- op desk.  Remember:  Do not eat food or drink liquids after midnight Monday, April 22. Except drink Ensure Pre- Surgery just before leaving your home.   Take these medicines the morning of surgery with A SIP OF WATER :  metoprolol succinate (TOPROL-XL) predniSONE (DELTASONE)  May take if needed: traMADol Lauren Buchanan)  STOP taking Aspirin, Aspirin Products (Goody Powder, Excedrin Migraine), Ibuprofen (Advil), Naproxen (Aleve), Vitamins and Herbal Products (ie Fish Oil,Turmeric ).  Special instructions:   [place]- Preparing For Surgery  Before surgery, you can play an important role. Because skin is not sterile, your skin needs to be as free of germs as possible. You can reduce the number of germs on your skin by washing with CHG (chlorahexidine gluconate) Soap before surgery.  CHG is an antiseptic cleaner which kills germs and bonds with the skin to continue killing germs even after washing.  Please do not use if you have an allergy to CHG or antibacterial soaps. If your skin becomes reddened/irritated stop using the CHG.  Do not shave (including legs and underarms) for at least 48 hours prior to first CHG shower. It is OK to shave your face.  Please follow these instructions carefully.   1. Shower the NIGHT BEFORE SURGERY and the MORNING OF SURGERY with CHG.   2. If you chose to wash your hair, wash your hair first as usual with your normal shampoo.  3. After you shampoo, rinse your hair and body thoroughly to remove the shampoo.    4.   Wash Face and genitals (private parts)  with your normal soap. Then rinse.   5. Use CHG as you would any other liquid soap. You can  apply CHG directly to the skin and wash gently with a scrungie or a clean washcloth.   6. Apply the CHG Soap to your body ONLY FROM THE NECK DOWN.  Do not use on open wounds or open sores. Avoid contact with your eyes, ears, mouth and genitals (private parts). Wash Face and genitals (private parts)  with your normal soap.  7. Wash thoroughly, paying special attention to the area where your surgery will be performed.  8. Thoroughly rinse your body with warm water from the neck down.  9. DO NOT shower/wash with your normal soap after using and rinsing off the CHG Soap.  10. Pat yourself dry with a CLEAN TOWEL.  11. Wear CLEAN PAJAMAS to bed the night before surgery, wear comfortable clothes the morning of surgery  12. Place CLEAN SHEETS on your bed the night of your first shower and DO NOT SLEEP WITH PETS.  Day of Surgery: Shower as above. Do not apply any deodorants/lotions, powders or cologne. Please wear clean clothes to the hospital/surgery center.   Do not wear jewelry, make-up or nail polish.  Do not shave 48 hours prior to surgery.  Men may shave face and neck.  Do not bring valuables to the hospital.  [facility] is not responsible for any belongings or valuables.  Contacts, dentures or  bridgework may not be worn into surgery.  Leave your suitcase in the car.  After surgery it may be brought to your room.  For patients admitted to the hospital, discharge time will be determined by your treatment team.  Patients discharged the day of surgery will not be allowed to drive home.   Please read over the following fact sheets that you were given:  Pain Booklet, Coughing and Deep Breathing, Surgical Site Infections.

## 2018-02-02 ENCOUNTER — Telehealth: Payer: Self-pay | Admitting: Adult Health

## 2018-02-02 NOTE — Telephone Encounter (Signed)
[name] called and informed me that her mom, Dot, is undergoing surgery on 4/23 with Dr. Dalbert Batman.  She is due for Herceptin on 4/19.  Her daughter is concerned about her receiving herceptin prior to surgery since it tires her out.  I reviewed this with Dr. Jana Hakim and we will cancel her 4/19 and 4/26 appointments.  She will see [place] on 02/18/18.  Wilber Bihari, NP

## 2018-02-03 ENCOUNTER — Other Ambulatory Visit: Payer: Self-pay

## 2018-02-03 ENCOUNTER — Other Ambulatory Visit (HOSPITAL_COMMUNITY): Payer: Medicare Other

## 2018-02-03 ENCOUNTER — Encounter (HOSPITAL_COMMUNITY)
Admission: RE | Admit: 2018-02-03 | Discharge: 2018-02-03 | Disposition: A | Discharge status: Home / Self Care | Payer: Medicare Other | Source: Ambulatory Visit | Attending: General Surgery | Admitting: General Surgery

## 2018-02-03 ENCOUNTER — Encounter (HOSPITAL_COMMUNITY): Payer: Self-pay

## 2018-02-03 DIAGNOSIS — Z79899 Other long term (current) drug therapy: Secondary | ICD-10-CM | POA: Insufficient documentation

## 2018-02-03 DIAGNOSIS — I442 Atrioventricular block, complete: Secondary | ICD-10-CM | POA: Diagnosis not present

## 2018-02-03 DIAGNOSIS — J449 Chronic obstructive pulmonary disease, unspecified: Secondary | ICD-10-CM | POA: Insufficient documentation

## 2018-02-03 DIAGNOSIS — R9431 Abnormal electrocardiogram [ECG] [EKG]: Secondary | ICD-10-CM | POA: Diagnosis not present

## 2018-02-03 DIAGNOSIS — K589 Irritable bowel syndrome without diarrhea: Secondary | ICD-10-CM | POA: Diagnosis not present

## 2018-02-03 DIAGNOSIS — K219 Gastro-esophageal reflux disease without esophagitis: Secondary | ICD-10-CM | POA: Diagnosis not present

## 2018-02-03 DIAGNOSIS — Z01810 Encounter for preprocedural cardiovascular examination: Secondary | ICD-10-CM | POA: Insufficient documentation

## 2018-02-03 DIAGNOSIS — Z9049 Acquired absence of other specified parts of digestive tract: Secondary | ICD-10-CM | POA: Diagnosis not present

## 2018-02-03 DIAGNOSIS — Z9889 Other specified postprocedural states: Secondary | ICD-10-CM | POA: Insufficient documentation

## 2018-02-03 DIAGNOSIS — Z01812 Encounter for preprocedural laboratory examination: Secondary | ICD-10-CM | POA: Diagnosis not present

## 2018-02-03 DIAGNOSIS — F0390 Unspecified dementia without behavioral disturbance: Secondary | ICD-10-CM | POA: Diagnosis not present

## 2018-02-03 DIAGNOSIS — Z853 Personal history of malignant neoplasm of breast: Secondary | ICD-10-CM | POA: Insufficient documentation

## 2018-02-03 DIAGNOSIS — M1990 Unspecified osteoarthritis, unspecified site: Secondary | ICD-10-CM | POA: Diagnosis not present

## 2018-02-03 DIAGNOSIS — Z950 Presence of cardiac pacemaker: Secondary | ICD-10-CM | POA: Insufficient documentation

## 2018-02-03 DIAGNOSIS — I480 Paroxysmal atrial fibrillation: Secondary | ICD-10-CM | POA: Insufficient documentation

## 2018-02-03 HISTORY — DX: Adverse effect of unspecified anesthetic, initial encounter: T41.45XA

## 2018-02-03 HISTORY — DX: Other specified postprocedural states: Z98.890

## 2018-02-03 HISTORY — DX: Other complications of anesthesia, initial encounter: T88.59XA

## 2018-02-03 HISTORY — DX: Nausea with vomiting, unspecified: R11.2

## 2018-02-03 HISTORY — DX: Unspecified dementia, unspecified severity, without behavioral disturbance, psychotic disturbance, mood disturbance, and anxiety: F03.90

## 2018-02-03 HISTORY — DX: Family history of other specified conditions: Z84.89

## 2018-02-03 LAB — CBC WITH DIFFERENTIAL/PLATELET
Basophils Absolute: 0 10*3/uL (ref 0.0–0.1)
Basophils Relative: 0 %
Eosinophils Absolute: 0.1 10*3/uL (ref 0.0–0.7)
Eosinophils Relative: 1 %
HEMATOCRIT: 39.5 % (ref 36.0–46.0)
HEMOGLOBIN: 12.6 g/dL (ref 12.0–15.0)
LYMPHS ABS: 0.5 10*3/uL — AB (ref 0.7–4.0)
Lymphocytes Relative: 7 %
MCH: 28.5 pg (ref 26.0–34.0)
MCHC: 31.9 g/dL (ref 30.0–36.0)
MCV: 89.4 fL (ref 78.0–100.0)
MONOS PCT: 7 %
Monocytes Absolute: 0.5 10*3/uL (ref 0.1–1.0)
NEUTROS ABS: 6 10*3/uL (ref 1.7–7.7)
NEUTROS PCT: 85 %
Platelets: 225 10*3/uL (ref 150–400)
RBC: 4.42 MIL/uL (ref 3.87–5.11)
RDW: 13.9 % (ref 11.5–15.5)
WBC: 7.1 10*3/uL (ref 4.0–10.5)

## 2018-02-03 LAB — COMPREHENSIVE METABOLIC PANEL
ALBUMIN: 3.4 g/dL — AB (ref 3.5–5.0)
ALK PHOS: 92 U/L (ref 38–126)
ALT: 45 U/L (ref 14–54)
AST: 45 U/L — ABNORMAL HIGH (ref 15–41)
Anion gap: 10 (ref 5–15)
BILIRUBIN TOTAL: 0.8 mg/dL (ref 0.3–1.2)
BUN: 15 mg/dL (ref 6–20)
CALCIUM: 9.3 mg/dL (ref 8.9–10.3)
CO2: 24 mmol/L (ref 22–32)
CREATININE: 0.85 mg/dL (ref 0.44–1.00)
Chloride: 101 mmol/L (ref 101–111)
GFR calc non Af Amer: 59 mL/min — ABNORMAL LOW (ref >60)
GLUCOSE: 203 mg/dL — AB (ref 65–99)
Potassium: 4.2 mmol/L (ref 3.5–5.1)
Sodium: 135 mmol/L (ref 135–145)
TOTAL PROTEIN: 6.3 g/dL — AB (ref 6.5–8.1)

## 2018-02-03 LAB — PROTIME-INR
INR: 0.97
Prothrombin Time: 12.8 seconds (ref 11.4–15.2)

## 2018-02-03 LAB — APTT: aPTT: 27 seconds (ref 24–36)

## 2018-02-03 LAB — HEMOGLOBIN A1C
Hgb A1c MFr Bld: 5.9 % — ABNORMAL HIGH (ref 4.8–5.6)
Mean Plasma Glucose: 122.63 mg/dL

## 2018-02-03 NOTE — Progress Notes (Signed)
Anesthesia Consult:   Case:  [id number] Date/Time:  02/08/18 1100   Procedure:  TOTAL MASTECTOMY (Left Breast)   Anesthesia type:  General   Pre-op diagnosis:  LEFT BREAST CANCER   Location:  [facility] OR ROOM 02 / [place] [facility]   Surgeon:  Fanny Skates, MD      DISCUSSION:  - Pt is a 84 year old female.   - Asked to see pt at pre-admission testing by Dr. Dalbert Batman. Pt reports anesthesia history of severe post-op N/V.   - Pacemaker interrogated by rep at 01/31/18 visit with Dr. Haroldine Laws. "Pacer interrogated personally with [facility] rep. She is nearing EOL (but not there yet). She is pacer dependent. Pacer will need to be reprogrammed at time of surgery (probably to DOO). Device rep will arrange"   VS: BP (!) 153/69   Pulse 90   Temp 36.9 C   Resp 18   Ht 5' (1.524 m)   Wt 126 lb 8 oz (57.4 kg)   SpO2 100%   BMI 24.71 kg/m   PROVIDERS: Patient Care Team: Fanny Skates, MD as Consulting Physician (General Surgery) [name], [name], MD as Consulting Physician (Oncology) [name], MD as Consulting Physician (Radiation Oncology) Gentry Fitz, MD as Consulting Physician (Family Medicine) Thompson Grayer, MD as Consulting Physician (Cardiology)  - PCP is Stevie Kern, MD - Cardio-oncology provider is Glori Bickers, MD   LABS:  Labs reviewed and are acceptable for surgery  (all labs ordered are listed, but only abnormal results are displayed)  Labs Reviewed  CBC WITH DIFFERENTIAL/PLATELET - Abnormal; Notable for the following components:      Result Value   Lymphs Abs 0.5 (*)    All other components within normal limits  COMPREHENSIVE METABOLIC PANEL - Abnormal; Notable for the following components:   Glucose, Bld 203 (*)    Total Protein 6.3 (*)    Albumin 3.4 (*)    AST 45 (*)    GFR calc non Af Amer 59 (*)    All other components within normal limits  APTT  PROTIME-INR    EKG 02/03/18: atrial sensed V paced rhythm   CV:  Echo 01/31/18:  - Left ventricle: The cavity  size was normal. There was mild focal basal hypertrophy of the septum. Systolic function was normal. The estimated ejection fraction was in the range of 55% to 60%. Wall motion was normal; there were no regional wall motion abnormalities. Doppler parameters are consistent with abnormal left ventricular relaxation (grade 1 diastolic dysfunction). - Aortic valve: There was mild stenosis. Valve area (VTI): 1.37 cm^2. Valve area (Vmax): 1.06 cm^2. Valve area (Vmean): 1.45 cm^2. - Left atrium: The atrium was moderately dilated. - Tricuspid valve: There was mild-moderate regurgitation. - Pulmonary arteries: PA peak pressure: 59 mm Hg (S). - Impressions: LS&' 10.86 cm/s. GLS -15.8 (underestimated due to poor tracking).   Past Medical History:  Diagnosis Date  . Asthma    "as a child"  . Breast cancer ([place]) 12/08/2014   Bilateral  . Cancer of central portion of female breast ([place]) 12/04/2014  . Cancer of central portion of female breast ([place]) 12/04/2014  . Complete heart block ([place]) 1999   s/p PPM by Dr Olevia Perches, most recent generator change 2009  . Complication of anesthesia   . COPD (chronic obstructive pulmonary disease) ([place])   . Dementia   . Diverticulosis   . DJD (degenerative joint disease)    RA  . Esophageal stricture   . Family history of  adverse reaction to anesthesia    Daughter N/V  . GERD (gastroesophageal reflux disease)   . Giant cell arteritis ([place])   . Goiter   . Hemorrhoids   . Hiatal hernia   . IBS (irritable bowel syndrome)   . Paroxysmal atrial fibrillation ([place]) 12/17/2015   asymptomatic, <1%, determined on regular pacemaker interrogation  . Pneumonia   . PONV (postoperative nausea and vomiting)   . S/P radiation therapy 04/24/15 completed   T-11  . Wears glasses     Past Surgical History:  Procedure Laterality Date  . ANAL FISSURE REPAIR    . ARTERY BIOPSY Left 01/03/2013   Procedure: BIOPSY LEFT TEMPORAL ARTERY;  Surgeon: Ascencion Dike, MD;  Location: [facility];  Service: ENT;  Laterality: Left;  . BREAST BIOPSY Right   . CHOLECYSTECTOMY    . COLONOSCOPY    . FRACTURE SURGERY     fx lt wrist  . hemorrhoidectomy    . IR FLUORO GUIDE PORT INSERTION RIGHT  12/22/2017  . IR US GUIDE VASC ACCESS RIGHT  12/22/2017  . left lower lobectomy for brochiectasis  1950  . MOUTH SURGERY     patient unaware  . PACEMAKER INSERTION  1999   Gen change ([facility]) by Dr Olevia Perches 2009  . RECTAL POLYPECTOMY    . RENAL CYST EXCISION    . TONSILLECTOMY      MEDICATIONS: . Calcium Carb-Cholecalciferol (CALTRATE 600+D3 PO)  . diclofenac sodium (VOLTAREN) 1 % GEL  . everolimus (AFINITOR) 2.5 MG tablet  . exemestane (AROMASIN) 25 MG tablet  . lactose free nutrition (BOOST) LIQD  . metoprolol succinate (TOPROL-XL) 50 MG 24 hr tablet  . predniSONE (DELTASONE) 5 MG tablet  . traMADol (ULTRAM) 50 MG tablet  . Turmeric 500 MG TABS   No current facility-administered medications for this encounter.     If no changes, I anticipate pt can proceed with surgery as scheduled.   Willeen Cass, FNP-BC [facility]/Anesthesiology Phone: [phone] 02/07/2018 3:36 PM

## 2018-02-03 NOTE — Progress Notes (Signed)
I faxed a request for peri op device orders to  [facility].  Willeen Cass, NP- C informed me that pacemaker was interrogated 4/15/`9 at Dr. Clayborne Dana office, the representative reported that pacemaker we ould need reprogramming the day of surgery.  I notified Mickel Baas at [facility] with information and date and time of surgery.

## 2018-02-04 ENCOUNTER — Ambulatory Visit: Payer: Medicare Other

## 2018-02-04 ENCOUNTER — Other Ambulatory Visit: Payer: Medicare Other

## 2018-02-04 NOTE — Progress Notes (Deleted)
Received phone call from Dr. Haroldine Laws.  VO given and read back.  Patient is ok for Herceptin.  Ejection fraction is 55-60%/   Royce Macadamia RN

## 2018-02-06 NOTE — H&P (Signed)
Lauren Buchanan Location: [facility] Patient #: [id number] DOB: June 11, 1925 Widowed / Language: Undefined / Race: White Female   History of Present Illness      The patient is a 84 year old female who presents with breast cancer. This is a 84 year old female with good functional status. She is here with her daughter Lauren Buchanan and her other daughter is involved by cell phone with the conversation today. She is referred by Lauren Buchanan for consideration of left palliative mastectomy. Lauren Buchanan  is her cardiologist. Lauren Buchanan is her PCP but she  hasn't seen him in a few years.     In February 2016 she underwent 2 skin biopsies of the right breast skin. One revealed a basal cell carcinoma and the other revealed breast cancer. Subsequently she underwent mammograms. She had 2 core biopsies of 2 separate masses in the left breast and a left axillary lymph node. All of these were invasive lobular carcinoma, receptor positive, HER-2 negative. I don't think she's had any further biopsies of the right breast. On Mar 07, 2015 she underwent biopsy of a lesion at T11 which shows metastatic carcinoma. She is therefore stage IV and has been on suppressive chemotherapy since then.      She has been treated with exemestane and very low-dose everolimus. She is also receiving trastuzumab with fifth dose being given on January 07, 2018. She had a Port-A-Cath placed several weeks ago. She has more recently noticed a bleeding ulcerating area in the lateral left breast. There are no symptoms related to the right breast.     She has some mild memory problems. PET scan on October 15, 2017 shows enlarging bilateral breast masses. Stable T11 lesion. Stable left lung lesion although this is thought to be due to previous thoracotomy for benign disease. She has a pacemaker on the left and a Port-A-Cath on the right      She saw Lauren Buchanan on January 21, 2018. He noted the left breast had a lesion in the  upper outer quadrant measuring about 2 cm on the skin. Not infected but obviously ulcerated tumor. He referred her to me with the thought that we should consider palliative left mastectomy for local control since she may have a few more years and this would hopefully reduce progressive ulceration, bleeding, odor, pain, and possible fixation to the chest wall. I discussed this at length with the patient and her 2 daughters. She wishes to go ahead with left mastectomy rather than face the natural history of progressive left breast cancer..     The right breast cancer is stable and asymptomatic.  We talked about whether to do anything about this and we all agreed to leave the right side alone since she is unlikely to benefit from right mastectomy, and that would add risk to the surgery event.     Comorbidities included complete heart block. Has a pacemaker on the left side. I think we can stay away from this. Bilateral breast cancer with metastasis to T11. Giant cell arteritis takes prednisone 5 mg a day. COPD and bronchiectasis but doesn't require inhalers or oxygen. Port-A-Cath insertion in March 2019 Family history reveals mother was diagnosed with breast cancer but died of heart disease and Father died of stomach cancer and was an alcohol abuser Social history reveals she is a widowed. Retired Network engineer. Lives [facility] assisted [facility]. Son lives in [place]. Daughter lives in [place]. The daughter Lauren Buchanan lives in [place].  They have a disabled son who lives in a group home here in [place]. Denies tobacco or alcohol      We had a one-hour discussion following the exam. Very good review of advantages and disadvantages of surgical intervention and compared to advantages and disadvantages of observation. She will be scheduled for left total mastectomy pending cardiac risk assessment by Dr. Rayann Buchanan. I discussed the indications, details, techniques, and numerous risk of  the surgery with the patient and her 2 daughters. She is aware of the risks of bleeding, infection, temporary or permanent placement in skilled nursing, deterioration of cognitive function, shoulder disability, arm swelling, nerve damage, cardiac pulmonary and thromboembolic problems. She does of her advanced age and she does that she is at increased risk for morbidity and mortality. Essentially the patient and her daughter understand all of these issues. All other questions were answered. They're all in full agreement to proceed with this plan.      We will try to expedite this surgery because of the draining lesion and expedite the cardiology risk assessment.   Allergies  Morphine Sulfate *ANALGESICS - OPIOID*  Amoxicillin ER *PENICILLINS*  Allergies Reconciled   Medication History  Calcium (Oral) Specific strength unknown - Active. Afinitor (2.'5MG'$  Tablet, Oral) Active. Aromasin ('25MG'$  Tablet, Oral) Active. Metoprolol Tartrate ('50MG'$  Tablet, Oral) Active. PredniSONE ('5MG'$  Tablet, Oral) Active. TraMADol HCl ('50MG'$  Tablet, Oral) Active. Turmeric (Oral) Specific strength unknown - Active. Medications Reconciled  Vitals  Weight: 126.8 lb Height: 60in Body Surface Area: 1.54 m Body Mass Index: 24.76 kg/m  Temp.: 55F  Pulse: 82 (Regular)  BP: 120/72 (Sitting, Left Arm, Standard)    Physical Exam  General Mental Status-Alert. General Appearance-Consistent with stated age. Hydration-Well hydrated. Voice-Normal.  Head and Neck Head-normocephalic, atraumatic with no lesions or palpable masses. Trachea-midline. Thyroid Gland Characteristics - normal size and consistency. Note: No cervical adenopathy   Eye Eyeball - Bilateral-Extraocular movements intact. Sclera/Conjunctiva - Bilateral-No scleral icterus.  Chest and Lung Exam Chest and lung exam reveals -quiet, even and easy respiratory effort with no use of accessory muscles and on  auscultation, normal breath sounds, no adventitious sounds and normal vocal resonance. Inspection Chest Wall - Normal. Back - normal. Note: Good air movement. faint wheezing. Port-A-Cath right infraclavicular area. Pacemaker left infraclavicular area Left thoracotomy scar well healed   Breast Note: Left breast reveals a 6-7 cm mass laterally. The 2 cm ulcer. No real odor or infection. She is wearing a Band-Aid. Not actively bleeding. Right breast reveals some thickening throughout but the skin is intact. Minimal axillary adenopathy. Nothing bulkier obviously pathologic. No arm swelling.   Cardiovascular Cardiovascular examination reveals -normal heart sounds, regular rate and rhythm with no murmurs and normal pedal pulses bilaterally. Note: Pacemaker left infraclavicular area. This is fairly high and probably would not be involved in the mastectomy.   Abdomen Inspection Inspection of the abdomen reveals - No Hernias. Skin - Scar - no surgical scars. Palpation/Percussion Palpation and Percussion of the abdomen reveal - Soft, Non Tender, No Rebound tenderness, No Rigidity (guarding) and No hepatosplenomegaly. Auscultation Auscultation of the abdomen reveals - Bowel sounds normal.  Neurologic Neurologic evaluation reveals -alert and oriented x 3 with no impairment of recent or remote memory. Mental Status-Normal.  Neuropsychiatric Note: Pleasant. Appropriate. Well dressed. Ambulating independently. Has a little bit of memory deficit for long and short-term events.   Musculoskeletal Normal Exam - Left-Upper Extremity Strength Normal and Lower Extremity Strength Normal. Normal Exam - Right-Upper Extremity Strength Normal and  Lower Extremity Strength Normal.  Lymphatic Head & Neck  General Head & Neck Lymphatics: Bilateral - Description - Normal. Axillary  General Axillary Region: Bilateral - Description - Normal. Tenderness - Non Tender. Femoral &  Inguinal  Generalized Femoral & Inguinal Lymphatics: Bilateral - Description - Normal. Tenderness - Non Tender.    Assessment & Plan  INFILTRATING DUCTAL CARCINOMA OF BOTH BREASTS IN FEMALE (C50.911)   You have bilateral breast cancer that was diagnosed 3 years ago. You have cancer that has spread to a single bone in your spine called T11 The bone metastasis is stable and you could potentially live several years with that  The cancer in your left breast, however, has progressed and has now eroded through the skin and has started to bleed Lauren Buchanan and I feel that it would be in your best interest to undergo a left mastectomy to control the bleeding, eventual enlargement, odor and pain. You have cancer in your right breast but it is not causing any pain or symptoms. Therefore, removal of the right breast is not mandatory, and you and your daughters have agreed to leave that alone for now. We have discussed this in detail with you and your 2 daughters.  You have agreed to go ahead with scheduling of a left mastectomy in the very near future We have discussed the indications, techniques, and risks of this surgery in detail  We will also ask your cardiologist, Dr. Rayann Buchanan, to provide a cardiac risk assessment and clearance so that we can proceed with the surgery as quickly as possible.  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 General and Minimally invasive Surgery Breast and Colorectal Surgery Office:   [phone] Pager:   [phone]

## 2018-02-08 ENCOUNTER — Encounter (HOSPITAL_COMMUNITY)
Admission: RE | Disposition: A | Discharge status: Skilled Nursing Facility | Payer: Self-pay | Source: Ambulatory Visit | Attending: General Surgery

## 2018-02-08 ENCOUNTER — Inpatient Hospital Stay (HOSPITAL_COMMUNITY)
Admission: RE | Admit: 2018-02-08 | Discharge: 2018-02-11 | DRG: 582 | Disposition: A | Discharge status: Skilled Nursing Facility | Payer: Medicare Other | Source: Ambulatory Visit | Attending: General Surgery | Admitting: General Surgery

## 2018-02-08 ENCOUNTER — Inpatient Hospital Stay (HOSPITAL_COMMUNITY): Payer: Medicare Other | Admitting: Emergency Medicine

## 2018-02-08 ENCOUNTER — Encounter (HOSPITAL_COMMUNITY): Payer: Self-pay | Admitting: General Practice

## 2018-02-08 DIAGNOSIS — F0390 Unspecified dementia without behavioral disturbance: Secondary | ICD-10-CM | POA: Diagnosis not present

## 2018-02-08 DIAGNOSIS — Z803 Family history of malignant neoplasm of breast: Secondary | ICD-10-CM | POA: Diagnosis not present

## 2018-02-08 DIAGNOSIS — Z902 Acquired absence of lung [part of]: Secondary | ICD-10-CM | POA: Diagnosis not present

## 2018-02-08 DIAGNOSIS — Z950 Presence of cardiac pacemaker: Secondary | ICD-10-CM

## 2018-02-08 DIAGNOSIS — Z170 Estrogen receptor positive status [ER+]: Secondary | ICD-10-CM

## 2018-02-08 DIAGNOSIS — C50412 Malignant neoplasm of upper-outer quadrant of left female breast: Secondary | ICD-10-CM

## 2018-02-08 DIAGNOSIS — J479 Bronchiectasis, uncomplicated: Secondary | ICD-10-CM | POA: Diagnosis present

## 2018-02-08 DIAGNOSIS — R443 Hallucinations, unspecified: Secondary | ICD-10-CM | POA: Diagnosis not present

## 2018-02-08 DIAGNOSIS — R413 Other amnesia: Secondary | ICD-10-CM | POA: Diagnosis not present

## 2018-02-08 DIAGNOSIS — R2681 Unsteadiness on feet: Secondary | ICD-10-CM | POA: Diagnosis not present

## 2018-02-08 DIAGNOSIS — C50919 Malignant neoplasm of unspecified site of unspecified female breast: Secondary | ICD-10-CM | POA: Diagnosis present

## 2018-02-08 DIAGNOSIS — R4789 Other speech disturbances: Secondary | ICD-10-CM | POA: Diagnosis not present

## 2018-02-08 DIAGNOSIS — E049 Nontoxic goiter, unspecified: Secondary | ICD-10-CM | POA: Diagnosis not present

## 2018-02-08 DIAGNOSIS — M19032 Primary osteoarthritis, left wrist: Secondary | ICD-10-CM | POA: Diagnosis not present

## 2018-02-08 DIAGNOSIS — R739 Hyperglycemia, unspecified: Secondary | ICD-10-CM | POA: Diagnosis present

## 2018-02-08 DIAGNOSIS — C50819 Malignant neoplasm of overlapping sites of unspecified female breast: Secondary | ICD-10-CM | POA: Diagnosis not present

## 2018-02-08 DIAGNOSIS — J3089 Other allergic rhinitis: Secondary | ICD-10-CM | POA: Diagnosis not present

## 2018-02-08 DIAGNOSIS — I739 Peripheral vascular disease, unspecified: Secondary | ICD-10-CM | POA: Diagnosis not present

## 2018-02-08 DIAGNOSIS — R52 Pain, unspecified: Secondary | ICD-10-CM

## 2018-02-08 DIAGNOSIS — D0501 Lobular carcinoma in situ of right breast: Secondary | ICD-10-CM | POA: Diagnosis present

## 2018-02-08 DIAGNOSIS — K219 Gastro-esophageal reflux disease without esophagitis: Secondary | ICD-10-CM | POA: Diagnosis present

## 2018-02-08 DIAGNOSIS — D0502 Lobular carcinoma in situ of left breast: Secondary | ICD-10-CM | POA: Diagnosis present

## 2018-02-08 DIAGNOSIS — I442 Atrioventricular block, complete: Secondary | ICD-10-CM | POA: Diagnosis present

## 2018-02-08 DIAGNOSIS — M1990 Unspecified osteoarthritis, unspecified site: Secondary | ICD-10-CM | POA: Diagnosis not present

## 2018-02-08 DIAGNOSIS — C50911 Malignant neoplasm of unspecified site of right female breast: Secondary | ICD-10-CM | POA: Diagnosis not present

## 2018-02-08 DIAGNOSIS — K589 Irritable bowel syndrome without diarrhea: Secondary | ICD-10-CM | POA: Diagnosis not present

## 2018-02-08 DIAGNOSIS — C7951 Secondary malignant neoplasm of bone: Secondary | ICD-10-CM | POA: Diagnosis present

## 2018-02-08 DIAGNOSIS — Z79899 Other long term (current) drug therapy: Secondary | ICD-10-CM

## 2018-02-08 DIAGNOSIS — Z9012 Acquired absence of left breast and nipple: Secondary | ICD-10-CM | POA: Diagnosis not present

## 2018-02-08 DIAGNOSIS — I4891 Unspecified atrial fibrillation: Secondary | ICD-10-CM | POA: Diagnosis not present

## 2018-02-08 DIAGNOSIS — M6281 Muscle weakness (generalized): Secondary | ICD-10-CM | POA: Diagnosis not present

## 2018-02-08 DIAGNOSIS — I10 Essential (primary) hypertension: Secondary | ICD-10-CM | POA: Diagnosis present

## 2018-02-08 DIAGNOSIS — M316 Other giant cell arteritis: Secondary | ICD-10-CM | POA: Diagnosis present

## 2018-02-08 DIAGNOSIS — R29898 Other symptoms and signs involving the musculoskeletal system: Secondary | ICD-10-CM | POA: Diagnosis not present

## 2018-02-08 DIAGNOSIS — C50912 Malignant neoplasm of unspecified site of left female breast: Secondary | ICD-10-CM | POA: Diagnosis not present

## 2018-02-08 HISTORY — PX: TOTAL MASTECTOMY: SHX6129

## 2018-02-08 HISTORY — PX: MASTECTOMY: SHX3

## 2018-02-08 LAB — CBC
HCT: 37.8 % (ref 36.0–46.0)
HEMOGLOBIN: 12.4 g/dL (ref 12.0–15.0)
MCH: 28.6 pg (ref 26.0–34.0)
MCHC: 32.8 g/dL (ref 30.0–36.0)
MCV: 87.3 fL (ref 78.0–100.0)
Platelets: 205 10*3/uL (ref 150–400)
RBC: 4.33 MIL/uL (ref 3.87–5.11)
RDW: 13.8 % (ref 11.5–15.5)
WBC: 6.5 10*3/uL (ref 4.0–10.5)

## 2018-02-08 LAB — CREATININE, SERUM: CREATININE: 0.8 mg/dL (ref 0.44–1.00)

## 2018-02-08 SURGERY — MASTECTOMY, SIMPLE
Anesthesia: General | Site: Breast | Laterality: Left

## 2018-02-08 MED ORDER — OXYCODONE HCL 5 MG PO TABS: 5.0000 mg | ORAL_TABLET | Freq: Once | ORAL | Status: DC | PRN

## 2018-02-08 MED ORDER — ONDANSETRON 4 MG PO TBDP: 4.0000 mg | ORAL_TABLET | Freq: Four times a day (QID) | ORAL | Status: DC | PRN

## 2018-02-08 MED ORDER — PREDNISONE 5 MG PO TABS
5.0000 mg | ORAL_TABLET | Freq: Every day | ORAL | Status: DC
  Administered 2018-02-09 – 2018-02-11 (×3): 5 mg via ORAL
  Filled 2018-02-08 (×3): qty 1

## 2018-02-08 MED ORDER — 0.9 % SODIUM CHLORIDE (POUR BTL) OPTIME
TOPICAL | Status: DC | PRN
  Administered 2018-02-08: 1000 mL

## 2018-02-08 MED ORDER — TURMERIC 500 MG PO TABS: 500.0000 mg | ORAL_TABLET | Freq: Every day | ORAL | Status: DC

## 2018-02-08 MED ORDER — FENTANYL CITRATE (PF) 100 MCG/2ML IJ SOLN: 25.0000 ug | INTRAMUSCULAR | Status: DC | PRN

## 2018-02-08 MED ORDER — CEFAZOLIN SODIUM-DEXTROSE 2-3 GM-%(50ML) IV SOLR
INTRAVENOUS | Status: DC | PRN
  Administered 2018-02-08: 2 g via INTRAVENOUS

## 2018-02-08 MED ORDER — SENNA 8.6 MG PO TABS
1.0000 | ORAL_TABLET | Freq: Two times a day (BID) | ORAL | Status: DC
  Administered 2018-02-08 – 2018-02-11 (×7): 8.6 mg via ORAL
  Filled 2018-02-08 (×7): qty 1

## 2018-02-08 MED ORDER — LIDOCAINE 2% (20 MG/ML) 5 ML SYRINGE
INTRAMUSCULAR | Status: DC | PRN
  Administered 2018-02-08: 40 mg via INTRAVENOUS

## 2018-02-08 MED ORDER — PHENYLEPHRINE HCL 10 MG/ML IJ SOLN
INTRAVENOUS | Status: DC | PRN
  Administered 2018-02-08: 20 ug/min via INTRAVENOUS

## 2018-02-08 MED ORDER — ONDANSETRON HCL 4 MG/2ML IJ SOLN
INTRAMUSCULAR | Status: AC
  Filled 2018-02-08: qty 2

## 2018-02-08 MED ORDER — FAMOTIDINE IN NACL 20-0.9 MG/50ML-% IV SOLN
INTRAVENOUS | Status: AC
  Filled 2018-02-08: qty 50

## 2018-02-08 MED ORDER — GABAPENTIN 300 MG PO CAPS
300.0000 mg | ORAL_CAPSULE | ORAL | Status: AC
  Administered 2018-02-08: 300 mg via ORAL

## 2018-02-08 MED ORDER — HYDROCODONE-ACETAMINOPHEN 5-325 MG PO TABS
1.0000 | ORAL_TABLET | ORAL | Status: DC | PRN
  Administered 2018-02-11: 1 via ORAL
  Filled 2018-02-08: qty 1

## 2018-02-08 MED ORDER — LIDOCAINE 2% (20 MG/ML) 5 ML SYRINGE
INTRAMUSCULAR | Status: AC
  Filled 2018-02-08: qty 5

## 2018-02-08 MED ORDER — GABAPENTIN 300 MG PO CAPS
ORAL_CAPSULE | ORAL | Status: AC
  Filled 2018-02-08: qty 1

## 2018-02-08 MED ORDER — TRAMADOL HCL 50 MG PO TABS
50.0000 mg | ORAL_TABLET | Freq: Four times a day (QID) | ORAL | Status: DC | PRN
  Administered 2018-02-08 – 2018-02-11 (×3): 50 mg via ORAL
  Filled 2018-02-08 (×3): qty 1

## 2018-02-08 MED ORDER — DEXAMETHASONE SODIUM PHOSPHATE 10 MG/ML IJ SOLN
INTRAMUSCULAR | Status: DC | PRN
  Administered 2018-02-08: 5 mg via INTRAVENOUS

## 2018-02-08 MED ORDER — BOOST PO LIQD
237.0000 mL | Freq: Every day | ORAL | Status: DC
  Administered 2018-02-08 – 2018-02-10 (×3): 237 mL via ORAL
  Filled 2018-02-08 (×6): qty 237

## 2018-02-08 MED ORDER — METOPROLOL SUCCINATE ER 50 MG PO TB24
50.0000 mg | ORAL_TABLET | Freq: Every day | ORAL | Status: DC
  Administered 2018-02-09 – 2018-02-11 (×3): 50 mg via ORAL
  Filled 2018-02-08 (×3): qty 1

## 2018-02-08 MED ORDER — PANTOPRAZOLE SODIUM 40 MG PO TBEC
40.0000 mg | DELAYED_RELEASE_TABLET | Freq: Every day | ORAL | Status: DC
  Administered 2018-02-08 – 2018-02-11 (×4): 40 mg via ORAL
  Filled 2018-02-08 (×4): qty 1

## 2018-02-08 MED ORDER — PROPOFOL 10 MG/ML IV BOLUS
INTRAVENOUS | Status: DC | PRN
  Administered 2018-02-08: 100 mg via INTRAVENOUS

## 2018-02-08 MED ORDER — DEXAMETHASONE SODIUM PHOSPHATE 10 MG/ML IJ SOLN
INTRAMUSCULAR | Status: AC
  Filled 2018-02-08: qty 1

## 2018-02-08 MED ORDER — FENTANYL CITRATE (PF) 250 MCG/5ML IJ SOLN
INTRAMUSCULAR | Status: DC | PRN
  Administered 2018-02-08 (×2): 25 ug via INTRAVENOUS
  Administered 2018-02-08: 50 ug via INTRAVENOUS

## 2018-02-08 MED ORDER — LACTATED RINGERS IV SOLN
INTRAVENOUS | Status: DC
  Administered 2018-02-08: 16:00:00 via INTRAVENOUS

## 2018-02-08 MED ORDER — FENTANYL CITRATE (PF) 250 MCG/5ML IJ SOLN
INTRAMUSCULAR | Status: AC
  Filled 2018-02-08: qty 5

## 2018-02-08 MED ORDER — CEFAZOLIN SODIUM-DEXTROSE 2-4 GM/100ML-% IV SOLN
INTRAVENOUS | Status: AC
  Filled 2018-02-08: qty 100

## 2018-02-08 MED ORDER — ONDANSETRON HCL 4 MG/2ML IJ SOLN: 4.0000 mg | Freq: Once | INTRAMUSCULAR | Status: DC | PRN

## 2018-02-08 MED ORDER — EXEMESTANE 25 MG PO TABS
25.0000 mg | ORAL_TABLET | Freq: Every day | ORAL | Status: DC
  Administered 2018-02-09 – 2018-02-11 (×3): 25 mg via ORAL
  Filled 2018-02-08 (×4): qty 1

## 2018-02-08 MED ORDER — HYDROMORPHONE HCL 2 MG/ML IJ SOLN: 0.5000 mg | INTRAMUSCULAR | Status: DC | PRN

## 2018-02-08 MED ORDER — CHLORHEXIDINE GLUCONATE CLOTH 2 % EX PADS: 6.0000 | MEDICATED_PAD | Freq: Once | CUTANEOUS | Status: DC

## 2018-02-08 MED ORDER — LACTATED RINGERS IV SOLN
INTRAVENOUS | Status: DC
  Administered 2018-02-08: 10 mL/h via INTRAVENOUS

## 2018-02-08 MED ORDER — ACETAMINOPHEN 500 MG PO TABS
1000.0000 mg | ORAL_TABLET | ORAL | Status: AC
  Administered 2018-02-08: 1000 mg via ORAL

## 2018-02-08 MED ORDER — FENTANYL CITRATE (PF) 100 MCG/2ML IJ SOLN
INTRAMUSCULAR | Status: AC
  Filled 2018-02-08: qty 2

## 2018-02-08 MED ORDER — EVEROLIMUS 2.5 MG PO TABS
2.5000 mg | ORAL_TABLET | Freq: Every day | ORAL | Status: DC
  Administered 2018-02-09 – 2018-02-11 (×3): 2.5 mg via ORAL
  Filled 2018-02-08 (×5): qty 1

## 2018-02-08 MED ORDER — CEFAZOLIN SODIUM-DEXTROSE 2-4 GM/100ML-% IV SOLN
2.0000 g | Freq: Three times a day (TID) | INTRAVENOUS | Status: AC
  Administered 2018-02-08: 2 g via INTRAVENOUS
  Filled 2018-02-08: qty 100

## 2018-02-08 MED ORDER — METHOCARBAMOL 500 MG PO TABS
500.0000 mg | ORAL_TABLET | Freq: Four times a day (QID) | ORAL | Status: DC | PRN
  Administered 2018-02-08 – 2018-02-09 (×3): 500 mg via ORAL
  Filled 2018-02-08 (×3): qty 1

## 2018-02-08 MED ORDER — MIDAZOLAM HCL 2 MG/2ML IJ SOLN
INTRAMUSCULAR | Status: AC
  Filled 2018-02-08: qty 2

## 2018-02-08 MED ORDER — ONDANSETRON HCL 4 MG/2ML IJ SOLN
4.0000 mg | Freq: Four times a day (QID) | INTRAMUSCULAR | Status: DC | PRN
  Administered 2018-02-09: 4 mg via INTRAVENOUS
  Filled 2018-02-08: qty 2

## 2018-02-08 MED ORDER — ONDANSETRON HCL 4 MG/2ML IJ SOLN
INTRAMUSCULAR | Status: DC | PRN
  Administered 2018-02-08: 4 mg via INTRAVENOUS

## 2018-02-08 MED ORDER — HEPARIN SODIUM (PORCINE) 5000 UNIT/ML IJ SOLN
5000.0000 [IU] | Freq: Three times a day (TID) | INTRAMUSCULAR | Status: DC
  Administered 2018-02-09 – 2018-02-11 (×6): 5000 [IU] via SUBCUTANEOUS
  Filled 2018-02-08 (×6): qty 1

## 2018-02-08 MED ORDER — ACETAMINOPHEN 500 MG PO TABS
ORAL_TABLET | ORAL | Status: AC
  Filled 2018-02-08: qty 2

## 2018-02-08 MED ORDER — FENTANYL CITRATE (PF) 100 MCG/2ML IJ SOLN
50.0000 ug | Freq: Once | INTRAMUSCULAR | Status: AC
  Administered 2018-02-08: 50 ug via INTRAVENOUS

## 2018-02-08 MED ORDER — EVEROLIMUS 2.5 MG PO TABS: 2.5000 mg | ORAL_TABLET | Freq: Every day | ORAL | Status: DC

## 2018-02-08 MED ORDER — OXYCODONE HCL 5 MG/5ML PO SOLN: 5.0000 mg | Freq: Once | ORAL | Status: DC | PRN

## 2018-02-08 MED ORDER — FAMOTIDINE IN NACL 20-0.9 MG/50ML-% IV SOLN
20.0000 mg | Freq: Once | INTRAVENOUS | Status: AC
  Administered 2018-02-08: 20 mg via INTRAVENOUS

## 2018-02-08 MED ORDER — PROPOFOL 500 MG/50ML IV EMUL
INTRAVENOUS | Status: DC | PRN
  Administered 2018-02-08: 100 ug/kg/min via INTRAVENOUS

## 2018-02-08 MED ORDER — GABAPENTIN 100 MG PO CAPS
100.0000 mg | ORAL_CAPSULE | Freq: Two times a day (BID) | ORAL | Status: DC
  Administered 2018-02-08 – 2018-02-11 (×7): 100 mg via ORAL
  Filled 2018-02-08 (×7): qty 1

## 2018-02-08 MED ORDER — DICLOFENAC SODIUM 1 % TD GEL
2.0000 g | Freq: Three times a day (TID) | TRANSDERMAL | Status: DC | PRN
  Filled 2018-02-08: qty 100

## 2018-02-08 MED ORDER — CALCIUM-VITAMIN D 500-200 MG-UNIT PO TABS
ORAL_TABLET | Freq: Every day | ORAL | Status: DC
  Administered 2018-02-08: 1 via ORAL
  Filled 2018-02-08 (×2): qty 1

## 2018-02-08 MED ORDER — PROPOFOL 10 MG/ML IV BOLUS
INTRAVENOUS | Status: AC
  Filled 2018-02-08: qty 20

## 2018-02-08 SURGICAL SUPPLY — 53 items
APPLIER CLIP 9.375 MED OPEN (MISCELLANEOUS) ×3
BINDER BREAST LRG (GAUZE/BANDAGES/DRESSINGS) ×3 IMPLANT
BINDER BREAST XLRG (GAUZE/BANDAGES/DRESSINGS) IMPLANT
BIOPATCH RED 1 DISK 7.0 (GAUZE/BANDAGES/DRESSINGS) ×4 IMPLANT
BIOPATCH RED 1IN DISK 7.0MM (GAUZE/BANDAGES/DRESSINGS) ×2
CANISTER SUCT 3000ML PPV (MISCELLANEOUS) ×3 IMPLANT
CHLORAPREP W/TINT 26ML (MISCELLANEOUS) ×3 IMPLANT
CLIP APPLIE 9.375 MED OPEN (MISCELLANEOUS) ×1 IMPLANT
COVER SURGICAL LIGHT HANDLE (MISCELLANEOUS) ×3 IMPLANT
DERMABOND ADVANCED (GAUZE/BANDAGES/DRESSINGS) ×4
DERMABOND ADVANCED .7 DNX12 (GAUZE/BANDAGES/DRESSINGS) ×2 IMPLANT
DEVICE DISSECT PLASMABLAD 3.0S (MISCELLANEOUS) IMPLANT
DRAIN CHANNEL 19F RND (DRAIN) ×6 IMPLANT
DRAPE CHEST BREAST 15X10 FENES (DRAPES) ×3 IMPLANT
DRAPE HALF SHEET 40X57 (DRAPES) ×3 IMPLANT
DRSG TEGADERM 4X4.75 (GAUZE/BANDAGES/DRESSINGS) ×3 IMPLANT
ELECT BLADE 4.0 EZ CLEAN MEGAD (MISCELLANEOUS) ×3
ELECT CAUTERY BLADE 6.4 (BLADE) ×3 IMPLANT
ELECT REM PT RETURN 9FT ADLT (ELECTROSURGICAL) ×3
ELECTRODE BLDE 4.0 EZ CLN MEGD (MISCELLANEOUS) ×1 IMPLANT
ELECTRODE REM PT RTRN 9FT ADLT (ELECTROSURGICAL) ×1 IMPLANT
EVACUATOR SILICONE 100CC (DRAIN) ×6 IMPLANT
GAUZE SPONGE 4X4 12PLY STRL LF (GAUZE/BANDAGES/DRESSINGS) ×3 IMPLANT
GLOVE BIOGEL PI IND STRL 6.5 (GLOVE) ×2 IMPLANT
GLOVE BIOGEL PI IND STRL 7.5 (GLOVE) IMPLANT
GLOVE BIOGEL PI INDICATOR 6.5 (GLOVE) ×4
GLOVE BIOGEL PI INDICATOR 7.5 (GLOVE)
GLOVE EUDERMIC 7 POWDERFREE (GLOVE) ×6 IMPLANT
GLOVE SURG SIGNA 7.5 PF LTX (GLOVE) ×3 IMPLANT
GLOVE SURG SS PI 7.0 STRL IVOR (GLOVE) ×6 IMPLANT
GOWN STRL REUS W/ TWL LRG LVL3 (GOWN DISPOSABLE) ×2 IMPLANT
GOWN STRL REUS W/ TWL XL LVL3 (GOWN DISPOSABLE) ×3 IMPLANT
GOWN STRL REUS W/TWL LRG LVL3 (GOWN DISPOSABLE) ×4
GOWN STRL REUS W/TWL XL LVL3 (GOWN DISPOSABLE) ×6
ILLUMINATOR WAVEGUIDE N/F (MISCELLANEOUS) IMPLANT
KIT BASIN OR (CUSTOM PROCEDURE TRAY) ×3 IMPLANT
KIT TURNOVER KIT B (KITS) ×3 IMPLANT
LIGHT WAVEGUIDE WIDE FLAT (MISCELLANEOUS) IMPLANT
NS IRRIG 1000ML POUR BTL (IV SOLUTION) ×3 IMPLANT
PACK GENERAL/GYN (CUSTOM PROCEDURE TRAY) ×3 IMPLANT
PAD ABD 8X10 STRL (GAUZE/BANDAGES/DRESSINGS) ×3 IMPLANT
PAD ARMBOARD 7.5X6 YLW CONV (MISCELLANEOUS) ×6 IMPLANT
PLASMABLADE 3.0S (MISCELLANEOUS)
SPECIMEN JAR X LARGE (MISCELLANEOUS) ×3 IMPLANT
SUT ETHILON 3 0 FSL (SUTURE) ×6 IMPLANT
SUT MNCRL AB 4-0 PS2 18 (SUTURE) ×3 IMPLANT
SUT SILK 2 0 PERMA HAND 18 BK (SUTURE) ×3 IMPLANT
SUT VIC AB 2-0 CT1 18 (SUTURE) ×3 IMPLANT
SUT VIC AB 3-0 SH 18 (SUTURE) ×6 IMPLANT
TOWEL OR 17X24 6PK STRL BLUE (TOWEL DISPOSABLE) IMPLANT
TOWEL OR 17X26 10 PK STRL BLUE (TOWEL DISPOSABLE) ×3 IMPLANT
TUBE CONNECTING 12'X1/4 (SUCTIONS)
TUBE CONNECTING 12X1/4 (SUCTIONS) IMPLANT

## 2018-02-08 NOTE — Care Management Note (Signed)
Case Management Note  Patient Details  Name: GAYE SCORZA MRN: [id number] Date of Birth: [birth year]  Subjective/Objective:      Admitted with bilateral breast CA , stage IV Malignant ulceration left breast. From ALF.     4/23  s/p  Left total mastectomy   Shelly Rubenstein (Daughter) Tempie Hoist (Other)    [phone]     PCP: Grier Rocher  Action/Plan: Discharge planning in process.Marland KitchenMarland KitchenNCM following for disposition needs.  Expected Discharge Date:                  Expected Discharge Plan:  Assisted Living / Rest Home  In-House Referral:  Clinical Social Work  Discharge planning Services  CM Consult  Post Acute Care Choice:    Choice offered to:     DME Arranged:    DME Agency:     HH Arranged:    HH Agency:     Status of Service:  In process, will continue to follow  If discussed at Long Length of Stay Meetings, dates discussed:    Additional Comments:  Sharin Mons, RN 02/08/2018, 4:51 PM

## 2018-02-08 NOTE — Interval H&P Note (Signed)
History and Physical Interval Note:  02/08/2018 11:44 AM  Lauren Buchanan  has presented today for surgery, with the diagnosis of LEFT BREAST CANCER  The various methods of treatment have been discussed with the patient and family. After consideration of risks, benefits and other options for treatment, the patient has consented to  Procedure(s): TOTAL MASTECTOMY (Left) as a surgical intervention .  The patient's history has been reviewed, patient examined, no change in status, stable for surgery.  I have reviewed the patient's chart and labs.  Questions were answered to the patient's satisfaction.     [name]

## 2018-02-08 NOTE — Anesthesia Preprocedure Evaluation (Addendum)
Anesthesia Evaluation  Patient identified by MRN, date of birth, ID band Patient awake    Reviewed: Allergy & Precautions, H&P , NPO status , Patient's Chart, lab work & pertinent test results, reviewed documented beta blocker date and time   History of Anesthesia Complications (+) PONV and history of anesthetic complications  Airway Mallampati: III  TM Distance: >3 FB Neck ROM: Full    Dental no notable dental hx. (+) Teeth Intact, Dental Advisory Given   Pulmonary asthma , COPD,  COPD inhaler,    breath sounds clear to auscultation       Cardiovascular + Peripheral Vascular Disease  + dysrhythmias (Complete Heart Block) Atrial Fibrillation + pacemaker  Rhythm:Regular Rate:Normal  Giant cell arteritis  Pacemaker interrogated by rep at 01/31/18 visit with Dr. Haroldine Laws. "Pacer interrogated personally with [employer] rep. She is nearing EOL (but not there yet). She is pacer dependent. Pacer will need to be reprogrammed at time of surgery (probably to DOO). Device rep will arrange"  '19 TTE - Mild focal basal hypertrophy of the septum. EF 55% to 60%.Grade 1 diastolic dysfunction. Mild AS. Left atrium was moderately dilated. Mild-moderate TR. PA peak pressure: 59 mm Hg (S).    Neuro/Psych  Headaches, PSYCHIATRIC DISORDERS Dementia  Neuromuscular disease    GI/Hepatic Neg liver ROS, hiatal hernia, GERD  Controlled,IBS, esophageal stricture   Endo/Other  negative endocrine ROS  Renal/GU negative Renal ROS  negative genitourinary   Musculoskeletal  (+) Arthritis , Rheumatoid disorders,    Abdominal   Peds  Hematology negative hematology ROS (+)   Anesthesia Other Findings Pacer dependent with pacer near EOL, to be interrogated day of surgery and placed in DOO. Breast cancer  Reproductive/Obstetrics negative OB ROS                            Anesthesia Physical  Anesthesia Plan  ASA: III  Anesthesia  Plan: General and Regional   Post-op Pain Management:  Regional for Post-op pain   Induction: Intravenous  PONV Risk Score and Plan: 4 or greater and Treatment may vary due to age or medical condition, Ondansetron, Propofol infusion and Dexamethasone  Airway Management Planned: LMA  Additional Equipment: None  Intra-op Plan:   Post-operative Plan: Extubation in OR  Informed Consent: I have reviewed the patients History and Physical, chart, labs and discussed the procedure including the risks, benefits and alternatives for the proposed anesthesia with the patient or authorized representative who has indicated his/her understanding and acceptance.   Dental advisory given  Plan Discussed with: CRNA, Anesthesiologist and Surgeon  Anesthesia Plan Comments:        Anesthesia Quick Evaluation

## 2018-02-08 NOTE — Anesthesia Procedure Notes (Signed)
Procedure Name: LMA Insertion Date/Time: 02/08/2018 11:45 AM Performed by: Freddie Breech, CRNA Pre-anesthesia Checklist: Patient identified, Emergency Drugs available, Suction available and Patient being monitored Patient Re-evaluated:Patient Re-evaluated prior to induction Oxygen Delivery Method: Circle System Utilized Preoxygenation: Pre-oxygenation with 100% oxygen Induction Type: IV induction Ventilation: Mask ventilation without difficulty LMA: LMA inserted LMA Size: 3.0 Number of attempts: 1 Airway Equipment and Method: Bite block Placement Confirmation: positive ETCO2 Tube secured with: Tape Dental Injury: Teeth and Oropharynx as per pre-operative assessment

## 2018-02-08 NOTE — Op Note (Signed)
Patient Name:           Lauren Buchanan   Date of Surgery:        02/08/2018  Pre op Diagnosis:      Bilateral breast cancer, stage IV                                       Malignant ulceration left breast                                         Post op Diagnosis:    Same  Procedure:                 Left total mastectomy  Surgeon:                     Edsel Petrin. Dalbert Batman, M.D., FACS  Assistant:                      Alphonsa Overall, MD   Indication for Assistant: Complex dissection and anatomy due to locally advanced cancer with ulceration.  Expedite procedure in medically frail patient  Operative Indications:   . This is a 84 year old female who is brought to the operating room electively for palliative left mastectomy. She is referred by Dr. Jana Hakim for consideration of left palliative mastectomy. Dr. Joylene Grapes  is her cardiologist.      In February 2016 she underwent 2 skin biopsies of the right breast skin. One revealed a basal cell carcinoma and the other revealed breast cancer. Subsequently she underwent mammograms. She had 2 core biopsies of 2 separate masses in the left breast and a left axillary lymph node. All of these were invasive lobular carcinoma, receptor positive, HER-2 negative. On Mar 07, 2015 she underwent biopsy of a lesion at T11 which shows metastatic carcinoma. She is therefore stage IV and has been on suppressive chemotherapy since then.     She had a Port-A-Cath placed several weeks ago. She has more recently noticed a bleeding ulcerating area in the lateral left breast. There are no symptoms related to the right breast.    PET scan on October 15, 2017 shows enlarging bilateral breast masses. Stable T11 lesion. Stable left lung lesion although this is thought to be due to previous left thoracotomy for benign disease. She has a pacemaker on the left and a Port-A-Cath on the right      She saw Dr. Jana Hakim on January 21, 2018. He noted the left breast had a lesion  in the upper outer quadrant measuring about 2 cm on the skin. Not infected but obviously ulcerated tumor. He referred her to me with the thought that we should consider palliative left mastectomy for local control since she may have a few more years and this would hopefully reduce progressive ulceration, bleeding, odor, pain, and possible fixation to the chest wall. I discussed this at length with the patient and her 2 daughters. She wishes to go ahead with left mastectomy rather than face the natural history of progressive left breast cancer..     The right breast cancer is stable and asymptomatic.  We talked about whether to do anything about this and we all agreed to leave the right side alone since she is unlikely to benefit from right mastectomy,  and that would add risk to the surgery event.     Comorbidities included complete heart block. Has a pacemaker on the left side. I think we can stay away from this. Bilateral breast cancer with metastasis to T11. Giant cell arteritis takes prednisone 5 mg a day. COPD and bronchiectasis but doesn't require inhalers or oxygen. Port-A-Cath insertion in March 2019    I recently evaluated the patient in the office in the presence of both of her daughters.  Everyone is in agreement to go ahead with left palliative mastectomy to avoid progressive symptoms from her left breast ulceration.    Operative Findings:       There is a 2 cm ulcerated area in the lateral left breast.  There is a palpable mass laterally.  Lymph nodes were not grossly enlarged.  The pacemaker was noted in the left infraclavicular area.  We were able to avoid injury to the pacemaker.  We did a standard left mastectomy including the tail of Spence and probably removed a couple of lymph nodes from the left axilla.  Procedure in Detail:          Following the induction of general endotracheal anesthesia the patient's entire left chest wall shoulder and axilla were prepped and draped in a  sterile fashion.  Intravenous antibiotics were given.  Surgical timeout was performed.  Using a marking pen I planned a transverse elliptical incision.  This incision had to be carried laterally a good deal because of the lateral nature of the ulcerated tumor.  The incision was made.  Skin flaps were raised medially to the parasternal area, superiorly to the infraclavicular area.  In the area of the pacemaker I simply went behind the pacemaker without disturbing it.  Laterally the skin flaps went to the latissimus dorsi muscle and inferiorly wound to the anterior rectus sheath.  The lateral skin margin was marked with silk suture.  The breast was dissected off of the pectoralis major and pectoralis minor muscles with electrocautery.  Dissection was carried down to the anterior border of the latissimus dorsi muscle and the tail of Spence and a couple of lymph nodes seem to come with the specimen.  The specimen was sent to the lab.  The wound was copiously irrigated.  Hemostasis was excellent and achieved with electrocautery.  The axillary contents were clamped and divided between Kelly clamps and ligated with 2-0 Vicryl ties.  After irrigating the wound and assuring hemostasis I placed two 19 French Blake drains,  one across the skin flaps and one up toward the axilla.  These were brought out through separate stab incisions inferiorly, connected to suction bulbs, and sutured to  the skin with nylon sutures.  The mastectomy incision was closed with in 2 layers.  The inner layer was subcutaneous interrupted sutures of 3-0 Vicryl.  The skin was closed with a running subcuticular 4-0 Monocryl and Dermabond.  Dry bandages and a breast binder were placed.  The patient tolerated the procedure well and was taken to PACU in stable condition.  EBL 75 cc or less.  Counts correct.  Complications none.   Addendum: I logged known to the [facility] website and reviewed her prescription medication history     Jameson Morrow M. Dalbert Batman,  M.D., FACS General and Minimally Invasive Surgery Breast and Colorectal Surgery  02/08/2018 1:09 PM

## 2018-02-08 NOTE — Transfer of Care (Signed)
Immediate Anesthesia Transfer of Care Note  Patient: Lauren Buchanan  Procedure(s) Performed: TOTAL MASTECTOMY LEFT (Left Breast)  Patient Location: PACU  Anesthesia Type:GA combined with regional for post-op pain  Level of Consciousness: drowsy and patient cooperative  Airway & Oxygen Therapy: Patient Spontanous Breathing and Patient connected to face mask oxygen  Post-op Assessment: Report given to RN, Post -op Vital signs reviewed and stable and Patient moving all extremities X 4  Post vital signs: Reviewed and stable  Last Vitals:  Vitals Value Taken Time  BP 173/80 02/08/2018  1:41 PM  Temp 36.2 C 02/08/2018  1:41 PM  Pulse 85 02/08/2018  1:46 PM  Resp 12 02/08/2018  1:46 PM  SpO2 98 % 02/08/2018  1:46 PM  Vitals shown include unvalidated device data.  Last Pain:  Vitals:   02/08/18 1341  TempSrc:   PainSc: Asleep      Patients Stated Pain Goal: 3 (56/[id number]/16 [id number])  Complications: No apparent anesthesia complications

## 2018-02-09 ENCOUNTER — Other Ambulatory Visit: Payer: Self-pay

## 2018-02-09 ENCOUNTER — Encounter (HOSPITAL_COMMUNITY): Payer: Self-pay | Admitting: General Practice

## 2018-02-09 LAB — BASIC METABOLIC PANEL
Anion gap: 10 (ref 5–15)
BUN: 11 mg/dL (ref 6–20)
CHLORIDE: 102 mmol/L (ref 101–111)
CO2: 23 mmol/L (ref 22–32)
CREATININE: 0.87 mg/dL (ref 0.44–1.00)
Calcium: 8.5 mg/dL — ABNORMAL LOW (ref 8.9–10.3)
GFR calc Af Amer: >60 mL/min (ref >60)
GFR calc non Af Amer: 57 mL/min — ABNORMAL LOW (ref >60)
Glucose, Bld: 237 mg/dL — ABNORMAL HIGH (ref 65–99)
POTASSIUM: 4 mmol/L (ref 3.5–5.1)
SODIUM: 135 mmol/L (ref 135–145)

## 2018-02-09 LAB — HEMOGLOBIN A1C
Hgb A1c MFr Bld: 6.1 % — ABNORMAL HIGH (ref 4.8–5.6)
Mean Plasma Glucose: 128.37 mg/dL

## 2018-02-09 LAB — CBC
HEMATOCRIT: 35.6 % — AB (ref 36.0–46.0)
Hemoglobin: 11.2 g/dL — ABNORMAL LOW (ref 12.0–15.0)
MCH: 27.5 pg (ref 26.0–34.0)
MCHC: 31.5 g/dL (ref 30.0–36.0)
MCV: 87.5 fL (ref 78.0–100.0)
PLATELETS: 222 10*3/uL (ref 150–400)
RBC: 4.07 MIL/uL (ref 3.87–5.11)
RDW: 13.8 % (ref 11.5–15.5)
WBC: 7.7 10*3/uL (ref 4.0–10.5)

## 2018-02-09 LAB — GLUCOSE, CAPILLARY
Glucose-Capillary: 135 mg/dL — ABNORMAL HIGH (ref 65–99)
Glucose-Capillary: 131 mg/dL — ABNORMAL HIGH (ref 65–99)

## 2018-02-09 MED ORDER — SODIUM CHLORIDE 0.9% FLUSH: 10.0000 mL | INTRAVENOUS | Status: DC | PRN

## 2018-02-09 MED ORDER — INSULIN ASPART 100 UNIT/ML ~~LOC~~ SOLN
0.0000 [IU] | Freq: Three times a day (TID) | SUBCUTANEOUS | Status: DC
  Administered 2018-02-09: 1 [IU] via SUBCUTANEOUS
  Administered 2018-02-10: 2 [IU] via SUBCUTANEOUS
  Administered 2018-02-10: 1 [IU] via SUBCUTANEOUS

## 2018-02-09 MED ORDER — CALCIUM CARBONATE-VITAMIN D 500-200 MG-UNIT PO TABS
1.0000 | ORAL_TABLET | Freq: Every day | ORAL | Status: DC
  Administered 2018-02-09 – 2018-02-11 (×3): 1 via ORAL
  Filled 2018-02-09 (×3): qty 1

## 2018-02-09 NOTE — Anesthesia Postprocedure Evaluation (Signed)
Anesthesia Post Note  Patient: Lauren Buchanan  Procedure(s) Performed: TOTAL MASTECTOMY LEFT (Left Breast)     Patient location during evaluation: PACU Anesthesia Type: General Level of consciousness: awake and alert Pain management: pain level controlled Vital Signs Assessment: post-procedure vital signs reviewed and stable Respiratory status: spontaneous breathing, nonlabored ventilation, respiratory function stable and patient connected to nasal cannula oxygen Cardiovascular status: blood pressure returned to baseline and stable Postop Assessment: no apparent nausea or vomiting Anesthetic complications: no    Last Vitals:  Vitals:   02/09/18 1000 02/09/18 1400  BP: (!) 164/72 (!) 188/79  Pulse: 80 78  Resp: 16 17  Temp: 36.5 C (!) 36.4 C  SpO2: 100% 100%    Last Pain:  Vitals:   02/09/18 1400  TempSrc: Axillary  PainSc: 0-No pain                 Deklyn Trachtenberg

## 2018-02-09 NOTE — Progress Notes (Signed)
1 Day Post-Op  Subjective: Alert.  Has been ambulating in halls.  Sitting in a chair currently. Says she has no pain Does not remember that she had surgery but is aware of her surroundings.  Speech normal.  Pleasant.  BP 164/72.  I cut her IV back TKO. Glucose 203 yesterday.  237 today. Hemoglobin 11.2.  Potassium 4.0.  Creatinine 0.87. JP drainage total 85 cc.  Serosanguineous   Objective: Vital signs in last 24 hours: Temp:  [97.5 F (36.4 C)-97.9 F (36.6 C)] 97.7 F (36.5 C) (04/24 1000) Pulse Rate:  [69-80] 80 (04/24 1000) Resp:  [12-16] 16 (04/24 1000) BP: (120-164)/(60-84) 164/72 (04/24 1000) SpO2:  [94 %-100 %] 100 % (04/24 1000) Last BM Date: 02/07/18  Intake/Output from previous day: 04/23 0701 - 04/24 0700 In: 1144.8 [I.V.:1044.8; IV Piggyback:100] Out: 137 [Urine:2; Drains:85; Blood:50] Intake/Output this shift: No intake/output data recorded.  General appearance: Alert.  Pleasant.  Oriented to person place and situation.  No agitation.  Color good skin warm and dry Resp: clear to auscultation bilaterally Chest wall: no tenderness, Left mastectomy incision looks good.  No necrosis.  No hematoma.  Drainage serosanguineous Extremities: extremities normal, atraumatic, no cyanosis or edema.  Has a DuoDERM left elbow.  Lab Results:  Results for orders placed or performed during the hospital encounter of 02/08/18 (from the past 24 hour(s))  CBC     Status: None   Collection Time: 02/08/18  3:16 PM  Result Value Ref Range   WBC 6.5 4.0 - 10.5 K/uL   RBC 4.33 3.87 - 5.11 MIL/uL   Hemoglobin 12.4 12.0 - 15.0 g/dL   HCT 37.8 36.0 - 46.0 %   MCV 87.3 78.0 - 100.0 fL   MCH 28.6 26.0 - 34.0 pg   MCHC 32.8 30.0 - 36.0 g/dL   RDW 13.8 11.5 - 15.5 %   Platelets 205 150 - 400 K/uL  Creatinine, serum     Status: None   Collection Time: 02/08/18  3:16 PM  Result Value Ref Range   Creatinine, Ser 0.80 0.44 - 1.00 mg/dL   GFR calc non Af Amer >60 >60 mL/min   GFR calc Af  Amer >60 >60 mL/min  Basic metabolic panel     Status: Abnormal   Collection Time: 02/09/18  4:56 AM  Result Value Ref Range   Sodium 135 135 - 145 mmol/L   Potassium 4.0 3.5 - 5.1 mmol/L   Chloride 102 101 - 111 mmol/L   CO2 23 22 - 32 mmol/L   Glucose, Bld 237 (H) 65 - 99 mg/dL   BUN 11 6 - 20 mg/dL   Creatinine, Ser 0.87 0.44 - 1.00 mg/dL   Calcium 8.5 (L) 8.9 - 10.3 mg/dL   GFR calc non Af Amer 57 (L) >60 mL/min   GFR calc Af Amer >60 >60 mL/min   Anion gap 10 5 - 15  CBC     Status: Abnormal   Collection Time: 02/09/18  4:56 AM  Result Value Ref Range   WBC 7.7 4.0 - 10.5 K/uL   RBC 4.07 3.87 - 5.11 MIL/uL   Hemoglobin 11.2 (L) 12.0 - 15.0 g/dL   HCT 35.6 (L) 36.0 - 46.0 %   MCV 87.5 78.0 - 100.0 fL   MCH 27.5 26.0 - 34.0 pg   MCHC 31.5 30.0 - 36.0 g/dL   RDW 13.8 11.5 - 15.5 %   Platelets 222 150 - 400 K/uL     Studies/Results: No results  found.  . calcium-vitamin D  1 tablet Oral Daily  . everolimus  2.5 mg Oral Daily  . exemestane  25 mg Oral QPC breakfast  . gabapentin  100 mg Oral BID  . heparin  5,000 Units Subcutaneous Q8H  . insulin aspart  0-9 Units Subcutaneous TID WC  . lactose free nutrition  237 mL Oral Daily  . metoprolol succinate  50 mg Oral Daily  . pantoprazole  40 mg Oral Daily  . predniSONE  5 mg Oral Q breakfast  . senna  1 tablet Oral BID     Assessment/Plan: s/p Procedure(s): TOTAL MASTECTOMY LEFT   POD#1 -left total mastectomy Stable surgically.  Continue both JP drains.  She will need these for 2 weeks or so Diet as tolerated Twice daily stool softeners Ambulate in hall Teach drain care Await pathology  Multiple comorbidities    Complete heart block    Pacemaker    Acquired bronchiectasis    Giant cell arteritis    History left lung lobectomy, remote    Memory impairment of gradual onset    Family history of breast cancer   Hyperglycemia    Will monitor and put on SSI hopefully this will settle down quickly  She  will need SNF post discharge Plan discharge Friday, April 26 [facility] [place] SW involved CM involved  PT consulted   @PROBHOSP @  LOS: 1 day    [name] 02/09/2018  . .prob

## 2018-02-09 NOTE — Clinical Social Work Note (Signed)
Clinical Social Work Assessment  Patient Details  Name: Lauren Buchanan MRN: [id number] Date of Birth: 02/06/1925  Date of referral:  02/09/18               Reason for consult:  Facility Placement, Discharge Planning                Permission sought to share information with:  Facility Contact Representative, Family Supports Permission granted to share information::  Yes, Verbal Permission Granted  Name::     Lauren Buchanan  Agency::  [facility]  Relationship::  daughter  Contact Information:   [phone]  Housing/Transportation Living arrangements for the past 2 months:  Assisted Living Facility Source of Information:  Patient, Adult Children Patient Interpreter Needed:  None Criminal Activity/Legal Involvement Pertinent to Current Situation/Hospitalization:  No - Comment as needed Significant Relationships:  Adult Children, Community Support, Other Family Members Lives with:  Self, Facility Resident Do you feel safe going back to the place where you live?  Yes Need for family participation in patient care:  Yes (Comment)  Care giving concerns:  Pt is from ALF, has resided there since 2016. Pt will require some skilled nursing for dressing changes following surgery. Pt oriented to self, situation, and place but does exhibit some memory loss during assessment with pt daughter.    Social Worker assessment / plan:  CSW met with pt and pt daughter at bedside, pt daughter is good support for pt from assessment. Pt states she is from [facility] (where she has lived for past three years) and is happy with her place there, pt daughter states pt lives in ALF, and she also is happy with care there. Pt and pt daughter have arranged return to SNF at [facility]. Pt daughter states she lives in [place] and is available for help for her mother at any time, she is working on moving back towards the center of [place] in order to cut down on drive time between home, her mom,  and other things. Pt expressed no further concerns, states her doctor said she would be able to discharge on Friday.   CSW will continue to follow.   Employment status:  Retired Insurance information:  Medicare PT Recommendations:  Not assessed at this time Information / Referral to community resources:  Skilled Nursing Facility  Patient/Family's Response to care:  Pt and pt daughter were amenable to care plans and appreciative for CSW visit and support with facilitation of discharge when medically appropriate.   Patient/Family's Understanding of and Emotional Response to Diagnosis, Current Treatment, and Prognosis:  Pt and pt daughter state understanding of diagnosis, current treatment and prognosis. Pt daughter states appropriate understanding of pt care needs and limitations. Pt was in good spirits during assessment, smiling and joking with this writer. Pt states that "I don't even feel like I had surgery!"  Emotional Assessment Appearance:  Appears stated age, Well-Groomed Attitude/Demeanor/Rapport:  Charismatic, Gracious, Engaged Affect (typically observed):  Accepting, Adaptable, Appropriate, Pleasant, Happy Orientation:  Oriented to Self, Oriented to Place, Fluctuating Orientation (Suspected and/or reported Sundowners) Alcohol / Substance use:  Not Applicable Psych involvement (Current and /or in the community):  No (Comment)  Discharge Needs  Concerns to be addressed:  Care Coordination Readmission within the last 30 days:  No Current discharge risk:  Physical Impairment Barriers to Discharge:  Continued Medical Work up, Insurance Authorization    H , LCSWA 02/09/2018, 9:34 AM  

## 2018-02-09 NOTE — Progress Notes (Signed)
Pt.BP is 152/74 MD, notified and no order,will continue to monitor.

## 2018-02-09 NOTE — Social Work (Signed)
CSW spoke with Joellen Jersey at [facility], there may not be a SNF bed at either [facility] [place] available for pt at discharge, but pt is able to discharge back to ALF with [facility] services if appropriate.   CSW continuing to follow.  [name], [place] Work [phone]

## 2018-02-10 ENCOUNTER — Other Ambulatory Visit: Payer: Self-pay | Admitting: Oncology

## 2018-02-10 LAB — GLUCOSE, CAPILLARY
GLUCOSE-CAPILLARY: 155 mg/dL — AB (ref 65–99)
Glucose-Capillary: 129 mg/dL — ABNORMAL HIGH (ref 65–99)
Glucose-Capillary: 117 mg/dL — ABNORMAL HIGH (ref 65–99)

## 2018-02-10 MED ORDER — ACETAMINOPHEN 325 MG PO TABS: 650.0000 mg | ORAL_TABLET | Freq: Four times a day (QID) | ORAL | Status: DC | PRN

## 2018-02-10 NOTE — Consult Note (Signed)
[facility] CM Primary Care Navigator  02/10/2018  Lauren Buchanan [birth year] [id number]  Went to see patient at the bedside to identify possible discharge needs. Met with her daughter [name]) as well.  Daughter mentioned that patient resides at [facility] facility.  Patient's daughter reports that she was referred by Dr. Jana Hakim (oncologist) for consideration of left palliative mastectomy that resulted to this admission/ surgery. (status post total left mastectomy)  Daughter states thatfacility staffprovide care for patient'sneeds which includesdispensing andadministering of her medications.  Her daughter mentioned providing transportation to her doctors' appointments.  According to patient's daughter, Dr. Christie Nottingham is her primary care provider but hasn't seen him in years and no longer goes to his office. Patient is being seen in the facility by facility physician and will continue to follow-up with Dr. Jana Hakim after discharge.  Perpatient's daughter, discharge plan is to [facility] the skilled nursing facility (SNF) level of care for rehabilitation. Daughter indicated having no pressing issues or needs at this time.  Nofurther health management needs or concerns noted at this point.   For additional questions please contact:  Edwena Felty A. Lorali Khamis, BSN, RN-BC [facility] PRIMARY CARE Navigator Cell: [phone]

## 2018-02-10 NOTE — Progress Notes (Signed)
Lauren Buchanan had her surgery and is still in the hospital.  She was supposed to see me 04 26 and have her next treatment 0503, but I think that is too early, so she will see me on 38.  I called her daughter and gave her that information.

## 2018-02-10 NOTE — Progress Notes (Addendum)
2 Days Post-Op  Subjective: Stable and alert.  More confused at night.  Less memory problems during the day.  No agitation. Daughter spent the night and is present with her this morning Eating some Ambulating in halls with assistance Await PT consult  JP drain is functioning.  Drainage then.  Output moderate.  BP 179/75.  Heart rate 73.  Pacemaker.  SPO2 100% on room air. Capillary glucose levels have normalized.. Now 131.  [facility].  Objective: Vital signs in last 24 hours: Temp:  [97.5 F (36.4 C)-98.2 F (36.8 C)] 98.2 F (36.8 C) (04/25 0543) Pulse Rate:  [73-80] 73 (04/25 0543) Resp:  [16-17] 16 (04/25 0543) BP: (164-188)/(72-79) 179/75 (04/25 0543) SpO2:  [96 %-100 %] 100 % (04/25 0543) Last BM Date: 02/07/18  Intake/Output from previous day: 04/24 0701 - 04/25 0700 In: 1230 [P.O.:360; I.V.:780] Out: 115 [Drains:115] Intake/Output this shift: No intake/output data recorded.  General appearance: Awake and alert.  Speech normal.  Oriented to place and situation.  Some short-term memory recognized.  Says she had some hallucinations last night but feels fine now. Chest wall: no tenderness, Port right infraclavicular area is now accessed.  Left mastectomy wound looks good.  Skin edges healthy.  No necrosis.  Flaps adherent to chest wall without hematoma or seroma.  Both drains functioning with thin serosanguineous drainage. Cardio: Regular rhythm.  Pacemaker. GI: soft, non-tender; bowel sounds normal; no masses,  no organomegaly Extremities: extremities normal, atraumatic, no cyanosis or edema  Lab Results:  Results for orders placed or performed during the hospital encounter of 02/08/18 (from the past 24 hour(s))  Glucose, capillary     Status: Abnormal   Collection Time: 02/09/18  5:42 PM  Result Value Ref Range   Glucose-Capillary 135 (H) 65 - 99 mg/dL  Glucose, capillary     Status: Abnormal   Collection Time: 02/09/18   9:20 PM  Result Value Ref Range   Glucose-Capillary 131 (H) 65 - 99 mg/dL     Studies/Results: No results found.  . calcium-vitamin D  1 tablet Oral Daily  . everolimus  2.5 mg Oral Daily  . exemestane  25 mg Oral QPC breakfast  . gabapentin  100 mg Oral BID  . heparin  5,000 Units Subcutaneous Q8H  . insulin aspart  0-9 Units Subcutaneous TID WC  . lactose free nutrition  237 mL Oral Daily  . metoprolol succinate  50 mg Oral Daily  . pantoprazole  40 mg Oral Daily  . predniSONE  5 mg Oral Q breakfast  . senna  1 tablet Oral BID     Assessment/Plan: s/p Procedure(s): TOTAL MASTECTOMY LEFT   POD#2  -  left total mastectomy Stable surgically.  Continue both JP drains.  She will need these for 2 weeks or so Diet as tolerated Twice daily stool softeners Ambulate in hall Teach drain care Await pathology  Hypertension  - Systolic; possibly will need further treatment, but this will have to be done gently due to age and pacemaker.  I will consult cardiology this morning and ask their advice.  She is followed by Dr. Rayann Heman as  outpatient.  Multiple comorbidities    Complete heart block    Pacemaker    Acquired bronchiectasis    Giant cell arteritis    History left lung lobectomy, remote    Memory impairment of gradual onset    Family history of breast cancer   Hyperglycemia    This  is now not a problem.  Continue to monitor with CBGs.  SSI available if needed.   VTE prophylaxis - [place] heparin  She will need SNF post discharge Plan discharge Friday, April 26 back to [name] [place].  This was discussed and planned with [facility]. SW involved CM involved  PT consulted I have discussed her disposition plan with bedside nursing this morning.  They are going to contact SW and CM and PT to make sure everything is in order from their standpoint.  Followup Dr. Jana Hakim  5/17 Follow up Dr. Dalbert Batman:   10-12 days. To  call   @PROBHOSP @  LOS: 2 days    [name] 02/10/2018  . .prob

## 2018-02-10 NOTE — Evaluation (Signed)
Physical Therapy Evaluation Patient Details Name: Lauren Buchanan MRN: [id number] DOB: 01-Jul-1925 Today's Date: 02/10/2018   History of Present Illness  Pt is a 84 y/o female s/p L total mastectomy for breast cancer. PMH including but not limited to COPD and PAF.  Clinical Impression  Pt presented supine in bed with HOB elevated, awake and willing to participate in therapy session. [name] present throughout session as well. Prior to admission, pt reported that she was independent with all functional mobility and ADLs. Pt lives at an ALF. Pt currently able to perform bed mobility with supervision, transfers with min guard for safety and ambulated in hallway with min guard without use of an AD. Per mobility tech, pt ambulated this morning as well (a few laps around the unit) with no difficulties without use of an AD. Pt with "mild" pain at incision site with L UE AROM but otherwise no increase in pain. Pt would greatly benefit from ongoing PT services in the [place] for [place]. PT will continue to follow acutely to progress mobility as tolerated and to ensure a safe d/c home.    Follow Up Recommendations Outpatient PT;Other (comment)(OP PT Cancer Rehab)    Equipment Recommendations  None recommended by PT    Recommendations for Other Services       Precautions / Restrictions Precautions Precautions: Fall Precaution Comments: jp drain L chest Restrictions Weight Bearing Restrictions: No      Mobility  Bed Mobility Overal bed mobility: Needs Assistance Bed Mobility: Supine to Sit;Sit to Supine     Supine to sit: Supervision Sit to supine: Supervision   General bed mobility comments: supervision for safety  Transfers Overall transfer level: Needs assistance Equipment used: None Transfers: Sit to/from Stand Sit to Stand: Min guard         General transfer comment: min guard for safety, no AD  Ambulation/Gait Ambulation/Gait assistance: Min  guard Ambulation Distance (Feet): 125 Feet Assistive device: None Gait Pattern/deviations: Step-through pattern;Decreased stride length;Drifts right/left Gait velocity: decreased Gait velocity interpretation: 1.31 - 2.62 ft/sec, indicative of limited community ambulator General Gait Details: pt with mild instability but able to self correct minor LOB without any UE support or AD  Stairs            Wheelchair Mobility    Modified Rankin (Stroke Patients Only)       Balance Overall balance assessment: Needs assistance Sitting-balance support: Feet supported Sitting balance-Leahy Scale: Good     Standing balance support: During functional activity;No upper extremity supported Standing balance-Leahy Scale: Fair Standing balance comment: static standing balance is good, dynamic is fair                             Pertinent Vitals/Pain Pain Assessment: Faces Faces Pain Scale: Hurts little more Pain Location: L chest, incision site Pain Descriptors / Indicators: Sore Pain Intervention(s): Monitored during session;Repositioned    Home Living Family/patient expects to be discharged to:: Assisted living Living Arrangements: Alone             Home Equipment: Kasandra Knudsen - single point      Prior Function Level of Independence: Independent         Comments: assistance with medicine management     Hand Dominance        Extremity/Trunk Assessment   Upper Extremity Assessment Upper Extremity Assessment: RUE deficits/detail;LUE deficits/detail RUE Deficits / Details: AROM limited to ~100 degrees shoulder flexion  and to ~90 degrees shoulder abduction; pt functionally able to reach behind her head and behind her back LUE Deficits / Details: AROM limited to ~100 degrees shoulder flexion and to ~90 degrees shoulder abduction; pt functionally able to reach behind her head and behind her back    Lower Extremity Assessment Lower Extremity Assessment: Overall  WFL for tasks assessed    Cervical / Trunk Assessment Cervical / Trunk Assessment: Kyphotic  Communication   Communication: No difficulties  Cognition Arousal/Alertness: Awake/alert Behavior During Therapy: WFL for tasks assessed/performed Overall Cognitive Status: Impaired/Different from baseline Area of Impairment: Memory;Safety/judgement;Problem solving                     Memory: Decreased short-term memory   Safety/Judgement: Decreased awareness of safety;Decreased awareness of deficits   Problem Solving: Requires verbal cues        General Comments      Exercises     Assessment/Plan    PT Assessment Patient needs continued PT services  PT Problem List Decreased balance;Decreased mobility;Decreased coordination;Decreased cognition;Decreased safety awareness;Decreased knowledge of precautions       PT Treatment Interventions DME instruction;Gait training;Stair training;Functional mobility training;Therapeutic activities;Therapeutic exercise;Balance training;Neuromuscular re-education;Patient/family education    PT Goals (Current goals can be found in the Care Plan section)  Acute Rehab PT Goals Patient Stated Goal: per pt to return home; per daughter to go to SNF ~2 weeks prior to returning home PT Goal Formulation: With patient/family Time For Goal Achievement: 02/24/18 Potential to Achieve Goals: Good    Frequency Min 3X/week   Barriers to discharge        Co-evaluation               AM-PAC PT "6 Clicks" Daily Activity  Outcome Measure Difficulty turning over in bed (including adjusting bedclothes, sheets and blankets)?: None Difficulty moving from lying on back to sitting on the side of the bed? : None Difficulty sitting down on and standing up from a chair with arms (e.g., wheelchair, bedside commode, etc,.)?: None Help needed moving to and from a bed to chair (including a wheelchair)?: None Help needed walking in hospital room?: A  Little Help needed climbing 3-5 steps with a railing? : A Little 6 Click Score: 22    End of Session Equipment Utilized During Treatment: Gait belt Activity Tolerance: Patient tolerated treatment well Patient left: in bed;with call bell/phone within reach;with bed alarm set;with family/visitor present Nurse Communication: Mobility status PT Visit Diagnosis: Other abnormalities of gait and mobility (R26.89);Pain Pain - Right/Left: Left Pain - part of body: (chest, incision site)    Time: 4975-3005 PT Time Calculation (min) (ACUTE ONLY): 14 min   Charges:   PT Evaluation $PT Eval Low Complexity: 1 Low     PT G Codes:        [place], PT, [place] 02/10/2018, 2:49 PM

## 2018-02-10 NOTE — Progress Notes (Unsigned)
Lauren Buchanan had her left mastectomy on 02/08/2018.  This shows the tumor to be ER positive at 70%, with strong staining intensity.  It is progesterone receptor negative and he has a proliferation marker of 15%.  HER-2 is pending, but this tumor was HER-2 negative previously  The tumor on the right is HER-2 positive.  She is going to see me mid May and we will take it from there

## 2018-02-10 NOTE — NC FL2 (Signed)
[place] LEVEL OF CARE SCREENING TOOL     IDENTIFICATION  Patient Name: Lauren Buchanan Birthdate: [birth year] Sex: female Admission Date (Current Location): 02/08/2018  [facility] and [place] Number:  Herbalist and Address:  The [place], [place] [address], [place], [place] [address]      Provider Number: [id number]  Attending Physician Name and Address:  Fanny Skates, MD  Relative Name and Phone Number:       Current Level of Care: Hospital Recommended Level of Care: [place] Prior Approval Number:    Date Approved/Denied:   PASRR Number: pending ([place] MUST down 4/25)  Discharge Plan: SNF    Current Diagnoses: Patient Active Problem List   Diagnosis Date Noted  . Stage IV breast cancer in female [facility]) 02/08/2018  . Goals of care, counseling/discussion 12/10/2017  . Decreased thyroid stimulating hormone (TSH) level 08/26/2017  . Hyperglycemia 02/15/2017  . Degenerative tear of left medial meniscus 02/04/2017  . Cancer of left lung ([place]) 10/16/2016  . Malignant neoplasm of overlapping sites of right breast in female, estrogen receptor positive ([place]) 04/28/2016  . Breast cancer metastasized to bone ([place]) 03/20/2015  . Osteoporosis 02/27/2015  . Malignant neoplasm of upper-outer quadrant of left breast in female, estrogen receptor positive ([place]) 12/12/2014  . Hepatic steatosis 12/12/2014  . Diarrhea 08/02/2014  . Giant cell arteritis ([place]) 06/02/2013  . Dehydration with hyponatremia 02/28/2013  . Transaminitis 02/28/2013  . 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 SIRS (systemic inflammatory response syndrome) ([place]) 02/27/2013  . Temporal arteritis ([place]) 02/27/2013  . Blurred vision, bilateral 12/13/2012  . Other malaise and fatigue 12/13/2012  . Bronchiectasis with acute exacerbation ([place]) 03/30/2012  . Bronchiectasis without acute exacerbation ([place]) 01/01/2012  . Memory  loss 08/07/2010  . SVT/ PSVT/ PAT 09/20/2009  . COPD with chronic bronchitis ([place]) 08/21/2009  . PLANTAR FASCIITIS, BILATERAL 12/07/2008  . AV BLOCK, COMPLETE 09/07/2008  . PACEMAKER, PERMANENT 09/07/2008  . RHINITIS, ALLERGIC NEC 05/13/2007  . Goiter 05/12/2007  . IBS 05/12/2007  . DEGENERATIVE JOINT DISEASE 05/12/2007    Orientation RESPIRATION BLADDER Height & Weight     Self, Place  Normal Continent Weight: 126 lb (57.2 kg) Height:  5' (152.4 cm)  BEHAVIORAL SYMPTOMS/MOOD NEUROLOGICAL BOWEL NUTRITION STATUS      Continent Diet(see discharge summary)  AMBULATORY STATUS COMMUNICATION OF NEEDS Skin   Supervision Verbally Surgical wounds(incision left breast, left face; jp drains x2 left lateral and left anterior)                       Personal Care Assistance Level of Assistance  Bathing, Feeding, Dressing Bathing Assistance: Limited assistance Feeding assistance: Independent Dressing Assistance: Limited assistance     Functional Limitations Info  Sight, Hearing, Speech Sight Info: Adequate Hearing Info: Adequate Speech Info: Adequate    SPECIAL CARE FACTORS FREQUENCY  PT (By licensed PT), OT (By licensed OT)     PT Frequency: 5x week OT Frequency: 5x week            Contractures Contractures Info: Not present    Additional Factors Info  Code Status, Allergies Code Status Info: Full Code Allergies Info: AMOXICILLIN-POT CLAVULANATE, MORPHINE AND RELATED            Current Medications (02/10/2018):  This is the current hospital active medication list Current Facility-Administered Medications  Medication Dose Route Frequency Provider Last Rate Last Dose  . acetaminophen (  TYLENOL) tablet 650 mg  650 mg Oral Q6H PRN Fanny Skates, MD      . calcium-vitamin D (OSCAL WITH D) 500-200 MG-UNIT per tablet 1 tablet  1 tablet Oral Daily Fanny Skates, MD   1 tablet at 02/10/18 1020  . diclofenac sodium (VOLTAREN) 1 % transdermal gel 2 g  2 g Topical TID PRN  Fanny Skates, MD      . everolimus Lenon Oms) tablet TABS 2.5 mg  2.5 mg Oral Daily Fanny Skates, MD   2.5 mg at 02/10/18 1036  . exemestane (AROMASIN) tablet 25 mg  25 mg Oral QPC breakfast Fanny Skates, MD   25 mg at 02/10/18 0905  . fentaNYL (SUBLIMAZE) injection 25-50 mcg  25-50 mcg Intravenous Q5 min PRN Audry Pili, MD      . gabapentin (NEURONTIN) capsule 100 mg  100 mg Oral BID Fanny Skates, MD   100 mg at 02/10/18 1020  . heparin injection 5,000 Units  5,000 Units Subcutaneous Q8H Fanny Skates, MD   5,000 Units at 02/10/18 1450  . HYDROcodone-acetaminophen (NORCO/VICODIN) 5-325 MG per tablet 1-2 tablet  1-2 tablet Oral Q4H PRN Fanny Skates, MD      . insulin aspart (novoLOG) injection 0-9 Units  0-9 Units Subcutaneous TID WC Fanny Skates, MD   1 Units at 02/10/18 [phone]  . lactated ringers infusion   Intravenous Continuous Fanny Skates, MD 10 mL/hr at 02/09/18 1430 10 mL/hr at 02/09/18 1430  . lactose free nutrition (Boost) liquid 237 mL  237 mL Oral Daily Fanny Skates, MD   237 mL at 02/10/18 1021  . methocarbamol (ROBAXIN) tablet 500 mg  500 mg Oral Q6H PRN Fanny Skates, MD   500 mg at 02/09/18 2140  . metoprolol succinate (TOPROL-XL) 24 hr tablet 50 mg  50 mg Oral Daily Fanny Skates, MD   50 mg at 02/10/18 1020  . ondansetron (ZOFRAN) injection 4 mg  4 mg Intravenous Once PRN Audry Pili, MD      . ondansetron (ZOFRAN-ODT) disintegrating tablet 4 mg  4 mg Oral Q6H PRN Fanny Skates, MD       Or  . ondansetron [facility]) injection 4 mg  4 mg Intravenous Q6H PRN Fanny Skates, MD   4 mg at 02/09/18 1753  . pantoprazole (PROTONIX) EC tablet 40 mg  40 mg Oral Daily Fanny Skates, MD   40 mg at 02/10/18 1020  . predniSONE (DELTASONE) tablet 5 mg  5 mg Oral Q breakfast Fanny Skates, MD   5 mg at 02/10/18 0905  . senna (SENOKOT) tablet 8.6 mg  1 tablet Oral BID Fanny Skates, MD   8.6 mg at 02/10/18 1020  . sodium chloride flush (NS) 0.9 %  injection 10-40 mL  10-40 mL Intracatheter PRN Fanny Skates, MD      . traMADol [name]) tablet 50 mg  50 mg Oral Q6H PRN Fanny Skates, MD   50 mg at 02/09/18 0304     Discharge Medications: Please see discharge summary for a list of discharge medications.  Relevant Imaging Results:  Relevant Lab Results:   Additional Information SS# [place], [place]

## 2018-02-10 NOTE — Social Work (Addendum)
CSW spoke with pt and pt daughter yesterday (4/25). Aware pt is discharging Friday.   CSW spoke with [facility] yesterday and today, there is still no bed available at the SNF at either [facility]. Yates Decamp, liaison for admissions at [facility] awaits PT evaluation, and is checking in to make sure that they can meet needs for any PT/OT and wound/drain care at the ALF there where pt normally resides.   4:30pm- CSW had spoken with PT, pt does not require SNF level therapies however after speaking to [facility] liaison, pt will be better managed for care at SNF level. Pt daughter states she will not accept SNF at another facility as they are "guaranteed a [facility] bed at [facility]."  Index states there will likely be a bed at [facility] tomorrow, they will f/u with this Probation officer tomorrow in the morning. They are aware pt is discharging tomorrow.    CSW continuing to follow.  [name], [place] Work [phone]

## 2018-02-11 ENCOUNTER — Inpatient Hospital Stay (HOSPITAL_COMMUNITY): Payer: Medicare Other

## 2018-02-11 ENCOUNTER — Inpatient Hospital Stay: Payer: Medicare Other | Admitting: Oncology

## 2018-02-11 DIAGNOSIS — C50911 Malignant neoplasm of unspecified site of right female breast: Secondary | ICD-10-CM | POA: Diagnosis not present

## 2018-02-11 DIAGNOSIS — R4789 Other speech disturbances: Secondary | ICD-10-CM | POA: Diagnosis not present

## 2018-02-11 DIAGNOSIS — Z9012 Acquired absence of left breast and nipple: Secondary | ICD-10-CM | POA: Diagnosis not present

## 2018-02-11 DIAGNOSIS — Z902 Acquired absence of lung [part of]: Secondary | ICD-10-CM | POA: Diagnosis not present

## 2018-02-11 DIAGNOSIS — C50912 Malignant neoplasm of unspecified site of left female breast: Secondary | ICD-10-CM | POA: Diagnosis not present

## 2018-02-11 DIAGNOSIS — R739 Hyperglycemia, unspecified: Secondary | ICD-10-CM | POA: Diagnosis not present

## 2018-02-11 DIAGNOSIS — M1990 Unspecified osteoarthritis, unspecified site: Secondary | ICD-10-CM | POA: Diagnosis not present

## 2018-02-11 DIAGNOSIS — R2681 Unsteadiness on feet: Secondary | ICD-10-CM | POA: Diagnosis not present

## 2018-02-11 DIAGNOSIS — J479 Bronchiectasis, uncomplicated: Secondary | ICD-10-CM | POA: Diagnosis not present

## 2018-02-11 DIAGNOSIS — Z950 Presence of cardiac pacemaker: Secondary | ICD-10-CM | POA: Diagnosis not present

## 2018-02-11 DIAGNOSIS — J3089 Other allergic rhinitis: Secondary | ICD-10-CM | POA: Diagnosis not present

## 2018-02-11 DIAGNOSIS — C50919 Malignant neoplasm of unspecified site of unspecified female breast: Secondary | ICD-10-CM | POA: Diagnosis not present

## 2018-02-11 DIAGNOSIS — R29898 Other symptoms and signs involving the musculoskeletal system: Secondary | ICD-10-CM | POA: Diagnosis not present

## 2018-02-11 DIAGNOSIS — I10 Essential (primary) hypertension: Secondary | ICD-10-CM | POA: Diagnosis not present

## 2018-02-11 DIAGNOSIS — M6281 Muscle weakness (generalized): Secondary | ICD-10-CM | POA: Diagnosis not present

## 2018-02-11 DIAGNOSIS — K589 Irritable bowel syndrome without diarrhea: Secondary | ICD-10-CM | POA: Diagnosis not present

## 2018-02-11 DIAGNOSIS — C50819 Malignant neoplasm of overlapping sites of unspecified female breast: Secondary | ICD-10-CM | POA: Diagnosis not present

## 2018-02-11 DIAGNOSIS — R5381 Other malaise: Secondary | ICD-10-CM | POA: Diagnosis not present

## 2018-02-11 DIAGNOSIS — I442 Atrioventricular block, complete: Secondary | ICD-10-CM | POA: Diagnosis not present

## 2018-02-11 DIAGNOSIS — R413 Other amnesia: Secondary | ICD-10-CM | POA: Diagnosis not present

## 2018-02-11 DIAGNOSIS — M316 Other giant cell arteritis: Secondary | ICD-10-CM | POA: Diagnosis not present

## 2018-02-11 DIAGNOSIS — F0390 Unspecified dementia without behavioral disturbance: Secondary | ICD-10-CM | POA: Diagnosis not present

## 2018-02-11 DIAGNOSIS — J449 Chronic obstructive pulmonary disease, unspecified: Secondary | ICD-10-CM | POA: Diagnosis not present

## 2018-02-11 DIAGNOSIS — D500 Iron deficiency anemia secondary to blood loss (chronic): Secondary | ICD-10-CM | POA: Diagnosis not present

## 2018-02-11 DIAGNOSIS — E049 Nontoxic goiter, unspecified: Secondary | ICD-10-CM | POA: Diagnosis not present

## 2018-02-11 DIAGNOSIS — J309 Allergic rhinitis, unspecified: Secondary | ICD-10-CM | POA: Diagnosis not present

## 2018-02-11 DIAGNOSIS — M19032 Primary osteoarthritis, left wrist: Secondary | ICD-10-CM | POA: Diagnosis not present

## 2018-02-11 LAB — GLUCOSE, CAPILLARY: Glucose-Capillary: 99 mg/dL (ref 65–99)

## 2018-02-11 MED ORDER — HEPARIN SOD (PORK) LOCK FLUSH 100 UNIT/ML IV SOLN
500.0000 [IU] | INTRAVENOUS | Status: AC | PRN
  Administered 2018-02-11: 500 [IU]

## 2018-02-11 NOTE — Clinical Social Work Placement (Signed)
   CLINICAL SOCIAL WORK PLACEMENT  NOTE [facility]   Date:  02/11/2018  Patient Details  Name: Lauren Buchanan MRN: [id number] Date of Birth: October 13, 1925  Clinical Social Work is seeking post-discharge placement for this patient at the [place] level of care (*CSW will initial, date and re-position this form in  chart as items are completed):      Patient/family provided with [place] Work Department's list of facilities offering this level of care within the geographic area requested by the patient (or if unable, by the patient's family).  Yes   Patient/family informed of their freedom to choose among providers that offer the needed level of care, that participate in Medicare, Medicaid or managed care program needed by the patient, have an available bed and are willing to accept the patient.      Patient/family informed of [facility]'s ownership interest in [facility] and [facility], as well as of the fact that they are under no obligation to receive care at these facilities.  PASRR submitted to EDS on       PASRR number received on 02/11/18     Existing PASRR number confirmed on       FL2 transmitted to all facilities in geographic area requested by pt/family on 02/09/18     FL2 transmitted to all facilities within larger geographic area on       Patient informed that his/her managed care company has contracts with or will negotiate with certain facilities, including the following:        Yes   Patient/family informed of bed offers received.  Patient chooses bed at [facility]     Physician recommends and patient chooses bed at      Patient to be transferred to [facility] on 02/11/18.  Patient to be transferred to facility by [facility]     Patient family notified on 02/11/18 of transfer.  Name of family member notified:  daughter at bedside     PHYSICIAN       Additional Comment:     _______________________________________________ [name], [place] 02/11/2018, 11:55 AM

## 2018-02-11 NOTE — Discharge Instructions (Signed)
[facility]___[facility] °[phone] ° °MASTECTOMY: POST OP INSTRUCTIONS ° °Always review your discharge instruction sheet given to you by the facility where your surgery was performed. °IF YOU HAVE DISABILITY OR FAMILY LEAVE FORMS, YOU MUST BRING THEM TO THE OFFICE FOR PROCESSING.   °DO NOT GIVE THEM TO YOUR DOCTOR. °A prescription for pain medication may be given to you upon discharge.  Take your pain medication as prescribed, if needed.  If narcotic pain medicine is not needed, then you may take acetaminophen (Tylenol) or ibuprofen (Advil) as needed. °1. Take your usually prescribed medications unless otherwise directed. °2. If you need a refill on your pain medication, please contact your pharmacy.  They will contact our office to request authorization.  Prescriptions will not be filled after 5pm or on week-ends. °3. You should follow a light diet the first few days after arrival home, such as soup and crackers, etc.  Resume your normal diet the day after surgery. °4. Most patients will experience some swelling and bruising on the chest and underarm.  Ice packs will help.  Swelling and bruising can take several days to resolve.  °5. It is common to experience some constipation if taking pain medication after surgery.  Increasing fluid intake and taking a stool softener (such as Colace) will usually help or prevent this problem from occurring.  A mild laxative (Milk of Magnesia or Miralax) should be taken according to package instructions if there are no bowel movements after 48 hours. °6. Unless discharge instructions indicate otherwise, leave your bandage dry and in place until your next appointment in 3-5 days.  You may take a limited sponge bath.  No tube baths or showers until the drains are removed.  You may have steri-strips (small skin tapes) in place directly over the incision.  These strips should be left on the skin for 7-10 days.  If your surgeon used skin glue on the incision, you may  shower in 24 hours.  The glue will flake off over the next 2-3 weeks.  Any sutures or staples will be removed at the office during your follow-up visit. °7. DRAINS:  If you have drains in place, it is important to keep a list of the amount of drainage produced each day in your drains.  Before leaving the hospital, you should be instructed on drain care.  Call our office if you have any questions about your drains. °8. ACTIVITIES:  You may resume regular (light) daily activities beginning the next day--such as daily self-care, walking, climbing stairs--gradually increasing activities as tolerated.  You may have sexual intercourse when it is comfortable.  Refrain from any heavy lifting or straining until approved by your doctor. °a. You may drive when you are no longer taking prescription pain medication, you can comfortably wear a seatbelt, and you can safely maneuver your car and apply brakes. °b. RETURN TO WORK:  __________________________________________________________ °9. You should see your doctor in the office for a follow-up appointment approximately 3-5 days after your surgery.  Your doctor’s nurse will typically make your follow-up appointment when she calls you with your pathology report.  Expect your pathology report 2-3 business days after your surgery.  You may call to check if you do not hear from us after three days.   °10. OTHER INSTRUCTIONS: ______________________________________________________________________________________________ ____________________________________________________________________________________________ °WHEN TO CALL YOUR DOCTOR: °1. Fever over 101.0 °2. Nausea and/or vomiting °3. Extreme swelling or bruising °4. Continued bleeding from incision. °5. Increased pain, redness, or drainage from the incision. °  The clinic staff is available to answer your questions during regular business hours.  Please dont hesitate to call and ask to speak to one of the nurses for clinical  concerns.  If you have a medical emergency, go to the nearest emergency room or call 911.  A surgeon from [facility] is always on call at the hospital. [address], [place], [place], [place]  [address] ? [address], [place], [place]   [phone] ? [phone] ? FAX [phone]

## 2018-02-11 NOTE — Discharge Summary (Signed)
Patient ID: Lauren Buchanan [id number] y.o. January 22, 1925  Admit date: 02/08/2018  Discharge date and time: 02/12/2016  Admitting Physician: [name]  Discharge Physician: [name]  Admission Diagnoses: LEFT BREAST CANCER                                   Discharge Diagnoses: Bilateral breast cancer                                         Malignant ulceration left breast skin                                         Complete heart block                                         Pacemaker                                          Hypertension                                          Acquired bronchiectasis                                          Giant cell arteritis                                          Family history of breast cancer                                          History of left lung lobectomy                                                                               Memory impairment of gradual onset                                           Postoperative hyperglycemia, resolved  Operations: Procedure(s): TOTAL MASTECTOMY LEFT  Admission Condition: fair  Discharged Condition: fair  Indication for Admission: . This is a 84 year old female who is brought to the operating room electively for palliative left mastectomy. She is referred by Dr. Jana Hakim for consideration of left  palliative mastectomy. Dr. Cira Servant her cardiologist.  In February 2016 she underwent 2 skin biopsies of the right breast skin. One revealed a basal cell carcinoma and the other revealed breast cancer. Subsequently she underwent mammograms. She had 2 core biopsies of 2 separate masses in the left breast and a left axillary lymph node. All of these were invasive lobular carcinoma, receptor positive, HER-2 negative. On Mar 07, 2015 she underwent biopsy of a lesion at T11 which shows metastatic carcinoma. She is therefore stage IV and has been on suppressive chemotherapy  since then.  She had a Port-A-Cath placed several weeks ago. She has more recently noticed a bleeding ulcerating area in the lateral left breast. There are no symptoms related to the right breast. PET scan on October 15, 2017 shows enlarging bilateral breast masses. Stable T11 lesion. Stable left lung lesion although this is thought to be due to previous left thoracotomy for benign disease. She has a pacemaker on the left and a Port-A-Cath on the right She saw Dr. Jana Hakim on January 21, 2018. He noted the left breast had a lesion in the upper outer quadrant measuring about 2 cm on the skin. Not infected but obviously ulcerated tumor. He referred her to me with the thought that we should consider palliative leftmastectomy for local control since she may have a few more years and this would hopefully reduce progressive ulceration, bleeding, odor, pain, and possible fixation to the chest wall. I discussed this at length with the patient and her 2 daughters. She wishes to go ahead with leftmastectomy rather than face the natural history of progressive left breast cancer.. The right breast cancer is stable and asymptomatic. We talked about whether to do anything about this and we all agreed to leave the right side alone since she is unlikely to benefit from right mastectomy, and that would add risk to the surgery event. Comorbidities included complete heart block. Has a pacemaker on the left side. I think we can stay away from this. Bilateral breast cancer with metastasis to T11. Giant cell arteritis takes prednisone 5 mg a day. COPD and bronchiectasis but doesn't require inhalers or oxygen. Port-A-Cath insertion in March 2019 I recently evaluated the patient in the office in the presence of both of her daughters.  Everyone is in agreement to go ahead with left palliative mastectomy to avoid progressive symptoms from her left breast ulceration.    Hospital Course: On  the day of admission the patient was taken to the operating room and underwent a left total mastectomy.  There were a couple of palpable nodes associated with the tail of Sp[place] that were also removed.  The surgery was uneventful without any intraoperative or postoperative complications.    The final pathology shows at least 2 foci of invasive lobular carcinoma and lobular carcinoma in situ, 2.2 cm and 0.7 cm.  1 out of 3 lymph nodes was positive for metastatic cancer with extracapsular extension.  ER 70%.  PR 0.  Ki-67 15%.     The patient was slow to mobilize due to her advanced age and arthritis but ultimately she was able to get up and ambulate in the hall quite well.  She was seen by physical therapy who stated that there was no equipment needs post discharge.  She also had transient hyperglycemia and was placed on sliding scale insulin but this resolved promptly after 24 hours.  She also had transient systolic hypertension but with fluid restriction she was normotensive the last 24  to 36 hours and no intervention was required.      Her daughter stayed with her in the room for the duration of the hospital stay.  Case management, social work, and [facility]   coordinator were involved throughout.  The plan [facility].      FL-2 signed.      Her medications are unchanged.  I wrote no prescriptions since on the day of discharge the patient was alert she can either take Tylenol or tramadol    once discharged.      , Stable, normotensive with good vital signs.  Due to advanced age she was a little bit deconditioned as expected.  Memory impairment seemed to be at baseline.  No agitation or delirium. She was moving her left shoulder around fairly well.  The left mastectomy wound looked good.  Skin flaps viable.  No hematoma or seroma.  Both drains functioning with low volume serosanguineous output.     Lab work on the day of discharge her  capillary blood glucoses were 117, 129, 155.  Hemoglobin 11.2 on April 24.  Creatinine 0.87 on April 24.  Potassium 4.0 on April 24.      She will follow-up with me in my office in 10 to 12 days to check the wound and drainage.  She will follow-up with Dr. Jana Hakim on May 17.        Consults: None  Significant Diagnostic Studies: Surgical pathology.  Blood work.  Treatments: surgery: Left total mastectomy  Disposition: Nursing Home  Patient Instructions:  Allergies as of 02/11/2018      Reactions   Amoxicillin-pot Clavulanate Other (See Comments)   Upset stomach Has patient had a PCN reaction causing immediate rash, facial/tongue/throat swelling, SOB or lightheadedness with hypotension: No Has patient had a PCN reaction causing severe rash involving mucus membranes or skin necrosis: No Has patient had a PCN reaction that required hospitalization: No Has patient had a PCN reaction occurring within the last 10 years: No If all of the above answers are "NO", then may proceed with Cephalosporin use.   Morphine And Related Nausea Only      Medication List    TAKE these medications   CALTRATE 600+D3 PO Take 1 tablet by mouth daily.   diclofenac sodium 1 % Gel Commonly known as:  VOLTAREN Apply 2 g topically 3 (three) times daily as needed (APPLY TO SHOULDER AND LEFT KNEE  FOR PAIN).   everolimus 2.5 MG tablet Commonly known as:  AFINITOR Take 1 tablet (2.5 mg total) by mouth daily. Caution:chemotherapy.   exemestane 25 MG tablet Commonly known as:  AROMASIN Take 1 tablet (25 mg total) by mouth daily after breakfast.   lactose free nutrition Liqd Take 237 mLs daily by mouth.   metoprolol succinate 50 MG 24 hr tablet Commonly known as:  TOPROL-XL Take 50 mg by mouth daily. Take with or immediately following a meal.   predniSONE 5 MG tablet Commonly known as:  DELTASONE Take 5 mg by mouth daily with breakfast. Reported on 11/19/2015   traMADol 50 MG tablet Commonly known  as:  ULTRAM Take 50 mg by mouth every 6 (six) hours as needed (FOR PAIN).   Turmeric 500 MG Tabs Take 500 mg by mouth daily.            Discharge Care Instructions  (From admission, onward)        Start  Ordered   02/11/18 0000  Discharge wound care:    Comments:  Change the bandage with dry gauze and the elastic binder as needed  Keep a written record of the drainage and bring that to the office with you   02/11/18 0652      Activity: As instructed.  Unlimited ambulation.  Frequent range of motion exercises left shoulder. Diet: Diet as tolerated.  No specific restrictions Wound Care: as directed  Follow-up:  With Dr. Dalbert Batman in 10-12 days.  Signed: Edsel Petrin. Dalbert Batman, M.D., FACS General and minimally invasive surgery Breast and Colorectal Surgery  02/11/2018, 6:56 AM

## 2018-02-11 NOTE — Progress Notes (Signed)
Pt back from x-ray for left wrist pain waiting for the results.

## 2018-02-11 NOTE — Progress Notes (Signed)
I spoke to [employer] in [facility] possible discharge in the afternoon, family and charge nurse aware aware.

## 2018-02-11 NOTE — Progress Notes (Signed)
Pt was discharged via stretcher by [facility] going to [facility] in [place] side given all her personal belongings, discontinued peripheral IV, alert and responsive, no s/s of distress noted.

## 2018-02-11 NOTE — Progress Notes (Signed)
Pt had x-ray on her left wrist and I informed Dr. Dalbert Batman about the results and he ordered pt can transfer to nursing home, I also informed her daughter and nursing home for update, [facility] will pick her up in about 1-1.5 hours as per [place] fr [facility].

## 2018-02-11 NOTE — Social Work (Signed)
When x ray complete and pt able to discharge, please call PTAR at [phone]. PTAR paperwork on chart.  Clinical Social Worker facilitated patient discharge including contacting patient family and facility to confirm patient discharge plans.  Clinical information faxed to facility and family agreeable with plan.  RN to arrange ambulance transport via [facility] to [facility].   RN to call [phone] with report  prior to discharge.  Clinical Social Worker will sign off for now as social work intervention is no longer needed. Please consult [place] again if new need arises.  [name], [place] Social Worker

## 2018-02-11 NOTE — Progress Notes (Signed)
Pt complaining of left wrist Dr. Dalbert Batman aware and ordered left wrist x-ray, daughter at the bedside.

## 2018-02-11 NOTE — Care Management Important Message (Signed)
Important Message  Patient Details  Name: Lauren Buchanan MRN: [id number] Date of Birth: [birth year]   Medicare Important Message Given:  Yes    Orbie Pyo 02/11/2018, 1:52 PM

## 2018-02-11 NOTE — Progress Notes (Signed)
I gave report to [place] to [facility], RN tel [phone], still waiting for x-ray to her left wrist, will call [place] [facility] [phone] prior to discharge to set up for transport.

## 2018-02-11 NOTE — Social Work (Signed)
[facility] MUST is back up, pt PASSR is [id number] [id number].   CSW has spoken with admissions liaison from [facility] where they have been able to find a SNF bed for pt, she will discharge later this afternoon via [facility].  [name], [place] Work [phone]

## 2018-02-11 NOTE — Progress Notes (Signed)
Pt for discharge going to [facility] home, health teachings, next appointment, prescription meds explained and understood, called IV team to deaccess the porta cath, wound site dry and intact, no complain of pain, daughter at bedside, still with 2 JP drain instructed how to drain,no s/s of distress noted, given back personal meds from pharmacy.

## 2018-02-14 ENCOUNTER — Telehealth: Payer: Self-pay

## 2018-02-14 ENCOUNTER — Encounter: Payer: Self-pay | Admitting: Internal Medicine

## 2018-02-14 ENCOUNTER — Non-Acute Institutional Stay (SKILLED_NURSING_FACILITY): Payer: Medicare Other | Admitting: Internal Medicine

## 2018-02-14 DIAGNOSIS — R5381 Other malaise: Secondary | ICD-10-CM

## 2018-02-14 DIAGNOSIS — R739 Hyperglycemia, unspecified: Secondary | ICD-10-CM

## 2018-02-14 DIAGNOSIS — C50919 Malignant neoplasm of unspecified site of unspecified female breast: Secondary | ICD-10-CM

## 2018-02-14 DIAGNOSIS — D500 Iron deficiency anemia secondary to blood loss (chronic): Secondary | ICD-10-CM

## 2018-02-14 DIAGNOSIS — M316 Other giant cell arteritis: Secondary | ICD-10-CM | POA: Diagnosis not present

## 2018-02-14 DIAGNOSIS — J449 Chronic obstructive pulmonary disease, unspecified: Secondary | ICD-10-CM

## 2018-02-14 DIAGNOSIS — I442 Atrioventricular block, complete: Secondary | ICD-10-CM

## 2018-02-14 DIAGNOSIS — R413 Other amnesia: Secondary | ICD-10-CM | POA: Diagnosis not present

## 2018-02-14 DIAGNOSIS — Z9012 Acquired absence of left breast and nipple: Secondary | ICD-10-CM | POA: Diagnosis not present

## 2018-02-14 DIAGNOSIS — J309 Allergic rhinitis, unspecified: Secondary | ICD-10-CM | POA: Diagnosis not present

## 2018-02-14 NOTE — Telephone Encounter (Signed)
I have made the 1st attempt to contact the patient or family member in charge, in order to follow up from recently being discharged from the hospital. I left a message on voicemail but I will make another attempt at a different time.  

## 2018-02-14 NOTE — Telephone Encounter (Signed)
I have made the 2nd attempt to contact the patient or family member in charge, in order to follow up from recently being discharged from the hospital. There was no option to leave voicemail but I will make another attempt at a different time.

## 2018-02-14 NOTE — Progress Notes (Signed)
Provider:  Blanchie Serve Buchanan  Location:  [place] Room Number: 50 Place of Service:  SNF (31)  PCP: Dorena Cookey, Buchanan Patient Care Team: Dorena Cookey, Buchanan as PCP - General (Family Medicine) Lauren Buchanan as Consulting Physician (General Surgery) [name], [name], Buchanan as Consulting Physician (Oncology) [name], Buchanan as Consulting Physician (Radiation Oncology) Rockwell Germany, RN as Registered Nurse Mauro Kaufmann, RN as Registered Nurse Gentry Fitz, Buchanan as Consulting Physician (Family Medicine) [name], [name], NP as Nurse Practitioner (Internal Medicine) Thompson Grayer, Buchanan as Consulting Physician (Cardiology)  Extended Emergency Contact Information Primary Emergency Contact: Matthews,[name] Address: [address]          [place], [place] [address] [place] Mobile Phone: [phone] Relation: Daughter Secondary Emergency Contact: Clent Dem[place] Phone: [phone] Relation: Other  Code Status: DNR  Goals of Care: Advanced Directive information Advanced Directives 02/14/2018  Does Patient Have a Medical Advance Directive? Yes  Type of Paramedic of [place];Out of facility DNR (pink MOST or yellow form)  Does patient want to make changes to medical advance directive? No - Patient declined  Copy of [place] in Chart? Yes  Would patient like information on creating a medical advance directive? -  Pre-existing out of facility DNR order (yellow form or pink MOST form) Yellow form placed in chart (order not valid for inpatient use)      Chief Complaint  Patient presents with  . New Admit To SNF    New Admission Visit     HPI: Patient is a 84 y.o. female seen today for admission visit. She was in the hospital from 02/08/18-02/11/18 with left breast cancer and malignant ulceration left breast skin. She underwent palliative left total mastectomy on  02/08/18. The pathology result showed tumor to be ER positive at 70% and progesterone receptor negative with metastatic carcinoma in 1of 3 lymph node with extranodal extension. She has medical history of breast cancer to both breasts with metastases to T11, complete heart block s/p pacemaker, hypertension, giant cell arteritis, history of left lung lobectomy, acquired bronchiactasis among others. She is seen in her room today with her daughter in law and charge nurse present. She denies any concern this would and would like to know when she can get home.   Past Medical History:  Diagnosis Date  . Asthma    "as a child"  . Breast cancer ([place]) 12/08/2014   Bilateral  . Cancer of central portion of female breast ([place]) 12/04/2014  . Cancer of central portion of female breast ([place]) 12/04/2014  . Complete heart block ([place]) 1999   s/p PPM by Dr Olevia Perches, most recent generator change 2009  . Complication of anesthesia   . COPD (chronic obstructive pulmonary disease) ([place])   . Dementia   . Diverticulosis   . DJD (degenerative joint disease)    RA  . Esophageal stricture   . Family history of adverse reaction to anesthesia    Daughter N/V  . GERD (gastroesophageal reflux disease)   . Giant cell arteritis ([place])   . Goiter   . Hemorrhoids   . Hiatal hernia   . IBS (irritable bowel syndrome)   . Paroxysmal atrial fibrillation ([place]) 12/17/2015   asymptomatic, <1%, determined on regular pacemaker interrogation  . Pneumonia   . PONV (postoperative nausea and vomiting)   . S/P radiation therapy 04/24/15 completed  T-11  . Wears glasses    Past Surgical History:  Procedure Laterality Date  . ANAL FISSURE REPAIR    . ARTERY BIOPSY Left 01/03/2013   Procedure: BIOPSY LEFT TEMPORAL ARTERY;  Surgeon: Ascencion Dike, Buchanan;  Location: [facility];  Service: ENT;  Laterality: Left;  . BREAST BIOPSY Right   . CHOLECYSTECTOMY    . COLONOSCOPY    . FRACTURE SURGERY     fx lt wrist  . HEMORRHOID  SURGERY    . INSERT / REPLACE / [place]   Gen change ([facility]) by Dr Olevia Perches 2009  . IR FLUORO GUIDE PORT INSERTION RIGHT  12/22/2017  . IR US GUIDE VASC ACCESS RIGHT  12/22/2017  . LUNG REMOVAL, PARTIAL  1950   left lower lobectomy for brochiectasis   . MASTECTOMY Left 02/08/2018  . MOUTH SURGERY     patient unaware  . RECTAL POLYPECTOMY    . RENAL CYST EXCISION    . TONSILLECTOMY    . TOTAL MASTECTOMY Left 02/08/2018  . TOTAL MASTECTOMY Left 02/08/2018   Procedure: TOTAL MASTECTOMY LEFT;  Surgeon: Lauren Buchanan;  Location: [facility];  Service: General;  Laterality: Left;    reports that she has never smoked. She has never used smokeless tobacco. She reports that she does not drink alcohol or use drugs. Social History   Socioeconomic History  . Marital status: Married    Spouse name: Not on file  . Number of children: 4  . Years of education: Not on file  . Highest education level: Not on file  Occupational History  . Occupation: Retired  Scientific laboratory technician  . Financial resource strain: Not on file  . Food insecurity:    Worry: Not on file    Inability: Not on file  . Transportation needs:    Medical: Not on file    Non-medical: Not on file  Tobacco Use  . Smoking status: Never Smoker  . Smokeless tobacco: Never Used  Substance and Sexual Activity  . Alcohol use: Never    Alcohol/week: 0.0 oz    Frequency: Never    Comment: Occasional   . Drug use: No  . Sexual activity: Not Currently  Lifestyle  . Physical activity:    Days per week: Not on file    Minutes per session: Not on file  . Stress: Not on file  Relationships  . Social connections:    Talks on phone: Not on file    Gets together: Not on file    Attends religious service: Not on file    Active member of club or organization: Not on file    Attends meetings of clubs or organizations: Not on file    Relationship status: Not on file  . Intimate partner violence:    Fear of current or ex partner: Not  on file    Emotionally abused: Not on file    Physically abused: Not on file    Forced sexual activity: Not on file  Other Topics Concern  . Not on file  Social History Narrative   Social History     Marital status: Widowed          Spouse name:                        Years of education:                 Number of children: 4  Occupational History   Occupation: Copywriter, advertising                Retired : Yes                                      Social History Main Topics     Smoking status: Never Smoker                                                                  Smokeless tobacco: Never Used                         Alcohol use:         0.0 oz/week        Comment:      Drug use: No               Sexual activity:        Other Topics            Concern     None on file      Social History Narrative     Do you drink/eat things with caffeine?    Yes     Marital status: Widowed.  What year were you married? 1948     Do you have a living will? Yes     Do you have DNR form? Yes     Do you have signed POA/HPOA forms? Yes             Functional Status Survey:    Family History  Problem Relation Age of Onset  . Heart disease Mother   . Breast cancer Mother   . Stomach cancer Father   . Alcohol abuse Father   . Seizures Son   . Thyroid disease Sister     Health Maintenance  Topic Date Due  . TETANUS/TDAP  07/24/2017  . INFLUENZA VACCINE  05/19/2018  . DEXA SCAN  Completed  . PNA vac Low Risk Adult  Completed    Allergies  Allergen Reactions  . Amoxicillin-Pot Clavulanate Other (See Comments)    Upset stomach Has patient had a PCN reaction causing immediate rash, facial/tongue/throat swelling, SOB or lightheadedness with hypotension: No Has patient had a PCN reaction causing severe rash involving mucus membranes or skin necrosis: No Has patient had a PCN reaction that required hospitalization: No Has patient had a PCN  reaction occurring within the last 10 years: No If all of the above answers are "NO", then may proceed with Cephalosporin use.   Marland Kitchen Morphine And Related Nausea Only    Outpatient Encounter Medications as of 02/14/2018  Medication Sig  . Calcium Carb-Cholecalciferol (CALTRATE 600+D3 PO) Take 1 tablet by mouth daily.  . diclofenac sodium (VOLTAREN) 1 % GEL Apply 2 g topically 3 (three) times daily as needed (APPLY TO SHOULDER AND LEFT KNEE  FOR PAIN).   Marland Kitchen everolimus (AFINITOR) 2.5 MG tablet Take 1 tablet (2.5 mg total) by mouth daily. Caution:chemotherapy.  Marland Kitchen exemestane (AROMASIN) 25 MG tablet Take 1 tablet (  25 mg total) by mouth daily after breakfast.  . lactose free nutrition (BOOST) LIQD Take 237 mLs daily by mouth.  . metoprolol succinate (TOPROL-XL) 50 MG 24 hr tablet Take 50 mg by mouth daily. Take with or immediately following a meal.  . predniSONE (DELTASONE) 5 MG tablet Take 5 mg by mouth daily with breakfast. Reported on 11/19/2015  . traMADol (ULTRAM) 50 MG tablet Take 50 mg by mouth every 6 (six) hours as needed (FOR PAIN).   . Turmeric 500 MG TABS Take 500 mg by mouth daily.  . [DISCONTINUED] loratadine (CLARITIN) 10 MG tablet Take 10 mg by mouth daily.     No facility-administered encounter medications on file as of 02/14/2018.     Review of Systems  Constitutional: Negative for activity change, appetite change, chills and fever.  HENT: Positive for rhinorrhea. Negative for congestion, ear pain, hearing loss, mouth sores, sinus pressure, sore throat and trouble swallowing.   Eyes: Negative for discharge, itching and visual disturbance.  Respiratory: Negative for cough, shortness of breath and wheezing.   Cardiovascular: Negative for chest pain, palpitations and leg swelling.  Gastrointestinal: Negative for abdominal pain, constipation, diarrhea, nausea and vomiting.  Genitourinary: Negative for dysuria, flank pain and frequency.  Musculoskeletal: Negative for arthralgias, back  pain and gait problem.  Skin: Positive for wound. Negative for rash.  Neurological: Negative for dizziness, light-headedness, numbness and headaches.  Psychiatric/Behavioral: Positive for confusion. Negative for behavioral problems, dysphoric mood, sleep disturbance and suicidal ideas. The patient is not nervous/anxious.     Vitals:   02/14/18 1058  BP: 128/75  Pulse: 74  Resp: 20  Temp: 97.7 F (36.5 C)  TempSrc: Oral  SpO2: 94%  Height: 5' (1.524 m)   Body mass index is 24.61 kg/m.   Wt Readings from Last 3 Encounters:  02/08/18 126 lb (57.2 kg)  02/03/18 126 lb 8 oz (57.4 kg)  01/31/18 125 lb (56.7 kg)   Physical Exam  Constitutional: She appears well-developed and well-nourished. No distress.  HENT:  Head: Normocephalic and atraumatic.  Right Ear: External ear normal.  Left Ear: External ear normal.  Nose: Nose normal.  Mouth/Throat: Oropharynx is clear and moist. No oropharyngeal exudate.  Eyes: Pupils are equal, round, and reactive to light. Conjunctivae and EOM are normal. Right eye exhibits no discharge. Left eye exhibits no discharge.  Has corrective glasses  Neck: Normal range of motion. Neck supple. No thyromegaly present.  Cardiovascular: Normal rate and regular rhythm.  Pulmonary/Chest: Effort normal and breath sounds normal. She has no wheezes. She has no rales.  Pacemaker to left chest wall  Abdominal: Soft. Bowel sounds are normal. There is no tenderness. There is no rebound and no guarding.  Musculoskeletal: Normal range of motion. She exhibits no edema.  Able to move all 4 extremities, has surgical incision to left chest wall that is healing well, no drainage or erythema noted, tender to touch. S/p left mastectomy, 2 drains to left lateral chest wall draining serosangineous fluid. Sutures in place.   Lymphadenopathy:    She has no cervical adenopathy.  Neurological: She is alert.  Oriented to person and place but not to time  Skin: Skin is warm and dry.  Capillary refill takes more than 3 seconds. She is not diaphoretic.  Psychiatric: She has a normal mood and affect. Her behavior is normal.    Labs reviewed: Basic Metabolic Panel: Recent Labs    01/07/18 0959 02/03/18 1344 02/08/18 1516 02/09/18 0456  NA 139 135  --  135  K 4.1 4.2  --  4.0  CL 101 101  --  102  CO2 30* 24  --  23  GLUCOSE 136 203*  --  237*  BUN 14 15  --  11  CREATININE 0.81 0.85 0.80 0.87  CALCIUM 10.0 9.3  --  8.5*   Liver Function Tests: Recent Labs    12/24/17 1050 01/07/18 0959 02/03/18 1344  AST 29 35* 45*  ALT 33 33 45  ALKPHOS 86 93 92  BILITOT 1.0 0.7 0.8  PROT 6.3* 6.7 6.3*  ALBUMIN 3.3* 3.3* 3.4*   No results for input(s): LIPASE, AMYLASE in the last 8760 hours. No results for input(s): AMMONIA in the last 8760 hours. CBC: Recent Labs    12/24/17 1050 01/07/18 0959 02/03/18 1344 02/08/18 1516 02/09/18 0456  WBC 7.9 7.1 7.1 6.5 7.7  NEUTROABS 6.1 5.2 6.0  --   --   HGB 12.1 12.6 12.6 12.4 11.2*  HCT 36.6 39.5 39.5 37.8 35.6*  MCV 90.0 91.4 89.4 87.3 87.5  PLT 241 225 225 205 222   Cardiac Enzymes: No results for input(s): CKTOTAL, CKMB, CKMBINDEX, TROPONINI in the last 8760 hours. BNP: Invalid input(s): POCBNP Lab Results  Component Value Date   HGBA1C 6.1 (H) 02/09/2018   Lab Results  Component Value Date   TSH 1.07 08/31/2017   Lab Results  Component Value Date   [id number] 149 02/22/2017   No results found for: FOLATE No results found for: IRON, TIBC, FERRITIN  Imaging and Procedures obtained prior to SNF admission: No results found.  Assessment/Plan  1. Physical deconditioning Will have patient work with PT/OT as tolerated to regain strength and restore function.  Fall precautions are in place.  2. Stage IV breast cancer in female [facility]) S/p left mastectomy this hospital admission. Continue to monitor and empty the drains. Continue dry dressing to surgical incision site. Denies any pain, monitor for pain  and infection. Has follow up with surgery on 10-12 days and oncology on 03/04/18. Continue exemestane and everolimus for now.   3. Giant cell arteritis (HCC) Continue prednisone 5 mg daily and monitor  4. COPD with chronic bronchitis (HCC) Stable, breathing stable this visit. monitor  5. Allergic rhinitis, unspecified seasonality, unspecified trigger Start loratadine 10 mg daily for a week and then daily as needed  6. AV BLOCK, COMPLETE S/p pacemaker. Continue metoprolol succinate 50 mg daily.   7. Memory loss SLP consult for further evaluation, supportive care  8. Hyperglycemia Check bmp, reviewed a1c.   9. Blood loss anemia S/p surgerical blood loss. Monitor cbc  10. S/p left mastectomy Pain free. Monitor incision site and drains for signs of infection. Will need f/u with Dr Dalbert Batman in 10 days. Continue tramadol 50 mg q6h prn pain   Family/ staff Communication: reviewed care plan with patient, her daughter in Sports coach and charge nurse. counselled patient to be evaluated by PT, OT, SLP team to plan a safe discharge. Patient will also need home health services for drain emptying and dressing change based on when the discharge gets planned for. Family understands and agrees with current plan.    Labs/tests ordered: cbc, cmp 02/17/18  Blanchie Serve, Buchanan Internal Medicine [facility] [address] [place], [place] [address] Cell Phone (Monday-Friday 8 am - 5 pm): [phone] On Call: [phone] and follow prompts after 5 pm and on weekends Office Phone: [phone] Office Fax: [phone]

## 2018-02-17 LAB — COMPLETE METABOLIC PANEL WITH GFR
ALT: 32
AST: 37
Albumin: 3.7
Alkaline Phosphatase: 71
BUN: 11 (ref 4–21)
CALCIUM: 9
Creat: 0.81
EGFR (Non-African Amer.): 64
Glucose: 81
POTASSIUM: 4.1
PROTEIN: 5.9
SODIUM: 136
Total Bilirubin: 0.7

## 2018-02-18 ENCOUNTER — Ambulatory Visit: Payer: Medicare Other

## 2018-02-18 ENCOUNTER — Other Ambulatory Visit: Payer: Medicare Other

## 2018-02-18 ENCOUNTER — Encounter: Payer: Self-pay | Admitting: *Deleted

## 2018-02-21 ENCOUNTER — Other Ambulatory Visit: Payer: Self-pay | Admitting: *Deleted

## 2018-02-22 ENCOUNTER — Other Ambulatory Visit: Payer: Self-pay | Admitting: Internal Medicine

## 2018-02-28 ENCOUNTER — Encounter: Payer: Self-pay | Admitting: Family

## 2018-02-28 ENCOUNTER — Non-Acute Institutional Stay (SKILLED_NURSING_FACILITY): Payer: Medicare Other | Admitting: Family

## 2018-02-28 DIAGNOSIS — J309 Allergic rhinitis, unspecified: Secondary | ICD-10-CM

## 2018-02-28 DIAGNOSIS — C50919 Malignant neoplasm of unspecified site of unspecified female breast: Secondary | ICD-10-CM | POA: Diagnosis not present

## 2018-02-28 DIAGNOSIS — I442 Atrioventricular block, complete: Secondary | ICD-10-CM | POA: Diagnosis not present

## 2018-02-28 DIAGNOSIS — J449 Chronic obstructive pulmonary disease, unspecified: Secondary | ICD-10-CM

## 2018-02-28 DIAGNOSIS — J4489 Other specified chronic obstructive pulmonary disease: Secondary | ICD-10-CM

## 2018-02-28 DIAGNOSIS — M316 Other giant cell arteritis: Secondary | ICD-10-CM | POA: Diagnosis not present

## 2018-02-28 NOTE — Progress Notes (Signed)
Location:  [facility] Room Number: 74 Place of Service:  SNF [phone])  Provider: Marlowe Sax FNP-C   PCP: Dorena Cookey, MD Patient Care Team: Dorena Cookey, MD as PCP - General (Family Medicine) Fanny Skates, MD as Consulting Physician (General Surgery) [name], [name], MD as Consulting Physician (Oncology) [name], MD as Consulting Physician (Radiation Oncology) Rockwell Germany, RN as Registered Nurse Mauro Kaufmann, RN as Registered Nurse Gentry Fitz, MD as Consulting Physician (Family Medicine) [name], [name], NP as Nurse Practitioner (Internal Medicine) Thompson Grayer, MD as Consulting Physician (Cardiology)  Extended Emergency Contact Information Primary Emergency Contact: Matthews,[name] Address: [address]          [place], [place] [address] [place] Mobile Phone: [phone] Relation: Daughter Secondary Emergency Contact: Clent Demark [place] Phone: [phone] Relation: Other  Code Status: DNR Goals of care:  Advanced Directive information Advanced Directives 02/28/2018  Does Patient Have a Medical Advance Directive? Yes  Type of Paramedic of [place];Out of facility DNR (pink MOST or yellow form)  Does patient want to make changes to medical advance directive? -  Copy of [place] in Chart? Yes  Would patient like information on creating a medical advance directive? -  Pre-existing out of facility DNR order (yellow form or pink MOST form) Yellow form placed in chart (order not valid for inpatient use)     Allergies  Allergen Reactions  . Amoxicillin-Pot Clavulanate Other (See Comments)    Upset stomach Has patient had a PCN reaction causing immediate rash, facial/tongue/throat swelling, SOB or lightheadedness with hypotension: No Has patient had a PCN reaction causing severe rash involving mucus membranes or skin necrosis: No Has  patient had a PCN reaction that required hospitalization: No Has patient had a PCN reaction occurring within the last 10 years: No If all of the above answers are "NO", then may proceed with Cephalosporin use.   Marland Kitchen Morphine And Related Nausea Only    Chief Complaint  Patient presents with  . Discharge Note    return to [facility]    HPI:  84 y.o. female seen today at [facility] for discharge to [place]. She was here for short term rehabilitation for post hospital admission from 02/08/2018-02/11/2018 for left breast cancer and malignant ulceration of left breast skin.She underwent palliative left total mastectomy on 02/08/2018.she was discharged to [facility] for short term rehabilitation.she has a medical history of bilateral breast cancer with mets to T11,HTN,giant Cell arthritis,COPD,Complete heart block post pacemaker among other conditions.she is seen in her room today.she denies any acute issues or pain.she has had unremarkable stay here in rehab.she has worked well with PT/OT now stable for discharge back to [facility].she ambulates without any assistance or assistive device.Discharge process arranged by facility social worker prior to discharge. She will be discharged with her Prescription medication from the facility then patient to follow up with PCP in 1-2 weeks.she also has a follow up appointment with oncology on 03/04/2018. Facility Nurse report no new concerns.   Past Medical History:  Diagnosis Date  . Asthma    "as a child"  . Breast cancer ([place]) 12/08/2014   Bilateral  . Cancer of central portion of female breast ([place]) 12/04/2014  . Cancer of central portion of female breast ([place]) 12/04/2014  . Complete heart block ([place]) 1999   s/p PPM by  Dr Olevia Perches, most recent generator change 2009  . Complication of anesthesia   . COPD (chronic obstructive pulmonary disease) ([place])   . Dementia   . Diverticulosis   . DJD (degenerative joint  disease)    RA  . Esophageal stricture   . Family history of adverse reaction to anesthesia    Daughter N/V  . GERD (gastroesophageal reflux disease)   . Giant cell arteritis ([place])   . Goiter   . Hemorrhoids   . Hiatal hernia   . IBS (irritable bowel syndrome)   . Paroxysmal atrial fibrillation ([place]) 12/17/2015   asymptomatic, <1%, determined on regular pacemaker interrogation  . Pneumonia   . PONV (postoperative nausea and vomiting)   . S/P radiation therapy 04/24/15 completed   T-11  . Wears glasses     Past Surgical History:  Procedure Laterality Date  . ANAL FISSURE REPAIR    . ARTERY BIOPSY Left 01/03/2013   Procedure: BIOPSY LEFT TEMPORAL ARTERY;  Surgeon: Ascencion Dike, MD;  Location: [facility];  Service: ENT;  Laterality: Left;  . BREAST BIOPSY Right   . CHOLECYSTECTOMY    . COLONOSCOPY    . FRACTURE SURGERY     fx lt wrist  . HEMORRHOID SURGERY    . INSERT / REPLACE / [place]   Gen change ([facility]) by Dr Olevia Perches 2009  . IR FLUORO GUIDE PORT INSERTION RIGHT  12/22/2017  . IR US GUIDE VASC ACCESS RIGHT  12/22/2017  . LUNG REMOVAL, PARTIAL  1950   left lower lobectomy for brochiectasis   . MASTECTOMY Left 02/08/2018  . MOUTH SURGERY     patient unaware  . RECTAL POLYPECTOMY    . RENAL CYST EXCISION    . TONSILLECTOMY    . TOTAL MASTECTOMY Left 02/08/2018  . TOTAL MASTECTOMY Left 02/08/2018   Procedure: TOTAL MASTECTOMY LEFT;  Surgeon: Fanny Skates, MD;  Location: [facility];  Service: General;  Laterality: Left;      reports that she has never smoked. She has never used smokeless tobacco. She reports that she does not drink alcohol or use drugs. Social History   Socioeconomic History  . Marital status: Married    Spouse name: Not on file  . Number of children: 4  . Years of education: Not on file  . Highest education level: Not on file  Occupational History  . Occupation: Retired  Scientific laboratory technician  . Financial resource strain: Not on file  .  Food insecurity:    Worry: Not on file    Inability: Not on file  . Transportation needs:    Medical: Not on file    Non-medical: Not on file  Tobacco Use  . Smoking status: Never Smoker  . Smokeless tobacco: Never Used  Substance and Sexual Activity  . Alcohol use: Never    Alcohol/week: 0.0 oz    Frequency: Never    Comment: Occasional   . Drug use: No  . Sexual activity: Not Currently  Lifestyle  . Physical activity:    Days per week: Not on file    Minutes per session: Not on file  . Stress: Not on file  Relationships  . Social connections:    Talks on phone: Not on file    Gets together: Not on file    Attends religious service: Not on file    Active member of club or organization: Not on file    Attends meetings of clubs or organizations: Not on  file    Relationship status: Not on file  . Intimate partner violence:    Fear of current or ex partner: Not on file    Emotionally abused: Not on file    Physically abused: Not on file    Forced sexual activity: Not on file  Other Topics Concern  . Not on file  Social History Narrative   Social History     Marital status: Widowed          Spouse name:                        Years of education:                 Number of children: 4           Occupational History   Occupation: Copywriter, advertising                Retired : Yes                                      Social History Main Topics     Smoking status: Never Smoker                                                                  Smokeless tobacco: Never Used                         Alcohol use:         0.0 oz/week        Comment:      Drug use: No               Sexual activity:        Other Topics            Concern     None on file      Social History Narrative     Do you drink/eat things with caffeine?    Yes     Marital status: Widowed.  What year were you married? 1948     Do you have a living will? Yes     Do you have DNR  form? Yes     Do you have signed POA/HPOA forms? Yes             Allergies  Allergen Reactions  . Amoxicillin-Pot Clavulanate Other (See Comments)    Upset stomach Has patient had a PCN reaction causing immediate rash, facial/tongue/throat swelling, SOB or lightheadedness with hypotension: No Has patient had a PCN reaction causing severe rash involving mucus membranes or skin necrosis: No Has patient had a PCN reaction that required hospitalization: No Has patient had a PCN reaction occurring within the last 10 years: No If all of the above answers are "NO", then may proceed with Cephalosporin use.   Marland Kitchen Morphine And Related Nausea Only    Pertinent  Health Maintenance Due  Topic Date Due  . INFLUENZA VACCINE  05/19/2018  . DEXA SCAN  Completed  .  PNA vac Low Risk Adult  Completed    Medications: Outpatient Encounter Medications as of 02/28/2018  Medication Sig  . Calcium Carb-Cholecalciferol (CALTRATE 600+D3 PO) Take 1 tablet by mouth daily.  . diclofenac sodium (VOLTAREN) 1 % GEL Apply 2 g topically 3 (three) times daily as needed (APPLY TO SHOULDER AND LEFT KNEE  FOR PAIN).   Marland Kitchen everolimus (AFINITOR) 2.5 MG tablet Take 1 tablet (2.5 mg total) by mouth daily. Caution:chemotherapy.  Marland Kitchen exemestane (AROMASIN) 25 MG tablet Take 1 tablet (25 mg total) by mouth daily after breakfast.  . lactose free nutrition (BOOST) LIQD Take 237 mLs daily by mouth.  . loratadine (CLARITIN) 10 MG tablet Take 10 mg by mouth daily as needed for allergies.  . metoprolol succinate (TOPROL-XL) 50 MG 24 hr tablet Take 50 mg by mouth daily. Take with or immediately following a meal.  . predniSONE (DELTASONE) 5 MG tablet Take 5 mg by mouth daily with breakfast. Reported on 11/19/2015  . traMADol (ULTRAM) 50 MG tablet Take 50 mg by mouth every 6 (six) hours as needed (FOR PAIN).   . Turmeric 500 MG TABS Take 500 mg by mouth daily.   No facility-administered encounter medications on file as of 02/28/2018.     Review of Systems  Constitutional: Negative for appetite change, chills, fatigue and fever.  HENT: Negative for congestion, rhinorrhea, sinus pressure, sinus pain, sneezing, sore throat and trouble swallowing.   Eyes: Positive for visual disturbance. Negative for discharge, redness and itching.       Wears eye glasses   Respiratory: Negative for cough, chest tightness, shortness of breath and wheezing.   Cardiovascular: Negative for chest pain, palpitations and leg swelling.  Gastrointestinal: Negative for abdominal distention, abdominal pain, constipation, diarrhea, nausea and vomiting.  Endocrine: Negative for cold intolerance, heat intolerance, polydipsia, polyphagia and polyuria.  Genitourinary: Negative for dysuria, flank pain, frequency and urgency.  Musculoskeletal: Negative for back pain and gait problem.  Skin: Negative for color change, pallor and rash.       Left chest surgical incision   Neurological: Negative for dizziness, weakness, light-headedness and headaches.  Hematological: Does not bruise/bleed easily.  Psychiatric/Behavioral: Negative for agitation, confusion and sleep disturbance. The patient is not nervous/anxious.     Vitals:   02/28/18 1047  BP: (!) 144/80  Pulse: 74  Resp: 16  Temp: 97.6 F (36.4 C)  SpO2: 96%  Weight: 120 lb 8 oz (54.7 kg)  Height: 5' (1.524 m)   Body mass index is 23.53 kg/m. Physical Exam  Constitutional: She appears well-developed and well-nourished.  Elderly in no acute distress   HENT:  Head: Normocephalic.  Right Ear: External ear normal.  Left Ear: External ear normal.  Mouth/Throat: Oropharynx is clear and moist. No oropharyngeal exudate.  Eyes: Pupils are equal, round, and reactive to light. Conjunctivae and EOM are normal. Right eye exhibits no discharge. Left eye exhibits no discharge. No scleral icterus.  Neck: Normal range of motion. No JVD present. No thyromegaly present.  Cardiovascular: Intact distal pulses.  Exam reveals no gallop and no friction rub.  Left pace marker   Respiratory: Effort normal and breath sounds normal. No respiratory distress. She has no wheezes. She has no rales. She exhibits no tenderness.  GI: Soft. Bowel sounds are normal. She exhibits no distension and no mass. There is no tenderness. There is no rebound and no guarding.  Genitourinary:  Genitourinary Comments: Continent   Musculoskeletal: Normal range of motion. She exhibits no edema or  tenderness.  Gait steady.  Lymphadenopathy:    She has no cervical adenopathy.  Neurological: Coordination normal.  Alert and oriented to person and place.  Skin: Skin is warm and dry. No rash noted. No erythema. No pallor.  Left chest surgical incision healed.No swelling,redness,tenderness or drainage noted.Previous surgical drain site healed.   Psychiatric: She has a normal mood and affect. Her behavior is normal. Thought content normal.   Labs reviewed: Basic Metabolic Panel: Recent Labs    01/07/18 0959 02/03/18 1344 02/08/18 1516 02/09/18 0456 02/17/18  NA 139 135  --  135 136  K 4.1 4.2  --  4.0 4.1  CL 101 101  --  102  --   CO2 30* 24  --  23  --   GLUCOSE 136 203*  --  237*  --   BUN 14 15  --  11 11  CREATININE 0.81 0.85 0.80 0.87 0.81  CALCIUM 10.0 9.3  --  8.5* 9.0   Liver Function Tests: Recent Labs    12/24/17 1050 01/07/18 0959 02/03/18 1344 02/17/18  AST 29 35* 45* 37  ALT 33 33 45 32  ALKPHOS 86 93 92 71  BILITOT 1.0 0.7 0.8 0.7  PROT 6.3* 6.7 6.3*  --   ALBUMIN 3.3* 3.3* 3.4* 3.7   CBC: Recent Labs    12/24/17 1050 01/07/18 0959 02/03/18 1344 02/08/18 1516 02/09/18 0456  WBC 7.9 7.1 7.1 6.5 7.7  NEUTROABS 6.1 5.2 6.0  --   --   HGB 12.1 12.6 12.6 12.4 11.2*  HCT 36.6 39.5 39.5 37.8 35.6*  MCV 90.0 91.4 89.4 87.3 87.5  PLT 241 225 225 205 222   CBG: Recent Labs    02/10/18 1107 02/10/18 1651 02/11/18 0814  GLUCAP 117* 155* 99    Procedures and Imaging Studies During Stay: Dg  Wrist 2 Views Left  Result Date: 02/11/2018 CLINICAL DATA:  Left wrist pain.  No known injury. EXAM: LEFT WRIST - 2 VIEW COMPARISON:  02/11/2011. FINDINGS: Plate and screw fixation left wrist. Hardware intact. Anatomic alignment. Diffuse osteopenia and degenerative change. Degenerative changes most prominent about the intercarpal joint space between the navicular and the trapezium and about the first carpometacarpal joint. IMPRESSION: Plate and screw fixation left wrist. Anatomic alignment. No acute abnormality. Diffuse osteopenia degenerative change as above. Electronically Signed   By: Marcello Moores  Register   On: 02/11/2018 13:51   Assessment/Plan:    1. Stage IV breast cancer in female  Status post left breast mastectomy 02/08/2018.Incision healed.afebrile.No signs or symptoms of infections.Denies any pain.continue on Aromasin 25 mg tablet and Afinitor 2.5 mg tablet daily.Follow up with Oncology as directed on 03/04/2018.follow with PCP in 1-2 weeks.CBC,CMP with PCP or with oncologist.   2. [place] S/p pace maker.continue on metoprolol succinate 50 mg tablet daily.  3. COPD with chronic bronchitis Breathing stable.Exam findings negative for cough or wheezes.continue to monitor.   4. Giant cell arteritis Continue on prednisone 5 mg tablet daily.   5. Allergic rhinitis Asymptomatic.continue on loratadine 10 mg tablet as needed.  Patient is being discharged with the following health services:   -None indicated.patient's gait steady.   Patient is being discharged with the following durable medical equipment:   - None indicated patient walks without any assistive device.  Patient has been advised to f/u with their PCP in 1-2 weeks to for a transitions of care visit.Social services at their facility was responsible for arranging discharge process.Pt was discharge with her medication  from the skilled [facility] provided with adequate prescriptions of noncontrolled medications to  reach the scheduled appointment.For controlled substances, a limited supply was provided as appropriate for the individual patient. If the pt normally receives these medications from a pain clinic or has a contract with another physician, these medications should be received from that clinic or physician only).    Future labs/tests needed:  CBC, BMP in 1-2 weeks PCP  Follow up with Oncologist Dr. Lurline Del on 03/04/2018.

## 2018-03-01 NOTE — Progress Notes (Signed)
Lauren Buchanan  [phone] [phone]   ID: Lauren Buchanan DOB: [birth year]  MR#: [id number]  BTD#:[id number]  Patient Care Team: Dorena Cookey, MD as PCP - General (Family Medicine) Fanny Skates, MD as Consulting Physician (General Surgery) Silvino Selman, [name], MD as Consulting Physician (Oncology) [name], MD as Consulting Physician (Radiation Oncology) Rockwell Germany, RN as Registered Nurse Mauro Kaufmann, RN as Registered Nurse Gentry Fitz, MD as Consulting Physician (Family Medicine) [name], [name], NP as Nurse Practitioner (Internal Medicine) Thompson Grayer, MD as Consulting Physician (Cardiology) PCP: Dorena Cookey, MD GYN: OTHER MD: Danton Sewer MD, Thompson Grayer MD, Unice Bailey MD, Rolm Bookbinder MD  CHIEF COMPLAINT: bilateral estrogen receptor positive breast cancer, right-sided HER-2 positive breast cancer,  bone metastases  CURRENT TREATMENT: zolendronate, exemestane, everolimus, trastuzumab  NOTE: Patient requests her daughter Lauren Buchanan be contacted regarding all appointments and procedures. Lauren Buchanan can be reached at [phone]  INTERVAL HISTORY: Lauren Buchanan returns today for follow-up and treatment of her bilateral estrogen receptor positive breast cancer (and right-sided HER-2 positive breast cancer) accompanied by her daughter, Lauren Buchanan. She continues on exemestane and low dose everolimus, with good tolerance. She had decreased energy. She denies hot flashes, cough, or oral sores.  She also receives trastuzumab every 2 weeks, with a dose due today.  Her most recent echocardiogram on 01/31/2018 showed an ejection fraction in the 55-60% range. She tolerates this well.   Finally, she also receives zolendronate, every 12 weeks, her most recent treatment being 12/10/2017, with a dose due today. She also tolerates this well.   Since her last visit she underwent a left total mastectomy ([id number]) on 02/08/2018 showing invasive and in situ  lobular carcinoma, grade II with  two foci, 2.2 cm and 0.7 cm. Margin not involved. Invasive carcinoma is 0.5 cm from the posterior margin. Metastatic carcinoma in 1 out of 3 left axillary lymph nodes with extranodal extension. A repeat prognostic panel showed estrogen receptor 70% positive with strong staining intensity. Progesterone receptor 0% negative. Ki67 of 15%. HER2 not amplified with ratios HER2/CEP17 signals 1.14 and average HER2 copies per cell 2.50    REVIEW OF SYSTEMS: Lauren Buchanan reports that she had no complications with surgery. She denies any pain, bleeding, or fever. She had drain removed on 02/23/2018. She denies unusual headaches, visual changes, nausea, vomiting, or dizziness. There has been no unusual cough, phlegm production, or pleurisy. This been no change in bowel or bladder habits. She denies unexplained fatigue or unexplained weight loss, or rash.  A detailed review of systems was otherwise stable.    BREAST CANCER HISTORY: From the original intake note:  "Lauren Buchanan"  Has been aware of a sore right nipple for the last 6-8 months. She attributed it to her washing machine, which rubs that area when she uses it. On  11/21/2014 she saw Dr. 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 She mentioned the chest wall problem and Dr. Ubaldo Glassing obtained to skin biopsies 1 from the right inferior central chest and the other one of from the right mid medial chest.  The second of these showed a nodular basal cell carcinoma. The first one however showed invasive carcinoma consistent with a breast primary. This tumor was estrogen receptor positive at 100%, progesterone receptor positive at 36%, both with strong staining intensity, and HER-2 was amplified , the signals ratio being 6.2. [name] [id number])  On 12/06/2014 the patient underwent bilateral diagnostic mammography with bilateral  ultrasonography. Breast density was category B. There was bilateral nipple retraction. In the left  breast upper outer quadrant there was a spiculated mass noted by mammography with a second spiculated mass in the lower inner quadrant. By ultrasound there was a 1.6 cm hypoechoic lesion at the 1:30 o'clock position in the left breast, and a second mass more medially measuring 0.9 cm. Also at the 8:00 position in the left breast 2 cm from the nipple there was a 1.3 cm mass. Ultrasound of the left axilla showed an enlarged lymph node with fatty hilum replacement, measuring 1.4 cm.  In the right breast mammography showed a cluster of indeterminate microcalcifications in the upper outer quadrant and a retroareolar mass, which bile does sound measured 2.9 cm. Ultrasound of the right axilla showed a right axilla lymph node measuring 1.4 cm.  On 12/06/2014 the patient underwent needle core biopsy of 2 separate masses in the left breast (at 1:00 and 8:00) as well as the enlarged axillary lymph node. All 3 were positive for an invasive lobular carcinoma (E-cadherin negative). All 3 tumors were estrogen receptor positive with strong staining intensity (between 87% and 100%). The lymph node was progesterone receptor negative, the other 2 masses being progesterone receptor positive at 9% and at 76%. This cancer was reported to be HER-2 positive at conference today, though I do not have the ratios at hand.  The patient is not an MRI candidate because she has a permanent pacemaker in place. Her subsequent history is as detailed below    PAST MEDICAL HISTORY: Past Medical History:  Diagnosis Date  . Asthma    "as a child"  . Breast cancer ([facility]) 12/08/2014   Bilateral  . Cancer of central portion of female breast ([facility]) 12/04/2014  . Cancer of central portion of female breast ([facility]) 12/04/2014  . Complete heart block ([facility]) 1999   s/p PPM by Dr Olevia Perches, most recent generator change 2009  . Complication of anesthesia   . COPD (chronic obstructive pulmonary disease) ([facility])   . Dementia   . Diverticulosis   . DJD  (degenerative joint disease)    RA  . Esophageal stricture   . Family history of adverse reaction to anesthesia    Daughter N/V  . GERD (gastroesophageal reflux disease)   . Giant cell arteritis ([facility])   . Goiter   . Hemorrhoids   . Hiatal hernia   . IBS (irritable bowel syndrome)   . Paroxysmal atrial fibrillation ([facility]) 12/17/2015   asymptomatic, <1%, determined on regular pacemaker interrogation  . Pneumonia   . PONV (postoperative nausea and vomiting)   . S/P radiation therapy 04/24/15 completed   T-11  . Wears glasses     PAST SURGICAL HISTORY: Past Surgical History:  Procedure Laterality Date  . ANAL FISSURE REPAIR    . ARTERY BIOPSY Left 01/03/2013   Procedure: BIOPSY LEFT TEMPORAL ARTERY;  Surgeon: Ascencion Dike, MD;  Location: [facility];  Service: ENT;  Laterality: Left;  . BREAST BIOPSY Right   . CHOLECYSTECTOMY    . COLONOSCOPY    . FRACTURE SURGERY     fx lt wrist  . HEMORRHOID SURGERY    . INSERT / REPLACE / [facility]   Gen change ([facility]) by Dr Olevia Perches 2009  . IR FLUORO GUIDE PORT INSERTION RIGHT  12/22/2017  . IR US GUIDE VASC ACCESS RIGHT  12/22/2017  . LUNG REMOVAL, PARTIAL  1950   left lower lobectomy for brochiectasis   .  MASTECTOMY Left 02/08/2018  . MOUTH SURGERY     patient unaware  . RECTAL POLYPECTOMY    . RENAL CYST EXCISION    . TONSILLECTOMY    . TOTAL MASTECTOMY Left 02/08/2018  . TOTAL MASTECTOMY Left 02/08/2018   Procedure: TOTAL MASTECTOMY LEFT;  Surgeon: Fanny Skates, MD;  Location: [facility];  Service: General;  Laterality: Left;    FAMILY HISTORY Family History  Problem Relation Age of Onset  . Heart disease Mother   . Breast cancer Mother   . Stomach cancer Father   . Alcohol abuse Father   . Seizures Son   . Thyroid disease Sister    the patient's father died at the age of 20, with stomach cancer. The patient's mother died at the age of 62 from a myocardial infarction. She had been diagnosed with breast cancer in  her 40s. The patient had no brothers, 2 sisters. There is no other history of breast or ovarian cancer in the family to the patient's knowledge  GYNECOLOGIC HISTORY:  No LMP recorded. Patient is postmenopausal. Menarche age 48, first live birth age 7. The patient is GX P4. She underwent menopause in her late 23s. She did not take hormone replacement.  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 She is a widow, lives by herself, with no pets. Daughter Lauren Buchanan lives in [place] where she is Mudlogger of a preschool and day care. Son Lauren Buchanan lives in [place] where he owns a furniture to sign store. Daughter Lauren Buchanan lives in [place]. She owns a Chiropractor. Son Lauren Buchanan is physically and mentally disabled and lives in a group home in [place]. The patient has 7 grandchildren and 2 great-grandchildren. She attends a local [place]: In place; the patient does not wish to be resuscitated in case of a terminal event; the patient's daughter Lauren Buchanan is her healthcare power of attorney. She can be reached at [phone]. The patient has a DO NOT RESUSCITATE order in place at [facility].  HEALTH MAINTENANCE: Social History   Tobacco Use  . Smoking status: Never Smoker  . Smokeless tobacco: Never Used  Substance Use Topics  . Alcohol use: Never    Alcohol/week: 0.0 oz    Frequency: Never    Comment: Occasional   . Drug use: No     Colonoscopy:  PAP:  Bone density:02/19/2015, osteoporosis  Lipid panel:  Allergies  Allergen Reactions  . Amoxicillin-Pot Clavulanate Other (See Comments)    Upset stomach Has patient had a PCN reaction causing immediate rash, facial/tongue/throat swelling, SOB or lightheadedness with hypotension: No Has patient had a PCN reaction causing severe rash involving mucus membranes or skin necrosis: No Has patient had a PCN reaction that required hospitalization: No Has patient had a PCN  reaction occurring within the last 10 years: No If all of the above answers are "NO", then may proceed with Cephalosporin use.   Marland Kitchen Morphine And Related Nausea Only    Current Outpatient Medications  Medication Sig Dispense Refill  . Calcium Carb-Cholecalciferol (CALTRATE 600+D3 PO) Take 1 tablet by mouth daily.    . diclofenac sodium (VOLTAREN) 1 % GEL Apply 2 g topically 3 (three) times daily as needed (APPLY TO SHOULDER AND LEFT KNEE  FOR PAIN).     Marland Kitchen everolimus (AFINITOR) 2.5 MG tablet Take 1 tablet (2.5 mg total) by mouth daily. Caution:chemotherapy. 30 tablet 12  . exemestane (AROMASIN) 25  MG tablet Take 1 tablet (25 mg total) by mouth daily after breakfast. 90 tablet 4  . lactose free nutrition (BOOST) LIQD Take 237 mLs daily by mouth.    . loratadine (CLARITIN) 10 MG tablet Take 10 mg by mouth daily as needed for allergies.    . metoprolol succinate (TOPROL-XL) 50 MG 24 hr tablet Take 50 mg by mouth daily. Take with or immediately following a meal.    . predniSONE (DELTASONE) 5 MG tablet Take 5 mg by mouth daily with breakfast. Reported on 11/19/2015    . traMADol (ULTRAM) 50 MG tablet Take 50 mg by mouth every 6 (six) hours as needed (FOR PAIN).     . Turmeric 500 MG TABS Take 500 mg by mouth daily.     No current facility-administered medications for this visit.     OBJECTIVE: Elderly white woman who appears stated age  53:   03/04/18 1051  BP: (!) 162/70  Pulse: 77  Resp: 18  Temp: 98.3 F (36.8 C)  SpO2: 97%     Body mass index is 24.22 kg/m.    ECOG FS:1 - Symptomatic but completely ambulatory  Sclerae unicteric, EOMs intact No cervical or supraclavicular adenopathy Lungs no rales or rhonchi Heart regular rate and rhythm Abd soft, nontender, positive bowel sounds MSK no focal spinal tenderness, no upper extremity lymphedema Neuro: nonfocal, appropriate affect Breasts: The right breast is unchanged.  The left breast is status post mastectomy.  The incision is  healing nicely, without dehiscence, swelling, or erythema.   Left Breast 12/10/2017     LAB RESULTS:  CMP     Component Value Date/Time   NA 138 03/04/2018 1034   NA 136 02/17/2018   NA 136 09/29/2017 1435   K 3.5 03/04/2018 1034   K 4.1 02/17/2018   K 4.1 09/29/2017 1435   CL 101 03/04/2018 1034   CO2 28 03/04/2018 1034   CO2 27 09/29/2017 1435   GLUCOSE 103 03/04/2018 1034   GLUCOSE 148 (H) 09/29/2017 1435   BUN 9 03/04/2018 1034   BUN 11 02/17/2018   BUN 14.9 09/29/2017 1435   CREATININE 0.93 03/04/2018 1034   CREATININE 0.81 02/17/2018   CREATININE 1.0 09/29/2017 1435   CALCIUM 9.8 03/04/2018 1034   CALCIUM 9.0 02/17/2018   CALCIUM 9.3 09/29/2017 1435   PROT 6.6 03/04/2018 1034   PROT 7.0 09/29/2017 1435   ALBUMIN 3.3 (L) 03/04/2018 1034   ALBUMIN 3.7 02/17/2018   ALBUMIN 3.6 09/29/2017 1435   AST 37 (H) 03/04/2018 1034   AST 37 02/17/2018   AST 28 09/29/2017 1435   ALT 35 03/04/2018 1034   ALT 32 02/17/2018   ALT 22 09/29/2017 1435   ALKPHOS 92 03/04/2018 1034   ALKPHOS 71 02/17/2018   ALKPHOS 89 09/29/2017 1435   BILITOT 0.6 03/04/2018 1034   BILITOT 0.7 02/17/2018   BILITOT 1.12 09/29/2017 1435   GFRNONAA 53 (L) 03/04/2018 1034   GFRNONAA 64 02/17/2018   GFRAA >60 03/04/2018 1034    INo results found for: SPEP, UPEP  Lab Results  Component Value Date   WBC 5.8 03/04/2018   NEUTROABS 4.0 03/04/2018   HGB 12.1 03/04/2018   HCT 37.1 03/04/2018   MCV 85.9 03/04/2018   PLT 198 03/04/2018      Chemistry      Component Value Date/Time   NA 138 03/04/2018 1034   NA 136 02/17/2018   NA 136 09/29/2017 1435   K 3.5 03/04/2018 1034  K 4.1 02/17/2018   K 4.1 09/29/2017 1435   CL 101 03/04/2018 1034   CO2 28 03/04/2018 1034   CO2 27 09/29/2017 1435   BUN 9 03/04/2018 1034   BUN 11 02/17/2018   BUN 14.9 09/29/2017 1435   CREATININE 0.93 03/04/2018 1034   CREATININE 0.81 02/17/2018   CREATININE 1.0 09/29/2017 1435   GLU 94 02/23/2017        Component Value Date/Time   CALCIUM 9.8 03/04/2018 1034   CALCIUM 9.0 02/17/2018   CALCIUM 9.3 09/29/2017 1435   ALKPHOS 92 03/04/2018 1034   ALKPHOS 71 02/17/2018   ALKPHOS 89 09/29/2017 1435   AST 37 (H) 03/04/2018 1034   AST 37 02/17/2018   AST 28 09/29/2017 1435   ALT 35 03/04/2018 1034   ALT 32 02/17/2018   ALT 22 09/29/2017 1435   BILITOT 0.6 03/04/2018 1034   BILITOT 0.7 02/17/2018   BILITOT 1.12 09/29/2017 1435       Lab Results  Component Value Date   LABCA2 20 04/01/2016    No components found for: [id number]  No results for input(s): INR in the last 168 hours.  Urinalysis    Component Value Date/Time   COLORURINE COLORLESS (A) 11/29/2016 1707   APPEARANCEUR CLEAR 11/29/2016 1707   LABSPEC 1.002 (L) 11/29/2016 1707   PHURINE 7.0 11/29/2016 1707   GLUCOSEU NEGATIVE 11/29/2016 1707   HGBUR SMALL (A) 11/29/2016 1707   HGBUR trace-intact 01/17/2010 0932   BILIRUBINUR NEGATIVE 11/29/2016 1707   BILIRUBINUR n 09/01/2011 1157   KETONESUR NEGATIVE 11/29/2016 1707   PROTEINUR 5.9 02/17/2018   PROTEINUR NEGATIVE 11/29/2016 1707   UROBILINOGEN 0.2 09/27/2014 1800   NITRITE NEGATIVE 11/29/2016 1707   LEUKOCYTESUR NEGATIVE 11/29/2016 1707    STUDIES: Dg Wrist 2 Views Left  Result Date: 02/11/2018 CLINICAL DATA:  Left wrist pain.  No known injury. EXAM: LEFT WRIST - 2 VIEW COMPARISON:  02/11/2011. FINDINGS: Plate and screw fixation left wrist. Hardware intact. Anatomic alignment. Diffuse osteopenia and degenerative change. Degenerative changes most prominent about the intercarpal joint space between the navicular and the trapezium and about the first carpometacarpal joint. IMPRESSION: Plate and screw fixation left wrist. Anatomic alignment. No acute abnormality. Diffuse osteopenia degenerative change as above. Electronically Signed   By: Marcello Moores  Register   On: 02/11/2018 13:51    ASSESSMENT: 84 y.o. [place] woman with bilateral breast cancer, as follows:  (1)  status post right breast overlapping sites skin biopsy 11/21/2014 for a clinical T4 N1, stage IIIB invasive ductal carcinoma, estrogen and progesterone receptor positive, HER-2 amplified  (2) status post 2 separate left breast upper outer quadrant biopsies and one left axillary lymph node biopsy 12/04/2014, all positive for a clinical mT1c N1, stage IIA invasive lobular carcinoma, estrogen receptor positive, progesterone receptor variable, with MIB-1 between 10 and 40%, and no HER-2 amplification  (3) neoadjuvant anastrozole started 12/14/2014. Discontinued with progression by repeat left breast US May 2017  (4) definitive surgery was to consist of bilateral mastectomies without formal axillary dissection: Postponed  METASTATIC DISEASE: March 2016 (note: R breast CA is HER-2 positive, L is negative) (5) PET scan 12/19/2014 shows in addition to the bilateral breast and bilateral axillary masses, a lesion at the T11 vertebral body, showing an irregular lucency on prior CT exam  (a) adjuvant radiationt to T10-T12: 04/11/2015 through 04/24/2015 to a total dose of 30 gray in 10 fractions  (6) initially postponed anti-HER-2 treatment depending on response to antiestrogen therapy alone.   (7)  osteoporosis with T score of -2.6 on bone scan obtained 02/19/2015.  (a) zolendronate started 03/20/2015, repeated every 12 weeks   (8) fulvestrant started 03/04/16--discontinued January 2019 with progression  (a) patient refused adding palbociclib  (b) PET scan in 10/15/2016 showed mild increase in the right breast mass and increased size and activity of 3.2 cm of solid left lung nodule  (c) letrozole added 10/16/2016--discontinued January 2019  (d) PET scan obtained 02/10/2017 showing a significant decrease in SUV in the measurable disease  (e) PET scan 10/15/2017 showing increase in bilateral breast and right axillary adenopathy; left lung lesion is stable  (9) Doppler ultrasound both lower extremities  11/17/2016 and repeat 11/29/2016 are  Negative  (10) started trastuzumab 11/12/2017, repeated every 2 weeks  (a) echocardiogram 01/31/2018 showed an ejection fraction in the 55-60% range  (11) started capecitabine 11/15/2017, to be received 1 week on 1 week off  (a) initial dose will be 1000 mg twice daily  (b) dose reduced on 11/29/2017 to 1000 mg daily due to questionable tolerance issues  (c) capecitabine discontinued 12/10/2017 with intolerance (diarrhea)  (12) exemestane and everolimus at 2.5 mg daily started 12/10/2017;   (13) status post left mastectomy 02/08/2018 for mpT2 pNX invasive lobular breast cancer, grade 2, with negative margins.  Prognostic panel not repeated  PLAN: Lauren Buchanan did remarkably well with her surgery.  Left-sided residual disease if any will be treated with the everolimus/exemestane.  On the right side of course we are continuing not only that combination but also trastuzumab.  She will receive a dose today and I am moving her trastuzumab treatments to every 4 weeks at this point.  She will need a repeat echocardiogram sometime in the next couple of months.  I have also set her up for a repeat ultrasound of the right breast to give [place] a new baseline.  I am delighted that she is tolerating the everolimus in particular with no side effects that she is aware of.  I did urge her not to drive until she sees me again, which will be in July.  She did not like that but she seems to agree.  She knows to call for any other issues that may develop before the next visit.  Tihanna Goodson, [name], MD  03/04/18 11:19 AM Medical Oncology and Hematology [name] [address] [place], [place] [address] Tel. [phone]    Fax. [phone]  This document serves as a record of services personally performed by Lurline Del, MD. It was created on his behalf by Sheron Nightingale, a trained medical scribe. The creation of this record is based on the scribe's  personal observations and the provider's statements to them.   I have reviewed the above documentation for accuracy and completeness, and I agree with the above.

## 2018-03-04 ENCOUNTER — Inpatient Hospital Stay: Payer: Medicare Other | Attending: Oncology

## 2018-03-04 ENCOUNTER — Telehealth: Payer: Self-pay | Admitting: Oncology

## 2018-03-04 ENCOUNTER — Other Ambulatory Visit: Payer: Self-pay | Admitting: Oncology

## 2018-03-04 ENCOUNTER — Inpatient Hospital Stay (HOSPITAL_BASED_OUTPATIENT_CLINIC_OR_DEPARTMENT_OTHER): Payer: Medicare Other | Admitting: Oncology

## 2018-03-04 ENCOUNTER — Inpatient Hospital Stay: Payer: Medicare Other

## 2018-03-04 VITALS — BP 162/70 | HR 77 | Temp 98.3°F | Resp 18 | Ht 60.0 in | Wt 124.0 lb

## 2018-03-04 DIAGNOSIS — C50411 Malignant neoplasm of upper-outer quadrant of right female breast: Secondary | ICD-10-CM

## 2018-03-04 DIAGNOSIS — Z9012 Acquired absence of left breast and nipple: Secondary | ICD-10-CM | POA: Diagnosis not present

## 2018-03-04 DIAGNOSIS — C50812 Malignant neoplasm of overlapping sites of left female breast: Secondary | ICD-10-CM

## 2018-03-04 DIAGNOSIS — C50412 Malignant neoplasm of upper-outer quadrant of left female breast: Secondary | ICD-10-CM

## 2018-03-04 DIAGNOSIS — Z170 Estrogen receptor positive status [ER+]: Secondary | ICD-10-CM | POA: Diagnosis not present

## 2018-03-04 DIAGNOSIS — C3482 Malignant neoplasm of overlapping sites of left bronchus and lung: Secondary | ICD-10-CM

## 2018-03-04 DIAGNOSIS — C773 Secondary and unspecified malignant neoplasm of axilla and upper limb lymph nodes: Secondary | ICD-10-CM

## 2018-03-04 DIAGNOSIS — C7951 Secondary malignant neoplasm of bone: Secondary | ICD-10-CM

## 2018-03-04 DIAGNOSIS — M810 Age-related osteoporosis without current pathological fracture: Secondary | ICD-10-CM | POA: Diagnosis not present

## 2018-03-04 DIAGNOSIS — C50811 Malignant neoplasm of overlapping sites of right female breast: Secondary | ICD-10-CM

## 2018-03-04 DIAGNOSIS — Z79811 Long term (current) use of aromatase inhibitors: Secondary | ICD-10-CM

## 2018-03-04 DIAGNOSIS — C50912 Malignant neoplasm of unspecified site of left female breast: Secondary | ICD-10-CM

## 2018-03-04 DIAGNOSIS — Z5112 Encounter for antineoplastic immunotherapy: Secondary | ICD-10-CM | POA: Diagnosis not present

## 2018-03-04 DIAGNOSIS — C50919 Malignant neoplasm of unspecified site of unspecified female breast: Secondary | ICD-10-CM

## 2018-03-04 LAB — COMPREHENSIVE METABOLIC PANEL
ALK PHOS: 92 U/L (ref 40–150)
ALT: 35 U/L (ref 0–55)
ANION GAP: 9 (ref 3–11)
AST: 37 U/L — ABNORMAL HIGH (ref 5–34)
Albumin: 3.3 g/dL — ABNORMAL LOW (ref 3.5–5.0)
BILIRUBIN TOTAL: 0.6 mg/dL (ref 0.2–1.2)
BUN: 9 mg/dL (ref 7–26)
CALCIUM: 9.8 mg/dL (ref 8.4–10.4)
CO2: 28 mmol/L (ref 22–29)
Chloride: 101 mmol/L (ref 98–109)
Creatinine, Ser: 0.93 mg/dL (ref 0.60–1.10)
GFR calc non Af Amer: 53 mL/min — ABNORMAL LOW (ref >60)
Glucose, Bld: 103 mg/dL (ref 70–140)
Potassium: 3.5 mmol/L (ref 3.5–5.1)
Sodium: 138 mmol/L (ref 136–145)
Total Protein: 6.6 g/dL (ref 6.4–8.3)

## 2018-03-04 LAB — CBC WITH DIFFERENTIAL/PLATELET
Basophils Absolute: 0 10*3/uL (ref 0.0–0.1)
Basophils Relative: 0 %
EOS ABS: 0.3 10*3/uL (ref 0.0–0.5)
Eosinophils Relative: 5 %
HEMATOCRIT: 37.1 % (ref 34.8–46.6)
HEMOGLOBIN: 12.1 g/dL (ref 11.6–15.9)
Lymphocytes Relative: 19 %
Lymphs Abs: 1.1 10*3/uL (ref 0.9–3.3)
MCH: 28 pg (ref 25.1–34.0)
MCHC: 32.6 g/dL (ref 31.5–36.0)
MCV: 85.9 fL (ref 79.5–101.0)
MONO ABS: 0.3 10*3/uL (ref 0.1–0.9)
Monocytes Relative: 6 %
NEUTROS ABS: 4 10*3/uL (ref 1.5–6.5)
NEUTROS PCT: 70 %
Platelets: 198 10*3/uL (ref 145–400)
RBC: 4.32 MIL/uL (ref 3.70–5.45)
RDW: 14 % (ref 11.2–14.5)
WBC: 5.8 10*3/uL (ref 3.9–10.3)

## 2018-03-04 MED ORDER — ACETAMINOPHEN 325 MG PO TABS
ORAL_TABLET | ORAL | Status: AC
  Filled 2018-03-04: qty 2

## 2018-03-04 MED ORDER — HEPARIN SOD (PORK) LOCK FLUSH 100 UNIT/ML IV SOLN
500.0000 [IU] | Freq: Once | INTRAVENOUS | Status: AC | PRN
  Administered 2018-03-04: 500 [IU]
  Filled 2018-03-04: qty 5

## 2018-03-04 MED ORDER — TRASTUZUMAB CHEMO 150 MG IV SOLR
6.0000 mg/kg | Freq: Once | INTRAVENOUS | Status: AC
  Administered 2018-03-04: 336 mg via INTRAVENOUS
  Filled 2018-03-04: qty 16

## 2018-03-04 MED ORDER — DIPHENHYDRAMINE HCL 25 MG PO CAPS
25.0000 mg | ORAL_CAPSULE | Freq: Once | ORAL | Status: AC
  Administered 2018-03-04: 25 mg via ORAL

## 2018-03-04 MED ORDER — ACETAMINOPHEN 325 MG PO TABS
650.0000 mg | ORAL_TABLET | Freq: Once | ORAL | Status: AC
  Administered 2018-03-04: 650 mg via ORAL

## 2018-03-04 MED ORDER — SODIUM CHLORIDE 0.9 % IV SOLN
3.3000 mg | Freq: Once | INTRAVENOUS | Status: AC
  Administered 2018-03-04: 3.3 mg via INTRAVENOUS
  Filled 2018-03-04: qty 4.13

## 2018-03-04 MED ORDER — DIPHENHYDRAMINE HCL 25 MG PO CAPS
ORAL_CAPSULE | ORAL | Status: AC
  Filled 2018-03-04: qty 1

## 2018-03-04 MED ORDER — SODIUM CHLORIDE 0.9% FLUSH
10.0000 mL | INTRAVENOUS | Status: DC | PRN
Start: 2018-03-04 — End: 2018-03-04
  Administered 2018-03-04: 10 mL
  Filled 2018-03-04: qty 10

## 2018-03-04 NOTE — Telephone Encounter (Signed)
Gave patient AVS and calendar of upcoming appointments.  °

## 2018-03-04 NOTE — Patient Instructions (Addendum)
[facility] Discharge Instructions for Patients Receiving Chemotherapy  Today you received the following chemotherapy agents Herceptin and Zometa  To help prevent nausea and vomiting after your treatment, we encourage you to take your nausea medication as directed    If you develop nausea and vomiting that is not controlled by your nausea medication, call the clinic.   BELOW ARE SYMPTOMS THAT SHOULD BE REPORTED IMMEDIATELY:  *FEVER GREATER THAN 100.5 F  *CHILLS WITH OR WITHOUT FEVER  NAUSEA AND VOMITING THAT IS NOT CONTROLLED WITH YOUR NAUSEA MEDICATION  *UNUSUAL SHORTNESS OF BREATH  *UNUSUAL BRUISING OR BLEEDING  TENDERNESS IN MOUTH AND THROAT WITH OR WITHOUT PRESENCE OF ULCERS  *URINARY PROBLEMS  *BOWEL PROBLEMS  UNUSUAL RASH Items with * indicate a potential emergency and should be followed up as soon as possible.  Feel free to call the clinic should you have any questions or concerns. The clinic phone number is [phone].  Please show the [facility] at check-in to the Emergency Department and triage nurse.

## 2018-03-05 ENCOUNTER — Encounter (HOSPITAL_COMMUNITY): Payer: Self-pay | Admitting: General Surgery

## 2018-03-05 NOTE — Addendum Note (Signed)
Addendum  created 03/05/18 2049 by Oleta Mouse, MD   Intraprocedure Event edited, Intraprocedure Staff edited

## 2018-03-07 DIAGNOSIS — M1990 Unspecified osteoarthritis, unspecified site: Secondary | ICD-10-CM | POA: Diagnosis not present

## 2018-03-07 DIAGNOSIS — R41841 Cognitive communication deficit: Secondary | ICD-10-CM | POA: Diagnosis not present

## 2018-03-07 DIAGNOSIS — Z4889 Encounter for other specified surgical aftercare: Secondary | ICD-10-CM | POA: Diagnosis not present

## 2018-03-07 DIAGNOSIS — Z902 Acquired absence of lung [part of]: Secondary | ICD-10-CM | POA: Diagnosis not present

## 2018-03-07 DIAGNOSIS — J479 Bronchiectasis, uncomplicated: Secondary | ICD-10-CM | POA: Diagnosis not present

## 2018-03-07 DIAGNOSIS — R2681 Unsteadiness on feet: Secondary | ICD-10-CM | POA: Diagnosis not present

## 2018-03-07 DIAGNOSIS — M6281 Muscle weakness (generalized): Secondary | ICD-10-CM | POA: Diagnosis not present

## 2018-03-07 DIAGNOSIS — Z950 Presence of cardiac pacemaker: Secondary | ICD-10-CM | POA: Diagnosis not present

## 2018-03-07 DIAGNOSIS — M316 Other giant cell arteritis: Secondary | ICD-10-CM | POA: Diagnosis not present

## 2018-03-07 DIAGNOSIS — E049 Nontoxic goiter, unspecified: Secondary | ICD-10-CM | POA: Diagnosis not present

## 2018-03-07 DIAGNOSIS — Z7389 Other problems related to life management difficulty: Secondary | ICD-10-CM | POA: Diagnosis not present

## 2018-03-07 DIAGNOSIS — K589 Irritable bowel syndrome without diarrhea: Secondary | ICD-10-CM | POA: Diagnosis not present

## 2018-03-07 DIAGNOSIS — J3089 Other allergic rhinitis: Secondary | ICD-10-CM | POA: Diagnosis not present

## 2018-03-09 ENCOUNTER — Ambulatory Visit (INDEPENDENT_AMBULATORY_CARE_PROVIDER_SITE_OTHER): Payer: Medicare Other | Admitting: Internal Medicine

## 2018-03-09 ENCOUNTER — Encounter: Payer: Self-pay | Admitting: Internal Medicine

## 2018-03-09 VITALS — BP 124/64 | HR 87 | Ht 60.0 in | Wt 123.0 lb

## 2018-03-09 DIAGNOSIS — Z950 Presence of cardiac pacemaker: Secondary | ICD-10-CM | POA: Diagnosis not present

## 2018-03-09 DIAGNOSIS — I480 Paroxysmal atrial fibrillation: Secondary | ICD-10-CM | POA: Diagnosis not present

## 2018-03-09 DIAGNOSIS — I442 Atrioventricular block, complete: Secondary | ICD-10-CM | POA: Diagnosis not present

## 2018-03-09 LAB — CUP PACEART INCLINIC DEVICE CHECK
Battery Remaining Longevity: 6 mo
Battery Voltage: 2.65 V
Date Time Interrogation Session: 20190522135032
Implantable Lead Implant Date: 19991013
Implantable Lead Implant Date: 19991013
Implantable Lead Location: 753859
Implantable Lead Location: 753860
Implantable Lead Special Function: HIGH
Implantable Lead Special Function: HIGH
Lead Channel Impedance Value: 284 Ohm
Lead Channel Pacing Threshold Amplitude: 1 V
Lead Channel Pacing Threshold Pulse Width: 0.4 ms
Lead Channel Pacing Threshold Pulse Width: 0.4 ms
Lead Channel Setting Pacing Amplitude: 2 V
Lead Channel Setting Pacing Pulse Width: 0.4 ms
MDC IDC MSMT BATTERY IMPEDANCE: 14400 Ohm
MDC IDC MSMT LEADCHNL RA IMPEDANCE VALUE: 289 Ohm
MDC IDC MSMT LEADCHNL RA PACING THRESHOLD AMPLITUDE: 0.5 V
MDC IDC PG IMPLANT DT: 20091119
MDC IDC PG SERIAL: 2233979
MDC IDC SET LEADCHNL RV PACING AMPLITUDE: 2.5 V
MDC IDC SET LEADCHNL RV SENSING SENSITIVITY: 4 mV

## 2018-03-09 NOTE — Patient Instructions (Addendum)
Medication Instructions:  Your physician recommends that you continue on your current medications as directed. Please refer to the Current Medication list given to you today.  Labwork: None ordered.  Testing/Procedures: None ordered.  Follow-Up: Your physician wants you to follow-up in:  3 months with device clinic for a device check.  Your physician wants you to follow-up in: 12 months with Dr. Rayann Heman.   You will receive a reminder letter in the mail two months in advance. If you don't receive a letter, please call our office to schedule the follow-up appointment.  Any Other Special Instructions Will Be Listed Below (If Applicable).  If you need a refill on your cardiac medications before your next appointment, please call your pharmacy.

## 2018-03-09 NOTE — Progress Notes (Signed)
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 female who presents today for routine electrophysiology followup.  Since last being seen in our clinic, the patient reports doing reasonably well.  Her breast CA has progressed. She is s/p L mastectomy 4 weeks ago.  she has also started chemotherapy.  Today, she denies symptoms of palpitations, chest pain, shortness of breath,  lower extremity edema, dizziness, presyncope, or syncope.  The patient is otherwise without complaint today.   Past Medical History:  Diagnosis Date  . Asthma    "as a child"  . Breast cancer ([place]) 12/08/2014   Bilateral  . Cancer of central portion of female breast ([place]) 12/04/2014  . Cancer of central portion of female breast ([place]) 12/04/2014  . Complete heart block ([place]) 1999   s/p PPM by Dr Olevia Perches, most recent generator change 2009  . Complication of anesthesia   . COPD (chronic obstructive pulmonary disease) ([place])   . Dementia   . Diverticulosis   . DJD (degenerative joint disease)    RA  . Esophageal stricture   . Family history of adverse reaction to anesthesia    Daughter N/V  . GERD (gastroesophageal reflux disease)   . Giant cell arteritis ([place])   . Goiter   . Hemorrhoids   . Hiatal hernia   . IBS (irritable bowel syndrome)   . Paroxysmal atrial fibrillation ([place]) 12/17/2015   asymptomatic, <1%, determined on regular pacemaker interrogation  . Pneumonia   . PONV (postoperative nausea and vomiting)   . S/P radiation therapy 04/24/15 completed   T-11  . Wears glasses    Past Surgical History:  Procedure Laterality Date  . ANAL FISSURE REPAIR    . ARTERY BIOPSY Left 01/03/2013   Procedure: BIOPSY LEFT TEMPORAL ARTERY;  Surgeon: Ascencion Dike, MD;  Location: [facility];  Service: ENT;  Laterality: Left;  . BREAST BIOPSY Right   . CHOLECYSTECTOMY    . COLONOSCOPY    . FRACTURE SURGERY     fx lt wrist  . HEMORRHOID SURGERY    . INSERT / REPLACE /  [place]   Gen change (SJM) by Dr Olevia Perches 2009  . IR FLUORO GUIDE PORT INSERTION RIGHT  12/22/2017  . IR US GUIDE VASC ACCESS RIGHT  12/22/2017  . LUNG REMOVAL, PARTIAL  1950   left lower lobectomy for brochiectasis   . MASTECTOMY Left 02/08/2018  . MOUTH SURGERY     patient unaware  . RECTAL POLYPECTOMY    . RENAL CYST EXCISION    . TONSILLECTOMY    . TOTAL MASTECTOMY Left 02/08/2018  . TOTAL MASTECTOMY Left 02/08/2018   Procedure: TOTAL MASTECTOMY LEFT;  Surgeon: Fanny Skates, MD;  Location: [facility];  Service: General;  Laterality: Left;    ROS- all systems are reviewed and negative except as per HPI above  Current Outpatient Medications  Medication Sig Dispense Refill  . Calcium Carb-Cholecalciferol (CALTRATE 600+D3 PO) Take 1 tablet by mouth daily.    . diclofenac sodium (VOLTAREN) 1 % GEL Apply 2 g topically 3 (three) times daily as needed (APPLY TO SHOULDER AND LEFT KNEE  FOR PAIN).     Marland Kitchen everolimus (AFINITOR) 2.5 MG tablet Take 1 tablet (2.5 mg total) by mouth daily. Caution:chemotherapy. 30 tablet 12  . exemestane (AROMASIN) 25 MG tablet Take 1 tablet (25 mg total) by mouth daily after breakfast. 90 tablet 4  .  lactose free nutrition (BOOST) LIQD Take 237 mLs daily by mouth.    . loratadine (CLARITIN) 10 MG tablet Take 10 mg by mouth daily as needed for allergies.    . metoprolol succinate (TOPROL-XL) 50 MG 24 hr tablet Take 50 mg by mouth daily. Take with or immediately following a meal.    . predniSONE (DELTASONE) 5 MG tablet Take 5 mg by mouth daily with breakfast. Reported on 11/19/2015    . traMADol (ULTRAM) 50 MG tablet Take 50 mg by mouth every 6 (six) hours as needed (FOR PAIN).     . Turmeric 500 MG TABS Take 500 mg by mouth daily.     No current facility-administered medications for this visit.     Physical Exam: Vitals:   03/09/18 1157  BP: 124/64  Pulse: 87  Weight: 123 lb (55.8 kg)  Height: 5' (1.524 m)    GEN- The patient is well appearing,  alert and oriented x 3 today.   Head- normocephalic, atraumatic Eyes-  Sclera clear, conjunctiva pink Ears- hearing intact Oropharynx- clear Lungs- Clear to ausculation bilaterally, normal work of breathing Chest- pacemaker pocket is well healed, L mastectomy site is healing nicely and does not appear to be affecting PPM pocket Heart- Regular rate and rhythm, no murmurs, rubs or gallops, PMI not laterally displaced GI- soft, NT, ND, + BS Extremities- no clubbing, cyanosis, or edema  Pacemaker interrogation- reviewed in detail today,  See PACEART report  ekg tracing ordered today is personally reviewed and shows sinus with V pacing  Assessment and Plan:  1. Symptomatic complete heart block Normal pacemaker function See Pace Art report No changes today Approaching ERI (6 months estimated longevity) Risks, benefits, and alternatives to pacemaker pulse generator replacement were discussed in detail today.  The patient understands that risks include but are not limited to bleeding, infection, pneumothorax, perforation, tamponade, vascular damage, renal failure, MI, stroke, death,  damage to his existing leads, and lead dislodgement and wishes to proceed once ERI.  If ERI prior to next visit with me, ok to schedule without another office visit Return in 3 months to the device clinic  2. Paroxysmal atrial fibrillation Asymptomatic 1.9 % by PPM interrogation (<1% last visit) Consider anticoagulation if AF burden increases  Return in 3 months for device clinic visit I will see in a year unless she reaches ERI in the interim.  Thompson Grayer MD, [facility] 03/09/2018 12:23 PM

## 2018-03-10 DIAGNOSIS — Z7389 Other problems related to life management difficulty: Secondary | ICD-10-CM | POA: Diagnosis not present

## 2018-03-10 DIAGNOSIS — R2681 Unsteadiness on feet: Secondary | ICD-10-CM | POA: Diagnosis not present

## 2018-03-10 DIAGNOSIS — Z4889 Encounter for other specified surgical aftercare: Secondary | ICD-10-CM | POA: Diagnosis not present

## 2018-03-10 DIAGNOSIS — Z902 Acquired absence of lung [part of]: Secondary | ICD-10-CM | POA: Diagnosis not present

## 2018-03-10 DIAGNOSIS — Z950 Presence of cardiac pacemaker: Secondary | ICD-10-CM | POA: Diagnosis not present

## 2018-03-10 DIAGNOSIS — M6281 Muscle weakness (generalized): Secondary | ICD-10-CM | POA: Diagnosis not present

## 2018-03-15 DIAGNOSIS — Z4889 Encounter for other specified surgical aftercare: Secondary | ICD-10-CM | POA: Diagnosis not present

## 2018-03-15 DIAGNOSIS — Z902 Acquired absence of lung [part of]: Secondary | ICD-10-CM | POA: Diagnosis not present

## 2018-03-15 DIAGNOSIS — Z7389 Other problems related to life management difficulty: Secondary | ICD-10-CM | POA: Diagnosis not present

## 2018-03-15 DIAGNOSIS — M6281 Muscle weakness (generalized): Secondary | ICD-10-CM | POA: Diagnosis not present

## 2018-03-15 DIAGNOSIS — R2681 Unsteadiness on feet: Secondary | ICD-10-CM | POA: Diagnosis not present

## 2018-03-15 DIAGNOSIS — Z950 Presence of cardiac pacemaker: Secondary | ICD-10-CM | POA: Diagnosis not present

## 2018-03-16 DIAGNOSIS — Z4889 Encounter for other specified surgical aftercare: Secondary | ICD-10-CM | POA: Diagnosis not present

## 2018-03-16 DIAGNOSIS — R2681 Unsteadiness on feet: Secondary | ICD-10-CM | POA: Diagnosis not present

## 2018-03-16 DIAGNOSIS — Z950 Presence of cardiac pacemaker: Secondary | ICD-10-CM | POA: Diagnosis not present

## 2018-03-16 DIAGNOSIS — Z7389 Other problems related to life management difficulty: Secondary | ICD-10-CM | POA: Diagnosis not present

## 2018-03-16 DIAGNOSIS — Z902 Acquired absence of lung [part of]: Secondary | ICD-10-CM | POA: Diagnosis not present

## 2018-03-16 DIAGNOSIS — M6281 Muscle weakness (generalized): Secondary | ICD-10-CM | POA: Diagnosis not present

## 2018-03-17 DIAGNOSIS — Z902 Acquired absence of lung [part of]: Secondary | ICD-10-CM | POA: Diagnosis not present

## 2018-03-17 DIAGNOSIS — R2681 Unsteadiness on feet: Secondary | ICD-10-CM | POA: Diagnosis not present

## 2018-03-17 DIAGNOSIS — Z7389 Other problems related to life management difficulty: Secondary | ICD-10-CM | POA: Diagnosis not present

## 2018-03-17 DIAGNOSIS — Z950 Presence of cardiac pacemaker: Secondary | ICD-10-CM | POA: Diagnosis not present

## 2018-03-17 DIAGNOSIS — Z4889 Encounter for other specified surgical aftercare: Secondary | ICD-10-CM | POA: Diagnosis not present

## 2018-03-17 DIAGNOSIS — M6281 Muscle weakness (generalized): Secondary | ICD-10-CM | POA: Diagnosis not present

## 2018-03-18 ENCOUNTER — Other Ambulatory Visit: Payer: Medicare Other

## 2018-03-18 ENCOUNTER — Ambulatory Visit: Payer: Medicare Other

## 2018-03-21 DIAGNOSIS — R41841 Cognitive communication deficit: Secondary | ICD-10-CM | POA: Diagnosis not present

## 2018-03-21 DIAGNOSIS — R2681 Unsteadiness on feet: Secondary | ICD-10-CM | POA: Diagnosis not present

## 2018-03-21 DIAGNOSIS — R1312 Dysphagia, oropharyngeal phase: Secondary | ICD-10-CM | POA: Diagnosis not present

## 2018-03-21 DIAGNOSIS — M6281 Muscle weakness (generalized): Secondary | ICD-10-CM | POA: Diagnosis not present

## 2018-03-21 DIAGNOSIS — M316 Other giant cell arteritis: Secondary | ICD-10-CM | POA: Diagnosis not present

## 2018-03-21 DIAGNOSIS — Z4889 Encounter for other specified surgical aftercare: Secondary | ICD-10-CM | POA: Diagnosis not present

## 2018-03-21 DIAGNOSIS — Z902 Acquired absence of lung [part of]: Secondary | ICD-10-CM | POA: Diagnosis not present

## 2018-03-21 DIAGNOSIS — J3089 Other allergic rhinitis: Secondary | ICD-10-CM | POA: Diagnosis not present

## 2018-03-21 DIAGNOSIS — K589 Irritable bowel syndrome without diarrhea: Secondary | ICD-10-CM | POA: Diagnosis not present

## 2018-03-21 DIAGNOSIS — Z7389 Other problems related to life management difficulty: Secondary | ICD-10-CM | POA: Diagnosis not present

## 2018-03-21 DIAGNOSIS — J479 Bronchiectasis, uncomplicated: Secondary | ICD-10-CM | POA: Diagnosis not present

## 2018-03-21 DIAGNOSIS — Z950 Presence of cardiac pacemaker: Secondary | ICD-10-CM | POA: Diagnosis not present

## 2018-03-21 DIAGNOSIS — E049 Nontoxic goiter, unspecified: Secondary | ICD-10-CM | POA: Diagnosis not present

## 2018-03-21 DIAGNOSIS — M1990 Unspecified osteoarthritis, unspecified site: Secondary | ICD-10-CM | POA: Diagnosis not present

## 2018-03-22 ENCOUNTER — Non-Acute Institutional Stay: Payer: Medicare Other | Admitting: Nurse Practitioner

## 2018-03-22 ENCOUNTER — Encounter: Payer: Self-pay | Admitting: Nurse Practitioner

## 2018-03-22 DIAGNOSIS — R05 Cough: Secondary | ICD-10-CM

## 2018-03-22 DIAGNOSIS — R5382 Chronic fatigue, unspecified: Secondary | ICD-10-CM | POA: Diagnosis not present

## 2018-03-22 DIAGNOSIS — R5381 Other malaise: Secondary | ICD-10-CM | POA: Diagnosis not present

## 2018-03-22 DIAGNOSIS — Z902 Acquired absence of lung [part of]: Secondary | ICD-10-CM | POA: Diagnosis not present

## 2018-03-22 DIAGNOSIS — R1312 Dysphagia, oropharyngeal phase: Secondary | ICD-10-CM | POA: Diagnosis not present

## 2018-03-22 DIAGNOSIS — Z4889 Encounter for other specified surgical aftercare: Secondary | ICD-10-CM | POA: Diagnosis not present

## 2018-03-22 DIAGNOSIS — R2681 Unsteadiness on feet: Secondary | ICD-10-CM | POA: Diagnosis not present

## 2018-03-22 DIAGNOSIS — R058 Other specified cough: Secondary | ICD-10-CM | POA: Insufficient documentation

## 2018-03-22 DIAGNOSIS — Z950 Presence of cardiac pacemaker: Secondary | ICD-10-CM | POA: Diagnosis not present

## 2018-03-22 DIAGNOSIS — Z7389 Other problems related to life management difficulty: Secondary | ICD-10-CM | POA: Diagnosis not present

## 2018-03-22 NOTE — Progress Notes (Signed)
Location:  [facility] Room Number: 762 Place of Service:  ALF [phone]) Provider:  Deveion Denz, ManXie  NP  Blanchie Serve, MD  Patient Care Team: Blanchie Serve, MD as PCP - General (Internal Medicine) Fanny Skates, MD as Consulting Physician (General Surgery) [name], [name], MD as Consulting Physician (Oncology) [name], MD as Consulting Physician (Radiation Oncology) Rockwell Germany, RN as Registered Nurse Mauro Kaufmann, RN as Registered Nurse Gentry Fitz, MD as Consulting Physician (Family Medicine) Jelan Batterton X, NP as Nurse Practitioner (Internal Medicine) Thompson Grayer, MD as Consulting Physician (Cardiology)  Extended Emergency Contact Information Primary Emergency Contact: Matthews,[name] Address: [address]          [place], [place] [address] [place] Mobile Phone: [phone] Relation: Daughter Secondary Emergency Contact: Clent Demark [place] Phone: [phone] Relation: Other  Code Status:  DNR Goals of care: Advanced Directive information Advanced Directives 03/22/2018  Does Patient Have a Medical Advance Directive? Yes  Type of Paramedic of [place];Out of facility DNR (pink MOST or yellow form)  Does patient want to make changes to medical advance directive? No - Patient declined  Copy of [place] in Chart? Yes  Would patient like information on creating a medical advance directive? -  Pre-existing out of facility DNR order (yellow form or pink MOST form) Yellow form placed in chart (order not valid for inpatient use)     Chief Complaint  Patient presents with  . Acute Visit    C/o cough and congestion    HPI:  Pt is a 84 y.o. female seen today for an acute visit for hacking cough x 3 days, persisted, about same, non productive, mild chest tightness felt when coughed, not associated with swallowing, not interfere night sleep, no O2  desaturation. She attributed it to her bathroom cleansing products. Given hx of allergic rhinitis, left lung/breast cancer, and COPD/chronic bronchitis. She also stated she is feeling more tired than usual. She denied nausea, vomiting, constipation, or abdominal pain.    Past Medical History:  Diagnosis Date  . Asthma    "as a child"  . Breast cancer ([facility]) 12/08/2014   Bilateral  . Cancer of central portion of female breast ([place]) 12/04/2014  . Cancer of central portion of female breast ([place]) 12/04/2014  . Complete heart block ([place]) 1999   s/p PPM by Dr Olevia Perches, most recent generator change 2009  . Complication of anesthesia   . COPD (chronic obstructive pulmonary disease) ([place])   . Dementia   . Diverticulosis   . DJD (degenerative joint disease)    RA  . Esophageal stricture   . Family history of adverse reaction to anesthesia    Daughter N/V  . GERD (gastroesophageal reflux disease)   . Giant cell arteritis ([place])   . Goiter   . Hemorrhoids   . Hiatal hernia   . IBS (irritable bowel syndrome)   . Paroxysmal atrial fibrillation ([place]) 12/17/2015   asymptomatic, <1%, determined on regular pacemaker interrogation  . Pneumonia   . PONV (postoperative nausea and vomiting)   . S/P radiation therapy 04/24/15 completed   T-11  . Wears glasses    Past Surgical History:  Procedure Laterality Date  . ANAL FISSURE REPAIR    . ARTERY BIOPSY Left 01/03/2013   Procedure: BIOPSY LEFT TEMPORAL ARTERY;  Surgeon: Ascencion Dike, MD;  Location: [facility];  Service: ENT;  Laterality: Left;  .  BREAST BIOPSY Right   . CHOLECYSTECTOMY    . COLONOSCOPY    . FRACTURE SURGERY     fx lt wrist  . HEMORRHOID SURGERY    . INSERT / REPLACE / [place]   Gen change (SJM) by Dr Olevia Perches 2009  . IR FLUORO GUIDE PORT INSERTION RIGHT  12/22/2017  . IR US GUIDE VASC ACCESS RIGHT  12/22/2017  . LUNG REMOVAL, PARTIAL  1950   left lower lobectomy for brochiectasis   . MASTECTOMY Left  02/08/2018  . MOUTH SURGERY     patient unaware  . RECTAL POLYPECTOMY    . RENAL CYST EXCISION    . TONSILLECTOMY    . TOTAL MASTECTOMY Left 02/08/2018  . TOTAL MASTECTOMY Left 02/08/2018   Procedure: TOTAL MASTECTOMY LEFT;  Surgeon: Fanny Skates, MD;  Location: [facility];  Service: General;  Laterality: Left;    Allergies  Allergen Reactions  . Amoxicillin-Pot Clavulanate Other (See Comments)    Upset stomach Has patient had a PCN reaction causing immediate rash, facial/tongue/throat swelling, SOB or lightheadedness with hypotension: No Has patient had a PCN reaction causing severe rash involving mucus membranes or skin necrosis: No Has patient had a PCN reaction that required hospitalization: No Has patient had a PCN reaction occurring within the last 10 years: No If all of the above answers are "NO", then may proceed with Cephalosporin use.   Marland Kitchen Morphine And Related Nausea Only    Outpatient Encounter Medications as of 03/22/2018  Medication Sig  . Calcium Carb-Cholecalciferol (CALTRATE 600+D3 PO) Take 1 tablet by mouth daily.  . diclofenac sodium (VOLTAREN) 1 % GEL Apply 2 g topically 3 (three) times daily as needed (APPLY TO SHOULDER AND LEFT KNEE  FOR PAIN).   Marland Kitchen everolimus (AFINITOR) 2.5 MG tablet Take 1 tablet (2.5 mg total) by mouth daily. Caution:chemotherapy.  Marland Kitchen exemestane (AROMASIN) 25 MG tablet Take 1 tablet (25 mg total) by mouth daily after breakfast.  . metoprolol succinate (TOPROL-XL) 50 MG 24 hr tablet Take 50 mg by mouth daily. Take with or immediately following a meal.  . predniSONE (DELTASONE) 5 MG tablet Take 5 mg by mouth daily with breakfast. Reported on 11/19/2015  . traMADol (ULTRAM) 50 MG tablet Take 50 mg by mouth every 6 (six) hours as needed (FOR PAIN).   . Turmeric 500 MG TABS Take 500 mg by mouth daily.  . [DISCONTINUED] lactose free nutrition (BOOST) LIQD Take 237 mLs daily by mouth.  . [DISCONTINUED] loratadine (CLARITIN) 10 MG tablet Take 10 mg by mouth  daily as needed for allergies.   No facility-administered encounter medications on file as of 03/22/2018.     Review of Systems  Constitutional: Positive for activity change and fatigue. Negative for chills, diaphoresis and fever.  HENT: Positive for hearing loss. Negative for congestion, trouble swallowing and voice change.   Eyes: Negative for visual disturbance.  Respiratory: Positive for cough and chest tightness. Negative for choking, shortness of breath and wheezing.   Cardiovascular: Negative for chest pain, palpitations and leg swelling.  Gastrointestinal: Negative for abdominal distention and abdominal pain.  Genitourinary: Negative for difficulty urinating, dysuria and urgency.  Musculoskeletal: Positive for gait problem.  Skin: Negative for color change and pallor.  Neurological: Negative for dizziness, speech difficulty, weakness and headaches.       Memory lapses.   Psychiatric/Behavioral: Negative for agitation, behavioral problems, confusion, hallucinations and sleep disturbance. The patient is not nervous/anxious.     Immunization History  Administered  Date(s) Administered  . Influenza Split 07/24/2011, 06/20/2012  . Influenza Whole 10/19/2001, 07/25/2007, 08/02/2008, 07/18/2009, 07/10/2010  . Influenza,inj,Quad PF,6+ Mos 07/18/2013, 08/27/2014  . Influenza-Unspecified 07/31/2015, 08/19/2016, 08/09/2017  . Pneumococcal Conjugate-13 08/16/2017  . Pneumococcal Polysaccharide-23 10/19/1997, 01/17/2010  . Td 10/19/1996, 07/25/2007   Pertinent  Health Maintenance Due  Topic Date Due  . INFLUENZA VACCINE  05/19/2018  . DEXA SCAN  Completed  . PNA vac Low Risk Adult  Completed   Fall Risk  07/01/2017 02/22/2017 05/22/2015 03/22/2015 08/02/2014  Falls in the past year? No No No No No   Functional Status Survey:    Vitals:   03/22/18 1028  BP: 140/80  Pulse: 80  Resp: 18  Temp: 98.9 F (37.2 C)  SpO2: 94%  Weight: 124 lb (56.2 kg)  Height: 5' (1.524 m)   Body mass  index is 24.22 kg/m. Physical Exam  Constitutional: She appears well-developed and well-nourished. No distress.  HENT:  Head: Normocephalic and atraumatic.  Mouth/Throat: Oropharynx is clear and moist. No oropharyngeal exudate.  Eyes: Pupils are equal, round, and reactive to light. EOM are normal. Right eye exhibits no discharge. Left eye exhibits no discharge. No scleral icterus.  Neck: Normal range of motion. Neck supple. No JVD present. No thyromegaly present.  Cardiovascular: Normal rate and regular rhythm.  Pacemaker.   Pulmonary/Chest: Effort normal. She has no wheezes. She has no rales.  Decreased air entry .  Abdominal: Soft. Bowel sounds are normal.  Musculoskeletal: She exhibits no edema.  Independent transfer and ambulating.   Neurological: She is alert. No cranial nerve deficit. She exhibits normal muscle tone. Coordination normal.  Oriented to person, place  Skin: Skin is warm and dry. She is not diaphoretic.  Psychiatric: She has a normal mood and affect. Her behavior is normal.    Labs reviewed: Recent Labs    02/03/18 1344  02/09/18 0456 02/17/18 03/04/18 1034  NA 135  --  135 136 138  K 4.2  --  4.0 4.1 3.5  CL 101  --  102  --  101  CO2 24  --  23  --  28  GLUCOSE 203*  --  237*  --  103  BUN 15  --  11 11 9   CREATININE 0.85   < > 0.87 0.81 0.93  CALCIUM 9.3  --  8.5* 9.0 9.8   < > = values in this interval not displayed.   Recent Labs    01/07/18 0959 02/03/18 1344 02/17/18 03/04/18 1034  AST 35* 45* 37 37*  ALT 33 45 32 35  ALKPHOS 93 92 71 92  BILITOT 0.7 0.8 0.7 0.6  PROT 6.7 6.3*  --  6.6  ALBUMIN 3.3* 3.4* 3.7 3.3*   Recent Labs    01/07/18 0959 02/03/18 1344 02/08/18 1516 02/09/18 0456 03/04/18 1034  WBC 7.1 7.1 6.5 7.7 5.8  NEUTROABS 5.2 6.0  --   --  4.0  HGB 12.6 12.6 12.4 11.2* 12.1  HCT 39.5 39.5 37.8 35.6* 37.1  MCV 91.4 89.4 87.3 87.5 85.9  PLT 225 225 205 222 198   Lab Results  Component Value Date   TSH 1.07  08/31/2017   Lab Results  Component Value Date   HGBA1C 6.1 (H) 02/09/2018   Lab Results  Component Value Date   CHOL 194 07/25/2007   HDL 62.7 07/25/2007   LDLCALC 118 (H) 07/25/2007   TRIG 69 07/25/2007   CHOLHDL 3.1 CALC 07/25/2007    Significant Diagnostic  Results in last 30 days:  No results found.  Assessment/Plan Allergic cough Hacking cough x 3 days, persisted, about same, non productive, mild chest tightness felt when coughed, not associated with swallowing, not interfere night sleep, no O2 desaturation. She attributed it to her bathroom cleansing products. Given hx of allergic rhinitis, left lung/breast cancer, and COPD/chronic bronchitis, will obtain CXR ap and lateral to evaluate acute cardiopulmonary process. Will schedule prn Claritin to daily x 5 days. Mucinex 600mg  bid x 5 days.   Chronic fatigue and malaise Multiple factorials, it seems worse since the onset of cough about 3 days ago, will obtain CXR to evaluate acute cardiopulmonary process, she sees her oncologist in the afternoon.      Family/ staff Communication: plan of care reviewed with the patient and charge nurse.   Labs/tests ordered:  CXR AP and lateral.   Time spend 25 minutes.

## 2018-03-22 NOTE — Assessment & Plan Note (Signed)
Multiple factorials, it seems worse since the onset of cough about 3 days ago, will obtain CXR to evaluate acute cardiopulmonary process, she sees her oncologist in the afternoon.

## 2018-03-22 NOTE — Assessment & Plan Note (Addendum)
Hacking cough x 3 days, persisted, about same, non productive, mild chest tightness felt when coughed, not associated with swallowing, not interfere night sleep, no O2 desaturation. She attributed it to her bathroom cleansing products. Given hx of allergic rhinitis, left lung/breast cancer, and COPD/chronic bronchitis, will obtain CXR ap and lateral to evaluate acute cardiopulmonary process. Will schedule prn Claritin to daily x 5 days. Mucinex 600mg  bid x 5 days.

## 2018-03-23 DIAGNOSIS — Z950 Presence of cardiac pacemaker: Secondary | ICD-10-CM | POA: Diagnosis not present

## 2018-03-23 DIAGNOSIS — Z902 Acquired absence of lung [part of]: Secondary | ICD-10-CM | POA: Diagnosis not present

## 2018-03-23 DIAGNOSIS — R1312 Dysphagia, oropharyngeal phase: Secondary | ICD-10-CM | POA: Diagnosis not present

## 2018-03-23 DIAGNOSIS — R2681 Unsteadiness on feet: Secondary | ICD-10-CM | POA: Diagnosis not present

## 2018-03-23 DIAGNOSIS — Z7389 Other problems related to life management difficulty: Secondary | ICD-10-CM | POA: Diagnosis not present

## 2018-03-23 DIAGNOSIS — Z4889 Encounter for other specified surgical aftercare: Secondary | ICD-10-CM | POA: Diagnosis not present

## 2018-03-24 DIAGNOSIS — R2681 Unsteadiness on feet: Secondary | ICD-10-CM | POA: Diagnosis not present

## 2018-03-24 DIAGNOSIS — Z7389 Other problems related to life management difficulty: Secondary | ICD-10-CM | POA: Diagnosis not present

## 2018-03-24 DIAGNOSIS — R1312 Dysphagia, oropharyngeal phase: Secondary | ICD-10-CM | POA: Diagnosis not present

## 2018-03-24 DIAGNOSIS — Z4889 Encounter for other specified surgical aftercare: Secondary | ICD-10-CM | POA: Diagnosis not present

## 2018-03-24 DIAGNOSIS — Z902 Acquired absence of lung [part of]: Secondary | ICD-10-CM | POA: Diagnosis not present

## 2018-03-24 DIAGNOSIS — Z950 Presence of cardiac pacemaker: Secondary | ICD-10-CM | POA: Diagnosis not present

## 2018-03-29 ENCOUNTER — Encounter: Payer: Self-pay | Admitting: Nurse Practitioner

## 2018-03-29 ENCOUNTER — Non-Acute Institutional Stay: Payer: Medicare Other | Admitting: Nurse Practitioner

## 2018-03-29 DIAGNOSIS — J449 Chronic obstructive pulmonary disease, unspecified: Secondary | ICD-10-CM | POA: Insufficient documentation

## 2018-03-29 DIAGNOSIS — R5381 Other malaise: Secondary | ICD-10-CM

## 2018-03-29 DIAGNOSIS — J441 Chronic obstructive pulmonary disease with (acute) exacerbation: Secondary | ICD-10-CM | POA: Diagnosis not present

## 2018-03-29 DIAGNOSIS — R5382 Chronic fatigue, unspecified: Secondary | ICD-10-CM

## 2018-03-29 DIAGNOSIS — R05 Cough: Secondary | ICD-10-CM | POA: Diagnosis not present

## 2018-03-29 NOTE — Progress Notes (Signed)
Location:  [facility] Room Number: 657 Place of Service:  ALF [phone]) Provider:  Roman Sandall, ManXie  NP  Blanchie Serve, MD  Patient Care Team: Blanchie Serve, MD as PCP - General (Internal Medicine) Fanny Skates, MD as Consulting Physician (General Surgery) [name], [name], MD as Consulting Physician (Oncology) [name], MD as Consulting Physician (Radiation Oncology) Rockwell Germany, RN as Registered Nurse Mauro Kaufmann, RN as Registered Nurse Gentry Fitz, MD as Consulting Physician (Family Medicine) Howard Patton X, NP as Nurse Practitioner (Internal Medicine) Thompson Grayer, MD as Consulting Physician (Cardiology)  Extended Emergency Contact Information Primary Emergency Contact: Matthews,[name] Address: [address]          [place], [place] [address] [place] Mobile Phone: [phone] Relation: Daughter Secondary Emergency Contact: Clent Demark [place] Phone: [phone] Relation: Other  Code Status:  DNR Goals of care: Advanced Directive information Advanced Directives 03/22/2018  Does Patient Have a Medical Advance Directive? Yes  Type of Paramedic of [place];Out of facility DNR (pink MOST or yellow form)  Does patient want to make changes to medical advance directive? No - Patient declined  Copy of [place] in Chart? Yes  Would patient like information on creating a medical advance directive? -  Pre-existing out of facility DNR order (yellow form or pink MOST form) Yellow form placed in chart (order not valid for inpatient use)     Chief Complaint  Patient presents with  . Acute Visit    Patient c/o not feeling well, wheezing,    HPI:  Pt is a 84 y.o. female seen today for an acute visit for feeling tired, increased cough, phlegm production, wheezes, front mid chest discomfort when cough x 2 days. She is afebrile, no O2 desaturation. She has  history of COPD, chronic hacking cough occasionally. She is s/p left mastectomy, lung cancer surgery.  Chronic fatigue and malaise. The patient is on Prednisone 5mg  daily for temporal arteritis.    Past Medical History:  Diagnosis Date  . Asthma    "as a child"  . Breast cancer ([place]) 12/08/2014   Bilateral  . Cancer of central portion of female breast ([place]) 12/04/2014  . Cancer of central portion of female breast ([place]) 12/04/2014  . Complete heart block ([place]) 1999   s/p PPM by Dr Olevia Perches, most recent generator change 2009  . Complication of anesthesia   . COPD (chronic obstructive pulmonary disease) ([place])   . Dementia   . Diverticulosis   . DJD (degenerative joint disease)    RA  . Esophageal stricture   . Family history of adverse reaction to anesthesia    Daughter N/V  . GERD (gastroesophageal reflux disease)   . Giant cell arteritis ([place])   . Goiter   . Hemorrhoids   . Hiatal hernia   . IBS (irritable bowel syndrome)   . Paroxysmal atrial fibrillation ([place]) 12/17/2015   asymptomatic, <1%, determined on regular pacemaker interrogation  . Pneumonia   . PONV (postoperative nausea and vomiting)   . S/P radiation therapy 04/24/15 completed   T-11  . Wears glasses    Past Surgical History:  Procedure Laterality Date  . ANAL FISSURE REPAIR    . ARTERY BIOPSY Left 01/03/2013   Procedure: BIOPSY LEFT TEMPORAL ARTERY;  Surgeon: Ascencion Dike, MD;  Location: [facility];  Service: ENT;  Laterality: Left;  . BREAST BIOPSY Right   .  CHOLECYSTECTOMY    . COLONOSCOPY    . FRACTURE SURGERY     fx lt wrist  . HEMORRHOID SURGERY    . INSERT / REPLACE / [facility]   Gen change ([facility]) by Dr Olevia Perches 2009  . IR FLUORO GUIDE PORT INSERTION RIGHT  12/22/2017  . IR US GUIDE VASC ACCESS RIGHT  12/22/2017  . LUNG REMOVAL, PARTIAL  1950   left lower lobectomy for brochiectasis   . MASTECTOMY Left 02/08/2018  . MOUTH SURGERY     patient unaware  . RECTAL POLYPECTOMY    .  RENAL CYST EXCISION    . TONSILLECTOMY    . TOTAL MASTECTOMY Left 02/08/2018  . TOTAL MASTECTOMY Left 02/08/2018   Procedure: TOTAL MASTECTOMY LEFT;  Surgeon: Fanny Skates, MD;  Location: [facility];  Service: General;  Laterality: Left;    Allergies  Allergen Reactions  . Amoxicillin-Pot Clavulanate Other (See Comments)    Upset stomach Has patient had a PCN reaction causing immediate rash, facial/tongue/throat swelling, SOB or lightheadedness with hypotension: No Has patient had a PCN reaction causing severe rash involving mucus membranes or skin necrosis: No Has patient had a PCN reaction that required hospitalization: No Has patient had a PCN reaction occurring within the last 10 years: No If all of the above answers are "NO", then may proceed with Cephalosporin use.   Marland Kitchen Morphine And Related Nausea Only    Outpatient Encounter Medications as of 03/29/2018  Medication Sig  . alum & mag hydroxide-simeth (MAALOX/MYLANTA) 200-200-20 MG/5ML suspension Take 30 mLs by mouth every 4 (four) hours as needed for indigestion or heartburn.  . Calcium Carb-Cholecalciferol (CALTRATE 600+D3 PO) Take 1 tablet by mouth daily.  . diclofenac sodium (VOLTAREN) 1 % GEL Apply 2 g topically 3 (three) times daily as needed (APPLY TO SHOULDER AND LEFT KNEE  FOR PAIN).   Marland Kitchen everolimus (AFINITOR) 2.5 MG tablet Take 1 tablet (2.5 mg total) by mouth daily. Caution:chemotherapy.  Marland Kitchen exemestane (AROMASIN) 25 MG tablet Take 1 tablet (25 mg total) by mouth daily after breakfast.  . metoprolol succinate (TOPROL-XL) 50 MG 24 hr tablet Take 50 mg by mouth daily. Take with or immediately following a meal.  . predniSONE (DELTASONE) 5 MG tablet Take 5 mg by mouth daily with breakfast. Reported on 11/19/2015  . traMADol (ULTRAM) 50 MG tablet Take 50 mg by mouth every 6 (six) hours as needed (FOR PAIN).   . Turmeric 500 MG TABS Take 500 mg by mouth daily.   No facility-administered encounter medications on file as of 03/29/2018.      Review of Systems  Constitutional: Positive for activity change, appetite change and fatigue. Negative for chills, diaphoresis and fever.  HENT: Positive for hearing loss. Negative for congestion.   Respiratory: Positive for cough, chest tightness, shortness of breath and wheezing.   Cardiovascular: Negative for chest pain, palpitations and leg swelling.  Gastrointestinal: Negative for abdominal distention, abdominal pain, constipation, diarrhea, nausea and vomiting.       Upset stomach is better after Mylanta  Musculoskeletal: Positive for gait problem.  Skin: Negative for color change and pallor.  Neurological: Negative for dizziness, speech difficulty, weakness and headaches.       Memory lapses.   Psychiatric/Behavioral: Negative for agitation, behavioral problems, hallucinations and sleep disturbance. The patient is not nervous/anxious.        Crying upon my visit.     Immunization History  Administered Date(s) Administered  . Influenza Split 07/24/2011, 06/20/2012  .  Influenza Whole 10/19/2001, 07/25/2007, 08/02/2008, 07/18/2009, 07/10/2010  . Influenza,inj,Quad PF,6+ Mos 07/18/2013, 08/27/2014  . Influenza-Unspecified 07/31/2015, 08/19/2016, 08/09/2017  . Pneumococcal Conjugate-13 08/16/2017  . Pneumococcal Polysaccharide-23 10/19/1997, 01/17/2010  . Td 10/19/1996, 07/25/2007   Pertinent  Health Maintenance Due  Topic Date Due  . INFLUENZA VACCINE  05/19/2018  . DEXA SCAN  Completed  . PNA vac Low Risk Adult  Completed   Fall Risk  07/01/2017 02/22/2017 05/22/2015 03/22/2015 08/02/2014  Falls in the past year? No No No No No   Functional Status Survey:    Vitals:   03/29/18 1219  BP: (!) 154/84  Pulse: 92  Resp: 16  Temp: 98.8 F (37.1 C)  SpO2: 94%  Weight: 124 lb (56.2 kg)  Height: 5' (1.524 m)   Body mass index is 24.22 kg/m. Physical Exam  Constitutional: She is oriented to person, place, and time. She appears well-developed and well-nourished.  HENT:    Head: Normocephalic and atraumatic.  Eyes: Pupils are equal, round, and reactive to light. EOM are normal.  Neck: Normal range of motion. Neck supple.  Cardiovascular: Normal rate and regular rhythm.  Murmur heard. Pacemaker.   Pulmonary/Chest: She has wheezes. She has rales.  Diffused rhonchi.   Abdominal: Soft. Bowel sounds are normal.  Musculoskeletal: She exhibits no edema or tenderness.  Self transfer, ambulates with walker.   Neurological: She is alert and oriented to person, place, and time. No cranial nerve deficit. She exhibits normal muscle tone. Coordination normal.  Skin: Skin is warm and dry.  Left chest surgical scar of mastectomy. Posterior left chest surgical scar of lung cancer surgery.   Psychiatric: She has a normal mood and affect. Her behavior is normal. Judgment and thought content normal.    Labs reviewed: Recent Labs    02/03/18 1344  02/09/18 0456 02/17/18 03/04/18 1034  NA 135  --  135 136 138  K 4.2  --  4.0 4.1 3.5  CL 101  --  102  --  101  CO2 24  --  23  --  28  GLUCOSE 203*  --  237*  --  103  BUN 15  --  11 11 9   CREATININE 0.85   < > 0.87 0.81 0.93  CALCIUM 9.3  --  8.5* 9.0 9.8   < > = values in this interval not displayed.   Recent Labs    01/07/18 0959 02/03/18 1344 02/17/18 03/04/18 1034  AST 35* 45* 37 37*  ALT 33 45 32 35  ALKPHOS 93 92 71 92  BILITOT 0.7 0.8 0.7 0.6  PROT 6.7 6.3*  --  6.6  ALBUMIN 3.3* 3.4* 3.7 3.3*   Recent Labs    01/07/18 0959 02/03/18 1344 02/08/18 1516 02/09/18 0456 03/04/18 1034  WBC 7.1 7.1 6.5 7.7 5.8  NEUTROABS 5.2 6.0  --   --  4.0  HGB 12.6 12.6 12.4 11.2* 12.1  HCT 39.5 39.5 37.8 35.6* 37.1  MCV 91.4 89.4 87.3 87.5 85.9  PLT 225 225 205 222 198   Lab Results  Component Value Date   TSH 1.07 08/31/2017   Lab Results  Component Value Date   HGBA1C 6.1 (H) 02/09/2018   Lab Results  Component Value Date   CHOL 194 07/25/2007   HDL 62.7 07/25/2007   LDLCALC 118 (H) 07/25/2007    TRIG 69 07/25/2007   CHOLHDL 3.1 CALC 07/25/2007    Significant Diagnostic Results in last 30 days:  No results found.  Assessment/Plan  COPD with acute exacerbation (HCC) feeling tired, increased cough, phlegm production, wheezes, front mid chest discomfort when cough x 2 days. She is afebrile, no O2 desaturation. She has history of COPD, chronic hacking cough occasionally. Will obtain CXR ap and lateral views, CBC/diff, CMP. Start DuoNeb q8h x 72 hours, VS+Sat O2 q shift x 72 hours, Mucinex 600mg  bid x 5 days. Observe.   Chronic fatigue and malaise Worse, supportive care.      Family/ staff Communication: plan of care reviewed with the patient and charge nurse.   Labs/tests ordered:  CXR ap and lateral views, CBC/diff, CMP  Time spend 25 minutes.

## 2018-03-29 NOTE — Assessment & Plan Note (Signed)
feeling tired, increased cough, phlegm production, wheezes, front mid chest discomfort when cough x 2 days. She is afebrile, no O2 desaturation. She has history of COPD, chronic hacking cough occasionally. Will obtain CXR ap and lateral views, CBC/diff, CMP. Start DuoNeb q8h x 72 hours, VS+Sat O2 q shift x 72 hours, Mucinex 600mg  bid x 5 days. Observe.

## 2018-03-29 NOTE — Assessment & Plan Note (Signed)
Worse, supportive care.

## 2018-03-30 ENCOUNTER — Emergency Department (HOSPITAL_COMMUNITY): Payer: Medicare Other

## 2018-03-30 ENCOUNTER — Other Ambulatory Visit: Payer: Self-pay | Admitting: Oncology

## 2018-03-30 ENCOUNTER — Inpatient Hospital Stay (HOSPITAL_COMMUNITY)
Admission: EM | Admit: 2018-03-30 | Discharge: 2018-04-01 | DRG: 205 | Disposition: A | Discharge status: Home / Self Care | Payer: Medicare Other | Attending: Internal Medicine | Admitting: Internal Medicine

## 2018-03-30 ENCOUNTER — Encounter (HOSPITAL_COMMUNITY): Payer: Self-pay | Admitting: *Deleted

## 2018-03-30 ENCOUNTER — Other Ambulatory Visit: Payer: Self-pay

## 2018-03-30 DIAGNOSIS — E876 Hypokalemia: Secondary | ICD-10-CM | POA: Diagnosis not present

## 2018-03-30 DIAGNOSIS — K219 Gastro-esophageal reflux disease without esophagitis: Secondary | ICD-10-CM | POA: Diagnosis present

## 2018-03-30 DIAGNOSIS — J704 Drug-induced interstitial lung disorders, unspecified: Secondary | ICD-10-CM | POA: Diagnosis not present

## 2018-03-30 DIAGNOSIS — I10 Essential (primary) hypertension: Secondary | ICD-10-CM

## 2018-03-30 DIAGNOSIS — Z881 Allergy status to other antibiotic agents status: Secondary | ICD-10-CM

## 2018-03-30 DIAGNOSIS — M810 Age-related osteoporosis without current pathological fracture: Secondary | ICD-10-CM | POA: Diagnosis present

## 2018-03-30 DIAGNOSIS — C50111 Malignant neoplasm of central portion of right female breast: Secondary | ICD-10-CM | POA: Diagnosis present

## 2018-03-30 DIAGNOSIS — C50919 Malignant neoplasm of unspecified site of unspecified female breast: Secondary | ICD-10-CM | POA: Diagnosis not present

## 2018-03-30 DIAGNOSIS — Z8349 Family history of other endocrine, nutritional and metabolic diseases: Secondary | ICD-10-CM

## 2018-03-30 DIAGNOSIS — Z923 Personal history of irradiation: Secondary | ICD-10-CM

## 2018-03-30 DIAGNOSIS — G9341 Metabolic encephalopathy: Secondary | ICD-10-CM | POA: Diagnosis not present

## 2018-03-30 DIAGNOSIS — Z800 Family history of malignant neoplasm of digestive organs: Secondary | ICD-10-CM

## 2018-03-30 DIAGNOSIS — Z8249 Family history of ischemic heart disease and other diseases of the circulatory system: Secondary | ICD-10-CM | POA: Diagnosis not present

## 2018-03-30 DIAGNOSIS — Z9049 Acquired absence of other specified parts of digestive tract: Secondary | ICD-10-CM

## 2018-03-30 DIAGNOSIS — Z9012 Acquired absence of left breast and nipple: Secondary | ICD-10-CM | POA: Diagnosis not present

## 2018-03-30 DIAGNOSIS — T451X5A Adverse effect of antineoplastic and immunosuppressive drugs, initial encounter: Secondary | ICD-10-CM | POA: Diagnosis present

## 2018-03-30 DIAGNOSIS — J069 Acute upper respiratory infection, unspecified: Secondary | ICD-10-CM | POA: Diagnosis not present

## 2018-03-30 DIAGNOSIS — R531 Weakness: Secondary | ICD-10-CM | POA: Diagnosis not present

## 2018-03-30 DIAGNOSIS — Z811 Family history of alcohol abuse and dependence: Secondary | ICD-10-CM | POA: Diagnosis not present

## 2018-03-30 DIAGNOSIS — C50812 Malignant neoplasm of overlapping sites of left female breast: Secondary | ICD-10-CM | POA: Diagnosis present

## 2018-03-30 DIAGNOSIS — J441 Chronic obstructive pulmonary disease with (acute) exacerbation: Secondary | ICD-10-CM | POA: Diagnosis present

## 2018-03-30 DIAGNOSIS — Z803 Family history of malignant neoplasm of breast: Secondary | ICD-10-CM

## 2018-03-30 DIAGNOSIS — I442 Atrioventricular block, complete: Secondary | ICD-10-CM | POA: Diagnosis present

## 2018-03-30 DIAGNOSIS — R05 Cough: Secondary | ICD-10-CM | POA: Diagnosis not present

## 2018-03-30 DIAGNOSIS — I480 Paroxysmal atrial fibrillation: Secondary | ICD-10-CM | POA: Diagnosis present

## 2018-03-30 DIAGNOSIS — Z885 Allergy status to narcotic agent status: Secondary | ICD-10-CM

## 2018-03-30 DIAGNOSIS — C50811 Malignant neoplasm of overlapping sites of right female breast: Secondary | ICD-10-CM | POA: Diagnosis present

## 2018-03-30 DIAGNOSIS — R1310 Dysphagia, unspecified: Secondary | ICD-10-CM | POA: Diagnosis present

## 2018-03-30 DIAGNOSIS — J189 Pneumonia, unspecified organism: Secondary | ICD-10-CM | POA: Diagnosis present

## 2018-03-30 DIAGNOSIS — K580 Irritable bowel syndrome with diarrhea: Secondary | ICD-10-CM | POA: Diagnosis present

## 2018-03-30 DIAGNOSIS — E871 Hypo-osmolality and hyponatremia: Secondary | ICD-10-CM | POA: Diagnosis not present

## 2018-03-30 DIAGNOSIS — C7951 Secondary malignant neoplasm of bone: Secondary | ICD-10-CM | POA: Diagnosis not present

## 2018-03-30 DIAGNOSIS — I959 Hypotension, unspecified: Secondary | ICD-10-CM | POA: Diagnosis not present

## 2018-03-30 DIAGNOSIS — M1990 Unspecified osteoarthritis, unspecified site: Secondary | ICD-10-CM | POA: Diagnosis present

## 2018-03-30 DIAGNOSIS — C50911 Malignant neoplasm of unspecified site of right female breast: Secondary | ICD-10-CM | POA: Diagnosis not present

## 2018-03-30 DIAGNOSIS — I4891 Unspecified atrial fibrillation: Secondary | ICD-10-CM | POA: Diagnosis not present

## 2018-03-30 DIAGNOSIS — R0902 Hypoxemia: Secondary | ICD-10-CM | POA: Diagnosis not present

## 2018-03-30 DIAGNOSIS — K5790 Diverticulosis of intestine, part unspecified, without perforation or abscess without bleeding: Secondary | ICD-10-CM | POA: Diagnosis present

## 2018-03-30 DIAGNOSIS — E860 Dehydration: Secondary | ICD-10-CM | POA: Diagnosis present

## 2018-03-30 DIAGNOSIS — R062 Wheezing: Secondary | ICD-10-CM | POA: Diagnosis not present

## 2018-03-30 LAB — URINALYSIS, ROUTINE W REFLEX MICROSCOPIC
BILIRUBIN URINE: NEGATIVE
GLUCOSE, UA: NEGATIVE mg/dL
Hgb urine dipstick: NEGATIVE
KETONES UR: 5 mg/dL — AB
LEUKOCYTES UA: NEGATIVE
Nitrite: NEGATIVE
PROTEIN: NEGATIVE mg/dL
Specific Gravity, Urine: 1.002 — ABNORMAL LOW (ref 1.005–1.030)
pH: 7 (ref 5.0–8.0)

## 2018-03-30 LAB — PROTIME-INR
INR: 0.98
Prothrombin Time: 12.9 seconds (ref 11.4–15.2)

## 2018-03-30 LAB — CBC WITH DIFFERENTIAL/PLATELET
BASOS ABS: 0 10*3/uL (ref 0.0–0.1)
Basophils Relative: 0 %
Eosinophils Absolute: 0.1 10*3/uL (ref 0.0–0.7)
Eosinophils Relative: 2 %
HCT: 36.4 % (ref 36.0–46.0)
Hemoglobin: 12.3 g/dL (ref 12.0–15.0)
LYMPHS ABS: 1.3 10*3/uL (ref 0.7–4.0)
LYMPHS PCT: 15 %
MCH: 27.6 pg (ref 26.0–34.0)
MCHC: 33.8 g/dL (ref 30.0–36.0)
MCV: 81.8 fL (ref 78.0–100.0)
Monocytes Absolute: 1.2 10*3/uL — ABNORMAL HIGH (ref 0.1–1.0)
Monocytes Relative: 14 %
NEUTROS PCT: 69 %
Neutro Abs: 6.1 10*3/uL (ref 1.7–7.7)
Platelets: 188 10*3/uL (ref 150–400)
RBC: 4.45 MIL/uL (ref 3.87–5.11)
RDW: 14.4 % (ref 11.5–15.5)
WBC: 8.8 10*3/uL (ref 4.0–10.5)

## 2018-03-30 LAB — COMPREHENSIVE METABOLIC PANEL
ALT: 34 U/L (ref 14–54)
AST: 46 U/L — AB (ref 15–41)
Albumin: 3 g/dL — ABNORMAL LOW (ref 3.5–5.0)
Alkaline Phosphatase: 69 U/L (ref 38–126)
Anion gap: 9 (ref 5–15)
BUN: 5 mg/dL — ABNORMAL LOW (ref 6–20)
CHLORIDE: 93 mmol/L — AB (ref 101–111)
CO2: 26 mmol/L (ref 22–32)
Calcium: 8.4 mg/dL — ABNORMAL LOW (ref 8.9–10.3)
Creatinine, Ser: 0.64 mg/dL (ref 0.44–1.00)
Glucose, Bld: 107 mg/dL — ABNORMAL HIGH (ref 65–99)
POTASSIUM: 3 mmol/L — AB (ref 3.5–5.1)
Sodium: 128 mmol/L — ABNORMAL LOW (ref 135–145)
TOTAL PROTEIN: 6.1 g/dL — AB (ref 6.5–8.1)
Total Bilirubin: 0.7 mg/dL (ref 0.3–1.2)

## 2018-03-30 LAB — CBC AND DIFFERENTIAL
HCT: 34 — AB (ref 36–46)
Hemoglobin: 11.6 — AB (ref 12.0–16.0)
Platelets: 214 (ref 150–399)
WBC: 8.3

## 2018-03-30 LAB — BRAIN NATRIURETIC PEPTIDE: B NATRIURETIC PEPTIDE 5: 136.5 pg/mL — AB (ref 0.0–100.0)

## 2018-03-30 LAB — HEPATIC FUNCTION PANEL
ALT: 27 (ref 7–35)
AST: 38 — AB (ref 13–35)
Alkaline Phosphatase: 76 (ref 25–125)
Bilirubin, Total: 0.6

## 2018-03-30 LAB — BASIC METABOLIC PANEL
BUN: 6 (ref 4–21)
CREATININE: 0.7 (ref <=1.1)
Glucose: 167
Potassium: 3.7 (ref 3.4–5.3)
Sodium: 127 — AB (ref 137–147)

## 2018-03-30 LAB — I-STAT TROPONIN, ED: TROPONIN I, POC: 0.03 ng/mL (ref 0.00–0.08)

## 2018-03-30 LAB — I-STAT CG4 LACTIC ACID, ED: LACTIC ACID, VENOUS: 1.07 mmol/L (ref 0.5–1.9)

## 2018-03-30 MED ORDER — PREDNISONE 20 MG PO TABS
10.0000 mg | ORAL_TABLET | Freq: Every day | ORAL | Status: DC
  Administered 2018-03-31: 10 mg via ORAL
  Filled 2018-03-30: qty 1

## 2018-03-30 MED ORDER — METOPROLOL TARTRATE 5 MG/5ML IV SOLN
2.5000 mg | Freq: Once | INTRAVENOUS | Status: AC
  Administered 2018-03-30: 2.5 mg via INTRAVENOUS
  Filled 2018-03-30: qty 5

## 2018-03-30 MED ORDER — METOPROLOL SUCCINATE ER 50 MG PO TB24
50.0000 mg | ORAL_TABLET | Freq: Every day | ORAL | Status: DC
  Administered 2018-03-30 – 2018-04-01 (×3): 50 mg via ORAL
  Filled 2018-03-30 (×3): qty 1

## 2018-03-30 MED ORDER — POTASSIUM CHLORIDE 10 MEQ/100ML IV SOLN
10.0000 meq | Freq: Once | INTRAVENOUS | Status: AC
  Administered 2018-03-30: 10 meq via INTRAVENOUS
  Filled 2018-03-30: qty 100

## 2018-03-30 MED ORDER — POTASSIUM CHLORIDE IN NACL 20-0.9 MEQ/L-% IV SOLN
INTRAVENOUS | Status: DC
  Administered 2018-03-30 – 2018-04-01 (×3): via INTRAVENOUS
  Filled 2018-03-30 (×4): qty 1000

## 2018-03-30 MED ORDER — SODIUM CHLORIDE 0.9 % IV SOLN
Freq: Once | INTRAVENOUS | Status: AC
  Administered 2018-03-30: 15:00:00 via INTRAVENOUS

## 2018-03-30 MED ORDER — IOPAMIDOL (ISOVUE-370) INJECTION 76%
100.0000 mL | Freq: Once | INTRAVENOUS | Status: AC | PRN
Start: 2018-03-30 — End: 2018-03-30
  Administered 2018-03-30: 100 mL via INTRAVENOUS

## 2018-03-30 MED ORDER — IOPAMIDOL (ISOVUE-370) INJECTION 76%
INTRAVENOUS | Status: AC
  Filled 2018-03-30: qty 100

## 2018-03-30 MED ORDER — POTASSIUM CHLORIDE 10 MEQ/100ML IV SOLN
10.0000 meq | INTRAVENOUS | Status: AC
  Administered 2018-03-30 (×2): 10 meq via INTRAVENOUS
  Filled 2018-03-30 (×2): qty 100

## 2018-03-30 MED ORDER — SODIUM CHLORIDE 0.9% FLUSH
10.0000 mL | INTRAVENOUS | Status: DC | PRN
  Administered 2018-04-01: 10 mL
  Filled 2018-03-30: qty 40

## 2018-03-30 MED ORDER — ALUM & MAG HYDROXIDE-SIMETH 200-200-20 MG/5ML PO SUSP
30.0000 mL | ORAL | Status: DC | PRN
Start: 2018-03-30 — End: 2018-04-01
  Administered 2018-04-01: 30 mL via ORAL
  Filled 2018-03-30: qty 30

## 2018-03-30 MED ORDER — EXEMESTANE 25 MG PO TABS
25.0000 mg | ORAL_TABLET | Freq: Every day | ORAL | Status: DC
  Administered 2018-03-31 – 2018-04-01 (×2): 25 mg via ORAL
  Filled 2018-03-30 (×2): qty 1

## 2018-03-30 MED ORDER — ENOXAPARIN SODIUM 40 MG/0.4ML ~~LOC~~ SOLN
40.0000 mg | SUBCUTANEOUS | Status: DC
  Administered 2018-03-30 – 2018-03-31 (×2): 40 mg via SUBCUTANEOUS
  Filled 2018-03-30 (×2): qty 0.4

## 2018-03-30 MED ORDER — POTASSIUM CHLORIDE CRYS ER 20 MEQ PO TBCR
40.0000 meq | EXTENDED_RELEASE_TABLET | Freq: Once | ORAL | Status: AC
  Administered 2018-03-30: 40 meq via ORAL
  Filled 2018-03-30: qty 2

## 2018-03-30 MED ORDER — DEXAMETHASONE SODIUM PHOSPHATE 10 MG/ML IJ SOLN
10.0000 mg | Freq: Once | INTRAMUSCULAR | Status: AC
  Administered 2018-03-30: 10 mg via INTRAVENOUS
  Filled 2018-03-30: qty 1

## 2018-03-30 MED ORDER — FAMOTIDINE IN NACL 20-0.9 MG/50ML-% IV SOLN
20.0000 mg | Freq: Two times a day (BID) | INTRAVENOUS | Status: DC
  Administered 2018-03-30 – 2018-04-01 (×4): 20 mg via INTRAVENOUS
  Filled 2018-03-30 (×4): qty 50

## 2018-03-30 MED ORDER — FAMOTIDINE IN NACL 20-0.9 MG/50ML-% IV SOLN
20.0000 mg | Freq: Once | INTRAVENOUS | Status: AC
  Administered 2018-03-30: 20 mg via INTRAVENOUS
  Filled 2018-03-30: qty 50

## 2018-03-30 MED ORDER — SODIUM CHLORIDE 0.9 % IV BOLUS
500.0000 mL | Freq: Once | INTRAVENOUS | Status: AC
  Administered 2018-03-30: 500 mL via INTRAVENOUS

## 2018-03-30 MED ORDER — TRAMADOL HCL 50 MG PO TABS: 50.0000 mg | ORAL_TABLET | Freq: Four times a day (QID) | ORAL | Status: DC | PRN

## 2018-03-30 NOTE — Progress Notes (Unsigned)
.  He has shown up in the emergency room with some pulmonary problems which hopefully will not be a pulmonary embolus or major issue, but may well be pneumonitis secondary to her everolimus.  I have discussed that with her ED physician Dr. Vallery Ridge and if that is what is going on of course we will stop the everolimus and she will receive steroids to reverse the problem.  I am going to make sure that we see her next week for follow-up.

## 2018-03-30 NOTE — ED Notes (Signed)
ED TO INPATIENT HANDOFF REPORT  Name/Age/Gender Lauren Buchanan 84 y.o. female  Code Status    Code Status Orders  (From admission, onward)        Start     Ordered   03/30/18 1734  Full code  Continuous     03/30/18 1733    Code Status History    Date Active Date Inactive Code Status Order ID Comments User Context   02/08/2018 1440 02/11/2018 1854 Full Code [id number]  Fanny Skates, MD Inpatient   03/07/2015 1235 03/08/2015 0345 Full Code [id number]  Jacqulynn Cadet, MD HOV   02/27/2013 2214 03/03/2013 1811 Full Code [id number]  Rise Patience, MD Inpatient    Advance Directive Documentation     Most Recent Value  Type of Advance Directive  Healthcare Power of Attorney, Out of facility DNR (pink MOST or yellow form)  Pre-existing out of facility DNR order (yellow form or pink MOST form)  Yellow form placed in chart (order not valid for inpatient use)  "MOST" Form in Place?  -      Home/SNF/Other Rehab  Chief Complaint weakness   Level of Care/Admitting Diagnosis ED Disposition    ED Disposition Condition [facility]: [facility] [[id number]]  Level of Care: Telemetry [5]  Diagnosis: Pneumonitis [858850]  Admitting Physician: Georgette Shell [[id number]]  Attending Physician: Georgette Shell [[id number]]  Estimated length of stay: 3 - 4 days  Certification:: I certify this patient will need inpatient services for at least 2 midnights  PT Class (Do Not Modify): Inpatient [101]  PT Acc Code (Do Not Modify): Private [1]       Medical History Past Medical History:  Diagnosis Date  . Asthma    "as a child"  . Breast cancer ([facility]) 12/08/2014   Bilateral  . Cancer of central portion of female breast ([place]) 12/04/2014  . Cancer of central portion of female breast ([place] [facility]) 12/04/2014  . Complete heart block ([place]) 1999   s/p PPM by Dr Olevia Perches, most recent generator change 2009  . Complication of anesthesia   . COPD (chronic  obstructive pulmonary disease) ([facility])   . Dementia   . Diverticulosis   . DJD (degenerative joint disease)    RA  . Esophageal stricture   . Family history of adverse reaction to anesthesia    Daughter N/V  . GERD (gastroesophageal reflux disease)   . Giant cell arteritis ([place] [place])   . Goiter   . Hemorrhoids   . Hiatal hernia   . IBS (irritable bowel syndrome)   . Paroxysmal atrial fibrillation ([place]) 12/17/2015   asymptomatic, <1%, determined on regular pacemaker interrogation  . Pneumonia   . PONV (postoperative nausea and vomiting)   . S/P radiation therapy 04/24/15 completed   T-11  . Wears glasses     Allergies Allergies  Allergen Reactions  . Amoxicillin-Pot Clavulanate Other (See Comments)    Upset stomach Has patient had a PCN reaction causing immediate rash, facial/tongue/throat swelling, SOB or lightheadedness with hypotension: No Has patient had a PCN reaction causing severe rash involving mucus membranes or skin necrosis: No Has patient had a PCN reaction that required hospitalization: No Has patient had a PCN reaction occurring within the last 10 years: No If all of the above answers are "NO", then may proceed with Cephalosporin use.   Marland Kitchen Morphine And Related Nausea Only    IV Location/Drains/Wounds Patient Lines/Drains/Airways Status   Active Line/Drains/Airways    Name:  Placement date:   Placement time:   Site:   Days:   Implanted Port 12/22/17 Right Chest   12/22/17    1235    Chest   98          Labs/Imaging Results for orders placed or performed during the hospital encounter of 03/30/18 (from the past 48 hour(s))  Urinalysis, Routine w reflex microscopic     Status: Abnormal   Collection Time: 03/30/18 12:46 PM  Result Value Ref Range   Color, Urine STRAW (A) YELLOW   APPearance CLEAR CLEAR   Specific Gravity, Urine 1.002 (L) 1.005 - 1.030   pH 7.0 5.0 - 8.0   Glucose, UA NEGATIVE NEGATIVE mg/dL   Hgb urine dipstick NEGATIVE NEGATIVE   Bilirubin  Urine NEGATIVE NEGATIVE   Ketones, ur 5 (A) NEGATIVE mg/dL   Protein, ur NEGATIVE NEGATIVE mg/dL   Nitrite NEGATIVE NEGATIVE   Leukocytes, UA NEGATIVE NEGATIVE    Comment: Performed at [facility] [address] [place], [place] [address]  Comprehensive metabolic panel     Status: Abnormal   Collection Time: 03/30/18  1:27 PM  Result Value Ref Range   Sodium 128 (L) 135 - 145 mmol/L   Potassium 3.0 (L) 3.5 - 5.1 mmol/L   Chloride 93 (L) 101 - 111 mmol/L   CO2 26 22 - 32 mmol/L   Glucose, Bld 107 (H) 65 - 99 mg/dL   BUN 5 (L) 6 - 20 mg/dL   Creatinine, Ser 0.64 0.44 - 1.00 mg/dL   Calcium 8.4 (L) 8.9 - 10.3 mg/dL   Total Protein 6.1 (L) 6.5 - 8.1 g/dL   Albumin 3.0 (L) 3.5 - 5.0 g/dL   AST 46 (H) 15 - 41 U/L   ALT 34 14 - 54 U/L   Alkaline Phosphatase 69 38 - 126 U/L   Total Bilirubin 0.7 0.3 - 1.2 mg/dL   GFR calc non Af Amer >60 >60 mL/min   GFR calc Af Amer >60 >60 mL/min    Comment: (NOTE) The eGFR has been calculated using the CKD EPI equation. This calculation has not been validated in all clinical situations. eGFR's persistently <60 mL/min signify possible Chronic Kidney Disease.    Anion gap 9 5 - 15    Comment: Performed at [facility], [place] [address], [place], [place] [address]  CBC with Differential     Status: Abnormal   Collection Time: 03/30/18  1:27 PM  Result Value Ref Range   WBC 8.8 4.0 - 10.5 K/uL   RBC 4.45 3.87 - 5.11 MIL/uL   Hemoglobin 12.3 12.0 - 15.0 g/dL   HCT 36.4 36.0 - 46.0 %   MCV 81.8 78.0 - 100.0 fL   MCH 27.6 26.0 - 34.0 pg   MCHC 33.8 30.0 - 36.0 g/dL   RDW 14.4 11.5 - 15.5 %   Platelets 188 150 - 400 K/uL   Neutrophils Relative % 69 %   Neutro Abs 6.1 1.7 - 7.7 K/uL   Lymphocytes Relative 15 %   Lymphs Abs 1.3 0.7 - 4.0 K/uL   Monocytes Relative 14 %   Monocytes Absolute 1.2 (H) 0.1 - 1.0 K/uL   Eosinophils Relative 2 %   Eosinophils Absolute 0.1 0.0 - 0.7 K/uL   Basophils Relative 0 %    Basophils Absolute 0.0 0.0 - 0.1 K/uL    Comment: Performed at [facility], [place] [address] [place], [place] [address]  Protime-INR  Status: None   Collection Time: 03/30/18  1:27 PM  Result Value Ref Range   Prothrombin Time 12.9 11.4 - 15.2 seconds   INR 0.98     Comment: Performed at [facility], [place] [address], [place], [place] [address]  Brain natriuretic peptide     Status: Abnormal   Collection Time: 03/30/18  1:27 PM  Result Value Ref Range   B Natriuretic Peptide 136.5 (H) 0.0 - 100.0 pg/mL    Comment: Performed at [facility], [place] [address] [place], [place] [address]  I-stat troponin, ED     Status: None   Collection Time: 03/30/18  1:31 PM  Result Value Ref Range   Troponin i, poc 0.03 0.00 - 0.08 ng/mL   Comment 3            Comment: Due to the release kinetics of cTnI, a negative result within the first hours of the onset of symptoms does not rule out myocardial infarction with certainty. If myocardial infarction is still suspected, repeat the test at appropriate intervals.   I-Stat CG4 Lactic Acid, ED     Status: None   Collection Time: 03/30/18  1:34 PM  Result Value Ref Range   Lactic Acid, Venous 1.07 0.5 - 1.9 mmol/L   Dg Chest 2 View  Result Date: 03/30/2018 CLINICAL DATA:  Chest tightness with cough. EXAM: CHEST - 2 VIEW COMPARISON:  PET-CT 10/15/2017.  Chest x-ray 02/27/2013 FINDINGS: 1151 hours. Lungs are hyperexpanded. The lungs are clear without focal pneumonia, edema, pneumothorax or pleural effusion. Interstitial markings are diffusely coarsened with chronic features. Left suprahilar lesion compatible with the abnormality seen on previous PET-CT. Calcified granuloma noted in the medial parahilar right lung. The cardiopericardial silhouette is within normal limits for size. Left permanent pacemaker again noted. Telemetry leads overlie the chest. IMPRESSION: 1. Hyperexpansion without acute  cardiopulmonary findings. 2. Left suprahilar opacity compatible with left upper lobe lesion seen on previous PET-CT. Electronically Signed   By: Misty Stanley M.D.   On: 03/30/2018 12:41   Ct Angio Chest Pe W/cm &/or Wo Cm  Result Date: 03/30/2018 CLINICAL DATA:  Upper respiratory infection.  Coughing.  Wheezing. EXAM: CT ANGIOGRAPHY CHEST WITH CONTRAST TECHNIQUE: Multidetector CT imaging of the chest was performed using the standard protocol during bolus administration of intravenous contrast. Multiplanar CT image reconstructions and MIPs were obtained to evaluate the vascular anatomy. CONTRAST:  179m ISOVUE-370 IOPAMIDOL (ISOVUE-370) INJECTION 76% COMPARISON:  PET-CT 10/15/2017 FINDINGS: Cardiovascular: LEFT-sided pacemaker. No significant vascular findings. Normal heart size. No pericardial effusion. Port in the anterior chest wall with tip in distal SVC. Mediastinum/Nodes: No axillary or supraclavicular adenopathy. Nodular enlargement of the LEFT lobe of the thyroid gland nodules measure 2 cm. These nodules were not hypermetabolic comparison PET-CT scan. No mediastinal hilar adenopathy.  Small hiatal hernia. Lungs/Pleura: Small bilateral pleural effusions. Calcified scar in the LEFT lower lobe following LEFT lower lobe lobectomy. Calcified granuloma in the RIGHT upper lobe. No suspicious pulmonary nodules. A ground-glass opacity in the superior segment of the posterior LEFT upper lobe measures 3.8 by 2.2 cm unchanged (image 41/10. This mixed density nodule was mildly hypermetabolic on comparison PET-CT scan. Upper Abdomen: Limited view of the liver, kidneys, pancreas are unremarkable. Normal adrenal glands. Musculoskeletal: Stable sclerotic lesion in the T9 vertebral body. Seroma in the LEFT breast at mastectomy site. Review of the MIP images confirms the above findings. IMPRESSION: 1. Elongated sub solid lesion in the LEFT upper lobe unchanged from  comparison PET and CT scan. 2. Bilateral small effusions.  3. Small hiatal hernia. Electronically Signed   By: Suzy Bouchard M.D.   On: 03/30/2018 16:16    Pending Labs Unresulted Labs (From admission, onward)   Start     Ordered   03/31/18 0500  TSH  Tomorrow morning,   R     03/30/18 1735   03/31/18 0500  CBC  Daily,   R     03/30/18 1736   03/31/18 6578  Basic metabolic panel  Daily,   R     03/30/18 1736   03/31/18 0500  Magnesium  Tomorrow morning,   R     03/30/18 1736   03/30/18 1253  Culture, blood (routine x 2)  BLOOD CULTURE X 2,   STAT     03/30/18 1253      Vitals/Pain Today's Vitals   03/30/18 1400 03/30/18 1442 03/30/18 1521 03/30/18 1742  BP: (!) 159/78  (!) 155/101 (!) 180/88  Pulse: (!) 109  95 91  Resp: 20  (!) 25 19  Temp:  98.3 F (36.8 C)    TempSrc:  Oral    SpO2: 98%  94% 97%  Weight:      PainSc:        Isolation Precautions No active isolations  Medications Medications  potassium chloride 10 mEq in 100 mL IVPB (10 mEq Intravenous Transfusing/Transfer 03/30/18 1812)  iopamidol (ISOVUE-370) 76 % injection (has no administration in time range)  alum & mag hydroxide-simeth (MAALOX/MYLANTA) 200-200-20 MG/5ML suspension 30 mL (has no administration in time range)  exemestane (AROMASIN) tablet 25 mg (has no administration in time range)  metoprolol succinate (TOPROL-XL) 24 hr tablet 50 mg (has no administration in time range)  traMADol (ULTRAM) tablet 50 mg (has no administration in time range)  enoxaparin (LOVENOX) injection 40 mg (has no administration in time range)  dexamethasone (DECADRON) injection 10 mg (has no administration in time range)  famotidine (PEPCID) IVPB 20 mg premix (has no administration in time range)  potassium chloride 10 mEq in 100 mL IVPB (has no administration in time range)  0.9 % NaCl with KCl 20 mEq/ L  infusion (has no administration in time range)  predniSONE (DELTASONE) tablet 10 mg (has no administration in time range)  sodium chloride 0.9 % bolus 500 mL (0 mLs  Intravenous Stopped 03/30/18 1535)  0.9 %  sodium chloride infusion ( Intravenous Transfusing/Transfer 03/30/18 1814)  metoprolol tartrate (LOPRESSOR) injection 2.5 mg (2.5 mg Intravenous Given 03/30/18 1517)  potassium chloride SA (K-DUR,KLOR-CON) CR tablet 40 mEq (40 mEq Oral Given 03/30/18 1644)  famotidine (PEPCID) IVPB 20 mg premix (0 mg Intravenous Stopped 03/30/18 1715)  iopamidol (ISOVUE-370) 76 % injection 100 mL (100 mLs Intravenous Contrast Given 03/30/18 1539)    Mobility walks with device

## 2018-03-30 NOTE — ED Provider Notes (Signed)
[facility] DEPT Provider Note   CSN: [id number] Arrival date & time: 03/30/18  1110     History   Chief Complaint Chief Complaint  Patient presents with  . Illness    HPI Lauren Buchanan is a 84 y.o. female.  HPI Patient has had recurrence of breast cancer.  She is currently getting treatment with Everolimus and had left mastectomy that is healing well.  She does as well have right breast cancer but they are opting not to treat surgically.  Patient has had a harsh cough for about a week.  Cough came first and was quite intense.  Her daughter reports it sounded like she would bring up some mucus but she never did.  The patient reports that she later started developing some central chest pain with coughing.  She has not had a documented fever.  Her daughter reports her maximum temperature is 100.3 yesterday.  For about a week she has been treated as a bronchitis but not getting any better.  Patient has gotten increasingly weak and fatigued.  Labs were drawn at her assisted living today but they do not have results yet.  She had a chest x-ray last week negative for pneumonia.  Her daughter is aware of the possibility of Everolimus side effect.  Dr. Jana Hakim called to advise me that there was possibility of a pneumonitis but to proceed with ruling out any other causes by CT as well.  Recommendation for pneumonitis associated is Decadron 10 mg IV and then continued Medrol Dosepak. Past Medical History:  Diagnosis Date  . Asthma    "as a child"  . Breast cancer ([place]) 12/08/2014   Bilateral  . Cancer of central portion of female breast ([place]) 12/04/2014  . Cancer of central portion of female breast ([place]) 12/04/2014  . Complete heart block ([place]) 1999   s/p PPM by Dr Olevia Perches, most recent generator change 2009  . Complication of anesthesia   . COPD (chronic obstructive pulmonary disease) ([place])   . Dementia   . Diverticulosis   . DJD (degenerative joint disease)    RA  . Esophageal stricture   . Family history of adverse reaction to anesthesia    Daughter N/V  . GERD (gastroesophageal reflux disease)   . Giant cell arteritis ([place])   . Goiter   . Hemorrhoids   . Hiatal hernia   . IBS (irritable bowel syndrome)   . Paroxysmal atrial fibrillation ([place]) 12/17/2015   asymptomatic, <1%, determined on regular pacemaker interrogation  . Pneumonia   . PONV (postoperative nausea and vomiting)   . S/P radiation therapy 04/24/15 completed   T-11  . Wears glasses     Patient Active Problem List   Diagnosis Date Noted  . COPD with acute exacerbation ([place]) 03/29/2018  . Allergic cough 03/22/2018  . Stage IV breast cancer in female [facility]) 02/08/2018  . Goals of care, counseling/discussion 12/10/2017  . Decreased thyroid stimulating hormone (TSH) level 08/26/2017  . Hyperglycemia 02/15/2017  . Degenerative tear of left medial meniscus 02/04/2017  . Cancer of left lung ([place]) 10/16/2016  . Malignant neoplasm of overlapping sites of right breast in female, estrogen receptor positive ([place]) 04/28/2016  . Breast cancer metastasized to bone ([place]) 03/20/2015  . Osteoporosis 02/27/2015  . Malignant neoplasm of upper-outer quadrant of left breast in female, estrogen receptor positive ([place]) 12/12/2014  . Hepatic steatosis 12/12/2014  . Giant cell arteritis ([place]) 06/02/2013  . Dehydration with hyponatremia 02/28/2013  . Transaminitis 02/28/2013  .  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 SIRS (systemic inflammatory response syndrome) ([place]) 02/27/2013  . Temporal arteritis ([place]) 02/27/2013  . Blurred vision, bilateral 12/13/2012  . Chronic fatigue and malaise 12/13/2012  . Bronchiectasis without acute exacerbation ([place]) 01/01/2012  . Memory loss 08/07/2010  . SVT/ PSVT/ PAT 09/20/2009  . COPD with chronic bronchitis ([place]) 08/21/2009  . PLANTAR FASCIITIS, BILATERAL 12/07/2008  . AV BLOCK, COMPLETE 09/07/2008  . PACEMAKER, PERMANENT  09/07/2008  . RHINITIS, ALLERGIC NEC 05/13/2007  . Goiter 05/12/2007  . IBS 05/12/2007  . DEGENERATIVE JOINT DISEASE 05/12/2007    Past Surgical History:  Procedure Laterality Date  . ANAL FISSURE REPAIR    . ARTERY BIOPSY Left 01/03/2013   Procedure: BIOPSY LEFT TEMPORAL ARTERY;  Surgeon: Ascencion Dike, MD;  Location: [facility];  Service: ENT;  Laterality: Left;  . BREAST BIOPSY Right   . CHOLECYSTECTOMY    . COLONOSCOPY    . FRACTURE SURGERY     fx lt wrist  . HEMORRHOID SURGERY    . INSERT / REPLACE / [place]   Gen change ([facility]) by Dr Olevia Perches 2009  . IR FLUORO GUIDE PORT INSERTION RIGHT  12/22/2017  . IR US GUIDE VASC ACCESS RIGHT  12/22/2017  . LUNG REMOVAL, PARTIAL  1950   left lower lobectomy for brochiectasis   . MASTECTOMY Left 02/08/2018  . MOUTH SURGERY     patient unaware  . RECTAL POLYPECTOMY    . RENAL CYST EXCISION    . TONSILLECTOMY    . TOTAL MASTECTOMY Left 02/08/2018  . TOTAL MASTECTOMY Left 02/08/2018   Procedure: TOTAL MASTECTOMY LEFT;  Surgeon: Fanny Skates, MD;  Location: [facility];  Service: General;  Laterality: Left;     OB History   None      Home Medications    Prior to Admission medications   Medication Sig Start Date End Date Taking? Authorizing Provider  acetaminophen (TYLENOL) 650 MG CR tablet Take 650 mg by mouth every 4 (four) hours as needed for pain.   Yes [provider]  alum & mag hydroxide-simeth (MAALOX/MYLANTA) 200-200-20 MG/5ML suspension Take 30 mLs by mouth every 4 (four) hours as needed for indigestion or heartburn. 03/29/18 03/31/18 Yes [provider]  Calcium Carb-Cholecalciferol (CALTRATE 600+D3 PO) Take 1 tablet by mouth daily.   Yes [provider]  everolimus (AFINITOR) 2.5 MG tablet Take 1 tablet (2.5 mg total) by mouth daily. Caution:chemotherapy. 12/10/17  Yes [name], [name], MD  exemestane (AROMASIN) 25 MG tablet Take 1 tablet (25 mg total) by mouth daily after  breakfast. 12/10/17  Yes [name], [name], MD  guaiFENesin (MUCINEX) 600 MG 12 hr tablet Take 600 mg by mouth 2 (two) times daily.   Yes [provider]  ipratropium-albuterol (DUONEB) 0.5-2.5 (3) MG/3ML SOLN Take 3 mLs by nebulization every 8 (eight) hours.   Yes [provider]  metoprolol succinate (TOPROL-XL) 50 MG 24 hr tablet Take 50 mg by mouth daily. Take with or immediately following a meal.   Yes [provider]  predniSONE (DELTASONE) 5 MG tablet Take 5 mg by mouth daily with breakfast. Reported on 11/19/2015   Yes [provider]  traMADol (ULTRAM) 50 MG tablet Take 50 mg by mouth every 6 (six) hours as needed (FOR PAIN).    Yes [provider]  Turmeric 500 MG TABS Take 500 mg by mouth daily.   Yes [provider]  diclofenac sodium (  VOLTAREN) 1 % GEL Apply 2 g topically 3 (three) times daily as needed (APPLY TO SHOULDER AND LEFT KNEE  FOR PAIN).     [provider]    Family History Family History  Problem Relation Age of Onset  . Heart disease Mother   . Breast cancer Mother   . Stomach cancer Father   . Alcohol abuse Father   . Seizures Son   . Thyroid disease Sister     Social History Social History   Tobacco Use  . Smoking status: Never Smoker  . Smokeless tobacco: Never Used  Substance Use Topics  . Alcohol use: Never    Alcohol/week: 0.0 oz    Frequency: Never    Comment: Occasional   . Drug use: No     Allergies   Amoxicillin-pot clavulanate and Morphine and related   Review of Systems Review of Systems 10 Systems reviewed and are negative for acute change except as noted in the HPI.  Physical Exam Updated Vital Signs BP (!) 155/101   Pulse 95   Temp 98.3 F (36.8 C) (Oral)   Resp (!) 25   Wt 56.2 kg (124 lb)   SpO2 94%   BMI 24.22 kg/m   Physical Exam  Constitutional: She is oriented to person, place, and time.  Patient is well-nourished and well-developed for age.  General  appearance is good.  She is alert and appropriate.  He does have harsh paroxysms of cough but does not have significant respiratory distress at rest.  HENT:  Head: Normocephalic and atraumatic.  Mouth/Throat: Oropharynx is clear and moist.  Eyes: EOM are normal.  Cardiovascular:  It is tachycardic.  Monitor shows periods of rhythm in the 80s to 90s with occasional increased rate to 120.  Appears consistent with variable atrial fib, possibly flutter.  Pulmonary/Chest:  Patient has intermittent harsh cough paroxysmal.  Diminished breath sounds on the left.  No gross rhonchi.  Rest sounds diffusely soft.  Patient has a surgical mastectomy site on the left chest wall that is healing well.  No erythema or drainage.  Abdominal: Soft. She exhibits no distension. There is no tenderness. There is no guarding.  Musculoskeletal: Normal range of motion. She exhibits no edema or tenderness.  No calf tenderness.  Neurological: She is alert and oriented to person, place, and time. She exhibits normal muscle tone. Coordination normal.  Skin: Skin is warm and dry.  Psychiatric: She has a normal mood and affect.     ED Treatments / Results  Labs (all labs ordered are listed, but only abnormal results are displayed) Labs Reviewed  URINALYSIS, ROUTINE W REFLEX MICROSCOPIC - Abnormal; Notable for the following components:      Result Value   Color, Urine STRAW (*)    Specific Gravity, Urine 1.002 (*)    Ketones, ur 5 (*)    All other components within normal limits  COMPREHENSIVE METABOLIC PANEL - Abnormal; Notable for the following components:   Sodium 128 (*)    Potassium 3.0 (*)    Chloride 93 (*)    Glucose, Bld 107 (*)    BUN 5 (*)    Calcium 8.4 (*)    Total Protein 6.1 (*)    Albumin 3.0 (*)    AST 46 (*)    All other components within normal limits  CBC WITH DIFFERENTIAL/PLATELET - Abnormal; Notable for the following components:   Monocytes Absolute 1.2 (*)    All other components within  normal limits  BRAIN NATRIURETIC PEPTIDE - Abnormal; Notable for the following components:   B Natriuretic Peptide 136.5 (*)    All other components within normal limits  CULTURE, BLOOD (ROUTINE X 2)  CULTURE, BLOOD (ROUTINE X 2)  PROTIME-INR  I-STAT CG4 LACTIC ACID, ED  I-STAT TROPONIN, ED    EKG EKG Interpretation  Date/Time:  Wednesday March 30 2018 11:33:42 EDT Ventricular Rate:  104 PR Interval:    QRS Duration: 148 QT Interval:  381 QTC Calculation: 502 R Axis:   -66 Text Interpretation:  Sinus tachycardia Left bundle branch block agree. old LBBB Confirmed by Charlesetta Shanks [phone]) on 03/30/2018 3:25:44 PM   Radiology Dg Chest 2 View  Result Date: 03/30/2018 CLINICAL DATA:  Chest tightness with cough. EXAM: CHEST - 2 VIEW COMPARISON:  PET-CT 10/15/2017.  Chest x-ray 02/27/2013 FINDINGS: 1151 hours. Lungs are hyperexpanded. The lungs are clear without focal pneumonia, edema, pneumothorax or pleural effusion. Interstitial markings are diffusely coarsened with chronic features. Left suprahilar lesion compatible with the abnormality seen on previous PET-CT. Calcified granuloma noted in the medial parahilar right lung. The cardiopericardial silhouette is within normal limits for size. Left permanent pacemaker again noted. Telemetry leads overlie the chest. IMPRESSION: 1. Hyperexpansion without acute cardiopulmonary findings. 2. Left suprahilar opacity compatible with left upper lobe lesion seen on previous PET-CT. Electronically Signed   By: Misty Stanley M.D.   On: 03/30/2018 12:41    Procedures Procedures (including critical care time) CRITICAL CARE Performed by: Charlesetta Shanks   Total critical care time: 30 minutes  Critical care time was exclusive of separately billable procedures and treating other patients.  Critical care was necessary to treat or prevent imminent or life-threatening deterioration.  Critical care was time spent personally by me on the following  activities: development of treatment plan with patient and/or surrogate as well as nursing, discussions with consultants, evaluation of patient's response to treatment, examination of patient, obtaining history from patient or surrogate, ordering and performing treatments and interventions, ordering and review of laboratory studies, ordering and review of radiographic studies, pulse oximetry and re-evaluation of patient's condition. Medications Ordered in ED Medications  sodium chloride 0.9 % bolus 500 mL (500 mLs Intravenous New Bag/Given 03/30/18 1519)  potassium chloride 10 mEq in 100 mL IVPB (has no administration in time range)  potassium chloride SA (K-DUR,KLOR-CON) CR tablet 40 mEq (has no administration in time range)  famotidine (PEPCID) IVPB 20 mg premix (has no administration in time range)  0.9 %  sodium chloride infusion ( Intravenous New Bag/Given 03/30/18 1519)  metoprolol tartrate (LOPRESSOR) injection 2.5 mg (2.5 mg Intravenous Given 03/30/18 1517)     Initial Impression / Assessment and Plan / ED Course  I have reviewed the triage vital signs and the nursing notes.  Pertinent labs & imaging results that were available during my care of the patient were reviewed by me and considered in my medical decision making (see chart for details).     Consult: Dr. Zigmund Daniel tried hospitalist for admission Final Clinical Impressions(s) / ED Diagnoses   Final diagnoses:  Atrial fibrillation, new onset ([place])  Hyponatremia  Malignant neoplasm of female breast, unspecified estrogen receptor status, unspecified laterality, unspecified site of breast [facility])  Patient has multiple complex medical problems.  She has had increased deterioration over the past week with a lot of coughing and increasing general weakness.  There are several potential etiologies.  Patient has not had a confirmed pneumonia to date.  We will proceed with CT of the  chest to evaluate for possible occult findings not  visualized on plain films.  She has known cancer and also suspected pneumonitis from everolimus.  Dr. Jana Hakim has called to make recommendations for decadron 10 mg IV. monitor indicates variable rate of paroxysmal atrial fibrillation.  It appears this is new for the patient.  This again may be chemotherapy related.  She is also hyponatremic and mildly hypokalemic.  Will plan for hydration and replacement with hospital admission.  ED Discharge Orders    None       Charlesetta Shanks, MD 03/30/18 [phone]

## 2018-03-30 NOTE — ED Notes (Signed)
Patient transported to X-ray 

## 2018-03-30 NOTE — Progress Notes (Signed)
Patient arrived on unit from ED. Family at bedside.  Telemetry placed and CMT notified.

## 2018-03-30 NOTE — ED Notes (Signed)
Bed: AQ77 Expected date:  Expected time:  Means of arrival:  Comments: EMS-[facility] PT.

## 2018-03-30 NOTE — ED Notes (Signed)
Pt is having intermittent episodes of tachycardia.  Strips printed out and given to EDP.  Pt denies any distress with this.  During these brief episodes pt HA increases to a max of 164

## 2018-03-30 NOTE — ED Triage Notes (Signed)
Generalized illness with some wheezing, some URI with coughing and pain with deep inspiration.  No fever.  Pt feels generalized weakness.  Pt is alert and oriented.  [facility] Pt comes with paperwork including out of facility DNR.

## 2018-03-30 NOTE — H&P (Signed)
History and Physical    Lauren Buchanan IPJ:[id number] DOB: [birth year] DOA: 03/30/2018  PCP: Blanchie Serve, MD Patient [facility] from: [facility]  Chief Complaint: Cough shortness of breath and pleuritic chest pain  HPI: Lauren Buchanan is a 84 y.o. female with medical history significant of bilateral breast malignancy status post left mastectomy a month ago, currently on treatment with everolimus last 5 weeks admitted with complaints of productive cough with tan-colored phlegm.  She has not had any fever or chills, she does have pleuritic chest pain with coughing.,  And complains of shortness of breath.  She was treated as an outpatient for bronchitis without any improvement.  Patient has complaints of severe heartburn and difficulty swallowing.  When I asked her is a difficult to swallow solids or liquids she is was not sure which one.  However she reports she has very decreased appetite and does not want to eat anything.  Denies any abdominal pain diarrhea or constipation.  Denies urinary complaints.    ED Course: Vital signs were 155 /101 pulse is 95 respiration 25 pulse ox 94% on room air.  CT of the chest showed bilateral small pleural effusion, left upper lobe groundglass opacity unchanged from previous CT scan.  X-ray showed hyperexpansion without acute cardiopulmonary findings, left suprahilar opacity compatible with left upper lobe lesion seen on previous PET/CT.  Sodium was 128 which is down from 138 a month ago, potassium 3.0, BUN 5, creatinine 0.64 Albumin 3.0 AST 46 BNP 136 troponin 0 0.03 lactic acid level 1.07 hemoglobin 12.3 hematocrit 36.4 WBC count 8.8 platelet count 188.  She received potassium 50 mEq, normal saline, metoprolol, and famotidine in the ER.  Review of Systems: Significant for dysphagia and heartburn and decreased appetite Ambulatory Status:  Past Medical History:  Diagnosis Date  . Asthma    "as a child"  . Breast cancer ([place]) 12/08/2014   Bilateral  .  Cancer of central portion of female breast ([place]) 12/04/2014  . Cancer of central portion of female breast ([place]) 12/04/2014  . Complete heart block ([place]) 1999   s/p PPM by Dr Olevia Perches, most recent generator change 2009  . Complication of anesthesia   . COPD (chronic obstructive pulmonary disease) ([place])   . Dementia   . Diverticulosis   . DJD (degenerative joint disease)    RA  . Esophageal stricture   . Family history of adverse reaction to anesthesia    Daughter N/V  . GERD (gastroesophageal reflux disease)   . Giant cell arteritis ([place])   . Goiter   . Hemorrhoids   . Hiatal hernia   . IBS (irritable bowel syndrome)   . Paroxysmal atrial fibrillation ([place]) 12/17/2015   asymptomatic, <1%, determined on regular pacemaker interrogation  . Pneumonia   . PONV (postoperative nausea and vomiting)   . S/P radiation therapy 04/24/15 completed   T-11  . Wears glasses     Past Surgical History:  Procedure Laterality Date  . ANAL FISSURE REPAIR    . ARTERY BIOPSY Left 01/03/2013   Procedure: BIOPSY LEFT TEMPORAL ARTERY;  Surgeon: Ascencion Dike, MD;  Location: [place];  Service: ENT;  Laterality: Left;  . BREAST BIOPSY Right   . CHOLECYSTECTOMY    . COLONOSCOPY    . FRACTURE SURGERY     fx lt wrist  . HEMORRHOID SURGERY    . INSERT / REPLACE / [place]   Gen change (SJM) by Dr Olevia Perches 2009  .  IR FLUORO GUIDE PORT INSERTION RIGHT  12/22/2017  . IR US GUIDE VASC ACCESS RIGHT  12/22/2017  . LUNG REMOVAL, PARTIAL  1950   left lower lobectomy for brochiectasis   . MASTECTOMY Left 02/08/2018  . MOUTH SURGERY     patient unaware  . RECTAL POLYPECTOMY    . RENAL CYST EXCISION    . TONSILLECTOMY    . TOTAL MASTECTOMY Left 02/08/2018  . TOTAL MASTECTOMY Left 02/08/2018   Procedure: TOTAL MASTECTOMY LEFT;  Surgeon: Fanny Skates, MD;  Location: [place];  Service: General;  Laterality: Left;    Social History   Socioeconomic History  . Marital status: Married     Spouse name: Not on file  . Number of children: 4  . Years of education: Not on file  . Highest education level: Not on file  Occupational History  . Occupation: Retired  Scientific laboratory technician  . Financial resource strain: Not on file  . Food insecurity:    Worry: Not on file    Inability: Not on file  . Transportation needs:    Medical: Not on file    Non-medical: Not on file  Tobacco Use  . Smoking status: Never Smoker  . Smokeless tobacco: Never Used  Substance and Sexual Activity  . Alcohol use: Never    Alcohol/week: 0.0 oz    Frequency: Never    Comment: Occasional   . Drug use: No  . Sexual activity: Not Currently  Lifestyle  . Physical activity:    Days per week: Not on file    Minutes per session: Not on file  . Stress: Not on file  Relationships  . Social connections:    Talks on phone: Not on file    Gets together: Not on file    Attends religious service: Not on file    Active member of club or organization: Not on file    Attends meetings of clubs or organizations: Not on file    Relationship status: Not on file  . Intimate partner violence:    Fear of current or ex partner: Not on file    Emotionally abused: Not on file    Physically abused: Not on file    Forced sexual activity: Not on file  Other Topics Concern  . Not on file  Social History Narrative   Social History     Marital status: Widowed          Spouse name:                        Years of education:                 Number of children: 4           Occupational History   Occupation: Copywriter, advertising                Retired : Yes                                      Social History Main Topics     Smoking status: Never Smoker  Smokeless tobacco: Never Used                         Alcohol use:         0.0 oz/week        Comment:      Drug use: No               Sexual activity:        Other Topics             Concern     None on file      Social History Narrative     Do you drink/eat things with caffeine?    Yes     Marital status: Widowed.  What year were you married? 1948     Do you have a living will? Yes     Do you have DNR form? Yes     Do you have signed POA/HPOA forms? Yes             Allergies  Allergen Reactions  . Amoxicillin-Pot Clavulanate Other (See Comments)    Upset stomach Has patient had a PCN reaction causing immediate rash, facial/tongue/throat swelling, SOB or lightheadedness with hypotension: No Has patient had a PCN reaction causing severe rash involving mucus membranes or skin necrosis: No Has patient had a PCN reaction that required hospitalization: No Has patient had a PCN reaction occurring within the last 10 years: No If all of the above answers are "NO", then may proceed with Cephalosporin use.   Marland Kitchen Morphine And Related Nausea Only    Family History  Problem Relation Age of Onset  . Heart disease Mother   . Breast cancer Mother   . Stomach cancer Father   . Alcohol abuse Father   . Seizures Son   . Thyroid disease Sister       Prior to Admission medications   Medication Sig Start Date End Date Taking? Authorizing Provider  acetaminophen (TYLENOL) 650 MG CR tablet Take 650 mg by mouth every 4 (four) hours as needed for pain.   Yes [provider]  alum & mag hydroxide-simeth (MAALOX/MYLANTA) 200-200-20 MG/5ML suspension Take 30 mLs by mouth every 4 (four) hours as needed for indigestion or heartburn. 03/29/18 03/31/18 Yes [provider]  Calcium Carb-Cholecalciferol (CALTRATE 600+D3 PO) Take 1 tablet by mouth daily.   Yes [provider]  everolimus (AFINITOR) 2.5 MG tablet Take 1 tablet (2.5 mg total) by mouth daily. Caution:chemotherapy. 12/10/17  Yes [name], [name], MD  exemestane (AROMASIN) 25 MG tablet Take 1 tablet (25 mg total) by mouth daily after breakfast. 12/10/17  Yes [name], [name], MD  guaiFENesin  (MUCINEX) 600 MG 12 hr tablet Take 600 mg by mouth 2 (two) times daily.   Yes [provider]  ipratropium-albuterol (DUONEB) 0.5-2.5 (3) MG/3ML SOLN Take 3 mLs by nebulization every 8 (eight) hours.   Yes [provider]  metoprolol succinate (TOPROL-XL) 50 MG 24 hr tablet Take 50 mg by mouth daily. Take with or immediately following a meal.   Yes [provider]  predniSONE (DELTASONE) 5 MG tablet Take 5 mg by mouth daily with breakfast. Reported on 11/19/2015   Yes [provider]  traMADol (ULTRAM) 50 MG tablet Take 50 mg by mouth every 6 (six) hours as needed (FOR PAIN).    Yes [provider]  Turmeric 500 MG TABS Take 500 mg by mouth daily.   Yes [provider]  diclofenac sodium (VOLTAREN) 1 % GEL Apply 2 g topically 3 (three) times daily as needed (APPLY TO SHOULDER AND LEFT KNEE  FOR PAIN).     [provider]    Physical Exam: Vitals:   03/30/18 1336 03/30/18 1400 03/30/18 1442 03/30/18 1521  BP: (!) 173/80 (!) 159/78  (!) 155/101  Pulse: (!) 103 (!) 109  95  Resp: 18 20  (!) 25  Temp:   98.3 F (36.8 C)   TempSrc:   Oral   SpO2: 97% 98%  94%  Weight:         General:  Appears calm and comfortable Eyes:  PERRL, EOMI, normal lids, iris ENT: grossly normal hearing, lips & tongue, mmm Neck:  no LAD, masses or thyromegaly Cardiovascular: RRR, no m/r/g. No LE edema.  Respiratory:  CTA bilaterally, no w/r/r. Normal respiratory effort. Abdomen:  soft, ntnd, NABS Skin: Incision site on the left chest clean dry and dry intact no evidence of drainage Musculoskeletal:  grossly normal tone BUE/BLE, good ROM, no bony abnormality Psychiatric: grossly normal mood and affect, speech fluent and appropriate, AOx3 Neurologic:  CN 2-12 grossly intact, moves all extremities in coordinated fashion, sensation intact  Labs on Admission: I have personally reviewed following labs and imaging studies  CBC: Recent Labs  Lab  03/30/18 1327  WBC 8.8  NEUTROABS 6.1  HGB 12.3  HCT 36.4  MCV 81.8  PLT 782   Basic Metabolic Panel: Recent Labs  Lab 03/30/18 1327  NA 128*  K 3.0*  CL 93*  CO2 26  GLUCOSE 107*  BUN 5*  CREATININE 0.64  CALCIUM 8.4*   GFR: Estimated Creatinine Clearance: 36.7 mL/min (by C-G formula based on SCr of 0.64 mg/dL). Liver Function Tests: Recent Labs  Lab 03/30/18 1327  AST 46*  ALT 34  ALKPHOS 69  BILITOT 0.7  PROT 6.1*  ALBUMIN 3.0*   No results for input(s): LIPASE, AMYLASE in the last 168 hours. No results for input(s): AMMONIA in the last 168 hours. Coagulation Profile: Recent Labs  Lab 03/30/18 1327  INR 0.98   Cardiac Enzymes: No results for input(s): CKTOTAL, CKMB, CKMBINDEX, TROPONINI in the last 168 hours. BNP (last 3 results) No results for input(s): PROBNP in the last 8760 hours. HbA1C: No results for input(s): HGBA1C in the last 72 hours. CBG: No results for input(s): GLUCAP in the last 168 hours. Lipid Profile: No results for input(s): CHOL, HDL, LDLCALC, TRIG, CHOLHDL, LDLDIRECT in the last 72 hours. Thyroid Function Tests: No results for input(s): TSH, T4TOTAL, FREET4, T3FREE, THYROIDAB in the last 72 hours. Anemia Panel: No results for input(s): VITAMINB12, FOLATE, FERRITIN, TIBC, IRON, RETICCTPCT in the last 72 hours. Urine analysis:    Component Value Date/Time   COLORURINE STRAW (A) 03/30/2018 1246   APPEARANCEUR CLEAR 03/30/2018 1246   LABSPEC 1.002 (L) 03/30/2018 1246   PHURINE 7.0 03/30/2018 1246   GLUCOSEU NEGATIVE 03/30/2018 1246   HGBUR NEGATIVE 03/30/2018 1246   HGBUR trace-intact 01/17/2010 0932   BILIRUBINUR NEGATIVE 03/30/2018 1246   BILIRUBINUR n 09/01/2011 1157   KETONESUR 5 (A) 03/30/2018 1246   PROTEINUR NEGATIVE 03/30/2018 1246   UROBILINOGEN 0.2 09/27/2014 1800   NITRITE NEGATIVE 03/30/2018 1246   LEUKOCYTESUR NEGATIVE 03/30/2018 1246    Creatinine Clearance: Estimated Creatinine Clearance: 36.7 mL/min (by  C-G formula based on SCr of 0.64 mg/dL).  Sepsis Labs: @LABRCNTIP (procalcitonin:4,lacticidven:4) )No results found for this or any previous visit (from the past 240 hour(s)).  Radiological Exams on Admission: Dg Chest 2 View  Result Date: 03/30/2018 CLINICAL DATA:  Chest tightness with cough. EXAM: CHEST - 2 VIEW COMPARISON:  PET-CT 10/15/2017.  Chest x-ray 02/27/2013 FINDINGS: 1151 hours. Lungs are hyperexpanded. The lungs are clear without focal pneumonia, edema, pneumothorax or pleural effusion. Interstitial markings are diffusely coarsened with chronic features. Left suprahilar lesion compatible with the abnormality seen on previous PET-CT. Calcified granuloma noted in the medial parahilar right lung. The cardiopericardial silhouette is within normal limits for size. Left permanent pacemaker again noted. Telemetry leads overlie the chest. IMPRESSION: 1. Hyperexpansion without acute cardiopulmonary findings. 2. Left suprahilar opacity compatible with left upper lobe lesion seen on previous PET-CT. Electronically Signed   By: Misty Stanley M.D.   On: 03/30/2018 12:41   Ct Angio Chest Pe W/cm &/or Wo Cm  Result Date: 03/30/2018 CLINICAL DATA:  Upper respiratory infection.  Coughing.  Wheezing. EXAM: CT ANGIOGRAPHY CHEST WITH CONTRAST TECHNIQUE: Multidetector CT imaging of the chest was performed using the standard protocol during bolus administration of intravenous contrast. Multiplanar CT image reconstructions and MIPs were obtained to evaluate the vascular anatomy. CONTRAST:  137mL ISOVUE-370 IOPAMIDOL (ISOVUE-370) INJECTION 76% COMPARISON:  PET-CT 10/15/2017 FINDINGS: Cardiovascular: LEFT-sided pacemaker. No significant vascular findings. Normal heart size. No pericardial effusion. Port in the anterior chest wall with tip in distal SVC. Mediastinum/Nodes: No axillary or supraclavicular adenopathy. Nodular enlargement of the LEFT lobe of the thyroid gland nodules measure 2 cm. These nodules were not  hypermetabolic comparison PET-CT scan. No mediastinal hilar adenopathy.  Small hiatal hernia. Lungs/Pleura: Small bilateral pleural effusions. Calcified scar in the LEFT lower lobe following LEFT lower lobe lobectomy. Calcified granuloma in the RIGHT upper lobe. No suspicious pulmonary nodules. A ground-glass opacity in the superior segment of the posterior LEFT upper lobe measures 3.8 by 2.2 cm unchanged (image 41/10. This mixed density nodule was mildly hypermetabolic on comparison PET-CT scan. Upper Abdomen: Limited view of the liver, kidneys, pancreas are unremarkable. Normal adrenal glands. Musculoskeletal: Stable sclerotic lesion in the T9 vertebral body. Seroma in the LEFT breast at mastectomy site. Review of the MIP images confirms the above findings. IMPRESSION: 1. Elongated sub solid lesion in the LEFT upper lobe unchanged from comparison PET and CT scan. 2. Bilateral small effusions. 3. Small hiatal hernia. Electronically Signed   By: Suzy Bouchard M.D.   On: 03/30/2018 16:16    EKG: Independently reviewed.  Assessment/Plan Active Problems:   * No active hospital problems. *   1] bilateral breast cancer left mastectomy to a month ago on everolimus for the last 5 weeks-patient presented with symptoms of cough shortness of breath and pleuritic chest pain no fever.  CT of the chest shows very small bilateral pleural effusion no acute changes noted.  ED physician spoke with Dr. Jana Hakim and discussed to give her Decadron 10 mg IV and prednisone.  However patient is very reluctant to swallowing anything and is having severe heartburn and dysphagia.  I will keep her on Decadron for tonight and reassess tomorrow.  She has no evidence of pneumonia I am not going to start her on antibiotics at this time.  2] paroxysmal atrial fibrillation which is apparently new this patient when I was in the room she appeared to be in normal sinus rhythm.  I will check a TSH, echocardiogram.  Her first troponin was  negative.  I will not do any further troponin since I do not think she is having an active MI.  I will  hold off on anticoagulation at this time with her age and fragility and complex medical problems.  Reassess tomorrow once oncologist sees the patient.  3] hypokalemia secondary to decreased p.o. intake replete and recheck.  4] hyponatremia probably multifactorial decreased p.o. intake and possible pneumonitis follow-up levels tomorrow.  Avoid hypotonic fluids.  5] GERD/dysphagia continue Pepcid.  Consult speech therapy    DVT prophylaxis: Lovenox  code Status: Full code Family Communication: Patient's daughter was in the room while I was talking to the patient Disposition Plan: TBD Consults called: ED physician spoke with oncologist Dr. Jana Hakim Admission status: Inpatient  Georgette Shell MD [facility]  If 7PM-7AM, please contact night-coverage [email or web address] Password [facility]  03/30/2018, 5:12 PM

## 2018-03-31 ENCOUNTER — Encounter: Payer: Self-pay | Admitting: Nurse Practitioner

## 2018-03-31 ENCOUNTER — Telehealth: Payer: Self-pay | Admitting: Oncology

## 2018-03-31 DIAGNOSIS — I10 Essential (primary) hypertension: Secondary | ICD-10-CM

## 2018-03-31 DIAGNOSIS — J189 Pneumonia, unspecified organism: Secondary | ICD-10-CM

## 2018-03-31 DIAGNOSIS — E876 Hypokalemia: Secondary | ICD-10-CM

## 2018-03-31 DIAGNOSIS — G9341 Metabolic encephalopathy: Secondary | ICD-10-CM | POA: Diagnosis present

## 2018-03-31 DIAGNOSIS — C7951 Secondary malignant neoplasm of bone: Secondary | ICD-10-CM

## 2018-03-31 DIAGNOSIS — E871 Hypo-osmolality and hyponatremia: Secondary | ICD-10-CM

## 2018-03-31 DIAGNOSIS — C50919 Malignant neoplasm of unspecified site of unspecified female breast: Secondary | ICD-10-CM

## 2018-03-31 LAB — BASIC METABOLIC PANEL
Anion gap: 12 (ref 5–15)
BUN: 6 mg/dL (ref 6–20)
CO2: 20 mmol/L — ABNORMAL LOW (ref 22–32)
CREATININE: 0.76 mg/dL (ref 0.44–1.00)
Calcium: 8.5 mg/dL — ABNORMAL LOW (ref 8.9–10.3)
Chloride: 102 mmol/L (ref 101–111)
GFR calc non Af Amer: >60 mL/min (ref >60)
Glucose, Bld: 153 mg/dL — ABNORMAL HIGH (ref 65–99)
POTASSIUM: 4.9 mmol/L (ref 3.5–5.1)
SODIUM: 134 mmol/L — AB (ref 135–145)

## 2018-03-31 LAB — CBC
HCT: 37.2 % (ref 36.0–46.0)
Hemoglobin: 12.5 g/dL (ref 12.0–15.0)
MCH: 27.4 pg (ref 26.0–34.0)
MCHC: 33.6 g/dL (ref 30.0–36.0)
MCV: 81.6 fL (ref 78.0–100.0)
Platelets: 247 10*3/uL (ref 150–400)
RBC: 4.56 MIL/uL (ref 3.87–5.11)
RDW: 14.6 % (ref 11.5–15.5)
WBC: 8.2 10*3/uL (ref 4.0–10.5)

## 2018-03-31 LAB — TSH: TSH: 0.719 u[IU]/mL (ref 0.350–4.500)

## 2018-03-31 LAB — MAGNESIUM: MAGNESIUM: 1.8 mg/dL (ref 1.7–2.4)

## 2018-03-31 MED ORDER — HALOPERIDOL LACTATE 5 MG/ML IJ SOLN
2.0000 mg | INTRAMUSCULAR | Status: DC | PRN
  Administered 2018-03-31: 2 mg via INTRAVENOUS
  Filled 2018-03-31: qty 1

## 2018-03-31 NOTE — Progress Notes (Signed)
Pt need constant reminding about where she is and how we are not going to the dinning room for lunch. Have reminded pt our food service will be brought to her room several times. Pt is pleasantly confused and needs assistance frequently.

## 2018-03-31 NOTE — Evaluation (Signed)
Clinical/Bedside Swallow Evaluation Patient Details  Name: Lauren Buchanan MRN: [id number] Date of Birth: 20-Aug-1925  Today's Date: 03/31/2018 Time: SLP Start Time (ACUTE ONLY): 80 SLP Stop Time (ACUTE ONLY): 1110 SLP Time Calculation (min) (ACUTE ONLY): 40 min  Past Medical History:  Past Medical History:  Diagnosis Date  . Asthma    "as a child"  . Breast cancer ([place]) 12/08/2014   Bilateral  . Cancer of central portion of female breast ([place]) 12/04/2014  . Cancer of central portion of female breast ([place]) 12/04/2014  . Complete heart block ([place]) 1999   s/p PPM by Dr Olevia Perches, most recent generator change 2009  . Complication of anesthesia   . COPD (chronic obstructive pulmonary disease) ([place])   . Dementia   . Diverticulosis   . DJD (degenerative joint disease)    RA  . Esophageal stricture   . Family history of adverse reaction to anesthesia    Daughter N/V  . GERD (gastroesophageal reflux disease)   . Giant cell arteritis ([place])   . Goiter   . Hemorrhoids   . Hiatal hernia   . IBS (irritable bowel syndrome)   . Paroxysmal atrial fibrillation ([place]) 12/17/2015   asymptomatic, <1%, determined on regular pacemaker interrogation  . Pneumonia   . PONV (postoperative nausea and vomiting)   . S/P radiation therapy 04/24/15 completed   T-11  . Wears glasses    Past Surgical History:  Past Surgical History:  Procedure Laterality Date  . ANAL FISSURE REPAIR    . ARTERY BIOPSY Left 01/03/2013   Procedure: BIOPSY LEFT TEMPORAL ARTERY;  Surgeon: Ascencion Dike, MD;  Location: [place];  Service: ENT;  Laterality: Left;  . BREAST BIOPSY Right   . CHOLECYSTECTOMY    . COLONOSCOPY    . FRACTURE SURGERY     fx lt wrist  . HEMORRHOID SURGERY    . INSERT / REPLACE / [place]   Gen change (SJM) by Dr Olevia Perches 2009  . IR FLUORO GUIDE PORT INSERTION RIGHT  12/22/2017  . IR US GUIDE VASC ACCESS RIGHT  12/22/2017  . LUNG REMOVAL, PARTIAL  1950   left lower lobectomy  for brochiectasis   . MASTECTOMY Left 02/08/2018  . MOUTH SURGERY     patient unaware  . RECTAL POLYPECTOMY    . RENAL CYST EXCISION    . TONSILLECTOMY    . TOTAL MASTECTOMY Left 02/08/2018  . TOTAL MASTECTOMY Left 02/08/2018   Procedure: TOTAL MASTECTOMY LEFT;  Surgeon: Fanny Skates, MD;  Location: [place];  Service: General;  Laterality: Left;   HPI:  84 yo with h/o breast cancer with radiation 04/24/15, recent masectomies one month ago, and undergoing tx with chemotherapy.  Pt admitted with possible pna, decreased po intake, cough/congestion, dysphagia.  She also has h/o goiter, giant cell arteritis, IBS, HH, dementia.   Swallow eval ordered.  Pt is a full code and is resident of [name].  Per MD note, pt without evidence of pna -    Assessment / Plan / Recommendation Clinical Impression  Suspect pt may be demonstrating indications consistent with esophagitis due to her chemotherapy.  Her cognitive deficit impairs her ability to provide premorbid details. However she demonstrates signs of odynophagia and points to lower esophagus.  Intermittent cough noted with intake - sitter reports random coughing.  Skilled intervention included determining items pt may tolerate better and compensations.  SLP will follow up to assure tolerance.  Left information for pt's  daughter regarding compensations for possible esophagitis.  Hopeful for improvement.  SLP Visit Diagnosis: Dysphagia, unspecified (R13.10)    Aspiration Risk  Mild aspiration risk    Diet Recommendation Dysphagia 3 (Mech soft);Thin liquid(room temp items, avoid spicy/acidic items)   Liquid Administration via: Cup;Straw Medication Administration: Whole meds with puree Supervision: Patient able to self feed Compensations: Slow rate;Small sips/bites Postural Changes: Seated upright at 90 degrees;Remain upright for at least 30 minutes after po intake    Other  Recommendations Oral Care Recommendations: Oral care BID Other  Recommendations: Have oral suction available   Follow up Recommendations        Frequency and Duration min 1 x/week  1 week       Prognosis        Swallow Study   General Date of Onset: 03/31/18 HPI: 84 yo with h/o breast cancer with radiation 04/24/15, recent masectomies one month ago, and undergoing tx with chemotherapy.  Pt admitted with possible pna, decreased po intake, cough/congestion, dysphagia.  She also has h/o goiter, giant cell arteritis, IBS, HH, dementia.   Swallow eval ordered.  Pt is a full code and is resident of [name].  Per MD note, pt without evidence of pna -  Type of Study: Bedside Swallow Evaluation Diet Prior to this Study: Dysphagia 3 (soft);Thin liquids Temperature Spikes Noted: No Respiratory Status: Room air History of Recent Intubation: No Behavior/Cognition: Alert;Cooperative;Pleasant mood Oral Cavity Assessment: Within Functional Limits Oral Care Completed by SLP: No Oral Cavity - Dentition: Adequate natural dentition Vision: Functional for self-feeding Self-Feeding Abilities: Able to feed self Patient Positioning: Upright in bed Baseline Vocal Quality: Normal Volitional Cough: Strong Volitional Swallow: Able to elicit    Oral/Motor/Sensory Function Overall Oral Motor/Sensory Function: Within functional limits   Ice Chips Ice chips: Not tested   Thin Liquid Thin Liquid: Impaired Presentation: Cup;Straw;Self Fed Pharyngeal  Phase Impairments: Cough - Delayed    Nectar Thick Nectar Thick Liquid: Within functional limits   Honey Thick Honey Thick Liquid: Not tested   Puree Puree: Impaired Presentation: Self Fed;Spoon Other Comments: pain sensation in distal esophagus reported by pt, slp warmed up applesauce with much improved tolerance   Solid   GO   Solid: Within functional limits Presentation: Self Fredirick Lathe 03/31/2018,11:23 AM   Luanna Salk, [facility] SLP [phone]

## 2018-03-31 NOTE — Clinical Social Work Note (Signed)
Clinical Social Work Assessment  Patient Details  Name: Lauren Buchanan MRN: [id number] Date of Birth: 1925/10/06  Date of referral:  03/31/18               Reason for consult:  Facility Placement                Permission sought to share information with:  Case Manager, Customer service manager, Family Supports Permission granted to share information::  No(Patient not oriented)  Name::     Biomedical engineer::  [facility]  Relationship::  Daughter  Contact Information:     Housing/Transportation Living arrangements for the past 2 months:  [place] of Information:  Adult Children Patient Interpreter Needed:  None Criminal Activity/Legal Involvement Pertinent to Current Situation/Hospitalization:  No - Comment as needed Significant Relationships:  Adult Children, Warehouse manager Lives with:  Facility Resident Do you feel safe going back to the place where you live?  Yes Need for family participation in patient care:  Yes (Comment)  Care giving concerns:  No care giving concerns at the time of assessment.    Social Worker assessment / plan:  LCSW  Following for facility placement.   Patient from [facility].   Patient for SOB and pleuritic chest pain.   Patient is pleasantly confused. LCSW spoke with daughter [name], by phone.   Daughter states that patient is a resident at [facility]. Patient has been in ALF since march of 2018. Prior to ALF she was an independent resident for 2 years. [name], states that patient is independent in ADLs and ambulates independently.   Patient went to SNF in April.   PLAN: return to ALF, pending PT/OT rec.    Employment status:  Retired Forensic scientist:  Medicare PT Recommendations:  Not assessed at this time Information / Referral to community resources:     Patient/Family's Response to care:  Daughter is thankful for [facility] coordination with dc planning.   Patient/Family's Understanding  of and Emotional Response to Diagnosis, Current Treatment, and Prognosis:  [name] is proactive in patient's care. [name] reports that patient was independent until her cancer diagnosis. [name] is agreeable to return to ALF, unless PT recommends otherwise.   Emotional Assessment Appearance:  Appears stated age Attitude/Demeanor/Rapport:    Affect (typically observed):  (Pleasantly confused) Orientation:  Oriented to Self Alcohol / Substance use:  Not Applicable Psych involvement (Current and /or in the community):  No (Comment)  Discharge Needs  Concerns to be addressed:    Readmission within the last 30 days:  No Current discharge risk:  None Barriers to Discharge:  Continued Medical Work up   [employer], LCSW 03/31/2018, 3:08 PM

## 2018-03-31 NOTE — Telephone Encounter (Signed)
Scheduled appt per 6/12 sch msg - spoke w/ pts daughter re appts. She couldn't do 6/19, so I scheduled her on 6/20. Also cancelled chemo this Friday due to pt being in the hospital - sending message to [facility] re the cancellation.

## 2018-03-31 NOTE — Progress Notes (Signed)
Notified by central telemetry that patient had a lead off.  Pt has a pacemaker, with paced ventricular rhythm.  Tele suspended for now. Will address when patient awakens.

## 2018-03-31 NOTE — Progress Notes (Addendum)
Progress Note    Lauren Buchanan  QGB:[id number] DOB: 1925-03-21  DOA: 03/30/2018 PCP: Blanchie Serve, MD    Brief Narrative:   Chief complaint: F/U SOB  Medical records reviewed and are as summarized below:  Lauren Buchanan is an 84 y.o. female with a PMH of bilateral breast malignancy status post left mastectomy approximately 1 month ago, currently on active treatment with everolimus, who was admitted 03/30/2018 for evaluation of shortness of breath associated with cough and pleuritic chest pain.  In the ED showed a stable CT with small bilateral pleural effusions, left upper lobe groundglass opacity unchanged from prior CT scan.  Sodium was 128, potassium 3.0, BUN 5, creatinine 0.64.  There was no elevation of troponin or lactic acid.  Assessment/Plan:   Principal Problem:   Pneumonitis CT personally reviewed. Findings consistent with pneumonitis. Patient's care was discussed with Dr. Jana Hakim who recommended treatment with steroids.  There was no evidence of pneumonia so antibiotics were not felt to be necessary and no evidence of PE.  Active Problems:   Acute metabolic encephalopathy Persistent despite correction of hyponatremia.  Possibly being exacerbated by steroids.  Will need to touch base with her family regarding her usual baseline level of function.  PT/OT evaluations. Daughter reports that she has underlying Alzheimer's disease and often gets confused when she is in a new environment.    Hyponatremia Likely secondary to dehydration.  Sodium has improved with hydration.    Breast cancer metastasized to bone [facility]) Dr. Jana Hakim aware of hospital admission.    ? Atrial fibrillation, paroxysmal ([place]) New onset.  TSH WNL.  Troponin not elevated.  2D echocardiogram ordered.  EKG personally reviewed and is V-paced. Will need to weigh the risks/benefits of anticoagulation while on active treatment for cancer. CHADs2vasc sore is 4 corresponding to a 4% risk of stroke per  year, but I don't see any evidence of afib. Will d/c tele since she is pulling off leads.     Hypokalemia Resolved with supplementation.    Essential hypertension Continue metoprolol.   Family Communication/Anticipated D/C date and plan/Code Status   DVT prophylaxis: Lovenox ordered. Code Status: Full Code.  Family Communication: No family at the bedside. Daughter updated by telephone. Disposition Plan: Pending PT/OT/CM recommendations. Likely d/c 04/01/18/   Medical Consultants:    Oncology   Anti-Infectives:    None  Subjective:   RN reports confusion, restlessness, pulling off telemetry leads. Patient currently sitting up and calm. Denies SOB. Reported 2 episodes of diarrhea. Appetite fair.  Objective:    Vitals:   03/30/18 1521 03/30/18 1742 03/30/18 2022 03/31/18 0325  BP: (!) 155/101 (!) 180/88 (!) 161/74 (!) 156/74  Pulse: 95 91 90 89  Resp: (!) 25 19  17   Temp:   98.5 F (36.9 C) 98.1 F (36.7 C)  TempSrc:   Oral   SpO2: 94% 97% 97% 95%  Weight:   56.2 kg (123 lb 14.4 oz)   Height:   5' (1.524 m)     Intake/Output Summary (Last 24 hours) at 03/31/2018 0806 Last data filed at 03/31/2018 0200 Gross per 24 hour  Intake 2031.26 ml  Output -  Net 2031.26 ml   Filed Weights   03/30/18 1119 03/30/18 2022  Weight: 56.2 kg (124 lb) 56.2 kg (123 lb 14.4 oz)    Exam: General: No acute distress. Cardiovascular: Heart sounds are regular. No gallops or rubs. No murmurs. No JVD. Lungs: Clear to auscultation bilaterally with good air  movement. No rales, rhonchi or wheezes. Abdomen: Soft, nontender, nondistended with normal active bowel sounds. No masses. No hepatosplenomegaly. Neurological: Alert but disoriented to place/date. Moves all extremities 4 with equal strength. Cranial nerves II through XII grossly intact. Skin: Warm and dry. No rashes or lesions. Extremities: No clubbing or cyanosis. No edema. Pedal pulses 2+. Psychiatric: Mood and affect are  normal. Insight and judgment are impaired.   Data Reviewed:   I have personally reviewed following labs and imaging studies:  Labs: Labs show the following:   Basic Metabolic Panel: Recent Labs  Lab 03/30/18 1327 03/31/18 0406  NA 128* 134*  K 3.0* 4.9  CL 93* 102  CO2 26 20*  GLUCOSE 107* 153*  BUN 5* 6  CREATININE 0.64 0.76  CALCIUM 8.4* 8.5*  MG  --  1.8   GFR Estimated Creatinine Clearance: 36.7 mL/min (by C-G formula based on SCr of 0.76 mg/dL). Liver Function Tests: Recent Labs  Lab 03/30/18 1327  AST 46*  ALT 34  ALKPHOS 69  BILITOT 0.7  PROT 6.1*  ALBUMIN 3.0*   Coagulation profile Recent Labs  Lab 03/30/18 1327  INR 0.98    CBC: Recent Labs  Lab 03/30/18 1327 03/31/18 0406  WBC 8.8 8.2  NEUTROABS 6.1  --   HGB 12.3 12.5  HCT 36.4 37.2  MCV 81.8 81.6  PLT 188 247   Thyroid function studies: Recent Labs    03/31/18 0406  TSH 0.719   Sepsis Labs: Recent Labs  Lab 03/30/18 1327 03/30/18 1334 03/31/18 0406  WBC 8.8  --  8.2  LATICACIDVEN  --  1.07  --     Microbiology No results found for this or any previous visit (from the past 240 hour(s)).  Procedures and diagnostic studies:  Dg Chest 2 View  Result Date: 03/30/2018 CLINICAL DATA:  Chest tightness with cough. EXAM: CHEST - 2 VIEW COMPARISON:  PET-CT 10/15/2017.  Chest x-ray 02/27/2013 FINDINGS: 1151 hours. Lungs are hyperexpanded. The lungs are clear without focal pneumonia, edema, pneumothorax or pleural effusion. Interstitial markings are diffusely coarsened with chronic features. Left suprahilar lesion compatible with the abnormality seen on previous PET-CT. Calcified granuloma noted in the medial parahilar right lung. The cardiopericardial silhouette is within normal limits for size. Left permanent pacemaker again noted. Telemetry leads overlie the chest. IMPRESSION: 1. Hyperexpansion without acute cardiopulmonary findings. 2. Left suprahilar opacity compatible with left  upper lobe lesion seen on previous PET-CT. Electronically Signed   By: Misty Stanley M.D.   On: 03/30/2018 12:41   Ct Angio Chest Pe W/cm &/or Wo Cm  Result Date: 03/30/2018 CLINICAL DATA:  Upper respiratory infection.  Coughing.  Wheezing. EXAM: CT ANGIOGRAPHY CHEST WITH CONTRAST TECHNIQUE: Multidetector CT imaging of the chest was performed using the standard protocol during bolus administration of intravenous contrast. Multiplanar CT image reconstructions and MIPs were obtained to evaluate the vascular anatomy. CONTRAST:  146mL ISOVUE-370 IOPAMIDOL (ISOVUE-370) INJECTION 76% COMPARISON:  PET-CT 10/15/2017 FINDINGS: Cardiovascular: LEFT-sided pacemaker. No significant vascular findings. Normal heart size. No pericardial effusion. Port in the anterior chest wall with tip in distal SVC. Mediastinum/Nodes: No axillary or supraclavicular adenopathy. Nodular enlargement of the LEFT lobe of the thyroid gland nodules measure 2 cm. These nodules were not hypermetabolic comparison PET-CT scan. No mediastinal hilar adenopathy.  Small hiatal hernia. Lungs/Pleura: Small bilateral pleural effusions. Calcified scar in the LEFT lower lobe following LEFT lower lobe lobectomy. Calcified granuloma in the RIGHT upper lobe. No suspicious pulmonary nodules. A ground-glass opacity  in the superior segment of the posterior LEFT upper lobe measures 3.8 by 2.2 cm unchanged (image 41/10. This mixed density nodule was mildly hypermetabolic on comparison PET-CT scan. Upper Abdomen: Limited view of the liver, kidneys, pancreas are unremarkable. Normal adrenal glands. Musculoskeletal: Stable sclerotic lesion in the T9 vertebral body. Seroma in the LEFT breast at mastectomy site. Review of the MIP images confirms the above findings. IMPRESSION: 1. Elongated sub solid lesion in the LEFT upper lobe unchanged from comparison PET and CT scan. 2. Bilateral small effusions. 3. Small hiatal hernia. Electronically Signed   By: Suzy Bouchard M.D.    On: 03/30/2018 16:16    Medications:   . enoxaparin (LOVENOX) injection  40 mg Subcutaneous Q24H  . exemestane  25 mg Oral QPC breakfast  . metoprolol succinate  50 mg Oral Daily  . predniSONE  10 mg Oral Q breakfast   Continuous Infusions: . 0.9 % NaCl with KCl 20 mEq / L 75 mL/hr at 03/30/18 2344  . famotidine (PEPCID) IV Stopped (03/30/18 2344)     LOS: 1 day   Jacquelynn Cree  [facility] Pager [phone]. If unable to reach me by pager, please call my cell phone at [phone].  *Please refer to [email or web address], password [email or web address] to get updated schedule on who will round on this patient, as hospitalists switch teams weekly. If 7PM-7AM, please contact night-coverage at [email or web address], password [email or web address] for any overnight needs.  03/31/2018, 8:06 AM

## 2018-04-01 ENCOUNTER — Other Ambulatory Visit: Payer: Self-pay | Admitting: *Deleted

## 2018-04-01 ENCOUNTER — Inpatient Hospital Stay: Payer: Medicare Other

## 2018-04-01 LAB — CBC
HEMATOCRIT: 32.6 % — AB (ref 36.0–46.0)
Hemoglobin: 10.8 g/dL — ABNORMAL LOW (ref 12.0–15.0)
MCH: 27.5 pg (ref 26.0–34.0)
MCHC: 33.1 g/dL (ref 30.0–36.0)
MCV: 83 fL (ref 78.0–100.0)
PLATELETS: 223 10*3/uL (ref 150–400)
RBC: 3.93 MIL/uL (ref 3.87–5.11)
RDW: 14.7 % (ref 11.5–15.5)
WBC: 9.5 10*3/uL (ref 4.0–10.5)

## 2018-04-01 LAB — BASIC METABOLIC PANEL
Anion gap: 5 (ref 5–15)
BUN: 9 mg/dL (ref 6–20)
CO2: 23 mmol/L (ref 22–32)
Calcium: 7.7 mg/dL — ABNORMAL LOW (ref 8.9–10.3)
Chloride: 107 mmol/L (ref 101–111)
Creatinine, Ser: 0.64 mg/dL (ref 0.44–1.00)
GFR calc Af Amer: >60 mL/min (ref >60)
GLUCOSE: 102 mg/dL — AB (ref 65–99)
POTASSIUM: 3.8 mmol/L (ref 3.5–5.1)
Sodium: 135 mmol/L (ref 135–145)

## 2018-04-01 LAB — COMPREHENSIVE METABOLIC PANEL
ALBUMIN: 3.2
CHLORIDE: 92
CO2: 30
Calcium: 8.6
Globulin: 2.1
Protein: 5.3

## 2018-04-01 MED ORDER — ALUM & MAG HYDROXIDE-SIMETH 200-200-20 MG/5ML PO SUSP: 30.0000 mL | ORAL | 0 refills | Status: AC | PRN

## 2018-04-01 MED ORDER — FAMOTIDINE 20 MG PO TABS: 20.0000 mg | ORAL_TABLET | Freq: Two times a day (BID) | ORAL | Status: DC

## 2018-04-01 MED ORDER — HEPARIN SOD (PORK) LOCK FLUSH 100 UNIT/ML IV SOLN
500.0000 [IU] | INTRAVENOUS | Status: AC | PRN
  Administered 2018-04-01: 500 [IU]

## 2018-04-01 NOTE — Evaluation (Signed)
Physical Therapy Evaluation Patient Details Name: Lauren Buchanan MRN: [id number] DOB: 08-24-1925 Today's Date: 04/01/2018   History of Present Illness  Lauren Buchanan is a 84 y.o. female with medical history significant of bilateral breast malignancy status post left mastectomy a month ago, currently on treatment with everolimus last 5 weeks admitted with complaints of productive cough with tan-colored phlegm, adm iwth pneumonitis  Clinical Impression  Patient evaluated by Physical Therapy with no further acute PT needs identified. All education has been completed and the patient has no further questions. eval limited d/t pt rapid fatigue and pt c/o not feeling well; pt with mild instability during transfer to chair, per NT/sitter pt amb to bathroom grossly with min/guard for safety and balance, pt is independent at her baseline; recommend HHPT at ALF to maximize safety and independence with functional mobility as well as to decr risk of falls;See below for any follow-up Physical Therapy or equipment needs. PT is signing off. Thank you for this referral.     Follow Up Recommendations Home health PT;Supervision for mobility/OOB(at ALF)    Equipment Recommendations  None recommended by PT    Recommendations for Other Services       Precautions / Restrictions Precautions Precautions: Fall      Mobility  Bed Mobility Overal bed mobility: Needs Assistance Bed Mobility: Supine to Sit     Supine to sit: Supervision     General bed mobility comments: incr time, supervision for safety  Transfers Overall transfer level: Needs assistance Equipment used: None;Rolling walker (2 wheeled) Transfers: Sit to/from [employer] Transfers Sit to Stand: Supervision;Min guard Stand pivot transfers: Min guard       General transfer comment: fatigued after sitting EOB, required short rest prior to transfer, min/gaurd to transfer to chair, mild instability--pt declined to amb any  furhter than pivotal steps d/t fatigue  Ambulation/Gait                Stairs            Wheelchair Mobility    Modified Rankin (Stroke Patients Only)       Balance Overall balance assessment: Needs assistance           Standing balance-Leahy Scale: Fair Standing balance comment: close supervision for safe standing                              Pertinent Vitals/Pain Pain Assessment: No/denies pain    Home Living Family/patient expects to be discharged to:: Assisted living               Home Equipment: Kasandra Knudsen - single point      Prior Function Level of Independence: Independent         Comments: assistance with medicine management     Hand Dominance        Extremity/Trunk Assessment   Upper Extremity Assessment Upper Extremity Assessment: Overall WFL for tasks assessed    Lower Extremity Assessment Lower Extremity Assessment: Overall WFL for tasks assessed       Communication   Communication: No difficulties  Cognition Arousal/Alertness: Awake/alert Behavior During Therapy: WFL for tasks assessed/performed Overall Cognitive Status: Within Functional Limits for tasks assessed                                        General Comments  Exercises     Assessment/Plan    PT Assessment All further PT needs can be met in the next venue of care  PT Problem List         PT Treatment Interventions      PT Goals (Current goals can be found in the Care Plan section)  Acute Rehab PT Goals Patient Stated Goal: home if possible, today PT Goal Formulation: All assessment and education complete, DC therapy(pt d/c'd at time of eval)    Frequency     Barriers to discharge        Co-evaluation               AM-PAC PT "6 Clicks" Daily Activity  Outcome Measure Difficulty turning over in bed (including adjusting bedclothes, sheets and blankets)?: A Little Difficulty moving from lying on back to  sitting on the side of the bed? : A Little Difficulty sitting down on and standing up from a chair with arms (e.g., wheelchair, bedside commode, etc,.)?: Unable Help needed moving to and from a bed to chair (including a wheelchair)?: A Little Help needed walking in hospital room?: A Lot Help needed climbing 3-5 steps with a railing? : A Lot 6 Click Score: 14    End of Session Equipment Utilized During Treatment: Gait belt Activity Tolerance: Patient limited by fatigue Patient left: in chair;with call bell/phone within reach;with chair alarm set;with nursing/sitter in room   PT Visit Diagnosis: Difficulty in walking, not elsewhere classified (R26.2)    Time: 3606-7703 PT Time Calculation (min) (ACUTE ONLY): 8 min   Charges:   PT Evaluation $PT Eval Low Complexity: 1 Low     PT G CodesKenyon Ana, PT Pager: [phone] 04/01/2018   [facility] 04/01/2018, 1:12 PM

## 2018-04-01 NOTE — Discharge Summary (Addendum)
Physician Discharge Summary  Lauren Buchanan CWC:[id number] DOB: Jul 03, 1925 DOA: 03/30/2018  PCP: Blanchie Serve, MD  Admit date: 03/30/2018 Discharge date: 04/01/2018  Admitted From: ALF Discharge disposition: ALF   Recommendations for Outpatient Follow-Up:   1. Please repeat EKG in office in 1 week to ensure there is no evidence of atrial fibrillation. 2. F/U with Dr. Jana Hakim in 1-2 weeks to discuss further treatment options.   Discharge Diagnosis:   Principal Problem:   Suspected pneumonitis from chemotherapy Active Problems:   Hyponatremia   Breast cancer metastasized to bone ([place])   Stage IV breast cancer in female [facility])   ? Atrial fibrillation, new onset ([place]) - EKG showed paced rhythm, likely artifact   Hypokalemia   Essential hypertension   Acute metabolic encephalopathy  Discharge Condition: Improved.  Diet recommendation: Low sodium, heart healthy. Dysphagia III.  Code status: Full.   History of Present Illness:   Lauren Buchanan is an 84 y.o. female with a PMH of bilateral breast malignancy status post left mastectomy approximately 1 month ago, currently on active treatment with everolimus, who was admitted 03/30/2018 for evaluation of shortness of breath associated with cough and pleuritic chest pain.  In the ED showed a stable CT with small bilateral pleural effusions, left upper lobe groundglass opacity unchanged from prior CT scan.  Sodium was 128, potassium 3.0, BUN 5, creatinine 0.64.  There was no elevation of troponin or lactic acid.  Hospital Course by Problem:   Principal Problem:   Pneumonitis CT personally reviewed. Unchanged ground glass opacities---? pneumonitis. Patient's care was discussed with Dr. Jana Hakim by the admitting MD, and he recommended treatment with steroids.  There was no evidence of pneumonia so antibiotics were not felt to be necessary and no evidence of PE.  Steroids subsequently discontinued 03/31/2018 due to extreme  restlessness/agitation and resolution of shortness of breath. Recommend close F/U with Dr. Jana Hakim to discuss further treatment options.  Active Problems:   Acute metabolic encephalopathy Persistent despite correction of hyponatremia.  Possibly was exacerbated by steroids, which have now been discontinued.  Required Haldol for agitation 03/31/18. Ambulatory in room with nursing staff.  Daughter reports that she has underlying Alzheimer's disease and often gets confused when she is in a new environment.    Hyponatremia Likely secondary to dehydration.  Sodium has normalized with hydration.    Breast cancer metastasized to bone [facility]) Dr. Jana Hakim aware of hospital admission. Care discussed with him prior to discharge.  Continue Aromatase but continue to hold Afinitor.    ? Atrial fibrillation, paroxysmal (HCC) TSH WNL.  Troponin not elevated.  EKG personally reviewed and is V-paced. CHADs2vasc sore is 4 corresponding to a 4% risk of stroke per year, but I don't see any evidence of afib. Recommend repeat EKG in 1 week at PCP's office.    Hypokalemia Resolved with supplementation.    Essential hypertension Continue metoprolol.  Medical Consultants:    Telephone consult with Dr. Jana Hakim   Discharge Exam:   Vitals:   03/31/18 2022 04/01/18 0425  BP: 137/76 (!) 142/61  Pulse: (!) 108 93  Resp: 20 14  Temp: 98 F (36.7 C) 98.4 F (36.9 C)  SpO2: 100% 98%   Vitals:   03/31/18 0325 03/31/18 1337 03/31/18 2022 04/01/18 0425  BP: (!) 156/74 (!) 152/72 137/76 (!) 142/61  Pulse: 89 81 (!) 108 93  Resp: 17 20 20 14   Temp: 98.1 F (36.7 C) 98.2 F (36.8 C) 98 F (36.7 C) 98.4 F (  36.9 C)  TempSrc:  Oral Oral Oral  SpO2: 95% 97% 100% 98%  Weight:      Height:        General exam: Appears calm and comfortable. Sitting up in chair. Respiratory system: Clear to auscultation. Respiratory effort normal. Cardiovascular system: S1 & S2 heard, RRR. No JVD,  rubs, gallops or  clicks. No murmurs. Gastrointestinal system: Abdomen is nondistended, soft and nontender. No organomegaly or masses felt. Normal bowel sounds heard. Central nervous system: Alert to self. Pleasantly confused with ST memory impairment. No focal neurological deficits. Extremities: No clubbing, or cyanosis. No edema. Skin: No rashes, lesions or ulcers. Psychiatry: Judgement and insight appear impaired. Mood & affect irritable.    The results of significant diagnostics from this hospitalization (including imaging, microbiology, ancillary and laboratory) are listed below for reference.     Procedures and Diagnostic Studies:   Dg Chest 2 View  Result Date: 03/30/2018 CLINICAL DATA:  Chest tightness with cough. EXAM: CHEST - 2 VIEW COMPARISON:  PET-CT 10/15/2017.  Chest x-ray 02/27/2013 FINDINGS: 1151 hours. Lungs are hyperexpanded. The lungs are clear without focal pneumonia, edema, pneumothorax or pleural effusion. Interstitial markings are diffusely coarsened with chronic features. Left suprahilar lesion compatible with the abnormality seen on previous PET-CT. Calcified granuloma noted in the medial parahilar right lung. The cardiopericardial silhouette is within normal limits for size. Left permanent pacemaker again noted. Telemetry leads overlie the chest. IMPRESSION: 1. Hyperexpansion without acute cardiopulmonary findings. 2. Left suprahilar opacity compatible with left upper lobe lesion seen on previous PET-CT. Electronically Signed   By: Misty Stanley M.D.   On: 03/30/2018 12:41   Ct Angio Chest Pe W/cm &/or Wo Cm  Result Date: 03/30/2018 CLINICAL DATA:  Upper respiratory infection.  Coughing.  Wheezing. EXAM: CT ANGIOGRAPHY CHEST WITH CONTRAST TECHNIQUE: Multidetector CT imaging of the chest was performed using the standard protocol during bolus administration of intravenous contrast. Multiplanar CT image reconstructions and MIPs were obtained to evaluate the vascular anatomy. CONTRAST:  168mL  ISOVUE-370 IOPAMIDOL (ISOVUE-370) INJECTION 76% COMPARISON:  PET-CT 10/15/2017 FINDINGS: Cardiovascular: LEFT-sided pacemaker. No significant vascular findings. Normal heart size. No pericardial effusion. Port in the anterior chest wall with tip in distal SVC. Mediastinum/Nodes: No axillary or supraclavicular adenopathy. Nodular enlargement of the LEFT lobe of the thyroid gland nodules measure 2 cm. These nodules were not hypermetabolic comparison PET-CT scan. No mediastinal hilar adenopathy.  Small hiatal hernia. Lungs/Pleura: Small bilateral pleural effusions. Calcified scar in the LEFT lower lobe following LEFT lower lobe lobectomy. Calcified granuloma in the RIGHT upper lobe. No suspicious pulmonary nodules. A ground-glass opacity in the superior segment of the posterior LEFT upper lobe measures 3.8 by 2.2 cm unchanged (image 41/10. This mixed density nodule was mildly hypermetabolic on comparison PET-CT scan. Upper Abdomen: Limited view of the liver, kidneys, pancreas are unremarkable. Normal adrenal glands. Musculoskeletal: Stable sclerotic lesion in the T9 vertebral body. Seroma in the LEFT breast at mastectomy site. Review of the MIP images confirms the above findings. IMPRESSION: 1. Elongated sub solid lesion in the LEFT upper lobe unchanged from comparison PET and CT scan. 2. Bilateral small effusions. 3. Small hiatal hernia. Electronically Signed   By: Suzy Bouchard M.D.   On: 03/30/2018 16:16     Labs:   Basic Metabolic Panel: Recent Labs  Lab 03/30/18 1327 03/31/18 0406 04/01/18 0349  NA 128* 134* 135  K 3.0* 4.9 3.8  CL 93* 102 107  CO2 26 20* 23  GLUCOSE 107* 153*  102*  BUN 5* 6 9  CREATININE 0.64 0.76 0.64  CALCIUM 8.4* 8.5* 7.7*  MG  --  1.8  --    GFR Estimated Creatinine Clearance: 36.7 mL/min (by C-G formula based on SCr of 0.64 mg/dL). Liver Function Tests: Recent Labs  Lab 03/30/18 1327  AST 46*  ALT 34  ALKPHOS 69  BILITOT 0.7  PROT 6.1*  ALBUMIN 3.0*    Coagulation profile Recent Labs  Lab 03/30/18 1327  INR 0.98    CBC: Recent Labs  Lab 03/30/18 1327 03/31/18 0406 04/01/18 0349  WBC 8.8 8.2 9.5  NEUTROABS 6.1  --   --   HGB 12.3 12.5 10.8*  HCT 36.4 37.2 32.6*  MCV 81.8 81.6 83.0  PLT 188 247 223   Thyroid function studies Recent Labs    03/31/18 0406  TSH 0.719   Microbiology Recent Results (from the past 240 hour(s))  Culture, blood (routine x 2)     Status: None (Preliminary result)   Collection Time: 03/30/18 12:53 PM  Result Value Ref Range Status   Specimen Description   Final    BLOOD PORTA CATH Performed at [facility], [place] [address], [place], [place] [address]    Special Requests   Final    BOTTLES DRAWN AEROBIC AND ANAEROBIC Blood Culture adequate volume Performed at [place] [address] [place], [place] [address]    Culture   Final    NO GROWTH < 24 HOURS Performed at [place] [address], [place], [place] [address]    Report Status PENDING  Incomplete  Culture, blood (routine x 2)     Status: None (Preliminary result)   Collection Time: 03/30/18  1:41 PM  Result Value Ref Range Status   Specimen Description   Final    BLOOD LEFT ANTECUBITAL Performed at [place] [address], [place], [place] [address]    Special Requests   Final    BOTTLES DRAWN AEROBIC AND ANAEROBIC Blood Culture adequate volume Performed at [facility] [address], [place], [place] [address]    Culture   Final    NO GROWTH < 24 HOURS Performed at [facility] [address], [place],  [address]    Report Status PENDING  Incomplete     Discharge Instructions:   Discharge Instructions    Call MD for:  difficulty breathing, headache or visual disturbances   Complete by:  As directed    Diet - low sodium heart healthy   Complete by:  As directed    Discharge instructions   Complete by:  As  directed    Your chemotherapy has been stopped.  Be sure to follow up with Dr. Jana Hakim to discuss further treatment options.   Increase activity slowly   Complete by:  As directed      Allergies as of 04/01/2018      Reactions   Amoxicillin-pot Clavulanate Other (See Comments)   Upset stomach Has patient had a PCN reaction causing immediate rash, facial/tongue/throat swelling, SOB or lightheadedness with hypotension: No Has patient had a PCN reaction causing severe rash involving mucus membranes or skin necrosis: No Has patient had a PCN reaction that required hospitalization: No Has patient had a PCN reaction occurring within the last 10 years: No If all of the above answers are "NO", then may proceed with Cephalosporin use.   Morphine And Related Nausea Only  Medication List    STOP taking these medications   everolimus 2.5 MG tablet Commonly known as:  AFINITOR   predniSONE 5 MG tablet Commonly known as:  DELTASONE     TAKE these medications   acetaminophen 650 MG CR tablet Commonly known as:  TYLENOL Take 650 mg by mouth every 4 (four) hours as needed for pain.   alum & mag hydroxide-simeth 200-200-20 MG/5ML suspension Commonly known as:  MAALOX/MYLANTA Take 30 mLs by mouth every 4 (four) hours as needed for up to 2 days for indigestion or heartburn.   CALTRATE 600+D3 PO Take 1 tablet by mouth daily.   diclofenac sodium 1 % Gel Commonly known as:  VOLTAREN Apply 2 g topically 3 (three) times daily as needed (APPLY TO SHOULDER AND LEFT KNEE  FOR PAIN).   exemestane 25 MG tablet Commonly known as:  AROMASIN Take 1 tablet (25 mg total) by mouth daily after breakfast.   guaiFENesin 600 MG 12 hr tablet Commonly known as:  MUCINEX Take 600 mg by mouth 2 (two) times daily.   ipratropium-albuterol 0.5-2.5 (3) MG/3ML Soln Commonly known as:  DUONEB Take 3 mLs by nebulization every 8 (eight) hours.   metoprolol succinate 50 MG 24 hr tablet Commonly known as:   TOPROL-XL Take 50 mg by mouth daily. Take with or immediately following a meal.   traMADol 50 MG tablet Commonly known as:  ULTRAM Take 50 mg by mouth every 6 (six) hours as needed (FOR PAIN).   Turmeric 500 MG Tabs Take 500 mg by mouth daily.      Follow-up Information    [name], [name], MD Follow up in 1 week(s).   Specialty:  Oncology Why:  Hospital follow up.  Call him if you have further problems with your breathing. Contact information: [place] [address] [phone]        Blanchie Serve, MD. Schedule an appointment as soon as possible for a visit in 1 week(s).   Specialty:  Internal Medicine Why:  Repeat EKG in office. Contact information: [place] [address] [phone]            Time coordinating discharge: 35 minutes.  SignedMargreta Journey Kaydince Towles  Pager [phone] [facility] 04/01/2018, 10:08 AM

## 2018-04-01 NOTE — NC FL2 (Signed)
[place] LEVEL OF CARE SCREENING TOOL     IDENTIFICATION  Patient Name: Lauren Buchanan Birthdate: 04-07-1925 Sex: female Admission Date (Current Location): 03/30/2018  [facility] and [place] Number:  Herbalist and Address:  [facility],  [place] [address] [place]      Provider Number: [id number]  Attending Physician Name and Address:  Rama, Venetia Maxon, MD  Relative Name and Phone Number:       Current Level of Care: Hospital Recommended Level of Care: [place] Prior Approval Number:    Date Approved/Denied:   PASRR Number:    Discharge Plan: Other (Comment)(ALF)    Current Diagnoses: Patient Active Problem List   Diagnosis Date Noted  . Hypokalemia 03/31/2018  . Essential hypertension 03/31/2018  . Acute metabolic encephalopathy 38/75/6433  . Pneumonitis 03/30/2018  . Atrial fibrillation, new onset ([place])   . COPD with acute exacerbation ([place]) 03/29/2018  . Allergic cough 03/22/2018  . Stage IV breast cancer in female [facility]) 02/08/2018  . Goals of care, counseling/discussion 12/10/2017  . Decreased thyroid stimulating hormone (TSH) level 08/26/2017  . Hyperglycemia 02/15/2017  . Degenerative tear of left medial meniscus 02/04/2017  . Cancer of left lung ([place]) 10/16/2016  . Malignant neoplasm of overlapping sites of right breast in female, estrogen receptor positive ([place]) 04/28/2016  . Breast cancer metastasized to bone ([place]) 03/20/2015  . Osteoporosis 02/27/2015  . Malignant neoplasm of upper-outer quadrant of left breast in female, estrogen receptor positive ([place]) 12/12/2014  . Hepatic steatosis 12/12/2014  . Giant cell arteritis ([place]) 06/02/2013  . Hyponatremia 02/28/2013  . Transaminitis 02/28/2013  . 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 SIRS (systemic inflammatory response syndrome) ([place]) 02/27/2013  . Temporal arteritis ([place]) 02/27/2013  . Blurred vision,  bilateral 12/13/2012  . Chronic fatigue and malaise 12/13/2012  . Bronchiectasis without acute exacerbation ([place]) 01/01/2012  . Memory loss 08/07/2010  . SVT/ PSVT/ PAT 09/20/2009  . COPD with chronic bronchitis ([place]) 08/21/2009  . PLANTAR FASCIITIS, BILATERAL 12/07/2008  . AV BLOCK, COMPLETE 09/07/2008  . PACEMAKER, PERMANENT 09/07/2008  . RHINITIS, ALLERGIC NEC 05/13/2007  . Goiter 05/12/2007  . IBS 05/12/2007  . DEGENERATIVE JOINT DISEASE 05/12/2007    Orientation RESPIRATION BLADDER Height & Weight     Self  Normal Continent Weight: 123 lb 14.4 oz (56.2 kg) Height:  5' (152.4 cm)  BEHAVIORAL SYMPTOMS/MOOD NEUROLOGICAL BOWEL NUTRITION STATUS      Continent Diet  Dysphagia 3 (Mech soft);Thin liquid(room temp items, avoid spicy/acidic items)   Liquid Administration via: Cup;Straw Medication Administration: Whole meds with puree Supervision: Patient able to self feed Compensations: Slow rate;Small sips/bites Postural Changes: Seated upright at 90 degrees;Remain upright for at least 30 minutes after po intake    AMBULATORY STATUS COMMUNICATION OF NEEDS Skin   Limited Assist Verbally Normal                       Personal Care Assistance Level of Assistance  Bathing, Feeding, Dressing Bathing Assistance: Limited assistance Feeding assistance: Independent Dressing Assistance: Limited assistance     Functional Limitations Info  Sight, Hearing, Speech Sight Info: Impaired Hearing Info: Impaired Speech Info: Adequate    SPECIAL CARE FACTORS FREQUENCY                       Contractures Contractures Info: Not present    Additional Factors Info  Code Status, Allergies Code  Status Info: Full Allergies Info:  Amoxicillin-pot Clavulanate, Morphine And Related           Current Medications (04/01/2018):  This is the current hospital active medication list Current Facility-Administered Medications  Medication Dose Route Frequency Provider Last Rate Last  Dose  . 0.9 % NaCl with KCl 20 mEq/ L  infusion   Intravenous Continuous Rama, Venetia Maxon, MD 75 mL/hr at 04/01/18 0345    . alum & mag hydroxide-simeth (MAALOX/MYLANTA) 200-200-20 MG/5ML suspension 30 mL  30 mL Oral Q4H PRN Rama, Venetia Maxon, MD   30 mL at 04/01/18 1017  . enoxaparin (LOVENOX) injection 40 mg  40 mg Subcutaneous Q24H Rama, Venetia Maxon, MD   40 mg at 03/31/18 2126  . exemestane (AROMASIN) tablet 25 mg  25 mg Oral QPC breakfast Rama, Venetia Maxon, MD   25 mg at 04/01/18 0807  . famotidine (PEPCID) tablet 20 mg  20 mg Oral BID Rama, Venetia Maxon, MD      . haloperidol lactate (HALDOL) injection 2 mg  2 mg Intravenous Q4H PRN Rama, Venetia Maxon, MD   2 mg at 03/31/18 2118  . metoprolol succinate (TOPROL-XL) 24 hr tablet 50 mg  50 mg Oral Daily Rama, Venetia Maxon, MD   50 mg at 04/01/18 0807  . sodium chloride flush (NS) 0.9 % injection 10-40 mL  10-40 mL Intracatheter PRN Rama, Venetia Maxon, MD      . traMADol (ULTRAM) tablet 50 mg  50 mg Oral Q6H PRN Rama, Venetia Maxon, MD         Discharge Medications: Medication List    STOP taking these medications   everolimus 2.5 MG tablet Commonly known as:  AFINITOR   predniSONE 5 MG tablet Commonly known as:  DELTASONE     TAKE these medications   acetaminophen 650 MG CR tablet Commonly known as:  TYLENOL Take 650 mg by mouth every 4 (four) hours as needed for pain.   alum & mag hydroxide-simeth 200-200-20 MG/5ML suspension Commonly known as:  MAALOX/MYLANTA Take 30 mLs by mouth every 4 (four) hours as needed for up to 2 days for indigestion or heartburn.   CALTRATE 600+D3 PO Take 1 tablet by mouth daily.   diclofenac sodium 1 % Gel Commonly known as:  VOLTAREN Apply 2 g topically 3 (three) times daily as needed (APPLY TO SHOULDER AND LEFT KNEE  FOR PAIN).   exemestane 25 MG tablet Commonly known as:  AROMASIN Take 1 tablet (25 mg total) by mouth daily after breakfast.   guaiFENesin 600 MG 12 hr tablet Commonly  known as:  MUCINEX Take 600 mg by mouth 2 (two) times daily.   ipratropium-albuterol 0.5-2.5 (3) MG/3ML Soln Commonly known as:  DUONEB Take 3 mLs by nebulization every 8 (eight) hours.   metoprolol succinate 50 MG 24 hr tablet Commonly known as:  TOPROL-XL Take 50 mg by mouth daily. Take with or immediately following a meal.   traMADol 50 MG tablet Commonly known as:  ULTRAM Take 50 mg by mouth every 6 (six) hours as needed (FOR PAIN).   Turmeric 500 MG Tabs Take 500 mg by mouth daily.       Relevant Imaging Results:  Relevant Lab Results:   Additional Information SS# [id number], please sent up [facility] at Rosa Sanchez, LCSW

## 2018-04-01 NOTE — Care Management Important Message (Signed)
Important Message  Patient Details  Name: Lauren Buchanan MRN: [id number] Date of Birth: 1924/12/12   Medicare Important Message Given:  Yes    Kerin Salen 04/01/2018, 11:49 AMImportant Message  Patient Details  Name: Lauren Buchanan MRN: [id number] Date of Birth: [birth year]   Medicare Important Message Given:  Yes    Kerin Salen 04/01/2018, 11:49 AM

## 2018-04-01 NOTE — Progress Notes (Signed)
Report given to Helene Kelp, RN at [facility]. No questions or concerns at this time. Her daughter will be picking her up to take her to the facility-per social work.

## 2018-04-01 NOTE — Progress Notes (Signed)
LCSW following for facility placement.  Patient is from [facility].  LCSW confirmed return.   LCSW faxed dc docs.   LCSW notified family of dc.   Patient will transport by family.   RN report #: [phone]

## 2018-04-01 NOTE — Progress Notes (Signed)
OT Cancellation Note  Patient Details Name: Lauren Buchanan MRN: [id number] DOB: 1925-06-26   Cancelled Treatment:    Reason Eval/Treat Not Completed: Other (comment)  [facility]  Kari Baars, [place]  Payton Mccallum D 04/01/2018, 12:58 PM

## 2018-04-04 ENCOUNTER — Encounter: Payer: Self-pay | Admitting: Nurse Practitioner

## 2018-04-04 ENCOUNTER — Non-Acute Institutional Stay: Payer: Medicare Other | Admitting: Nurse Practitioner

## 2018-04-04 DIAGNOSIS — N183 Chronic kidney disease, stage 3 (moderate): Secondary | ICD-10-CM | POA: Diagnosis not present

## 2018-04-04 DIAGNOSIS — C50919 Malignant neoplasm of unspecified site of unspecified female breast: Secondary | ICD-10-CM

## 2018-04-04 DIAGNOSIS — J301 Allergic rhinitis due to pollen: Secondary | ICD-10-CM | POA: Diagnosis not present

## 2018-04-04 DIAGNOSIS — K589 Irritable bowel syndrome without diarrhea: Secondary | ICD-10-CM | POA: Diagnosis not present

## 2018-04-04 DIAGNOSIS — Z7189 Other specified counseling: Secondary | ICD-10-CM | POA: Diagnosis not present

## 2018-04-04 DIAGNOSIS — J449 Chronic obstructive pulmonary disease, unspecified: Secondary | ICD-10-CM | POA: Diagnosis not present

## 2018-04-04 DIAGNOSIS — Z7389 Other problems related to life management difficulty: Secondary | ICD-10-CM | POA: Diagnosis not present

## 2018-04-04 DIAGNOSIS — Z902 Acquired absence of lung [part of]: Secondary | ICD-10-CM | POA: Diagnosis not present

## 2018-04-04 DIAGNOSIS — R1312 Dysphagia, oropharyngeal phase: Secondary | ICD-10-CM | POA: Diagnosis not present

## 2018-04-04 DIAGNOSIS — R2681 Unsteadiness on feet: Secondary | ICD-10-CM | POA: Diagnosis not present

## 2018-04-04 DIAGNOSIS — R627 Adult failure to thrive: Secondary | ICD-10-CM | POA: Diagnosis not present

## 2018-04-04 DIAGNOSIS — C7951 Secondary malignant neoplasm of bone: Secondary | ICD-10-CM | POA: Diagnosis not present

## 2018-04-04 DIAGNOSIS — I4891 Unspecified atrial fibrillation: Secondary | ICD-10-CM

## 2018-04-04 DIAGNOSIS — D630 Anemia in neoplastic disease: Secondary | ICD-10-CM | POA: Diagnosis not present

## 2018-04-04 DIAGNOSIS — Z950 Presence of cardiac pacemaker: Secondary | ICD-10-CM | POA: Diagnosis not present

## 2018-04-04 DIAGNOSIS — Z4889 Encounter for other specified surgical aftercare: Secondary | ICD-10-CM | POA: Diagnosis not present

## 2018-04-04 DIAGNOSIS — R1310 Dysphagia, unspecified: Secondary | ICD-10-CM | POA: Diagnosis not present

## 2018-04-04 DIAGNOSIS — K219 Gastro-esophageal reflux disease without esophagitis: Secondary | ICD-10-CM | POA: Diagnosis not present

## 2018-04-04 DIAGNOSIS — G9340 Encephalopathy, unspecified: Secondary | ICD-10-CM | POA: Diagnosis not present

## 2018-04-04 LAB — CULTURE, BLOOD (ROUTINE X 2)
Culture: NO GROWTH
Culture: NO GROWTH
Special Requests: ADEQUATE
Special Requests: ADEQUATE

## 2018-04-04 NOTE — Assessment & Plan Note (Signed)
Advanced age, hemotherapy for breast cancer, Hx of lung cancer are contributory, Hospice referral, Oxycodone 5mg  q6h prn, Zofran 4mg  q6h prn, needs ACP, Dc Tumeric, Voltaren ge, Caltrate. Observe.

## 2018-04-04 NOTE — Progress Notes (Addendum)
Location:  [place] Room Number: 546 Place of Service:  ALF [phone]) Provider:  Lyllian Gause, ManXie  NP  Blanchie Serve, MD  Patient Care Team: Blanchie Serve, MD as PCP - General (Internal Medicine) Fanny Skates, MD as Consulting Physician (General Surgery) [name], [name], MD as Consulting Physician (Oncology) [name], MD as Consulting Physician (Radiation Oncology) Rockwell Germany, RN as Registered Nurse Mauro Kaufmann, RN as Registered Nurse Gentry Fitz, MD as Consulting Physician (Family Medicine) Britten Parady X, NP as Nurse Practitioner (Internal Medicine) Thompson Grayer, MD as Consulting Physician (Cardiology)  Extended Emergency Contact Information Primary Emergency Contact: Matthews,[name] Address: [address]          [place], [place] [address] [place] Mobile Phone: [phone] Relation: Daughter Secondary Emergency Contact: Clent Demark [place] Phone: [phone] Relation: Other  Code Status:  DNR Goals of care: Advanced Directive information Advanced Directives 03/30/2018  Does Patient Have a Medical Advance Directive? Yes  Type of Paramedic of [place];Out of facility DNR (pink MOST or yellow form)  Does patient want to make changes to medical advance directive? No - Patient declined  Copy of [place] in Chart? Yes  Would patient like information on creating a medical advance directive? -  Pre-existing out of facility DNR order (yellow form or pink MOST form) Yellow form placed in chart (order not valid for inpatient use)     Chief Complaint  Patient presents with  . Acute Visit    patient hasa not eaten of drank anything for 3 days    HPI:  Pt is a 84 y.o. female seen today for an acute visit for nausea, vomiting, abd pain, limited oral intake, poor appetite,  malaise for 2-3 days. She has some non productive cough, denied chest pain or  palpitation, denied dysuria, last BM was this morning. She was hospitalized 03/30/18 -04/01/18 suspected pneumonitis from chemotherapy(treated with steroids), hyponatremia, breast cancer metastasized to bone per discharge summary(Dr [name] continue Aromatase, hold Afinitor), hypokalemia, acute metabolic encephalopathy, dysphagia III.    Discussed goal of care with the patient and POA daughter: desires of Hospice referral, no further hospitalization or workups, comfort measures.    Past Medical History:  Diagnosis Date  . Asthma    "as a child"  . Breast cancer ([facility]) 12/08/2014   Bilateral  . Cancer of central portion of female breast ([facility]) 12/04/2014  . Cancer of central portion of female breast ([facility]) 12/04/2014  . Complete heart block ([facility]) 1999   s/p PPM by Dr Olevia Perches, most recent generator change 2009  . Complication of anesthesia   . COPD (chronic obstructive pulmonary disease) ([facility])   . Dementia   . Diverticulosis   . DJD (degenerative joint disease)    RA  . Esophageal stricture   . Family history of adverse reaction to anesthesia    Daughter N/V  . GERD (gastroesophageal reflux disease)   . Giant cell arteritis ([facility])   . Goiter   . Hemorrhoids   . Hiatal hernia   . IBS (irritable bowel syndrome)   . Paroxysmal atrial fibrillation ([facility]) 12/17/2015   asymptomatic, <1%, determined on regular pacemaker interrogation  . Pneumonia   . PONV (postoperative nausea and vomiting)   . S/P radiation therapy 04/24/15 completed   T-11  . Wears glasses    Past Surgical History:  Procedure Laterality Date  . ANAL FISSURE REPAIR    .  ARTERY BIOPSY Left 01/03/2013   Procedure: BIOPSY LEFT TEMPORAL ARTERY;  Surgeon: Ascencion Dike, MD;  Location: [facility];  Service: ENT;  Laterality: Left;  . BREAST BIOPSY Right   . CHOLECYSTECTOMY    . COLONOSCOPY    . FRACTURE SURGERY     fx lt wrist  . HEMORRHOID SURGERY    . INSERT / REPLACE / [facility]   Gen change ([facility])  by Dr Olevia Perches 2009  . IR FLUORO GUIDE PORT INSERTION RIGHT  12/22/2017  . IR US GUIDE VASC ACCESS RIGHT  12/22/2017  . LUNG REMOVAL, PARTIAL  1950   left lower lobectomy for brochiectasis   . MASTECTOMY Left 02/08/2018  . MOUTH SURGERY     patient unaware  . RECTAL POLYPECTOMY    . RENAL CYST EXCISION    . TONSILLECTOMY    . TOTAL MASTECTOMY Left 02/08/2018  . TOTAL MASTECTOMY Left 02/08/2018   Procedure: TOTAL MASTECTOMY LEFT;  Surgeon: Fanny Skates, MD;  Location: [facility];  Service: General;  Laterality: Left;    Allergies  Allergen Reactions  . Amoxicillin-Pot Clavulanate Other (See Comments)    Upset stomach Has patient had a PCN reaction causing immediate rash, facial/tongue/throat swelling, SOB or lightheadedness with hypotension: No Has patient had a PCN reaction causing severe rash involving mucus membranes or skin necrosis: No Has patient had a PCN reaction that required hospitalization: No Has patient had a PCN reaction occurring within the last 10 years: No If all of the above answers are "NO", then may proceed with Cephalosporin use.   Marland Kitchen Morphine And Related Nausea Only    Outpatient Encounter Medications as of 04/04/2018  Medication Sig  . exemestane (AROMASIN) 25 MG tablet Take 1 tablet (25 mg total) by mouth daily after breakfast.  . metoprolol succinate (TOPROL-XL) 50 MG 24 hr tablet Take 50 mg by mouth daily. Take with or immediately following a meal.  . traMADol (ULTRAM) 50 MG tablet Take 50 mg by mouth 3 (three) times daily as needed (FOR PAIN).   . [DISCONTINUED] acetaminophen (TYLENOL) 650 MG CR tablet Take 650 mg by mouth every 4 (four) hours as needed for pain.  . [DISCONTINUED] alum & mag hydroxide-simeth (MAALOX/MYLANTA) 200-200-20 MG/5ML suspension Take 30 mLs by mouth every 4 (four) hours as needed for indigestion or heartburn.  . [DISCONTINUED] Calcium Carb-Cholecalciferol (CALTRATE 600+D3 PO) Take 1 tablet by mouth daily.  . [DISCONTINUED] diclofenac sodium  (VOLTAREN) 1 % GEL Apply 2 g topically 3 (three) times daily as needed (APPLY TO SHOULDER AND LEFT KNEE  FOR PAIN).   . [DISCONTINUED] guaiFENesin (MUCINEX) 600 MG 12 hr tablet Take 600 mg by mouth 2 (two) times daily.  . [DISCONTINUED] ipratropium-albuterol (DUONEB) 0.5-2.5 (3) MG/3ML SOLN Take 3 mLs by nebulization every 8 (eight) hours.  . [DISCONTINUED] promethazine (PHENERGAN) 25 MG tablet Take 25 mg by mouth every 6 (six) hours as needed for nausea or vomiting.  . [DISCONTINUED] Turmeric 500 MG TABS Take 500 mg by mouth daily.   No facility-administered encounter medications on file as of 04/04/2018.    ROS was provided with assistance of staff and daughter [name].  Review of Systems  Constitutional: Positive for activity change, appetite change and fatigue. Negative for chills, diaphoresis and fever.  HENT: Positive for hearing loss. Negative for congestion.   Respiratory: Positive for cough and shortness of breath. Negative for chest tightness and wheezing.   Cardiovascular: Positive for leg swelling. Negative for chest pain and  palpitations.  Gastrointestinal: Positive for abdominal distention, nausea and vomiting. Negative for abdominal pain, constipation and diarrhea.  Genitourinary: Negative for difficulty urinating and urgency.  Musculoskeletal: Positive for gait problem.  Skin: Negative for color change.  Neurological: Negative for tremors, facial asymmetry, speech difficulty and headaches.  Psychiatric/Behavioral: Positive for confusion. Negative for behavioral problems. The patient is not nervous/anxious.     Immunization History  Administered Date(s) Administered  . Influenza Split 07/24/2011, 06/20/2012  . Influenza Whole 10/19/2001, 07/25/2007, 08/02/2008, 07/18/2009, 07/10/2010  . Influenza,inj,Quad PF,6+ Mos 07/18/2013, 08/27/2014  . Influenza-Unspecified 07/31/2015, 08/19/2016, 08/09/2017  . Pneumococcal Conjugate-13 08/16/2017  . Pneumococcal Polysaccharide-23  10/19/1997, 01/17/2010  . Td 10/19/1996, 07/25/2007   Pertinent  Health Maintenance Due  Topic Date Due  . INFLUENZA VACCINE  05/19/2018  . DEXA SCAN  Completed  . PNA vac Low Risk Adult  Completed   Fall Risk  07/01/2017 02/22/2017 05/22/2015 03/22/2015 08/02/2014  Falls in the past year? No No No No No   Functional Status Survey:    Vitals:   04/04/18 0909  BP: 140/78  Pulse: 69  Resp: 18  Temp: 99.3 F (37.4 C)  SpO2: 97%  Weight: 124 lb (56.2 kg)  Height: 5' (1.524 m)   Body mass index is 24.22 kg/m. Physical Exam  Constitutional: She appears well-developed and well-nourished.  HENT:  Head: Normocephalic and atraumatic.  Eyes: Pupils are equal, round, and reactive to light. EOM are normal.  Neck: Normal range of motion. Neck supple. No JVD present. No thyromegaly present.  Cardiovascular: Normal rate.  Murmur heard. Irregular heart beats.   Pulmonary/Chest: No respiratory distress. She has no wheezes. She has rales.  Decreased air entry, rales bibasilar.   Abdominal: Soft. Bowel sounds are normal. She exhibits distension. There is no tenderness. There is no rebound and no guarding.  Musculoskeletal: She exhibits edema.  Trace dependent edema RLE from her lying on the right side.   Neurological: She is alert. No cranial nerve deficit. She exhibits normal muscle tone. Coordination normal.  Oriented to person and place.   Skin: Skin is warm and dry. She is not diaphoretic.  Left mastectomy scar. Right nipple surgically removed.   Psychiatric: She has a normal mood and affect. Her behavior is normal.    Labs reviewed: Recent Labs    03/30/18 1327 03/31/18 0406 04/01/18 0349  NA 128* 134* 135  K 3.0* 4.9 3.8  CL 93* 102 107  CO2 26 20* 23  GLUCOSE 107* 153* 102*  BUN 5* 6 9  CREATININE 0.64 0.76 0.64  CALCIUM 8.4* 8.5* 7.7*  MG  --  1.8  --    Recent Labs    02/03/18 1344 02/17/18 03/04/18 1034 03/30/18 03/30/18 1327  AST 45* 37 37* 38* 46*  ALT 45 32 35  27 34  ALKPHOS 92 71 92 76 69  BILITOT 0.8 0.7 0.6  --  0.7  PROT 6.3*  --  6.6  --  6.1*  ALBUMIN 3.4* 3.7 3.3* 3.2 3.0*   Recent Labs    02/03/18 1344  03/04/18 1034  03/30/18 1327 03/31/18 0406 04/01/18 0349  WBC 7.1   < > 5.8   < > 8.8 8.2 9.5  NEUTROABS 6.0  --  4.0  --  6.1  --   --   HGB 12.6   < > 12.1   < > 12.3 12.5 10.8*  HCT 39.5   < > 37.1   < > 36.4 37.2 32.6*  MCV  89.4   < > 85.9  --  81.8 81.6 83.0  PLT 225   < > 198   < > 188 247 223   < > = values in this interval not displayed.   Lab Results  Component Value Date   TSH 0.719 03/31/2018   Lab Results  Component Value Date   HGBA1C 6.1 (H) 02/09/2018   Lab Results  Component Value Date   CHOL 194 07/25/2007   HDL 62.7 07/25/2007   LDLCALC 118 (H) 07/25/2007   TRIG 69 07/25/2007   CHOLHDL 3.1 CALC 07/25/2007    Significant Diagnostic Results in last 30 days:  Dg Chest 2 View  Result Date: 03/30/2018 CLINICAL DATA:  Chest tightness with cough. EXAM: CHEST - 2 VIEW COMPARISON:  PET-CT 10/15/2017.  Chest x-ray 02/27/2013 FINDINGS: 1151 hours. Lungs are hyperexpanded. The lungs are clear without focal pneumonia, edema, pneumothorax or pleural effusion. Interstitial markings are diffusely coarsened with chronic features. Left suprahilar lesion compatible with the abnormality seen on previous PET-CT. Calcified granuloma noted in the medial parahilar right lung. The cardiopericardial silhouette is within normal limits for size. Left permanent pacemaker again noted. Telemetry leads overlie the chest. IMPRESSION: 1. Hyperexpansion without acute cardiopulmonary findings. 2. Left suprahilar opacity compatible with left upper lobe lesion seen on previous PET-CT. Electronically Signed   By: Misty Stanley M.D.   On: 03/30/2018 12:41   Ct Angio Chest Pe W/cm &/or Wo Cm  Result Date: 03/30/2018 CLINICAL DATA:  Upper respiratory infection.  Coughing.  Wheezing. EXAM: CT ANGIOGRAPHY CHEST WITH CONTRAST TECHNIQUE:  Multidetector CT imaging of the chest was performed using the standard protocol during bolus administration of intravenous contrast. Multiplanar CT image reconstructions and MIPs were obtained to evaluate the vascular anatomy. CONTRAST:  111m ISOVUE-370 IOPAMIDOL (ISOVUE-370) INJECTION 76% COMPARISON:  PET-CT 10/15/2017 FINDINGS: Cardiovascular: LEFT-sided pacemaker. No significant vascular findings. Normal heart size. No pericardial effusion. Port in the anterior chest wall with tip in distal SVC. Mediastinum/Nodes: No axillary or supraclavicular adenopathy. Nodular enlargement of the LEFT lobe of the thyroid gland nodules measure 2 cm. These nodules were not hypermetabolic comparison PET-CT scan. No mediastinal hilar adenopathy.  Small hiatal hernia. Lungs/Pleura: Small bilateral pleural effusions. Calcified scar in the LEFT lower lobe following LEFT lower lobe lobectomy. Calcified granuloma in the RIGHT upper lobe. No suspicious pulmonary nodules. A ground-glass opacity in the superior segment of the posterior LEFT upper lobe measures 3.8 by 2.2 cm unchanged (image 41/10. This mixed density nodule was mildly hypermetabolic on comparison PET-CT scan. Upper Abdomen: Limited view of the liver, kidneys, pancreas are unremarkable. Normal adrenal glands. Musculoskeletal: Stable sclerotic lesion in the T9 vertebral body. Seroma in the LEFT breast at mastectomy site. Review of the MIP images confirms the above findings. IMPRESSION: 1. Elongated sub solid lesion in the LEFT upper lobe unchanged from comparison PET and CT scan. 2. Bilateral small effusions. 3. Small hiatal hernia. Electronically Signed   By: SSuzy BouchardM.D.   On: 03/30/2018 16:16    Assessment/Plan Adult failure to thrive Advanced age, hemotherapy for breast cancer, Hx of lung cancer are contributory, Hospice referral, Oxycodone '5mg'$  q6h prn, Zofran '4mg'$  q6h prn, needs ACP, Dc Tumeric, Voltaren ge, Caltrate. Observe.   Dysphagia Dysphagia III  diet, risk for aspiration  Atrial fibrillation, new onset ([place] Heart rate is in control, continue Metoprolol, will delay f/u EKG since the patient desires comfort measures.   ACP (advance care planning) Goals of care discussion: reviewed goals of care  with the patient, her two daughters, one is on face time. Social worker and nurse supervision also present. DNR form reviewed. Went over and filled out MOST for between 11:05 am to 11:30 am. Patient wounld like to be DNR when the patient has no pulse and is not breathing. She desires comfort measures, do not transfer to hospital unless comfort needs cannot be met in current location. She agrees determine [place] or limitation of antibiotics when infection occurs. No IV fluids or feeding tube. Form signed by the patient's representative, daughter, [name]. Questions were answered. Copies made for chart and family.   Stage IV breast cancer in female [facility]) Will delay oncology f/u for further treatment. The patient desires comfort measures, no hospitalization, diagnostic testing/workups presently.      Family/ staff Communication: plan of care reviewed with the patient, POA daughter, and charge nurse  Labs/tests ordered:  none  Time spend 25 minutes.

## 2018-04-04 NOTE — Assessment & Plan Note (Addendum)
Heart rate is in control, continue Metoprolol, will delay f/u EKG since the patient desires comfort measures.

## 2018-04-04 NOTE — Assessment & Plan Note (Signed)
Dysphagia III diet, risk for aspiration

## 2018-04-04 NOTE — Assessment & Plan Note (Addendum)
Goals of care discussion: reviewed goals of care with the patient, her two daughters, one is on face time. Social worker and nurse supervision also present. DNR form reviewed. Went over and filled out MOST for between 11:05 am to 11:30 am. Patient wounld like to be DNR when the patient has no pulse and is not breathing. She desires comfort measures, do not transfer to hospital unless comfort needs cannot be met in current location. She agrees determine [place] or limitation of antibiotics when infection occurs. No IV fluids or feeding tube. Form signed by the patient's representative, daughter, [name]. Questions were answered. Copies made for chart and family.

## 2018-04-05 ENCOUNTER — Encounter: Payer: Self-pay | Admitting: Internal Medicine

## 2018-04-05 DIAGNOSIS — R1312 Dysphagia, oropharyngeal phase: Secondary | ICD-10-CM | POA: Diagnosis not present

## 2018-04-05 DIAGNOSIS — R1310 Dysphagia, unspecified: Secondary | ICD-10-CM | POA: Diagnosis not present

## 2018-04-05 DIAGNOSIS — Z4889 Encounter for other specified surgical aftercare: Secondary | ICD-10-CM | POA: Diagnosis not present

## 2018-04-05 DIAGNOSIS — C7951 Secondary malignant neoplasm of bone: Secondary | ICD-10-CM | POA: Diagnosis not present

## 2018-04-05 DIAGNOSIS — R2681 Unsteadiness on feet: Secondary | ICD-10-CM | POA: Diagnosis not present

## 2018-04-05 DIAGNOSIS — G9340 Encephalopathy, unspecified: Secondary | ICD-10-CM | POA: Diagnosis not present

## 2018-04-05 DIAGNOSIS — Z902 Acquired absence of lung [part of]: Secondary | ICD-10-CM | POA: Diagnosis not present

## 2018-04-05 DIAGNOSIS — Z7389 Other problems related to life management difficulty: Secondary | ICD-10-CM | POA: Diagnosis not present

## 2018-04-05 DIAGNOSIS — C50919 Malignant neoplasm of unspecified site of unspecified female breast: Secondary | ICD-10-CM | POA: Diagnosis not present

## 2018-04-05 DIAGNOSIS — J449 Chronic obstructive pulmonary disease, unspecified: Secondary | ICD-10-CM | POA: Diagnosis not present

## 2018-04-05 DIAGNOSIS — I4891 Unspecified atrial fibrillation: Secondary | ICD-10-CM | POA: Diagnosis not present

## 2018-04-05 DIAGNOSIS — Z950 Presence of cardiac pacemaker: Secondary | ICD-10-CM | POA: Diagnosis not present

## 2018-04-05 NOTE — Progress Notes (Signed)
This encounter was created in error - please disregard.

## 2018-04-05 NOTE — Assessment & Plan Note (Signed)
Will delay oncology f/u for further treatment. The patient desires comfort measures, no hospitalization, diagnostic testing/workups presently.

## 2018-04-05 NOTE — Addendum Note (Signed)
Addended by: Dorothea Ogle X on: 04/05/2018 03:46 PM   Modules accepted: Level of Service

## 2018-04-06 ENCOUNTER — Telehealth: Payer: Self-pay | Admitting: Adult Health

## 2018-04-06 ENCOUNTER — Ambulatory Visit: Payer: Medicare Other | Admitting: Adult Health

## 2018-04-06 DIAGNOSIS — I4891 Unspecified atrial fibrillation: Secondary | ICD-10-CM | POA: Diagnosis not present

## 2018-04-06 DIAGNOSIS — J449 Chronic obstructive pulmonary disease, unspecified: Secondary | ICD-10-CM | POA: Diagnosis not present

## 2018-04-06 DIAGNOSIS — C7951 Secondary malignant neoplasm of bone: Secondary | ICD-10-CM | POA: Diagnosis not present

## 2018-04-06 DIAGNOSIS — R1310 Dysphagia, unspecified: Secondary | ICD-10-CM | POA: Diagnosis not present

## 2018-04-06 DIAGNOSIS — G9340 Encephalopathy, unspecified: Secondary | ICD-10-CM | POA: Diagnosis not present

## 2018-04-06 DIAGNOSIS — C50919 Malignant neoplasm of unspecified site of unspecified female breast: Secondary | ICD-10-CM | POA: Diagnosis not present

## 2018-04-06 NOTE — Telephone Encounter (Signed)
Spoke with [name] this morning, as she had left a voice mail with me to please call her back.  [name] told me that she needed to cancel her mom's appointment for tomorrow.  Dot has not done well since discharge from the hospital and since her surgery.  Dot is now under hospice care.  I offered my sympathy to patient and family.  [name] was very appreciative and thanked [place] for our care over the past three years.  I sent a scheduling message to cancel her upcoming appointments.  Message forwarded to Dr. Jana Hakim.    Wilber Bihari, NP

## 2018-04-07 ENCOUNTER — Inpatient Hospital Stay: Payer: Medicare Other | Admitting: Adult Health

## 2018-04-08 ENCOUNTER — Other Ambulatory Visit: Payer: Self-pay | Admitting: Oncology

## 2018-04-08 DIAGNOSIS — C50919 Malignant neoplasm of unspecified site of unspecified female breast: Secondary | ICD-10-CM | POA: Diagnosis not present

## 2018-04-08 DIAGNOSIS — G9340 Encephalopathy, unspecified: Secondary | ICD-10-CM | POA: Diagnosis not present

## 2018-04-08 DIAGNOSIS — J449 Chronic obstructive pulmonary disease, unspecified: Secondary | ICD-10-CM | POA: Diagnosis not present

## 2018-04-08 DIAGNOSIS — I4891 Unspecified atrial fibrillation: Secondary | ICD-10-CM | POA: Diagnosis not present

## 2018-04-08 DIAGNOSIS — R1310 Dysphagia, unspecified: Secondary | ICD-10-CM | POA: Diagnosis not present

## 2018-04-08 DIAGNOSIS — C7951 Secondary malignant neoplasm of bone: Secondary | ICD-10-CM | POA: Diagnosis not present

## 2018-04-08 NOTE — Progress Notes (Signed)
Called.'s daughter [name] and updated the situation..Is currently not on steroids.  She had been on 5 mg of prednisone for a very long time.  If she is having pneumonitis that very low dose might be helpful.  If she is not having pneumonitis then it might take care of the issue of adrenal insufficiency.  In any case we made ourselves available to the patient and family and to her doctors at [facility] homes if we can be of any help.  At this point all her appointments here have been canceled and she is under care of hospice.

## 2018-04-11 DIAGNOSIS — C7951 Secondary malignant neoplasm of bone: Secondary | ICD-10-CM | POA: Diagnosis not present

## 2018-04-11 DIAGNOSIS — R1310 Dysphagia, unspecified: Secondary | ICD-10-CM | POA: Diagnosis not present

## 2018-04-11 DIAGNOSIS — J449 Chronic obstructive pulmonary disease, unspecified: Secondary | ICD-10-CM | POA: Diagnosis not present

## 2018-04-11 DIAGNOSIS — I4891 Unspecified atrial fibrillation: Secondary | ICD-10-CM | POA: Diagnosis not present

## 2018-04-11 DIAGNOSIS — G9340 Encephalopathy, unspecified: Secondary | ICD-10-CM | POA: Diagnosis not present

## 2018-04-11 DIAGNOSIS — C50919 Malignant neoplasm of unspecified site of unspecified female breast: Secondary | ICD-10-CM | POA: Diagnosis not present

## 2018-04-12 ENCOUNTER — Non-Acute Institutional Stay: Payer: Medicare Other | Admitting: Nurse Practitioner

## 2018-04-12 ENCOUNTER — Encounter: Payer: Self-pay | Admitting: Nurse Practitioner

## 2018-04-12 DIAGNOSIS — F418 Other specified anxiety disorders: Secondary | ICD-10-CM | POA: Diagnosis not present

## 2018-04-12 DIAGNOSIS — R0789 Other chest pain: Secondary | ICD-10-CM

## 2018-04-12 DIAGNOSIS — R627 Adult failure to thrive: Secondary | ICD-10-CM | POA: Diagnosis not present

## 2018-04-12 DIAGNOSIS — C50811 Malignant neoplasm of overlapping sites of right female breast: Secondary | ICD-10-CM

## 2018-04-12 DIAGNOSIS — Z170 Estrogen receptor positive status [ER+]: Secondary | ICD-10-CM | POA: Diagnosis not present

## 2018-04-12 DIAGNOSIS — C50919 Malignant neoplasm of unspecified site of unspecified female breast: Secondary | ICD-10-CM | POA: Diagnosis not present

## 2018-04-12 DIAGNOSIS — I10 Essential (primary) hypertension: Secondary | ICD-10-CM

## 2018-04-12 DIAGNOSIS — G8918 Other acute postprocedural pain: Secondary | ICD-10-CM | POA: Diagnosis not present

## 2018-04-12 DIAGNOSIS — I4891 Unspecified atrial fibrillation: Secondary | ICD-10-CM | POA: Diagnosis not present

## 2018-04-12 DIAGNOSIS — F419 Anxiety disorder, unspecified: Secondary | ICD-10-CM

## 2018-04-12 DIAGNOSIS — R112 Nausea with vomiting, unspecified: Secondary | ICD-10-CM

## 2018-04-12 DIAGNOSIS — F329 Major depressive disorder, single episode, unspecified: Secondary | ICD-10-CM | POA: Insufficient documentation

## 2018-04-12 DIAGNOSIS — J449 Chronic obstructive pulmonary disease, unspecified: Secondary | ICD-10-CM

## 2018-04-12 DIAGNOSIS — G9340 Encephalopathy, unspecified: Secondary | ICD-10-CM | POA: Diagnosis not present

## 2018-04-12 DIAGNOSIS — C7951 Secondary malignant neoplasm of bone: Secondary | ICD-10-CM | POA: Diagnosis not present

## 2018-04-12 DIAGNOSIS — R1310 Dysphagia, unspecified: Secondary | ICD-10-CM | POA: Diagnosis not present

## 2018-04-12 NOTE — Assessment & Plan Note (Signed)
No further oncology appointment. Dc Exemestane per POA's request. Observe.

## 2018-04-12 NOTE — Assessment & Plan Note (Signed)
Blood pressure is in control, continue Metoprolol

## 2018-04-12 NOTE — Progress Notes (Signed)
Location:  [facility] Room Number: 778 Place of Service:  ALF [phone]) Provider:  [name], ManXie  NP  Blanchie Serve, MD  Patient Care Team: Blanchie Serve, MD as PCP - General (Internal Medicine) Fanny Skates, MD as Consulting Physician (General Surgery) [name], [name], MD as Consulting Physician (Oncology) [name], MD as Consulting Physician (Radiation Oncology) Rockwell Germany, RN as Registered Nurse Mauro Kaufmann, RN as Registered Nurse Gentry Fitz, MD as Consulting Physician (Family Medicine) [name], [name], NP as Nurse Practitioner (Internal Medicine) Thompson Grayer, MD as Consulting Physician (Cardiology)  Extended Emergency Contact Information Primary Emergency Contact: Matthews,[name] Address: [address]          [place], [place] [address] [place] Mobile Phone: [phone] Relation: Daughter Secondary Emergency Contact: Clent Demark [place] Phone: [phone] Relation: Other  Code Status:  DNR Goals of care: Advanced Directive information Advanced Directives 03/30/2018  Does Patient Have a Medical Advance Directive? Yes  Type of Paramedic of [place];Out of facility DNR (pink MOST or yellow form)  Does patient want to make changes to medical advance directive? No - Patient declined  Copy of [place] in Chart? Yes  Would patient like information on creating a medical advance directive? -  Pre-existing out of facility DNR order (yellow form or pink MOST form) Yellow form placed in chart (order not valid for inpatient use)     Chief Complaint  Patient presents with  . Acute Visit    HPI:  Pt is a 84 y.o. female seen today for an acute visit for aches and pains in chest, not sure it was from her left mastectomy scar or acute chest, deep breath doesn't make it worse, truncal movement does, prn Oxycodone and Tramadol available and partially  effective. She has DOE, coarse breath sound on inspiratory phase, recently treated for pneumonitis. Hx of COPD and lung cancer. Per Oncology 04/08/18 Prednisone low does may benefit adrenal insufficiency. She has been on Prednisone 5mg  qd for a long time for her Hx of Temporal arteritis.  Her blood pressure is controlled on Metoprolol 50mg  daily. She has occasion vomiting, prn Ondansetron available to her. No further oncology appointment, POA desires to stop Exemestane to avoid possible adverse reactions since the patient's goal of care is comfort measures, under Hospice service.    Past Medical History:  Diagnosis Date  . Asthma    "as a child"  . Breast cancer ([facility]) 12/08/2014   Bilateral  . Cancer of central portion of female breast ([facility]) 12/04/2014  . Cancer of central portion of female breast ([facility]) 12/04/2014  . Complete heart block ([facility]) 1999   s/p PPM by Dr Olevia Perches, most recent generator change 2009  . Complication of anesthesia   . COPD (chronic obstructive pulmonary disease) ([facility])   . Dementia   . Diverticulosis   . DJD (degenerative joint disease)    RA  . Esophageal stricture   . Family history of adverse reaction to anesthesia    Daughter N/V  . GERD (gastroesophageal reflux disease)   . Giant cell arteritis ([facility])   . Goiter   . Hemorrhoids   . Hiatal hernia   . IBS (irritable bowel syndrome)   . Paroxysmal atrial fibrillation ([facility]) 12/17/2015   asymptomatic, <1%, determined on regular pacemaker interrogation  . Pneumonia   . PONV (postoperative nausea and vomiting)   . S/P radiation therapy 04/24/15 completed  T-11  . Wears glasses    Past Surgical History:  Procedure Laterality Date  . ANAL FISSURE REPAIR    . ARTERY BIOPSY Left 01/03/2013   Procedure: BIOPSY LEFT TEMPORAL ARTERY;  Surgeon: Ascencion Dike, MD;  Location: [facility];  Service: ENT;  Laterality: Left;  . BREAST BIOPSY Right   . CHOLECYSTECTOMY    . COLONOSCOPY    . FRACTURE SURGERY     fx  lt wrist  . HEMORRHOID SURGERY    . INSERT / REPLACE / [facility]   Gen change ([facility]) by Dr Olevia Perches 2009  . IR FLUORO GUIDE PORT INSERTION RIGHT  12/22/2017  . IR US GUIDE VASC ACCESS RIGHT  12/22/2017  . LUNG REMOVAL, PARTIAL  1950   left lower lobectomy for brochiectasis   . MASTECTOMY Left 02/08/2018  . MOUTH SURGERY     patient unaware  . RECTAL POLYPECTOMY    . RENAL CYST EXCISION    . TONSILLECTOMY    . TOTAL MASTECTOMY Left 02/08/2018  . TOTAL MASTECTOMY Left 02/08/2018   Procedure: TOTAL MASTECTOMY LEFT;  Surgeon: Fanny Skates, MD;  Location: [facility];  Service: General;  Laterality: Left;    Allergies  Allergen Reactions  . Amoxicillin-Pot Clavulanate Other (See Comments)    Upset stomach Has patient had a PCN reaction causing immediate rash, facial/tongue/throat swelling, SOB or lightheadedness with hypotension: No Has patient had a PCN reaction causing severe rash involving mucus membranes or skin necrosis: No Has patient had a PCN reaction that required hospitalization: No Has patient had a PCN reaction occurring within the last 10 years: No If all of the above answers are "NO", then may proceed with Cephalosporin use.   Marland Kitchen Morphine And Related Nausea Only    Outpatient Encounter Medications as of 04/12/2018  Medication Sig  . feeding supplement (BOOST / RESOURCE BREEZE) LIQD Take 1 Container by mouth 2 (two) times daily between meals.  . metoprolol succinate (TOPROL-XL) 50 MG 24 hr tablet Take 50 mg by mouth daily. Take with or immediately following a meal.  . ondansetron (ZOFRAN) 4 MG tablet Take 4 mg by mouth every 6 (six) hours as needed for nausea or vomiting.  Marland Kitchen oxycodone (OXY-IR) 5 MG capsule Take 5 mg by mouth every 6 (six) hours as needed.  . traMADol (ULTRAM) 50 MG tablet Take 50 mg by mouth 3 (three) times daily as needed (FOR PAIN).   Marland Kitchen exemestane (AROMASIN) 25 MG tablet Take 1 tablet (25 mg total) by mouth daily after breakfast.   No  facility-administered encounter medications on file as of 04/12/2018.     Review of Systems  Constitutional: Positive for activity change, appetite change and fatigue. Negative for chills, diaphoresis and fever.  HENT: Positive for hearing loss. Negative for trouble swallowing and voice change.   Respiratory: Positive for cough and shortness of breath. Negative for choking, chest tightness and wheezing.   Cardiovascular: Positive for chest pain. Negative for palpitations and leg swelling.  Gastrointestinal: Positive for nausea and vomiting. Negative for abdominal distention, abdominal pain and constipation.  Genitourinary: Negative for difficulty urinating, dysuria and urgency.  Musculoskeletal: Positive for arthralgias and gait problem.  Skin: Negative for color change and pallor.       Left mastectomy surgical scar  Neurological: Negative for dizziness, speech difficulty, weakness and headaches.       Memory lapses.   Psychiatric/Behavioral: Negative for agitation, behavioral problems, hallucinations and sleep disturbance.    Immunization  History  Administered Date(s) Administered  . Influenza Split 07/24/2011, 06/20/2012  . Influenza Whole 10/19/2001, 07/25/2007, 08/02/2008, 07/18/2009, 07/10/2010  . Influenza,inj,Quad PF,6+ Mos 07/18/2013, 08/27/2014  . Influenza-Unspecified 07/31/2015, 08/19/2016, 08/09/2017  . Pneumococcal Conjugate-13 08/16/2017  . Pneumococcal Polysaccharide-23 10/19/1997, 01/17/2010  . Td 10/19/1996, 07/25/2007   Pertinent  Health Maintenance Due  Topic Date Due  . INFLUENZA VACCINE  05/19/2018  . DEXA SCAN  Completed  . PNA vac Low Risk Adult  Completed   Fall Risk  07/01/2017 02/22/2017 05/22/2015 03/22/2015 08/02/2014  Falls in the past year? No No No No No   Functional Status Survey:    Vitals:   04/12/18 1044  BP: (!) 144/70  Pulse: 74  Resp: 18  Temp: 99.4 F (37.4 C)  SpO2: 96%  Weight: 124 lb (56.2 kg)  Height: 5' (1.524 m)   Body mass  index is 24.22 kg/m. Physical Exam  Constitutional: She appears well-developed and well-nourished. No distress.  HENT:  Head: Normocephalic and atraumatic.  Eyes: Pupils are equal, round, and reactive to light. EOM are normal.  Neck: Normal range of motion. Neck supple. No JVD present. No thyromegaly present.  Cardiovascular: Normal rate.  Murmur heard. Irregular heart beats. Pacemaker.   Pulmonary/Chest: Effort normal. She has no wheezes. She has no rales. She exhibits no tenderness.  Coarse breath sound on inspiratory phase.   Abdominal: Soft. Bowel sounds are normal. She exhibits no distension. There is no tenderness. There is no rebound and no guarding.  Neurological: She is alert. No cranial nerve deficit. She exhibits normal muscle tone. Coordination normal.  Oriented to person and place  Skin: Skin is warm and dry. She is not diaphoretic.  Left breast mastectomy scar  Psychiatric: She has a normal mood and affect. Her behavior is normal.    Labs reviewed: Recent Labs    03/30/18 1327 03/31/18 0406 04/01/18 0349  NA 128* 134* 135  K 3.0* 4.9 3.8  CL 93* 102 107  CO2 26 20* 23  GLUCOSE 107* 153* 102*  BUN 5* 6 9  CREATININE 0.64 0.76 0.64  CALCIUM 8.4* 8.5* 7.7*  MG  --  1.8  --    Recent Labs    02/03/18 1344 02/17/18 03/04/18 1034 03/30/18 03/30/18 1327  AST 45* 37 37* 38* 46*  ALT 45 32 35 27 34  ALKPHOS 92 71 92 76 69  BILITOT 0.8 0.7 0.6  --  0.7  PROT 6.3*  --  6.6  --  6.1*  ALBUMIN 3.4* 3.7 3.3* 3.2 3.0*   Recent Labs    02/03/18 1344  03/04/18 1034  03/30/18 1327 03/31/18 0406 04/01/18 0349  WBC 7.1   < > 5.8   < > 8.8 8.2 9.5  NEUTROABS 6.0  --  4.0  --  6.1  --   --   HGB 12.6   < > 12.1   < > 12.3 12.5 10.8*  HCT 39.5   < > 37.1   < > 36.4 37.2 32.6*  MCV 89.4   < > 85.9  --  81.8 81.6 83.0  PLT 225   < > 198   < > 188 247 223   < > = values in this interval not displayed.   Lab Results  Component Value Date   TSH 0.719 03/31/2018    Lab Results  Component Value Date   HGBA1C 6.1 (H) 02/09/2018   Lab Results  Component Value Date   CHOL 194 07/25/2007  HDL 62.7 07/25/2007   LDLCALC 118 (H) 07/25/2007   TRIG 69 07/25/2007   CHOLHDL 3.1 CALC 07/25/2007    Significant Diagnostic Results in last 30 days:  Dg Chest 2 View  Result Date: 03/30/2018 CLINICAL DATA:  Chest tightness with cough. EXAM: CHEST - 2 VIEW COMPARISON:  PET-CT 10/15/2017.  Chest x-ray 02/27/2013 FINDINGS: 1151 hours. Lungs are hyperexpanded. The lungs are clear without focal pneumonia, edema, pneumothorax or pleural effusion. Interstitial markings are diffusely coarsened with chronic features. Left suprahilar lesion compatible with the abnormality seen on previous PET-CT. Calcified granuloma noted in the medial parahilar right lung. The cardiopericardial silhouette is within normal limits for size. Left permanent pacemaker again noted. Telemetry leads overlie the chest. IMPRESSION: 1. Hyperexpansion without acute cardiopulmonary findings. 2. Left suprahilar opacity compatible with left upper lobe lesion seen on previous PET-CT. Electronically Signed   By: Misty Stanley M.D.   On: 03/30/2018 12:41   Ct Angio Chest Pe W/cm &/or Wo Cm  Result Date: 03/30/2018 CLINICAL DATA:  Upper respiratory infection.  Coughing.  Wheezing. EXAM: CT ANGIOGRAPHY CHEST WITH CONTRAST TECHNIQUE: Multidetector CT imaging of the chest was performed using the standard protocol during bolus administration of intravenous contrast. Multiplanar CT image reconstructions and MIPs were obtained to evaluate the vascular anatomy. CONTRAST:  168mL ISOVUE-370 IOPAMIDOL (ISOVUE-370) INJECTION 76% COMPARISON:  PET-CT 10/15/2017 FINDINGS: Cardiovascular: LEFT-sided pacemaker. No significant vascular findings. Normal heart size. No pericardial effusion. Port in the anterior chest wall with tip in distal SVC. Mediastinum/Nodes: No axillary or supraclavicular adenopathy. Nodular enlargement of the  LEFT lobe of the thyroid gland nodules measure 2 cm. These nodules were not hypermetabolic comparison PET-CT scan. No mediastinal hilar adenopathy.  Small hiatal hernia. Lungs/Pleura: Small bilateral pleural effusions. Calcified scar in the LEFT lower lobe following LEFT lower lobe lobectomy. Calcified granuloma in the RIGHT upper lobe. No suspicious pulmonary nodules. A ground-glass opacity in the superior segment of the posterior LEFT upper lobe measures 3.8 by 2.2 cm unchanged (image 41/10. This mixed density nodule was mildly hypermetabolic on comparison PET-CT scan. Upper Abdomen: Limited view of the liver, kidneys, pancreas are unremarkable. Normal adrenal glands. Musculoskeletal: Stable sclerotic lesion in the T9 vertebral body. Seroma in the LEFT breast at mastectomy site. Review of the MIP images confirms the above findings. IMPRESSION: 1. Elongated sub solid lesion in the LEFT upper lobe unchanged from comparison PET and CT scan. 2. Bilateral small effusions. 3. Small hiatal hernia. Electronically Signed   By: Suzy Bouchard M.D.   On: 03/30/2018 16:16    Assessment/Plan Atrial fibrillation, new onset (HCC) Heart rate is in control, continue Metoprolol   Essential hypertension Blood pressure is in control, continue Metoprolol   COPD (chronic obstructive pulmonary disease) (HCC) Resume Prednisone 5mg  daily.   Adult failure to thrive Continue supportive care, Hospice service, schedule Tramadol tid, continue prn Oxycodone, schedule Zofran tid.  Chest wall pain following surgery Recent left mastectomy, pneumonitis, metastatic breast cancer can be contributory, will schedule Tramadol tid, continue  Oxycodone prn. Resume Prednisone 5mg  daily may beneficial. Observe.   Malignant neoplasm of overlapping sites of right breast in female, estrogen receptor positive ([facility]) No further oncology appointment. Dc Exemestane per POA's request. Observe.   Nausea & vomiting Will schedule Zofran tid for  symptomatic management  Anxiety about health Will have prn Lorazepam available to her, goal of care is comfort measures.      Family/ staff Communication: plan of care reviewed with the patient, Hospice nurse, and charge  nurse.   Labs/tests ordered:  none  Time spend 25 minutes.

## 2018-04-12 NOTE — Assessment & Plan Note (Signed)
Will schedule Zofran tid for symptomatic management

## 2018-04-12 NOTE — Assessment & Plan Note (Addendum)
Continue supportive care, Hospice service, schedule Tramadol tid, continue prn Oxycodone, schedule Zofran tid.

## 2018-04-12 NOTE — Assessment & Plan Note (Signed)
Heart rate is in control, continue Metoprolol

## 2018-04-12 NOTE — Assessment & Plan Note (Signed)
Will have prn Lorazepam available to her, goal of care is comfort measures.

## 2018-04-12 NOTE — Assessment & Plan Note (Addendum)
Recent left mastectomy, pneumonitis, metastatic breast cancer can be contributory, will schedule Tramadol tid, continue  Oxycodone prn. Resume Prednisone 5mg  daily may beneficial. Observe.

## 2018-04-12 NOTE — Assessment & Plan Note (Signed)
Resume Prednisone 5mg  daily.

## 2018-04-13 DIAGNOSIS — C7951 Secondary malignant neoplasm of bone: Secondary | ICD-10-CM | POA: Diagnosis not present

## 2018-04-13 DIAGNOSIS — I4891 Unspecified atrial fibrillation: Secondary | ICD-10-CM | POA: Diagnosis not present

## 2018-04-13 DIAGNOSIS — J449 Chronic obstructive pulmonary disease, unspecified: Secondary | ICD-10-CM | POA: Diagnosis not present

## 2018-04-13 DIAGNOSIS — G9340 Encephalopathy, unspecified: Secondary | ICD-10-CM | POA: Diagnosis not present

## 2018-04-13 DIAGNOSIS — C50919 Malignant neoplasm of unspecified site of unspecified female breast: Secondary | ICD-10-CM | POA: Diagnosis not present

## 2018-04-13 DIAGNOSIS — R1310 Dysphagia, unspecified: Secondary | ICD-10-CM | POA: Diagnosis not present

## 2018-04-15 DIAGNOSIS — G9340 Encephalopathy, unspecified: Secondary | ICD-10-CM | POA: Diagnosis not present

## 2018-04-15 DIAGNOSIS — J449 Chronic obstructive pulmonary disease, unspecified: Secondary | ICD-10-CM | POA: Diagnosis not present

## 2018-04-15 DIAGNOSIS — R1310 Dysphagia, unspecified: Secondary | ICD-10-CM | POA: Diagnosis not present

## 2018-04-15 DIAGNOSIS — C50919 Malignant neoplasm of unspecified site of unspecified female breast: Secondary | ICD-10-CM | POA: Diagnosis not present

## 2018-04-15 DIAGNOSIS — C7951 Secondary malignant neoplasm of bone: Secondary | ICD-10-CM | POA: Diagnosis not present

## 2018-04-15 DIAGNOSIS — I4891 Unspecified atrial fibrillation: Secondary | ICD-10-CM | POA: Diagnosis not present

## 2018-04-18 DIAGNOSIS — C7951 Secondary malignant neoplasm of bone: Secondary | ICD-10-CM | POA: Diagnosis not present

## 2018-04-18 DIAGNOSIS — D630 Anemia in neoplastic disease: Secondary | ICD-10-CM | POA: Diagnosis not present

## 2018-04-18 DIAGNOSIS — K589 Irritable bowel syndrome without diarrhea: Secondary | ICD-10-CM | POA: Diagnosis not present

## 2018-04-18 DIAGNOSIS — N183 Chronic kidney disease, stage 3 (moderate): Secondary | ICD-10-CM | POA: Diagnosis not present

## 2018-04-18 DIAGNOSIS — R1310 Dysphagia, unspecified: Secondary | ICD-10-CM | POA: Diagnosis not present

## 2018-04-18 DIAGNOSIS — C50919 Malignant neoplasm of unspecified site of unspecified female breast: Secondary | ICD-10-CM | POA: Diagnosis not present

## 2018-04-18 DIAGNOSIS — J449 Chronic obstructive pulmonary disease, unspecified: Secondary | ICD-10-CM | POA: Diagnosis not present

## 2018-04-18 DIAGNOSIS — J301 Allergic rhinitis due to pollen: Secondary | ICD-10-CM | POA: Diagnosis not present

## 2018-04-18 DIAGNOSIS — K219 Gastro-esophageal reflux disease without esophagitis: Secondary | ICD-10-CM | POA: Diagnosis not present

## 2018-04-18 DIAGNOSIS — I4891 Unspecified atrial fibrillation: Secondary | ICD-10-CM | POA: Diagnosis not present

## 2018-04-18 DIAGNOSIS — G9340 Encephalopathy, unspecified: Secondary | ICD-10-CM | POA: Diagnosis not present

## 2018-04-20 DIAGNOSIS — I4891 Unspecified atrial fibrillation: Secondary | ICD-10-CM | POA: Diagnosis not present

## 2018-04-20 DIAGNOSIS — R1310 Dysphagia, unspecified: Secondary | ICD-10-CM | POA: Diagnosis not present

## 2018-04-20 DIAGNOSIS — G9340 Encephalopathy, unspecified: Secondary | ICD-10-CM | POA: Diagnosis not present

## 2018-04-20 DIAGNOSIS — C50919 Malignant neoplasm of unspecified site of unspecified female breast: Secondary | ICD-10-CM | POA: Diagnosis not present

## 2018-04-20 DIAGNOSIS — C7951 Secondary malignant neoplasm of bone: Secondary | ICD-10-CM | POA: Diagnosis not present

## 2018-04-20 DIAGNOSIS — J449 Chronic obstructive pulmonary disease, unspecified: Secondary | ICD-10-CM | POA: Diagnosis not present

## 2018-04-22 DIAGNOSIS — I4891 Unspecified atrial fibrillation: Secondary | ICD-10-CM | POA: Diagnosis not present

## 2018-04-22 DIAGNOSIS — R1310 Dysphagia, unspecified: Secondary | ICD-10-CM | POA: Diagnosis not present

## 2018-04-22 DIAGNOSIS — J449 Chronic obstructive pulmonary disease, unspecified: Secondary | ICD-10-CM | POA: Diagnosis not present

## 2018-04-22 DIAGNOSIS — C50919 Malignant neoplasm of unspecified site of unspecified female breast: Secondary | ICD-10-CM | POA: Diagnosis not present

## 2018-04-22 DIAGNOSIS — C7951 Secondary malignant neoplasm of bone: Secondary | ICD-10-CM | POA: Diagnosis not present

## 2018-04-22 DIAGNOSIS — G9340 Encephalopathy, unspecified: Secondary | ICD-10-CM | POA: Diagnosis not present

## 2018-04-25 ENCOUNTER — Non-Acute Institutional Stay: Payer: Medicare Other | Admitting: Nurse Practitioner

## 2018-04-25 ENCOUNTER — Encounter: Payer: Self-pay | Admitting: Nurse Practitioner

## 2018-04-25 DIAGNOSIS — C7951 Secondary malignant neoplasm of bone: Secondary | ICD-10-CM | POA: Diagnosis not present

## 2018-04-25 DIAGNOSIS — R413 Other amnesia: Secondary | ICD-10-CM

## 2018-04-25 DIAGNOSIS — M25531 Pain in right wrist: Secondary | ICD-10-CM | POA: Diagnosis not present

## 2018-04-25 DIAGNOSIS — C50919 Malignant neoplasm of unspecified site of unspecified female breast: Secondary | ICD-10-CM | POA: Diagnosis not present

## 2018-04-25 DIAGNOSIS — R1310 Dysphagia, unspecified: Secondary | ICD-10-CM | POA: Diagnosis not present

## 2018-04-25 DIAGNOSIS — J449 Chronic obstructive pulmonary disease, unspecified: Secondary | ICD-10-CM | POA: Diagnosis not present

## 2018-04-25 DIAGNOSIS — G9340 Encephalopathy, unspecified: Secondary | ICD-10-CM | POA: Diagnosis not present

## 2018-04-25 DIAGNOSIS — I4891 Unspecified atrial fibrillation: Secondary | ICD-10-CM | POA: Diagnosis not present

## 2018-04-25 DIAGNOSIS — M79641 Pain in right hand: Secondary | ICD-10-CM | POA: Diagnosis not present

## 2018-04-25 NOTE — Assessment & Plan Note (Signed)
Acute onset R wrist pain today, warmth, painful, and swelling in the area noted. Will obtain X-ray R wrist, CBC/diff, uric acid, ESR, RAF, CMP. Continue Prednisone, Tramadol, Oxycodone.

## 2018-04-25 NOTE — Progress Notes (Signed)
Location:  [facility] Room Number: 250 Place of Service:  ALF [phone]) Provider:  Dawnelle Warman, ManXie  NP  Blanchie Serve, MD  Patient Care Team: Blanchie Serve, MD as PCP - General (Internal Medicine) Fanny Skates, MD as Consulting Physician (General Surgery) [name], [name], MD as Consulting Physician (Oncology) [name], MD as Consulting Physician (Radiation Oncology) Rockwell Germany, RN as Registered Nurse Mauro Kaufmann, RN as Registered Nurse Gentry Fitz, MD as Consulting Physician (Family Medicine) Kuzey Ogata X, NP as Nurse Practitioner (Internal Medicine) Thompson Grayer, MD as Consulting Physician (Cardiology)  Extended Emergency Contact Information Primary Emergency Contact: Matthews,[name] Address: [address]          [place], [place] [address] [place] Mobile Phone: [phone] Relation: Daughter Secondary Emergency Contact: Clent Dem[place] Phone: [phone] Relation: Other  Code Status:  DNR Goals of care: Advanced Directive information Advanced Directives 04/25/2018  Does Patient Have a Medical Advance Directive? Yes  Type of Paramedic of [place];Out of facility DNR (pink MOST or yellow form)  Does patient want to make changes to medical advance directive? No - Patient declined  Copy of [place] in Chart? Yes  Would patient like information on creating a medical advance directive? -  Pre-existing out of facility DNR order (yellow form or pink MOST form) Yellow form placed in chart (order not valid for inpatient use)     Chief Complaint  Patient presents with  . Acute Visit    Constipation,(R0 arm pain, abd. pain    HPI:  Pt is a 84 y.o. female seen today for an acute visit for right wrist pain, duration is uncertain, she denied injury or hx of gout. HPI was provided with assistance of staff and family due to her memory lapses.     Past Medical History:  Diagnosis Date  . Asthma    "as a child"  . Breast cancer ([facility] [facility]) 12/08/2014   Bilateral  . Cancer of central portion of female breast ([facility]) 12/04/2014  . Cancer of central portion of female breast ([facility]) 12/04/2014  . Complete heart block ([place]) 1999   s/p PPM by Dr Olevia Perches, most recent generator change 2009  . Complication of anesthesia   . COPD (chronic obstructive pulmonary disease) ([facility])   . Dementia   . Diverticulosis   . DJD (degenerative joint disease)    RA  . Esophageal stricture   . Family history of adverse reaction to anesthesia    Daughter N/V  . GERD (gastroesophageal reflux disease)   . Giant cell arteritis ([facility])   . Goiter   . Hemorrhoids   . Hiatal hernia   . IBS (irritable bowel syndrome)   . Paroxysmal atrial fibrillation ([place] [facility]) 12/17/2015   asymptomatic, <1%, determined on regular pacemaker interrogation  . Pneumonia   . PONV (postoperative nausea and vomiting)   . S/P radiation therapy 04/24/15 completed   T-11  . Wears glasses    Past Surgical History:  Procedure Laterality Date  . ANAL FISSURE REPAIR    . ARTERY BIOPSY Left 01/03/2013   Procedure: BIOPSY LEFT TEMPORAL ARTERY;  Surgeon: Ascencion Dike, MD;  Location: [facility];  Service: ENT;  Laterality: Left;  . BREAST BIOPSY Right   . CHOLECYSTECTOMY    . COLONOSCOPY    . FRACTURE SURGERY     fx lt wrist  . HEMORRHOID SURGERY    . INSERT /  REPLACE / [facility]   Gen change ([facility]) by Dr Olevia Perches 2009  . IR FLUORO GUIDE PORT INSERTION RIGHT  12/22/2017  . IR US GUIDE VASC ACCESS RIGHT  12/22/2017  . LUNG REMOVAL, PARTIAL  1950   left lower lobectomy for brochiectasis   . MASTECTOMY Left 02/08/2018  . MOUTH SURGERY     patient unaware  . RECTAL POLYPECTOMY    . RENAL CYST EXCISION    . TONSILLECTOMY    . TOTAL MASTECTOMY Left 02/08/2018  . TOTAL MASTECTOMY Left 02/08/2018   Procedure: TOTAL MASTECTOMY LEFT;  Surgeon: Fanny Skates, MD;  Location: [facility];  Service: General;  Laterality: Left;    Allergies  Allergen Reactions  . Amoxicillin-Pot Clavulanate Other (See Comments)    Upset stomach Has patient had a PCN reaction causing immediate rash, facial/tongue/throat swelling, SOB or lightheadedness with hypotension: No Has patient had a PCN reaction causing severe rash involving mucus membranes or skin necrosis: No Has patient had a PCN reaction that required hospitalization: No Has patient had a PCN reaction occurring within the last 10 years: No If all of the above answers are "NO", then may proceed with Cephalosporin use.   Marland Kitchen Morphine And Related Nausea Only    Outpatient Encounter Medications as of 04/25/2018  Medication Sig  . feeding supplement (BOOST / RESOURCE BREEZE) LIQD Take 1 Container by mouth 2 (two) times daily between meals.  Marland Kitchen LORazepam (ATIVAN) 0.5 MG tablet Take 0.5 mg by mouth every 6 (six) hours as needed for anxiety.  . metoprolol succinate (TOPROL-XL) 50 MG 24 hr tablet Take 50 mg by mouth daily. Take with or immediately following a meal.  . ondansetron (ZOFRAN) 4 MG tablet Take 4 mg by mouth 3 (three) times daily.   Marland Kitchen oxycodone (OXY-IR) 5 MG capsule Take 5 mg by mouth every 6 (six) hours as needed.  . predniSONE (DELTASONE) 5 MG tablet Take 5 mg by mouth daily with breakfast.  . sennosides-docusate sodium (SENOKOT-S) 8.6-50 MG tablet Take 1 tablet by mouth at bedtime.  . traMADol (ULTRAM) 50 MG tablet Take 50 mg by mouth 3 (three) times daily as needed (FOR PAIN).   . [DISCONTINUED] bisacodyl (DULCOLAX) 10 MG suppository Place 10 mg rectally as needed for moderate constipation.  . [DISCONTINUED] exemestane (AROMASIN) 25 MG tablet Take 1 tablet (25 mg total) by mouth daily after breakfast.   No facility-administered encounter medications on file as of 04/25/2018.     Review of Systems  Constitutional: Positive for activity change, appetite change and fatigue. Negative for chills, diaphoresis and fever.  HENT:  Positive for hearing loss. Negative for congestion and voice change.   Respiratory: Positive for cough and shortness of breath.   Cardiovascular: Negative for chest pain, palpitations and leg swelling.  Gastrointestinal: Negative for abdominal distention and abdominal pain.  Genitourinary: Negative for difficulty urinating, dysuria and urgency.  Musculoskeletal: Positive for arthralgias, gait problem and joint swelling.       Right wrist swelling.   Skin: Positive for color change.       Right wrist redness  Neurological: Positive for dizziness. Negative for speech difficulty, weakness and headaches.       Memory lapses.   Psychiatric/Behavioral: Positive for confusion. Negative for agitation, behavioral problems, hallucinations and sleep disturbance. The patient is not nervous/anxious.     Immunization History  Administered Date(s) Administered  . Influenza Split 07/24/2011, 06/20/2012  . Influenza Whole 10/19/2001, 07/25/2007, 08/02/2008, 07/18/2009, 07/10/2010  . Influenza,inj,Quad  PF,6+ Mos 07/18/2013, 08/27/2014  . Influenza-Unspecified 07/31/2015, 08/19/2016, 08/09/2017  . Pneumococcal Conjugate-13 08/16/2017  . Pneumococcal Polysaccharide-23 10/19/1997, 01/17/2010  . Td 10/19/1996, 07/25/2007   Pertinent  Health Maintenance Due  Topic Date Due  . INFLUENZA VACCINE  05/19/2018  . DEXA SCAN  Completed  . PNA vac Low Risk Adult  Completed   Fall Risk  07/01/2017 02/22/2017 05/22/2015 03/22/2015 08/02/2014  Falls in the past year? No No No No No   Functional Status Survey:    Vitals:   04/25/18 1531  BP: 134/70  Pulse: 68  Resp: 18  Temp: 98.7 F (37.1 C)  SpO2: 90%  Weight: 118 lb 4.8 oz (53.7 kg)  Height: 5' (1.524 m)   Body mass index is 23.1 kg/m. Physical Exam  Constitutional: She appears well-developed and well-nourished.  HENT:  Head: Normocephalic and atraumatic.  Eyes: Pupils are equal, round, and reactive to light. EOM are normal.  Neck: Normal range of  motion. Neck supple. No JVD present.  Cardiovascular: Normal rate.  Murmur heard. Pulmonary/Chest: She has no wheezes. She has no rales.  Abdominal: Soft. She exhibits no distension. There is no tenderness.  Musculoskeletal: She exhibits edema and tenderness.  Assist with transfer. Swelling, redness, painful in the right wrist  Neurological: She is alert. No cranial nerve deficit. She exhibits normal muscle tone. Coordination normal.  Oriented to person and place.   Skin: Skin is warm and dry. There is erythema.  Right wrist redness, swelling, warmth  Psychiatric: She has a normal mood and affect. Her behavior is normal.    Labs reviewed: Recent Labs    03/30/18 1327 03/31/18 0406 04/01/18 0349  NA 128* 134* 135  K 3.0* 4.9 3.8  CL 93* 102 107  CO2 26 20* 23  GLUCOSE 107* 153* 102*  BUN 5* 6 9  CREATININE 0.64 0.76 0.64  CALCIUM 8.4* 8.5* 7.7*  MG  --  1.8  --    Recent Labs    02/03/18 1344 02/17/18 03/04/18 1034 03/30/18 03/30/18 1327  AST 45* 37 37* 38* 46*  ALT 45 32 35 27 34  ALKPHOS 92 71 92 76 69  BILITOT 0.8 0.7 0.6  --  0.7  PROT 6.3*  --  6.6  --  6.1*  ALBUMIN 3.4* 3.7 3.3* 3.2 3.0*   Recent Labs    02/03/18 1344  03/04/18 1034  03/30/18 1327 03/31/18 0406 04/01/18 0349  WBC 7.1   < > 5.8   < > 8.8 8.2 9.5  NEUTROABS 6.0  --  4.0  --  6.1  --   --   HGB 12.6   < > 12.1   < > 12.3 12.5 10.8*  HCT 39.5   < > 37.1   < > 36.4 37.2 32.6*  MCV 89.4   < > 85.9  --  81.8 81.6 83.0  PLT 225   < > 198   < > 188 247 223   < > = values in this interval not displayed.   Lab Results  Component Value Date   TSH 0.719 03/31/2018   Lab Results  Component Value Date   HGBA1C 6.1 (H) 02/09/2018   Lab Results  Component Value Date   CHOL 194 07/25/2007   HDL 62.7 07/25/2007   LDLCALC 118 (H) 07/25/2007   TRIG 69 07/25/2007   CHOLHDL 3.1 CALC 07/25/2007    Significant Diagnostic Results in last 30 days:  Dg Chest 2 View  Result Date:  03/30/2018  CLINICAL DATA:  Chest tightness with cough. EXAM: CHEST - 2 VIEW COMPARISON:  PET-CT 10/15/2017.  Chest x-ray 02/27/2013 FINDINGS: 1151 hours. Lungs are hyperexpanded. The lungs are clear without focal pneumonia, edema, pneumothorax or pleural effusion. Interstitial markings are diffusely coarsened with chronic features. Left suprahilar lesion compatible with the abnormality seen on previous PET-CT. Calcified granuloma noted in the medial parahilar right lung. The cardiopericardial silhouette is within normal limits for size. Left permanent pacemaker again noted. Telemetry leads overlie the chest. IMPRESSION: 1. Hyperexpansion without acute cardiopulmonary findings. 2. Left suprahilar opacity compatible with left upper lobe lesion seen on previous PET-CT. Electronically Signed   By: Misty Stanley M.D.   On: 03/30/2018 12:41   Ct Angio Chest Pe W/cm &/or Wo Cm  Result Date: 03/30/2018 CLINICAL DATA:  Upper respiratory infection.  Coughing.  Wheezing. EXAM: CT ANGIOGRAPHY CHEST WITH CONTRAST TECHNIQUE: Multidetector CT imaging of the chest was performed using the standard protocol during bolus administration of intravenous contrast. Multiplanar CT image reconstructions and MIPs were obtained to evaluate the vascular anatomy. CONTRAST:  155m ISOVUE-370 IOPAMIDOL (ISOVUE-370) INJECTION 76% COMPARISON:  PET-CT 10/15/2017 FINDINGS: Cardiovascular: LEFT-sided pacemaker. No significant vascular findings. Normal heart size. No pericardial effusion. Port in the anterior chest wall with tip in distal SVC. Mediastinum/Nodes: No axillary or supraclavicular adenopathy. Nodular enlargement of the LEFT lobe of the thyroid gland nodules measure 2 cm. These nodules were not hypermetabolic comparison PET-CT scan. No mediastinal hilar adenopathy.  Small hiatal hernia. Lungs/Pleura: Small bilateral pleural effusions. Calcified scar in the LEFT lower lobe following LEFT lower lobe lobectomy. Calcified granuloma in the  RIGHT upper lobe. No suspicious pulmonary nodules. A ground-glass opacity in the superior segment of the posterior LEFT upper lobe measures 3.8 by 2.2 cm unchanged (image 41/10. This mixed density nodule was mildly hypermetabolic on comparison PET-CT scan. Upper Abdomen: Limited view of the liver, kidneys, pancreas are unremarkable. Normal adrenal glands. Musculoskeletal: Stable sclerotic lesion in the T9 vertebral body. Seroma in the LEFT breast at mastectomy site. Review of the MIP images confirms the above findings. IMPRESSION: 1. Elongated sub solid lesion in the LEFT upper lobe unchanged from comparison PET and CT scan. 2. Bilateral small effusions. 3. Small hiatal hernia. Electronically Signed   By: SSuzy BouchardM.D.   On: 03/30/2018 16:16    Assessment/Plan Acute pain of right wrist Acute onset R wrist pain today, warmth, painful, and swelling in the area noted. Will obtain X-ray R wrist, CBC/diff, uric acid, ESR, RAF, CMP. Continue Prednisone, Tramadol, Oxycodone.   Memory loss Memory lapses, HPI was provided with assistance of staff and family, continue Hospice service.      Family/ staff Communication: plan of care reviewed with the patient and charge nurse.   Labs/tests ordered:  X-ray R wrist, CBC/diff, uric acid, ESR, RAF, CMP   Time spend 25 minutes.

## 2018-04-25 NOTE — Assessment & Plan Note (Signed)
Memory lapses, HPI was provided with assistance of staff and family, continue Hospice service.

## 2018-04-26 DIAGNOSIS — R1310 Dysphagia, unspecified: Secondary | ICD-10-CM | POA: Diagnosis not present

## 2018-04-26 DIAGNOSIS — G9340 Encephalopathy, unspecified: Secondary | ICD-10-CM | POA: Diagnosis not present

## 2018-04-26 DIAGNOSIS — C50919 Malignant neoplasm of unspecified site of unspecified female breast: Secondary | ICD-10-CM | POA: Diagnosis not present

## 2018-04-26 DIAGNOSIS — C7951 Secondary malignant neoplasm of bone: Secondary | ICD-10-CM | POA: Diagnosis not present

## 2018-04-26 DIAGNOSIS — I4891 Unspecified atrial fibrillation: Secondary | ICD-10-CM | POA: Diagnosis not present

## 2018-04-26 DIAGNOSIS — M25531 Pain in right wrist: Secondary | ICD-10-CM | POA: Diagnosis not present

## 2018-04-26 DIAGNOSIS — J449 Chronic obstructive pulmonary disease, unspecified: Secondary | ICD-10-CM | POA: Diagnosis not present

## 2018-04-26 LAB — CBC AND DIFFERENTIAL
HEMATOCRIT: 34 — AB (ref 36–46)
Hemoglobin: 11.4 — AB (ref 12.0–16.0)
Platelets: 284 (ref 150–399)
WBC: 10

## 2018-04-26 LAB — BASIC METABOLIC PANEL
BUN: 15 (ref 4–21)
CREATININE: 0.8 (ref 0.5–1.1)
Glucose: 99
Potassium: 4 (ref 3.4–5.3)
Sodium: 132 — AB (ref 137–147)

## 2018-04-26 LAB — HEPATIC FUNCTION PANEL
ALK PHOS: 70 (ref 25–125)
ALT: 20 (ref 7–35)
AST: 28 (ref 13–35)
BILIRUBIN, TOTAL: 1.1

## 2018-04-27 DIAGNOSIS — C7951 Secondary malignant neoplasm of bone: Secondary | ICD-10-CM | POA: Diagnosis not present

## 2018-04-27 DIAGNOSIS — I4891 Unspecified atrial fibrillation: Secondary | ICD-10-CM | POA: Diagnosis not present

## 2018-04-27 DIAGNOSIS — J449 Chronic obstructive pulmonary disease, unspecified: Secondary | ICD-10-CM | POA: Diagnosis not present

## 2018-04-27 DIAGNOSIS — C50919 Malignant neoplasm of unspecified site of unspecified female breast: Secondary | ICD-10-CM | POA: Diagnosis not present

## 2018-04-27 DIAGNOSIS — R1310 Dysphagia, unspecified: Secondary | ICD-10-CM | POA: Diagnosis not present

## 2018-04-27 DIAGNOSIS — G9340 Encephalopathy, unspecified: Secondary | ICD-10-CM | POA: Diagnosis not present

## 2018-04-28 ENCOUNTER — Non-Acute Institutional Stay: Payer: Medicare Other | Admitting: Internal Medicine

## 2018-04-28 ENCOUNTER — Encounter: Payer: Self-pay | Admitting: Internal Medicine

## 2018-04-28 DIAGNOSIS — M25531 Pain in right wrist: Secondary | ICD-10-CM

## 2018-04-28 DIAGNOSIS — M19039 Primary osteoarthritis, unspecified wrist: Secondary | ICD-10-CM

## 2018-04-28 DIAGNOSIS — E871 Hypo-osmolality and hyponatremia: Secondary | ICD-10-CM

## 2018-04-28 LAB — CMP 10231
Albumin: 3
CALCIUM: 8
Carbon Dioxide, Total: 30
Chloride: 95
EGFR (Non-African Amer.): 65
GLOBULIN: 2.1
SED RATE: 29
Total Protein: 5.1

## 2018-04-28 LAB — URIC ACID
Uric Acid: 4
Uric Acid: 4

## 2018-04-28 NOTE — Progress Notes (Signed)
Location:  [facility] Room Number: 591 Place of Service:  ALF [phone]) Provider:  Blanchie Serve, MD  Blanchie Serve, MD  Patient Care Team: Blanchie Serve, MD as PCP - General (Internal Medicine) Fanny Skates, MD as Consulting Physician (General Surgery) [name], [name], MD as Consulting Physician (Oncology) [name], MD as Consulting Physician (Radiation Oncology) Rockwell Germany, RN as Registered Nurse Mauro Kaufmann, RN as Registered Nurse Gentry Fitz, MD as Consulting Physician (Family Medicine) [name], [name], NP as Nurse Practitioner (Internal Medicine) Thompson Grayer, MD as Consulting Physician (Cardiology)  Extended Emergency Contact Information Primary Emergency Contact: Matthews,[name] Address: [address]          [place], [place] [address] [place] Phone: [phone] Relation: Daughter Secondary Emergency Contact: Clent Demark [place] Phone: [phone] Relation: Other  Code Status:  DNR  Goals of care: Advanced Directive information Advanced Directives 04/28/2018  Does Patient Have a Medical Advance Directive? Yes  Type of Paramedic of [place];Out of facility DNR (pink MOST or yellow form)  Does patient want to make changes to medical advance directive? No - Patient declined  Copy of [place] in Chart? Yes  Would patient like information on creating a medical advance directive? -  Pre-existing out of facility DNR order (yellow form or pink MOST form) Yellow form placed in chart (order not valid for inpatient use);Pink MOST form placed in chart (order not valid for inpatient use)     Chief Complaint  Patient presents with  . Acute Visit    Abnormal labs and right wrist pain     HPI:  Pt is a 84 y.o. female seen today for an acute visit for right wrist pain and abnormal labs. Per nursing she has been complaining of pain to right  wrist. No fall or trauma reported. Currently on tylenol 650 mg q4h prn and tramadol 50 mg tid for pain. Also on oxyIR 5 mg q6h prn pain. Per nursing her sodium level in blood work has resulted low. Pt seen in her room with charge nurse present. She ate her breakfast, appetite is good. Denies any pain at present.    Past Medical History:  Diagnosis Date  . Asthma    "as a child"  . Breast cancer ([place]) 12/08/2014   Bilateral  . Cancer of central portion of female breast ([facility]) 12/04/2014  . Cancer of central portion of female breast ([place]) 12/04/2014  . Complete heart block ([facility]) 1999   s/p PPM by Dr Olevia Perches, most recent generator change 2009  . Complication of anesthesia   . COPD (chronic obstructive pulmonary disease) ([place])   . Dementia   . Diverticulosis   . DJD (degenerative joint disease)    RA  . Esophageal stricture   . Family history of adverse reaction to anesthesia    Daughter N/V  . GERD (gastroesophageal reflux disease)   . Giant cell arteritis ([facility])   . Goiter   . Hemorrhoids   . Hiatal hernia   . IBS (irritable bowel syndrome)   . Paroxysmal atrial fibrillation ([facility]) 12/17/2015   asymptomatic, <1%, determined on regular pacemaker interrogation  . Pneumonia   . PONV (postoperative nausea and vomiting)   . S/P radiation therapy 04/24/15 completed   T-11  . Wears glasses    Past Surgical History:  Procedure Laterality Date  . ANAL FISSURE REPAIR    . ARTERY BIOPSY Left  01/03/2013   Procedure: BIOPSY LEFT TEMPORAL ARTERY;  Surgeon: Ascencion Dike, MD;  Location: [facility];  Service: ENT;  Laterality: Left;  . BREAST BIOPSY Right   . CHOLECYSTECTOMY    . COLONOSCOPY    . FRACTURE SURGERY     fx lt wrist  . HEMORRHOID SURGERY    . INSERT / REPLACE / [place]   Gen change ([facility]) by Dr Olevia Perches 2009  . IR FLUORO GUIDE PORT INSERTION RIGHT  12/22/2017  . IR US GUIDE VASC ACCESS RIGHT  12/22/2017  . LUNG REMOVAL, PARTIAL  1950   left lower lobectomy for  brochiectasis   . MASTECTOMY Left 02/08/2018  . MOUTH SURGERY     patient unaware  . RECTAL POLYPECTOMY    . RENAL CYST EXCISION    . TONSILLECTOMY    . TOTAL MASTECTOMY Left 02/08/2018  . TOTAL MASTECTOMY Left 02/08/2018   Procedure: TOTAL MASTECTOMY LEFT;  Surgeon: Fanny Skates, MD;  Location: [facility];  Service: General;  Laterality: Left;    Allergies  Allergen Reactions  . Amoxicillin-Pot Clavulanate Other (See Comments)    Upset stomach Has patient had a PCN reaction causing immediate rash, facial/tongue/throat swelling, SOB or lightheadedness with hypotension: No Has patient had a PCN reaction causing severe rash involving mucus membranes or skin necrosis: No Has patient had a PCN reaction that required hospitalization: No Has patient had a PCN reaction occurring within the last 10 years: No If all of the above answers are "NO", then may proceed with Cephalosporin use.   Marland Kitchen Morphine And Related Nausea Only    Outpatient Encounter Medications as of 04/28/2018  Medication Sig  . acetaminophen (TYLENOL) 325 MG tablet Take 650 mg by mouth every 4 (four) hours as needed.  . feeding supplement (BOOST / RESOURCE BREEZE) LIQD Take 1 Container by mouth 2 (two) times daily between meals.  Marland Kitchen LORazepam (ATIVAN) 0.5 MG tablet Take 0.5 mg by mouth every 6 (six) hours as needed for anxiety.  . metoprolol succinate (TOPROL-XL) 50 MG 24 hr tablet Take 50 mg by mouth daily. Take with or immediately following a meal.  . ondansetron (ZOFRAN) 4 MG tablet Take 4 mg by mouth 3 (three) times daily.   Marland Kitchen oxycodone (OXY-IR) 5 MG capsule Take 5 mg by mouth every 6 (six) hours as needed.  . predniSONE (DELTASONE) 5 MG tablet Take 5 mg by mouth daily with breakfast.  . sennosides-docusate sodium (SENOKOT-S) 8.6-50 MG tablet Take 2 tablets by mouth at bedtime.   . traMADol (ULTRAM) 50 MG tablet Take 50 mg by mouth 3 (three) times daily.    No facility-administered encounter medications on file as of  04/28/2018.     Review of Systems  Constitutional: Negative for appetite change, chills and fever.  HENT: Negative for congestion.   Respiratory: Negative for shortness of breath.   Cardiovascular: Negative for chest pain and palpitations.  Gastrointestinal: Negative for abdominal pain, nausea and vomiting.  Genitourinary: Negative for dysuria.  Musculoskeletal: Positive for gait problem. Negative for arthralgias.  Neurological: Negative for dizziness and headaches.  Psychiatric/Behavioral: Positive for confusion.    Immunization History  Administered Date(s) Administered  . Influenza Split 07/24/2011, 06/20/2012  . Influenza Whole 10/19/2001, 07/25/2007, 08/02/2008, 07/18/2009, 07/10/2010  . Influenza,inj,Quad PF,6+ Mos 07/18/2013, 08/27/2014  . Influenza-Unspecified 07/31/2015, 08/19/2016, 08/09/2017  . Pneumococcal Conjugate-13 08/16/2017  . Pneumococcal Polysaccharide-23 10/19/1997, 01/17/2010  . Td 10/19/1996, 07/25/2007   Pertinent  Health Maintenance Due  Topic  Date Due  . INFLUENZA VACCINE  05/19/2018  . DEXA SCAN  Completed  . PNA vac Low Risk Adult  Completed   Fall Risk  07/01/2017 02/22/2017 05/22/2015 03/22/2015 08/02/2014  Falls in the past year? No No No No No   Functional Status Survey:    Vitals:   04/28/18 1221  BP: 130/70  Pulse: 66  Resp: 18  Temp: 98.7 F (37.1 C)  TempSrc: Oral  SpO2: 90%  Weight: 118 lb 4.8 oz (53.7 kg)  Height: 5' (1.524 m)   Body mass index is 23.1 kg/m. Physical Exam  Constitutional: She appears well-developed and well-nourished. No distress.  HENT:  Head: Normocephalic and atraumatic.  Mouth/Throat: Oropharynx is clear and moist.  Eyes: Pupils are equal, round, and reactive to light. Conjunctivae are normal.  Neck: Normal range of motion. Neck supple.  Cardiovascular: Normal rate and regular rhythm.  Pulmonary/Chest: Effort normal and breath sounds normal.  Abdominal: Soft. Bowel sounds are normal. There is no tenderness.   Musculoskeletal:  Good ROM to right wrist and fingers. No joint swelling. Arthritis changes present.   Lymphadenopathy:    She has no cervical adenopathy.  Neurological: She is alert.  Oriented to self and place  Skin: Skin is warm and dry. She is not diaphoretic. No erythema.    Labs reviewed: Recent Labs    03/30/18 1327 03/31/18 0406 04/01/18 0349 04/26/18  NA 128* 134* 135 132*  K 3.0* 4.9 3.8 4.0  CL 93* 102 107 95  CO2 26 20* 23 30  GLUCOSE 107* 153* 102*  --   BUN 5* '6 9 15  '$ CREATININE 0.64 0.76 0.64 0.8  CALCIUM 8.4* 8.5* 7.7* 8.0  MG  --  1.8  --   --    Recent Labs    02/17/18 03/04/18 1034 03/30/18 03/30/18 1327 04/26/18  AST 37 37* 38* 46* 28  ALT 32 35 27 34 20  ALKPHOS 71 92 76 69 70  BILITOT 0.7 0.6  --  0.7  --   PROT  --  6.6  --  6.1* 5.1  ALBUMIN 3.7 3.3* 3.2 3.0* 3.0   Recent Labs    02/03/18 1344  03/04/18 1034  03/30/18 1327 03/31/18 0406 04/01/18 0349 04/26/18  WBC 7.1   < > 5.8   < > 8.8 8.2 9.5 10.0  NEUTROABS 6.0  --  4.0  --  6.1  --   --   --   HGB 12.6   < > 12.1   < > 12.3 12.5 10.8* 11.4*  HCT 39.5   < > 37.1   < > 36.4 37.2 32.6* 34*  MCV 89.4   < > 85.9  --  81.8 81.6 83.0  --   PLT 225   < > 198   < > 188 247 223 284   < > = values in this interval not displayed.   Lab Results  Component Value Date   TSH 0.719 03/31/2018   Lab Results  Component Value Date   HGBA1C 6.1 (H) 02/09/2018   Lab Results  Component Value Date   CHOL 194 07/25/2007   HDL 62.7 07/25/2007   LDLCALC 118 (H) 07/25/2007   TRIG 69 07/25/2007   CHOLHDL 3.1 CALC 07/25/2007   No results found for: [facility]  Lab Results  Component Value Date   ESRSEDRATE 29 04/26/2018   04/26/18 uric acid 4  Significant Diagnostic Results in last 30 days:  Dg Chest 2 View  Result  Date: 03/30/2018 CLINICAL DATA:  Chest tightness with cough. EXAM: CHEST - 2 VIEW COMPARISON:  PET-CT 10/15/2017.  Chest x-ray 02/27/2013 FINDINGS: 1151 hours. Lungs are  hyperexpanded. The lungs are clear without focal pneumonia, edema, pneumothorax or pleural effusion. Interstitial markings are diffusely coarsened with chronic features. Left suprahilar lesion compatible with the abnormality seen on previous PET-CT. Calcified granuloma noted in the medial parahilar right lung. The cardiopericardial silhouette is within normal limits for size. Left permanent pacemaker again noted. Telemetry leads overlie the chest. IMPRESSION: 1. Hyperexpansion without acute cardiopulmonary findings. 2. Left suprahilar opacity compatible with left upper lobe lesion seen on previous PET-CT. Electronically Signed   By: Misty Stanley M.D.   On: 03/30/2018 12:41   Ct Angio Chest Pe W/cm &/or Wo Cm  Result Date: 03/30/2018 CLINICAL DATA:  Upper respiratory infection.  Coughing.  Wheezing. EXAM: CT ANGIOGRAPHY CHEST WITH CONTRAST TECHNIQUE: Multidetector CT imaging of the chest was performed using the standard protocol during bolus administration of intravenous contrast. Multiplanar CT image reconstructions and MIPs were obtained to evaluate the vascular anatomy. CONTRAST:  1103m ISOVUE-370 IOPAMIDOL (ISOVUE-370) INJECTION 76% COMPARISON:  PET-CT 10/15/2017 FINDINGS: Cardiovascular: LEFT-sided pacemaker. No significant vascular findings. Normal heart size. No pericardial effusion. Port in the anterior chest wall with tip in distal SVC. Mediastinum/Nodes: No axillary or supraclavicular adenopathy. Nodular enlargement of the LEFT lobe of the thyroid gland nodules measure 2 cm. These nodules were not hypermetabolic comparison PET-CT scan. No mediastinal hilar adenopathy.  Small hiatal hernia. Lungs/Pleura: Small bilateral pleural effusions. Calcified scar in the LEFT lower lobe following LEFT lower lobe lobectomy. Calcified granuloma in the RIGHT upper lobe. No suspicious pulmonary nodules. A ground-glass opacity in the superior segment of the posterior LEFT upper lobe measures 3.8 by 2.2 cm unchanged  (image 41/10. This mixed density nodule was mildly hypermetabolic on comparison PET-CT scan. Upper Abdomen: Limited view of the liver, kidneys, pancreas are unremarkable. Normal adrenal glands. Musculoskeletal: Stable sclerotic lesion in the T9 vertebral body. Seroma in the LEFT breast at mastectomy site. Review of the MIP images confirms the above findings. IMPRESSION: 1. Elongated sub solid lesion in the LEFT upper lobe unchanged from comparison PET and CT scan. 2. Bilateral small effusions. 3. Small hiatal hernia. Electronically Signed   By: SSuzy BouchardM.D.   On: 03/30/2018 16:16    Assessment/Plan  1. Right wrist pain Pain free at present. Good ROM. Has Arthritis changes to hand and fingers. Supportive care. Reviewed labs including ESR and uric acid level, normal  2. Hyponatremia Low but improved sodium level from 127 to 132. Maintain hydration  3. Localized osteoarthritis of wrist Denies pain at present. On tramadol scheduled for pain. Advised staff to provide prn tylenol if needed for pain. If swelling, redness or tenderness noted on exam, to notify for reassessment.    Family/ staff Communication: reviewed care plan with patient and charge nurse.    Labs/tests ordered:  none  MBlanchie Serve MD Internal Medicine PProvidence Alaska Medical CenterGroup [address] [place] [id number] Phone (Monday-Friday 8 am - 5 pm): [phone] Call: [phone] follow prompts after 5 pm and on weekends Office Phone: [phone] Fax: [phone]

## 2018-04-29 ENCOUNTER — Ambulatory Visit: Payer: Medicare Other

## 2018-04-29 ENCOUNTER — Ambulatory Visit: Payer: Medicare Other | Admitting: Oncology

## 2018-04-29 ENCOUNTER — Other Ambulatory Visit: Payer: Medicare Other

## 2018-04-29 DIAGNOSIS — G9340 Encephalopathy, unspecified: Secondary | ICD-10-CM | POA: Diagnosis not present

## 2018-04-29 DIAGNOSIS — I4891 Unspecified atrial fibrillation: Secondary | ICD-10-CM | POA: Diagnosis not present

## 2018-04-29 DIAGNOSIS — R1310 Dysphagia, unspecified: Secondary | ICD-10-CM | POA: Diagnosis not present

## 2018-04-29 DIAGNOSIS — J449 Chronic obstructive pulmonary disease, unspecified: Secondary | ICD-10-CM | POA: Diagnosis not present

## 2018-04-29 DIAGNOSIS — C50919 Malignant neoplasm of unspecified site of unspecified female breast: Secondary | ICD-10-CM | POA: Diagnosis not present

## 2018-04-29 DIAGNOSIS — C7951 Secondary malignant neoplasm of bone: Secondary | ICD-10-CM | POA: Diagnosis not present

## 2018-05-02 DIAGNOSIS — M25531 Pain in right wrist: Secondary | ICD-10-CM | POA: Diagnosis not present

## 2018-05-02 DIAGNOSIS — C50919 Malignant neoplasm of unspecified site of unspecified female breast: Secondary | ICD-10-CM | POA: Diagnosis not present

## 2018-05-02 DIAGNOSIS — C7951 Secondary malignant neoplasm of bone: Secondary | ICD-10-CM | POA: Diagnosis not present

## 2018-05-02 DIAGNOSIS — I10 Essential (primary) hypertension: Secondary | ICD-10-CM | POA: Diagnosis not present

## 2018-05-02 DIAGNOSIS — J449 Chronic obstructive pulmonary disease, unspecified: Secondary | ICD-10-CM | POA: Diagnosis not present

## 2018-05-02 DIAGNOSIS — I4891 Unspecified atrial fibrillation: Secondary | ICD-10-CM | POA: Diagnosis not present

## 2018-05-02 DIAGNOSIS — G9340 Encephalopathy, unspecified: Secondary | ICD-10-CM | POA: Diagnosis not present

## 2018-05-02 DIAGNOSIS — R1310 Dysphagia, unspecified: Secondary | ICD-10-CM | POA: Diagnosis not present

## 2018-05-02 LAB — BASIC METABOLIC PANEL
BUN: 10 (ref 4–21)
Creatinine: 0.8 (ref 0.5–1.1)
GLUCOSE: 84
Potassium: 4.6 (ref 3.4–5.3)
SODIUM: 136 — AB (ref 137–147)

## 2018-05-03 ENCOUNTER — Other Ambulatory Visit (HOSPITAL_COMMUNITY): Payer: Medicare Other

## 2018-05-03 LAB — BASIC METABOLIC PANEL
Calcium: 8.9
Carbon Dioxide, Total: 32
Chloride: 99
EGFR (Non-African Amer.): 69

## 2018-05-04 DIAGNOSIS — C50919 Malignant neoplasm of unspecified site of unspecified female breast: Secondary | ICD-10-CM | POA: Diagnosis not present

## 2018-05-04 DIAGNOSIS — G9340 Encephalopathy, unspecified: Secondary | ICD-10-CM | POA: Diagnosis not present

## 2018-05-04 DIAGNOSIS — I4891 Unspecified atrial fibrillation: Secondary | ICD-10-CM | POA: Diagnosis not present

## 2018-05-04 DIAGNOSIS — R1310 Dysphagia, unspecified: Secondary | ICD-10-CM | POA: Diagnosis not present

## 2018-05-04 DIAGNOSIS — J449 Chronic obstructive pulmonary disease, unspecified: Secondary | ICD-10-CM | POA: Diagnosis not present

## 2018-05-04 DIAGNOSIS — C7951 Secondary malignant neoplasm of bone: Secondary | ICD-10-CM | POA: Diagnosis not present

## 2018-05-06 DIAGNOSIS — C50919 Malignant neoplasm of unspecified site of unspecified female breast: Secondary | ICD-10-CM | POA: Diagnosis not present

## 2018-05-06 DIAGNOSIS — R1310 Dysphagia, unspecified: Secondary | ICD-10-CM | POA: Diagnosis not present

## 2018-05-06 DIAGNOSIS — C7951 Secondary malignant neoplasm of bone: Secondary | ICD-10-CM | POA: Diagnosis not present

## 2018-05-06 DIAGNOSIS — G9340 Encephalopathy, unspecified: Secondary | ICD-10-CM | POA: Diagnosis not present

## 2018-05-06 DIAGNOSIS — J449 Chronic obstructive pulmonary disease, unspecified: Secondary | ICD-10-CM | POA: Diagnosis not present

## 2018-05-06 DIAGNOSIS — I4891 Unspecified atrial fibrillation: Secondary | ICD-10-CM | POA: Diagnosis not present

## 2018-05-11 DIAGNOSIS — G9340 Encephalopathy, unspecified: Secondary | ICD-10-CM | POA: Diagnosis not present

## 2018-05-11 DIAGNOSIS — I4891 Unspecified atrial fibrillation: Secondary | ICD-10-CM | POA: Diagnosis not present

## 2018-05-11 DIAGNOSIS — R1310 Dysphagia, unspecified: Secondary | ICD-10-CM | POA: Diagnosis not present

## 2018-05-11 DIAGNOSIS — C50919 Malignant neoplasm of unspecified site of unspecified female breast: Secondary | ICD-10-CM | POA: Diagnosis not present

## 2018-05-11 DIAGNOSIS — C7951 Secondary malignant neoplasm of bone: Secondary | ICD-10-CM | POA: Diagnosis not present

## 2018-05-11 DIAGNOSIS — J449 Chronic obstructive pulmonary disease, unspecified: Secondary | ICD-10-CM | POA: Diagnosis not present

## 2018-05-13 DIAGNOSIS — C7951 Secondary malignant neoplasm of bone: Secondary | ICD-10-CM | POA: Diagnosis not present

## 2018-05-13 DIAGNOSIS — G9340 Encephalopathy, unspecified: Secondary | ICD-10-CM | POA: Diagnosis not present

## 2018-05-13 DIAGNOSIS — I4891 Unspecified atrial fibrillation: Secondary | ICD-10-CM | POA: Diagnosis not present

## 2018-05-13 DIAGNOSIS — R1310 Dysphagia, unspecified: Secondary | ICD-10-CM | POA: Diagnosis not present

## 2018-05-13 DIAGNOSIS — J449 Chronic obstructive pulmonary disease, unspecified: Secondary | ICD-10-CM | POA: Diagnosis not present

## 2018-05-13 DIAGNOSIS — C50919 Malignant neoplasm of unspecified site of unspecified female breast: Secondary | ICD-10-CM | POA: Diagnosis not present

## 2018-05-18 DIAGNOSIS — J449 Chronic obstructive pulmonary disease, unspecified: Secondary | ICD-10-CM | POA: Diagnosis not present

## 2018-05-18 DIAGNOSIS — C7951 Secondary malignant neoplasm of bone: Secondary | ICD-10-CM | POA: Diagnosis not present

## 2018-05-18 DIAGNOSIS — G9340 Encephalopathy, unspecified: Secondary | ICD-10-CM | POA: Diagnosis not present

## 2018-05-18 DIAGNOSIS — C50919 Malignant neoplasm of unspecified site of unspecified female breast: Secondary | ICD-10-CM | POA: Diagnosis not present

## 2018-05-18 DIAGNOSIS — I4891 Unspecified atrial fibrillation: Secondary | ICD-10-CM | POA: Diagnosis not present

## 2018-05-18 DIAGNOSIS — R1310 Dysphagia, unspecified: Secondary | ICD-10-CM | POA: Diagnosis not present

## 2018-05-19 DIAGNOSIS — C50919 Malignant neoplasm of unspecified site of unspecified female breast: Secondary | ICD-10-CM | POA: Diagnosis not present

## 2018-05-19 DIAGNOSIS — D630 Anemia in neoplastic disease: Secondary | ICD-10-CM | POA: Diagnosis not present

## 2018-05-19 DIAGNOSIS — K589 Irritable bowel syndrome without diarrhea: Secondary | ICD-10-CM | POA: Diagnosis not present

## 2018-05-19 DIAGNOSIS — J301 Allergic rhinitis due to pollen: Secondary | ICD-10-CM | POA: Diagnosis not present

## 2018-05-19 DIAGNOSIS — R1310 Dysphagia, unspecified: Secondary | ICD-10-CM | POA: Diagnosis not present

## 2018-05-19 DIAGNOSIS — K219 Gastro-esophageal reflux disease without esophagitis: Secondary | ICD-10-CM | POA: Diagnosis not present

## 2018-05-19 DIAGNOSIS — J449 Chronic obstructive pulmonary disease, unspecified: Secondary | ICD-10-CM | POA: Diagnosis not present

## 2018-05-19 DIAGNOSIS — C7951 Secondary malignant neoplasm of bone: Secondary | ICD-10-CM | POA: Diagnosis not present

## 2018-05-19 DIAGNOSIS — N183 Chronic kidney disease, stage 3 (moderate): Secondary | ICD-10-CM | POA: Diagnosis not present

## 2018-05-19 DIAGNOSIS — I4891 Unspecified atrial fibrillation: Secondary | ICD-10-CM | POA: Diagnosis not present

## 2018-05-19 DIAGNOSIS — G9340 Encephalopathy, unspecified: Secondary | ICD-10-CM | POA: Diagnosis not present

## 2018-05-20 DIAGNOSIS — C50919 Malignant neoplasm of unspecified site of unspecified female breast: Secondary | ICD-10-CM | POA: Diagnosis not present

## 2018-05-20 DIAGNOSIS — R1310 Dysphagia, unspecified: Secondary | ICD-10-CM | POA: Diagnosis not present

## 2018-05-20 DIAGNOSIS — C7951 Secondary malignant neoplasm of bone: Secondary | ICD-10-CM | POA: Diagnosis not present

## 2018-05-20 DIAGNOSIS — J449 Chronic obstructive pulmonary disease, unspecified: Secondary | ICD-10-CM | POA: Diagnosis not present

## 2018-05-20 DIAGNOSIS — G9340 Encephalopathy, unspecified: Secondary | ICD-10-CM | POA: Diagnosis not present

## 2018-05-20 DIAGNOSIS — I4891 Unspecified atrial fibrillation: Secondary | ICD-10-CM | POA: Diagnosis not present

## 2018-05-23 DIAGNOSIS — R1310 Dysphagia, unspecified: Secondary | ICD-10-CM | POA: Diagnosis not present

## 2018-05-23 DIAGNOSIS — C7951 Secondary malignant neoplasm of bone: Secondary | ICD-10-CM | POA: Diagnosis not present

## 2018-05-23 DIAGNOSIS — C50919 Malignant neoplasm of unspecified site of unspecified female breast: Secondary | ICD-10-CM | POA: Diagnosis not present

## 2018-05-23 DIAGNOSIS — G9340 Encephalopathy, unspecified: Secondary | ICD-10-CM | POA: Diagnosis not present

## 2018-05-23 DIAGNOSIS — I4891 Unspecified atrial fibrillation: Secondary | ICD-10-CM | POA: Diagnosis not present

## 2018-05-23 DIAGNOSIS — J449 Chronic obstructive pulmonary disease, unspecified: Secondary | ICD-10-CM | POA: Diagnosis not present

## 2018-05-27 ENCOUNTER — Ambulatory Visit: Payer: Medicare Other

## 2018-05-27 ENCOUNTER — Other Ambulatory Visit: Payer: Medicare Other

## 2018-05-30 DIAGNOSIS — J449 Chronic obstructive pulmonary disease, unspecified: Secondary | ICD-10-CM | POA: Diagnosis not present

## 2018-05-30 DIAGNOSIS — G9340 Encephalopathy, unspecified: Secondary | ICD-10-CM | POA: Diagnosis not present

## 2018-05-30 DIAGNOSIS — C7951 Secondary malignant neoplasm of bone: Secondary | ICD-10-CM | POA: Diagnosis not present

## 2018-05-30 DIAGNOSIS — I4891 Unspecified atrial fibrillation: Secondary | ICD-10-CM | POA: Diagnosis not present

## 2018-05-30 DIAGNOSIS — R1310 Dysphagia, unspecified: Secondary | ICD-10-CM | POA: Diagnosis not present

## 2018-05-30 DIAGNOSIS — C50919 Malignant neoplasm of unspecified site of unspecified female breast: Secondary | ICD-10-CM | POA: Diagnosis not present

## 2018-05-31 ENCOUNTER — Non-Acute Institutional Stay: Payer: Medicare Other | Admitting: Nurse Practitioner

## 2018-05-31 ENCOUNTER — Encounter: Payer: Self-pay | Admitting: Nurse Practitioner

## 2018-05-31 DIAGNOSIS — I10 Essential (primary) hypertension: Secondary | ICD-10-CM | POA: Diagnosis not present

## 2018-05-31 DIAGNOSIS — R627 Adult failure to thrive: Secondary | ICD-10-CM

## 2018-05-31 DIAGNOSIS — M159 Polyosteoarthritis, unspecified: Secondary | ICD-10-CM | POA: Diagnosis not present

## 2018-05-31 DIAGNOSIS — M316 Other giant cell arteritis: Secondary | ICD-10-CM | POA: Diagnosis not present

## 2018-05-31 DIAGNOSIS — K5901 Slow transit constipation: Secondary | ICD-10-CM | POA: Diagnosis not present

## 2018-05-31 DIAGNOSIS — F419 Anxiety disorder, unspecified: Secondary | ICD-10-CM | POA: Diagnosis not present

## 2018-05-31 DIAGNOSIS — F329 Major depressive disorder, single episode, unspecified: Secondary | ICD-10-CM

## 2018-05-31 DIAGNOSIS — R413 Other amnesia: Secondary | ICD-10-CM | POA: Diagnosis not present

## 2018-05-31 NOTE — Assessment & Plan Note (Signed)
Her pain is managed, continue Tramadol 50mg  tid, prn Oxycodone 5mg  q6h.

## 2018-05-31 NOTE — Assessment & Plan Note (Signed)
Her blood pressure is controlled, continue  Metoprolol 50mg  qd.

## 2018-05-31 NOTE — Assessment & Plan Note (Signed)
Giant cell arteritis is also managed, continue Prednisone 5mg  qd.

## 2018-05-31 NOTE — Progress Notes (Signed)
Location:  [facility] Room Number: 703 Place of Service:  ALF [phone]) Provider: Shaun Runyon, ManXie  NP  Blanchie Serve, MD  Patient Care Team: Blanchie Serve, MD as PCP - General (Internal Medicine) Fanny Skates, MD as Consulting Physician (General Surgery) [name], [name], MD as Consulting Physician (Oncology) [name], MD as Consulting Physician (Radiation Oncology) Rockwell Germany, RN as Registered Nurse Mauro Kaufmann, RN as Registered Nurse Gentry Fitz, MD as Consulting Physician (Family Medicine) Zeva Leber X, NP as Nurse Practitioner (Internal Medicine) Thompson Grayer, MD as Consulting Physician (Cardiology)  Extended Emergency Contact Information Primary Emergency Contact: Matthews,[name] Address: [address]          [place], [place] [address] [place] Mobile Phone: [phone] Relation: Daughter Secondary Emergency Contact: Clent Demark [place] Phone: [phone] Relation: Other  Code Status: DNR Goals of care: Advanced Directive information Advanced Directives 05/31/2018  Does Patient Have a Medical Advance Directive? Yes  Type of Paramedic of [place];Out of facility DNR (pink MOST or yellow form)  Does patient want to make changes to medical advance directive? No - Patient declined  Copy of [place] in Chart? Yes  Would patient like information on creating a medical advance directive? -  Pre-existing out of facility DNR order (yellow form or pink MOST form) Yellow form placed in chart (order not valid for inpatient use);Pink MOST form placed in chart (order not valid for inpatient use)     Chief Complaint  Patient presents with  . Medical Management of Chronic Issues    F/u - (R) hand/wrist,     HPI:  Pt is a 84 y.o. female seen today for medical management of chronic diseases.     The patient is under Hospice service for adult FTT,  wobbly, ambulates with walker, resides in [facility] for care assistance. She has memory lapse, appears anxious, on Mirtazapine 7.5mg  qd, prn Lorazepam q6h available but she doesn't always make her needs known. Her pain is managed with Tramadol 50mg  tid, prn Oxycodone 5mg  q6h. Giant cell arteritis is also managed with Prednisone 5mg  qd. Her blood pressure is controlled on Metoprolol 50mg  qd. She is taking Senokot S II qhs, she had loose stool during today's visit. She denied nausea, vomiting, abd pain.   Past Medical History:  Diagnosis Date  . Asthma    "as a child"  . Breast cancer ([place]) 12/08/2014   Bilateral  . Cancer of central portion of female breast ([place]) 12/04/2014  . Cancer of central portion of female breast ([place]) 12/04/2014  . Complete heart block ([place]) 1999   s/p PPM by Dr Olevia Perches, most recent generator change 2009  . Complication of anesthesia   . COPD (chronic obstructive pulmonary disease) ([place])   . Dementia   . Diverticulosis   . DJD (degenerative joint disease)    RA  . Esophageal stricture   . Family history of adverse reaction to anesthesia    Daughter N/V  . GERD (gastroesophageal reflux disease)   . Giant cell arteritis ([place])   . Goiter   . Hemorrhoids   . Hiatal hernia   . IBS (irritable bowel syndrome)   . Paroxysmal atrial fibrillation ([place]) 12/17/2015   asymptomatic, <1%, determined on regular pacemaker interrogation  . Pneumonia   . PONV (postoperative nausea and vomiting)   . S/P radiation therapy 04/24/15 completed   T-11  . Wears glasses  Past Surgical History:  Procedure Laterality Date  . ANAL FISSURE REPAIR    . ARTERY BIOPSY Left 01/03/2013   Procedure: BIOPSY LEFT TEMPORAL ARTERY;  Surgeon: Ascencion Dike, MD;  Location: [facility];  Service: ENT;  Laterality: Left;  . BREAST BIOPSY Right   . CHOLECYSTECTOMY    . COLONOSCOPY    . FRACTURE SURGERY     fx lt wrist  . HEMORRHOID SURGERY    . INSERT / REPLACE / [place]    Gen change (SJM) by Dr Olevia Perches 2009  . IR FLUORO GUIDE PORT INSERTION RIGHT  12/22/2017  . IR US GUIDE VASC ACCESS RIGHT  12/22/2017  . LUNG REMOVAL, PARTIAL  1950   left lower lobectomy for brochiectasis   . MASTECTOMY Left 02/08/2018  . MOUTH SURGERY     patient unaware  . RECTAL POLYPECTOMY    . RENAL CYST EXCISION    . TONSILLECTOMY    . TOTAL MASTECTOMY Left 02/08/2018  . TOTAL MASTECTOMY Left 02/08/2018   Procedure: TOTAL MASTECTOMY LEFT;  Surgeon: Fanny Skates, MD;  Location: [facility];  Service: General;  Laterality: Left;    Allergies  Allergen Reactions  . Amoxicillin-Pot Clavulanate Other (See Comments)    Upset stomach Has patient had a PCN reaction causing immediate rash, facial/tongue/throat swelling, SOB or lightheadedness with hypotension: No Has patient had a PCN reaction causing severe rash involving mucus membranes or skin necrosis: No Has patient had a PCN reaction that required hospitalization: No Has patient had a PCN reaction occurring within the last 10 years: No If all of the above answers are "NO", then may proceed with Cephalosporin use.   Marland Kitchen Morphine And Related Nausea Only    Outpatient Encounter Medications as of 05/31/2018  Medication Sig  . feeding supplement (BOOST / RESOURCE BREEZE) LIQD Take 1 Container by mouth 3 (three) times daily with meals.   Marland Kitchen loperamide (IMODIUM A-D) 2 MG tablet Take 2 mg by mouth as needed for diarrhea or loose stools.  Marland Kitchen LORazepam (ATIVAN) 0.5 MG tablet Take 0.5 mg by mouth every 6 (six) hours as needed for anxiety.  . magnesium hydroxide (MILK OF MAGNESIA) 400 MG/5ML suspension Take 30 mLs by mouth daily as needed for mild constipation.  . metoprolol succinate (TOPROL-XL) 50 MG 24 hr tablet Take 50 mg by mouth daily. Take with or immediately following a meal.  . mirtazapine (REMERON) 15 MG tablet Take 7.5 mg by mouth at bedtime.  . ondansetron (ZOFRAN) 4 MG tablet Take 4 mg by mouth 3 (three) times daily.   Marland Kitchen oxycodone  (OXY-IR) 5 MG capsule Take 5 mg by mouth every 6 (six) hours as needed.  . predniSONE (DELTASONE) 5 MG tablet Take 5 mg by mouth daily with breakfast.  . traMADol (ULTRAM) 50 MG tablet Take 50 mg by mouth 3 (three) times daily.   . [DISCONTINUED] acetaminophen (TYLENOL) 325 MG tablet Take 650 mg by mouth every 4 (four) hours as needed.  . [DISCONTINUED] sennosides-docusate sodium (SENOKOT-S) 8.6-50 MG tablet Take 1 tablet by mouth at bedtime.    No facility-administered encounter medications on file as of 05/31/2018.     ROS was provided with assistance of staff Review of Systems  Constitutional: Positive for fatigue. Negative for activity change, appetite change, chills, diaphoresis and fever.  HENT: Positive for hearing loss. Negative for congestion and voice change.   Respiratory: Positive for shortness of breath. Negative for cough and wheezing.   Cardiovascular: Negative  for chest pain and leg swelling.  Gastrointestinal: Negative for abdominal distention, abdominal pain, constipation, diarrhea, nausea and vomiting.       Noted loose stool.   Genitourinary: Negative for difficulty urinating, dysuria and urgency.  Musculoskeletal: Positive for gait problem.  Skin: Negative for color change and pallor.       Skin tear left elbow, right wrist. Anterior left shin bruise.   Neurological: Negative for dizziness, speech difficulty, weakness and headaches.       Memory lapses.   Psychiatric/Behavioral: Positive for confusion. Negative for agitation, behavioral problems and sleep disturbance. The patient is nervous/anxious.     Immunization History  Administered Date(s) Administered  . Influenza Split 07/24/2011, 06/20/2012  . Influenza Whole 10/19/2001, 07/25/2007, 08/02/2008, 07/18/2009, 07/10/2010  . Influenza,inj,Quad PF,6+ Mos 07/18/2013, 08/27/2014  . Influenza-Unspecified 07/31/2015, 08/19/2016, 08/09/2017  . Pneumococcal Conjugate-13 08/16/2017  . Pneumococcal Polysaccharide-23  10/19/1997, 01/17/2010  . Td 10/19/1996, 07/25/2007   Pertinent  Health Maintenance Due  Topic Date Due  . INFLUENZA VACCINE  05/19/2018  . DEXA SCAN  Completed  . PNA vac Low Risk Adult  Completed   Fall Risk  07/01/2017 02/22/2017 05/22/2015 03/22/2015 08/02/2014  Falls in the past year? No No No No No   Functional Status Survey:    Vitals:   05/31/18 1305  BP: 120/60  Pulse: 78  Resp: 18  Temp: 97.7 F (36.5 C)  SpO2: 94%  Weight: 106 lb (48.1 kg)  Height: 5\' 4"  (1.626 m)   Body mass index is 18.19 kg/m. Physical Exam  Constitutional: She appears well-developed and well-nourished.  HENT:  Head: Normocephalic and atraumatic.  Eyes: Pupils are equal, round, and reactive to light. EOM are normal.  Neck: Normal range of motion. No JVD present. No thyromegaly present.  Cardiovascular: Normal rate.  Murmur heard. Irregular heart beats. Pacemaker.   Pulmonary/Chest: Effort normal. She has no wheezes. She has rales.  Fine rales posterior right lung base.   Abdominal: Soft. Bowel sounds are normal. She exhibits no distension. There is no rebound and no guarding.  Musculoskeletal:  Self transfer, ambulates with walker.   Neurological: She is alert. No cranial nerve deficit. She exhibits normal muscle tone. Coordination normal.  Oriented to person and her room on unit.   Skin: Skin is warm and dry.  Skin tear left elbow, right wrist, ecchymosis left shin.   Psychiatric: Her behavior is normal.  Appears anxious.     Labs reviewed: Recent Labs    03/30/18 1327 03/31/18 0406 04/01/18 0349 04/26/18 05/02/18  NA 128* 134* 135 132* 136*  K 3.0* 4.9 3.8 4.0 4.6  CL 93* 102 107 95 99  CO2 26 20* 23 30 32  GLUCOSE 107* 153* 102*  --   --   BUN 5* 6 9 15 10   CREATININE 0.64 0.76 0.64 0.8 0.8  CALCIUM 8.4* 8.5* 7.7* 8.0 8.9  MG  --  1.8  --   --   --    Recent Labs    02/17/18 03/04/18 1034 03/30/18 03/30/18 1327 04/26/18  AST 37 37* 38* 46* 28  ALT 32 35 27 34 20    ALKPHOS 71 92 76 69 70  BILITOT 0.7 0.6  --  0.7  --   PROT  --  6.6  --  6.1* 5.1  ALBUMIN 3.7 3.3* 3.2 3.0* 3.0   Recent Labs    02/03/18 1344  03/04/18 1034  03/30/18 1327 03/31/18 0406 04/01/18 0349 04/26/18  WBC 7.1   < >  5.8   < > 8.8 8.2 9.5 10.0  NEUTROABS 6.0  --  4.0  --  6.1  --   --   --   HGB 12.6   < > 12.1   < > 12.3 12.5 10.8* 11.4*  HCT 39.5   < > 37.1   < > 36.4 37.2 32.6* 34*  MCV 89.4   < > 85.9  --  81.8 81.6 83.0  --   PLT 225   < > 198   < > 188 247 223 284   < > = values in this interval not displayed.   Lab Results  Component Value Date   TSH 0.719 03/31/2018   Lab Results  Component Value Date   HGBA1C 6.1 (H) 02/09/2018   Lab Results  Component Value Date   CHOL 194 07/25/2007   HDL 62.7 07/25/2007   LDLCALC 118 (H) 07/25/2007   TRIG 69 07/25/2007   CHOLHDL 3.1 CALC 07/25/2007    Significant Diagnostic Results in last 30 days:  No results found.  Assessment/Plan Adult failure to thrive Wobbly gait, ambulates with walker, resides in [facility] for care assistance.   Memory loss She has memory lapse, appears anxious, on Mirtazapine 7.5mg  qd, prn Lorazepam q6h available but she doesn't always make her needs known. Will schedule Lorazepam 0.5mg  at 5pm, observe.   Anxiety and depression Continue Mirtazapine 7.5mg  qhs, Lorazepam 0.5mg  q6h prn, and schedule Lorazepam 0.5mg  at 5 pm, observe.   Generalized osteoarthritis of multiple sites Her pain is managed, continue Tramadol 50mg  tid, prn Oxycodone 5mg  q6h.  Giant cell arteritis Giant cell arteritis is also managed, continue Prednisone 5mg  qd.  Essential hypertension Her blood pressure is controlled, continue  Metoprolol 50mg  qd.  Slow transit constipation She is taking Senokot S II qhs, she had loose stool during today's visit, reduce Senokot S to I qhs     Family/ staff Communication: plan of care reviewed with the patient and charge nurse.   Labs/tests ordered:  none  Time  spend 25 minutes.

## 2018-05-31 NOTE — Assessment & Plan Note (Signed)
She has memory lapse, appears anxious, on Mirtazapine 7.5mg  qd, prn Lorazepam q6h available but she doesn't always make her needs known. Will schedule Lorazepam 0.5mg  at 5pm, observe.

## 2018-05-31 NOTE — Assessment & Plan Note (Signed)
Continue Mirtazapine 7.5mg  qhs, Lorazepam 0.5mg  q6h prn, and schedule Lorazepam 0.5mg  at 5 pm, observe.

## 2018-05-31 NOTE — Assessment & Plan Note (Signed)
She is taking Senokot S II qhs, she had loose stool during today's visit, reduce Senokot S to I qhs

## 2018-05-31 NOTE — Assessment & Plan Note (Signed)
Wobbly gait, ambulates with walker, resides in [place] [facility] for care assistance.

## 2018-06-01 ENCOUNTER — Encounter: Payer: Self-pay | Admitting: Nurse Practitioner

## 2018-06-01 ENCOUNTER — Encounter: Payer: Self-pay | Admitting: Internal Medicine

## 2018-06-02 DIAGNOSIS — C7951 Secondary malignant neoplasm of bone: Secondary | ICD-10-CM | POA: Diagnosis not present

## 2018-06-02 DIAGNOSIS — G9340 Encephalopathy, unspecified: Secondary | ICD-10-CM | POA: Diagnosis not present

## 2018-06-02 DIAGNOSIS — R1310 Dysphagia, unspecified: Secondary | ICD-10-CM | POA: Diagnosis not present

## 2018-06-02 DIAGNOSIS — I4891 Unspecified atrial fibrillation: Secondary | ICD-10-CM | POA: Diagnosis not present

## 2018-06-02 DIAGNOSIS — J449 Chronic obstructive pulmonary disease, unspecified: Secondary | ICD-10-CM | POA: Diagnosis not present

## 2018-06-02 DIAGNOSIS — C50919 Malignant neoplasm of unspecified site of unspecified female breast: Secondary | ICD-10-CM | POA: Diagnosis not present

## 2018-06-07 ENCOUNTER — Non-Acute Institutional Stay: Payer: Medicare Other | Admitting: Nurse Practitioner

## 2018-06-07 ENCOUNTER — Encounter: Payer: Self-pay | Admitting: Nurse Practitioner

## 2018-06-07 DIAGNOSIS — M159 Polyosteoarthritis, unspecified: Secondary | ICD-10-CM

## 2018-06-07 DIAGNOSIS — R413 Other amnesia: Secondary | ICD-10-CM | POA: Diagnosis not present

## 2018-06-07 DIAGNOSIS — F419 Anxiety disorder, unspecified: Secondary | ICD-10-CM | POA: Diagnosis not present

## 2018-06-07 DIAGNOSIS — F329 Major depressive disorder, single episode, unspecified: Secondary | ICD-10-CM

## 2018-06-07 DIAGNOSIS — R443 Hallucinations, unspecified: Secondary | ICD-10-CM | POA: Insufficient documentation

## 2018-06-07 NOTE — Assessment & Plan Note (Signed)
No pain upon my examination, will have prn Tramadol, Oxycodone available to her. Observe.

## 2018-06-07 NOTE — Progress Notes (Signed)
Location:  [facility] Room Number: 361 Place of Service:  ALF [phone]) Provider:  Ryaan Vanwagoner, ManXie  NP  Blanchie Serve, MD  Patient Care Team: Blanchie Serve, MD as PCP - General (Internal Medicine) Fanny Skates, MD as Consulting Physician (General Surgery) [name], [name], MD as Consulting Physician (Oncology) [name], MD as Consulting Physician (Radiation Oncology) Rockwell Germany, RN as Registered Nurse Mauro Kaufmann, RN as Registered Nurse Gentry Fitz, MD as Consulting Physician (Family Medicine) Jaia Alonge X, NP as Nurse Practitioner (Internal Medicine) Thompson Grayer, MD as Consulting Physician (Cardiology)  Extended Emergency Contact Information Primary Emergency Contact: Matthews,[name] Address: [address]          [place], [place] [address] [place] Mobile Phone: [phone] Relation: Daughter Secondary Emergency Contact: Clent Demark [place] Phone: [phone] Relation: Other  Code Status:  DNR Goals of care: Advanced Directive information Advanced Directives 05/31/2018  Does Patient Have a Medical Advance Directive? Yes  Type of Paramedic of [place];Out of facility DNR (pink MOST or yellow form)  Does patient want to make changes to medical advance directive? No - Patient declined  Copy of [place] in Chart? Yes  Would patient like information on creating a medical advance directive? -  Pre-existing out of facility DNR order (yellow form or pink MOST form) Yellow form placed in chart (order not valid for inpatient use);Pink MOST form placed in chart (order not valid for inpatient use)     Chief Complaint  Patient presents with  . Acute Visit    Hallucinations    HPI:  Pt is a 84 y.o. female seen today for an acute visit for 06/07/18 reported the patient has hallucination behaviors with increased confusion: has a soft BM on self, filled  trash can with water, walking down hallways naked. The patient appears anxious and confused upon my visit. The patient denied pain upon my visit. She takes Tramadol for pain, prn Oxycodone available to her.    Past Medical History:  Diagnosis Date  . Asthma    "as a child"  . Breast cancer ([place]) 12/08/2014   Bilateral  . Cancer of central portion of female breast ([place]) 12/04/2014  . Cancer of central portion of female breast ([place]) 12/04/2014  . Complete heart block ([place]) 1999   s/p PPM by Dr Olevia Perches, most recent generator change 2009  . Complication of anesthesia   . COPD (chronic obstructive pulmonary disease) ([place])   . Dementia   . Diverticulosis   . DJD (degenerative joint disease)    RA  . Esophageal stricture   . Family history of adverse reaction to anesthesia    Daughter N/V  . GERD (gastroesophageal reflux disease)   . Giant cell arteritis ([place])   . Goiter   . Hemorrhoids   . Hiatal hernia   . IBS (irritable bowel syndrome)   . Paroxysmal atrial fibrillation ([place]) 12/17/2015   asymptomatic, <1%, determined on regular pacemaker interrogation  . Pneumonia   . PONV (postoperative nausea and vomiting)   . S/P radiation therapy 04/24/15 completed   T-11  . Wears glasses    Past Surgical History:  Procedure Laterality Date  . ANAL FISSURE REPAIR    . ARTERY BIOPSY Left 01/03/2013   Procedure: BIOPSY LEFT TEMPORAL ARTERY;  Surgeon: Ascencion Dike, MD;  Location: [facility];  Service: ENT;  Laterality: Left;  . BREAST BIOPSY Right   .  CHOLECYSTECTOMY    . COLONOSCOPY    . FRACTURE SURGERY     fx lt wrist  . HEMORRHOID SURGERY    . INSERT / REPLACE / [place]   Gen change (SJM) by Dr Olevia Perches 2009  . IR FLUORO GUIDE PORT INSERTION RIGHT  12/22/2017  . IR US GUIDE VASC ACCESS RIGHT  12/22/2017  . LUNG REMOVAL, PARTIAL  1950   left lower lobectomy for brochiectasis   . MASTECTOMY Left 02/08/2018  . MOUTH SURGERY     patient unaware  . RECTAL  POLYPECTOMY    . RENAL CYST EXCISION    . TONSILLECTOMY    . TOTAL MASTECTOMY Left 02/08/2018  . TOTAL MASTECTOMY Left 02/08/2018   Procedure: TOTAL MASTECTOMY LEFT;  Surgeon: Fanny Skates, MD;  Location: [facility];  Service: General;  Laterality: Left;    Allergies  Allergen Reactions  . Amoxicillin-Pot Clavulanate Other (See Comments)    Upset stomach Has patient had a PCN reaction causing immediate rash, facial/tongue/throat swelling, SOB or lightheadedness with hypotension: No Has patient had a PCN reaction causing severe rash involving mucus membranes or skin necrosis: No Has patient had a PCN reaction that required hospitalization: No Has patient had a PCN reaction occurring within the last 10 years: No If all of the above answers are "NO", then may proceed with Cephalosporin use.   Marland Kitchen Morphine And Related Nausea Only    Outpatient Encounter Medications as of 06/07/2018  Medication Sig  . feeding supplement (BOOST / RESOURCE BREEZE) LIQD Take 1 Container by mouth 3 (three) times daily with meals.   Marland Kitchen LORazepam (ATIVAN) 0.5 MG tablet Take 0.5 mg by mouth every 6 (six) hours as needed for anxiety.  . magnesium hydroxide (MILK OF MAGNESIA) 400 MG/5ML suspension Take 30 mLs by mouth daily as needed for mild constipation.  . metoprolol succinate (TOPROL-XL) 50 MG 24 hr tablet Take 50 mg by mouth daily. Take with or immediately following a meal.  . mirtazapine (REMERON) 15 MG tablet Take 7.5 mg by mouth at bedtime.  . ondansetron (ZOFRAN) 4 MG tablet Take 4 mg by mouth 3 (three) times daily.   Marland Kitchen oxycodone (OXY-IR) 5 MG capsule Take 5 mg by mouth every 6 (six) hours as needed.  . predniSONE (DELTASONE) 5 MG tablet Take 5 mg by mouth daily with breakfast.  . traMADol (ULTRAM) 50 MG tablet Take 50 mg by mouth 3 (three) times daily.   . [DISCONTINUED] loperamide (IMODIUM A-D) 2 MG tablet Take 2 mg by mouth as needed for diarrhea or loose stools.   No facility-administered encounter  medications on file as of 06/07/2018.    ROS was provided with assistance of staff Review of Systems  Constitutional: Positive for fatigue. Negative for activity change, appetite change, chills, diaphoresis and fever.  HENT: Positive for hearing loss. Negative for congestion and voice change.   Respiratory: Positive for cough and shortness of breath. Negative for wheezing.   Cardiovascular: Negative for chest pain, palpitations and leg swelling.  Gastrointestinal: Negative for abdominal distention, abdominal pain, constipation, diarrhea, nausea and vomiting.  Genitourinary: Negative for difficulty urinating, dysuria and urgency.  Musculoskeletal: Positive for gait problem. Negative for arthralgias.  Skin: Negative for color change and pallor.  Neurological: Negative for dizziness, speech difficulty and weakness.       Memory lapses.   Psychiatric/Behavioral: Positive for agitation, behavioral problems, confusion and hallucinations. Negative for sleep disturbance. The patient is nervous/anxious.     Immunization History  Administered Date(s) Administered  . Influenza Split 07/24/2011, 06/20/2012  . Influenza Whole 10/19/2001, 07/25/2007, 08/02/2008, 07/18/2009, 07/10/2010  . Influenza,inj,Quad PF,6+ Mos 07/18/2013, 08/27/2014  . Influenza-Unspecified 07/31/2015, 08/19/2016, 08/09/2017  . Pneumococcal Conjugate-13 08/16/2017  . Pneumococcal Polysaccharide-23 10/19/1997, 01/17/2010  . Td 10/19/1996, 07/25/2007   Pertinent  Health Maintenance Due  Topic Date Due  . INFLUENZA VACCINE  05/19/2018  . DEXA SCAN  Completed  . PNA vac Low Risk Adult  Completed   Fall Risk  07/01/2017 02/22/2017 05/22/2015 03/22/2015 08/02/2014  Falls in the past year? No No No No No   Functional Status Survey:    Vitals:   06/07/18 1026  BP: 124/70  Pulse: 70  Resp: 20  Temp: 98.2 F (36.8 C)  SpO2: 94%  Weight: 106 lb (48.1 kg)  Height: 5' (1.524 m)   Body mass index is 20.7 kg/m. Physical Exam    Constitutional: She appears well-developed and well-nourished.  HENT:  Head: Normocephalic and atraumatic.  Eyes: Pupils are equal, round, and reactive to light. EOM are normal.  Neck: Normal range of motion. Neck supple. No JVD present. No thyromegaly present.  Cardiovascular: Normal rate.  Murmur heard. Irregular heartbeats, pacemaker.   Pulmonary/Chest: Effort normal. She has no wheezes. She has no rales.  Abdominal: Soft. Bowel sounds are normal. She exhibits no distension. There is no tenderness. There is guarding.  Musculoskeletal: She exhibits no edema.  Self transfer, ambulating.   Neurological: She is alert. No cranial nerve deficit. She exhibits normal muscle tone. Coordination normal.  Oriented to person  Skin: Skin is warm and dry.  Psychiatric:  Appears anxious    Labs reviewed: Recent Labs    03/30/18 1327 03/31/18 0406 04/01/18 0349 04/26/18 05/02/18  NA 128* 134* 135 132* 136*  K 3.0* 4.9 3.8 4.0 4.6  CL 93* 102 107 95 99  CO2 26 20* 23 30 32  GLUCOSE 107* 153* 102*  --   --   BUN 5* 6 9 15 10   CREATININE 0.64 0.76 0.64 0.8 0.8  CALCIUM 8.4* 8.5* 7.7* 8.0 8.9  MG  --  1.8  --   --   --    Recent Labs    02/17/18 03/04/18 1034 03/30/18 03/30/18 1327 04/26/18  AST 37 37* 38* 46* 28  ALT 32 35 27 34 20  ALKPHOS 71 92 76 69 70  BILITOT 0.7 0.6  --  0.7  --   PROT  --  6.6  --  6.1* 5.1  ALBUMIN 3.7 3.3* 3.2 3.0* 3.0   Recent Labs    02/03/18 1344  03/04/18 1034  03/30/18 1327 03/31/18 0406 04/01/18 0349 04/26/18  WBC 7.1   < > 5.8   < > 8.8 8.2 9.5 10.0  NEUTROABS 6.0  --  4.0  --  6.1  --   --   --   HGB 12.6   < > 12.1   < > 12.3 12.5 10.8* 11.4*  HCT 39.5   < > 37.1   < > 36.4 37.2 32.6* 34*  MCV 89.4   < > 85.9  --  81.8 81.6 83.0  --   PLT 225   < > 198   < > 188 247 223 284   < > = values in this interval not displayed.   Lab Results  Component Value Date   TSH 0.719 03/31/2018   Lab Results  Component Value Date   HGBA1C 6.1 (H)  02/09/2018   Lab Results  Component Value Date   CHOL 194 07/25/2007   HDL 62.7 07/25/2007   LDLCALC 118 (H) 07/25/2007   TRIG 69 07/25/2007   CHOLHDL 3.1 CALC 07/25/2007    Significant Diagnostic Results in last 30 days:  No results found.  Assessment/Plan Hallucination 06/07/18 reported hallucination behaviors with increased confusion: has a soft BM on self, filled trash can with water, walking down hallways naked. The patient appears anxious and confused upon my visit. Will eliminate possible AR of Tramadol-change it to 50mg  q6h prn. The patient denied pain upon my visit. Monitor for hallucination/behaviors. Update CBC/diff CMP. Will try Seroquel 25mg  po qhs  Memory loss Apparently the patient is more confused, no focal neurological symptoms, Tramadol may be contributory, change it to prn. Observe.   Anxiety and depression The patient is anxious, continue Mirtazapine, Lorazepam, adding Seroquel for better mood control. Observe.   Generalized osteoarthritis of multiple sites No pain upon my examination, will have prn Tramadol, Oxycodone available to her. Observe.      Family/ staff Communication: plan of care reviewed with the patient and charge nurse.   Labs/tests ordered:  CBC/diff, CMP  Time spend 25 minutes.

## 2018-06-07 NOTE — Assessment & Plan Note (Signed)
Apparently the patient is more confused, no focal neurological symptoms, Tramadol may be contributory, change it to prn. Observe.

## 2018-06-07 NOTE — Assessment & Plan Note (Signed)
The patient is anxious, continue Mirtazapine, Lorazepam, adding Seroquel for better mood control. Observe.

## 2018-06-07 NOTE — Assessment & Plan Note (Signed)
06/07/18 reported hallucination behaviors with increased confusion: has a soft BM on self, filled trash can with water, walking down hallways naked. The patient appears anxious and confused upon my visit. Will eliminate possible AR of Tramadol-change it to 50mg  q6h prn. The patient denied pain upon my visit. Monitor for hallucination/behaviors. Update CBC/diff CMP. Will try Seroquel 25mg  po qhs

## 2018-06-08 DIAGNOSIS — C7951 Secondary malignant neoplasm of bone: Secondary | ICD-10-CM | POA: Diagnosis not present

## 2018-06-08 DIAGNOSIS — G9340 Encephalopathy, unspecified: Secondary | ICD-10-CM | POA: Diagnosis not present

## 2018-06-08 DIAGNOSIS — I4891 Unspecified atrial fibrillation: Secondary | ICD-10-CM | POA: Diagnosis not present

## 2018-06-08 DIAGNOSIS — R1310 Dysphagia, unspecified: Secondary | ICD-10-CM | POA: Diagnosis not present

## 2018-06-08 DIAGNOSIS — C50919 Malignant neoplasm of unspecified site of unspecified female breast: Secondary | ICD-10-CM | POA: Diagnosis not present

## 2018-06-08 DIAGNOSIS — J449 Chronic obstructive pulmonary disease, unspecified: Secondary | ICD-10-CM | POA: Diagnosis not present

## 2018-06-09 ENCOUNTER — Ambulatory Visit (INDEPENDENT_AMBULATORY_CARE_PROVIDER_SITE_OTHER): Admitting: *Deleted

## 2018-06-09 DIAGNOSIS — R443 Hallucinations, unspecified: Secondary | ICD-10-CM | POA: Diagnosis not present

## 2018-06-09 DIAGNOSIS — I442 Atrioventricular block, complete: Secondary | ICD-10-CM | POA: Diagnosis not present

## 2018-06-09 LAB — BASIC METABOLIC PANEL
BUN: 14 (ref 4–21)
CREATININE: 0.7 (ref <=1.1)
Glucose: 76
POTASSIUM: 3.9 (ref 3.4–5.3)
Sodium: 141 (ref 137–147)

## 2018-06-09 LAB — CBC AND DIFFERENTIAL
HCT: 35 — AB (ref 36–46)
Hemoglobin: 11.6 — AB (ref 12.0–16.0)
PLATELETS: 304 (ref 150–399)
WBC: 6.6

## 2018-06-09 LAB — HEPATIC FUNCTION PANEL
ALT: 17 (ref 7–35)
AST: 23 (ref 13–35)
Alkaline Phosphatase: 50 (ref 25–125)
Bilirubin, Total: 0.6

## 2018-06-09 NOTE — Patient Instructions (Signed)
Patient needs Generator Change out (battery change out), will send message to Dr. Jackalyn Lombard RN to make aware. She will call you to schedule procedure.   Generator change out is an outpatient procedure  Call [facility] with questions or concerns [phone]

## 2018-06-09 NOTE — Progress Notes (Signed)
Device at [facility] on April 27, 2018. Per JA note 03/09/18, ok to schedule gen change without another office visit. Message sent to Myrtie Hawk to schedule Gen change.  Pacemaker check in clinic. Thresholds, sensing, impedances consistent with previous measurements. Device programmed to maximize longevity. 2.2% AT/AF burden, no EGMs available, longest 15 minutes 48 seconds, Peak A rate 175 bpm. Episode Triggers are off. Device programmed at appropriate safety margins. Histogram distribution appropriate for patient activity level. Device programmed to optimize intrinsic conduction. Patient education completed. Plan to schedule gen change.

## 2018-06-10 ENCOUNTER — Other Ambulatory Visit: Payer: Self-pay | Admitting: *Deleted

## 2018-06-10 DIAGNOSIS — N390 Urinary tract infection, site not specified: Secondary | ICD-10-CM | POA: Diagnosis not present

## 2018-06-10 LAB — COMPLETE METABOLIC PANEL WITH GFR
Albumin: 2.9
CALCIUM: 8.4
CO2: 32
Chloride: 104
GFR CALC NON AF AMER: 75
Globulin: 2.1
TOTAL PROTEIN: 5 g/dL

## 2018-06-13 DIAGNOSIS — R109 Unspecified abdominal pain: Secondary | ICD-10-CM | POA: Diagnosis not present

## 2018-06-13 DIAGNOSIS — R309 Painful micturition, unspecified: Secondary | ICD-10-CM | POA: Diagnosis not present

## 2018-06-13 DIAGNOSIS — R509 Fever, unspecified: Secondary | ICD-10-CM | POA: Diagnosis not present

## 2018-06-14 ENCOUNTER — Telehealth: Payer: Self-pay

## 2018-06-14 NOTE — Telephone Encounter (Signed)
Spoke with [name].    Per daughter Pt's dementia is worsening and Pt has stage 4 cancer.  At this time family would not like to replace Pt's generator.  Family is aware Pt is dependant on device.  Family aware ERI was tripped on April 27, 2018 and no battery guarantees after July 28, 2018.  Family will call office if they decide to replace generator or if Pt passes.  Will notify device clinic and Dr. Rayann Heman.

## 2018-06-14 NOTE — Telephone Encounter (Signed)
Noted in [place]. Will forward to Dr.Allred.

## 2018-06-15 DIAGNOSIS — J449 Chronic obstructive pulmonary disease, unspecified: Secondary | ICD-10-CM | POA: Diagnosis not present

## 2018-06-15 DIAGNOSIS — C50919 Malignant neoplasm of unspecified site of unspecified female breast: Secondary | ICD-10-CM | POA: Diagnosis not present

## 2018-06-15 DIAGNOSIS — C7951 Secondary malignant neoplasm of bone: Secondary | ICD-10-CM | POA: Diagnosis not present

## 2018-06-15 DIAGNOSIS — G9340 Encephalopathy, unspecified: Secondary | ICD-10-CM | POA: Diagnosis not present

## 2018-06-15 DIAGNOSIS — N390 Urinary tract infection, site not specified: Secondary | ICD-10-CM | POA: Diagnosis not present

## 2018-06-15 DIAGNOSIS — I4891 Unspecified atrial fibrillation: Secondary | ICD-10-CM | POA: Diagnosis not present

## 2018-06-15 DIAGNOSIS — R1310 Dysphagia, unspecified: Secondary | ICD-10-CM | POA: Diagnosis not present

## 2018-06-17 DIAGNOSIS — R1310 Dysphagia, unspecified: Secondary | ICD-10-CM | POA: Diagnosis not present

## 2018-06-17 DIAGNOSIS — J449 Chronic obstructive pulmonary disease, unspecified: Secondary | ICD-10-CM | POA: Diagnosis not present

## 2018-06-17 DIAGNOSIS — I4891 Unspecified atrial fibrillation: Secondary | ICD-10-CM | POA: Diagnosis not present

## 2018-06-17 DIAGNOSIS — C7951 Secondary malignant neoplasm of bone: Secondary | ICD-10-CM | POA: Diagnosis not present

## 2018-06-17 DIAGNOSIS — C50919 Malignant neoplasm of unspecified site of unspecified female breast: Secondary | ICD-10-CM | POA: Diagnosis not present

## 2018-06-17 DIAGNOSIS — G9340 Encephalopathy, unspecified: Secondary | ICD-10-CM | POA: Diagnosis not present

## 2018-06-19 DIAGNOSIS — N183 Chronic kidney disease, stage 3 (moderate): Secondary | ICD-10-CM | POA: Diagnosis not present

## 2018-06-19 DIAGNOSIS — C7951 Secondary malignant neoplasm of bone: Secondary | ICD-10-CM | POA: Diagnosis not present

## 2018-06-19 DIAGNOSIS — C50919 Malignant neoplasm of unspecified site of unspecified female breast: Secondary | ICD-10-CM | POA: Diagnosis not present

## 2018-06-19 DIAGNOSIS — G9340 Encephalopathy, unspecified: Secondary | ICD-10-CM | POA: Diagnosis not present

## 2018-06-19 DIAGNOSIS — K219 Gastro-esophageal reflux disease without esophagitis: Secondary | ICD-10-CM | POA: Diagnosis not present

## 2018-06-19 DIAGNOSIS — I4891 Unspecified atrial fibrillation: Secondary | ICD-10-CM | POA: Diagnosis not present

## 2018-06-19 DIAGNOSIS — J449 Chronic obstructive pulmonary disease, unspecified: Secondary | ICD-10-CM | POA: Diagnosis not present

## 2018-06-19 DIAGNOSIS — J301 Allergic rhinitis due to pollen: Secondary | ICD-10-CM | POA: Diagnosis not present

## 2018-06-19 DIAGNOSIS — K589 Irritable bowel syndrome without diarrhea: Secondary | ICD-10-CM | POA: Diagnosis not present

## 2018-06-19 DIAGNOSIS — R1310 Dysphagia, unspecified: Secondary | ICD-10-CM | POA: Diagnosis not present

## 2018-06-19 DIAGNOSIS — D630 Anemia in neoplastic disease: Secondary | ICD-10-CM | POA: Diagnosis not present

## 2018-06-21 ENCOUNTER — Non-Acute Institutional Stay (SKILLED_NURSING_FACILITY): Payer: Medicare Other | Admitting: Internal Medicine

## 2018-06-21 ENCOUNTER — Encounter: Payer: Self-pay | Admitting: Internal Medicine

## 2018-06-21 DIAGNOSIS — I442 Atrioventricular block, complete: Secondary | ICD-10-CM

## 2018-06-21 DIAGNOSIS — C7951 Secondary malignant neoplasm of bone: Secondary | ICD-10-CM

## 2018-06-21 DIAGNOSIS — F0391 Unspecified dementia with behavioral disturbance: Secondary | ICD-10-CM

## 2018-06-21 DIAGNOSIS — C3482 Malignant neoplasm of overlapping sites of left bronchus and lung: Secondary | ICD-10-CM | POA: Diagnosis not present

## 2018-06-21 DIAGNOSIS — M316 Other giant cell arteritis: Secondary | ICD-10-CM | POA: Diagnosis not present

## 2018-06-21 DIAGNOSIS — C50919 Malignant neoplasm of unspecified site of unspecified female breast: Secondary | ICD-10-CM | POA: Diagnosis not present

## 2018-06-21 DIAGNOSIS — J449 Chronic obstructive pulmonary disease, unspecified: Secondary | ICD-10-CM

## 2018-06-21 NOTE — Progress Notes (Signed)
Provider:  Blanchie Serve MD  Location:  [facility] Room Number: 106 Place of Service:  SNF (31)  PCP: Blanchie Serve, MD Patient Care Team: Blanchie Serve, MD as PCP - General (Internal Medicine) Lauren Skates, MD as Consulting Physician (General Surgery) [name], [name], MD as Consulting Physician (Oncology) [name], MD as Consulting Physician (Radiation Oncology) Rockwell Germany, RN as Registered Nurse Mauro Kaufmann, RN as Registered Nurse Gentry Fitz, MD as Consulting Physician (Family Medicine) [name], [name], NP as Nurse Practitioner (Internal Medicine) [place] Grayer, MD as Consulting Physician (Cardiology)  Extended Emergency Contact Information Primary Emergency Contact: Matthews,[name] Address: [address]          [place], [place] [address] [place] Mobile Phone: [phone] Relation: Daughter Secondary Emergency Contact: Clent Demark [place] Phone: [phone] Relation: Other  Code Status: DNR/ hospice  Goals of Care: Advanced Directive information Advanced Directives 05/31/2018  Does Patient Have a Medical Advance Directive? Yes  Type of Paramedic of [place];Out of facility DNR (pink MOST or yellow form)  Does patient want to make changes to medical advance directive? No - Patient declined  Copy of [place] in Chart? Yes  Would patient like information on creating a medical advance directive? -  Pre-existing out of facility DNR order (yellow form or pink MOST form) Yellow form placed in chart (order not valid for inpatient use);Pink MOST form placed in chart (order not valid for inpatient use)      Chief Complaint  Patient presents with  . New Admit To SNF    HPI: Patient is a 84 y.o. female seen today for admission visit. She was residing in ALF prior to this and with her progressive memory loss and exit seeking behavior, she is  now admitted to memory care unit in SNF. She is seen in her room today with her daughter present. She has history of breast cancer s/p bilateral mastectomy. She is followed by hospice services. She requires 1 person limited assistance with her ADLs. She is continent with her bowel and bladder.   Past Medical History:  Diagnosis Date  . Asthma    "as a child"  . Breast cancer ([place]) 12/08/2014   Bilateral  . Cancer of central portion of female breast ([place]) 12/04/2014  . Cancer of central portion of female breast ([place]) 12/04/2014  . Complete heart block ([place]) 1999   s/p PPM by Dr Olevia Perches, most recent generator change 2009  . Complication of anesthesia   . COPD (chronic obstructive pulmonary disease) ([place])   . Dementia   . Diverticulosis   . DJD (degenerative joint disease)    RA  . Esophageal stricture   . Family history of adverse reaction to anesthesia    Daughter N/V  . GERD (gastroesophageal reflux disease)   . Giant cell arteritis ([place])   . Goiter   . Hemorrhoids   . Hiatal hernia   . IBS (irritable bowel syndrome)   . Paroxysmal atrial fibrillation ([place]) 12/17/2015   asymptomatic, <1%, determined on regular pacemaker interrogation  . Pneumonia   . PONV (postoperative nausea and vomiting)   . S/P radiation therapy 04/24/15 completed   T-11  . Wears glasses    Past Surgical History:  Procedure Laterality Date  . ANAL FISSURE REPAIR    . ARTERY BIOPSY Left 01/03/2013   Procedure: BIOPSY LEFT TEMPORAL ARTERY;  Surgeon: Ascencion Dike, MD;  Location: [facility];  Service: ENT;  Laterality: Left;  . BREAST BIOPSY Right   . CHOLECYSTECTOMY    . COLONOSCOPY    . FRACTURE SURGERY     fx lt wrist  . HEMORRHOID SURGERY    . INSERT / REPLACE / [facility]   Gen change ([facility]) by Dr Olevia Perches 2009  . IR FLUORO GUIDE PORT INSERTION RIGHT  12/22/2017  . IR US GUIDE VASC ACCESS RIGHT  12/22/2017  . LUNG REMOVAL, PARTIAL  1950   left lower lobectomy for brochiectasis   .  MASTECTOMY Left 02/08/2018  . MOUTH SURGERY     patient unaware  . RECTAL POLYPECTOMY    . RENAL CYST EXCISION    . TONSILLECTOMY    . TOTAL MASTECTOMY Left 02/08/2018  . TOTAL MASTECTOMY Left 02/08/2018   Procedure: TOTAL MASTECTOMY LEFT;  Surgeon: Lauren Skates, MD;  Location: [facility];  Service: General;  Laterality: Left;    reports that she has never smoked. She has never used smokeless tobacco. She reports that she does not drink alcohol or use drugs. Social History   Socioeconomic History  . Marital status: Married    Spouse name: Not on file  . Number of children: 4  . Years of education: Not on file  . Highest education level: Not on file  Occupational History  . Occupation: Retired  Scientific laboratory technician  . Financial resource strain: Not on file  . Food insecurity:    Worry: Not on file    Inability: Not on file  . Transportation needs:    Medical: Not on file    Non-medical: Not on file  Tobacco Use  . Smoking status: Never Smoker  . Smokeless tobacco: Never Used  Substance and Sexual Activity  . Alcohol use: Never    Alcohol/week: 0.0 standard drinks    Frequency: Never    Comment: Occasional   . Drug use: No  . Sexual activity: Not Currently  Lifestyle  . Physical activity:    Days per week: Not on file    Minutes per session: Not on file  . Stress: Not on file  Relationships  . Social connections:    Talks on phone: Not on file    Gets together: Not on file    Attends religious service: Not on file    Active member of club or organization: Not on file    Attends meetings of clubs or organizations: Not on file    Relationship status: Not on file  . Intimate partner violence:    Fear of current or ex partner: Not on file    Emotionally abused: Not on file    Physically abused: Not on file    Forced sexual activity: Not on file  Other Topics Concern  . Not on file  Social History Narrative   Social History     Marital status: Widowed          Spouse name:                         Years of education:                 Number of children: 4           Occupational History   Occupation: Copywriter, advertising  Retired : Yes                                      Social History Main Topics     Smoking status: Never Smoker                                                                  Smokeless tobacco: Never Used                         Alcohol use:         0.0 oz/week        Comment:      Drug use: No               Sexual activity:        Other Topics            Concern     None on file      Social History Narrative     Do you drink/eat things with caffeine?    Yes     Marital status: Widowed.  What year were you married? 1948     Do you have a living will? Yes     Do you have DNR form? Yes     Do you have signed POA/HPOA forms? Yes             Functional Status Survey:    Family History  Problem Relation Age of Onset  . Heart disease Mother   . Breast cancer Mother   . Stomach cancer Father   . Alcohol abuse Father   . Seizures Son   . Thyroid disease Sister     Health Maintenance  Topic Date Due  . TETANUS/TDAP  07/24/2017  . INFLUENZA VACCINE  05/19/2018  . DEXA SCAN  Completed  . PNA vac Low Risk Adult  Completed    Allergies  Allergen Reactions  . Amoxicillin-Pot Clavulanate Other (See Comments)    Upset stomach Has patient had a PCN reaction causing immediate rash, facial/tongue/throat swelling, SOB or lightheadedness with hypotension: No Has patient had a PCN reaction causing severe rash involving mucus membranes or skin necrosis: No Has patient had a PCN reaction that required hospitalization: No Has patient had a PCN reaction occurring within the last 10 years: No If all of the above answers are "NO", then may proceed with Cephalosporin use.   Marland Kitchen Morphine And Related Nausea Only    Outpatient Encounter Medications as of 06/21/2018  Medication Sig  . feeding supplement  (BOOST / RESOURCE BREEZE) LIQD Take 1 Container by mouth daily.   Marland Kitchen LORazepam (ATIVAN) 0.5 MG tablet Take 0.5 mg by mouth daily.   . magnesium hydroxide (MILK OF MAGNESIA) 400 MG/5ML suspension Take 30 mLs by mouth daily as needed for mild constipation.  . metoprolol succinate (TOPROL-XL) 50 MG 24 hr tablet Take 50 mg by mouth daily. Take with or immediately following a meal.  . mirtazapine (REMERON) 15 MG tablet Take 7.5 mg by mouth at bedtime.  . ondansetron (ZOFRAN) 4 MG tablet Take 4 mg by mouth 3 (three) times daily.   Marland Kitchen oxycodone (OXY-IR)  5 MG capsule Take 5 mg by mouth every 6 (six) hours as needed.  . predniSONE (DELTASONE) 5 MG tablet Take 5 mg by mouth daily with breakfast.  . QUEtiapine (SEROQUEL) 25 MG tablet Take 25 mg by mouth at bedtime.  . sennosides-docusate sodium (SENOKOT-S) 8.6-50 MG tablet Take 1 tablet by mouth at bedtime.  . traMADol (ULTRAM) 50 MG tablet Take 50 mg by mouth every 6 (six) hours as needed (FOR PAIN).    No facility-administered encounter medications on file as of 06/21/2018.     Review of Systems  Unable to perform ROS: Dementia  Constitutional: Negative for appetite change, fatigue and fever.  HENT: Negative for congestion, ear pain, hearing loss, mouth sores, rhinorrhea, sinus pressure, sore throat and trouble swallowing.   Eyes: Positive for visual disturbance.  Respiratory: Negative for cough, shortness of breath and wheezing.   Cardiovascular: Negative for chest pain and palpitations.  Gastrointestinal: Negative for abdominal pain, constipation, diarrhea, nausea and vomiting.  Genitourinary: Negative for dysuria.  Musculoskeletal: Negative for arthralgias and back pain.       Had a fall last week in ALF and scraped both her knees, ambulates without assistive device  Skin: Negative for rash and wound.  Neurological: Negative for dizziness and headaches.  Hematological: Bruises/bleeds easily.  Psychiatric/Behavioral: Positive for behavioral  problems and confusion. The patient is nervous/anxious.     Vitals:   06/21/18 1447  BP: 130/70  Pulse: 72  Resp: 18  Temp: 98.7 F (37.1 C)  SpO2: 98%  Weight: 101 lb 3.2 oz (45.9 kg)  Height: 5' (1.524 m)   Body mass index is 19.76 kg/m.   Wt Readings from Last 3 Encounters:  06/21/18 101 lb 3.2 oz (45.9 kg)  06/07/18 106 lb (48.1 kg)  05/31/18 106 lb (48.1 kg)   Physical Exam  Constitutional: No distress.  Thin built frail elderly female  HENT:  Head: Normocephalic and atraumatic.  Mouth/Throat: Oropharynx is clear and moist. No oropharyngeal exudate.  Eyes: Pupils are equal, round, and reactive to light. Conjunctivae and EOM are normal. Right eye exhibits no discharge. Left eye exhibits no discharge.  Has corrective glasses  Neck: Normal range of motion. Neck supple.  Cardiovascular: Normal rate and regular rhythm.  Murmur heard. pacemaker to left chest wall  Pulmonary/Chest: Effort normal and breath sounds normal. She has no wheezes. She has no rales.  Abdominal: Soft. Bowel sounds are normal. There is no tenderness. There is no guarding.  Musculoskeletal: Normal range of motion. She exhibits no edema.  Lymphadenopathy:    She has no cervical adenopathy.  Neurological: She is alert.  Oriented to person and place only  Skin: Skin is warm and dry. She is not diaphoretic.  Skin tear to both shins with dressing in place  Psychiatric:  Pleasantly confused, appears anxious    Labs reviewed: Basic Metabolic Panel: Recent Labs    03/30/18 1327 03/31/18 0406 04/01/18 0349 04/26/18 05/02/18 06/09/18  NA 128* 134* 135 132* 136* 141  K 3.0* 4.9 3.8 4.0 4.6 3.9  CL 93* 102 107 95 99 104  CO2 26 20* 23 30 32 32  GLUCOSE 107* 153* 102*  --   --   --   BUN 5* 6 9 15 10 14   CREATININE 0.64 0.76 0.64 0.8 0.8 0.7  CALCIUM 8.4* 8.5* 7.7* 8.0 8.9 8.4  MG  --  1.8  --   --   --   --    Liver Function Tests: Recent  Labs    02/17/18 03/04/18 1034  03/30/18 1327  04/26/18 06/09/18  AST 37 37*   < > 46* 28 23  ALT 32 35   < > 34 20 17  ALKPHOS 71 92   < > 69 70 50  BILITOT 0.7 0.6  --  0.7  --   --   PROT  --  6.6  --  6.1* 5.1 5.0  ALBUMIN 3.7 3.3*   < > 3.0* 3.0 2.9   < > = values in this interval not displayed.   No results for input(s): LIPASE, AMYLASE in the last 8760 hours. No results for input(s): AMMONIA in the last 8760 hours. CBC: Recent Labs    02/03/18 1344  03/04/18 1034  03/30/18 1327 03/31/18 0406 04/01/18 0349 04/26/18 06/09/18  WBC 7.1   < > 5.8   < > 8.8 8.2 9.5 10.0 6.6  NEUTROABS 6.0  --  4.0  --  6.1  --   --   --   --   HGB 12.6   < > 12.1   < > 12.3 12.5 10.8* 11.4* 11.6*  HCT 39.5   < > 37.1   < > 36.4 37.2 32.6* 34* 35*  MCV 89.4   < > 85.9  --  81.8 81.6 83.0  --   --   PLT 225   < > 198   < > 188 247 223 284 304   < > = values in this interval not displayed.   Cardiac Enzymes: No results for input(s): CKTOTAL, CKMB, CKMBINDEX, TROPONINI in the last 8760 hours. BNP: Invalid input(s): POCBNP Lab Results  Component Value Date   HGBA1C 6.1 (H) 02/09/2018   Lab Results  Component Value Date   TSH 0.719 03/31/2018   Lab Results  Component Value Date   ZOXWRUEA54 098 02/22/2017   No results found for: FOLATE No results found for: IRON, TIBC, FERRITIN  Imaging and Procedures obtained prior to SNF admission: Dg Chest 2 View  Result Date: 03/30/2018 CLINICAL DATA:  Chest tightness with cough. EXAM: CHEST - 2 VIEW COMPARISON:  PET-CT 10/15/2017.  Chest x-ray 02/27/2013 FINDINGS: 1151 hours. Lungs are hyperexpanded. The lungs are clear without focal pneumonia, edema, pneumothorax or pleural effusion. Interstitial markings are diffusely coarsened with chronic features. Left suprahilar lesion compatible with the abnormality seen on previous PET-CT. Calcified granuloma noted in the medial parahilar right lung. The cardiopericardial silhouette is within normal limits for size. Left permanent pacemaker again noted.  Telemetry leads overlie the chest. IMPRESSION: 1. Hyperexpansion without acute cardiopulmonary findings. 2. Left suprahilar opacity compatible with left upper lobe lesion seen on previous PET-CT. Electronically Signed   By: Misty Stanley M.D.   On: 03/30/2018 12:41   Ct Angio Chest Pe W/cm &/or Wo Cm  Result Date: 03/30/2018 CLINICAL DATA:  Upper respiratory infection.  Coughing.  Wheezing. EXAM: CT ANGIOGRAPHY CHEST WITH CONTRAST TECHNIQUE: Multidetector CT imaging of the chest was performed using the standard protocol during bolus administration of intravenous contrast. Multiplanar CT image reconstructions and MIPs were obtained to evaluate the vascular anatomy. CONTRAST:  168mL ISOVUE-370 IOPAMIDOL (ISOVUE-370) INJECTION 76% COMPARISON:  PET-CT 10/15/2017 FINDINGS: Cardiovascular: LEFT-sided pacemaker. No significant vascular findings. Normal heart size. No pericardial effusion. Port in the anterior chest wall with tip in distal SVC. Mediastinum/Nodes: No axillary or supraclavicular adenopathy. Nodular enlargement of the LEFT lobe of the thyroid gland nodules measure 2 cm. These nodules were not hypermetabolic comparison PET-CT scan. No mediastinal hilar  adenopathy.  Small hiatal hernia. Lungs/Pleura: Small bilateral pleural effusions. Calcified scar in the LEFT lower lobe following LEFT lower lobe lobectomy. Calcified granuloma in the RIGHT upper lobe. No suspicious pulmonary nodules. A ground-glass opacity in the superior segment of the posterior LEFT upper lobe measures 3.8 by 2.2 cm unchanged (image 41/10. This mixed density nodule was mildly hypermetabolic on comparison PET-CT scan. Upper Abdomen: Limited view of the liver, kidneys, pancreas are unremarkable. Normal adrenal glands. Musculoskeletal: Stable sclerotic lesion in the T9 vertebral body. Seroma in the LEFT breast at mastectomy site. Review of the MIP images confirms the above findings. IMPRESSION: 1. Elongated sub solid lesion in the LEFT upper  lobe unchanged from comparison PET and CT scan. 2. Bilateral small effusions. 3. Small hiatal hernia. Electronically Signed   By: Suzy Bouchard M.D.   On: 03/30/2018 16:16    Assessment/Plan  1. Dementia with behavioral disturbance, unspecified dementia type Supportive care with frequent redirection, goal of care is for comfort. Her malignancy and cardiovascular issues with vascular component could be contributing to her memory issue. Increase remeron to 15 mg daily. Continue seroquel 25 mg daily. Continue ativan 0.5 mg daily. Hospice services to continue  2. AV BLOCK, COMPLETE S/p pacemaker. Continue monitoring.  3. Temporal arteritis (HCC) Continue chronic daily prednisone 5 mg dosing.   4. COPD with chronic bronchitis ([place]) Supportive care, breathing currently stable  5. Malignant neoplasm of overlapping sites of left lung (HCC) Breathing stable, no cough, monitor  6. Carcinoma of breast metastatic to bone, unspecified laterality ([place]) Continue prn trmamadol and prn oxyIR for pain management.     Family/ staff Communication: reviewed care plan with patient, her daughter and charge nurse.    Labs/tests ordered: none  Blanchie Serve, MD Internal Medicine [facility] [address] [place], [place] [address] Cell Phone (Monday-Friday 8 am - 5 pm): [phone] On Call: [phone] and follow prompts after 5 pm and on weekends Office Phone: [phone] Office Fax: [phone]

## 2018-06-22 DIAGNOSIS — C7951 Secondary malignant neoplasm of bone: Secondary | ICD-10-CM | POA: Diagnosis not present

## 2018-06-22 DIAGNOSIS — G9340 Encephalopathy, unspecified: Secondary | ICD-10-CM | POA: Diagnosis not present

## 2018-06-22 DIAGNOSIS — I4891 Unspecified atrial fibrillation: Secondary | ICD-10-CM | POA: Diagnosis not present

## 2018-06-22 DIAGNOSIS — R1310 Dysphagia, unspecified: Secondary | ICD-10-CM | POA: Diagnosis not present

## 2018-06-22 DIAGNOSIS — J449 Chronic obstructive pulmonary disease, unspecified: Secondary | ICD-10-CM | POA: Diagnosis not present

## 2018-06-22 DIAGNOSIS — C50919 Malignant neoplasm of unspecified site of unspecified female breast: Secondary | ICD-10-CM | POA: Diagnosis not present

## 2018-06-27 ENCOUNTER — Non-Acute Institutional Stay (SKILLED_NURSING_FACILITY): Payer: Medicare Other | Admitting: Nurse Practitioner

## 2018-06-27 ENCOUNTER — Encounter: Payer: Self-pay | Admitting: Nurse Practitioner

## 2018-06-27 DIAGNOSIS — L989 Disorder of the skin and subcutaneous tissue, unspecified: Secondary | ICD-10-CM | POA: Diagnosis not present

## 2018-06-27 DIAGNOSIS — H6191 Disorder of right external ear, unspecified: Secondary | ICD-10-CM | POA: Insufficient documentation

## 2018-06-27 DIAGNOSIS — I10 Essential (primary) hypertension: Secondary | ICD-10-CM

## 2018-06-27 NOTE — Assessment & Plan Note (Signed)
Lower end of the right helix, ulcerated, scabbed, mild erythema, discomfort when touched area, duration is uncertain. The patient has history of skin cancer, her daughter-POA stated it resemble the previous removed skin cancer. Will refer to Dermatology for evaluation and treatment.

## 2018-06-27 NOTE — Progress Notes (Signed)
Location:  [facility] Room Number: 106 Place of Service:  SNF (31) Provider:  Srinidhi Landers,ManXie  NP  Blanchie Serve, MD  Patient Care Team: Blanchie Serve, MD as PCP - General (Internal Medicine) Fanny Skates, MD as Consulting Physician (General Surgery) [name], [name], MD as Consulting Physician (Oncology) [name], MD as Consulting Physician (Radiation Oncology) Rockwell Germany, RN as Registered Nurse Mauro Kaufmann, RN as Registered Nurse Gentry Fitz, MD as Consulting Physician (Family Medicine) Jaideep Pollack X, NP as Nurse Practitioner (Internal Medicine) Thompson Grayer, MD as Consulting Physician (Cardiology)  Extended Emergency Contact Information Primary Emergency Contact: Matthews,[name] Address: [address]          [place], [place] [address] [place] Phone: [phone] Relation: Daughter Secondary Emergency Contact: Clent [place] Phone: [phone] Relation: Other  Code Status:  DNR Goals of care: Advanced Directive information Advanced Directives 06/27/2018  Does Patient Have a Medical Advance Directive? Yes  Type of Paramedic of [place];Out of facility DNR (pink MOST or yellow form)  Does patient want to make changes to medical advance directive? No - Patient declined  Copy of [place] in Chart? Yes  Would patient like information on creating a medical advance directive? -  Pre-existing out of facility DNR order (yellow form or pink MOST form) Yellow form placed in chart (order not valid for inpatient use);Pink MOST form placed in chart (order not valid for inpatient use)     Chief Complaint  Patient presents with  . Acute Visit    lesion on (R) ear/left face    HPI:  Pt is a 84 y.o. female seen today for an acute visit for Lower end of the right helix, ulcerated, scabbed, mild erythema, discomfort when touched area, duration  is uncertain. The patient has history of skin cancer, her daughter-POA stated it resemble the previous removed skin cancer. A small scabbed skin lesion @ left cheek, duration is uncertain, no ulceration, no redness in the area.  Hx of HTN, incidental findings of low BP measurements, she denied headache, change of vision, dizziness, chest pain/pressue, palpitation, or SOB, on Metoprolol 50mg  daily.     Past Medical History:  Diagnosis Date  . Asthma    "as a child"  . Breast cancer ([place]) 12/08/2014   Bilateral  . Cancer of central portion of female breast ([place]) 12/04/2014  . Cancer of central portion of female breast ([place]) 12/04/2014  . Complete heart block ([place]) 1999   s/p PPM by Dr Olevia Perches, most recent generator change 2009  . Complication of anesthesia   . COPD (chronic obstructive pulmonary disease) ([place])   . Dementia   . Diverticulosis   . DJD (degenerative joint disease)    RA  . Esophageal stricture   . Family history of adverse reaction to anesthesia    Daughter N/V  . GERD (gastroesophageal reflux disease)   . Giant cell arteritis ([place])   . Goiter   . Hemorrhoids   . Hiatal hernia   . IBS (irritable bowel syndrome)   . Paroxysmal atrial fibrillation ([place]) 12/17/2015   asymptomatic, <1%, determined on regular pacemaker interrogation  . Pneumonia   . PONV (postoperative nausea and vomiting)   . S/P radiation therapy 04/24/15 completed   T-11  . Wears glasses    Past Surgical History:  Procedure Laterality Date  . ANAL FISSURE REPAIR    . ARTERY BIOPSY Left  01/03/2013   Procedure: BIOPSY LEFT TEMPORAL ARTERY;  Surgeon: Ascencion Dike, MD;  Location: [facility];  Service: ENT;  Laterality: Left;  . BREAST BIOPSY Right   . CHOLECYSTECTOMY    . COLONOSCOPY    . FRACTURE SURGERY     fx lt wrist  . HEMORRHOID SURGERY    . INSERT / REPLACE / [place]   Gen change ([facility]) by Dr Olevia Perches 2009  . IR FLUORO GUIDE PORT INSERTION RIGHT  12/22/2017  . IR US  GUIDE VASC ACCESS RIGHT  12/22/2017  . LUNG REMOVAL, PARTIAL  1950   left lower lobectomy for brochiectasis   . MASTECTOMY Left 02/08/2018  . MOUTH SURGERY     patient unaware  . RECTAL POLYPECTOMY    . RENAL CYST EXCISION    . TONSILLECTOMY    . TOTAL MASTECTOMY Left 02/08/2018  . TOTAL MASTECTOMY Left 02/08/2018   Procedure: TOTAL MASTECTOMY LEFT;  Surgeon: Fanny Skates, MD;  Location: [facility];  Service: General;  Laterality: Left;    Allergies  Allergen Reactions  . Amoxicillin-Pot Clavulanate Other (See Comments)    Upset stomach Has patient had a PCN reaction causing immediate rash, facial/tongue/throat swelling, SOB or lightheadedness with hypotension: No Has patient had a PCN reaction causing severe rash involving mucus membranes or skin necrosis: No Has patient had a PCN reaction that required hospitalization: No Has patient had a PCN reaction occurring within the last 10 years: No If all of the above answers are "NO", then may proceed with Cephalosporin use.   Marland Kitchen Morphine And Related Nausea Only    Outpatient Encounter Medications as of 06/27/2018  Medication Sig  . feeding supplement (BOOST / RESOURCE BREEZE) LIQD Take 1 Container by mouth daily.   Marland Kitchen LORazepam (ATIVAN) 0.5 MG tablet Take 0.5 mg by mouth every 6 (six) hours as needed for anxiety.   Marland Kitchen LORazepam (ATIVAN) 0.5 MG tablet Take 0.5 mg by mouth 2 (two) times daily. Take twice daily at 8:00am  and 5pm  . magnesium hydroxide (MILK OF MAGNESIA) 400 MG/5ML suspension Take 30 mLs by mouth daily as needed for mild constipation.  . metoprolol succinate (TOPROL-XL) 50 MG 24 hr tablet Take 50 mg by mouth daily. Take with or immediately following a meal.  . mirtazapine (REMERON) 15 MG tablet Take 15 mg by mouth at bedtime.   . ondansetron (ZOFRAN) 4 MG tablet Take 4 mg by mouth 3 (three) times daily.   Marland Kitchen oxycodone (OXY-IR) 5 MG capsule Take 5 mg by mouth every 6 (six) hours as needed.  . predniSONE (DELTASONE) 5 MG tablet Take  5 mg by mouth daily with breakfast.  . QUEtiapine (SEROQUEL) 25 MG tablet Take 25 mg by mouth at bedtime.  . sennosides-docusate sodium (SENOKOT-S) 8.6-50 MG tablet Take 1 tablet by mouth at bedtime.  . traMADol (ULTRAM) 50 MG tablet Take 50 mg by mouth every 6 (six) hours as needed (FOR PAIN).    No facility-administered encounter medications on file as of 06/27/2018.    ROS was provided with assistance of staff and the patient's daughter Review of Systems  Constitutional: Negative for activity change, appetite change, chills, diaphoresis, fatigue and fever.  HENT: Negative for congestion and voice change.   Respiratory: Negative for cough, shortness of breath and wheezing.   Cardiovascular: Negative for chest pain, palpitations and leg swelling.  Musculoskeletal: Positive for gait problem.  Skin: Positive for wound.       Right ear  and left cheek skin lesions.   Neurological: Negative for dizziness, speech difficulty, weakness and headaches.       Memory lapses.   Psychiatric/Behavioral: Positive for agitation, behavioral problems and confusion. The patient is nervous/anxious.     Immunization History  Administered Date(s) Administered  . Influenza Split 07/24/2011, 06/20/2012  . Influenza Whole 10/19/2001, 07/25/2007, 08/02/2008, 07/18/2009, 07/10/2010  . Influenza,inj,Quad PF,6+ Mos 07/18/2013, 08/27/2014  . Influenza-Unspecified 07/31/2015, 08/19/2016, 08/09/2017  . Pneumococcal Conjugate-13 08/16/2017  . Pneumococcal Polysaccharide-23 10/19/1997, 01/17/2010  . Td 10/19/1996, 07/25/2007   Pertinent  Health Maintenance Due  Topic Date Due  . INFLUENZA VACCINE  05/19/2018  . DEXA SCAN  Completed  . PNA vac Low Risk Adult  Completed   Fall Risk  07/01/2017 02/22/2017 05/22/2015 03/22/2015 08/02/2014  Falls in the past year? No No No No No   Functional Status Survey:    Vitals:   06/27/18 1456  BP: (!) 100/58  Pulse: 80  Resp: 16  Temp: 97.8 F (36.6 C)  SpO2: 96%  Weight:  100 lb 12.8 oz (45.7 kg)  Height: 5\' 1"  (1.549 m)   Body mass index is 19.05 kg/m. Physical Exam  Constitutional: She appears well-developed and well-nourished.  HENT:  Head: Normocephalic and atraumatic.  Eyes: Pupils are equal, round, and reactive to light. EOM are normal.  Neck: Normal range of motion. Neck supple. No JVD present. No thyromegaly present.  Cardiovascular: Normal rate and regular rhythm.  Murmur heard. Pacemaker.   Pulmonary/Chest: Effort normal and breath sounds normal.  Abdominal: Soft. Bowel sounds are normal. She exhibits no distension. There is no tenderness.  Musculoskeletal: She exhibits no edema.  Self transfer, ambulates with walker.   Neurological: She is alert. No cranial nerve deficit. She exhibits normal muscle tone. Coordination normal.  Oriented to person and her room on unit.   Skin: Skin is warm and dry. There is erythema.  A skin lesion @ the right ear helix, <1cm in diameter, scaly, ulcerated, mild erythema in the area. A scaly, not ulcerated, no redness skin lesion left cheek <1cm.   Psychiatric: She has a normal mood and affect.    Labs reviewed: Recent Labs    03/30/18 1327 03/31/18 0406 04/01/18 0349 04/26/18 05/02/18 06/09/18  NA 128* 134* 135 132* 136* 141  K 3.0* 4.9 3.8 4.0 4.6 3.9  CL 93* 102 107 95 99 104  CO2 26 20* 23 30 32 32  GLUCOSE 107* 153* 102*  --   --   --   BUN 5* 6 9 15 10 14   CREATININE 0.64 0.76 0.64 0.8 0.8 0.7  CALCIUM 8.4* 8.5* 7.7* 8.0 8.9 8.4  MG  --  1.8  --   --   --   --    Recent Labs    02/17/18 03/04/18 1034  03/30/18 1327 04/26/18 06/09/18  AST 37 37*   < > 46* 28 23  ALT 32 35   < > 34 20 17  ALKPHOS 71 92   < > 69 70 50  BILITOT 0.7 0.6  --  0.7  --   --   PROT  --  6.6  --  6.1* 5.1 5.0  ALBUMIN 3.7 3.3*   < > 3.0* 3.0 2.9   < > = values in this interval not displayed.   Recent Labs    02/03/18 1344  03/04/18 1034  03/30/18 1327 03/31/18 0406 04/01/18 0349 04/26/18 06/09/18  WBC 7.1    < > 5.8   < >  8.8 8.2 9.5 10.0 6.6  NEUTROABS 6.0  --  4.0  --  6.1  --   --   --   --   HGB 12.6   < > 12.1   < > 12.3 12.5 10.8* 11.4* 11.6*  HCT 39.5   < > 37.1   < > 36.4 37.2 32.6* 34* 35*  MCV 89.4   < > 85.9  --  81.8 81.6 83.0  --   --   PLT 225   < > 198   < > 188 247 223 284 304   < > = values in this interval not displayed.   Lab Results  Component Value Date   TSH 0.719 03/31/2018   Lab Results  Component Value Date   HGBA1C 6.1 (H) 02/09/2018   Lab Results  Component Value Date   CHOL 194 07/25/2007   HDL 62.7 07/25/2007   LDLCALC 118 (H) 07/25/2007   TRIG 69 07/25/2007   CHOLHDL 3.1 CALC 07/25/2007    Significant Diagnostic Results in last 30 days:  No results found.  Assessment/Plan: Skin lesion of right ear Lower end of the right helix, ulcerated, scabbed, mild erythema, discomfort when touched area, duration is uncertain. The patient has history of skin cancer, her daughter-POA stated it resemble the previous removed skin cancer. Will refer to Dermatology for evaluation and treatment.   Skin lesion of face Left cheek, <1cm, scaly, no ulceration, no redness in the area. The patient denied pain or discomfort when palpated. Will refer to dermatology for further evaluation in setting of Hx of skin cancer with similar presentation.   Essential hypertension Low blood pressure measurement noted, asymptomatic presently, reviewed Bps, noted no previous low Bp measurements. Will monitor Bp P bid x 7 days, then reevaluate.      Family/ staff Communication: plan of care reviewed with the patient and charge nurse.   Labs/tests ordered:  none  Time spend 25 minutes.

## 2018-06-27 NOTE — Assessment & Plan Note (Signed)
Left cheek, <1cm, scaly, no ulceration, no redness in the area. The patient denied pain or discomfort when palpated. Will refer to dermatology for further evaluation in setting of Hx of skin cancer with similar presentation.

## 2018-06-27 NOTE — Assessment & Plan Note (Signed)
Low blood pressure measurement noted, asymptomatic presently, reviewed Bps, noted no previous low Bp measurements. Will monitor Bp P bid x 7 days, then reevaluate.

## 2018-06-29 DIAGNOSIS — I4891 Unspecified atrial fibrillation: Secondary | ICD-10-CM | POA: Diagnosis not present

## 2018-06-29 DIAGNOSIS — R1310 Dysphagia, unspecified: Secondary | ICD-10-CM | POA: Diagnosis not present

## 2018-06-29 DIAGNOSIS — C7951 Secondary malignant neoplasm of bone: Secondary | ICD-10-CM | POA: Diagnosis not present

## 2018-06-29 DIAGNOSIS — G9340 Encephalopathy, unspecified: Secondary | ICD-10-CM | POA: Diagnosis not present

## 2018-06-29 DIAGNOSIS — C50919 Malignant neoplasm of unspecified site of unspecified female breast: Secondary | ICD-10-CM | POA: Diagnosis not present

## 2018-06-29 DIAGNOSIS — J449 Chronic obstructive pulmonary disease, unspecified: Secondary | ICD-10-CM | POA: Diagnosis not present

## 2018-07-04 DIAGNOSIS — C7951 Secondary malignant neoplasm of bone: Secondary | ICD-10-CM | POA: Diagnosis not present

## 2018-07-04 DIAGNOSIS — G9340 Encephalopathy, unspecified: Secondary | ICD-10-CM | POA: Diagnosis not present

## 2018-07-04 DIAGNOSIS — C50919 Malignant neoplasm of unspecified site of unspecified female breast: Secondary | ICD-10-CM | POA: Diagnosis not present

## 2018-07-04 DIAGNOSIS — R1310 Dysphagia, unspecified: Secondary | ICD-10-CM | POA: Diagnosis not present

## 2018-07-04 DIAGNOSIS — J449 Chronic obstructive pulmonary disease, unspecified: Secondary | ICD-10-CM | POA: Diagnosis not present

## 2018-07-04 DIAGNOSIS — I4891 Unspecified atrial fibrillation: Secondary | ICD-10-CM | POA: Diagnosis not present

## 2018-07-05 ENCOUNTER — Non-Acute Institutional Stay (SKILLED_NURSING_FACILITY): Payer: Medicare Other

## 2018-07-05 DIAGNOSIS — Z0000 Encounter for general adult medical examination without abnormal findings: Secondary | ICD-10-CM | POA: Diagnosis not present

## 2018-07-05 DIAGNOSIS — I4891 Unspecified atrial fibrillation: Secondary | ICD-10-CM | POA: Diagnosis not present

## 2018-07-05 DIAGNOSIS — R1310 Dysphagia, unspecified: Secondary | ICD-10-CM | POA: Diagnosis not present

## 2018-07-05 DIAGNOSIS — C50919 Malignant neoplasm of unspecified site of unspecified female breast: Secondary | ICD-10-CM | POA: Diagnosis not present

## 2018-07-05 DIAGNOSIS — C7951 Secondary malignant neoplasm of bone: Secondary | ICD-10-CM | POA: Diagnosis not present

## 2018-07-05 DIAGNOSIS — J449 Chronic obstructive pulmonary disease, unspecified: Secondary | ICD-10-CM | POA: Diagnosis not present

## 2018-07-05 DIAGNOSIS — G9340 Encephalopathy, unspecified: Secondary | ICD-10-CM | POA: Diagnosis not present

## 2018-07-05 NOTE — Patient Instructions (Addendum)
Lauren Buchanan , Thank you for taking time to come for your Medicare Wellness Visit. I appreciate your ongoing commitment to your health goals. Please review the following plan we discussed and let me know if I can assist you in the future.   Screening recommendations/referrals: Colonoscopy excluded, over age 82 Mammogram excluded, over age 20 Bone Density up to date Recommended yearly ophthalmology/optometry visit for glaucoma screening and checkup Recommended yearly dental visit for hygiene and checkup  Vaccinations: Influenza vaccine due, will receive at [facility] Pneumococcal vaccine up to date, completed Tdap vaccine due, patient is hospice-not ordered Shingles vaccine not in past records    Advanced directives: in chart  Conditions/risks identified: none  Next appointment: Dr. Bubba Camp makes rounds   Preventive Care 50 Years and Older, Female Preventive care refers to lifestyle choices and visits with your health care provider that can promote health and wellness. What does preventive care include?  A yearly physical exam. This is also called an annual well check.  Dental exams once or twice a year.  Routine eye exams. Ask your health care provider how often you should have your eyes checked.  Personal lifestyle choices, including:  Daily care of your teeth and gums.  Regular physical activity.  Eating a healthy diet.  Avoiding tobacco and drug use.  Limiting alcohol use.  Practicing safe sex.  Taking low-dose aspirin every day.  Taking vitamin and mineral supplements as recommended by your health care provider. What happens during an annual well check? The services and screenings done by your health care provider during your annual well check will depend on your age, overall health, lifestyle risk factors, and family history of disease. Counseling  Your health care provider may ask you questions about your:  Alcohol use.  Tobacco use.  Drug use.  Emotional  well-being.  Home and relationship well-being.  Sexual activity.  Eating habits.  History of falls.  Memory and ability to understand (cognition).  Work and work Statistician.  Reproductive health. Screening  You may have the following tests or measurements:  Height, weight, and BMI.  Blood pressure.  Lipid and cholesterol levels. These may be checked every 5 years, or more frequently if you are over 5 years old.  Skin check.  Lung cancer screening. You may have this screening every year starting at age 28 if you have a 30-pack-year history of smoking and currently smoke or have quit within the past 15 years.  Fecal occult blood test (FOBT) of the stool. You may have this test every year starting at age 87.  Flexible sigmoidoscopy or colonoscopy. You may have a sigmoidoscopy every 5 years or a colonoscopy every 10 years starting at age 39.  Hepatitis C blood test.  Hepatitis B blood test.  Sexually transmitted disease (STD) testing.  Diabetes screening. This is done by checking your blood sugar (glucose) after you have not eaten for a while (fasting). You may have this done every 1-3 years.  Bone density scan. This is done to screen for osteoporosis. You may have this done starting at age 49.  Mammogram. This may be done every 1-2 years. Talk to your health care provider about how often you should have regular mammograms. Talk with your health care provider about your test results, treatment options, and if necessary, the need for more tests. Vaccines  Your health care provider may recommend certain vaccines, such as:  Influenza vaccine. This is recommended every year.  Tetanus, diphtheria, and acellular pertussis (Tdap, Td) vaccine.  You may need a Td booster every 10 years.  Zoster vaccine. You may need this after age 22.  Pneumococcal 13-valent conjugate (PCV13) vaccine. One dose is recommended after age 76.  Pneumococcal polysaccharide (PPSV23) vaccine. One  dose is recommended after age 69. Talk to your health care provider about which screenings and vaccines you need and how often you need them. This information is not intended to replace advice given to you by your health care provider. Make sure you discuss any questions you have with your health care provider. Document Released: 11/01/2015 Document Revised: 06/24/2016 Document Reviewed: 08/06/2015 Elsevier Interactive Patient Education  2017 [place] Prevention in the Home Falls can cause injuries. They can happen to people of all ages. There are many things you can do to make your home safe and to help prevent falls. What can I do on the outside of my home?  Regularly fix the edges of walkways and driveways and fix any cracks.  Remove anything that might make you trip as you walk through a door, such as a raised step or threshold.  Trim any bushes or trees on the path to your home.  Use bright outdoor lighting.  Clear any walking paths of anything that might make someone trip, such as rocks or tools.  Regularly check to see if handrails are loose or broken. Make sure that both sides of any steps have handrails.  Any raised decks and porches should have guardrails on the edges.  Have any leaves, snow, or ice cleared regularly.  Use sand or salt on walking paths during winter.  Clean up any spills in your garage right away. This includes oil or grease spills. What can I do in the bathroom?  Use night lights.  Install grab bars by the toilet and in the tub and shower. Do not use towel bars as grab bars.  Use non-skid mats or decals in the tub or shower.  If you need to sit down in the shower, use a plastic, non-slip stool.  Keep the floor dry. Clean up any water that spills on the floor as soon as it happens.  Remove soap buildup in the tub or shower regularly.  Attach bath mats securely with double-sided non-slip rug tape.  Do not have throw rugs and other  things on the floor that can make you trip. What can I do in the bedroom?  Use night lights.  Make sure that you have a light by your bed that is easy to reach.  Do not use any sheets or blankets that are too big for your bed. They should not hang down onto the floor.  Have a firm chair that has side arms. You can use this for support while you get dressed.  Do not have throw rugs and other things on the floor that can make you trip. What can I do in the kitchen?  Clean up any spills right away.  Avoid walking on wet floors.  Keep items that you use a lot in easy-to-reach places.  If you need to reach something above you, use a strong step stool that has a grab bar.  Keep electrical cords out of the way.  Do not use floor polish or wax that makes floors slippery. If you must use wax, use non-skid floor wax.  Do not have throw rugs and other things on the floor that can make you trip. What can I do with my stairs?  Do not leave any  items on the stairs.  Make sure that there are handrails on both sides of the stairs and use them. Fix handrails that are broken or loose. Make sure that handrails are as long as the stairways.  Check any carpeting to make sure that it is firmly attached to the stairs. Fix any carpet that is loose or worn.  Avoid having throw rugs at the top or bottom of the stairs. If you do have throw rugs, attach them to the floor with carpet tape.  Make sure that you have a light switch at the top of the stairs and the bottom of the stairs. If you do not have them, ask someone to add them for you. What else can I do to help prevent falls?  Wear shoes that:  Do not have high heels.  Have rubber bottoms.  Are comfortable and fit you well.  Are closed at the toe. Do not wear sandals.  If you use a stepladder:  Make sure that it is fully opened. Do not climb a closed stepladder.  Make sure that both sides of the stepladder are locked into place.  Ask  someone to hold it for you, if possible.  Clearly mark and make sure that you can see:  Any grab bars or handrails.  First and last steps.  Where the edge of each step is.  Use tools that help you move around (mobility aids) if they are needed. These include:  Canes.  Walkers.  Scooters.  Crutches.  Turn on the lights when you go into a dark area. Replace any light bulbs as soon as they burn out.  Set up your furniture so you have a clear path. Avoid moving your furniture around.  If any of your floors are uneven, fix them.  If there are any pets around you, be aware of where they are.  Review your medicines with your doctor. Some medicines can make you feel dizzy. This can increase your chance of falling. Ask your doctor what other things that you can do to help prevent falls. This information is not intended to replace advice given to you by your health care provider. Make sure you discuss any questions you have with your health care provider. Document Released: 08/01/2009 Document Revised: 03/12/2016 Document Reviewed: 11/09/2014 Elsevier Interactive Patient Education  2017 [employer].

## 2018-07-05 NOTE — Progress Notes (Addendum)
Subjective:   Lauren Buchanan is a 84 y.o. female who presents for Medicare Annual (Subsequent) preventive examination at [facility], hospice patient  Last AWV-07/01/2017       Objective:     Vitals: BP 120/72 (BP Location: Left Arm, Patient Position: Supine)   Pulse 74   Temp 98 F (36.7 C) (Oral)   Ht 5\' 1"  (1.549 m)   Wt 100 lb (45.4 kg)   BMI 18.89 kg/m   Body mass index is 18.89 kg/m.  Advanced Directives 07/05/2018 06/27/2018 05/31/2018 04/28/2018 04/25/2018 03/30/2018 03/22/2018  Does Patient Have a Medical Advance Directive? Yes Yes Yes Yes Yes Yes Yes  Type of Paramedic of [place];Out of facility DNR (pink MOST or yellow form) [place];Out of facility DNR (pink MOST or yellow form) [place];Out of facility DNR (pink MOST or yellow form) [place];Out of facility DNR (pink MOST or yellow form) [place];Out of facility DNR (pink MOST or yellow form) [place];Out of facility DNR (pink MOST or yellow form) [place];Out of facility DNR (pink MOST or yellow form)  Does patient want to make changes to medical advance directive? No - Patient declined No - Patient declined No - Patient declined No - Patient declined No - Patient declined No - Patient declined No - Patient declined  Copy of [place] in Chart? Yes Yes Yes Yes Yes Yes Yes  Would patient like information on creating a medical advance directive? - - - - - - -  Pre-existing out of facility DNR order (yellow form or pink MOST form) Yellow form placed in chart (order not valid for inpatient use);Pink MOST form placed in chart (order not valid for inpatient use) Yellow form placed in chart (order not valid for inpatient use);Pink MOST form placed in chart (order not valid for inpatient use) Yellow form placed in chart (order not valid for inpatient  use);Pink MOST form placed in chart (order not valid for inpatient use) Yellow form placed in chart (order not valid for inpatient use);Pink MOST form placed in chart (order not valid for inpatient use) Yellow form placed in chart (order not valid for inpatient use) Yellow form placed in chart (order not valid for inpatient use) Yellow form placed in chart (order not valid for inpatient use)    Tobacco Social History   Tobacco Use  Smoking Status Never Smoker  Smokeless Tobacco Never Used     Counseling given: Not Answered   Clinical Intake:  Pre-visit preparation completed: No  Pain : No/denies pain     Nutritional Risks: None Diabetes: No  How often do you need to have someone help you when you read instructions, pamphlets, or other written materials from your doctor or pharmacy?: 3 - Sometimes  Interpreter Needed?: No  Information entered by :: Tyson Dense, RN  Past Medical History:  Diagnosis Date  . Asthma    "as a child"  . Breast cancer ([place]) 12/08/2014   Bilateral  . Cancer of central portion of female breast ([place]) 12/04/2014  . Cancer of central portion of female breast ([place]) 12/04/2014  . Complete heart block ([place]) 1999   s/p PPM by Dr Olevia Perches, most recent generator change 2009  . Complication of anesthesia   . COPD (chronic obstructive pulmonary disease) ([place])   . Dementia   . Diverticulosis   . DJD (degenerative joint disease)  RA  . Esophageal stricture   . Family history of adverse reaction to anesthesia    Daughter N/V  . GERD (gastroesophageal reflux disease)   . Giant cell arteritis ([place])   . Goiter   . Hemorrhoids   . Hiatal hernia   . IBS (irritable bowel syndrome)   . Paroxysmal atrial fibrillation ([place]) 12/17/2015   asymptomatic, <1%, determined on regular pacemaker interrogation  . Pneumonia   . PONV (postoperative nausea and vomiting)   . S/P radiation therapy 04/24/15 completed   T-11  . Wears glasses    Past Surgical History:    Procedure Laterality Date  . ANAL FISSURE REPAIR    . ARTERY BIOPSY Left 01/03/2013   Procedure: BIOPSY LEFT TEMPORAL ARTERY;  Surgeon: Ascencion Dike, MD;  Location: [facility];  Service: ENT;  Laterality: Left;  . BREAST BIOPSY Right   . CHOLECYSTECTOMY    . COLONOSCOPY    . FRACTURE SURGERY     fx lt wrist  . HEMORRHOID SURGERY    . INSERT / REPLACE / [place]   Gen change ([facility]) by Dr Olevia Perches 2009  . IR FLUORO GUIDE PORT INSERTION RIGHT  12/22/2017  . IR US GUIDE VASC ACCESS RIGHT  12/22/2017  . LUNG REMOVAL, PARTIAL  1950   left lower lobectomy for brochiectasis   . MASTECTOMY Left 02/08/2018  . MOUTH SURGERY     patient unaware  . RECTAL POLYPECTOMY    . RENAL CYST EXCISION    . TONSILLECTOMY    . TOTAL MASTECTOMY Left 02/08/2018  . TOTAL MASTECTOMY Left 02/08/2018   Procedure: TOTAL MASTECTOMY LEFT;  Surgeon: Fanny Skates, MD;  Location: [facility];  Service: General;  Laterality: Left;   Family History  Problem Relation Age of Onset  . Heart disease Mother   . Breast cancer Mother   . Stomach cancer Father   . Alcohol abuse Father   . Seizures Son   . Thyroid disease Sister    Social History   Socioeconomic History  . Marital status: Married    Spouse name: Not on file  . Number of children: 4  . Years of education: Not on file  . Highest education level: Not on file  Occupational History  . Occupation: Retired  Scientific laboratory technician  . Financial resource strain: Not hard at all  . Food insecurity:    Worry: Never true    Inability: Never true  . Transportation needs:    Medical: No    Non-medical: No  Tobacco Use  . Smoking status: Never Smoker  . Smokeless tobacco: Never Used  Substance and Sexual Activity  . Alcohol use: Never    Alcohol/week: 0.0 standard drinks    Frequency: Never    Comment: Occasional   . Drug use: No  . Sexual activity: Not Currently  Lifestyle  . Physical activity:    Days per week: 0 days    Minutes per  session: 0 min  . Stress: Only a little  Relationships  . Social connections:    Talks on phone: Not on file    Gets together: Not on file    Attends religious service: Never    Active member of club or organization: No    Attends meetings of clubs or organizations: Never    Relationship status: Married  Other Topics Concern  . Not on file  Social History Narrative   Social History     Marital status: Widowed  Spouse name:                        Years of education:                 Number of children: 4           Occupational History   Occupation: Copywriter, advertising                Retired : Yes                                      Social History Main Topics     Smoking status: Never Smoker                                                                  Smokeless tobacco: Never Used                         Alcohol use:         0.0 oz/week        Comment:      Drug use: No               Sexual activity:        Other Topics            Concern     None on file      Social History Narrative     Do you drink/eat things with caffeine?    Yes     Marital status: Widowed.  What year were you married? 1948     Do you have a living will? Yes     Do you have DNR form? Yes     Do you have signed POA/HPOA forms? Yes             Outpatient Encounter Medications as of 07/05/2018  Medication Sig  . feeding supplement (BOOST / RESOURCE BREEZE) LIQD Take 1 Container by mouth daily.   Marland Kitchen LORazepam (ATIVAN) 0.5 MG tablet Take 0.5 mg by mouth every 6 (six) hours as needed for anxiety.   Marland Kitchen LORazepam (ATIVAN) 0.5 MG tablet Take 0.5 mg by mouth 2 (two) times daily. Take twice daily at 8:00am  and 5pm  . magnesium hydroxide (MILK OF MAGNESIA) 400 MG/5ML suspension Take 30 mLs by mouth daily as needed for mild constipation.  . metoprolol succinate (TOPROL-XL) 50 MG 24 hr tablet Take 50 mg by mouth daily. Take with or immediately following a meal.  . mirtazapine  (REMERON) 15 MG tablet Take 15 mg by mouth at bedtime.   . ondansetron (ZOFRAN) 4 MG tablet Take 4 mg by mouth 3 (three) times daily.   Marland Kitchen oxycodone (OXY-IR) 5 MG capsule Take 5 mg by mouth every 6 (six) hours as needed.  . predniSONE (DELTASONE) 5 MG tablet Take 5 mg by mouth daily with breakfast.  . QUEtiapine (SEROQUEL) 25 MG tablet Take 25 mg by mouth at bedtime.  . sennosides-docusate sodium (SENOKOT-S) 8.6-50 MG tablet Take 1  tablet by mouth at bedtime.  . traMADol (ULTRAM) 50 MG tablet Take 50 mg by mouth every 6 (six) hours as needed (FOR PAIN).    No facility-administered encounter medications on file as of 07/05/2018.     Activities of Daily Living In your present state of health, do you have any difficulty performing the following activities: 07/05/2018 03/30/2018  Hearing? N N  Vision? N N  Difficulty concentrating or making decisions? Y Y  Comment - -  Walking or climbing stairs? N Y  Dressing or bathing? N Y  Doing errands, shopping? [place] and eating ? Y -  Using the Toilet? N -  In the past six months, have you accidently leaked urine? N -  Do you have problems with loss of bowel control? N -  Managing your Medications? Y -  Managing your Finances? Y -  Housekeeping or managing your Housekeeping? Y -  Some recent data might be hidden    Patient Care Team: Blanchie Serve, MD as PCP - General (Internal Medicine) Fanny Skates, MD as Consulting Physician (General Surgery) [name], [name], MD as Consulting Physician (Oncology) [name], MD as Consulting Physician (Radiation Oncology) Rockwell Germany, RN as Registered Nurse Mauro Kaufmann, RN as Registered Nurse Gentry Fitz, MD as Consulting Physician (Family Medicine) [name], [name], NP as Nurse Practitioner (Internal Medicine) Thompson Grayer, MD as Consulting Physician (Cardiology)    Assessment:   This is a routine wellness examination for Janan.  Exercise Activities and  Dietary recommendations Current Exercise Habits: The patient does not participate in regular exercise at present, Exercise limited by: neurologic condition(s)  Goals   None     Fall Risk Fall Risk  07/05/2018 07/01/2017 02/22/2017 05/22/2015 03/22/2015  Falls in the past year? No No No No No   Is the patient's home free of loose throw rugs in walkways, pet beds, electrical cords, etc?   yes      Grab bars in the bathroom? yes      Handrails on the stairs?   yes      Adequate lighting?   yes  Depression Screen PHQ 2/9 Scores 07/05/2018 07/01/2017 08/02/2014  PHQ - 2 Score 1 0 0     Cognitive Function MMSE - Mini Mental State Exam 07/05/2018 02/22/2017 02/22/2017  Orientation to time 0 1 1  Orientation to Place 2 3 3   Registration 3 3 3   Attention/ Calculation 0 3 3  Recall 0 0 0  Language- name 2 objects 2 2 2   Language- repeat 1 1 1   Language- follow 3 step command 3 3 3   Language- read & follow direction 1 1 1   Write a sentence 1 1 1   Copy design 1 1 1   Total score 14 19 19         Immunization History  Administered Date(s) Administered  . Influenza Split 07/24/2011, 06/20/2012  . Influenza Whole 10/19/2001, 07/25/2007, 08/02/2008, 07/18/2009, 07/10/2010  . Influenza,inj,Quad PF,6+ Mos 07/18/2013, 08/27/2014  . Influenza-Unspecified 07/31/2015, 08/19/2016, 08/09/2017  . Pneumococcal Conjugate-13 08/16/2017  . Pneumococcal Polysaccharide-23 10/19/1997, 01/17/2010  . Td 10/19/1996, 07/25/2007    Qualifies for Shingles Vaccine? Not in past records  Screening Tests Health Maintenance  Topic Date Due  . TETANUS/TDAP  07/24/2017  . INFLUENZA VACCINE  05/19/2018  . DEXA SCAN  Completed  . PNA vac Low Risk Adult  Completed    Cancer Screenings: Lung: Low Dose CT Chest recommended if  Age 86-80 years, 89 pack-year currently smoking OR have quit w/in 15years. Patient does not qualify. Breast:  Up to date on Mammogram? Yes   Up to date of Bone Density/Dexa? Yes Colorectal: up  to date  Additional Screenings:  Hepatitis C Screening: declined Flu vaccine due: will receive at [facility] TDAP due: patient is hospice, not ordered     Plan:    I have personally reviewed and addressed the Medicare Annual Wellness questionnaire and have noted the following in the patient's chart:  A. Medical and social history B. Use of alcohol, tobacco or illicit drugs  C. Current medications and supplements D. Functional ability and status E.  Nutritional status F.  Physical activity G. Advance directives H. List of other physicians I.  Hospitalizations, surgeries, and ER visits in previous 12 months J.  [place] to include hearing, vision, cognitive, depression L. Referrals and appointments - none  In addition, I have reviewed and discussed with patient certain preventive protocols, quality metrics, and best practice recommendations. A written personalized care plan for preventive services as well as general preventive health recommendations were provided to patient.  See attached scanned questionnaire for additional information.   Signed,   Tyson Dense, RN Nurse Health Advisor  Patient Concerns: None

## 2018-07-15 DIAGNOSIS — I4891 Unspecified atrial fibrillation: Secondary | ICD-10-CM | POA: Diagnosis not present

## 2018-07-15 DIAGNOSIS — C50919 Malignant neoplasm of unspecified site of unspecified female breast: Secondary | ICD-10-CM | POA: Diagnosis not present

## 2018-07-15 DIAGNOSIS — G9340 Encephalopathy, unspecified: Secondary | ICD-10-CM | POA: Diagnosis not present

## 2018-07-15 DIAGNOSIS — C7951 Secondary malignant neoplasm of bone: Secondary | ICD-10-CM | POA: Diagnosis not present

## 2018-07-15 DIAGNOSIS — R1310 Dysphagia, unspecified: Secondary | ICD-10-CM | POA: Diagnosis not present

## 2018-07-15 DIAGNOSIS — J449 Chronic obstructive pulmonary disease, unspecified: Secondary | ICD-10-CM | POA: Diagnosis not present

## 2018-07-18 DIAGNOSIS — J449 Chronic obstructive pulmonary disease, unspecified: Secondary | ICD-10-CM | POA: Diagnosis not present

## 2018-07-18 DIAGNOSIS — R1310 Dysphagia, unspecified: Secondary | ICD-10-CM | POA: Diagnosis not present

## 2018-07-18 DIAGNOSIS — C50919 Malignant neoplasm of unspecified site of unspecified female breast: Secondary | ICD-10-CM | POA: Diagnosis not present

## 2018-07-18 DIAGNOSIS — I4891 Unspecified atrial fibrillation: Secondary | ICD-10-CM | POA: Diagnosis not present

## 2018-07-18 DIAGNOSIS — G9340 Encephalopathy, unspecified: Secondary | ICD-10-CM | POA: Diagnosis not present

## 2018-07-18 DIAGNOSIS — C7951 Secondary malignant neoplasm of bone: Secondary | ICD-10-CM | POA: Diagnosis not present

## 2018-07-19 ENCOUNTER — Encounter: Payer: Self-pay | Admitting: Nurse Practitioner

## 2018-07-19 ENCOUNTER — Non-Acute Institutional Stay (SKILLED_NURSING_FACILITY): Payer: Medicare Other | Admitting: Nurse Practitioner

## 2018-07-19 DIAGNOSIS — M159 Polyosteoarthritis, unspecified: Secondary | ICD-10-CM | POA: Diagnosis not present

## 2018-07-19 DIAGNOSIS — K5901 Slow transit constipation: Secondary | ICD-10-CM

## 2018-07-19 DIAGNOSIS — J301 Allergic rhinitis due to pollen: Secondary | ICD-10-CM | POA: Diagnosis not present

## 2018-07-19 DIAGNOSIS — N183 Chronic kidney disease, stage 3 (moderate): Secondary | ICD-10-CM | POA: Diagnosis not present

## 2018-07-19 DIAGNOSIS — K589 Irritable bowel syndrome without diarrhea: Secondary | ICD-10-CM | POA: Diagnosis not present

## 2018-07-19 DIAGNOSIS — F329 Major depressive disorder, single episode, unspecified: Secondary | ICD-10-CM

## 2018-07-19 DIAGNOSIS — F419 Anxiety disorder, unspecified: Secondary | ICD-10-CM

## 2018-07-19 DIAGNOSIS — M316 Other giant cell arteritis: Secondary | ICD-10-CM | POA: Diagnosis not present

## 2018-07-19 DIAGNOSIS — I10 Essential (primary) hypertension: Secondary | ICD-10-CM

## 2018-07-19 DIAGNOSIS — R1310 Dysphagia, unspecified: Secondary | ICD-10-CM | POA: Diagnosis not present

## 2018-07-19 DIAGNOSIS — K219 Gastro-esophageal reflux disease without esophagitis: Secondary | ICD-10-CM | POA: Diagnosis not present

## 2018-07-19 DIAGNOSIS — C50919 Malignant neoplasm of unspecified site of unspecified female breast: Secondary | ICD-10-CM | POA: Diagnosis not present

## 2018-07-19 DIAGNOSIS — C7951 Secondary malignant neoplasm of bone: Secondary | ICD-10-CM | POA: Diagnosis not present

## 2018-07-19 DIAGNOSIS — I4891 Unspecified atrial fibrillation: Secondary | ICD-10-CM | POA: Diagnosis not present

## 2018-07-19 DIAGNOSIS — J449 Chronic obstructive pulmonary disease, unspecified: Secondary | ICD-10-CM | POA: Diagnosis not present

## 2018-07-19 DIAGNOSIS — G9340 Encephalopathy, unspecified: Secondary | ICD-10-CM | POA: Diagnosis not present

## 2018-07-19 DIAGNOSIS — D630 Anemia in neoplastic disease: Secondary | ICD-10-CM | POA: Diagnosis not present

## 2018-07-19 NOTE — Assessment & Plan Note (Signed)
Heart rate is in control, continue Metoprolol.

## 2018-07-19 NOTE — Assessment & Plan Note (Signed)
Stable, continue Senokot S I qhs, prn MOM 30mg  daily.

## 2018-07-19 NOTE — Assessment & Plan Note (Signed)
Blood pressure is in control, continue Metoprolol 50mg  qd.

## 2018-07-19 NOTE — Progress Notes (Signed)
Location:  [facility] Room Number: 106 Place of Service:  SNF (31) Provider:  Sunya Humbarger, Lennie Odor  NP  Vinal Rosengrant X, NP  Patient Care Team: Lukasz Rogus X, NP as PCP - General (Internal Medicine) Fanny Skates, MD as Consulting Physician (General Surgery) [name], [name], MD as Consulting Physician (Oncology) [name], MD as Consulting Physician (Radiation Oncology) Rockwell Germany, RN as Registered Nurse Mauro Kaufmann, RN as Registered Nurse Gentry Fitz, MD as Consulting Physician (Family Medicine) Benetta Maclaren X, NP as Nurse Practitioner (Internal Medicine) Thompson Grayer, MD as Consulting Physician (Cardiology)  Extended Emergency Contact Information Primary Emergency Contact: Matthews,[name] Address: [address]          [place], [place] [address] [place] Phone: [phone] Relation: Daughter Secondary Emergency Contact: Clent Demark [place] Phone: [phone] Relation: Other  Code Status:DNR Goals of care: Advanced Directive information Advanced Directives 07/19/2018  Does Patient Have a Medical Advance Directive? Yes  Type of Paramedic of [place];Out of facility DNR (pink MOST or yellow form)  Does patient want to make changes to medical advance directive? No - Patient declined  Copy of [place] in Chart? Yes  Would patient like information on creating a medical advance directive? -  Pre-existing out of facility DNR order (yellow form or pink MOST form) Yellow form placed in chart (order not valid for inpatient use);Pink MOST form placed in chart (order not valid for inpatient use)     Chief Complaint  Patient presents with  . Medical Management of Chronic Issues    F/u dementia, COPD, HTN,     HPI:  Pt is a 84 y.o. female seen today for medical management of chronic diseases.    The patient resides in memory care unit [facility] for safety and  care assistance, her goal of care is comfort measures, under Hospice service, on prn Tramadol 50mg  q6r, Oxycodone 5mg  q6h prn. Her mood is stabilized on Quetiapine 25mg  qd, Mirtazapine 15mg  qd, Lorazepam 0.5mg  bid. Hx of HTN, blood pressure is controlled on Metoprolol 50mg  qd. No constipation whil eon Senokot S I qhs and prn MOM 30mg . Chronic Prednisone 5mg  qd for temporal arteritis.   Past Medical History:  Diagnosis Date  . Asthma    "as a child"  . Breast cancer ([place]) 12/08/2014   Bilateral  . Cancer of central portion of female breast ([place]) 12/04/2014  . Cancer of central portion of female breast ([place]) 12/04/2014  . Complete heart block ([place]) 1999   s/p PPM by Dr Olevia Perches, most recent generator change 2009  . Complication of anesthesia   . COPD (chronic obstructive pulmonary disease) ([place])   . Dementia ([place])   . Diverticulosis   . DJD (degenerative joint disease)    RA  . Esophageal stricture   . Family history of adverse reaction to anesthesia    Daughter N/V  . GERD (gastroesophageal reflux disease)   . Giant cell arteritis ([place])   . Goiter   . Hemorrhoids   . Hiatal hernia   . IBS (irritable bowel syndrome)   . Paroxysmal atrial fibrillation ([place]) 12/17/2015   asymptomatic, <1%, determined on regular pacemaker interrogation  . Pneumonia   . PONV (postoperative nausea and vomiting)   . S/P radiation therapy 04/24/15 completed   T-11  . Wears glasses    Past Surgical History:  Procedure Laterality Date  . ANAL FISSURE REPAIR    .  ARTERY BIOPSY Left 01/03/2013   Procedure: BIOPSY LEFT TEMPORAL ARTERY;  Surgeon: Ascencion Dike, MD;  Location: [facility];  Service: ENT;  Laterality: Left;  . BREAST BIOPSY Right   . CHOLECYSTECTOMY    . COLONOSCOPY    . FRACTURE SURGERY     fx lt wrist  . HEMORRHOID SURGERY    . INSERT / REPLACE / [place]   Gen change ([facility]) by Dr Olevia Perches 2009  . IR FLUORO GUIDE PORT INSERTION RIGHT  12/22/2017  . IR US GUIDE VASC  ACCESS RIGHT  12/22/2017  . LUNG REMOVAL, PARTIAL  1950   left lower lobectomy for brochiectasis   . MASTECTOMY Left 02/08/2018  . MOUTH SURGERY     patient unaware  . RECTAL POLYPECTOMY    . RENAL CYST EXCISION    . TONSILLECTOMY    . TOTAL MASTECTOMY Left 02/08/2018  . TOTAL MASTECTOMY Left 02/08/2018   Procedure: TOTAL MASTECTOMY LEFT;  Surgeon: Fanny Skates, MD;  Location: [facility];  Service: General;  Laterality: Left;    Allergies  Allergen Reactions  . Amoxicillin-Pot Clavulanate Other (See Comments)    Upset stomach Has patient had a PCN reaction causing immediate rash, facial/tongue/throat swelling, SOB or lightheadedness with hypotension: No Has patient had a PCN reaction causing severe rash involving mucus membranes or skin necrosis: No Has patient had a PCN reaction that required hospitalization: No Has patient had a PCN reaction occurring within the last 10 years: No If all of the above answers are "NO", then may proceed with Cephalosporin use.   Marland Kitchen Morphine And Related Nausea Only    Outpatient Encounter Medications as of 07/19/2018  Medication Sig  . feeding supplement (BOOST / RESOURCE BREEZE) LIQD Take 1 Container by mouth daily.   Marland Kitchen LORazepam (ATIVAN) 0.5 MG tablet Take 0.5 mg by mouth every 6 (six) hours as needed for anxiety.   Marland Kitchen LORazepam (ATIVAN) 0.5 MG tablet Take 0.5 mg by mouth 2 (two) times daily. Take twice daily at 8:00am  and 5pm  . magnesium hydroxide (MILK OF MAGNESIA) 400 MG/5ML suspension Take 30 mLs by mouth daily as needed for mild constipation.  . metoprolol succinate (TOPROL-XL) 50 MG 24 hr tablet Take 50 mg by mouth daily. Take with or immediately following a meal.  . mirtazapine (REMERON) 15 MG tablet Take 15 mg by mouth at bedtime.   . ondansetron (ZOFRAN) 4 MG tablet Take 4 mg by mouth 3 (three) times daily.   Marland Kitchen oxycodone (OXY-IR) 5 MG capsule Take 5 mg by mouth every 6 (six) hours as needed.  . predniSONE (DELTASONE) 5 MG tablet Take 5 mg by  mouth daily with breakfast.  . QUEtiapine (SEROQUEL) 25 MG tablet Take 25 mg by mouth at bedtime.  . sennosides-docusate sodium (SENOKOT-S) 8.6-50 MG tablet Take 1 tablet by mouth at bedtime.  . traMADol (ULTRAM) 50 MG tablet Take 50 mg by mouth every 6 (six) hours as needed (FOR PAIN).    No facility-administered encounter medications on file as of 07/19/2018.    ROS was provided with assistance of staff Review of Systems  Constitutional: Negative for activity change, appetite change, chills, diaphoresis, fatigue and fever.  HENT: Positive for hearing loss. Negative for congestion and voice change.   Respiratory: Negative for cough, shortness of breath and wheezing.        DOE  Cardiovascular: Negative for chest pain, palpitations and leg swelling.  Gastrointestinal: Negative for abdominal distention, abdominal pain, constipation,  diarrhea, nausea and vomiting.  Genitourinary: Negative for difficulty urinating, dysuria and urgency.  Musculoskeletal: Positive for arthralgias and gait problem.  Skin: Negative for color change and pallor.  Neurological: Negative for dizziness, speech difficulty, weakness and headaches.       Memory lapses.   Psychiatric/Behavioral: Positive for confusion. Negative for agitation, behavioral problems and sleep disturbance. The patient is not nervous/anxious.     Immunization History  Administered Date(s) Administered  . Influenza Split 07/24/2011, 06/20/2012  . Influenza Whole 10/19/2001, 07/25/2007, 08/02/2008, 07/18/2009, 07/10/2010  . Influenza,inj,Quad PF,6+ Mos 07/18/2013, 08/27/2014  . Influenza-Unspecified 07/31/2015, 08/19/2016, 08/09/2017  . Pneumococcal Conjugate-13 08/16/2017  . Pneumococcal Polysaccharide-23 10/19/1997, 01/17/2010  . Td 10/19/1996, 07/25/2007   Pertinent  Health Maintenance Due  Topic Date Due  . INFLUENZA VACCINE  05/19/2018  . DEXA SCAN  Completed  . PNA vac Low Risk Adult  Completed   Fall Risk  07/05/2018 07/01/2017  02/22/2017 05/22/2015 03/22/2015  Falls in the past year? No No No No No   Functional Status Survey:    Vitals:   07/19/18 1204  BP: 118/68  Pulse: 76  Resp: 18  Temp: 98.2 F (36.8 C)  SpO2: 96%  Weight: 100 lb 12.8 oz (45.7 kg)  Height: 5\' 1"  (1.549 m)   Body mass index is 19.05 kg/m. Physical Exam  Constitutional: She appears well-developed and well-nourished.  HENT:  Head: Normocephalic and atraumatic.  Eyes: Pupils are equal, round, and reactive to light. EOM are normal.  Neck: Normal range of motion. Neck supple. No JVD present. No thyromegaly present.  Cardiovascular: Normal rate and regular rhythm.  Murmur heard. Pacemaker.   Pulmonary/Chest: Effort normal. She has no wheezes. She has no rales.  Abdominal: Soft. She exhibits no distension. There is no tenderness. There is no rebound and no guarding.  Musculoskeletal: She exhibits no edema.  Self transfers. Ambulates with walker.   Neurological: She is alert. No cranial nerve deficit. She exhibits normal muscle tone. Coordination normal.  Oriented to person and her room on unit.   Skin: Skin is warm and dry.  Psychiatric: She has a normal mood and affect. Her behavior is normal.    Labs reviewed: Recent Labs    03/30/18 1327 03/31/18 0406 04/01/18 0349 04/26/18 05/02/18 06/09/18  NA 128* 134* 135 132* 136* 141  K 3.0* 4.9 3.8 4.0 4.6 3.9  CL 93* 102 107 95 99 104  CO2 26 20* 23 30 32 32  GLUCOSE 107* 153* 102*  --   --   --   BUN 5* 6 9 15 10 14   CREATININE 0.64 0.76 0.64 0.8 0.8 0.7  CALCIUM 8.4* 8.5* 7.7* 8.0 8.9 8.4  MG  --  1.8  --   --   --   --    Recent Labs    02/17/18 03/04/18 1034  03/30/18 1327 04/26/18 06/09/18  AST 37 37*   < > 46* 28 23  ALT 32 35   < > 34 20 17  ALKPHOS 71 92   < > 69 70 50  BILITOT 0.7 0.6  --  0.7  --   --   PROT  --  6.6  --  6.1* 5.1 5.0  ALBUMIN 3.7 3.3*   < > 3.0* 3.0 2.9   < > = values in this interval not displayed.   Recent Labs    02/03/18 1344   03/04/18 1034  03/30/18 1327 03/31/18 0406 04/01/18 0349 04/26/18 06/09/18  WBC 7.1   < >  5.8   < > 8.8 8.2 9.5 10.0 6.6  NEUTROABS 6.0  --  4.0  --  6.1  --   --   --   --   HGB 12.6   < > 12.1   < > 12.3 12.5 10.8* 11.4* 11.6*  HCT 39.5   < > 37.1   < > 36.4 37.2 32.6* 34* 35*  MCV 89.4   < > 85.9  --  81.8 81.6 83.0  --   --   PLT 225   < > 198   < > 188 247 223 284 304   < > = values in this interval not displayed.   Lab Results  Component Value Date   TSH 0.719 03/31/2018   Lab Results  Component Value Date   HGBA1C 6.1 (H) 02/09/2018   Lab Results  Component Value Date   CHOL 194 07/25/2007   HDL 62.7 07/25/2007   LDLCALC 118 (H) 07/25/2007   TRIG 69 07/25/2007   CHOLHDL 3.1 CALC 07/25/2007    Significant Diagnostic Results in last 30 days:  No results found.  Assessment/Plan Essential hypertension Blood pressure is in control, continue Metoprolol 50mg  qd.   Atrial fibrillation, new onset (HCC) Heart rate is in control, continue Metoprolol.   Temporal arteritis Stable, continue Prednisone 5mg  qd.   Slow transit constipation Stable, continue Senokot S I qhs, prn MOM 30mg  daily.   Generalized osteoarthritis of multiple sites Continue to ambulate with walker, continue prn Tramadol and Oxycodone for comfort measures.   Anxiety and depression Her mood is stabilizing, continue Quetiapine 25mg  qd, Lorazepam 0.5mg  bid, Mirtazapine 15mg  qd, observe.      Family/ staff Communication: plan of care reviewed with the patient and charge nurse.   Labs/tests ordered: none   Time spend 25 minutes.

## 2018-07-19 NOTE — Assessment & Plan Note (Signed)
Continue to ambulate with walker, continue prn Tramadol and Oxycodone for comfort measures.

## 2018-07-19 NOTE — Assessment & Plan Note (Signed)
Stable, continue Prednisone 5mg  qd.

## 2018-07-19 NOTE — Assessment & Plan Note (Signed)
Her mood is stabilizing, continue Quetiapine 25mg  qd, Lorazepam 0.5mg  bid, Mirtazapine 15mg  qd, observe.

## 2018-07-26 DIAGNOSIS — L821 Other seborrheic keratosis: Secondary | ICD-10-CM | POA: Diagnosis not present

## 2018-07-26 DIAGNOSIS — L570 Actinic keratosis: Secondary | ICD-10-CM | POA: Diagnosis not present

## 2018-07-26 DIAGNOSIS — L814 Other melanin hyperpigmentation: Secondary | ICD-10-CM | POA: Diagnosis not present

## 2018-07-26 DIAGNOSIS — H61039 Chondritis of external ear, unspecified ear: Secondary | ICD-10-CM | POA: Diagnosis not present

## 2018-07-26 DIAGNOSIS — D2239 Melanocytic nevi of other parts of face: Secondary | ICD-10-CM | POA: Diagnosis not present

## 2018-07-28 DIAGNOSIS — C50919 Malignant neoplasm of unspecified site of unspecified female breast: Secondary | ICD-10-CM | POA: Diagnosis not present

## 2018-07-28 DIAGNOSIS — I4891 Unspecified atrial fibrillation: Secondary | ICD-10-CM | POA: Diagnosis not present

## 2018-07-28 DIAGNOSIS — C7951 Secondary malignant neoplasm of bone: Secondary | ICD-10-CM | POA: Diagnosis not present

## 2018-07-28 DIAGNOSIS — J449 Chronic obstructive pulmonary disease, unspecified: Secondary | ICD-10-CM | POA: Diagnosis not present

## 2018-07-28 DIAGNOSIS — G9340 Encephalopathy, unspecified: Secondary | ICD-10-CM | POA: Diagnosis not present

## 2018-07-28 DIAGNOSIS — R1310 Dysphagia, unspecified: Secondary | ICD-10-CM | POA: Diagnosis not present

## 2018-07-29 DIAGNOSIS — R1310 Dysphagia, unspecified: Secondary | ICD-10-CM | POA: Diagnosis not present

## 2018-07-29 DIAGNOSIS — I4891 Unspecified atrial fibrillation: Secondary | ICD-10-CM | POA: Diagnosis not present

## 2018-07-29 DIAGNOSIS — C7951 Secondary malignant neoplasm of bone: Secondary | ICD-10-CM | POA: Diagnosis not present

## 2018-07-29 DIAGNOSIS — G9340 Encephalopathy, unspecified: Secondary | ICD-10-CM | POA: Diagnosis not present

## 2018-07-29 DIAGNOSIS — J449 Chronic obstructive pulmonary disease, unspecified: Secondary | ICD-10-CM | POA: Diagnosis not present

## 2018-07-29 DIAGNOSIS — C50919 Malignant neoplasm of unspecified site of unspecified female breast: Secondary | ICD-10-CM | POA: Diagnosis not present

## 2018-08-03 DIAGNOSIS — I4891 Unspecified atrial fibrillation: Secondary | ICD-10-CM | POA: Diagnosis not present

## 2018-08-03 DIAGNOSIS — R1310 Dysphagia, unspecified: Secondary | ICD-10-CM | POA: Diagnosis not present

## 2018-08-03 DIAGNOSIS — J449 Chronic obstructive pulmonary disease, unspecified: Secondary | ICD-10-CM | POA: Diagnosis not present

## 2018-08-03 DIAGNOSIS — G9340 Encephalopathy, unspecified: Secondary | ICD-10-CM | POA: Diagnosis not present

## 2018-08-03 DIAGNOSIS — C50919 Malignant neoplasm of unspecified site of unspecified female breast: Secondary | ICD-10-CM | POA: Diagnosis not present

## 2018-08-03 DIAGNOSIS — C7951 Secondary malignant neoplasm of bone: Secondary | ICD-10-CM | POA: Diagnosis not present

## 2018-08-11 DIAGNOSIS — I4891 Unspecified atrial fibrillation: Secondary | ICD-10-CM | POA: Diagnosis not present

## 2018-08-11 DIAGNOSIS — R1310 Dysphagia, unspecified: Secondary | ICD-10-CM | POA: Diagnosis not present

## 2018-08-11 DIAGNOSIS — C50919 Malignant neoplasm of unspecified site of unspecified female breast: Secondary | ICD-10-CM | POA: Diagnosis not present

## 2018-08-11 DIAGNOSIS — G9340 Encephalopathy, unspecified: Secondary | ICD-10-CM | POA: Diagnosis not present

## 2018-08-11 DIAGNOSIS — J449 Chronic obstructive pulmonary disease, unspecified: Secondary | ICD-10-CM | POA: Diagnosis not present

## 2018-08-11 DIAGNOSIS — C7951 Secondary malignant neoplasm of bone: Secondary | ICD-10-CM | POA: Diagnosis not present

## 2018-08-12 DIAGNOSIS — I4891 Unspecified atrial fibrillation: Secondary | ICD-10-CM | POA: Diagnosis not present

## 2018-08-12 DIAGNOSIS — C50919 Malignant neoplasm of unspecified site of unspecified female breast: Secondary | ICD-10-CM | POA: Diagnosis not present

## 2018-08-12 DIAGNOSIS — G9340 Encephalopathy, unspecified: Secondary | ICD-10-CM | POA: Diagnosis not present

## 2018-08-12 DIAGNOSIS — R1310 Dysphagia, unspecified: Secondary | ICD-10-CM | POA: Diagnosis not present

## 2018-08-12 DIAGNOSIS — J449 Chronic obstructive pulmonary disease, unspecified: Secondary | ICD-10-CM | POA: Diagnosis not present

## 2018-08-12 DIAGNOSIS — C7951 Secondary malignant neoplasm of bone: Secondary | ICD-10-CM | POA: Diagnosis not present

## 2018-08-15 DIAGNOSIS — C7951 Secondary malignant neoplasm of bone: Secondary | ICD-10-CM | POA: Diagnosis not present

## 2018-08-15 DIAGNOSIS — C50919 Malignant neoplasm of unspecified site of unspecified female breast: Secondary | ICD-10-CM | POA: Diagnosis not present

## 2018-08-15 DIAGNOSIS — G9340 Encephalopathy, unspecified: Secondary | ICD-10-CM | POA: Diagnosis not present

## 2018-08-15 DIAGNOSIS — I4891 Unspecified atrial fibrillation: Secondary | ICD-10-CM | POA: Diagnosis not present

## 2018-08-15 DIAGNOSIS — R1310 Dysphagia, unspecified: Secondary | ICD-10-CM | POA: Diagnosis not present

## 2018-08-15 DIAGNOSIS — J449 Chronic obstructive pulmonary disease, unspecified: Secondary | ICD-10-CM | POA: Diagnosis not present

## 2018-08-19 DIAGNOSIS — K219 Gastro-esophageal reflux disease without esophagitis: Secondary | ICD-10-CM | POA: Diagnosis not present

## 2018-08-19 DIAGNOSIS — C50919 Malignant neoplasm of unspecified site of unspecified female breast: Secondary | ICD-10-CM | POA: Diagnosis not present

## 2018-08-19 DIAGNOSIS — D630 Anemia in neoplastic disease: Secondary | ICD-10-CM | POA: Diagnosis not present

## 2018-08-19 DIAGNOSIS — J301 Allergic rhinitis due to pollen: Secondary | ICD-10-CM | POA: Diagnosis not present

## 2018-08-19 DIAGNOSIS — C7951 Secondary malignant neoplasm of bone: Secondary | ICD-10-CM | POA: Diagnosis not present

## 2018-08-19 DIAGNOSIS — N183 Chronic kidney disease, stage 3 (moderate): Secondary | ICD-10-CM | POA: Diagnosis not present

## 2018-08-19 DIAGNOSIS — J449 Chronic obstructive pulmonary disease, unspecified: Secondary | ICD-10-CM | POA: Diagnosis not present

## 2018-08-19 DIAGNOSIS — R1310 Dysphagia, unspecified: Secondary | ICD-10-CM | POA: Diagnosis not present

## 2018-08-19 DIAGNOSIS — G9340 Encephalopathy, unspecified: Secondary | ICD-10-CM | POA: Diagnosis not present

## 2018-08-19 DIAGNOSIS — I4891 Unspecified atrial fibrillation: Secondary | ICD-10-CM | POA: Diagnosis not present

## 2018-08-19 DIAGNOSIS — K589 Irritable bowel syndrome without diarrhea: Secondary | ICD-10-CM | POA: Diagnosis not present

## 2018-08-22 ENCOUNTER — Encounter: Payer: Self-pay | Admitting: Nurse Practitioner

## 2018-08-22 ENCOUNTER — Non-Acute Institutional Stay (SKILLED_NURSING_FACILITY): Payer: Medicare Other | Admitting: Nurse Practitioner

## 2018-08-22 DIAGNOSIS — I4891 Unspecified atrial fibrillation: Secondary | ICD-10-CM | POA: Diagnosis not present

## 2018-08-22 DIAGNOSIS — M159 Polyosteoarthritis, unspecified: Secondary | ICD-10-CM | POA: Diagnosis not present

## 2018-08-22 DIAGNOSIS — F419 Anxiety disorder, unspecified: Secondary | ICD-10-CM | POA: Diagnosis not present

## 2018-08-22 DIAGNOSIS — F329 Major depressive disorder, single episode, unspecified: Secondary | ICD-10-CM

## 2018-08-22 DIAGNOSIS — M316 Other giant cell arteritis: Secondary | ICD-10-CM

## 2018-08-22 DIAGNOSIS — R443 Hallucinations, unspecified: Secondary | ICD-10-CM | POA: Diagnosis not present

## 2018-08-22 DIAGNOSIS — I10 Essential (primary) hypertension: Secondary | ICD-10-CM

## 2018-08-22 DIAGNOSIS — R413 Other amnesia: Secondary | ICD-10-CM | POA: Diagnosis not present

## 2018-08-22 NOTE — Assessment & Plan Note (Signed)
Her mood is stable, continue Mirtazapine 15mg  qd, Lorazepam 0.5mg  bid and q6h prn.

## 2018-08-22 NOTE — Assessment & Plan Note (Signed)
Heart rate is in control, continue Metoprolol 50mg  qd.

## 2018-08-22 NOTE — Assessment & Plan Note (Signed)
Stable, continue Prednisone 5mg  qd.

## 2018-08-22 NOTE — Assessment & Plan Note (Signed)
Continue memory care unit [facility] for safety and care assistance.

## 2018-08-22 NOTE — Progress Notes (Signed)
Location:  [facility] Room Number: 106 Place of Service:  SNF (31) Provider:  Saahil Herbster, Lennie Odor  NP   Pleshette Tomasini X, NP  Patient Care Team: Lanya Bucks X, NP as PCP - General (Internal Medicine) Fanny Skates, MD as Consulting Physician (General Surgery) [name], [name], MD as Consulting Physician (Oncology) [name], MD as Consulting Physician (Radiation Oncology) Rockwell Germany, RN as Registered Nurse Mauro Kaufmann, RN as Registered Nurse Gentry Fitz, MD as Consulting Physician (Family Medicine) Harold Mattes X, NP as Nurse Practitioner (Internal Medicine) Thompson Grayer, MD as Consulting Physician (Cardiology)  Extended Emergency Contact Information Primary Emergency Contact: Matthews,[name] Address: [address]          [place], [place] [address] [place] Phone: [phone] Relation: Daughter Secondary Emergency Contact: Clent [place] Phone: [phone] Relation: Other    Code Status:  DNR Goals of care: Advanced Directive information Advanced Directives 08/22/2018  Does Patient Have a Medical Advance Directive? Yes  Type of Paramedic of [place];Out of facility DNR (pink MOST or yellow form)  Does patient want to make changes to medical advance directive? No - Patient declined  Copy of [place] in Chart? Yes  Would patient like information on creating a medical advance directive? -  Pre-existing out of facility DNR order (yellow form or pink MOST form) Yellow form placed in chart (order not valid for inpatient use);Pink MOST form placed in chart (order not valid for inpatient use)     Chief Complaint  Patient presents with  . Medical Management of Chronic Issues    F/u HTN, afib, constipation, anxiety, depression,    HPI:  Pt is a 84 y.o. female seen today for medical management of chronic diseases.     The patient resides in  memory care unit, [facility], taking Quetiapine 25mg  qd, Mirtazapine 15mg  qd, Lorazepam 0.5mg  bid and q6h prn for mood and behaviorals related to dementia. Hx of HTN, blood pressure and heart rate are  controlled on Metoprolol 50mg  qd. Temporal arteritis is stable on Prednisone 5mg  qd.  Past Medical History:  Diagnosis Date  . Asthma    "as a child"  . Breast cancer ([place]) 12/08/2014   Bilateral  . Cancer of central portion of female breast ([place]) 12/04/2014  . Cancer of central portion of female breast ([place]) 12/04/2014  . Complete heart block ([place]) 1999   s/p PPM by Dr Olevia Perches, most recent generator change 2009  . Complication of anesthesia   . COPD (chronic obstructive pulmonary disease) ([place])   . Dementia ([place])   . Diverticulosis   . DJD (degenerative joint disease)    RA  . Esophageal stricture   . Family history of adverse reaction to anesthesia    Daughter N/V  . GERD (gastroesophageal reflux disease)   . Giant cell arteritis ([place])   . Goiter   . Hemorrhoids   . Hiatal hernia   . IBS (irritable bowel syndrome)   . Paroxysmal atrial fibrillation ([place]) 12/17/2015   asymptomatic, <1%, determined on regular pacemaker interrogation  . Pneumonia   . PONV (postoperative nausea and vomiting)   . S/P radiation therapy 04/24/15 completed   T-11  . Wears glasses    Past Surgical History:  Procedure Laterality Date  . ANAL FISSURE REPAIR    . ARTERY BIOPSY Left 01/03/2013   Procedure: BIOPSY LEFT TEMPORAL ARTERY;  Surgeon: Ascencion Dike, MD;  Location: [facility];  Service: ENT;  Laterality: Left;  . BREAST BIOPSY Right   . CHOLECYSTECTOMY    . COLONOSCOPY    . FRACTURE SURGERY     fx lt wrist  . HEMORRHOID SURGERY    . INSERT / REPLACE / [facility]   Gen change ([facility]) by Dr Olevia Perches 2009  . IR FLUORO GUIDE PORT INSERTION RIGHT  12/22/2017  . IR US GUIDE VASC ACCESS RIGHT  12/22/2017  . LUNG REMOVAL, PARTIAL  1950   left lower lobectomy for brochiectasis   . MASTECTOMY  Left 02/08/2018  . MOUTH SURGERY     patient unaware  . RECTAL POLYPECTOMY    . RENAL CYST EXCISION    . TONSILLECTOMY    . TOTAL MASTECTOMY Left 02/08/2018  . TOTAL MASTECTOMY Left 02/08/2018   Procedure: TOTAL MASTECTOMY LEFT;  Surgeon: Fanny Skates, MD;  Location: [facility];  Service: General;  Laterality: Left;    Allergies  Allergen Reactions  . Amoxicillin-Pot Clavulanate Other (See Comments)    Upset stomach Has patient had a PCN reaction causing immediate rash, facial/tongue/throat swelling, SOB or lightheadedness with hypotension: No Has patient had a PCN reaction causing severe rash involving mucus membranes or skin necrosis: No Has patient had a PCN reaction that required hospitalization: No Has patient had a PCN reaction occurring within the last 10 years: No If all of the above answers are "NO", then may proceed with Cephalosporin use.   [place] Kitchen Morphine And Related Nausea Only    Outpatient Encounter Medications as of 08/22/2018  Medication Sig  . feeding supplement (BOOST / RESOURCE BREEZE) LIQD Take 1 Container by mouth 3 (three) times daily with meals.   [place] Kitchen LORazepam (ATIVAN) 0.5 MG tablet Take 0.5 mg by mouth every 6 (six) hours as needed for anxiety.   Mar[place] Kitchen LORazepam (ATIVAN) 0.5 MG tablet Take 0.5 mg by mouth 2 (two) times daily. Take twice daily at 8:00am  and 5pm  . magnesium hydroxide (MILK OF MAGNESIA) 400 MG/5ML suspension Take 30 mLs by mouth daily as needed for mild constipation.  . metoprolol succinate (TOPROL-XL) 50 MG 24 hr tablet Take 50 mg by mouth daily. Take with or immediately following a meal.  . mirtazapine (REMERON) 15 MG tablet Take 15 mg by mouth at bedtime.   . ondansetron (ZOFRAN) 4 MG tablet Take 4 mg by mouth 3 (three) times daily.   Marland Kitchen oxycodone (OXY-IR) 5 MG capsule Take 5 mg by mouth every 6 (six) hours as needed.  . predniSONE (DELTASONE) 5 MG tablet Take 5 mg by mouth daily with breakfast.  . QUEtiapine (SEROQUEL) 25 MG tablet Take 25 mg by mouth  at bedtime.  . sennosides-docusate sodium (SENOKOT-S) 8.6-50 MG tablet Take 1 tablet by mouth at bedtime.  . traMADol (ULTRAM) 50 MG tablet Take 50 mg by mouth every 6 (six) hours as needed (FOR PAIN).    No facility-administered encounter medications on file as of 08/22/2018.    ROS was provided with assistance of staff Review of Systems  Constitutional: Negative for activity change, appetite change, chills, diaphoresis, fatigue, fever and unexpected weight change.  HENT: Positive for hearing loss. Negative for congestion and voice change.   Respiratory: Negative for cough and shortness of breath.   Cardiovascular: Negative for chest pain, palpitations and leg swelling.  Gastrointestinal: Negative for abdominal distention, abdominal pain, constipation, diarrhea, nausea and vomiting.  Genitourinary: Negative for difficulty urinating, dysuria and urgency.  Musculoskeletal: Positive for arthralgias  and gait problem.       Neck and mid back.   Skin: Negative for color change and pallor.  Neurological: Negative for dizziness, speech difficulty, weakness and headaches.       Memory lapses.   Psychiatric/Behavioral: Positive for confusion. Negative for agitation, behavioral problems, hallucinations and sleep disturbance. The patient is not nervous/anxious.     Immunization History  Administered Date(s) Administered  . Influenza Split 07/24/2011, 06/20/2012  . Influenza Whole 10/19/2001, 07/25/2007, 08/02/2008, 07/18/2009, 07/10/2010, 07/21/2018  . Influenza,inj,Quad PF,6+ Mos 07/18/2013, 08/27/2014  . Influenza-Unspecified 07/31/2015, 08/19/2016, 08/09/2017  . Pneumococcal Conjugate-13 08/16/2017  . Pneumococcal Polysaccharide-23 10/19/1997, 01/17/2010  . Td 10/19/1996, 07/25/2007   Pertinent  Health Maintenance Due  Topic Date Due  . INFLUENZA VACCINE  Completed  . DEXA SCAN  Completed  . PNA vac Low Risk Adult  Completed   Fall Risk  07/05/2018 07/01/2017 02/22/2017 05/22/2015 03/22/2015    Falls in the past year? No No No No No   Functional Status Survey:    Vitals:   08/22/18 1136  BP: 118/68  Pulse: 88  Resp: 18  Temp: (!) 97 F (36.1 C)  SpO2: 96%  Weight: 99 lb 9.6 oz (45.2 kg)  Height: 5\' 1"  (1.549 m)   Body mass index is 18.82 kg/m. Physical Exam  Constitutional: She appears well-developed and well-nourished.  HENT:  Head: Normocephalic and atraumatic.  Eyes: Pupils are equal, round, and reactive to light. EOM are normal.  Neck: Normal range of motion. Neck supple. No JVD present. No thyromegaly present.  Cardiovascular: Normal rate and regular rhythm.  Murmur heard. Pacemaker.   Pulmonary/Chest: Effort normal. She has no wheezes. She has no rales.  Decreased air entry left lung  Abdominal: Soft. She exhibits no distension. There is no tenderness. There is no rebound and no guarding.  Musculoskeletal: She exhibits no edema.  Ambulates with walker.   Neurological: She is alert. No cranial nerve deficit. She exhibits normal muscle tone. Coordination normal.  Oriented to person and place.   Skin: Skin is warm and dry.  Psychiatric: She has a normal mood and affect. Her behavior is normal.    Labs reviewed: Recent Labs    03/30/18 1327 03/31/18 0406 04/01/18 0349 04/26/18 05/02/18 06/09/18  NA 128* 134* 135 132* 136* 141  K 3.0* 4.9 3.8 4.0 4.6 3.9  CL 93* 102 107 95 99 104  CO2 26 20* 23 30 32 32  GLUCOSE 107* 153* 102*  --   --   --   BUN 5* 6 9 15 10 14   CREATININE 0.64 0.76 0.64 0.8 0.8 0.7  CALCIUM 8.4* 8.5* 7.7* 8.0 8.9 8.4  MG  --  1.8  --   --   --   --    Recent Labs    02/17/18 03/04/18 1034  03/30/18 1327 04/26/18 06/09/18  AST 37 37*   < > 46* 28 23  ALT 32 35   < > 34 20 17  ALKPHOS 71 92   < > 69 70 50  BILITOT 0.7 0.6  --  0.7  --   --   PROT  --  6.6  --  6.1* 5.1 5.0  ALBUMIN 3.7 3.3*   < > 3.0* 3.0 2.9   < > = values in this interval not displayed.   Recent Labs    02/03/18 1344  03/04/18 1034  03/30/18 1327  03/31/18 0406 04/01/18 0349 04/26/18 06/09/18  WBC 7.1   < >  5.8   < > 8.8 8.2 9.5 10.0 6.6  NEUTROABS 6.0  --  4.0  --  6.1  --   --   --   --   HGB 12.6   < > 12.1   < > 12.3 12.5 10.8* 11.4* 11.6*  HCT 39.5   < > 37.1   < > 36.4 37.2 32.6* 34* 35*  MCV 89.4   < > 85.9  --  81.8 81.6 83.0  --   --   PLT 225   < > 198   < > 188 247 223 284 304   < > = values in this interval not displayed.   Lab Results  Component Value Date   TSH 0.719 03/31/2018   Lab Results  Component Value Date   HGBA1C 6.1 (H) 02/09/2018   Lab Results  Component Value Date   CHOL 194 07/25/2007   HDL 62.7 07/25/2007   LDLCALC 118 (H) 07/25/2007   TRIG 69 07/25/2007   CHOLHDL 3.1 CALC 07/25/2007    Significant Diagnostic Results in last 30 days:  No results found.  Assessment/Plan Atrial fibrillation, new onset (HCC) Heart rate is in control, continue Metoprolol 50mg  qd.   Essential hypertension Blood pressure is in control, continue Metoprolol 50mg  qd .  Giant cell arteritis Stable, continue Prednisone 5mg  qd.   Anxiety and depression Her mood is stable, continue Mirtazapine 15mg  qd, Lorazepam 0.5mg  bid and q6h prn.   Hallucination Stable, continue Quetiapine 25mg  qd.   Memory loss Continue memory care unit [facility] for safety and care assistance.   Generalized osteoarthritis of multiple sites Neck and mid back, prn Oxycodone and Tramadol not used-will discontinue, continue BioFreeze bid and heat pack prn. Observe.      Family/ staff Communication: plan of care reviewed with the patient and charge nurse.   Labs/tests ordered:  none  Time spend 25 minutes.

## 2018-08-22 NOTE — Assessment & Plan Note (Signed)
Neck and mid back, prn Oxycodone and Tramadol not used-will discontinue, continue BioFreeze bid and heat pack prn. Observe.

## 2018-08-22 NOTE — Assessment & Plan Note (Signed)
Stable, continue Quetiapine 25mg  qd.

## 2018-08-22 NOTE — Assessment & Plan Note (Signed)
Blood pressure is in control, continue Metoprolol 50mg  qd .

## 2018-08-23 ENCOUNTER — Encounter: Payer: Self-pay | Admitting: Nurse Practitioner

## 2018-08-25 DIAGNOSIS — J449 Chronic obstructive pulmonary disease, unspecified: Secondary | ICD-10-CM | POA: Diagnosis not present

## 2018-08-25 DIAGNOSIS — C7951 Secondary malignant neoplasm of bone: Secondary | ICD-10-CM | POA: Diagnosis not present

## 2018-08-25 DIAGNOSIS — I4891 Unspecified atrial fibrillation: Secondary | ICD-10-CM | POA: Diagnosis not present

## 2018-08-25 DIAGNOSIS — R1310 Dysphagia, unspecified: Secondary | ICD-10-CM | POA: Diagnosis not present

## 2018-08-25 DIAGNOSIS — C50919 Malignant neoplasm of unspecified site of unspecified female breast: Secondary | ICD-10-CM | POA: Diagnosis not present

## 2018-08-25 DIAGNOSIS — G9340 Encephalopathy, unspecified: Secondary | ICD-10-CM | POA: Diagnosis not present

## 2018-08-30 DIAGNOSIS — D1801 Hemangioma of skin and subcutaneous tissue: Secondary | ICD-10-CM | POA: Diagnosis not present

## 2018-08-30 DIAGNOSIS — L814 Other melanin hyperpigmentation: Secondary | ICD-10-CM | POA: Diagnosis not present

## 2018-08-30 DIAGNOSIS — D2239 Melanocytic nevi of other parts of face: Secondary | ICD-10-CM | POA: Diagnosis not present

## 2018-08-30 DIAGNOSIS — L570 Actinic keratosis: Secondary | ICD-10-CM | POA: Diagnosis not present

## 2018-08-30 DIAGNOSIS — L821 Other seborrheic keratosis: Secondary | ICD-10-CM | POA: Diagnosis not present

## 2018-08-30 DIAGNOSIS — H61039 Chondritis of external ear, unspecified ear: Secondary | ICD-10-CM | POA: Diagnosis not present

## 2018-08-31 ENCOUNTER — Non-Acute Institutional Stay (SKILLED_NURSING_FACILITY): Payer: Medicare Other | Admitting: Nurse Practitioner

## 2018-08-31 ENCOUNTER — Encounter: Payer: Self-pay | Admitting: Nurse Practitioner

## 2018-08-31 DIAGNOSIS — F419 Anxiety disorder, unspecified: Secondary | ICD-10-CM

## 2018-08-31 DIAGNOSIS — G9340 Encephalopathy, unspecified: Secondary | ICD-10-CM | POA: Diagnosis not present

## 2018-08-31 DIAGNOSIS — R1310 Dysphagia, unspecified: Secondary | ICD-10-CM | POA: Diagnosis not present

## 2018-08-31 DIAGNOSIS — C50919 Malignant neoplasm of unspecified site of unspecified female breast: Secondary | ICD-10-CM | POA: Diagnosis not present

## 2018-08-31 DIAGNOSIS — M159 Polyosteoarthritis, unspecified: Secondary | ICD-10-CM

## 2018-08-31 DIAGNOSIS — I4891 Unspecified atrial fibrillation: Secondary | ICD-10-CM | POA: Diagnosis not present

## 2018-08-31 DIAGNOSIS — M316 Other giant cell arteritis: Secondary | ICD-10-CM

## 2018-08-31 DIAGNOSIS — F329 Major depressive disorder, single episode, unspecified: Secondary | ICD-10-CM | POA: Diagnosis not present

## 2018-08-31 DIAGNOSIS — J449 Chronic obstructive pulmonary disease, unspecified: Secondary | ICD-10-CM | POA: Diagnosis not present

## 2018-08-31 DIAGNOSIS — M26621 Arthralgia of right temporomandibular joint: Secondary | ICD-10-CM | POA: Diagnosis not present

## 2018-08-31 DIAGNOSIS — C7951 Secondary malignant neoplasm of bone: Secondary | ICD-10-CM | POA: Diagnosis not present

## 2018-08-31 DIAGNOSIS — F32A Depression, unspecified: Secondary | ICD-10-CM

## 2018-08-31 NOTE — Assessment & Plan Note (Signed)
Stable, continue Prednisone 5mg  qd.

## 2018-08-31 NOTE — Assessment & Plan Note (Signed)
No pain complained in neck/mid back, has been refusing BioFreeze-dc to the location.

## 2018-08-31 NOTE — Assessment & Plan Note (Signed)
Will apply BioFreeze qid to the R TMJ area x 7 days. Observe.

## 2018-08-31 NOTE — Assessment & Plan Note (Signed)
Not well adjusted, continue Seroquel 25mg  qd, Mirtazapine 15mg  qd, Lorazepam 0.5mg  bid for now. May consider increasing Mirtazapine 30mg  if no improvement.

## 2018-08-31 NOTE — Progress Notes (Signed)
Location:  [facility] Room Number: 106 Place of Service:  SNF (31) Provider:  Lorena Clearman, Lennie Odor  NP  Mattingly Fountaine X, NP  Patient Care Team: Esteen Delpriore X, NP as PCP - General (Internal Medicine) Fanny Skates, MD as Consulting Physician (General Surgery) [name], [name], MD as Consulting Physician (Oncology) [name], MD as Consulting Physician (Radiation Oncology) Rockwell Germany, RN as Registered Nurse Mauro Kaufmann, RN as Registered Nurse Gentry Fitz, MD as Consulting Physician (Family Medicine) Vyolet Sakuma X, NP as Nurse Practitioner (Internal Medicine) Thompson Grayer, MD as Consulting Physician (Cardiology)  Extended Emergency Contact Information Primary Emergency Contact: Matthews,[name] Address: [address]          [place], [place] [address] [place] Phone: [phone] Relation: Daughter Secondary Emergency Contact: Clent Demark [place] Phone: [phone] Relation: Other  Code Status:  DNR Goals of care: Advanced Directive information Advanced Directives 08/22/2018  Does Patient Have a Medical Advance Directive? Yes  Type of Paramedic of [place];Out of facility DNR (pink MOST or yellow form)  Does patient want to make changes to medical advance directive? No - Patient declined  Copy of [place] in Chart? Yes  Would patient like information on creating a medical advance directive? -  Pre-existing out of facility DNR order (yellow form or pink MOST form) Yellow form placed in chart (order not valid for inpatient use);Pink MOST form placed in chart (order not valid for inpatient use)     Chief Complaint  Patient presents with  . Medical Management of Chronic Issues    (R) sharp pain to ear lobe radiating to side of face    HPI:  Pt is a 84 y.o. female seen today for an acute visit for the right face pain x1, pain is not interfering her daily  routine or sleep, localized in TMJ, reproduced by chewing or palpation, no nodules or swelling area, no redness or warmth areas noted. S/p auricle skin lesion removal area has no s/s of infection. No mouth sore, sore throat, or dental issues. She is afebrile. She has been refusing BioFreeze to her neck/mid back-no pain per the patient.  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 She still exhibits difficulty adjusting to Memory Care Unit, calling her daughter frequently, wants to get out of unit and return to work, but she sleeps well, on Seroquel 25mg  qd, Mirtazapine 15mg  qd, Lorazepam 0.5mg  bid.     Past Medical History:  Diagnosis Date  . Asthma    "as a child"  . Breast cancer ([place] [place]) 12/08/2014   Bilateral  . Cancer of central portion of female breast ([place]) 12/04/2014  . Cancer of central portion of female breast ([place]) 12/04/2014  . Complete heart block ([facility]) 1999   s/p PPM by Dr Olevia Perches, most recent generator change 2009  . Complication of anesthesia   . COPD (chronic obstructive pulmonary disease) ([place])   . Dementia ([place])   . Diverticulosis   . DJD (degenerative joint disease)    RA  . Esophageal stricture   . Family history of adverse reaction to anesthesia    Daughter N/V  . GERD (gastroesophageal reflux disease)   . Giant cell arteritis ([facility])   . Goiter   . Hemorrhoids   . Hiatal hernia   . IBS (irritable bowel syndrome)   . Paroxysmal atrial fibrillation ([facility]) 12/17/2015   asymptomatic, <1%, determined on  regular pacemaker interrogation  . Pneumonia   . PONV (postoperative nausea and vomiting)   . S/P radiation therapy 04/24/15 completed   T-11  . Wears glasses    Past Surgical History:  Procedure Laterality Date  . ANAL FISSURE REPAIR    . ARTERY BIOPSY Left 01/03/2013   Procedure: BIOPSY LEFT TEMPORAL ARTERY;  Surgeon: Ascencion Dike, MD;  Location: [facility];  Service: ENT;  Laterality: Left;  . BREAST BIOPSY Right   . CHOLECYSTECTOMY    .  COLONOSCOPY    . FRACTURE SURGERY     fx lt wrist  . HEMORRHOID SURGERY    . INSERT / REPLACE / [facility]   Gen change ([facility]) by Dr Olevia Perches 2009  . IR FLUORO GUIDE PORT INSERTION RIGHT  12/22/2017  . IR US GUIDE VASC ACCESS RIGHT  12/22/2017  . LUNG REMOVAL, PARTIAL  1950   left lower lobectomy for brochiectasis   . MASTECTOMY Left 02/08/2018  . MOUTH SURGERY     patient unaware  . RECTAL POLYPECTOMY    . RENAL CYST EXCISION    . TONSILLECTOMY    . TOTAL MASTECTOMY Left 02/08/2018  . TOTAL MASTECTOMY Left 02/08/2018   Procedure: TOTAL MASTECTOMY LEFT;  Surgeon: Fanny Skates, MD;  Location: [facility];  Service: General;  Laterality: Left;    Allergies  Allergen Reactions  . Amoxicillin-Pot Clavulanate Other (See Comments)    Upset stomach Has patient had a PCN reaction causing immediate rash, facial/tongue/throat swelling, SOB or lightheadedness with hypotension: No Has patient had a PCN reaction causing severe rash involving mucus membranes or skin necrosis: No Has patient had a PCN reaction that required hospitalization: No Has patient had a PCN reaction occurring within the last 10 years: No If all of the above answers are "NO", then may proceed with Cephalosporin use.   Marland Kitchen Morphine And Related Nausea Only    Outpatient Encounter Medications as of 08/31/2018  Medication Sig  . acetaminophen (TYLENOL) 325 MG tablet Take 650 mg by mouth every 4 (four) hours as needed.  . feeding supplement (BOOST / RESOURCE BREEZE) LIQD Take 1 Container by mouth 3 (three) times daily with meals.   Marland Kitchen LORazepam (ATIVAN) 0.5 MG tablet Take 0.5 mg by mouth 2 (two) times daily. Take twice daily at 8:00am  and 5pm  . magnesium hydroxide (MILK OF MAGNESIA) 400 MG/5ML suspension Take 30 mLs by mouth daily as needed for mild constipation.  . Menthol, Topical Analgesic, (BIOFREEZE) 4 % GEL Apply small amt. to the back of the neck and down the to the mid back. Apply Biofreeze to neck, shoulders and  upper back PRN for pain four times a day  . metoprolol succinate (TOPROL-XL) 50 MG 24 hr tablet Take 50 mg by mouth daily. Take with or immediately following a meal.  . mirtazapine (REMERON) 15 MG tablet Take 15 mg by mouth at bedtime.   . mupirocin ointment (BACTROBAN) 2 % Apply 1 application topically 2 (two) times daily. APPLY SMALL AMOUNT TO RIGHT EAR TWICE DAILY TIMES SEVEN DAYS. APPLY SMALL AMOUNT TO RIGHT EAR TWICE DAILY AS NEEDED UNTIL HEALED.  Marland Kitchen ondansetron (ZOFRAN) 4 MG tablet Take 4 mg by mouth 3 (three) times daily.   . predniSONE (DELTASONE) 5 MG tablet Take 5 mg by mouth daily with breakfast.  . QUEtiapine (SEROQUEL) 25 MG tablet Take 25 mg by mouth at bedtime.  . sennosides-docusate sodium (SENOKOT-S) 8.6-50 MG tablet Take 1 tablet by mouth  at bedtime.  . [DISCONTINUED] LORazepam (ATIVAN) 0.5 MG tablet Take 0.5 mg by mouth every 6 (six) hours as needed for anxiety.   . [DISCONTINUED] oxycodone (OXY-IR) 5 MG capsule Take 5 mg by mouth every 6 (six) hours as needed.  . [DISCONTINUED] traMADol (ULTRAM) 50 MG tablet Take 50 mg by mouth every 6 (six) hours as needed (FOR PAIN).    No facility-administered encounter medications on file as of 08/31/2018.     Review of Systems  Constitutional: Negative for activity change, appetite change, chills, diaphoresis, fatigue and fever.  HENT: Positive for hearing loss. Negative for congestion and voice change.   Respiratory: Negative for cough, shortness of breath and wheezing.   Cardiovascular: Negative for chest pain, palpitations and leg swelling.  Gastrointestinal: Negative for abdominal distention, abdominal pain, constipation, diarrhea, nausea and vomiting.  Genitourinary: Negative for difficulty urinating, dysuria and urgency.  Musculoskeletal: Positive for arthralgias, back pain and gait problem. Negative for joint swelling and neck pain.  Skin: Negative for color change and pallor.  Neurological: Negative for dizziness, speech  difficulty, weakness and headaches.       Memory lapses.   Psychiatric/Behavioral: Positive for confusion. Negative for agitation, behavioral problems and sleep disturbance. The patient is nervous/anxious.     Immunization History  Administered Date(s) Administered  . Influenza Split 07/24/2011, 06/20/2012  . Influenza Whole 10/19/2001, 07/25/2007, 08/02/2008, 07/18/2009, 07/10/2010, 07/21/2018  . Influenza,inj,Quad PF,6+ Mos 07/18/2013, 08/27/2014  . Influenza-Unspecified 07/31/2015, 08/19/2016, 08/09/2017  . Pneumococcal Conjugate-13 08/16/2017  . Pneumococcal Polysaccharide-23 10/19/1997, 01/17/2010  . Td 10/19/1996, 07/25/2007   Pertinent  Health Maintenance Due  Topic Date Due  . INFLUENZA VACCINE  Completed  . DEXA SCAN  Completed  . PNA vac Low Risk Adult  Completed   Fall Risk  07/05/2018 07/01/2017 02/22/2017 05/22/2015 03/22/2015  Falls in the past year? No No No No No   Functional Status Survey:    Vitals:   08/31/18 1050  BP: 132/70  Pulse: 69  Resp: 20  Temp: (!) 97.2 F (36.2 C)  SpO2: 98%  Weight: 99 lb 9.6 oz (45.2 kg)  Height: 5\' 1"  (1.549 m)   Body mass index is 18.82 kg/m. Physical Exam  Constitutional: She appears well-developed and well-nourished.  HENT:  Head: Normocephalic and atraumatic.  Nose: Nose normal.  Mouth/Throat: Oropharynx is clear and moist. No oropharyngeal exudate.  Eyes: Pupils are equal, round, and reactive to light.  Neck: Normal range of motion. Neck supple. No JVD present. No thyromegaly present.  Cardiovascular: Normal rate and regular rhythm.  Pulmonary/Chest: Effort normal. She has no wheezes. She has no rales.  Decreased air entry left lung  Abdominal: Soft. She exhibits no distension. There is no tenderness. There is no rebound and no guarding.  Musculoskeletal: Normal range of motion. She exhibits tenderness. She exhibits no edema.  Ambulates with walker. Right TMJ pain with movement and palpation.    Neurological: She is  alert. No cranial nerve deficit. She exhibits normal muscle tone. Coordination normal.  Skin: Skin is warm and dry.  Right auricle skin lesion removal area has no s/s of infection.   Psychiatric: She has a normal mood and affect. Her behavior is normal.    Labs reviewed: Recent Labs    03/30/18 1327 03/31/18 0406 04/01/18 0349 04/26/18 05/02/18 06/09/18  NA 128* 134* 135 132* 136* 141  K 3.0* 4.9 3.8 4.0 4.6 3.9  CL 93* 102 107 95 99 104  CO2 26 20* 23 30 32 32  GLUCOSE 107* 153* 102*  --   --   --   BUN 5* 6 9 15 10 14   CREATININE 0.64 0.76 0.64 0.8 0.8 0.7  CALCIUM 8.4* 8.5* 7.7* 8.0 8.9 8.4  MG  --  1.8  --   --   --   --    Recent Labs    02/17/18 03/04/18 1034  03/30/18 1327 04/26/18 06/09/18  AST 37 37*   < > 46* 28 23  ALT 32 35   < > 34 20 17  ALKPHOS 71 92   < > 69 70 50  BILITOT 0.7 0.6  --  0.7  --   --   PROT  --  6.6  --  6.1* 5.1 5.0  ALBUMIN 3.7 3.3*   < > 3.0* 3.0 2.9   < > = values in this interval not displayed.   Recent Labs    02/03/18 1344  03/04/18 1034  03/30/18 1327 03/31/18 0406 04/01/18 0349 04/26/18 06/09/18  WBC 7.1   < > 5.8   < > 8.8 8.2 9.5 10.0 6.6  NEUTROABS 6.0  --  4.0  --  6.1  --   --   --   --   HGB 12.6   < > 12.1   < > 12.3 12.5 10.8* 11.4* 11.6*  HCT 39.5   < > 37.1   < > 36.4 37.2 32.6* 34* 35*  MCV 89.4   < > 85.9  --  81.8 81.6 83.0  --   --   PLT 225   < > 198   < > 188 247 223 284 304   < > = values in this interval not displayed.   Lab Results  Component Value Date   TSH 0.719 03/31/2018   Lab Results  Component Value Date   HGBA1C 6.1 (H) 02/09/2018   Lab Results  Component Value Date   CHOL 194 07/25/2007   HDL 62.7 07/25/2007   LDLCALC 118 (H) 07/25/2007   TRIG 69 07/25/2007   CHOLHDL 3.1 CALC 07/25/2007    Significant Diagnostic Results in last 30 days:  No results found.  Assessment/Plan TMJ tenderness, right Will apply BioFreeze qid to the R TMJ area x 7 days. Observe.   Temporal  arteritis Stable, continue Prednisone 5mg  qd.   Anxiety and depression Not well adjusted, continue Seroquel 25mg  qd, Mirtazapine 15mg  qd, Lorazepam 0.5mg  bid for now. May consider increasing Mirtazapine 30mg  if no improvement.     Generalized osteoarthritis of multiple sites No pain complained in neck/mid back, has been refusing BioFreeze-dc to the location.     Family/ staff Communication: plan of care reviewed with the patient and charge nurse.   Labs/tests ordered:  None  Time spend 25 minutes.

## 2018-09-01 DIAGNOSIS — C50919 Malignant neoplasm of unspecified site of unspecified female breast: Secondary | ICD-10-CM | POA: Diagnosis not present

## 2018-09-01 DIAGNOSIS — R1310 Dysphagia, unspecified: Secondary | ICD-10-CM | POA: Diagnosis not present

## 2018-09-01 DIAGNOSIS — C7951 Secondary malignant neoplasm of bone: Secondary | ICD-10-CM | POA: Diagnosis not present

## 2018-09-01 DIAGNOSIS — I4891 Unspecified atrial fibrillation: Secondary | ICD-10-CM | POA: Diagnosis not present

## 2018-09-01 DIAGNOSIS — G9340 Encephalopathy, unspecified: Secondary | ICD-10-CM | POA: Diagnosis not present

## 2018-09-01 DIAGNOSIS — J449 Chronic obstructive pulmonary disease, unspecified: Secondary | ICD-10-CM | POA: Diagnosis not present

## 2018-09-08 DIAGNOSIS — R1310 Dysphagia, unspecified: Secondary | ICD-10-CM | POA: Diagnosis not present

## 2018-09-08 DIAGNOSIS — I4891 Unspecified atrial fibrillation: Secondary | ICD-10-CM | POA: Diagnosis not present

## 2018-09-08 DIAGNOSIS — G9340 Encephalopathy, unspecified: Secondary | ICD-10-CM | POA: Diagnosis not present

## 2018-09-08 DIAGNOSIS — C7951 Secondary malignant neoplasm of bone: Secondary | ICD-10-CM | POA: Diagnosis not present

## 2018-09-08 DIAGNOSIS — C50919 Malignant neoplasm of unspecified site of unspecified female breast: Secondary | ICD-10-CM | POA: Diagnosis not present

## 2018-09-08 DIAGNOSIS — J449 Chronic obstructive pulmonary disease, unspecified: Secondary | ICD-10-CM | POA: Diagnosis not present

## 2018-09-12 DIAGNOSIS — I4891 Unspecified atrial fibrillation: Secondary | ICD-10-CM | POA: Diagnosis not present

## 2018-09-12 DIAGNOSIS — J449 Chronic obstructive pulmonary disease, unspecified: Secondary | ICD-10-CM | POA: Diagnosis not present

## 2018-09-12 DIAGNOSIS — R1310 Dysphagia, unspecified: Secondary | ICD-10-CM | POA: Diagnosis not present

## 2018-09-12 DIAGNOSIS — G9340 Encephalopathy, unspecified: Secondary | ICD-10-CM | POA: Diagnosis not present

## 2018-09-12 DIAGNOSIS — C50919 Malignant neoplasm of unspecified site of unspecified female breast: Secondary | ICD-10-CM | POA: Diagnosis not present

## 2018-09-12 DIAGNOSIS — C7951 Secondary malignant neoplasm of bone: Secondary | ICD-10-CM | POA: Diagnosis not present

## 2018-09-18 DIAGNOSIS — K589 Irritable bowel syndrome without diarrhea: Secondary | ICD-10-CM | POA: Diagnosis not present

## 2018-09-18 DIAGNOSIS — N183 Chronic kidney disease, stage 3 (moderate): Secondary | ICD-10-CM | POA: Diagnosis not present

## 2018-09-18 DIAGNOSIS — C7951 Secondary malignant neoplasm of bone: Secondary | ICD-10-CM | POA: Diagnosis not present

## 2018-09-18 DIAGNOSIS — J301 Allergic rhinitis due to pollen: Secondary | ICD-10-CM | POA: Diagnosis not present

## 2018-09-18 DIAGNOSIS — C50919 Malignant neoplasm of unspecified site of unspecified female breast: Secondary | ICD-10-CM | POA: Diagnosis not present

## 2018-09-18 DIAGNOSIS — J449 Chronic obstructive pulmonary disease, unspecified: Secondary | ICD-10-CM | POA: Diagnosis not present

## 2018-09-18 DIAGNOSIS — K219 Gastro-esophageal reflux disease without esophagitis: Secondary | ICD-10-CM | POA: Diagnosis not present

## 2018-09-18 DIAGNOSIS — D630 Anemia in neoplastic disease: Secondary | ICD-10-CM | POA: Diagnosis not present

## 2018-09-18 DIAGNOSIS — I4891 Unspecified atrial fibrillation: Secondary | ICD-10-CM | POA: Diagnosis not present

## 2018-09-18 DIAGNOSIS — R1310 Dysphagia, unspecified: Secondary | ICD-10-CM | POA: Diagnosis not present

## 2018-09-18 DIAGNOSIS — G9340 Encephalopathy, unspecified: Secondary | ICD-10-CM | POA: Diagnosis not present

## 2018-09-22 ENCOUNTER — Non-Acute Institutional Stay (SKILLED_NURSING_FACILITY): Payer: Medicare Other | Admitting: Family Medicine

## 2018-09-22 ENCOUNTER — Encounter: Payer: Self-pay | Admitting: Family Medicine

## 2018-09-22 DIAGNOSIS — I4891 Unspecified atrial fibrillation: Secondary | ICD-10-CM | POA: Diagnosis not present

## 2018-09-22 DIAGNOSIS — C3482 Malignant neoplasm of overlapping sites of left bronchus and lung: Secondary | ICD-10-CM | POA: Diagnosis not present

## 2018-09-22 DIAGNOSIS — I442 Atrioventricular block, complete: Secondary | ICD-10-CM

## 2018-09-22 DIAGNOSIS — C50919 Malignant neoplasm of unspecified site of unspecified female breast: Secondary | ICD-10-CM

## 2018-09-22 DIAGNOSIS — M316 Other giant cell arteritis: Secondary | ICD-10-CM | POA: Diagnosis not present

## 2018-09-22 DIAGNOSIS — C7951 Secondary malignant neoplasm of bone: Secondary | ICD-10-CM | POA: Diagnosis not present

## 2018-09-22 NOTE — Progress Notes (Signed)
Provider:  Alain Honey, MD Location:  [facility] Room Number: [facility] Place of Service:  SNF (31)  PCP: [name], [name], NP Patient Care Team: [name], [name], NP as PCP - General (Internal Medicine) Fanny Skates, MD as Consulting Physician (General Surgery) [name], [name], MD as Consulting Physician (Oncology) [name], MD as Consulting Physician (Radiation Oncology) Rockwell Germany, RN as Registered Nurse Mauro Kaufmann, RN as Registered Nurse Gentry Fitz, MD as Consulting Physician (Family Medicine) [name], [name], NP as Nurse Practitioner (Internal Medicine) Thompson Grayer, MD as Consulting Physician (Cardiology)  Extended Emergency Contact Information Primary Emergency Contact: [facility] Address: [address]          [place], [place] [address] [place] Phone: [phone] Relation: Daughter Secondary Emergency Contact: Clent Demark [place] Phone: [phone] Relation: Other  Code Status:DNR  Goals of Care: Advanced Directive information Advanced Directives 09/22/2018  Does Patient Have a Medical Advance Directive? Yes  Type of Paramed[facility] of [place];Out of facility DNR (pink MOST or yellow form)  Does patient want to make changes to medical advance directive? No - Patient declined  Copy of [place] in Chart? Yes - validated most recent copy scanned in chart (See row information)  Would patient like information on creating a medical advance directive? -  Pre-existing out of facility DNR order (yellow form or pink MOST form) Yellow form placed in chart (order not valid for inpatient use);Pink MOST form placed in chart (order not valid for inpatient use)      Chief Complaint  Patient presents with  . Medical Management of Chronic Issues    Routine visit    HPI: Patient is a 84 y.o. female seen today for medical management of chronic  problems including: Depressive disorder, poly-osteoarthritis, essential hypertension, atrial fibrillation, and breast cancer.  She is followed by hospice.  She moved from assisted living to memory care back in September as she was exhibiting some elopement behavior.  She has done well in this setting.  Vital signs are stable.  Nurses notes report no problems and indeed she is an ideal patient and that she is independent with her ADLs.  Past Medical History:  Diagnosis Date  . Asthma    "as a child"  . Breast cancer ([place]) 12/08/2014   Bilateral  . Cancer of central portion of female breast ([place]) 12/04/2014  . Cancer of central portion of female breast ([place]) 12/04/2014  . Complete heart block ([place]) 1999   s/p PPM by Dr Olevia Perches, most recent generator change 2009  . Complication of anesthesia   . COPD (chronic obstructive pulmonary disease) ([place])   . Dementia ([place])   . Diverticulosis   . DJD (degenerative joint disease)    RA  . Esophageal stricture   . Family history of adverse reaction to anesthesia    Daughter N/V  . GERD (gastroesophageal reflux disease)   . Giant cell arteritis ([place])   . Goiter   . Hemorrhoids   . Hiatal hernia   . IBS (irritable bowel syndrome)   . Paroxysmal atrial fibrillation ([place]) 12/17/2015   asymptomatic, <1%, determined on regular pacemaker interrogation  . Pneumonia   . PONV (postoperative nausea and vomiting)   . S/P radiation therapy 04/24/15 completed   T-11  . Wears glasses    Past Surgical History:  Procedure Laterality Date  . ANAL FISSURE REPAIR    .  ARTERY BIOPSY Left 01/03/2013   Procedure: BIOPSY LEFT TEMPORAL ARTERY;  Surgeon: Ascencion Dike, MD;  Location: [facility];  Service: ENT;  Laterality: Left;  . BREAST BIOPSY Right   . CHOLECYSTECTOMY    . COLONOSCOPY    . FRACTURE SURGERY     fx lt wrist  . HEMORRHOID SURGERY    . INSERT / REPLACE / [place]   Gen change ([facility]) by Dr Olevia Perches 2009  . IR FLUORO GUIDE PORT  INSERTION RIGHT  12/22/2017  . IR US GUIDE VASC ACCESS RIGHT  12/22/2017  . LUNG REMOVAL, PARTIAL  1950   left lower lobectomy for brochiectasis   . MASTECTOMY Left 02/08/2018  . MOUTH SURGERY     patient unaware  . RECTAL POLYPECTOMY    . RENAL CYST EXCISION    . TONSILLECTOMY    . TOTAL MASTECTOMY Left 02/08/2018  . TOTAL MASTECTOMY Left 02/08/2018   Procedure: TOTAL MASTECTOMY LEFT;  Surgeon: Fanny Skates, MD;  Location: [facility];  Service: General;  Laterality: Left;    reports that she has never smoked. She has never used smokeless tobacco. She reports that she does not drink alcohol or use drugs. Social History   Socioeconomic History  . Marital status: Married    Spouse name: Not on file  . Number of children: 4  . Years of education: Not on file  . Highest education level: Not on file  Occupational History  . Occupation: Retired  Scientific laboratory technician  . Financial resource strain: Not hard at all  . Food insecurity:    Worry: Never true    Inability: Never true  . Transportation needs:    Medical: No    Non-medical: No  Tobacco Use  . Smoking status: Never Smoker  . Smokeless tobacco: Never Used  Substance and Sexual Activity  . Alcohol use: Never    Alcohol/week: 0.0 standard drinks    Frequency: Never    Comment: Occasional   . Drug use: No  . Sexual activity: Not Currently  Lifestyle  . Physical activity:    Days per week: 0 days    Minutes per session: 0 min  . Stress: Only a little  Relationships  . Social connections:    Talks on phone: Not on file    Gets together: Not on file    Attends religious service: Never    Active member of club or organization: No    Attends meetings of clubs or organizations: Never    Relationship status: Married  . Intimate partner violence:    Fear of current or ex partner: No    Emotionally abused: No    Physically abused: No    Forced sexual activity: No  Other Topics Concern  . Not on file  Social History Narrative    Social History     Marital status: Widowed          Spouse name:                        Years of education:                 Number of children: 4           Occupational History   Occupation: Copywriter, advertising  Retired : Yes                                      Social History Main Topics     Smoking status: Never Smoker                                                                  Smokeless tobacco: Never Used                         Alcohol use:         0.0 oz/week        Comment:      Drug use: No               Sexual activity:        Other Topics            Concern     None on file      Social History Narrative     Do you drink/eat things with caffeine?    Yes     Marital status: Widowed.  What year were you married? 1948     Do you have a living will? Yes     Do you have DNR form? Yes     Do you have signed POA/HPOA forms? Yes             Functional Status Survey:    Family History  Problem Relation Age of Onset  . Heart disease Mother   . Breast cancer Mother   . Stomach cancer Father   . Alcohol abuse Father   . Seizures Son   . Thyroid disease Sister     Health Maintenance  Topic Date Due  . TETANUS/TDAP  07/24/2017  . INFLUENZA VACCINE  Completed  . DEXA SCAN  Completed  . PNA vac Low Risk Adult  Completed    Allergies  Allergen Reactions  . Amoxicillin-Pot Clavulanate Other (See Comments)    Upset stomach Has patient had a PCN reaction causing immediate rash, facial/tongue/throat swelling, SOB or lightheadedness with hypotension: No Has patient had a PCN reaction causing severe rash involving mucus membranes or skin necrosis: No Has patient had a PCN reaction that required hospitalization: No Has patient had a PCN reaction occurring within the last 10 years: No If all of the above answers are "NO", then may proceed with Cephalosporin use.   Marland Kitchen Morphine And Related Nausea Only    Outpatient Encounter  Medications as of 09/22/2018  Medication Sig  . feeding supplement (BOOST / RESOURCE BREEZE) LIQD Take 1 Container by mouth 3 (three) times daily with meals.   Marland Kitchen LORazepam (ATIVAN) 0.5 MG tablet Take 0.5 mg by mouth 2 (two) times daily. Take twice daily at 8:00am  and 5pm  . magnesium hydroxide (MILK OF MAGNESIA) 400 MG/5ML suspension Take 30 mLs by mouth daily as needed for mild constipation.  . Menthol, Topical Analgesic, (BIOFREEZE) 4 % GEL Apply small amt. to the back of the neck and down the to the mid back. Apply Biofreeze to neck, shoulders and upper back PRN for pain four times a day  . metoprolol  succinate (TOPROL-XL) 50 MG 24 hr tablet Take 50 mg by mouth daily. Take with or immediately following a meal.  . mirtazapine (REMERON) 15 MG tablet Take 15 mg by mouth at bedtime.   . ondansetron (ZOFRAN) 4 MG tablet Take 4 mg by mouth 3 (three) times daily.   . predniSONE (DELTASONE) 5 MG tablet Take 5 mg by mouth daily with breakfast.  . QUEtiapine (SEROQUEL) 25 MG tablet Take 25 mg by mouth at bedtime.  . sennosides-docusate sodium (SENOKOT-S) 8.6-50 MG tablet Take 1 tablet by mouth at bedtime.  . [DISCONTINUED] acetaminophen (TYLENOL) 325 MG tablet Take 650 mg by mouth every 4 (four) hours as needed.   No facility-administered encounter medications on file as of 09/22/2018.     Review of Systems  Constitutional: Negative.  Negative for unexpected weight change.  HENT: Negative.   Respiratory: Negative.   Cardiovascular: Negative.   Musculoskeletal: Positive for gait problem.  Psychiatric/Behavioral: Positive for confusion.    Vitals:   09/22/18 1004  BP: (!) 160/80  Pulse: 76  Resp: 18  Temp: (!) 97 F (36.1 C)  SpO2: 97%  Weight: 104 lb (47.2 kg)  Height: 5\' 1"  (1.549 m)   Body mass index is 19.65 kg/m. Physical Exam  Constitutional: She is oriented to person, place, and time. She appears well-developed and well-nourished.  HENT:  Head: Normocephalic.  Mouth/Throat:  Oropharynx is clear and moist.  Eyes: Pupils are equal, round, and reactive to light.  Neck: Normal range of motion.  Cardiovascular: Normal rate and regular rhythm.  Pulmonary/Chest: Effort normal and breath sounds normal.  Abdominal: Soft. Bowel sounds are normal.  Neurological: She is alert and oriented to person, place, and time.  Nursing note and vitals reviewed.   Labs reviewed: Basic Metabolic Panel: Recent Labs    03/30/18 1327 03/31/18 0406 04/01/18 0349 04/26/18 05/02/18 06/09/18  NA 128* 134* 135 132* 136* 141  K 3.0* 4.9 3.8 4.0 4.6 3.9  CL 93* 102 107 95 99 104  CO2 26 20* 23 30 32 32  GLUCOSE 107* 153* 102*  --   --   --   BUN 5* 6 9 15 10 14   CREATININE 0.64 0.76 0.64 0.8 0.8 0.7  CALCIUM 8.4* 8.5* 7.7* 8.0 8.9 8.4  MG  --  1.8  --   --   --   --    Liver Function Tests: Recent Labs    02/17/18 03/04/18 1034  03/30/18 1327 04/26/18 06/09/18  AST 37 37*   < > 46* 28 23  ALT 32 35   < > 34 20 17  ALKPHOS 71 92   < > 69 70 50  BILITOT 0.7 0.6  --  0.7  --   --   PROT  --  6.6  --  6.1* 5.1 5.0  ALBUMIN 3.7 3.3*   < > 3.0* 3.0 2.9   < > = values in this interval not displayed.   No results for input(s): LIPASE, AMYLASE in the last 8760 hours. No results for input(s): AMMONIA in the last 8760 hours. CBC: Recent Labs    02/03/18 1344  03/04/18 1034  03/30/18 1327 03/31/18 0406 04/01/18 0349 04/26/18 06/09/18  WBC 7.1   < > 5.8   < > 8.8 8.2 9.5 10.0 6.6  NEUTROABS 6.0  --  4.0  --  6.1  --   --   --   --   HGB 12.6   < > 12.1   < >  12.3 12.5 10.8* 11.4* 11.6*  HCT 39.5   < > 37.1   < > 36.4 37.2 32.6* 34* 35*  MCV 89.4   < > 85.9  --  81.8 81.6 83.0  --   --   PLT 225   < > 198   < > 188 247 223 284 304   < > = values in this interval not displayed.   Cardiac Enzymes: No results for input(s): CKTOTAL, CKMB, CKMBINDEX, TROPONINI in the last 8760 hours. BNP: Invalid input(s): POCBNP Lab Results  Component Value Date   HGBA1C 6.1 (H) 02/09/2018    Lab Results  Component Value Date   TSH 0.719 03/31/2018   Lab Results  Component Value Date   [id number] 299 02/22/2017   No results found for: FOLATE No results found for: IRON, TIBC, FERRITIN  Imaging and Procedures obtained prior to SNF admission: Dg Chest 2 View  Result Date: 03/30/2018 CLINICAL DATA:  Chest tightness with cough. EXAM: CHEST - 2 VIEW COMPARISON:  PET-CT 10/15/2017.  Chest x-ray 02/27/2013 FINDINGS: 1151 hours. Lungs are hyperexpanded. The lungs are clear without focal pneumonia, edema, pneumothorax or pleural effusion. Interstitial markings are diffusely coarsened with chronic features. Left suprahilar lesion compatible with the abnormality seen on previous PET-CT. Calcified granuloma noted in the medial parahilar right lung. The cardiopericardial silhouette is within normal limits for size. Left permanent pacemaker again noted. Telemetry leads overlie the chest. IMPRESSION: 1. Hyperexpansion without acute cardiopulmonary findings. 2. Left suprahilar opacity compatible with left upper lobe lesion seen on previous PET-CT. Electronically Signed   By: Misty Stanley M.D.   On: 03/30/2018 12:41   Ct Angio Chest Pe W/cm &/or Wo Cm  Result Date: 03/30/2018 CLINICAL DATA:  Upper respiratory infection.  Coughing.  Wheezing. EXAM: CT ANGIOGRAPHY CHEST WITH CONTRAST TECHNIQUE: Multidetector CT imaging of the chest was performed using the standard protocol during bolus administration of intravenous contrast. Multiplanar CT image reconstructions and MIPs were obtained to evaluate the vascular anatomy. CONTRAST:  166mL ISOVUE-370 IOPAMIDOL (ISOVUE-370) INJECTION 76% COMPARISON:  PET-CT 10/15/2017 FINDINGS: Cardiovascular: LEFT-sided pacemaker. No significant vascular findings. Normal heart size. No pericardial effusion. Port in the anterior chest wall with tip in distal SVC. Mediastinum/Nodes: No axillary or supraclavicular adenopathy. Nodular enlargement of the LEFT lobe of the thyroid  gland nodules measure 2 cm. These nodules were not hypermetabolic comparison PET-CT scan. No mediastinal hilar adenopathy.  Small hiatal hernia. Lungs/Pleura: Small bilateral pleural effusions. Calcified scar in the LEFT lower lobe following LEFT lower lobe lobectomy. Calcified granuloma in the RIGHT upper lobe. No suspicious pulmonary nodules. A ground-glass opacity in the superior segment of the posterior LEFT upper lobe measures 3.8 by 2.2 cm unchanged (image 41/10. This mixed density nodule was mildly hypermetabolic on comparison PET-CT scan. Upper Abdomen: Limited view of the liver, kidneys, pancreas are unremarkable. Normal adrenal glands. Musculoskeletal: Stable sclerotic lesion in the T9 vertebral body. Seroma in the LEFT breast at mastectomy site. Review of the MIP images confirms the above findings. IMPRESSION: 1. Elongated sub solid lesion in the LEFT upper lobe unchanged from comparison PET and CT scan. 2. Bilateral small effusions. 3. Small hiatal hernia. Electronically Signed   By: Suzy Bouchard M.D.   On: 03/30/2018 16:16    Assessment/Plan  1.  Dementia Patient is doing well in this environment.  She is taking lorazepam twice daily for agitation but also on Seroquel 25 mg at bedtime.  Symptoms are well managed     5. Carcinoma  of breast metastatic to bone, unspecified laterality ([place]) Stage IV cancer.  Last chemotherapy caused pneumonitis.  Believe all treatments have been discontinued since patient is on hospice  2. Atrial fibrillation, new onset ([place]) Rate controlled with metoprolol 50 mg.  This also helps control her blood pressure which has been good  3. Giant cell arteritis ([facility]) Asymptomatic on prednisone 5 mg   Family/ staff Communication: Findings communicated to staff  Labs/tests ordered:  Lillette Boxer. Sabra Heck, [place] [address] [place], [place] Office [phone]

## 2018-09-27 DIAGNOSIS — I4891 Unspecified atrial fibrillation: Secondary | ICD-10-CM | POA: Diagnosis not present

## 2018-09-27 DIAGNOSIS — C50919 Malignant neoplasm of unspecified site of unspecified female breast: Secondary | ICD-10-CM | POA: Diagnosis not present

## 2018-09-27 DIAGNOSIS — G9340 Encephalopathy, unspecified: Secondary | ICD-10-CM | POA: Diagnosis not present

## 2018-09-27 DIAGNOSIS — C7951 Secondary malignant neoplasm of bone: Secondary | ICD-10-CM | POA: Diagnosis not present

## 2018-09-27 DIAGNOSIS — R1310 Dysphagia, unspecified: Secondary | ICD-10-CM | POA: Diagnosis not present

## 2018-09-27 DIAGNOSIS — J449 Chronic obstructive pulmonary disease, unspecified: Secondary | ICD-10-CM | POA: Diagnosis not present

## 2018-10-07 DIAGNOSIS — R1310 Dysphagia, unspecified: Secondary | ICD-10-CM | POA: Diagnosis not present

## 2018-10-07 DIAGNOSIS — C7951 Secondary malignant neoplasm of bone: Secondary | ICD-10-CM | POA: Diagnosis not present

## 2018-10-07 DIAGNOSIS — C50919 Malignant neoplasm of unspecified site of unspecified female breast: Secondary | ICD-10-CM | POA: Diagnosis not present

## 2018-10-07 DIAGNOSIS — G9340 Encephalopathy, unspecified: Secondary | ICD-10-CM | POA: Diagnosis not present

## 2018-10-07 DIAGNOSIS — J449 Chronic obstructive pulmonary disease, unspecified: Secondary | ICD-10-CM | POA: Diagnosis not present

## 2018-10-07 DIAGNOSIS — I4891 Unspecified atrial fibrillation: Secondary | ICD-10-CM | POA: Diagnosis not present

## 2018-10-10 DIAGNOSIS — C50919 Malignant neoplasm of unspecified site of unspecified female breast: Secondary | ICD-10-CM | POA: Diagnosis not present

## 2018-10-10 DIAGNOSIS — I4891 Unspecified atrial fibrillation: Secondary | ICD-10-CM | POA: Diagnosis not present

## 2018-10-10 DIAGNOSIS — R1310 Dysphagia, unspecified: Secondary | ICD-10-CM | POA: Diagnosis not present

## 2018-10-10 DIAGNOSIS — J449 Chronic obstructive pulmonary disease, unspecified: Secondary | ICD-10-CM | POA: Diagnosis not present

## 2018-10-10 DIAGNOSIS — G9340 Encephalopathy, unspecified: Secondary | ICD-10-CM | POA: Diagnosis not present

## 2018-10-10 DIAGNOSIS — C7951 Secondary malignant neoplasm of bone: Secondary | ICD-10-CM | POA: Diagnosis not present

## 2018-10-19 DIAGNOSIS — N183 Chronic kidney disease, stage 3 (moderate): Secondary | ICD-10-CM | POA: Diagnosis not present

## 2018-10-19 DIAGNOSIS — G9340 Encephalopathy, unspecified: Secondary | ICD-10-CM | POA: Diagnosis not present

## 2018-10-19 DIAGNOSIS — I4891 Unspecified atrial fibrillation: Secondary | ICD-10-CM | POA: Diagnosis not present

## 2018-10-19 DIAGNOSIS — C7951 Secondary malignant neoplasm of bone: Secondary | ICD-10-CM | POA: Diagnosis not present

## 2018-10-19 DIAGNOSIS — J301 Allergic rhinitis due to pollen: Secondary | ICD-10-CM | POA: Diagnosis not present

## 2018-10-19 DIAGNOSIS — R1310 Dysphagia, unspecified: Secondary | ICD-10-CM | POA: Diagnosis not present

## 2018-10-19 DIAGNOSIS — J449 Chronic obstructive pulmonary disease, unspecified: Secondary | ICD-10-CM | POA: Diagnosis not present

## 2018-10-19 DIAGNOSIS — D630 Anemia in neoplastic disease: Secondary | ICD-10-CM | POA: Diagnosis not present

## 2018-10-19 DIAGNOSIS — K219 Gastro-esophageal reflux disease without esophagitis: Secondary | ICD-10-CM | POA: Diagnosis not present

## 2018-10-19 DIAGNOSIS — C50919 Malignant neoplasm of unspecified site of unspecified female breast: Secondary | ICD-10-CM | POA: Diagnosis not present

## 2018-10-19 DIAGNOSIS — K589 Irritable bowel syndrome without diarrhea: Secondary | ICD-10-CM | POA: Diagnosis not present

## 2018-10-20 DIAGNOSIS — I4891 Unspecified atrial fibrillation: Secondary | ICD-10-CM | POA: Diagnosis not present

## 2018-10-20 DIAGNOSIS — C50919 Malignant neoplasm of unspecified site of unspecified female breast: Secondary | ICD-10-CM | POA: Diagnosis not present

## 2018-10-20 DIAGNOSIS — R1310 Dysphagia, unspecified: Secondary | ICD-10-CM | POA: Diagnosis not present

## 2018-10-20 DIAGNOSIS — J449 Chronic obstructive pulmonary disease, unspecified: Secondary | ICD-10-CM | POA: Diagnosis not present

## 2018-10-20 DIAGNOSIS — G9340 Encephalopathy, unspecified: Secondary | ICD-10-CM | POA: Diagnosis not present

## 2018-10-20 DIAGNOSIS — C7951 Secondary malignant neoplasm of bone: Secondary | ICD-10-CM | POA: Diagnosis not present

## 2018-10-24 ENCOUNTER — Encounter: Payer: Self-pay | Admitting: Nurse Practitioner

## 2018-10-24 ENCOUNTER — Non-Acute Institutional Stay (SKILLED_NURSING_FACILITY): Payer: Medicare Other | Admitting: Nurse Practitioner

## 2018-10-24 DIAGNOSIS — F419 Anxiety disorder, unspecified: Secondary | ICD-10-CM | POA: Diagnosis not present

## 2018-10-24 DIAGNOSIS — R627 Adult failure to thrive: Secondary | ICD-10-CM

## 2018-10-24 DIAGNOSIS — R413 Other amnesia: Secondary | ICD-10-CM

## 2018-10-24 DIAGNOSIS — I4891 Unspecified atrial fibrillation: Secondary | ICD-10-CM

## 2018-10-24 DIAGNOSIS — G9340 Encephalopathy, unspecified: Secondary | ICD-10-CM | POA: Diagnosis not present

## 2018-10-24 DIAGNOSIS — F329 Major depressive disorder, single episode, unspecified: Secondary | ICD-10-CM | POA: Diagnosis not present

## 2018-10-24 DIAGNOSIS — C7951 Secondary malignant neoplasm of bone: Secondary | ICD-10-CM | POA: Diagnosis not present

## 2018-10-24 DIAGNOSIS — F32A Depression, unspecified: Secondary | ICD-10-CM

## 2018-10-24 DIAGNOSIS — I10 Essential (primary) hypertension: Secondary | ICD-10-CM | POA: Diagnosis not present

## 2018-10-24 DIAGNOSIS — K5901 Slow transit constipation: Secondary | ICD-10-CM | POA: Diagnosis not present

## 2018-10-24 DIAGNOSIS — M316 Other giant cell arteritis: Secondary | ICD-10-CM | POA: Diagnosis not present

## 2018-10-24 DIAGNOSIS — C50919 Malignant neoplasm of unspecified site of unspecified female breast: Secondary | ICD-10-CM | POA: Diagnosis not present

## 2018-10-24 DIAGNOSIS — J449 Chronic obstructive pulmonary disease, unspecified: Secondary | ICD-10-CM | POA: Diagnosis not present

## 2018-10-24 DIAGNOSIS — R1310 Dysphagia, unspecified: Secondary | ICD-10-CM | POA: Diagnosis not present

## 2018-10-24 NOTE — Assessment & Plan Note (Signed)
Stable, continue Prednisone 5mg  qd.

## 2018-10-24 NOTE — Progress Notes (Signed)
Location:  [facility] Room Number: [place]:  SNF (31) Provider:  Cecely Rengel, Lennie Odor  NP  Promise Weldin X, NP  Patient Care Team: Arriah Wadle X, NP as PCP - General (Internal Medicine) Fanny Skates, MD as Consulting Physician (General Surgery) [name], [name], MD as Consulting Physician (Oncology) [name], MD as Consulting Physician (Radiation Oncology) Rockwell Germany, RN as Registered Nurse Mauro Kaufmann, RN as Registered Nurse Gentry Fitz, MD as Consulting Physician (Family Medicine) Yarielis Funaro X, NP as Nurse Practitioner (Internal Medicine) Thompson Grayer, MD as Consulting Physician (Cardiology)  Extended Emergency Contact Information Primary Emergency Contact: Matthews,[name] Address: [address]          [place], [place] [address] [place] Phone: [phone] Relation: Daughter Secondary Emergency Contact: Clent Demark [place] Phone: [phone] Relation: Other  Code Status:  DNR Goals of care: Advanced Directive information Advanced Directives 10/24/2018  Does Patient Have a Medical Advance Directive? Yes  Type of Paramedic of [place];Out of facility DNR (pink MOST or yellow form)  Does patient want to make changes to medical advance directive? No - Patient declined  Copy of [place] in Chart? Yes - validated most recent copy scanned in chart (See row information)  Would patient like information on creating a medical advance directive? -  Pre-existing out of facility DNR order (yellow form or pink MOST form) Yellow form placed in chart (order not valid for inpatient use)     Chief Complaint  Patient presents with  . Medical Management of Chronic Issues    HPI:  Pt is a 85 y.o. female seen today for medical management of chronic diseases.     The patient resides in memory care unit, [facility] for safety and care assistance, her mood  is stabilized on Quetiapine 25mg  qd, Mirtazapine 15mg  qd, Lorazepam 0.5mg  bid. No constipation while taking Senokot S qd, MOM prn. Giant cell arteritis is stable on Prednisone 5mg  qd. Blood pressure, heart rate are controlled on Metoprolol 50mg  qd.    Past Medical History:  Diagnosis Date  . Asthma    "as a child"  . Breast cancer ([place]) 12/08/2014   Bilateral  . Cancer of central portion of female breast ([place]) 12/04/2014  . Cancer of central portion of female breast ([place]) 12/04/2014  . Complete heart block ([place]) 1999   s/p PPM by Dr Olevia Perches, most recent generator change 2009  . Complication of anesthesia   . COPD (chronic obstructive pulmonary disease) ([place])   . Dementia ([place])   . Diverticulosis   . DJD (degenerative joint disease)    RA  . Esophageal stricture   . Family history of adverse reaction to anesthesia    Daughter N/V  . GERD (gastroesophageal reflux disease)   . Giant cell arteritis ([place])   . Goiter   . Hemorrhoids   . Hiatal hernia   . IBS (irritable bowel syndrome)   . Paroxysmal atrial fibrillation ([place]) 12/17/2015   asymptomatic, <1%, determined on regular pacemaker interrogation  . Pneumonia   . PONV (postoperative nausea and vomiting)   . S/P radiation therapy 04/24/15 completed   T-11  . Wears glasses    Past Surgical History:  Procedure Laterality Date  . ANAL FISSURE REPAIR    . ARTERY BIOPSY Left 01/03/2013   Procedure: BIOPSY LEFT TEMPORAL ARTERY;  Surgeon: Ascencion Dike, MD;  Location: [facility];  Service: ENT;  Laterality: Left;  . BREAST BIOPSY Right   . CHOLECYSTECTOMY    . COLONOSCOPY    . FRACTURE SURGERY     fx lt wrist  . HEMORRHOID SURGERY    . INSERT / REPLACE / [place]   Gen change ([facility]) by Dr Olevia Perches 2009  . IR FLUORO GUIDE PORT INSERTION RIGHT  12/22/2017  . IR US GUIDE VASC ACCESS RIGHT  12/22/2017  . LUNG REMOVAL, PARTIAL  1950   left lower lobectomy for brochiectasis   . MASTECTOMY Left 02/08/2018  . MOUTH  SURGERY     patient unaware  . RECTAL POLYPECTOMY    . RENAL CYST EXCISION    . TONSILLECTOMY    . TOTAL MASTECTOMY Left 02/08/2018  . TOTAL MASTECTOMY Left 02/08/2018   Procedure: TOTAL MASTECTOMY LEFT;  Surgeon: Fanny Skates, MD;  Location: [facility];  Service: General;  Laterality: Left;    Allergies  Allergen Reactions  . Amoxicillin-Pot Clavulanate Other (See Comments)    Upset stomach Has patient had a PCN reaction causing immediate rash, facial/tongue/throat swelling, SOB or lightheadedness with hypotension: No Has patient had a PCN reaction causing severe rash involving mucus membranes or skin necrosis: No Has patient had a PCN reaction that required hospitalization: No Has patient had a PCN reaction occurring within the last 10 years: No If all of the above answers are "NO", then may proceed with Cephalosporin use.   [place] Kitchen Morphine And Related Nausea Only    Outpatient Encounter Medications as of 10/24/2018  Medication Sig  . LORazepam (ATIVAN) 0.5 MG tablet Take 0.5 mg by mouth 2 (two) times daily. Take twice daily at 8:00am  and 5pm  . magnesium hydroxide (MILK OF MAGNESIA) 400 MG/5ML suspension Take 30 mLs by mouth daily as needed for mild constipation.  . Menthol, Topical Analgesic, (BIOFREEZE) 4 % GEL Apply small amt. to the back of the neck and down the to the mid back. Apply Biofreeze to neck, shoulders and upper back PRN for pain four times a day  . metoprolol succinate (TOPROL-XL) 50 MG 24 hr tablet Take 50 mg by mouth daily. Take with or immediately following a meal.  . mirtazapine (REMERON) 15 MG tablet Take 15 mg by mouth at bedtime.   . mupirocin ointment (BACTROBAN) 2 % Apply 1 application topically 2 (two) times daily as needed. Apply to right ear  . ondansetron (ZOFRAN) 4 MG tablet Take 4 mg by mouth 3 (three) times daily.   . predniSONE (DELTASONE) 5 MG tablet Take 5 mg by mouth daily with breakfast.  . QUEtiapine (SEROQUEL) 25 MG tablet Take 25 mg by mouth at  bedtime.  . sennosides-docusate sodium (SENOKOT-S) 8.6-50 MG tablet Take 1 tablet by mouth at bedtime.  . [DISCONTINUED] feeding supplement (BOOST / RESOURCE BREEZE) LIQD Take 1 Container by mouth 3 (three) times daily with meals.    No facility-administered encounter medications on file as of 10/24/2018.    ROS was provided with assistance of staff Review of Systems  Constitutional: Negative for activity change, appetite change, chills, diaphoresis, fatigue, fever and unexpected weight change.  HENT: Positive for hearing loss. Negative for congestion, sinus pain and voice change.   Respiratory: Negative for cough, shortness of breath and wheezing.   Cardiovascular: Negative for chest pain, palpitations and leg swelling.  Gastrointestinal: Negative for abdominal distention, abdominal pain, constipation, diarrhea, nausea and vomiting.  Genitourinary: Negative for difficulty urinating, dysuria and urgency.  Musculoskeletal: Positive for arthralgias and  gait problem.  Skin: Negative for color change and pallor.  Neurological: Negative for dizziness, speech difficulty, weakness and headaches.       Dementia  Psychiatric/Behavioral: Positive for confusion. Negative for agitation, behavioral problems, hallucinations and sleep disturbance. The patient is not nervous/anxious.     Immunization History  Administered Date(s) Administered  . Influenza Split 07/24/2011, 06/20/2012  . Influenza Whole 10/19/2001, 07/25/2007, 08/02/2008, 07/18/2009, 07/10/2010, 07/21/2018  . Influenza,inj,Quad PF,6+ Mos 07/18/2013, 08/27/2014  . Influenza-Unspecified 07/31/2015, 08/19/2016, 08/09/2017  . Pneumococcal Conjugate-13 08/16/2017  . Pneumococcal Polysaccharide-23 10/19/1997, 01/17/2010  . Td 10/19/1996, 07/25/2007   Pertinent  Health Maintenance Due  Topic Date Due  . INFLUENZA VACCINE  Completed  . DEXA SCAN  Completed  . PNA vac Low Risk Adult  Completed   Fall Risk  07/05/2018 07/01/2017 02/22/2017  05/22/2015 03/22/2015  Falls in the past year? No No No No No   Functional Status Survey:    Vitals:   10/24/18 1454  BP: (!) 150/78  Pulse: 80  Resp: 20  Temp: 98 F (36.7 C)  SpO2: 97%  Weight: 108 lb 9.6 oz (49.3 kg)  Height: 5\' 1"  (1.549 m)   Body mass index is 20.52 kg/m. Physical Exam Constitutional:      General: She is not in acute distress.    Appearance: Normal appearance. She is normal weight. She is not ill-appearing, toxic-appearing or diaphoretic.  HENT:     Head: Normocephalic and atraumatic.     Nose: Nose normal.     Mouth/Throat:     Mouth: Mucous membranes are moist.  Eyes:     Extraocular Movements: Extraocular movements intact.     Pupils: Pupils are equal, round, and reactive to light.  Neck:     Musculoskeletal: Normal range of motion and neck supple.  Cardiovascular:     Rate and Rhythm: Normal rate and regular rhythm.     Heart sounds: Murmur present.  Pulmonary:     Breath sounds: No wheezing, rhonchi or rales.     Comments: Decreased air entry left lung Abdominal:     General: There is no distension.     Palpations: Abdomen is soft.     Tenderness: There is no abdominal tenderness. There is no guarding or rebound.  Musculoskeletal:     Right lower leg: No edema.     Left lower leg: No edema.     Comments: Ambulates with walker.   Skin:    General: Skin is warm and dry.  Neurological:     General: No focal deficit present.     Mental Status: She is alert. Mental status is at baseline.     Cranial Nerves: No cranial nerve deficit.     Motor: No weakness.     Coordination: Coordination normal.     Gait: Gait abnormal.     Comments: Oriented to person and place  Psychiatric:        Mood and Affect: Mood normal.        Behavior: Behavior normal.     Labs reviewed: Recent Labs    03/30/18 1327 03/31/18 0406 04/01/18 0349 04/26/18 05/02/18 06/09/18  NA 128* 134* 135 132* 136* 141  K 3.0* 4.9 3.8 4.0 4.6 3.9  CL 93* 102 107 95 99  104  CO2 26 20* 23 30 32 32  GLUCOSE 107* 153* 102*  --   --   --   BUN 5* 6 9 15 10 14   CREATININE 0.64 0.76 0.64 0.8  0.8 0.7  CALCIUM 8.4* 8.5* 7.7* 8.0 8.9 8.4  MG  --  1.8  --   --   --   --    Recent Labs    02/17/18 03/04/18 1034  03/30/18 1327 04/26/18 06/09/18  AST 37 37*   < > 46* 28 23  ALT 32 35   < > 34 20 17  ALKPHOS 71 92   < > 69 70 50  BILITOT 0.7 0.6  --  0.7  --   --   PROT  --  6.6  --  6.1* 5.1 5.0  ALBUMIN 3.7 3.3*   < > 3.0* 3.0 2.9   < > = values in this interval not displayed.   Recent Labs    02/03/18 1344  03/04/18 1034  03/30/18 1327 03/31/18 0406 04/01/18 0349 04/26/18 06/09/18  WBC 7.1   < > 5.8   < > 8.8 8.2 9.5 10.0 6.6  NEUTROABS 6.0  --  4.0  --  6.1  --   --   --   --   HGB 12.6   < > 12.1   < > 12.3 12.5 10.8* 11.4* 11.6*  HCT 39.5   < > 37.1   < > 36.4 37.2 32.6* 34* 35*  MCV 89.4   < > 85.9  --  81.8 81.6 83.0  --   --   PLT 225   < > 198   < > 188 247 223 284 304   < > = values in this interval not displayed.   Lab Results  Component Value Date   TSH 0.719 03/31/2018   Lab Results  Component Value Date   HGBA1C 6.1 (H) 02/09/2018   Lab Results  Component Value Date   CHOL 194 07/25/2007   HDL 62.7 07/25/2007   LDLCALC 118 (H) 07/25/2007   TRIG 69 07/25/2007   CHOLHDL 3.1 CALC 07/25/2007    Significant Diagnostic Results in last 30 days:  No results found.  Assessment/Plan Atrial fibrillation, new onset (HCC) Heart rate is in control, continue Metoprolol 50mg  qd.   Essential hypertension Blood pressure is controlled, continue Metoprolol 50mg  qd.   Giant cell arteritis Stable, continue Prednisone 5mg  qd.   Slow transit constipation Stable, continue Senokot S qd, prn MOM  Adult failure to thrive Stabilizing.   Memory loss Continue Memory Care Unit for safety and care assistance.   Anxiety and depression Her mood is stable, continue Mirtazapine 15mg  qd, Quetiapine 25mg  qd, Lorazepam 0.5mg  bid.       Family/ staff Communication: plan of care reviewed with the patient and charge nurse.   Labs/tests ordered: none  Time spend 25 minutes.

## 2018-10-24 NOTE — Assessment & Plan Note (Signed)
Blood pressure is controlled, continue Metoprolol 50mg  qd.

## 2018-10-24 NOTE — Assessment & Plan Note (Signed)
Heart rate is in control, continue Metoprolol 50mg  qd.

## 2018-10-24 NOTE — Assessment & Plan Note (Signed)
Continue Memory Care Unit for safety and care assistance.

## 2018-10-24 NOTE — Assessment & Plan Note (Signed)
Her mood is stable, continue Mirtazapine 15mg  qd, Quetiapine 25mg  qd, Lorazepam 0.5mg  bid.

## 2018-10-24 NOTE — Assessment & Plan Note (Signed)
Stabilizing.

## 2018-10-24 NOTE — Assessment & Plan Note (Signed)
Stable, continue Senokot S qd, prn MOM

## 2018-11-07 DIAGNOSIS — C7951 Secondary malignant neoplasm of bone: Secondary | ICD-10-CM | POA: Diagnosis not present

## 2018-11-07 DIAGNOSIS — R1310 Dysphagia, unspecified: Secondary | ICD-10-CM | POA: Diagnosis not present

## 2018-11-07 DIAGNOSIS — J449 Chronic obstructive pulmonary disease, unspecified: Secondary | ICD-10-CM | POA: Diagnosis not present

## 2018-11-07 DIAGNOSIS — C50919 Malignant neoplasm of unspecified site of unspecified female breast: Secondary | ICD-10-CM | POA: Diagnosis not present

## 2018-11-07 DIAGNOSIS — I4891 Unspecified atrial fibrillation: Secondary | ICD-10-CM | POA: Diagnosis not present

## 2018-11-07 DIAGNOSIS — G9340 Encephalopathy, unspecified: Secondary | ICD-10-CM | POA: Diagnosis not present

## 2018-11-08 DIAGNOSIS — C7951 Secondary malignant neoplasm of bone: Secondary | ICD-10-CM | POA: Diagnosis not present

## 2018-11-08 DIAGNOSIS — C50919 Malignant neoplasm of unspecified site of unspecified female breast: Secondary | ICD-10-CM | POA: Diagnosis not present

## 2018-11-08 DIAGNOSIS — J449 Chronic obstructive pulmonary disease, unspecified: Secondary | ICD-10-CM | POA: Diagnosis not present

## 2018-11-08 DIAGNOSIS — L602 Onychogryphosis: Secondary | ICD-10-CM | POA: Diagnosis not present

## 2018-11-08 DIAGNOSIS — Q6689 Other  specified congenital deformities of feet: Secondary | ICD-10-CM | POA: Diagnosis not present

## 2018-11-08 DIAGNOSIS — G9340 Encephalopathy, unspecified: Secondary | ICD-10-CM | POA: Diagnosis not present

## 2018-11-08 DIAGNOSIS — I4891 Unspecified atrial fibrillation: Secondary | ICD-10-CM | POA: Diagnosis not present

## 2018-11-08 DIAGNOSIS — R1310 Dysphagia, unspecified: Secondary | ICD-10-CM | POA: Diagnosis not present

## 2018-11-10 ENCOUNTER — Other Ambulatory Visit: Payer: Self-pay | Admitting: *Deleted

## 2018-11-10 DIAGNOSIS — J449 Chronic obstructive pulmonary disease, unspecified: Secondary | ICD-10-CM | POA: Diagnosis not present

## 2018-11-10 DIAGNOSIS — F419 Anxiety disorder, unspecified: Principal | ICD-10-CM

## 2018-11-10 DIAGNOSIS — C7951 Secondary malignant neoplasm of bone: Secondary | ICD-10-CM | POA: Diagnosis not present

## 2018-11-10 DIAGNOSIS — F32A Depression, unspecified: Secondary | ICD-10-CM

## 2018-11-10 DIAGNOSIS — G9340 Encephalopathy, unspecified: Secondary | ICD-10-CM | POA: Diagnosis not present

## 2018-11-10 DIAGNOSIS — R1310 Dysphagia, unspecified: Secondary | ICD-10-CM | POA: Diagnosis not present

## 2018-11-10 DIAGNOSIS — C50919 Malignant neoplasm of unspecified site of unspecified female breast: Secondary | ICD-10-CM | POA: Diagnosis not present

## 2018-11-10 DIAGNOSIS — I4891 Unspecified atrial fibrillation: Secondary | ICD-10-CM | POA: Diagnosis not present

## 2018-11-10 DIAGNOSIS — F329 Major depressive disorder, single episode, unspecified: Secondary | ICD-10-CM

## 2018-11-10 MED ORDER — LORAZEPAM 0.5 MG PO TABS: 0.5000 mg | ORAL_TABLET | Freq: Two times a day (BID) | ORAL | 0 refills | Status: DC

## 2018-11-16 DIAGNOSIS — C50919 Malignant neoplasm of unspecified site of unspecified female breast: Secondary | ICD-10-CM | POA: Diagnosis not present

## 2018-11-16 DIAGNOSIS — I4891 Unspecified atrial fibrillation: Secondary | ICD-10-CM | POA: Diagnosis not present

## 2018-11-16 DIAGNOSIS — J449 Chronic obstructive pulmonary disease, unspecified: Secondary | ICD-10-CM | POA: Diagnosis not present

## 2018-11-16 DIAGNOSIS — R1310 Dysphagia, unspecified: Secondary | ICD-10-CM | POA: Diagnosis not present

## 2018-11-16 DIAGNOSIS — G9340 Encephalopathy, unspecified: Secondary | ICD-10-CM | POA: Diagnosis not present

## 2018-11-16 DIAGNOSIS — C7951 Secondary malignant neoplasm of bone: Secondary | ICD-10-CM | POA: Diagnosis not present

## 2018-11-19 DIAGNOSIS — R1310 Dysphagia, unspecified: Secondary | ICD-10-CM | POA: Diagnosis not present

## 2018-11-19 DIAGNOSIS — K589 Irritable bowel syndrome without diarrhea: Secondary | ICD-10-CM | POA: Diagnosis not present

## 2018-11-19 DIAGNOSIS — K219 Gastro-esophageal reflux disease without esophagitis: Secondary | ICD-10-CM | POA: Diagnosis not present

## 2018-11-19 DIAGNOSIS — G9340 Encephalopathy, unspecified: Secondary | ICD-10-CM | POA: Diagnosis not present

## 2018-11-19 DIAGNOSIS — C7951 Secondary malignant neoplasm of bone: Secondary | ICD-10-CM | POA: Diagnosis not present

## 2018-11-19 DIAGNOSIS — I4891 Unspecified atrial fibrillation: Secondary | ICD-10-CM | POA: Diagnosis not present

## 2018-11-19 DIAGNOSIS — C50919 Malignant neoplasm of unspecified site of unspecified female breast: Secondary | ICD-10-CM | POA: Diagnosis not present

## 2018-11-19 DIAGNOSIS — J449 Chronic obstructive pulmonary disease, unspecified: Secondary | ICD-10-CM | POA: Diagnosis not present

## 2018-11-19 DIAGNOSIS — N183 Chronic kidney disease, stage 3 (moderate): Secondary | ICD-10-CM | POA: Diagnosis not present

## 2018-11-19 DIAGNOSIS — J301 Allergic rhinitis due to pollen: Secondary | ICD-10-CM | POA: Diagnosis not present

## 2018-11-19 DIAGNOSIS — D630 Anemia in neoplastic disease: Secondary | ICD-10-CM | POA: Diagnosis not present

## 2018-11-22 DIAGNOSIS — C50919 Malignant neoplasm of unspecified site of unspecified female breast: Secondary | ICD-10-CM | POA: Diagnosis not present

## 2018-11-22 DIAGNOSIS — C7951 Secondary malignant neoplasm of bone: Secondary | ICD-10-CM | POA: Diagnosis not present

## 2018-11-22 DIAGNOSIS — G9340 Encephalopathy, unspecified: Secondary | ICD-10-CM | POA: Diagnosis not present

## 2018-11-22 DIAGNOSIS — R1310 Dysphagia, unspecified: Secondary | ICD-10-CM | POA: Diagnosis not present

## 2018-11-22 DIAGNOSIS — J449 Chronic obstructive pulmonary disease, unspecified: Secondary | ICD-10-CM | POA: Diagnosis not present

## 2018-11-22 DIAGNOSIS — I4891 Unspecified atrial fibrillation: Secondary | ICD-10-CM | POA: Diagnosis not present

## 2018-11-28 ENCOUNTER — Non-Acute Institutional Stay (SKILLED_NURSING_FACILITY): Payer: Medicare Other | Admitting: Nurse Practitioner

## 2018-11-28 ENCOUNTER — Encounter: Payer: Self-pay | Admitting: Nurse Practitioner

## 2018-11-28 DIAGNOSIS — F419 Anxiety disorder, unspecified: Secondary | ICD-10-CM

## 2018-11-28 DIAGNOSIS — I10 Essential (primary) hypertension: Secondary | ICD-10-CM

## 2018-11-28 DIAGNOSIS — R627 Adult failure to thrive: Secondary | ICD-10-CM

## 2018-11-28 DIAGNOSIS — K5901 Slow transit constipation: Secondary | ICD-10-CM

## 2018-11-28 DIAGNOSIS — F329 Major depressive disorder, single episode, unspecified: Secondary | ICD-10-CM

## 2018-11-28 DIAGNOSIS — K219 Gastro-esophageal reflux disease without esophagitis: Secondary | ICD-10-CM

## 2018-11-28 DIAGNOSIS — M159 Polyosteoarthritis, unspecified: Secondary | ICD-10-CM

## 2018-11-28 DIAGNOSIS — I4891 Unspecified atrial fibrillation: Secondary | ICD-10-CM | POA: Diagnosis not present

## 2018-11-28 DIAGNOSIS — M316 Other giant cell arteritis: Secondary | ICD-10-CM | POA: Diagnosis not present

## 2018-11-28 NOTE — Assessment & Plan Note (Addendum)
Heart burn, indigestion, will change Zofran 4mg  tid prn. Starts Omeprazole 20mg  qd. Observe.

## 2018-11-28 NOTE — Assessment & Plan Note (Signed)
Heart rate is in control, continue Metoprolol 50mg  qd.

## 2018-11-28 NOTE — Assessment & Plan Note (Signed)
Stable, regaining body weight about #4-5Ibs in the past 3 months. Observe.

## 2018-11-28 NOTE — Assessment & Plan Note (Signed)
Her mood is stable, continue Mirtazapine 15mg  qd, Lorazepam 0.5mg  bid, Quetiapine 25mg  qd.

## 2018-11-28 NOTE — Assessment & Plan Note (Signed)
Stable, continue Prednisone 5mg  qd.

## 2018-11-28 NOTE — Assessment & Plan Note (Signed)
Blood pressure is in control, continue Metoprolol 50mg  qd.

## 2018-11-28 NOTE — Assessment & Plan Note (Signed)
Chronic lower back pain, continue Tylenol, may consider X-ray of lumbar spine/pelvis if not better.

## 2018-11-28 NOTE — Progress Notes (Signed)
Location:  [place] Room Number: [place]:  SNF (31) Provider:  Steele Stracener, Lennie Odor  NP  Brilyn Tuller X, NP  Patient Care Team: Timberlyn Pickford X, NP as PCP - General (Internal Medicine) Fanny Skates, MD as Consulting Physician (General Surgery) [name], [name], MD as Consulting Physician (Oncology) [name], MD as Consulting Physician (Radiation Oncology) Rockwell Germany, RN as Registered Nurse Mauro Kaufmann, RN as Registered Nurse Gentry Fitz, MD as Consulting Physician (Family Medicine) Mackenna Kamer X, NP as Nurse Practitioner (Internal Medicine) Thompson Grayer, MD as Consulting Physician (Cardiology)  Extended Emergency Contact Information Primary Emergency Contact: Matthews,[name] Address: [address]          [place], [place] [address] [place] Phone: [phone] Relation: Daughter Secondary Emergency Contact: Clent Demark [place] Phone: [phone] Relation: Other  Code Status:  DNR Goals of care: Advanced Directive information Advanced Directives 11/28/2018  Does Patient Have a Medical Advance Directive? Yes  Type of Paramedic of [place];Out of facility DNR (pink MOST or yellow form)  Does patient want to make changes to medical advance directive? No - Patient declined  Copy of [place] in Chart? Yes - validated most recent copy scanned in chart (See row information)  Would patient like information on creating a medical advance directive? -  Pre-existing out of facility DNR order (yellow form or pink MOST form) Yellow form placed in chart (order not valid for inpatient use)     Chief Complaint  Patient presents with  . Medical Management of Chronic Issues    HPI:  Pt is a 85 y.o. female seen today for medical management of chronic diseases.     The patient resides in [facility] for safety and care assistance, she has  regaining her weights, #4-5Ibs/month for the past 3 months. No s/s of fluid overload. Hx of constipation, stable on Senokot S I qhs, MOM 90ml qd prn.  Her mood is stable on Mirtazapine 15mg  qd, Quetiapine 25mg  qd, Lorazepam 0.5mg  bid. Giant cell arteritis, stable on Prednisone 5mg  qd. GERD stable on Zofran 4mg  tid. HTN/Afib, blood pressure/heart rate are controlled on Metoprolol 50mg  qd. Chronic lower back pain, aches, still able to ambulate.    Past Medical History:  Diagnosis Date  . Asthma    "as a child"  . Breast cancer ([place]) 12/08/2014   Bilateral  . Cancer of central portion of female breast ([place]) 12/04/2014  . Cancer of central portion of female breast ([place]) 12/04/2014  . Complete heart block ([place]) 1999   s/p PPM by Dr Olevia Perches, most recent generator change 2009  . Complication of anesthesia   . COPD (chronic obstructive pulmonary disease) ([place])   . Dementia ([place])   . Diverticulosis   . DJD (degenerative joint disease)    RA  . Esophageal stricture   . Family history of adverse reaction to anesthesia    Daughter N/V  . GERD (gastroesophageal reflux disease)   . Giant cell arteritis ([place])   . Goiter   . Hemorrhoids   . Hiatal hernia   . IBS (irritable bowel syndrome)   . Paroxysmal atrial fibrillation ([place]) 12/17/2015   asymptomatic, <1%, determined on regular pacemaker interrogation  . Pneumonia   . PONV (postoperative nausea and vomiting)   . S/P radiation therapy 04/24/15 completed   T-11  . Wears glasses    Past Surgical History:  Procedure Laterality Date  . ANAL FISSURE REPAIR    . ARTERY BIOPSY Left 01/03/2013   Procedure: BIOPSY LEFT TEMPORAL ARTERY;  Surgeon: Ascencion Dike, MD;  Location: [place];  Service: ENT;  Laterality: Left;  . BREAST BIOPSY Right   . CHOLECYSTECTOMY    . COLONOSCOPY    . FRACTURE SURGERY     fx lt wrist  . HEMORRHOID SURGERY    . INSERT / REPLACE / [place]   Gen change (SJM) by Dr Olevia Perches 2009  . IR FLUORO  GUIDE PORT INSERTION RIGHT  12/22/2017  . IR US GUIDE VASC ACCESS RIGHT  12/22/2017  . LUNG REMOVAL, PARTIAL  1950   left lower lobectomy for brochiectasis   . MASTECTOMY Left 02/08/2018  . MOUTH SURGERY     patient unaware  . RECTAL POLYPECTOMY    . RENAL CYST EXCISION    . TONSILLECTOMY    . TOTAL MASTECTOMY Left 02/08/2018  . TOTAL MASTECTOMY Left 02/08/2018   Procedure: TOTAL MASTECTOMY LEFT;  Surgeon: Fanny Skates, MD;  Location: [place];  Service: General;  Laterality: Left;    Allergies  Allergen Reactions  . Amoxicillin-Pot Clavulanate Other (See Comments)    Upset stomach Has patient had a PCN reaction causing immediate rash, facial/tongue/throat swelling, SOB or lightheadedness with hypotension: No Has patient had a PCN reaction causing severe rash involving mucus membranes or skin necrosis: No Has patient had a PCN reaction that required hospitalization: No Has patient had a PCN reaction occurring within the last 10 years: No If all of the above answers are "NO", then may proceed with Cephalosporin use.   Marland Kitchen Morphine And Related Nausea Only    Outpatient Encounter Medications as of 11/28/2018  Medication Sig  . acetaminophen (TYLENOL) 325 MG tablet Take 650 mg by mouth 2 (two) times daily.  Marland Kitchen LORazepam (ATIVAN) 0.5 MG tablet Take 1 tablet (0.5 mg total) by mouth 2 (two) times daily. Take twice daily at 8:00am  and 5pm  . magnesium hydroxide (MILK OF MAGNESIA) 400 MG/5ML suspension Take 30 mLs by mouth daily as needed for mild constipation.  . Menthol, Topical Analgesic, (BIOFREEZE) 4 % GEL Apply small amt. to the back of the neck and down the to the mid back. Apply Biofreeze to neck, shoulders and upper back PRN for pain four times a day  . metoprolol succinate (TOPROL-XL) 50 MG 24 hr tablet Take 50 mg by mouth daily. Take with or immediately following a meal.  . mirtazapine (REMERON) 15 MG tablet Take 15 mg by mouth at bedtime.   . mupirocin ointment (BACTROBAN) 2 % Apply 1  application topically 2 (two) times daily as needed. Apply to right ear  . Nutritional Supplements (RESOURCE 2.0 PO) Take 180 mLs by mouth 3 (three) times daily.  . ondansetron (ZOFRAN) 4 MG tablet Take 4 mg by mouth 3 (three) times daily.   . predniSONE (DELTASONE) 5 MG tablet Take 5 mg by mouth daily with breakfast.  . QUEtiapine (SEROQUEL) 25 MG tablet Take 25 mg by mouth at bedtime.  . sennosides-docusate sodium (SENOKOT-S) 8.6-50 MG tablet Take 1 tablet by mouth at bedtime.   No facility-administered encounter medications on file as of 11/28/2018.     Review of Systems  Constitutional: Negative for activity change, appetite change, chills, diaphoresis, fatigue and fever.       Gradual weight gaining is expected.   HENT: Positive for hearing loss. Negative for congestion and voice change.  Respiratory: Negative for cough, shortness of breath and wheezing.   Cardiovascular: Negative for chest pain, palpitations and leg swelling.  Gastrointestinal: Negative for abdominal distention, abdominal pain, constipation, diarrhea, nausea and vomiting.       C/o heart burns, indigestion.   Genitourinary: Negative for difficulty urinating, dysuria and urgency.  Musculoskeletal: Positive for arthralgias, back pain and gait problem.  Skin: Negative for color change and pallor.  Neurological: Negative for dizziness, speech difficulty, weakness and headaches.       Memory lapses.   Psychiatric/Behavioral: Negative for agitation, behavioral problems, hallucinations and sleep disturbance. The patient is not nervous/anxious.     Immunization History  Administered Date(s) Administered  . Influenza Split 07/24/2011, 06/20/2012  . Influenza Whole 10/19/2001, 07/25/2007, 08/02/2008, 07/18/2009, 07/10/2010, 07/21/2018  . Influenza,inj,Quad PF,6+ Mos 07/18/2013, 08/27/2014  . Influenza-Unspecified 07/31/2015, 08/19/2016, 08/09/2017  . Pneumococcal Conjugate-13 08/16/2017  . Pneumococcal Polysaccharide-23  10/19/1997, 01/17/2010  . Td 10/19/1996, 07/25/2007   Pertinent  Health Maintenance Due  Topic Date Due  . INFLUENZA VACCINE  Completed  . DEXA SCAN  Completed  . PNA vac Low Risk Adult  Completed   Fall Risk  07/05/2018 07/01/2017 02/22/2017 05/22/2015 03/22/2015  Falls in the past year? No No No No No   Functional Status Survey:    Vitals:   11/28/18 1302  BP: 130/80  Pulse: 89  Resp: 18  Temp: 98 F (36.7 C)  SpO2: 97%  Weight: 113 lb 6.4 oz (51.4 kg)  Height: 5\' 1"  (1.549 m)   Body mass index is 21.43 kg/m. Physical Exam Constitutional:      General: She is not in acute distress.    Appearance: She is normal weight. She is not ill-appearing, toxic-appearing or diaphoretic.  HENT:     Head: Normocephalic and atraumatic.     Nose: Nose normal.     Mouth/Throat:     Mouth: Mucous membranes are moist.  Eyes:     Extraocular Movements: Extraocular movements intact.     Pupils: Pupils are equal, round, and reactive to light.  Cardiovascular:     Rate and Rhythm: Normal rate and regular rhythm.     Heart sounds: Murmur present.  Pulmonary:     Effort: Pulmonary effort is normal.     Breath sounds: No wheezing, rhonchi or rales.  Abdominal:     General: Bowel sounds are normal. There is no distension.     Palpations: Abdomen is soft.     Tenderness: There is no abdominal tenderness. There is no guarding or rebound.  Musculoskeletal:     Right lower leg: No edema.     Left lower leg: No edema.     Comments: Furniture walking. No lumbar spinous process tenderness palpated.   Skin:    General: Skin is warm and dry.     Coloration: Skin is not jaundiced.     Findings: No bruising, erythema or lesion.  Neurological:     General: No focal deficit present.     Mental Status: She is alert. Mental status is at baseline.     Cranial Nerves: No cranial nerve deficit.     Motor: No weakness.     Coordination: Coordination normal.     Gait: Gait abnormal.     Comments:  Oriented to person and place.   Psychiatric:        Mood and Affect: Mood normal.        Behavior: Behavior normal.     Labs reviewed: Recent Labs  03/30/18 1327 03/31/18 0406 04/01/18 0349 04/26/18 05/02/18 06/09/18  NA 128* 134* 135 132* 136* 141  K 3.0* 4.9 3.8 4.0 4.6 3.9  CL 93* 102 107 95 99 104  CO2 26 20* 23 30 32 32  GLUCOSE 107* 153* 102*  --   --   --   BUN 5* 6 9 15 10 14   CREATININE 0.64 0.76 0.64 0.8 0.8 0.7  CALCIUM 8.4* 8.5* 7.7* 8.0 8.9 8.4  MG  --  1.8  --   --   --   --    Recent Labs    02/17/18 03/04/18 1034  03/30/18 1327 04/26/18 06/09/18  AST 37 37*   < > 46* 28 23  ALT 32 35   < > 34 20 17  ALKPHOS 71 92   < > 69 70 50  BILITOT 0.7 0.6  --  0.7  --   --   PROT  --  6.6  --  6.1* 5.1 5.0  ALBUMIN 3.7 3.3*   < > 3.0* 3.0 2.9   < > = values in this interval not displayed.   Recent Labs    02/03/18 1344  03/04/18 1034  03/30/18 1327 03/31/18 0406 04/01/18 0349 04/26/18 06/09/18  WBC 7.1   < > 5.8   < > 8.8 8.2 9.5 10.0 6.6  NEUTROABS 6.0  --  4.0  --  6.1  --   --   --   --   HGB 12.6   < > 12.1   < > 12.3 12.5 10.8* 11.4* 11.6*  HCT 39.5   < > 37.1   < > 36.4 37.2 32.6* 34* 35*  MCV 89.4   < > 85.9  --  81.8 81.6 83.0  --   --   PLT 225   < > 198   < > 188 247 223 284 304   < > = values in this interval not displayed.   Lab Results  Component Value Date   TSH 0.719 03/31/2018   Lab Results  Component Value Date   HGBA1C 6.1 (H) 02/09/2018   Lab Results  Component Value Date   CHOL 194 07/25/2007   HDL 62.7 07/25/2007   LDLCALC 118 (H) 07/25/2007   TRIG 69 07/25/2007   CHOLHDL 3.1 CALC 07/25/2007    Significant Diagnostic Results in last 30 days:  No results found.  Assessment/Plan Atrial fibrillation, new onset (HCC) Heart rate is in control, continue Metoprolol 50mg  qd.   Essential hypertension Blood pressure is in control, continue Metoprolol 50mg  qd.   Giant cell arteritis Stable, continue Prednisone 5mg  qd.    Slow transit constipation Stable, continue Senokot S I qh, MOM daily prn.   Generalized osteoarthritis of multiple sites Chronic lower back pain, continue Tylenol, may consider X-ray of lumbar spine/pelvis if not better.   Adult failure to thrive Stable, regaining body weight about #4-5Ibs in the past 3 months. Observe.   Anxiety and depression Her mood is stable, continue Mirtazapine 15mg  qd, Lorazepam 0.5mg  bid, Quetiapine 25mg  qd.   GERD (gastroesophageal reflux disease) Heart burn, indigestion, will change Zofran 4mg  tid prn. Starts Omeprazole 20mg  qd. Observe.      Family/ staff Communication: plan of care reviewed with the patient and charge nurse.   Labs/tests ordered:  none  Time spend 25 minutes.

## 2018-11-28 NOTE — Assessment & Plan Note (Signed)
Stable, continue Senokot S I qh, MOM daily prn.

## 2018-11-29 DIAGNOSIS — C7951 Secondary malignant neoplasm of bone: Secondary | ICD-10-CM | POA: Diagnosis not present

## 2018-11-29 DIAGNOSIS — R1310 Dysphagia, unspecified: Secondary | ICD-10-CM | POA: Diagnosis not present

## 2018-11-29 DIAGNOSIS — J449 Chronic obstructive pulmonary disease, unspecified: Secondary | ICD-10-CM | POA: Diagnosis not present

## 2018-11-29 DIAGNOSIS — C50919 Malignant neoplasm of unspecified site of unspecified female breast: Secondary | ICD-10-CM | POA: Diagnosis not present

## 2018-11-29 DIAGNOSIS — I4891 Unspecified atrial fibrillation: Secondary | ICD-10-CM | POA: Diagnosis not present

## 2018-11-29 DIAGNOSIS — G9340 Encephalopathy, unspecified: Secondary | ICD-10-CM | POA: Diagnosis not present

## 2018-12-02 DIAGNOSIS — R1310 Dysphagia, unspecified: Secondary | ICD-10-CM | POA: Diagnosis not present

## 2018-12-02 DIAGNOSIS — J449 Chronic obstructive pulmonary disease, unspecified: Secondary | ICD-10-CM | POA: Diagnosis not present

## 2018-12-02 DIAGNOSIS — I4891 Unspecified atrial fibrillation: Secondary | ICD-10-CM | POA: Diagnosis not present

## 2018-12-02 DIAGNOSIS — G9340 Encephalopathy, unspecified: Secondary | ICD-10-CM | POA: Diagnosis not present

## 2018-12-02 DIAGNOSIS — C7951 Secondary malignant neoplasm of bone: Secondary | ICD-10-CM | POA: Diagnosis not present

## 2018-12-02 DIAGNOSIS — C50919 Malignant neoplasm of unspecified site of unspecified female breast: Secondary | ICD-10-CM | POA: Diagnosis not present

## 2018-12-09 DIAGNOSIS — C50919 Malignant neoplasm of unspecified site of unspecified female breast: Secondary | ICD-10-CM | POA: Diagnosis not present

## 2018-12-09 DIAGNOSIS — J449 Chronic obstructive pulmonary disease, unspecified: Secondary | ICD-10-CM | POA: Diagnosis not present

## 2018-12-09 DIAGNOSIS — G9340 Encephalopathy, unspecified: Secondary | ICD-10-CM | POA: Diagnosis not present

## 2018-12-09 DIAGNOSIS — I4891 Unspecified atrial fibrillation: Secondary | ICD-10-CM | POA: Diagnosis not present

## 2018-12-09 DIAGNOSIS — C7951 Secondary malignant neoplasm of bone: Secondary | ICD-10-CM | POA: Diagnosis not present

## 2018-12-09 DIAGNOSIS — R1310 Dysphagia, unspecified: Secondary | ICD-10-CM | POA: Diagnosis not present

## 2018-12-12 ENCOUNTER — Other Ambulatory Visit: Payer: Self-pay | Admitting: *Deleted

## 2018-12-12 DIAGNOSIS — F329 Major depressive disorder, single episode, unspecified: Secondary | ICD-10-CM

## 2018-12-12 DIAGNOSIS — F419 Anxiety disorder, unspecified: Principal | ICD-10-CM

## 2018-12-12 DIAGNOSIS — F32A Depression, unspecified: Secondary | ICD-10-CM

## 2018-12-12 MED ORDER — LORAZEPAM 0.5 MG PO TABS: 0.5000 mg | ORAL_TABLET | Freq: Two times a day (BID) | ORAL | 0 refills | Status: DC

## 2018-12-16 DIAGNOSIS — I4891 Unspecified atrial fibrillation: Secondary | ICD-10-CM | POA: Diagnosis not present

## 2018-12-16 DIAGNOSIS — C50919 Malignant neoplasm of unspecified site of unspecified female breast: Secondary | ICD-10-CM | POA: Diagnosis not present

## 2018-12-16 DIAGNOSIS — G9340 Encephalopathy, unspecified: Secondary | ICD-10-CM | POA: Diagnosis not present

## 2018-12-16 DIAGNOSIS — C7951 Secondary malignant neoplasm of bone: Secondary | ICD-10-CM | POA: Diagnosis not present

## 2018-12-16 DIAGNOSIS — J449 Chronic obstructive pulmonary disease, unspecified: Secondary | ICD-10-CM | POA: Diagnosis not present

## 2018-12-16 DIAGNOSIS — R1310 Dysphagia, unspecified: Secondary | ICD-10-CM | POA: Diagnosis not present

## 2018-12-18 DIAGNOSIS — K219 Gastro-esophageal reflux disease without esophagitis: Secondary | ICD-10-CM | POA: Diagnosis not present

## 2018-12-18 DIAGNOSIS — R1310 Dysphagia, unspecified: Secondary | ICD-10-CM | POA: Diagnosis not present

## 2018-12-18 DIAGNOSIS — N183 Chronic kidney disease, stage 3 (moderate): Secondary | ICD-10-CM | POA: Diagnosis not present

## 2018-12-18 DIAGNOSIS — I4891 Unspecified atrial fibrillation: Secondary | ICD-10-CM | POA: Diagnosis not present

## 2018-12-18 DIAGNOSIS — C50919 Malignant neoplasm of unspecified site of unspecified female breast: Secondary | ICD-10-CM | POA: Diagnosis not present

## 2018-12-18 DIAGNOSIS — C7951 Secondary malignant neoplasm of bone: Secondary | ICD-10-CM | POA: Diagnosis not present

## 2018-12-18 DIAGNOSIS — K589 Irritable bowel syndrome without diarrhea: Secondary | ICD-10-CM | POA: Diagnosis not present

## 2018-12-18 DIAGNOSIS — J449 Chronic obstructive pulmonary disease, unspecified: Secondary | ICD-10-CM | POA: Diagnosis not present

## 2018-12-18 DIAGNOSIS — J301 Allergic rhinitis due to pollen: Secondary | ICD-10-CM | POA: Diagnosis not present

## 2018-12-18 DIAGNOSIS — D630 Anemia in neoplastic disease: Secondary | ICD-10-CM | POA: Diagnosis not present

## 2018-12-18 DIAGNOSIS — G9340 Encephalopathy, unspecified: Secondary | ICD-10-CM | POA: Diagnosis not present

## 2018-12-19 DIAGNOSIS — I4891 Unspecified atrial fibrillation: Secondary | ICD-10-CM | POA: Diagnosis not present

## 2018-12-19 DIAGNOSIS — G9340 Encephalopathy, unspecified: Secondary | ICD-10-CM | POA: Diagnosis not present

## 2018-12-19 DIAGNOSIS — R1310 Dysphagia, unspecified: Secondary | ICD-10-CM | POA: Diagnosis not present

## 2018-12-19 DIAGNOSIS — J449 Chronic obstructive pulmonary disease, unspecified: Secondary | ICD-10-CM | POA: Diagnosis not present

## 2018-12-19 DIAGNOSIS — C7951 Secondary malignant neoplasm of bone: Secondary | ICD-10-CM | POA: Diagnosis not present

## 2018-12-19 DIAGNOSIS — C50919 Malignant neoplasm of unspecified site of unspecified female breast: Secondary | ICD-10-CM | POA: Diagnosis not present

## 2018-12-27 DIAGNOSIS — G9340 Encephalopathy, unspecified: Secondary | ICD-10-CM | POA: Diagnosis not present

## 2018-12-27 DIAGNOSIS — C50919 Malignant neoplasm of unspecified site of unspecified female breast: Secondary | ICD-10-CM | POA: Diagnosis not present

## 2018-12-27 DIAGNOSIS — R1310 Dysphagia, unspecified: Secondary | ICD-10-CM | POA: Diagnosis not present

## 2018-12-27 DIAGNOSIS — I4891 Unspecified atrial fibrillation: Secondary | ICD-10-CM | POA: Diagnosis not present

## 2018-12-27 DIAGNOSIS — C7951 Secondary malignant neoplasm of bone: Secondary | ICD-10-CM | POA: Diagnosis not present

## 2018-12-27 DIAGNOSIS — J449 Chronic obstructive pulmonary disease, unspecified: Secondary | ICD-10-CM | POA: Diagnosis not present

## 2018-12-30 DIAGNOSIS — I4891 Unspecified atrial fibrillation: Secondary | ICD-10-CM | POA: Diagnosis not present

## 2018-12-30 DIAGNOSIS — G9340 Encephalopathy, unspecified: Secondary | ICD-10-CM | POA: Diagnosis not present

## 2018-12-30 DIAGNOSIS — C50919 Malignant neoplasm of unspecified site of unspecified female breast: Secondary | ICD-10-CM | POA: Diagnosis not present

## 2018-12-30 DIAGNOSIS — C7951 Secondary malignant neoplasm of bone: Secondary | ICD-10-CM | POA: Diagnosis not present

## 2018-12-30 DIAGNOSIS — R1310 Dysphagia, unspecified: Secondary | ICD-10-CM | POA: Diagnosis not present

## 2018-12-30 DIAGNOSIS — J449 Chronic obstructive pulmonary disease, unspecified: Secondary | ICD-10-CM | POA: Diagnosis not present

## 2019-01-02 DIAGNOSIS — R1310 Dysphagia, unspecified: Secondary | ICD-10-CM | POA: Diagnosis not present

## 2019-01-02 DIAGNOSIS — I4891 Unspecified atrial fibrillation: Secondary | ICD-10-CM | POA: Diagnosis not present

## 2019-01-02 DIAGNOSIS — C7951 Secondary malignant neoplasm of bone: Secondary | ICD-10-CM | POA: Diagnosis not present

## 2019-01-02 DIAGNOSIS — C50919 Malignant neoplasm of unspecified site of unspecified female breast: Secondary | ICD-10-CM | POA: Diagnosis not present

## 2019-01-02 DIAGNOSIS — G9340 Encephalopathy, unspecified: Secondary | ICD-10-CM | POA: Diagnosis not present

## 2019-01-02 DIAGNOSIS — J449 Chronic obstructive pulmonary disease, unspecified: Secondary | ICD-10-CM | POA: Diagnosis not present

## 2019-01-03 ENCOUNTER — Non-Acute Institutional Stay (SKILLED_NURSING_FACILITY): Payer: Medicare Other | Admitting: Internal Medicine

## 2019-01-03 ENCOUNTER — Encounter: Payer: Self-pay | Admitting: Internal Medicine

## 2019-01-03 DIAGNOSIS — C7951 Secondary malignant neoplasm of bone: Secondary | ICD-10-CM | POA: Diagnosis not present

## 2019-01-03 DIAGNOSIS — G9340 Encephalopathy, unspecified: Secondary | ICD-10-CM | POA: Diagnosis not present

## 2019-01-03 DIAGNOSIS — F419 Anxiety disorder, unspecified: Secondary | ICD-10-CM

## 2019-01-03 DIAGNOSIS — J449 Chronic obstructive pulmonary disease, unspecified: Secondary | ICD-10-CM | POA: Diagnosis not present

## 2019-01-03 DIAGNOSIS — C50919 Malignant neoplasm of unspecified site of unspecified female breast: Secondary | ICD-10-CM

## 2019-01-03 DIAGNOSIS — M316 Other giant cell arteritis: Secondary | ICD-10-CM

## 2019-01-03 DIAGNOSIS — F329 Major depressive disorder, single episode, unspecified: Secondary | ICD-10-CM

## 2019-01-03 DIAGNOSIS — R1310 Dysphagia, unspecified: Secondary | ICD-10-CM | POA: Diagnosis not present

## 2019-01-03 DIAGNOSIS — I4891 Unspecified atrial fibrillation: Secondary | ICD-10-CM | POA: Diagnosis not present

## 2019-01-03 NOTE — Progress Notes (Signed)
Location:  [place] Room Number: [place]:  SNF (31) Provider:  L,MD   [name], [name], NP  Patient Care Team: [name], [name], NP as PCP - General (Internal Medicine) Fanny Skates, MD as Consulting Physician (General Surgery) [name], [name], MD as Consulting Physician (Oncology) [name], MD as Consulting Physician (Radiation Oncology) Rockwell Germany, RN as Registered Nurse Mauro Kaufmann, RN as Registered Nurse Gentry Fitz, MD as Consulting Physician (Family Medicine) [name], [name], NP as Nurse Practitioner (Internal Medicine) Thompson Grayer, MD as Consulting Physician (Cardiology) [name], MD as Consulting Physician (Internal Medicine)  Extended Emergency Contact Information Primary Emergency Contact: Matthews,[name] Address: [address]          [place], [place] [address] [place] Phone: [phone] Relation: Daughter Secondary Emergency Contact: Clent Demark [place] Phone: [phone] Relation: Other  Code Status: DNR Goals of care: Advanced Directive information Advanced Directives 01/03/2019  Does Patient Have a Medical Advance Directive? Yes  Type of Paramedic of [place];Out of facility DNR (pink MOST or yellow form)  Does patient want to make changes to medical advance directive? No - Patient declined  Copy of [place] in Chart? -  Would patient like information on creating a medical advance directive? -  Pre-existing out of facility DNR order (yellow form or pink MOST form) Pink MOST form placed in chart (order not valid for inpatient use);Yellow form placed in chart (order not valid for inpatient use)     Chief Complaint  Patient presents with  . Medical Management of Chronic Issues    Routine visit     HPI:  Pt is a 85 y.o. female seen today for medical management of chronic diseases.   Patient  has h/o Bilateral Breast Cancer with Metastatic to Bone, ? Atrial Fibrillation , Hypertension, COPD, S/P Pacemaker for Sick Sinus, Giant Cell arteritis Patient is under hospice care. She is off all her Chemo treatment and actually gained 10 lbs in past few months.  Patient is doing well. Denied any Pain anywhere. Is walking without any assist. Per Nurses independent in her ADLS. Her appetite has improved. She had no complains She is hospice. No New Issues     Past Medical History:  Diagnosis Date  . Asthma    "as a child"  . Breast cancer ([place]) 12/08/2014   Bilateral  . Cancer of central portion of female breast ([place]) 12/04/2014  . Cancer of central portion of female breast ([place]) 12/04/2014  . Complete heart block ([place]) 1999   s/p PPM by Dr Olevia Perches, most recent generator change 2009  . Complication of anesthesia   . COPD (chronic obstructive pulmonary disease) ([place])   . Dementia ([place])   . Diverticulosis   . DJD (degenerative joint disease)    RA  . Esophageal stricture   . Family history of adverse reaction to anesthesia    Daughter N/V  . GERD (gastroesophageal reflux disease)   . Giant cell arteritis ([place])   . Goiter   . Hemorrhoids   . Hiatal hernia   . IBS (irritable bowel syndrome)   . Paroxysmal atrial fibrillation ([place]) 12/17/2015   asymptomatic, <1%, determined on regular pacemaker interrogation  . Pneumonia   . PONV (postoperative nausea and vomiting)   . S/P radiation therapy 04/24/15 completed   T-11  . Wears glasses    Past Surgical History:  Procedure Laterality Date  . ANAL FISSURE REPAIR    . ARTERY BIOPSY Left 01/03/2013   Procedure: BIOPSY LEFT TEMPORAL ARTERY;  Surgeon: Ascencion Dike, MD;  Location: [place];  Service: ENT;  Laterality: Left;  . BREAST BIOPSY Right   . CHOLECYSTECTOMY    . COLONOSCOPY    . FRACTURE SURGERY     fx lt wrist  . HEMORRHOID SURGERY    . INSERT / REPLACE / [place]   Gen change ([facility]) by Dr  Olevia Perches 2009  . IR FLUORO GUIDE PORT INSERTION RIGHT  12/22/2017  . IR US GUIDE VASC ACCESS RIGHT  12/22/2017  . LUNG REMOVAL, PARTIAL  1950   left lower lobectomy for brochiectasis   . MASTECTOMY Left 02/08/2018  . MOUTH SURGERY     patient unaware  . RECTAL POLYPECTOMY    . RENAL CYST EXCISION    . TONSILLECTOMY    . TOTAL MASTECTOMY Left 02/08/2018  . TOTAL MASTECTOMY Left 02/08/2018   Procedure: TOTAL MASTECTOMY LEFT;  Surgeon: Fanny Skates, MD;  Location: [place];  Service: General;  Laterality: Left;    Allergies  Allergen Reactions  . Amoxicillin-Pot Clavulanate Other (See Comments)    Upset stomach Has patient had a PCN reaction causing immediate rash, facial/tongue/throat swelling, SOB or lightheadedness with hypotension: No Has patient had a PCN reaction causing severe rash involving mucus membranes or skin necrosis: No Has patient had a PCN reaction that required hospitalization: No Has patient had a PCN reaction occurring within the last 10 years: No If all of the above answers are "NO", then may proceed with Cephalosporin use.   Marland Kitchen Morphine And Related Nausea Only    Outpatient Encounter Medications as of 01/03/2019  Medication Sig  . LORazepam (ATIVAN) 0.5 MG tablet Take 1 tablet (0.5 mg total) by mouth 2 (two) times daily. Take twice daily at 8:00am  and 5pm  . magnesium hydroxide (MILK OF MAGNESIA) 400 MG/5ML suspension Take 30 mLs by mouth daily as needed for mild constipation.  . Menthol, Topical Analgesic, (BIOFREEZE) 4 % GEL Apply small amt. to the back of the neck and down the to the mid back. Apply Biofreeze to neck, shoulders and upper back PRN for pain four times a day  . metoprolol succinate (TOPROL-XL) 50 MG 24 hr tablet Take 50 mg by mouth daily. Take with or immediately following a meal.  . mirtazapine (REMERON) 15 MG tablet Take 15 mg by mouth at bedtime.   . mupirocin ointment (BACTROBAN) 2 % Apply 1 application topically 2 (two) times daily as needed.  Apply to right ear  . omeprazole (PRILOSEC) 20 MG capsule Take 20 mg by mouth daily.  . ondansetron (ZOFRAN) 4 MG tablet Take 4 mg by mouth every 8 (eight) hours as needed for nausea or vomiting.   . predniSONE (DELTASONE) 5 MG tablet Take 5 mg by mouth daily with breakfast.  . QUEtiapine (SEROQUEL) 25 MG tablet Take 25 mg by mouth at bedtime.  . sennosides-docusate sodium (SENOKOT-S) 8.6-50 MG tablet Take 1 tablet by mouth at bedtime.  . [DISCONTINUED] acetaminophen (TYLENOL) 325 MG tablet Take 650 mg by mouth 2 (two) times daily.  . [DISCONTINUED] Nutritional Supplements (RESOURCE 2.0 PO) Take 180 mLs by mouth 3 (three) times daily.   No facility-administered encounter medications on file as of 01/03/2019.     Review of Systems  Review of Systems  Constitutional: Negative for activity change, appetite change, chills, diaphoresis, fatigue and  fever.  HENT: Negative for mouth sores, postnasal drip, rhinorrhea, sinus pain and sore throat.   Respiratory: Negative for apnea, cough, chest tightness, shortness of breath and wheezing.   Cardiovascular: Negative for chest pain, palpitations and leg swelling.  Gastrointestinal: Negative for abdominal distention, abdominal pain, constipation, diarrhea, nausea and vomiting.  Genitourinary: Negative for dysuria and frequency.  Musculoskeletal: Negative for arthralgias, joint swelling and myalgias.  Skin: Negative for rash.  Neurological: Negative for dizziness, syncope, weakness, light-headedness and numbness.  Psychiatric/Behavioral: Negative for behavioral problems, confusion and sleep disturbance.     Immunization History  Administered Date(s) Administered  . Influenza Split 07/24/2011, 06/20/2012  . Influenza Whole 10/19/2001, 07/25/2007, 08/02/2008, 07/18/2009, 07/10/2010, 07/21/2018  . Influenza,inj,Quad PF,6+ Mos 07/18/2013, 08/27/2014  . Influenza-Unspecified 07/31/2015, 08/19/2016, 08/09/2017  . Pneumococcal Conjugate-13 08/16/2017  .  Pneumococcal Polysaccharide-23 10/19/1997, 01/17/2010  . Td 10/19/1996, 07/25/2007   Pertinent  Health Maintenance Due  Topic Date Due  . INFLUENZA VACCINE  Completed  . DEXA SCAN  Completed  . PNA vac Low Risk Adult  Completed   Fall Risk  07/05/2018 07/01/2017 02/22/2017 05/22/2015 03/22/2015  Falls in the past year? No No No No No   Functional Status Survey:    Vitals:   01/03/19 0904  BP: 140/86  Pulse: 89  Resp: 18  Temp: 97.6 F (36.4 C)  SpO2: 96%  Weight: 118 lb 6.4 oz (53.7 kg)  Height: 5\' 1"  (1.549 m)   Body mass index is 22.37 kg/m. Physical Exam  Constitutional:  Well-developed and well-nourished.  HENT:  Head: Normocephalic.  Mouth/Throat: Oropharynx is clear and moist.  Eyes: Pupils are equal, round, and reactive to light.  Neck: Neck supple.  Cardiovascular: Normal rate and normal heart sounds.  Positive for murmur  Pulmonary/Chest: Effort normal and breath sounds normal. No respiratory distress. No wheezes. She has no rales.  Abdominal: Soft. Bowel sounds are normal. No distension. There is no tenderness. There is no rebound.  Musculoskeletal: No edema.  Lymphadenopathy: none Neurological: Alert and oriented to place. But got Confused of her age and Year Skin: Skin is warm and dry.  Psychiatric: Normal mood and affect. Behavior is normal. Thought content normal.    Labs reviewed: Recent Labs    03/30/18 1327 03/31/18 0406 04/01/18 0349 04/26/18 05/02/18 06/09/18  NA 128* 134* 135 132* 136* 141  K 3.0* 4.9 3.8 4.0 4.6 3.9  CL 93* 102 107 95 99 104  CO2 26 20* 23 30 32 32  GLUCOSE 107* 153* 102*  --   --   --   BUN 5* 6 9 15 10 14   CREATININE 0.64 0.76 0.64 0.8 0.8 0.7  CALCIUM 8.4* 8.5* 7.7* 8.0 8.9 8.4  MG  --  1.8  --   --   --   --    Recent Labs    02/17/18 03/04/18 1034  03/30/18 1327 04/26/18 06/09/18  AST 37 37*   < > 46* 28 23  ALT 32 35   < > 34 20 17  ALKPHOS 71 92   < > 69 70 50  BILITOT 0.7 0.6  --  0.7  --   --   PROT  --  6.6   --  6.1* 5.1 5.0  ALBUMIN 3.7 3.3*   < > 3.0* 3.0 2.9   < > = values in this interval not displayed.   Recent Labs    02/03/18 1344  03/04/18 1034  03/30/18 1327 03/31/18 0406 04/01/18 0349 04/26/18 06/09/18  WBC 7.1   < > 5.8   < > 8.8 8.2 9.5 10.0 6.6  NEUTROABS 6.0  --  4.0  --  6.1  --   --   --   --   HGB 12.6   < > 12.1   < > 12.3 12.5 10.8* 11.4* 11.6*  HCT 39.5   < > 37.1   < > 36.4 37.2 32.6* 34* 35*  MCV 89.4   < > 85.9  --  81.8 81.6 83.0  --   --   PLT 225   < > 198   < > 188 247 223 284 304   < > = values in this interval not displayed.   Lab Results  Component Value Date   TSH 0.719 03/31/2018   Lab Results  Component Value Date   HGBA1C 6.1 (H) 02/09/2018   Lab Results  Component Value Date   CHOL 194 07/25/2007   HDL 62.7 07/25/2007   LDLCALC 118 (H) 07/25/2007   TRIG 69 07/25/2007   CHOLHDL 3.1 CALC 07/25/2007    Significant Diagnostic Results in last 30 days:  No results found.  Assessment/Plan  Metastatic breast Cancer She stopped chemo and is Hospice now Is doing well physically PAF Not on any anticoagulation Just Had 1 episode when she was sick with pneumonitis Has been on Metoprolol Anxiety With Depression Continue on Remeron and Ativan and Seroquel . Would not decrease any meds right now GERD stable on Prilosec Giant Cell Arteritis On Chronic Prednisone Probably helps with her Cancer burden Family/ staff Communication:  Labs/tests ordered:  Patient is Hospice. Would not do aggressive testing right now  Total time spent in this patient care encounter was 25_ minutes; greater than 50% of the visit spent counseling patient, reviewing records , Labs and coordinating care for problems addressed at this encounter.

## 2019-01-05 DIAGNOSIS — R1310 Dysphagia, unspecified: Secondary | ICD-10-CM | POA: Diagnosis not present

## 2019-01-05 DIAGNOSIS — I4891 Unspecified atrial fibrillation: Secondary | ICD-10-CM | POA: Diagnosis not present

## 2019-01-05 DIAGNOSIS — C50919 Malignant neoplasm of unspecified site of unspecified female breast: Secondary | ICD-10-CM | POA: Diagnosis not present

## 2019-01-05 DIAGNOSIS — J449 Chronic obstructive pulmonary disease, unspecified: Secondary | ICD-10-CM | POA: Diagnosis not present

## 2019-01-05 DIAGNOSIS — G9340 Encephalopathy, unspecified: Secondary | ICD-10-CM | POA: Diagnosis not present

## 2019-01-05 DIAGNOSIS — C7951 Secondary malignant neoplasm of bone: Secondary | ICD-10-CM | POA: Diagnosis not present

## 2019-01-10 ENCOUNTER — Other Ambulatory Visit: Payer: Self-pay | Admitting: *Deleted

## 2019-01-10 DIAGNOSIS — F32A Depression, unspecified: Secondary | ICD-10-CM

## 2019-01-10 DIAGNOSIS — F419 Anxiety disorder, unspecified: Principal | ICD-10-CM

## 2019-01-10 DIAGNOSIS — F329 Major depressive disorder, single episode, unspecified: Secondary | ICD-10-CM

## 2019-01-10 MED ORDER — LORAZEPAM 0.5 MG PO TABS: 0.5000 mg | ORAL_TABLET | Freq: Two times a day (BID) | ORAL | 0 refills | Status: DC

## 2019-01-11 ENCOUNTER — Encounter: Payer: Self-pay | Admitting: Nurse Practitioner

## 2019-01-11 ENCOUNTER — Non-Acute Institutional Stay (SKILLED_NURSING_FACILITY): Payer: Medicare Other | Admitting: Nurse Practitioner

## 2019-01-11 DIAGNOSIS — B354 Tinea corporis: Secondary | ICD-10-CM | POA: Diagnosis not present

## 2019-01-11 DIAGNOSIS — M316 Other giant cell arteritis: Secondary | ICD-10-CM

## 2019-01-11 NOTE — Progress Notes (Signed)
Location:  [facility] Room Number: [place]:  SNF (31) Provider:  Waldon Sheerin, Lennie Odor  NP  Amaurie Wandel X, NP  Patient Care Team: Zabian Swayne X, NP as PCP - General (Internal Medicine) Fanny Skates, MD as Consulting Physician (General Surgery) [name], [name], MD as Consulting Physician (Oncology) [name], MD as Consulting Physician (Radiation Oncology) Rockwell Germany, RN as Registered Nurse Mauro Kaufmann, RN as Registered Nurse Gentry Fitz, MD as Consulting Physician (Family Medicine) Caitland Porchia X, NP as Nurse Practitioner (Internal Medicine) Thompson Grayer, MD as Consulting Physician (Cardiology) [name], MD as Consulting Physician (Internal Medicine)  Extended Emergency Contact Information Primary Emergency Contact: Matthews,[name] Address: [address]          [place], [place] [address] [place] Mobile Phone: [phone] Relation: Daughter Secondary Emergency Contact: Clent Demark [place] Phone: [phone] Relation: Other  Code Status:  DNR Goals of care: Advanced Directive information Advanced Directives 01/03/2019  Does Patient Have a Medical Advance Directive? Yes  Type of Paramedic of [place];Out of facility DNR (pink MOST or yellow form)  Does patient want to make changes to medical advance directive? No - Patient declined  Copy of [place] in Chart? -  Would patient like information on creating a medical advance directive? -  Pre-existing out of facility DNR order (yellow form or pink MOST form) Pink MOST form placed in chart (order not valid for inpatient use);Yellow form placed in chart (order not valid for inpatient use)     Chief Complaint  Patient presents with   Acute Visit    C/o - Rash on abdomen    HPI:  Pt is a 85 y.o. female seen today for an acute visit for raised skin crusty red area to the right lower  abd. The patient stated it has been there for a long time, she admitted occasional itching when it is irritated. She uses Prednisone 5mg  chronically for Temporal arteritis.     Past Medical History:  Diagnosis Date   Asthma    "as a child"   Breast cancer ([facility]) 12/08/2014   Bilateral   Cancer of central portion of female breast ([facility]) 12/04/2014   Cancer of central portion of female breast ([place]) 12/04/2014   Complete heart block ([facility]) 1999   s/p PPM by Dr Olevia Perches, most recent generator change 1937   Complication of anesthesia    COPD (chronic obstructive pulmonary disease) (HCC)    Dementia (HCC)    Diverticulosis    DJD (degenerative joint disease)    RA   Esophageal stricture    Family history of adverse reaction to anesthesia    Daughter N/V   GERD (gastroesophageal reflux disease)    Giant cell arteritis (HCC)    Goiter    Hemorrhoids    Hiatal hernia    IBS (irritable bowel syndrome)    Paroxysmal atrial fibrillation ([facility]) 12/17/2015   asymptomatic, <1%, determined on regular pacemaker interrogation   Pneumonia    PONV (postoperative nausea and vomiting)    S/P radiation therapy 04/24/15 completed   T-11   Wears glasses    Past Surgical History:  Procedure Laterality Date   ANAL FISSURE REPAIR     ARTERY BIOPSY Left 01/03/2013   Procedure: BIOPSY LEFT TEMPORAL ARTERY;  Surgeon: Ascencion Dike, MD;  Location: [facility];  Service: ENT;  Laterality: Left;  BREAST BIOPSY Right    CHOLECYSTECTOMY     COLONOSCOPY     FRACTURE SURGERY     fx lt wrist   HEMORRHOID SURGERY     INSERT / REPLACE / REMOVE PACEMAKER  1999   Gen change (SJM) by Dr Olevia Perches 2009   IR FLUORO GUIDE PORT INSERTION RIGHT  12/22/2017   IR US GUIDE VASC ACCESS RIGHT  12/22/2017   LUNG REMOVAL, PARTIAL  1950   left lower lobectomy for brochiectasis    MASTECTOMY Left 02/08/2018   MOUTH SURGERY     patient unaware   RECTAL POLYPECTOMY     RENAL CYST  EXCISION     TONSILLECTOMY     TOTAL MASTECTOMY Left 02/08/2018   TOTAL MASTECTOMY Left 02/08/2018   Procedure: TOTAL MASTECTOMY LEFT;  Surgeon: Fanny Skates, MD;  Location: [facility];  Service: General;  Laterality: Left;    Allergies  Allergen Reactions   Amoxicillin-Pot Clavulanate Other (See Comments)    Upset stomach Has patient had a PCN reaction causing immediate rash, facial/tongue/throat swelling, SOB or lightheadedness with hypotension: No Has patient had a PCN reaction causing severe rash involving mucus membranes or skin necrosis: No Has patient had a PCN reaction that required hospitalization: No Has patient had a PCN reaction occurring within the last 10 years: No If all of the above answers are "NO", then may proceed with Cephalosporin use.    Morphine And Related Nausea Only    Outpatient Encounter Medications as of 01/11/2019  Medication Sig   LORazepam (ATIVAN) 0.5 MG tablet Take 1 tablet (0.5 mg total) by mouth 2 (two) times daily. Take twice daily at 8:00am  and 5pm   magnesium hydroxide (MILK OF MAGNESIA) 400 MG/5ML suspension Take 30 mLs by mouth daily as needed for mild constipation.   Menthol, Topical Analgesic, (BIOFREEZE) 4 % GEL Apply small amt. to the back of the neck and down the to the mid back. Apply Biofreeze to neck, shoulders and upper back PRN for pain four times a day   metoprolol succinate (TOPROL-XL) 50 MG 24 hr tablet Take 50 mg by mouth daily. Take with or immediately following a meal.   mirtazapine (REMERON) 15 MG tablet Take 15 mg by mouth at bedtime.    mupirocin ointment (BACTROBAN) 2 % Apply 1 application topically 2 (two) times daily as needed. Apply to right ear   omeprazole (PRILOSEC) 20 MG capsule Take 20 mg by mouth daily.   ondansetron (ZOFRAN) 4 MG tablet Take 4 mg by mouth every 8 (eight) hours as needed for nausea or vomiting.    predniSONE (DELTASONE) 5 MG tablet Take 5 mg by mouth daily with breakfast.   QUEtiapine  (SEROQUEL) 25 MG tablet Take 25 mg by mouth at bedtime.   sennosides-docusate sodium (SENOKOT-S) 8.6-50 MG tablet Take 1 tablet by mouth at bedtime.   No facility-administered encounter medications on file as of 01/11/2019.     Review of Systems  Constitutional: Negative for activity change, appetite change, chills, diaphoresis and fatigue.  HENT: Positive for hearing loss. Negative for congestion and voice change.   Respiratory: Negative for cough, shortness of breath and wheezing.   Gastrointestinal: Negative for abdominal distention, abdominal pain, constipation, diarrhea, nausea and vomiting.  Genitourinary: Negative for difficulty urinating, dysuria and urgency.  Musculoskeletal: Positive for back pain and gait problem.  Skin: Positive for rash. Negative for color change.  Neurological: Negative for dizziness, speech difficulty, weakness and headaches.       Memory  lapses.   Psychiatric/Behavioral: Negative for agitation, behavioral problems, hallucinations and sleep disturbance. The patient is not nervous/anxious.     Immunization History  Administered Date(s) Administered   Influenza Split 07/24/2011, 06/20/2012   Influenza Whole 10/19/2001, 07/25/2007, 08/02/2008, 07/18/2009, 07/10/2010, 07/21/2018   Influenza,inj,Quad PF,6+ Mos 07/18/2013, 08/27/2014   Influenza-Unspecified 07/31/2015, 08/19/2016, 08/09/2017   Pneumococcal Conjugate-13 08/16/2017   Pneumococcal Polysaccharide-23 10/19/1997, 01/17/2010   Td 10/19/1996, 07/25/2007   Pertinent  Health Maintenance Due  Topic Date Due   INFLUENZA VACCINE  Completed   DEXA SCAN  Completed   PNA vac Low Risk Adult  Completed   Fall Risk  07/05/2018 07/01/2017 02/22/2017 05/22/2015 03/22/2015  Falls in the past year? No No No No No   Functional Status Survey:    Vitals:   01/11/19 1349  BP: (!) 90/50  Pulse: 79  Resp: 20  Temp: 97.8 F (36.6 C)  SpO2: 97%  Weight: 118 lb 6.4 oz (53.7 kg)  Height: 5\' 1"  (1.549 m)     Body mass index is 22.37 kg/m. Physical Exam Constitutional:      Appearance: Normal appearance.  HENT:     Head: Normocephalic and atraumatic.     Nose: Nose normal.     Mouth/Throat:     Mouth: Mucous membranes are moist.  Eyes:     Extraocular Movements: Extraocular movements intact.     Pupils: Pupils are equal, round, and reactive to light.  Neck:     Musculoskeletal: Normal range of motion and neck supple.  Cardiovascular:     Rate and Rhythm: Normal rate and regular rhythm.     Heart sounds: Murmur present.     Comments: Pacemaker left upper chest Pulmonary:     Effort: Pulmonary effort is normal.     Breath sounds: No wheezing, rhonchi or rales.  Abdominal:     General: There is no distension.     Palpations: Abdomen is soft.     Tenderness: There is no abdominal tenderness. There is no guarding or rebound.  Musculoskeletal: Normal range of motion.     Right lower leg: No edema.     Left lower leg: No edema.     Comments: Ambulates with walker.   Skin:    General: Skin is warm and dry.     Findings: Erythema and rash present.     Comments: About 1x1 inch raised scaly mild erythematous area right lower abd, no ulceration, s/s of infection.   Neurological:     General: No focal deficit present.     Mental Status: She is alert. Mental status is at baseline.     Motor: No weakness.     Coordination: Coordination normal.     Gait: Gait abnormal.     Comments: Oriented to person and place.   Psychiatric:        Mood and Affect: Mood normal.        Behavior: Behavior normal.     Labs reviewed: Recent Labs    03/30/18 1327 03/31/18 0406 04/01/18 0349 04/26/18 05/02/18 06/09/18  NA 128* 134* 135 132* 136* 141  K 3.0* 4.9 3.8 4.0 4.6 3.9  CL 93* 102 107 95 99 104  CO2 26 20* 23 30 32 32  GLUCOSE 107* 153* 102*  --   --   --   BUN 5* 6 9 15 10 14   CREATININE 0.64 0.76 0.64 0.8 0.8 0.7  CALCIUM 8.4* 8.5* 7.7* 8.0 8.9 8.4  MG  --  1.8  --   --   --   --     Recent Labs    02/17/18 03/04/18 1034  03/30/18 1327 04/26/18 06/09/18  AST 37 37*   < > 46* 28 23  ALT 32 35   < > 34 20 17  ALKPHOS 71 92   < > 69 70 50  BILITOT 0.7 0.6  --  0.7  --   --   PROT  --  6.6  --  6.1* 5.1 5.0  ALBUMIN 3.7 3.3*   < > 3.0* 3.0 2.9   < > = values in this interval not displayed.   Recent Labs    02/03/18 1344  03/04/18 1034  03/30/18 1327 03/31/18 0406 04/01/18 0349 04/26/18 06/09/18  WBC 7.1   < > 5.8   < > 8.8 8.2 9.5 10.0 6.6  NEUTROABS 6.0  --  4.0  --  6.1  --   --   --   --   HGB 12.6   < > 12.1   < > 12.3 12.5 10.8* 11.4* 11.6*  HCT 39.5   < > 37.1   < > 36.4 37.2 32.6* 34* 35*  MCV 89.4   < > 85.9  --  81.8 81.6 83.0  --   --   PLT 225   < > 198   < > 188 247 223 284 304   < > = values in this interval not displayed.   Lab Results  Component Value Date   TSH 0.719 03/31/2018   Lab Results  Component Value Date   HGBA1C 6.1 (H) 02/09/2018   Lab Results  Component Value Date   CHOL 194 07/25/2007   HDL 62.7 07/25/2007   LDLCALC 118 (H) 07/25/2007   TRIG 69 07/25/2007   CHOLHDL 3.1 CALC 07/25/2007    Significant Diagnostic Results in last 30 days:  No results found.  Assessment/Plan Tinea corporis Ringworm appearance scaly, mild erythematous, raised skin lesion at the right  lower abd, the patient stated it has been there for a long time, occasionally itching, no ulceration/bleeding, size about 1inch/1inch. The patient declined dermatology referral. Will apply Clotrimazole/Betamethasone cream 1%/0.05% bid x 2 weeks. Observe.  Temporal arteritis Stable, continue Prednisone 5mg  qd.      Family/ staff Communication: plan of care reviewed with the patient and charge nurse.   Labs/tests ordered:  none  Time spend 25 minutes.

## 2019-01-11 NOTE — Assessment & Plan Note (Addendum)
Ringworm appearance scaly, mild erythematous, raised skin lesion at the right  lower abd, the patient stated it has been there for a long time, occasionally itching, no ulceration/bleeding, size about 1inch/1inch. The patient declined dermatology referral. Will apply Clotrimazole/Betamethasone cream 1%/0.05% bid x 2 weeks. Observe.

## 2019-01-11 NOTE — Assessment & Plan Note (Signed)
Stable, continue Prednisone 5mg  qd.

## 2019-02-02 ENCOUNTER — Encounter: Payer: Self-pay | Admitting: Nurse Practitioner

## 2019-02-02 ENCOUNTER — Non-Acute Institutional Stay (SKILLED_NURSING_FACILITY): Payer: Medicare Other | Admitting: Nurse Practitioner

## 2019-02-02 DIAGNOSIS — F419 Anxiety disorder, unspecified: Secondary | ICD-10-CM

## 2019-02-02 DIAGNOSIS — K219 Gastro-esophageal reflux disease without esophagitis: Secondary | ICD-10-CM

## 2019-02-02 DIAGNOSIS — I10 Essential (primary) hypertension: Secondary | ICD-10-CM | POA: Diagnosis not present

## 2019-02-02 DIAGNOSIS — F329 Major depressive disorder, single episode, unspecified: Secondary | ICD-10-CM | POA: Diagnosis not present

## 2019-02-02 DIAGNOSIS — M316 Other giant cell arteritis: Secondary | ICD-10-CM | POA: Diagnosis not present

## 2019-02-02 DIAGNOSIS — K5901 Slow transit constipation: Secondary | ICD-10-CM | POA: Diagnosis not present

## 2019-02-02 DIAGNOSIS — R443 Hallucinations, unspecified: Secondary | ICD-10-CM | POA: Diagnosis not present

## 2019-02-02 DIAGNOSIS — I4891 Unspecified atrial fibrillation: Secondary | ICD-10-CM

## 2019-02-02 NOTE — Assessment & Plan Note (Signed)
Her mood is stable, continue Lorazepam 0.5mg  bid, Mirtazapine 15mg  qd.

## 2019-02-02 NOTE — Assessment & Plan Note (Signed)
Blood pressure is controlled, continue Metoprolol 50mg  qd.

## 2019-02-02 NOTE — Assessment & Plan Note (Signed)
Stable, continue Senokot S qhs, MOM prn

## 2019-02-02 NOTE — Assessment & Plan Note (Signed)
Stable, continue Omeprazole 20mg qd.  

## 2019-02-02 NOTE — Assessment & Plan Note (Signed)
Stable, continue Prednisone 5mg  qd.

## 2019-02-02 NOTE — Progress Notes (Signed)
Location:  [facility] Room Number: [place]:  SNF (31) Provider:  Mikie Misner, Lennie Odor  NP  Ivor Kishi X, NP  Patient Care Team: Freddy Spadafora X, NP as PCP - General (Internal Medicine) Fanny Skates, MD as Consulting Physician (General Surgery) [name], [name], MD as Consulting Physician (Oncology) [name], MD as Consulting Physician (Radiation Oncology) Rockwell Germany, RN as Registered Nurse Mauro Kaufmann, RN as Registered Nurse Gentry Fitz, MD as Consulting Physician (Family Medicine) Arwyn Besaw X, NP as Nurse Practitioner (Internal Medicine) Thompson Grayer, MD as Consulting Physician (Cardiology) [name], MD as Consulting Physician (Internal Medicine)  Extended Emergency Contact Information Primary Emergency Contact: Matthews,[name] Address: [address]          [place], [place] [address] [place] Mobile Phone: [phone] Relation: Daughter Secondary Emergency Contact: Clent Demark [place] Phone: [phone] Relation: Other  Code Status:  DNR Goals of care: Advanced Directive information Advanced Directives 02/02/2019  Does Patient Have a Medical Advance Directive? Yes  Type of Paramedic of [place];Out of facility DNR (pink MOST or yellow form)  Does patient want to make changes to medical advance directive? No - Patient declined  Copy of [place] in Chart? Yes - validated most recent copy scanned in chart (See row information)  Would patient like information on creating a medical advance directive? -  Pre-existing out of facility DNR order (yellow form or pink MOST form) Yellow form placed in chart (order not valid for inpatient use);Pink MOST form placed in chart (order not valid for inpatient use)     Chief Complaint  Patient presents with  . Medical Management of Chronic Issues    HPI:  Pt is a 85 y.o. female seen today for  medical management of chronic diseases.    The patient resides in memory care unit [facility] for safety and care assistance. Her mood is stable on Lorazepam 0.5mg  bid, Mirtazapine 15mg  qd. No constipation while on Senokot S I qhs, MOM prn. No psychosis, on Quetiapine 25mg  qd. Giant cells arteritis, stable on Prednisone 5mg  qd. GERD, stable on Omeprazole 20mg  qd. HTN, blood pressures is controlled on Metoprolol 50mg  qd.    Past Medical History:  Diagnosis Date  . Asthma    "as a child"  . Breast cancer ([facility]) 12/08/2014   Bilateral  . Cancer of central portion of female breast ([facility]) 12/04/2014  . Cancer of central portion of female breast ([facility]) 12/04/2014  . Complete heart block ([facility]) 1999   s/p PPM by Dr Olevia Perches, most recent generator change 2009  . Complication of anesthesia   . COPD (chronic obstructive pulmonary disease) ([facility])   . Dementia ([facility])   . Diverticulosis   . DJD (degenerative joint disease)    RA  . Esophageal stricture   . Family history of adverse reaction to anesthesia    Daughter N/V  . GERD (gastroesophageal reflux disease)   . Giant cell arteritis ([facility])   . Goiter   . Hemorrhoids   . Hiatal hernia   . IBS (irritable bowel syndrome)   . Paroxysmal atrial fibrillation ([facility]) 12/17/2015   asymptomatic, <1%, determined on regular pacemaker interrogation  . Pneumonia   . PONV (postoperative nausea and vomiting)   . S/P radiation therapy 04/24/15 completed   T-11  . Wears glasses    Past Surgical History:  Procedure Laterality Date  . ANAL FISSURE REPAIR    .  ARTERY BIOPSY Left 01/03/2013   Procedure: BIOPSY LEFT TEMPORAL ARTERY;  Surgeon: Ascencion Dike, MD;  Location: [facility];  Service: ENT;  Laterality: Left;  . BREAST BIOPSY Right   . CHOLECYSTECTOMY    . COLONOSCOPY    . FRACTURE SURGERY     fx lt wrist  . HEMORRHOID SURGERY    . INSERT / REPLACE / [facility]   Gen change (SJM) by Dr Olevia Perches 2009  . IR FLUORO GUIDE PORT INSERTION RIGHT   12/22/2017  . IR US GUIDE VASC ACCESS RIGHT  12/22/2017  . LUNG REMOVAL, PARTIAL  1950   left lower lobectomy for brochiectasis   . MASTECTOMY Left 02/08/2018  . MOUTH SURGERY     patient unaware  . RECTAL POLYPECTOMY    . RENAL CYST EXCISION    . TONSILLECTOMY    . TOTAL MASTECTOMY Left 02/08/2018  . TOTAL MASTECTOMY Left 02/08/2018   Procedure: TOTAL MASTECTOMY LEFT;  Surgeon: Fanny Skates, MD;  Location: [facility];  Service: General;  Laterality: Left;    Allergies  Allergen Reactions  . Amoxicillin-Pot Clavulanate Other (See Comments)    Upset stomach Has patient had a PCN reaction causing immediate rash, facial/tongue/throat swelling, SOB or lightheadedness with hypotension: No Has patient had a PCN reaction causing severe rash involving mucus membranes or skin necrosis: No Has patient had a PCN reaction that required hospitalization: No Has patient had a PCN reaction occurring within the last 10 years: No If all of the above answers are "NO", then may proceed with Cephalosporin use.   Marland Kitchen Morphine And Related Nausea Only    Outpatient Encounter Medications as of 02/02/2019  Medication Sig  . LORazepam (ATIVAN) 0.5 MG tablet Take 1 tablet (0.5 mg total) by mouth 2 (two) times daily. Take twice daily at 8:00am  and 5pm  . magnesium hydroxide (MILK OF MAGNESIA) 400 MG/5ML suspension Take 30 mLs by mouth daily as needed for mild constipation.  . Menthol, Topical Analgesic, (BIOFREEZE) 4 % GEL Apply small amt. to the back of the neck and down the to the mid back. Apply Biofreeze to neck, shoulders and upper back PRN for pain four times a day  . metoprolol succinate (TOPROL-XL) 50 MG 24 hr tablet Take 50 mg by mouth daily. Take with or immediately following a meal.  . mirtazapine (REMERON) 15 MG tablet Take 15 mg by mouth at bedtime.   . mupirocin ointment (BACTROBAN) 2 % Apply 1 application topically 2 (two) times daily as needed. Apply to right ear  . omeprazole (PRILOSEC) 20 MG capsule  Take 20 mg by mouth daily.  . ondansetron (ZOFRAN) 4 MG tablet Take 4 mg by mouth every 8 (eight) hours as needed for nausea or vomiting.   . predniSONE (DELTASONE) 5 MG tablet Take 5 mg by mouth daily with breakfast.  . QUEtiapine (SEROQUEL) 25 MG tablet Take 25 mg by mouth at bedtime.  . sennosides-docusate sodium (SENOKOT-S) 8.6-50 MG tablet Take 1 tablet by mouth at bedtime.   No facility-administered encounter medications on file as of 02/02/2019.    ROS was provided with assistance of staff Review of Systems  Constitutional: Negative for activity change, appetite change, chills, diaphoresis, fatigue, fever and unexpected weight change.       About #2Ibs wight gain in the past month  HENT: Positive for hearing loss. Negative for congestion and voice change.   Eyes: Negative for visual disturbance.  Respiratory: Negative for cough, shortness of  breath and wheezing.   Cardiovascular: Negative for chest pain, palpitations and leg swelling.  Gastrointestinal: Negative for abdominal distention, abdominal pain, constipation, diarrhea, nausea and vomiting.  Genitourinary: Negative for difficulty urinating, dysuria and urgency.  Musculoskeletal: Positive for arthralgias and gait problem.  Skin: Negative for color change and pallor.  Neurological: Negative for dizziness, speech difficulty and headaches.       Memory lapses.   Psychiatric/Behavioral: Negative for agitation, behavioral problems, hallucinations and sleep disturbance. The patient is not nervous/anxious.     Immunization History  Administered Date(s) Administered  . Influenza Split 07/24/2011, 06/20/2012  . Influenza Whole 10/19/2001, 07/25/2007, 08/02/2008, 07/18/2009, 07/10/2010, 07/21/2018  . Influenza,inj,Quad PF,6+ Mos 07/18/2013, 08/27/2014  . Influenza-Unspecified 07/31/2015, 08/19/2016, 08/09/2017  . Pneumococcal Conjugate-13 08/16/2017  . Pneumococcal Polysaccharide-23 10/19/1997, 01/17/2010  . Td 10/19/1996,  07/25/2007   Pertinent  Health Maintenance Due  Topic Date Due  . INFLUENZA VACCINE  05/20/2019  . DEXA SCAN  Completed  . PNA vac Low Risk Adult  Completed   Fall Risk  07/05/2018 07/01/2017 02/22/2017 05/22/2015 03/22/2015  Falls in the past year? No No No No No   Functional Status Survey:    Vitals:   02/02/19 0850  BP: 102/70  Pulse: 68  Resp: 18  Temp: (!) 97 F (36.1 C)  SpO2: 96%  Weight: 120 lb 6.4 oz (54.6 kg)  Height: 5\' 1"  (1.549 m)   Body mass index is 22.75 kg/m. Physical Exam Vitals signs and nursing note reviewed.  Constitutional:      General: She is not in acute distress.    Appearance: Normal appearance. She is normal weight. She is not ill-appearing, toxic-appearing or diaphoretic.  HENT:     Head: Normocephalic and atraumatic.     Nose: Nose normal.     Mouth/Throat:     Mouth: Mucous membranes are moist.  Eyes:     Extraocular Movements: Extraocular movements intact.     Conjunctiva/sclera: Conjunctivae normal.     Pupils: Pupils are equal, round, and reactive to light.  Neck:     Musculoskeletal: Normal range of motion and neck supple.  Cardiovascular:     Rate and Rhythm: Normal rate and regular rhythm.     Heart sounds: Murmur present.     Comments: Pacemaker.  Pulmonary:     Breath sounds: No wheezing, rhonchi or rales.  Abdominal:     General: There is no distension.     Palpations: Abdomen is soft.     Tenderness: There is no abdominal tenderness. There is no guarding or rebound.  Musculoskeletal:     Right lower leg: No edema.     Left lower leg: No edema.  Skin:    General: Skin is warm and dry.  Neurological:     General: No focal deficit present.     Mental Status: She is alert. Mental status is at baseline.     Motor: No weakness.     Coordination: Coordination normal.     Gait: Gait abnormal.     Comments: Oriented to person and place.   Psychiatric:        Mood and Affect: Mood normal.        Behavior: Behavior normal.         Thought Content: Thought content normal.        Judgment: Judgment normal.     Labs reviewed: Recent Labs    03/30/18 1327 03/31/18 0406 04/01/18 0349 04/26/18 05/02/18 06/09/18  NA 128* 134* 135  132* 136* 141  K 3.0* 4.9 3.8 4.0 4.6 3.9  CL 93* 102 107 95 99 104  CO2 26 20* 23 30 32 32  GLUCOSE 107* 153* 102*  --   --   --   BUN 5* 6 9 15 10 14   CREATININE 0.64 0.76 0.64 0.8 0.8 0.7  CALCIUM 8.4* 8.5* 7.7* 8.0 8.9 8.4  MG  --  1.8  --   --   --   --    Recent Labs    02/17/18 03/04/18 1034  03/30/18 1327 04/26/18 06/09/18  AST 37 37*   < > 46* 28 23  ALT 32 35   < > 34 20 17  ALKPHOS 71 92   < > 69 70 50  BILITOT 0.7 0.6  --  0.7  --   --   PROT  --  6.6  --  6.1* 5.1 5.0  ALBUMIN 3.7 3.3*   < > 3.0* 3.0 2.9   < > = values in this interval not displayed.   Recent Labs    02/03/18 1344  03/04/18 1034  03/30/18 1327 03/31/18 0406 04/01/18 0349 04/26/18 06/09/18  WBC 7.1   < > 5.8   < > 8.8 8.2 9.5 10.0 6.6  NEUTROABS 6.0  --  4.0  --  6.1  --   --   --   --   HGB 12.6   < > 12.1   < > 12.3 12.5 10.8* 11.4* 11.6*  HCT 39.5   < > 37.1   < > 36.4 37.2 32.6* 34* 35*  MCV 89.4   < > 85.9  --  81.8 81.6 83.0  --   --   PLT 225   < > 198   < > 188 247 223 284 304   < > = values in this interval not displayed.   Lab Results  Component Value Date   TSH 0.719 03/31/2018   Lab Results  Component Value Date   HGBA1C 6.1 (H) 02/09/2018   Lab Results  Component Value Date   CHOL 194 07/25/2007   HDL 62.7 07/25/2007   LDLCALC 118 (H) 07/25/2007   TRIG 69 07/25/2007   CHOLHDL 3.1 CALC 07/25/2007    Significant Diagnostic Results in last 30 days:  No results found.  Assessment/Plan: Essential hypertension Blood pressure is controlled, continue Metoprolol 50mg  qd.   Atrial fibrillation, new onset (HCC) Heart rate is in control, continue Metoprolol 50mg  qd.   Temporal arteritis Stable, continue Prednisone 5mg  qd.   GERD (gastroesophageal reflux disease)  Stable, continue Omeprazole 20mg  qd.   Slow transit constipation Stable, continue Senokot S qhs, MOM prn  Anxiety and depression Her mood is stable, continue Lorazepam 0.5mg  bid, Mirtazapine 15mg  qd.   Hallucination Well controlled, continue Quetiapine 25mg  qd.      Family/ staff Communication: plan of care reviewed with the patient and charge nurse.   Labs/tests ordered:  none  Time spend 25 minutes.

## 2019-02-02 NOTE — Assessment & Plan Note (Signed)
Well controlled, continue Quetiapine 25mg  qd.

## 2019-02-02 NOTE — Assessment & Plan Note (Signed)
Heart rate is in control, continue Metoprolol 50mg  qd.

## 2019-02-10 ENCOUNTER — Other Ambulatory Visit: Payer: Self-pay | Admitting: *Deleted

## 2019-02-10 DIAGNOSIS — F32A Depression, unspecified: Secondary | ICD-10-CM

## 2019-02-10 DIAGNOSIS — F329 Major depressive disorder, single episode, unspecified: Secondary | ICD-10-CM

## 2019-02-10 DIAGNOSIS — F419 Anxiety disorder, unspecified: Principal | ICD-10-CM

## 2019-02-10 MED ORDER — LORAZEPAM 0.5 MG PO TABS: ORAL_TABLET | ORAL | 0 refills | Status: DC

## 2019-02-10 NOTE — Telephone Encounter (Signed)
[facility] Fax order Request

## 2019-02-27 ENCOUNTER — Encounter: Payer: Self-pay | Admitting: Nurse Practitioner

## 2019-02-27 ENCOUNTER — Encounter: Payer: Self-pay | Admitting: Family

## 2019-02-27 ENCOUNTER — Non-Acute Institutional Stay (SKILLED_NURSING_FACILITY): Payer: Medicare Other | Admitting: Nurse Practitioner

## 2019-02-27 DIAGNOSIS — F329 Major depressive disorder, single episode, unspecified: Secondary | ICD-10-CM

## 2019-02-27 DIAGNOSIS — I10 Essential (primary) hypertension: Secondary | ICD-10-CM | POA: Diagnosis not present

## 2019-02-27 DIAGNOSIS — M316 Other giant cell arteritis: Secondary | ICD-10-CM | POA: Diagnosis not present

## 2019-02-27 DIAGNOSIS — R635 Abnormal weight gain: Secondary | ICD-10-CM | POA: Insufficient documentation

## 2019-02-27 DIAGNOSIS — F419 Anxiety disorder, unspecified: Secondary | ICD-10-CM

## 2019-02-27 DIAGNOSIS — K219 Gastro-esophageal reflux disease without esophagitis: Secondary | ICD-10-CM

## 2019-02-27 NOTE — Progress Notes (Addendum)
Location:  [facility] Room Number: [place] of Service:  SNF (31) Provider:  Alaena Strader X Rebeca Valdivia, NP   Landrey Mahurin X, NP  Patient Care Team: Sanora Cunanan X, NP as PCP - General (Internal Medicine) Fanny Skates, MD as Consulting Physician (General Surgery) [name], [name], MD as Consulting Physician (Oncology) [name], MD as Consulting Physician (Radiation Oncology) Rockwell Germany, RN as Registered Nurse Mauro Kaufmann, RN as Registered Nurse Gentry Fitz, MD as Consulting Physician (Family Medicine) Rey Dansby X, NP as Nurse Practitioner (Internal Medicine) Thompson Grayer, MD as Consulting Physician (Cardiology) [name], MD as Consulting Physician (Internal Medicine)  Extended Emergency Contact Information Primary Emergency Contact: Matthews,[name] Address: [address]          [place], [place] [address] [place] Mobile Phone: [phone] Relation: Daughter Secondary Emergency Contact: Clent Demark [place] Phone: [phone] Relation: Other   Code Status:  DNR Goals of care: Advanced Directive information Advanced Directives 02/27/2019  Does Patient Have a Medical Advance Directive? Yes  Type of Paramedic of [place];Out of facility DNR (pink MOST or yellow form)  Does patient want to make changes to medical advance directive? No - Patient declined  Copy of [place] in Chart? Yes - validated most recent copy scanned in chart (See row information)  Would patient like information on creating a medical advance directive? -  Pre-existing out of facility DNR order (yellow form or pink MOST form) Yellow form placed in chart (order not valid for inpatient use);Pink MOST form placed in chart (order not valid for inpatient use)     Chief Complaint  Patient presents with  . Medical Management of Chronic Issues    Routine Visit     HPI:  Pt is a 85 y.o.  female seen today for medical management of chronic diseases.    The patient resides in memory care unit [facility] for safety and care assistance, furniture/walker walking  Sometimes. Her mood is stable on Lorazepam 0.5mg  bid, Mirtazapine 15mg  qd, Quetiapine 25mg  qd. No constipation, on Senokot S qd. [place] arteritis, stable, on Prednisone 5mg  qd. GERD, stable on Omeprazole 20mg  qd. HTN, blood pressure is controlled on Metoprolol 50mg  qd.    Past Medical History:  Diagnosis Date  . Asthma    "as a child"  . Breast cancer ([place]) 12/08/2014   Bilateral  . Cancer of central portion of female breast ([place]) 12/04/2014  . Cancer of central portion of female breast ([place]) 12/04/2014  . Complete heart block ([place]) 1999   s/p PPM by Dr Olevia Perches, most recent generator change 2009  . Complication of anesthesia   . COPD (chronic obstructive pulmonary disease) ([place])   . Dementia ([place])   . Diverticulosis   . DJD (degenerative joint disease)    RA  . Esophageal stricture   . Family history of adverse reaction to anesthesia    Daughter N/V  . GERD (gastroesophageal reflux disease)   . Giant cell arteritis ([place])   . Goiter   . Hemorrhoids   . Hiatal hernia   . IBS (irritable bowel syndrome)   . Paroxysmal atrial fibrillation ([place]) 12/17/2015   asymptomatic, <1%, determined on regular pacemaker interrogation  . Pneumonia   . PONV (postoperative nausea and vomiting)   . S/P radiation therapy 04/24/15 completed   T-11  . Wears glasses    Past Surgical History:  Procedure Laterality Date  .  ANAL FISSURE REPAIR    . ARTERY BIOPSY Left 01/03/2013   Procedure: BIOPSY LEFT TEMPORAL ARTERY;  Surgeon: Ascencion Dike, MD;  Location: [facility];  Service: ENT;  Laterality: Left;  . BREAST BIOPSY Right   . CHOLECYSTECTOMY    . COLONOSCOPY    . FRACTURE SURGERY     fx lt wrist  . HEMORRHOID SURGERY    . INSERT / REPLACE / [place]   Gen change (SJM) by Dr Olevia Perches 2009  . IR FLUORO GUIDE PORT  INSERTION RIGHT  12/22/2017  . IR US GUIDE VASC ACCESS RIGHT  12/22/2017  . LUNG REMOVAL, PARTIAL  1950   left lower lobectomy for brochiectasis   . MASTECTOMY Left 02/08/2018  . MOUTH SURGERY     patient unaware  . RECTAL POLYPECTOMY    . RENAL CYST EXCISION    . TONSILLECTOMY    . TOTAL MASTECTOMY Left 02/08/2018  . TOTAL MASTECTOMY Left 02/08/2018   Procedure: TOTAL MASTECTOMY LEFT;  Surgeon: Fanny Skates, MD;  Location: [facility];  Service: General;  Laterality: Left;    Allergies  Allergen Reactions  . Amoxicillin-Pot Clavulanate Other (See Comments)    Upset stomach Has patient had a PCN reaction causing immediate rash, facial/tongue/throat swelling, SOB or lightheadedness with hypotension: No Has patient had a PCN reaction causing severe rash involving mucus membranes or skin necrosis: No Has patient had a PCN reaction that required hospitalization: No Has patient had a PCN reaction occurring within the last 10 years: No If all of the above answers are "NO", then may proceed with Cephalosporin use.   Marland Kitchen Morphine And Related Nausea Only    Outpatient Encounter Medications as of 02/27/2019  Medication Sig  . LORazepam (ATIVAN) 0.5 MG tablet Take one tablet by mouth twice daily at 8:00am  and 5pm for anxiety. Hold for sedation  . magnesium hydroxide (MILK OF MAGNESIA) 400 MG/5ML suspension Take 30 mLs by mouth daily as needed for mild constipation.  . Menthol, Topical Analgesic, (BIOFREEZE) 4 % GEL Apply small amt. to the back of the neck and down the to the mid back. Apply Biofreeze to neck, shoulders and upper back PRN for pain four times a day  . metoprolol succinate (TOPROL-XL) 50 MG 24 hr tablet Take 50 mg by mouth daily. Take with or immediately following a meal.  . mirtazapine (REMERON) 15 MG tablet Take 15 mg by mouth at bedtime.   Marland Kitchen omeprazole (PRILOSEC) 20 MG capsule Take 20 mg by mouth daily.  . ondansetron (ZOFRAN) 4 MG tablet Take 4 mg by mouth every 8 (eight) hours as  needed for nausea or vomiting.   . predniSONE (DELTASONE) 5 MG tablet Take 5 mg by mouth daily with breakfast.  . QUEtiapine (SEROQUEL) 25 MG tablet Take 25 mg by mouth at bedtime.  . sennosides-docusate sodium (SENOKOT-S) 8.6-50 MG tablet Take 1 tablet by mouth at bedtime.  . [DISCONTINUED] mupirocin ointment (BACTROBAN) 2 % Apply 1 application topically 2 (two) times daily as needed. Apply to right ear   No facility-administered encounter medications on file as of 02/27/2019.    ROS was provided with assistance of staff.  Review of Systems  Constitutional: Positive for unexpected weight change. Negative for activity change, appetite change, chills, diaphoresis, fatigue and fever.       Gradual weight gain about #4Ibs in the past month.   HENT: Positive for hearing loss. Negative for congestion and voice change.   Eyes: Negative  for visual disturbance.  Respiratory: Negative for cough, shortness of breath and wheezing.   Cardiovascular: Negative for chest pain, palpitations and leg swelling.  Gastrointestinal: Negative for abdominal distention, abdominal pain, constipation, diarrhea, nausea and vomiting.  Genitourinary: Negative for difficulty urinating, dysuria and urgency.  Musculoskeletal: Positive for gait problem. Negative for myalgias.  Skin: Negative for color change and pallor.  Neurological: Negative for dizziness, speech difficulty, weakness and headaches.       Memory lapses.   Psychiatric/Behavioral: Negative for agitation, behavioral problems, hallucinations and sleep disturbance. The patient is not nervous/anxious.     Immunization History  Administered Date(s) Administered  . Influenza Split 07/24/2011, 06/20/2012  . Influenza Whole 10/19/2001, 07/25/2007, 08/02/2008, 07/18/2009, 07/10/2010, 07/21/2018  . Influenza,inj,Quad PF,6+ Mos 07/18/2013, 08/27/2014  . Influenza-Unspecified 07/31/2015, 08/19/2016, 08/09/2017  . Pneumococcal Conjugate-13 08/16/2017  . Pneumococcal  Polysaccharide-23 10/19/1997, 01/17/2010  . Td 10/19/1996, 07/25/2007   Pertinent  Health Maintenance Due  Topic Date Due  . INFLUENZA VACCINE  05/20/2019  . DEXA SCAN  Completed  . PNA vac Low Risk Adult  Completed   Fall Risk  07/05/2018 07/01/2017 02/22/2017 05/22/2015 03/22/2015  Falls in the past year? No No No No No   Functional Status Survey:    Vitals:   02/27/19 1220  BP: 140/72  Pulse: 74  Resp: 20  Temp: 98.9 F (37.2 C)  TempSrc: Oral  SpO2: 97%  Weight: 124 lb 6.4 oz (56.4 kg)  Height: 5\' 1"  (1.549 m)   Body mass index is 23.51 kg/m. Physical Exam Vitals signs reviewed.  Constitutional:      General: She is not in acute distress.    Appearance: Normal appearance. She is normal weight. She is not ill-appearing, toxic-appearing or diaphoretic.  HENT:     Head: Normocephalic and atraumatic.     Nose: Nose normal. No congestion or rhinorrhea.     Mouth/Throat:     Mouth: Mucous membranes are moist.  Eyes:     Extraocular Movements: Extraocular movements intact.     Pupils: Pupils are equal, round, and reactive to light.  Neck:     Musculoskeletal: Normal range of motion and neck supple.  Cardiovascular:     Rate and Rhythm: Normal rate and regular rhythm.     Heart sounds: Murmur present.     Comments: Pacemaker.  Pulmonary:     Breath sounds: Rales present. No wheezing or rhonchi.     Comments: The right basilar rales.  Abdominal:     General: There is no distension.     Palpations: Abdomen is soft.     Tenderness: There is no abdominal tenderness. There is no left CVA tenderness, guarding or rebound.  Musculoskeletal:     Right lower leg: No edema.     Left lower leg: No edema.     Comments: Furniture walking/walker.   Skin:    General: Skin is warm and dry.  Neurological:     General: No focal deficit present.     Mental Status: She is alert and oriented to person, place, and time. Mental status is at baseline.     Cranial Nerves: No cranial nerve  deficit.     Motor: No weakness.     Coordination: Coordination normal.     Gait: Gait abnormal.     Comments: Oriented to person and place.   Psychiatric:        Mood and Affect: Mood normal.        Behavior: Behavior normal.  Labs reviewed: Recent Labs    03/30/18 1327 03/31/18 0406 04/01/18 0349 04/26/18 05/02/18 06/09/18  NA 128* 134* 135 132* 136* 141  K 3.0* 4.9 3.8 4.0 4.6 3.9  CL 93* 102 107 95 99 104  CO2 26 20* 23 30 32 32  GLUCOSE 107* 153* 102*  --   --   --   BUN 5* 6 9 15 10 14   CREATININE 0.64 0.76 0.64 0.8 0.8 0.7  CALCIUM 8.4* 8.5* 7.7* 8.0 8.9 8.4  MG  --  1.8  --   --   --   --    Recent Labs    03/04/18 1034  03/30/18 1327 04/26/18 06/09/18  AST 37*   < > 46* 28 23  ALT 35   < > 34 20 17  ALKPHOS 92   < > 69 70 50  BILITOT 0.6  --  0.7  --   --   PROT 6.6  --  6.1* 5.1 5.0  ALBUMIN 3.3*   < > 3.0* 3.0 2.9   < > = values in this interval not displayed.   Recent Labs    03/04/18 1034  03/30/18 1327 03/31/18 0406 04/01/18 0349 04/26/18 06/09/18  WBC 5.8   < > 8.8 8.2 9.5 10.0 6.6  NEUTROABS 4.0  --  6.1  --   --   --   --   HGB 12.1   < > 12.3 12.5 10.8* 11.4* 11.6*  HCT 37.1   < > 36.4 37.2 32.6* 34* 35*  MCV 85.9  --  81.8 81.6 83.0  --   --   PLT 198   < > 188 247 223 284 304   < > = values in this interval not displayed.   Lab Results  Component Value Date   TSH 0.719 03/31/2018   Lab Results  Component Value Date   HGBA1C 6.1 (H) 02/09/2018   Lab Results  Component Value Date   CHOL 194 07/25/2007   HDL 62.7 07/25/2007   LDLCALC 118 (H) 07/25/2007   TRIG 69 07/25/2007   CHOLHDL 3.1 CALC 07/25/2007    Significant Diagnostic Results in last 30 days:  No results found.  Assessment/Plan Anxiety and depression mood is stable, continue  Lorazepam 0.5mg  bid, Mirtazapine 15mg  qd, Quetiapine 25mg  qd.  Giant cell arteritis stable, continue Prednisone 5mg  qd.  GERD (gastroesophageal reflux disease) Stable, continue  Omeprazole 20mg  qd.   Essential hypertension Controlled blood pressure, continue Metoprolol 50mg  qd.   Weight gain About #4Ibs in the past month, no apparent fluid retention, f/u dietitian, may consider GDR of psychotropic meds if tolerated.      Family/ staff Communication: plan of care reviewed with the patient and charge nurse.   Labs/tests ordered:  none  Time spend 25 minutes.

## 2019-02-27 NOTE — Assessment & Plan Note (Signed)
stable, continue Prednisone 5mg  qd.

## 2019-02-27 NOTE — Assessment & Plan Note (Signed)
Controlled blood pressure, continue Metoprolol 50mg  qd.

## 2019-02-27 NOTE — Assessment & Plan Note (Signed)
Stable, continue Omeprazole 20mg qd.  

## 2019-02-27 NOTE — Assessment & Plan Note (Signed)
About #4Ibs in the past month, no apparent fluid retention, f/u dietitian, may consider GDR of psychotropic meds if tolerated.

## 2019-02-27 NOTE — Progress Notes (Signed)
Opened in error; Disregard.

## 2019-02-27 NOTE — Assessment & Plan Note (Signed)
mood is stable, continue  Lorazepam 0.5mg  bid, Mirtazapine 15mg  qd, Quetiapine 25mg  qd.

## 2019-03-14 ENCOUNTER — Other Ambulatory Visit: Payer: Self-pay | Admitting: *Deleted

## 2019-03-14 ENCOUNTER — Other Ambulatory Visit: Payer: Self-pay | Admitting: Internal Medicine

## 2019-03-14 DIAGNOSIS — F419 Anxiety disorder, unspecified: Secondary | ICD-10-CM

## 2019-03-14 DIAGNOSIS — F329 Major depressive disorder, single episode, unspecified: Secondary | ICD-10-CM

## 2019-03-14 MED ORDER — LORAZEPAM 0.5 MG PO TABS: ORAL_TABLET | ORAL | 0 refills | Status: DC

## 2019-03-14 NOTE — Telephone Encounter (Signed)
[facility] fax this order to our office today.

## 2019-03-14 NOTE — Telephone Encounter (Signed)
Received fax request from [facility]. Pended Rx and sent to Dr. Lyndel Safe for approval.

## 2019-04-11 ENCOUNTER — Encounter: Payer: Self-pay | Admitting: Internal Medicine

## 2019-04-11 ENCOUNTER — Non-Acute Institutional Stay (SKILLED_NURSING_FACILITY): Payer: Medicare Other | Admitting: Internal Medicine

## 2019-04-11 DIAGNOSIS — C7951 Secondary malignant neoplasm of bone: Secondary | ICD-10-CM | POA: Diagnosis not present

## 2019-04-11 DIAGNOSIS — C50919 Malignant neoplasm of unspecified site of unspecified female breast: Secondary | ICD-10-CM

## 2019-04-11 DIAGNOSIS — F0391 Unspecified dementia with behavioral disturbance: Secondary | ICD-10-CM | POA: Diagnosis not present

## 2019-04-11 DIAGNOSIS — M316 Other giant cell arteritis: Secondary | ICD-10-CM

## 2019-04-11 NOTE — Progress Notes (Signed)
Location:  [facility] Room Number: 103A Place of Service:  SNF [phone]) Provider:  Dr. Veleta Miners   [name], [name], NP  Patient Care Team: [name], [name], NP as PCP - General (Internal Medicine) Lauren Skates, MD as Consulting Physician (General Surgery) Lauren, Lauren Dad, MD as Consulting Physician (Oncology) [name], MD as Consulting Physician (Radiation Oncology) Rockwell Germany, RN as Registered Nurse Mauro Kaufmann, RN as Registered Nurse Gentry Fitz, MD as Consulting Physician (Family Medicine) [name], [name], NP as Nurse Practitioner (Internal Medicine) Thompson Grayer, MD as Consulting Physician (Cardiology) Lauren Dad, MD as Consulting Physician (Internal Medicine)  Extended Emergency Contact Information Primary Emergency Contact: Matthews,[name] Address: [address]          [place], [place] [address] [place] Phone: [phone] Relation: Daughter Secondary Emergency Contact: Clent Demark [place] Phone: [phone] Relation: Other  Code Status:  DNR Goals of care: Advanced Directive information Advanced Directives 04/11/2019  Does Patient Have a Medical Advance Directive? Yes  Type of Paramedic of [place];Out of facility DNR (pink MOST or yellow form)  Does patient want to make changes to medical advance directive? No - Patient declined  Copy of [place] in Chart? Yes - validated most recent copy scanned in chart (See row information)  Would patient like information on creating a medical advance directive? -  Pre-existing out of facility DNR order (yellow form or pink MOST form) Yellow form placed in chart (order not valid for inpatient use);Pink MOST form placed in chart (order not valid for inpatient use)     Chief Complaint  Patient presents with  . Medical Management of Chronic Issues    Routine visit    HPI:  Pt is a 85 y.o.  female seen today for medical management of chronic diseases.   Patient has h/o Bilateral Breast Cancer with Metastatic to Bone, ? Atrial Fibrillation , Hypertension, COPD, S/P Pacemaker for Sick Sinus, Giant Cell arteritis  Patient is under hospice care was taken off the chemotherapy.  Since then actually patient had done well and has gained another 10 pounds since my last visit.  She denies any pain or any kind of her discomfort.  She has cognitive impairment and is unable to give detailed history.  But denies any cough shortness of breath.  She walked without any walker. No new nursing issues  Past Medical History:  Diagnosis Date  . Asthma    "as a child"  . Breast cancer ([place]) 12/08/2014   Bilateral  . Cancer of central portion of female breast ([place]) 12/04/2014  . Cancer of central portion of female breast ([place]) 12/04/2014  . Complete heart block ([place]) 1999   s/p PPM by Dr Olevia Perches, most recent generator change 2009  . Complication of anesthesia   . COPD (chronic obstructive pulmonary disease) ([place])   . Dementia ([place])   . Diverticulosis   . DJD (degenerative joint disease)    RA  . Esophageal stricture   . Family history of adverse reaction to anesthesia    Daughter N/V  . GERD (gastroesophageal reflux disease)   . Giant cell arteritis ([place])   . Goiter   . Hemorrhoids   . Hiatal hernia   . IBS (irritable bowel syndrome)   . Paroxysmal atrial fibrillation ([place]) 12/17/2015   asymptomatic, <1%, determined on regular pacemaker interrogation  . Pneumonia   . PONV (postoperative  nausea and vomiting)   . S/P radiation therapy 04/24/15 completed   T-11  . Wears glasses    Past Surgical History:  Procedure Laterality Date  . ANAL FISSURE REPAIR    . ARTERY BIOPSY Left 01/03/2013   Procedure: BIOPSY LEFT TEMPORAL ARTERY;  Surgeon: Ascencion Dike, MD;  Location: [facility];  Service: ENT;  Laterality: Left;  . BREAST BIOPSY Right   . CHOLECYSTECTOMY    . COLONOSCOPY    .  FRACTURE SURGERY     fx lt wrist  . HEMORRHOID SURGERY    . INSERT / REPLACE / [place]   Gen change ([facility]) by Dr Olevia Perches 2009  . IR FLUORO GUIDE PORT INSERTION RIGHT  12/22/2017  . IR US GUIDE VASC ACCESS RIGHT  12/22/2017  . LUNG REMOVAL, PARTIAL  1950   left lower lobectomy for brochiectasis   . MASTECTOMY Left 02/08/2018  . MOUTH SURGERY     patient unaware  . RECTAL POLYPECTOMY    . RENAL CYST EXCISION    . TONSILLECTOMY    . TOTAL MASTECTOMY Left 02/08/2018  . TOTAL MASTECTOMY Left 02/08/2018   Procedure: TOTAL MASTECTOMY LEFT;  Surgeon: Lauren Skates, MD;  Location: [facility];  Service: General;  Laterality: Left;    Allergies  Allergen Reactions  . Amoxicillin-Pot Clavulanate Other (See Comments)    Upset stomach Has patient had a PCN reaction causing immediate rash, facial/tongue/throat swelling, SOB or lightheadedness with hypotension: No Has patient had a PCN reaction causing severe rash involving mucus membranes or skin necrosis: No Has patient had a PCN reaction that required hospitalization: No Has patient had a PCN reaction occurring within the last 10 years: No If all of the above answers are "NO", then may proceed with Cephalosporin use.   Marland Kitchen Morphine And Related Nausea Only    Outpatient Encounter Medications as of 04/11/2019  Medication Sig  . LORazepam (ATIVAN) 0.5 MG tablet Take one tablet by mouth twice daily at 8:00am  and 5pm for anxiety. Hold for sedation  . magnesium hydroxide (MILK OF MAGNESIA) 400 MG/5ML suspension Take 30 mLs by mouth daily as needed for mild constipation.  . Menthol, Topical Analgesic, (BIOFREEZE) 4 % GEL Apply small amt. to the back of the neck and down the to the mid back. Apply Biofreeze to neck, shoulders and upper back PRN for pain four times a day  . metoprolol succinate (TOPROL-XL) 50 MG 24 hr tablet Take 50 mg by mouth daily. Take with or immediately following a meal.  . mirtazapine (REMERON) 15 MG tablet Take 15 mg by  mouth at bedtime.   Marland Kitchen omeprazole (PRILOSEC) 20 MG capsule Take 20 mg by mouth daily.  . ondansetron (ZOFRAN) 4 MG tablet Take 4 mg by mouth every 8 (eight) hours as needed for nausea or vomiting.   . predniSONE (DELTASONE) 5 MG tablet Take 5 mg by mouth daily with breakfast.  . QUEtiapine (SEROQUEL) 25 MG tablet Take 25 mg by mouth at bedtime.  . sennosides-docusate sodium (SENOKOT-S) 8.6-50 MG tablet Take 1 tablet by mouth at bedtime.   No facility-administered encounter medications on file as of 04/11/2019.     Review of Systems  Review of Systems  Constitutional: Negative for activity change, appetite change, chills, diaphoresis, fatigue and fever.  HENT: Negative for mouth sores, postnasal drip, rhinorrhea, sinus pain and sore throat.   Respiratory: Negative for apnea, cough, chest tightness, shortness of breath and wheezing.   Cardiovascular: Negative  for chest pain, palpitations and leg swelling.  Gastrointestinal: Negative for abdominal distention, abdominal pain, constipation, diarrhea, nausea and vomiting.  Genitourinary: Negative for dysuria and frequency.  Musculoskeletal: Negative for arthralgias, joint swelling and myalgias.  Skin: Negative for rash.  Neurological: Negative for dizziness, syncope, weakness, light-headedness and numbness.  Psychiatric/Behavioral: Negative for behavioral problems, confusion and sleep disturbance.     Immunization History  Administered Date(s) Administered  . Influenza Split 07/24/2011, 06/20/2012  . Influenza Whole 10/19/2001, 07/25/2007, 08/02/2008, 07/18/2009, 07/10/2010, 07/21/2018  . Influenza,inj,Quad PF,6+ Mos 07/18/2013, 08/27/2014  . Influenza-Unspecified 07/31/2015, 08/19/2016, 08/09/2017  . Pneumococcal Conjugate-13 08/16/2017  . Pneumococcal Polysaccharide-23 10/19/1997, 01/17/2010  . Td 10/19/1996, 07/25/2007   Pertinent  Health Maintenance Due  Topic Date Due  . INFLUENZA VACCINE  05/20/2019  . DEXA SCAN  Completed  . PNA  vac Low Risk Adult  Completed   Fall Risk  07/05/2018 07/01/2017 02/22/2017 05/22/2015 03/22/2015  Falls in the past year? No No No No No   Functional Status Survey:    Vitals:   04/11/19 1447  BP: 122/68  Pulse: 68  Resp: 20  Temp: 98.7 F (37.1 C)  TempSrc: Oral  SpO2: 98%  Weight: 125 lb 6.4 oz (56.9 kg)  Height: 5\' 1"  (1.549 m)   Body mass index is 23.69 kg/m. Physical Exam Constitutional: . Well-developed and well-nourished.  HENT:  Head: Normocephalic.  Mouth/Throat: Oropharynx is clear and moist.  Eyes: Pupils are equal, round, and reactive to light.  Neck: Neck supple.  Cardiovascular: Normal rate and normal heart sounds.  No murmur heard. Pulmonary/Chest: Effort normal and breath sounds normal. No respiratory distress. No wheezes. She has no rales.  Abdominal: Soft. Bowel sounds are normal. No distension. There is no tenderness. There is no rebound.  Musculoskeletal: No edema.  Lymphadenopathy: none Neurological: No focal deficits Walks without Walker Skin: Skin is warm and dry.  Psychiatric: Normal mood and affect. Behavior is normal. Thought content normal.   Labs reviewed: Recent Labs    04/26/18 05/02/18 06/09/18  NA 132* 136* 141  K 4.0 4.6 3.9  CL 95 99 104  CO2 30 32 32  BUN 15 10 14   CREATININE 0.8 0.8 0.7  CALCIUM 8.0 8.9 8.4   Recent Labs    04/26/18 06/09/18  AST 28 23  ALT 20 17  ALKPHOS 70 50  PROT 5.1 5.0  ALBUMIN 3.0 2.9   Recent Labs    04/26/18 06/09/18  WBC 10.0 6.6  HGB 11.4* 11.6*  HCT 34* 35*  PLT 284 304   Lab Results  Component Value Date   TSH 0.719 03/31/2018   Lab Results  Component Value Date   HGBA1C 6.1 (H) 02/09/2018   Lab Results  Component Value Date   CHOL 194 07/25/2007   HDL 62.7 07/25/2007   LDLCALC 118 (H) 07/25/2007   TRIG 69 07/25/2007   CHOLHDL 3.1 CALC 07/25/2007    Significant Diagnostic Results in last 30 days:   Assessment/Plan Metastatic breast Cancer Under hospice care Does not look  in any distress PAF On Metoprolol Not on any ANticoagulation Anxiety With Depression Continue on Remeron and Seroquel and Ativan Not candidate for GDR  GERD stable on Prilosec Giant Cell Arteritis On Chronic Prednisone Probably helps with her Cancer burden  Family/ staff Communication:   Labs/tests ordered:  No Labs as she is Hospice  Total time spent in this patient care encounter was  25_  minutes; greater than 50% of the visit spent counseling  patient and staff, reviewing records , Labs and coordinating care for problems addressed at this encounter.

## 2019-04-26 ENCOUNTER — Encounter: Payer: Self-pay | Admitting: Nurse Practitioner

## 2019-04-26 ENCOUNTER — Non-Acute Institutional Stay (SKILLED_NURSING_FACILITY): Payer: Medicare Other | Admitting: Nurse Practitioner

## 2019-04-26 DIAGNOSIS — I442 Atrioventricular block, complete: Secondary | ICD-10-CM

## 2019-04-26 DIAGNOSIS — Z950 Presence of cardiac pacemaker: Secondary | ICD-10-CM

## 2019-04-26 DIAGNOSIS — F419 Anxiety disorder, unspecified: Secondary | ICD-10-CM | POA: Diagnosis not present

## 2019-04-26 DIAGNOSIS — F329 Major depressive disorder, single episode, unspecified: Secondary | ICD-10-CM | POA: Diagnosis not present

## 2019-04-26 MED ORDER — LORAZEPAM 0.5 MG PO TABS: 0.5000 mg | ORAL_TABLET | ORAL | 1 refills | Status: DC | PRN

## 2019-04-26 MED ORDER — MORPHINE SULFATE (CONCENTRATE) 20 MG/ML PO SOLN: 5.0000 mg | ORAL | 0 refills | Status: AC | PRN

## 2019-04-26 MED ORDER — LORAZEPAM 1 MG PO TABS: 1.0000 mg | ORAL_TABLET | ORAL | 0 refills | Status: AC | PRN

## 2019-04-26 NOTE — Addendum Note (Signed)
Addended by: Dorothea Ogle X on: 04/26/2019 04:44 PM   Modules accepted: Orders

## 2019-04-26 NOTE — Assessment & Plan Note (Signed)
her mood is stable, continue  Quetiapine 25mg  qd, Mirtazapine 15mg  qd, Lorazepam 0.5mg  bid. Adding Lorazepam 0.5mg  q4h prn in setting of failure of pacemaker.

## 2019-04-26 NOTE — Assessment & Plan Note (Signed)
Battery has expired. The patient declined battery replacement.

## 2019-04-26 NOTE — Assessment & Plan Note (Addendum)
slow heart beats 36 bpm, the patient admitted generalized weakness, denied chest pain/pressure, palpitation, SOB, or sense of impending doom. No O2 desaturation. She is afebrile. Hx of Afib, complete AVB, s/p pacemaker, the patient declined replacing battery 07/2018, dc Metoprolol 50mg  qd. Supportive measures.  4:30Pm. The patient crying out for pain, noted respiratory distress, anxious, will dc all previous po meds, start Morphine 5mg  q2h prn, Lorazepam 1mg  q2hr prn for comfort measures, Hospice service if desires.

## 2019-04-26 NOTE — Progress Notes (Addendum)
Location:   [facility] Room Number: 528 UXLKG of Service:  SNF (31) Provider: Lennie Odor Jennefer Kopp NP  Selda Jalbert X, NP  Patient Care Team: Karely Hurtado X, NP as PCP - General (Internal Medicine) Fanny Skates, MD as Consulting Physician (General Surgery) [name], [name], MD as Consulting Physician (Oncology) [name], MD as Consulting Physician (Radiation Oncology) Rockwell Germany, RN as Registered Nurse Mauro Kaufmann, RN as Registered Nurse Gentry Fitz, MD as Consulting Physician (Family Medicine) Brit Wernette X, NP as Nurse Practitioner (Internal Medicine) Thompson Grayer, MD as Consulting Physician (Cardiology) [name], MD as Consulting Physician (Internal Medicine)  Extended Emergency Contact Information Primary Emergency Contact: Matthews,[name] Address: [address]          [place], [place] [address] [place] Mobile Phone: [phone] Relation: Daughter Secondary Emergency Contact: Clent Demark [place] Phone: [phone] Relation: Other  Code Status: DNR Goals of care: Advanced Directive information Advanced Directives 04/11/2019  Does Patient Have a Medical Advance Directive? Yes  Type of [facility] of [place];Out of facility DNR (pink MOST or yellow form)  Does patient want to make changes to medical advance directive? No - Patient declined  Copy of [place] in Chart? Yes - validated most recent copy scanned in chart (See row information)  Would patient like information on creating a medical advance directive? -  Pre-existing out of facility DNR order (yellow form or pink MOST form) Yellow form placed in chart (order not valid for inpatient use);Pink MOST form placed in chart (order not valid for inpatient use)     Chief Complaint  Patient presents with  . Acute Visit    slow heart beats, generalized weakness    HPI:  Pt is a 85 y.o. female seen today for  an acute visit for slow heart beats 36 bpm, the patient admitted generalized weakness, denied chest pain/pressure, palpitation, SOB, or sense of impending doom. No O2 desaturation. She is afebrile. Hx of Afib, complete AVB, s/p pacemaker, the patient declined replacing battery 07/2018, on Metoprolol 50mg  qd. The patient's daughter is informed and she is on way to visit her mother. Hx of depression/anxiety, her mood is stable on Quetiapine 25mg  qd, Mirtazapine 15mg  qd, Lorazepam 0.5mg  bid.    Past Medical History:  Diagnosis Date  . Asthma    "as a child"  . Breast cancer ([facility]) 12/08/2014   Bilateral  . Cancer of central portion of female breast ([facility]) 12/04/2014  . Cancer of central portion of female breast ([facility]) 12/04/2014  . Complete heart block ([facility]) 1999   s/p PPM by Dr Olevia Perches, most recent generator change 2009  . Complication of anesthesia   . COPD (chronic obstructive pulmonary disease) ([facility])   . Dementia ([facility])   . Diverticulosis   . DJD (degenerative joint disease)    RA  . Esophageal stricture   . Family history of adverse reaction to anesthesia    Daughter N/V  . GERD (gastroesophageal reflux disease)   . Giant cell arteritis ([facility])   . Goiter   . Hemorrhoids   . Hiatal hernia   . IBS (irritable bowel syndrome)   . Paroxysmal atrial fibrillation ([facility]) 12/17/2015   asymptomatic, <1%, determined on regular pacemaker interrogation  . Pneumonia   . PONV (postoperative nausea and vomiting)   . S/P radiation therapy 04/24/15 completed   T-11  . Wears glasses    Past Surgical History:  Procedure Laterality Date  . ANAL FISSURE REPAIR    . ARTERY BIOPSY Left 01/03/2013   Procedure: BIOPSY LEFT TEMPORAL ARTERY;  Surgeon: Ascencion Dike, MD;  Location: [facility];  Service: ENT;  Laterality: Left;  . BREAST BIOPSY Right   . CHOLECYSTECTOMY    . COLONOSCOPY    . FRACTURE SURGERY     fx lt wrist  . HEMORRHOID SURGERY    . INSERT / REPLACE / [facility]   Gen  change ([facility]) by Dr Olevia Perches 2009  . IR FLUORO GUIDE PORT INSERTION RIGHT  12/22/2017  . IR US GUIDE VASC ACCESS RIGHT  12/22/2017  . LUNG REMOVAL, PARTIAL  1950   left lower lobectomy for brochiectasis   . MASTECTOMY Left 02/08/2018  . MOUTH SURGERY     patient unaware  . RECTAL POLYPECTOMY    . RENAL CYST EXCISION    . TONSILLECTOMY    . TOTAL MASTECTOMY Left 02/08/2018  . TOTAL MASTECTOMY Left 02/08/2018   Procedure: TOTAL MASTECTOMY LEFT;  Surgeon: Fanny Skates, MD;  Location: [facility];  Service: General;  Laterality: Left;    Allergies  Allergen Reactions  . Amoxicillin-Pot Clavulanate Other (See Comments)    Upset stomach Has patient had a PCN reaction causing immediate rash, facial/tongue/throat swelling, SOB or lightheadedness with hypotension: No Has patient had a PCN reaction causing severe rash involving mucus membranes or skin necrosis: No Has patient had a PCN reaction that required hospitalization: No Has patient had a PCN reaction occurring within the last 10 years: No If all of the above answers are "NO", then may proceed with Cephalosporin use.   Marland Kitchen Morphine And Related Nausea Only    Allergies as of 04/26/2019      Reactions   Amoxicillin-pot Clavulanate Other (See Comments)   Upset stomach Has patient had a PCN reaction causing immediate rash, facial/tongue/throat swelling, SOB or lightheadedness with hypotension: No Has patient had a PCN reaction causing severe rash involving mucus membranes or skin necrosis: No Has patient had a PCN reaction that required hospitalization: No Has patient had a PCN reaction occurring within the last 10 years: No If all of the above answers are "NO", then may proceed with Cephalosporin use.   Morphine And Related Nausea Only      Medication List       Accurate as of April 26, 2019  4:44 PM. If you have any questions, ask your nurse or doctor.        STOP taking these medications   Biofreeze 4 % Gel Generic drug: Menthol (Topical  Analgesic) Stopped by: Arista Kettlewell X Molly Maselli, NP   magnesium hydroxide 400 MG/5ML suspension Commonly known as: MILK OF MAGNESIA Stopped by: Brentlee Delage X Mare Ludtke, NP   metoprolol succinate 50 MG 24 hr tablet Commonly known as: TOPROL-XL Stopped by: Aireanna Luellen X Jirah Rider, NP   mirtazapine 15 MG tablet Commonly known as: REMERON Stopped by: Miela Desjardin X Justinn Welter, NP   omeprazole 20 MG capsule Commonly known as: PRILOSEC Stopped by: Tatyanna Cronk X Deysha Cartier, NP   ondansetron 4 MG tablet Commonly known as: ZOFRAN Stopped by: Lateisha Thurlow X Taheerah Guldin, NP   predniSONE 5 MG tablet Commonly known as: DELTASONE Stopped by: Anabel Lykins X Vinicio Lynk, NP   sennosides-docusate sodium 8.6-50 MG tablet Commonly known as: SENOKOT-S Stopped by: Charl Wellen X Lavaeh Bau, NP     TAKE these medications   LORazepam 1 MG tablet Commonly known as: ATIVAN Take 1 tablet (1 mg total) by mouth every 2 (  two) hours as needed for up to 14 days for anxiety. What changed:   medication strength  how much to take  how to take this  when to take this  reasons to take this  additional instructions Changed by: Clarkson Rosselli X Dandria Griego, NP   morphine 20 MG/ML concentrated solution Commonly known as: ROXANOL Take 0.25 mLs (5 mg total) by mouth every 2 (two) hours as needed for severe pain, anxiety or shortness of breath. Started by: Kiaira Pointer X Reygan Heagle, NP   QUEtiapine 25 MG tablet Commonly known as: SEROQUEL Take 25 mg by mouth at bedtime.       ROS was provided with assistance of staff.  Review of Systems  Constitutional: Positive for activity change, appetite change and fatigue. Negative for chills, diaphoresis and fever.  HENT: Positive for hearing loss. Negative for congestion and voice change.   Respiratory: Negative for cough, shortness of breath and wheezing.   Cardiovascular: Negative for chest pain, palpitations and leg swelling.  Gastrointestinal: Negative for abdominal distention and constipation.  Genitourinary: Negative for difficulty urinating, dysuria and urgency.  Musculoskeletal:  Positive for gait problem.  Skin: Negative for color change and pallor.  Neurological: Negative for dizziness, syncope, speech difficulty, light-headedness and headaches.       Memory lapse.   Psychiatric/Behavioral: Positive for dysphoric mood. Negative for agitation, behavioral problems, hallucinations and sleep disturbance. The patient is not nervous/anxious.     Immunization History  Administered Date(s) Administered  . Influenza Split 07/24/2011, 06/20/2012  . Influenza Whole 10/19/2001, 07/25/2007, 08/02/2008, 07/18/2009, 07/10/2010, 07/21/2018  . Influenza,inj,Quad PF,6+ Mos 07/18/2013, 08/27/2014  . Influenza-Unspecified 07/31/2015, 08/19/2016, 08/09/2017  . Pneumococcal Conjugate-13 08/16/2017  . Pneumococcal Polysaccharide-23 10/19/1997, 01/17/2010  . Td 10/19/1996, 07/25/2007   Pertinent  Health Maintenance Due  Topic Date Due  . INFLUENZA VACCINE  05/20/2019  . DEXA SCAN  Completed  . PNA vac Low Risk Adult  Completed   Fall Risk  07/05/2018 07/01/2017 02/22/2017 05/22/2015 03/22/2015  Falls in the past year? No No No No No   Functional Status Survey:    Vitals:   04/26/19 1204  BP: (!) 118/50  Pulse: (!) 36  Resp: 16  Temp: 98.9 F (37.2 C)  SpO2: 95%   There is no height or weight on file to calculate BMI. Physical Exam Vitals signs and nursing note reviewed.  Constitutional:      Appearance: Normal appearance.  HENT:     Head: Normocephalic and atraumatic.     Nose: Nose normal.     Mouth/Throat:     Mouth: Mucous membranes are moist.  Eyes:     Extraocular Movements: Extraocular movements intact.     Conjunctiva/sclera: Conjunctivae normal.     Pupils: Pupils are equal, round, and reactive to light.  Neck:     Musculoskeletal: Normal range of motion and neck supple.  Cardiovascular:     Rate and Rhythm: Regular rhythm. Bradycardia present.     Heart sounds: Murmur present.     Comments: Pacemaker.  Pulmonary:     Effort: Pulmonary effort is normal.      Breath sounds: No wheezing, rhonchi or rales.  Abdominal:     General: Bowel sounds are normal.     Palpations: Abdomen is soft.     Tenderness: There is no abdominal tenderness. There is no right CVA tenderness, left CVA tenderness, guarding or rebound.  Musculoskeletal:     Right lower leg: No edema.     Left lower leg: No edema.  Skin:    General: Skin is warm and dry.     Coloration: Skin is not pale.  Neurological:     General: No focal deficit present.     Mental Status: She is alert. Mental status is at baseline.     Cranial Nerves: No cranial nerve deficit.     Sensory: No sensory deficit.     Motor: No weakness.     Coordination: Coordination normal.     Gait: Gait abnormal.     Comments: Oriented to person and place.   Psychiatric:        Mood and Affect: Mood normal.        Behavior: Behavior normal.        Thought Content: Thought content normal.     Labs reviewed: Recent Labs    05/02/18 06/09/18  NA 136* 141  K 4.6 3.9  CL 99 104  CO2 32 32  BUN 10 14  CREATININE 0.8 0.7  CALCIUM 8.9 8.4   Recent Labs    06/09/18  AST 23  ALT 17  ALKPHOS 50  PROT 5.0  ALBUMIN 2.9   Recent Labs    06/09/18  WBC 6.6  HGB 11.6*  HCT 35*  PLT 304   Lab Results  Component Value Date   TSH 0.719 03/31/2018   Lab Results  Component Value Date   HGBA1C 6.1 (H) 02/09/2018   Lab Results  Component Value Date   CHOL 194 07/25/2007   HDL 62.7 07/25/2007   LDLCALC 118 (H) 07/25/2007   TRIG 69 07/25/2007   CHOLHDL 3.1 CALC 07/25/2007    Significant Diagnostic Results in last 30 days:  No results found.  Assessment/Plan: AV BLOCK, COMPLETE slow heart beats 36 bpm, the patient admitted generalized weakness, denied chest pain/pressure, palpitation, SOB, or sense of impending doom. No O2 desaturation. She is afebrile. Hx of Afib, complete AVB, s/p pacemaker, the patient declined replacing battery 07/2018, dc Metoprolol 50mg  qd. Supportive measures.  4:30Pm.  The patient crying out for pain, noted respiratory distress, anxious, will dc all previous po meds, start Morphine 5mg  q2h prn, Lorazepam 1mg  q2hr prn for comfort measures, Hospice service if desires.   Anxiety and depression her mood is stable, continue  Quetiapine 25mg  qd, Mirtazapine 15mg  qd, Lorazepam 0.5mg  bid. Adding Lorazepam 0.5mg  q4h prn in setting of failure of pacemaker.    PACEMAKER, PERMANENT Battery has expired. The patient declined battery replacement.     Family/ staff Communication: plan of care reviewed with the patient, the patient's daughter HPOA, and charge nurse.   Labs/tests ordered: none  Time spend 25 minutes.

## 2019-04-27 DIAGNOSIS — R55 Syncope and collapse: Secondary | ICD-10-CM | POA: Diagnosis not present

## 2019-04-27 DIAGNOSIS — J449 Chronic obstructive pulmonary disease, unspecified: Secondary | ICD-10-CM | POA: Diagnosis not present

## 2019-04-27 DIAGNOSIS — Z950 Presence of cardiac pacemaker: Secondary | ICD-10-CM | POA: Diagnosis not present

## 2019-04-27 DIAGNOSIS — W881XXD Exposure to radioactive isotopes, subsequent encounter: Secondary | ICD-10-CM | POA: Diagnosis not present

## 2019-04-27 DIAGNOSIS — J302 Other seasonal allergic rhinitis: Secondary | ICD-10-CM | POA: Diagnosis not present

## 2019-04-27 DIAGNOSIS — C50911 Malignant neoplasm of unspecified site of right female breast: Secondary | ICD-10-CM | POA: Diagnosis not present

## 2019-04-27 DIAGNOSIS — K589 Irritable bowel syndrome without diarrhea: Secondary | ICD-10-CM | POA: Diagnosis not present

## 2019-04-27 DIAGNOSIS — K219 Gastro-esophageal reflux disease without esophagitis: Secondary | ICD-10-CM | POA: Diagnosis not present

## 2019-04-27 DIAGNOSIS — G9340 Encephalopathy, unspecified: Secondary | ICD-10-CM | POA: Diagnosis not present

## 2019-04-27 DIAGNOSIS — N183 Chronic kidney disease, stage 3 (moderate): Secondary | ICD-10-CM | POA: Diagnosis not present

## 2019-04-27 DIAGNOSIS — D630 Anemia in neoplastic disease: Secondary | ICD-10-CM | POA: Diagnosis not present

## 2019-04-27 DIAGNOSIS — R1310 Dysphagia, unspecified: Secondary | ICD-10-CM | POA: Diagnosis not present

## 2019-04-27 DIAGNOSIS — C50912 Malignant neoplasm of unspecified site of left female breast: Secondary | ICD-10-CM | POA: Diagnosis not present

## 2019-04-27 DIAGNOSIS — Z9012 Acquired absence of left breast and nipple: Secondary | ICD-10-CM | POA: Diagnosis not present

## 2019-04-27 DIAGNOSIS — I4891 Unspecified atrial fibrillation: Secondary | ICD-10-CM | POA: Diagnosis not present

## 2019-04-27 DIAGNOSIS — C7951 Secondary malignant neoplasm of bone: Secondary | ICD-10-CM | POA: Diagnosis not present

## 2019-04-27 DIAGNOSIS — J700 Acute pulmonary manifestations due to radiation: Secondary | ICD-10-CM | POA: Diagnosis not present

## 2019-04-27 DIAGNOSIS — I442 Atrioventricular block, complete: Secondary | ICD-10-CM | POA: Diagnosis not present

## 2019-04-28 DIAGNOSIS — D630 Anemia in neoplastic disease: Secondary | ICD-10-CM | POA: Diagnosis not present

## 2019-04-28 DIAGNOSIS — C50911 Malignant neoplasm of unspecified site of right female breast: Secondary | ICD-10-CM | POA: Diagnosis not present

## 2019-04-28 DIAGNOSIS — C50912 Malignant neoplasm of unspecified site of left female breast: Secondary | ICD-10-CM | POA: Diagnosis not present

## 2019-04-28 DIAGNOSIS — C7951 Secondary malignant neoplasm of bone: Secondary | ICD-10-CM | POA: Diagnosis not present

## 2019-04-28 DIAGNOSIS — I442 Atrioventricular block, complete: Secondary | ICD-10-CM | POA: Diagnosis not present

## 2019-04-28 DIAGNOSIS — R55 Syncope and collapse: Secondary | ICD-10-CM | POA: Diagnosis not present

## 2019-05-20 DEATH — deceased
